# Patient Record
Sex: Female | Born: 1950
Health system: Southern US, Community
[De-identification: ages and names within clinical notes are randomized; demographics above are authoritative.]

## PROBLEM LIST (undated history)

## (undated) ENCOUNTER — Emergency Department (HOSPITAL_COMMUNITY): Admission: EM | Payer: Medicare HMO

## (undated) DIAGNOSIS — IMO0001 Reserved for inherently not codable concepts without codable children: Secondary | ICD-10-CM

## (undated) DIAGNOSIS — I219 Acute myocardial infarction, unspecified: Secondary | ICD-10-CM

## (undated) DIAGNOSIS — L729 Follicular cyst of the skin and subcutaneous tissue, unspecified: Secondary | ICD-10-CM

## (undated) DIAGNOSIS — J449 Chronic obstructive pulmonary disease, unspecified: Secondary | ICD-10-CM

## (undated) DIAGNOSIS — Q282 Arteriovenous malformation of cerebral vessels: Secondary | ICD-10-CM

## (undated) DIAGNOSIS — E785 Hyperlipidemia, unspecified: Secondary | ICD-10-CM

## (undated) DIAGNOSIS — K0889 Other specified disorders of teeth and supporting structures: Secondary | ICD-10-CM

## (undated) DIAGNOSIS — Z9189 Other specified personal risk factors, not elsewhere classified: Secondary | ICD-10-CM

## (undated) DIAGNOSIS — Z720 Tobacco use: Secondary | ICD-10-CM

## (undated) DIAGNOSIS — E039 Hypothyroidism, unspecified: Secondary | ICD-10-CM

## (undated) DIAGNOSIS — F419 Anxiety disorder, unspecified: Secondary | ICD-10-CM

## (undated) DIAGNOSIS — R42 Dizziness and giddiness: Secondary | ICD-10-CM

## (undated) DIAGNOSIS — R3989 Other symptoms and signs involving the genitourinary system: Secondary | ICD-10-CM

## (undated) DIAGNOSIS — R5383 Other fatigue: Secondary | ICD-10-CM

## (undated) DIAGNOSIS — K219 Gastro-esophageal reflux disease without esophagitis: Secondary | ICD-10-CM

## (undated) DIAGNOSIS — C801 Malignant (primary) neoplasm, unspecified: Secondary | ICD-10-CM

## (undated) DIAGNOSIS — S99929A Unspecified injury of unspecified foot, initial encounter: Secondary | ICD-10-CM

## (undated) DIAGNOSIS — R51 Headache: Secondary | ICD-10-CM

## (undated) DIAGNOSIS — I2510 Atherosclerotic heart disease of native coronary artery without angina pectoris: Secondary | ICD-10-CM

## (undated) DIAGNOSIS — L089 Local infection of the skin and subcutaneous tissue, unspecified: Secondary | ICD-10-CM

## (undated) DIAGNOSIS — R0600 Dyspnea, unspecified: Secondary | ICD-10-CM

## (undated) DIAGNOSIS — I10 Essential (primary) hypertension: Secondary | ICD-10-CM

## (undated) DIAGNOSIS — F32A Depression, unspecified: Secondary | ICD-10-CM

## (undated) DIAGNOSIS — R918 Other nonspecific abnormal finding of lung field: Secondary | ICD-10-CM

## (undated) DIAGNOSIS — Z5189 Encounter for other specified aftercare: Secondary | ICD-10-CM

## (undated) DIAGNOSIS — N390 Urinary tract infection, site not specified: Secondary | ICD-10-CM

## (undated) DIAGNOSIS — R58 Hemorrhage, not elsewhere classified: Secondary | ICD-10-CM

## (undated) HISTORY — DX: Arteriovenous malformation of cerebral vessels: Q28.2

## (undated) HISTORY — PX: WISDOM TOOTH EXTRACTION: SHX21

## (undated) HISTORY — DX: Other fatigue: R53.83

## (undated) HISTORY — PX: CORONARY STENT PLACEMENT: SHX1402

## (undated) HISTORY — DX: Tobacco use: Z72.0

## (undated) HISTORY — DX: Unspecified injury of unspecified foot, initial encounter: S99.929A

## (undated) HISTORY — DX: Urinary tract infection, site not specified: N39.0

## (undated) HISTORY — DX: Atherosclerotic heart disease of native coronary artery without angina pectoris: I25.10

## (undated) HISTORY — DX: Dizziness and giddiness: R42

## (undated) HISTORY — DX: Local infection of the skin and subcutaneous tissue, unspecified: L08.9

## (undated) HISTORY — DX: Hypothyroidism, unspecified: E03.9

## (undated) HISTORY — PX: COLONOSCOPY: SHX174

## (undated) HISTORY — DX: Follicular cyst of the skin and subcutaneous tissue, unspecified: L72.9

## (undated) HISTORY — PX: EYE SURGERY: SHX253

## (undated) HISTORY — DX: Reserved for inherently not codable concepts without codable children: IMO0001

## (undated) HISTORY — DX: Essential (primary) hypertension: I10

## (undated) HISTORY — DX: Other symptoms and signs involving the genitourinary system: R39.89

## (undated) HISTORY — DX: Anxiety disorder, unspecified: F41.9

## (undated) HISTORY — DX: Hyperlipidemia, unspecified: E78.5

## (undated) HISTORY — DX: Hemorrhage, not elsewhere classified: R58

## (undated) HISTORY — PX: UPPER GI ENDOSCOPY: SHX6162

## (undated) HISTORY — DX: Other specified disorders of teeth and supporting structures: K08.89

---

## 1987-11-07 HISTORY — PX: HEMORRHOID SURGERY: SHX153

## 2000-06-27 ENCOUNTER — Ambulatory Visit (HOSPITAL_COMMUNITY): Admission: RE | Admit: 2000-06-27 | Discharge: 2000-06-27 | Payer: Self-pay | Admitting: Internal Medicine

## 2000-06-27 ENCOUNTER — Encounter: Payer: Self-pay | Admitting: Internal Medicine

## 2000-07-02 ENCOUNTER — Encounter: Payer: Self-pay | Admitting: Internal Medicine

## 2000-07-02 ENCOUNTER — Encounter: Admission: RE | Admit: 2000-07-02 | Discharge: 2000-07-02 | Payer: Self-pay | Admitting: Internal Medicine

## 2006-05-16 ENCOUNTER — Inpatient Hospital Stay (HOSPITAL_COMMUNITY): Admission: EM | Admit: 2006-05-16 | Discharge: 2006-05-19 | Payer: Self-pay | Admitting: Emergency Medicine

## 2006-11-06 HISTORY — PX: CORONARY ARTERY BYPASS GRAFT: SHX141

## 2007-01-01 ENCOUNTER — Inpatient Hospital Stay (HOSPITAL_COMMUNITY): Admission: EM | Admit: 2007-01-01 | Discharge: 2007-01-03 | Payer: Self-pay | Admitting: Emergency Medicine

## 2007-01-02 HISTORY — PX: CARDIAC CATHETERIZATION: SHX172

## 2007-01-14 ENCOUNTER — Ambulatory Visit: Payer: Self-pay | Admitting: Thoracic Surgery (Cardiothoracic Vascular Surgery)

## 2007-02-04 ENCOUNTER — Ambulatory Visit: Payer: Self-pay | Admitting: Thoracic Surgery (Cardiothoracic Vascular Surgery)

## 2007-02-05 ENCOUNTER — Ambulatory Visit: Payer: Self-pay | Admitting: Thoracic Surgery (Cardiothoracic Vascular Surgery)

## 2007-02-06 ENCOUNTER — Inpatient Hospital Stay (HOSPITAL_COMMUNITY)
Admission: RE | Admit: 2007-02-06 | Discharge: 2007-02-11 | Payer: Self-pay | Admitting: Thoracic Surgery (Cardiothoracic Vascular Surgery)

## 2007-02-06 ENCOUNTER — Ambulatory Visit: Payer: Self-pay | Admitting: Thoracic Surgery (Cardiothoracic Vascular Surgery)

## 2007-03-04 ENCOUNTER — Ambulatory Visit: Payer: Self-pay | Admitting: Thoracic Surgery (Cardiothoracic Vascular Surgery)

## 2010-11-28 ENCOUNTER — Encounter: Payer: Self-pay | Admitting: Cardiovascular Disease

## 2011-02-28 ENCOUNTER — Other Ambulatory Visit: Payer: Self-pay | Admitting: Cardiovascular Disease

## 2011-03-01 ENCOUNTER — Telehealth: Payer: Self-pay | Admitting: *Deleted

## 2011-03-01 DIAGNOSIS — E039 Hypothyroidism, unspecified: Secondary | ICD-10-CM

## 2011-03-01 DIAGNOSIS — E785 Hyperlipidemia, unspecified: Secondary | ICD-10-CM

## 2011-03-01 DIAGNOSIS — I2510 Atherosclerotic heart disease of native coronary artery without angina pectoris: Secondary | ICD-10-CM

## 2011-03-01 MED ORDER — METOPROLOL TARTRATE 50 MG PO TABS: 50.0000 mg | ORAL_TABLET | Freq: Every day | ORAL | Status: DC

## 2011-03-01 NOTE — Telephone Encounter (Signed)
Been over one year since app. Pt called and Appointment scheduled with labs and ekg order, pt has no insurance and stated she will set up a payment plan. Fax received from pharmacy. Refill completed. Alfonso Ramus RN

## 2011-03-22 ENCOUNTER — Encounter: Payer: Self-pay | Admitting: Cardiovascular Disease

## 2011-03-22 DIAGNOSIS — R42 Dizziness and giddiness: Secondary | ICD-10-CM | POA: Insufficient documentation

## 2011-03-22 DIAGNOSIS — E785 Hyperlipidemia, unspecified: Secondary | ICD-10-CM | POA: Insufficient documentation

## 2011-03-22 DIAGNOSIS — R0602 Shortness of breath: Secondary | ICD-10-CM | POA: Insufficient documentation

## 2011-03-22 DIAGNOSIS — R110 Nausea: Secondary | ICD-10-CM | POA: Insufficient documentation

## 2011-03-22 DIAGNOSIS — I10 Essential (primary) hypertension: Secondary | ICD-10-CM | POA: Insufficient documentation

## 2011-03-22 DIAGNOSIS — E039 Hypothyroidism, unspecified: Secondary | ICD-10-CM | POA: Insufficient documentation

## 2011-03-22 DIAGNOSIS — R5383 Other fatigue: Secondary | ICD-10-CM | POA: Insufficient documentation

## 2011-03-22 DIAGNOSIS — R079 Chest pain, unspecified: Secondary | ICD-10-CM | POA: Insufficient documentation

## 2011-03-22 DIAGNOSIS — F419 Anxiety disorder, unspecified: Secondary | ICD-10-CM | POA: Insufficient documentation

## 2011-03-22 DIAGNOSIS — I2510 Atherosclerotic heart disease of native coronary artery without angina pectoris: Secondary | ICD-10-CM | POA: Insufficient documentation

## 2011-03-22 DIAGNOSIS — R61 Generalized hyperhidrosis: Secondary | ICD-10-CM | POA: Insufficient documentation

## 2011-03-23 ENCOUNTER — Encounter: Payer: Self-pay | Admitting: Cardiovascular Disease

## 2011-03-23 ENCOUNTER — Ambulatory Visit (INDEPENDENT_AMBULATORY_CARE_PROVIDER_SITE_OTHER): Payer: Self-pay | Admitting: Cardiovascular Disease

## 2011-03-23 ENCOUNTER — Other Ambulatory Visit (INDEPENDENT_AMBULATORY_CARE_PROVIDER_SITE_OTHER): Payer: Self-pay | Admitting: *Deleted

## 2011-03-23 VITALS — BP 152/90 | HR 69 | Ht 68.0 in | Wt 140.4 lb

## 2011-03-23 DIAGNOSIS — E785 Hyperlipidemia, unspecified: Secondary | ICD-10-CM

## 2011-03-23 DIAGNOSIS — F419 Anxiety disorder, unspecified: Secondary | ICD-10-CM

## 2011-03-23 DIAGNOSIS — I2510 Atherosclerotic heart disease of native coronary artery without angina pectoris: Secondary | ICD-10-CM

## 2011-03-23 DIAGNOSIS — F411 Generalized anxiety disorder: Secondary | ICD-10-CM

## 2011-03-23 DIAGNOSIS — E039 Hypothyroidism, unspecified: Secondary | ICD-10-CM

## 2011-03-23 MED ORDER — LOSARTAN POTASSIUM 100 MG PO TABS: 100.0000 mg | ORAL_TABLET | Freq: Every day | ORAL | Status: DC

## 2011-03-23 MED ORDER — POTASSIUM CHLORIDE ER 10 MEQ PO TBCR: 10.0000 meq | EXTENDED_RELEASE_TABLET | Freq: Every day | ORAL | Status: DC

## 2011-03-23 MED ORDER — SIMVASTATIN 80 MG PO TABS: 40.0000 mg | ORAL_TABLET | Freq: Every day | ORAL | Status: DC

## 2011-03-23 MED ORDER — HYDROCHLOROTHIAZIDE 25 MG PO TABS: 25.0000 mg | ORAL_TABLET | Freq: Every day | ORAL | Status: DC

## 2011-03-23 MED ORDER — NITROGLYCERIN 0.4 MG SL SUBL: 0.4000 mg | SUBLINGUAL_TABLET | SUBLINGUAL | Status: DC | PRN

## 2011-03-23 NOTE — Assessment & Plan Note (Signed)
She's been taking Xanax tablets that she buys from a friend. I refuse to Give her any Xanax.

## 2011-03-23 NOTE — Progress Notes (Signed)
Jill [name] Date of Birth  04/17/1951 [facility] [address]     [address] [place], [place]  [address] [phone]  Fax  [phone]  History of Present Illness:  Jill [name] is a middle-age female with a history of coronary artery disease. She status post CABG in 2000. She continues to smoke at least one to 2 packs of cigarettes a day. She takes Xanax that she buys off the street. She takes her medications as prescribed.  She's not been back to her medical Dr.  She complains of having left-sided chest pain. These episodes of chest pain are described as a stabbing-like sensation. They're not similar to a previous episodes of angina. She refuses to have a stress test. She states that she cannot afford to have a stress test.  She's under a lot of stress at home and thinks that these  episodes of chest pain may be related to her anxiety.  Current Outpatient Prescriptions on File Prior to Visit  Medication Sig Dispense Refill  . aspirin 81 MG tablet Take 81 mg by mouth daily.        . hydrochlorothiazide 25 MG tablet Take 25 mg by mouth daily.        Marland Kitchen levothyroxine (SYNTHROID, LEVOTHROID) 125 MCG tablet Take 100 mcg by mouth daily.       Marland Kitchen losartan (COZAAR) 100 MG tablet Take 100 mg by mouth daily.        . metoprolol (LOPRESSOR) 50 MG tablet TAKE ONE TABLET BY MOUTH EVERY DAY  30 tablet  0  . potassium chloride (K-DUR) 10 MEQ tablet Take 10 mEq by mouth daily.        . simvastatin (ZOCOR) 80 MG tablet Take 80 mg by mouth at bedtime.        Marland Kitchen DISCONTD: ALPRAZolam (XANAX) 0.5 MG tablet Take 0.5 mg by mouth at bedtime as needed.          Allergies  Allergen Reactions  . Ace Inhibitors   . Sulfa Drugs Cross Reactors     Past Medical History  Diagnosis Date  . Coronary artery disease   . Hyperlipidemia   . Hypertension   . Chest pain   . SOB (shortness of breath)   . Nausea   . Diaphoresis   . Fatigue   . Dizziness   . Anxiety   .  Hypothyroidism     Past Surgical History  Procedure Date  . Cardiac catheterization 01/02/2007    IT REVEALS MILD INFERIOR WALL HYPOKINESIS. THE EJECTION FRACTION IS AROUND 50%  . Coronary artery bypass graft 2000    History  Smoking status  . Current Everyday Smoker -- 2.0 packs/day  Smokeless tobacco  . Not on file    History  Alcohol Use No    History reviewed. No pertinent family history.  Reviw of Systems:  Reviewed in the HPI.  All other systems are negative.  Physical Exam: BP 152/90  Pulse 69  Ht 5\' 8"  (1.727 m)  Wt 140 lb 6.4 oz (63.685 kg)  BMI 21.35 kg/m2 The patient is alert and oriented x 3.  The mood and affect are normal.  The skin is warm and dry.  Color is normal.  The HEENT exam reveals that the sclera are nonicteric.  The mucous membranes are moist.  The carotids are 2+ without bruits.  There is no thyromegaly.  There is no JVD.  The lungs are clear.  The chest wall  is non tender.  The heart exam reveals a regular rate with a normal S1 and S2.  There are no murmurs, gallops, or rubs.  The PMI is not displaced.   Abdominal exam reveals good bowel sounds.  There is no guarding or rebound.  There is no hepatosplenomegaly or tenderness.  There are no masses.  Exam of the legs reveal no clubbing, cyanosis, or edema.  The legs are without rashes.  The distal pulses are intact.  Cranial nerves II - XII are intact.  Motor and sensory functions are intact.  The gait is normal.  ECG: Normal sinus rhythm. She has a nonspecific intraventricular conduction delay. She has a lateral myocardial infarction. Assessment / Plan:

## 2011-03-23 NOTE — Assessment & Plan Note (Signed)
Jill [name] continues to be somewhat noncompliant. She does take her medications but she continues to smoke. She refuses to have a stress test. She takes medicines that were prescribed to her.  I've refilled her prescription for nitroglycerin. I've asked her to call us if she has any recurrent episodes of chest pain. I've encouraged her to stop smoking which would help her physically but would also give her more spending money. I'll see her again in One year.

## 2011-03-23 NOTE — Assessment & Plan Note (Signed)
Jill [name] has a history of hypothyroidism. She was previously on 0.25 mg of Synthroid a day. She ran out of her Synthroid and started taking some of her husbands Jill [name] 3 tablets. I've told her that this was not a good way to manage her hypothyroidism. We will check a TSH today. She really needs to get a general medical Dr.

## 2011-03-24 LAB — TSH: TSH: 2.25 u[IU]/mL (ref 0.35–5.50)

## 2011-03-24 LAB — BASIC METABOLIC PANEL
BUN: 10 mg/dL (ref 6–23)
Calcium: 9.6 mg/dL (ref 8.4–10.5)
Creatinine, Ser: 1 mg/dL (ref 0.4–1.2)
GFR: 59.02 mL/min — ABNORMAL LOW (ref >60.00)
Glucose, Bld: 90 mg/dL (ref 70–99)
Sodium: 140 mEq/L (ref 135–145)

## 2011-03-24 LAB — HEPATIC FUNCTION PANEL
AST: 19 U/L (ref 0–37)
Alkaline Phosphatase: 43 U/L (ref 39–117)
Bilirubin, Direct: 0 mg/dL (ref 0.0–0.3)
Total Bilirubin: 0.3 mg/dL (ref 0.3–1.2)

## 2011-03-27 ENCOUNTER — Telehealth: Payer: Self-pay | Admitting: Cardiovascular Disease

## 2011-03-27 NOTE — Telephone Encounter (Signed)
Pt calling regarding her lab work-TSH level;  She states Dr. Elease Hashimoto will be monitoring and regulating her Thyroid levels.  Explained I would discuss with the doctor and call her tomorrow when he is back in the office.

## 2011-03-27 NOTE — Telephone Encounter (Signed)
Please call her with her most recent rest results.

## 2011-03-27 NOTE — Progress Notes (Signed)
Pt called and informed of potassium rich foods and to decrease these items.Patient called with lab results. Pt verbalized understanding. Alfonso Ramus RN

## 2011-03-28 ENCOUNTER — Other Ambulatory Visit: Payer: Self-pay | Admitting: *Deleted

## 2011-03-28 DIAGNOSIS — E039 Hypothyroidism, unspecified: Secondary | ICD-10-CM

## 2011-03-28 MED ORDER — LEVOTHYROXINE SODIUM 100 MCG PO TABS: 100.0000 ug | ORAL_TABLET | Freq: Every day | ORAL | Status: DC

## 2011-03-28 NOTE — Telephone Encounter (Signed)
Refill done, needs lab in 6wks, pt informed,Jill [name]/ranger

## 2011-04-17 ENCOUNTER — Telehealth: Payer: Self-pay | Admitting: Cardiovascular Disease

## 2011-04-17 MED ORDER — METOPROLOL TARTRATE 50 MG PO TABS: 50.0000 mg | ORAL_TABLET | Freq: Every day | ORAL | Status: DC

## 2011-04-17 NOTE — Telephone Encounter (Signed)
Called in wanting a refill of her Metoprolol at the [facility] on [address] [id number].

## 2011-04-17 NOTE — Telephone Encounter (Signed)
Patient request refill. done Alfonso Ramus RN

## 2011-05-12 ENCOUNTER — Inpatient Hospital Stay (HOSPITAL_COMMUNITY)
Admission: EM | Admit: 2011-05-12 | Discharge: 2011-05-14 | DRG: 313 | Disposition: A | Discharge status: Home / Self Care | Payer: Self-pay | Attending: Cardiovascular Disease | Admitting: Cardiovascular Disease

## 2011-05-12 ENCOUNTER — Emergency Department (HOSPITAL_COMMUNITY): Payer: Self-pay

## 2011-05-12 DIAGNOSIS — E785 Hyperlipidemia, unspecified: Secondary | ICD-10-CM | POA: Diagnosis present

## 2011-05-12 DIAGNOSIS — Z951 Presence of aortocoronary bypass graft: Secondary | ICD-10-CM

## 2011-05-12 DIAGNOSIS — K3189 Other diseases of stomach and duodenum: Secondary | ICD-10-CM | POA: Diagnosis present

## 2011-05-12 DIAGNOSIS — I447 Left bundle-branch block, unspecified: Secondary | ICD-10-CM | POA: Diagnosis present

## 2011-05-12 DIAGNOSIS — I2510 Atherosclerotic heart disease of native coronary artery without angina pectoris: Secondary | ICD-10-CM | POA: Diagnosis present

## 2011-05-12 DIAGNOSIS — Z888 Allergy status to other drugs, medicaments and biological substances status: Secondary | ICD-10-CM

## 2011-05-12 DIAGNOSIS — Z79899 Other long term (current) drug therapy: Secondary | ICD-10-CM

## 2011-05-12 DIAGNOSIS — Z7982 Long term (current) use of aspirin: Secondary | ICD-10-CM

## 2011-05-12 DIAGNOSIS — Z9861 Coronary angioplasty status: Secondary | ICD-10-CM

## 2011-05-12 DIAGNOSIS — F411 Generalized anxiety disorder: Secondary | ICD-10-CM | POA: Diagnosis present

## 2011-05-12 DIAGNOSIS — E039 Hypothyroidism, unspecified: Secondary | ICD-10-CM | POA: Diagnosis present

## 2011-05-12 DIAGNOSIS — F17200 Nicotine dependence, unspecified, uncomplicated: Secondary | ICD-10-CM | POA: Diagnosis present

## 2011-05-12 DIAGNOSIS — Z882 Allergy status to sulfonamides status: Secondary | ICD-10-CM

## 2011-05-12 DIAGNOSIS — R0789 Other chest pain: Principal | ICD-10-CM | POA: Diagnosis present

## 2011-05-12 DIAGNOSIS — Z880 Allergy status to penicillin: Secondary | ICD-10-CM

## 2011-05-12 DIAGNOSIS — I10 Essential (primary) hypertension: Secondary | ICD-10-CM | POA: Diagnosis present

## 2011-05-12 DIAGNOSIS — I252 Old myocardial infarction: Secondary | ICD-10-CM

## 2011-05-12 DIAGNOSIS — R079 Chest pain, unspecified: Secondary | ICD-10-CM

## 2011-05-12 LAB — POCT I-STAT, CHEM 8
BUN: 8 mg/dL (ref 6–23)
Calcium, Ion: 1.12 mmol/L (ref 1.12–1.32)
Chloride: 103 mEq/L (ref 96–112)
Creatinine, Ser: 1.1 mg/dL (ref 0.50–1.10)
TCO2: 27 mmol/L (ref 0–100)

## 2011-05-12 LAB — CBC
Hemoglobin: 13.6 g/dL (ref 12.0–15.0)
MCH: 32.2 pg (ref 26.0–34.0)
MCHC: 34.3 g/dL (ref 30.0–36.0)
MCV: 93.6 fL (ref 78.0–100.0)

## 2011-05-12 LAB — DIFFERENTIAL
Basophils Relative: 1 % (ref 0–1)
Eosinophils Absolute: 0.1 10*3/uL (ref 0.0–0.7)
Lymphs Abs: 1.4 10*3/uL (ref 0.7–4.0)
Monocytes Absolute: 0.6 10*3/uL (ref 0.1–1.0)
Monocytes Relative: 7 % (ref 3–12)
Neutro Abs: 6.9 10*3/uL (ref 1.7–7.7)

## 2011-05-13 LAB — BASIC METABOLIC PANEL
CO2: 29 mEq/L (ref 19–32)
Chloride: 105 mEq/L (ref 96–112)
Glucose, Bld: 91 mg/dL (ref 70–99)
Potassium: 3.5 mEq/L (ref 3.5–5.1)
Sodium: 144 mEq/L (ref 135–145)

## 2011-05-13 LAB — CBC
Platelets: 224 10*3/uL (ref 150–400)
RBC: 4.21 MIL/uL (ref 3.87–5.11)
RDW: 12.9 % (ref 11.5–15.5)
WBC: 9.9 10*3/uL (ref 4.0–10.5)

## 2011-05-13 LAB — CK TOTAL AND CKMB (NOT AT ARMC)
Relative Index: 2.2 (ref 0.0–2.5)
Total CK: 106 U/L (ref 7–177)
Total CK: 105 U/L (ref 7–177)

## 2011-05-13 LAB — CARDIAC PANEL(CRET KIN+CKTOT+MB+TROPI)
CK, MB: 2.2 ng/mL (ref 0.3–4.0)
Troponin I: <0.3 ng/mL (ref <0.30)

## 2011-05-13 LAB — LIPID PANEL
HDL: 43 mg/dL (ref >39)
LDL Cholesterol: 69 mg/dL (ref 0–99)
Total CHOL/HDL Ratio: 2.8 RATIO
Triglycerides: 45 mg/dL (ref <150)
VLDL: 9 mg/dL (ref 0–40)

## 2011-05-13 LAB — TROPONIN I: Troponin I: <0.3 ng/mL (ref <0.30)

## 2011-05-13 LAB — HEPARIN LEVEL (UNFRACTIONATED): Heparin Unfractionated: 0.41 IU/mL (ref 0.30–0.70)

## 2011-05-14 ENCOUNTER — Inpatient Hospital Stay (HOSPITAL_COMMUNITY): Payer: Self-pay

## 2011-05-14 DIAGNOSIS — R079 Chest pain, unspecified: Secondary | ICD-10-CM

## 2011-05-14 LAB — BASIC METABOLIC PANEL
CO2: 31 mEq/L (ref 19–32)
Calcium: 8.9 mg/dL (ref 8.4–10.5)
GFR calc Af Amer: >60 mL/min (ref >60)
Sodium: 142 mEq/L (ref 135–145)

## 2011-05-14 LAB — CBC
Hemoglobin: 14.2 g/dL (ref 12.0–15.0)
MCHC: 34.2 g/dL (ref 30.0–36.0)
Platelets: 225 10*3/uL (ref 150–400)
RBC: 4.36 MIL/uL (ref 3.87–5.11)

## 2011-05-14 LAB — HEPARIN LEVEL (UNFRACTIONATED): Heparin Unfractionated: 0.45 IU/mL (ref 0.30–0.70)

## 2011-05-14 MED ORDER — TECHNETIUM TC 99M TETROFOSMIN IV KIT
30.0000 | PACK | Freq: Once | INTRAVENOUS | Status: AC | PRN
  Administered 2011-05-14: 30 via INTRAVENOUS

## 2011-05-14 MED ORDER — TECHNETIUM TC 99M TETROFOSMIN IV KIT
10.0000 | PACK | Freq: Once | INTRAVENOUS | Status: AC | PRN
  Administered 2011-05-14: 10 via INTRAVENOUS

## 2011-05-15 ENCOUNTER — Other Ambulatory Visit (HOSPITAL_COMMUNITY): Payer: Self-pay

## 2011-05-20 NOTE — H&P (Signed)
NAME:  Jill [name], Jill [name] NO.:  [id number]  MEDICAL RECORD NO.:  [id number]  LOCATION:  [facility]                         FACILITY:  [facility]  PHYSICIAN:  Rollene Rotunda, MD, FACCDATE OF BIRTH:  [birth year]  DATE OF ADMISSION:  05/13/2011 DATE OF DISCHARGE:                             HISTORY & PHYSICAL   PRIMARY CARE PHYSICIAN:  None.  CARDIOLOGIST:  Vesta Mixer, M.D.  REASON FOR PRESENTATION:  Evaluate the patient with chest pain.  HISTORY OF PRESENT ILLNESS:  The patient is a 60 year old white female with known coronary disease.  She saw Dr. Elease Hashimoto in May.  She was having some chest discomfort at that time but refused stress testing.  She continues to smoke cigarettes.  She developed chest discomfort at about 10 o'clock this evening.  She got back from [place].  She was eating some [place] fried chicken.  She had had a particularly stressful day in her household.  She developed some chest discomfort.  She said it was a heaviness.  It was about 2/10.  She did call EMS.  She had left bundle- branch block on EKG, but as I look back this was there in May as well. She was brought to the emergency room.  Cardiac enzymes have been negative so far.  She has had no further chest discomfort and it seemed to resolve spontaneously.  She did not report any nausea, vomiting or diaphoresis.  She had some slight dizziness.  She did not report any shortness of breath.  She has had no PND or orthopnea.  She will occasionally notice palpitations.  She denied any presyncope or syncope. She says she is active during the day.  PAST MEDICAL HISTORY:  Coronary artery disease with previous PTCA of the distal right coronary lesion and CABG as described below, dyslipidemia, hypertension, hypothyroidism, anxiety, ongoing tobacco abuse, retroperitoneal bleeding status post catheterization.  PAST SURGICAL HISTORY:  CABG (LIMA to the LAD, SVG to diagonal, SVG to circumflex, sequential  OM1 and OM2, SVG to PDA), hemorrhoidectomy, tonsillectomy.  ALLERGIES/INTOLERANCES:  Vague distant childhood allergy to PENICILLIN, SULFA, ACE INHIBITORS.  MEDICATIONS:  Aspirin 81 mg daily, Xanax, hydrochlorothiazide 25 mg daily, Synthroid 100 mcg daily, Cozaar 100 mg daily, metoprolol 50 mg daily, potassium 10 mEq daily, Zocor 80 mg daily.  SOCIAL HISTORY:  The patient smokes about a pack and half cigarettes per day and has done so for all of her adult life.  She lives at home with her son, several children and grandchildren.  FAMILY HISTORY:  Noncontributory for early coronary artery disease.  REVIEW OF SYSTEMS:  As stated in the HPI and otherwise negative for all other systems.  PHYSICAL EXAMINATION:  GENERAL:  The patient is in no distress. VITAL SIGNS:  Blood pressure 150/79, heart rate 64 and regular, afebrile. HEENT:  Eyes are unremarkable.  Pupils equal, round, reactive to light. Fundi not visualized.  Oral mucosa unremarkable.  Poor dentition. Neck:  No jugular distention at 45 degrees, carotid upstroke brisk and symmetric.  No bruits, thyromegaly. LYMPHATICS:  No cervical, axillary, inguinal adenopathy. LUNGS:  Decreased breath sounds but no wheezing or crackles. BACK:  No costovertebral angle tenderness. CHEST:  Well-healed  sternotomy scar. HEART:  PMI not displaced or sustained, S1 and S2 within normal limits. No S3, no S4.  No clicks, no rubs, no murmurs. ABDOMEN:  Flat, positive bowel sounds, normal in frequency and pitch, no bruits.  No rebound, guarding.  No midline pulsatile mass.  No hepatomegaly, no splenomegaly. SKIN:  No rashes, no nodules. EXTREMITIES:  2+ pulses throughout.  No edema, no cyanosis, no clubbing. NEURO:  Oriented to person, place and time.  Cranial nerves II-XII grossly intact.  Motor grossly intact.  EKG:  Sinus rhythm, rate 102, left bundle-branch block.  LABORATORY DATA:  Sodium 142, potassium 2.1, BUN 8, creatinine 1.1.  WBC 9.1,  hemoglobin 13.6, platelets 231.  Chest x-ray:  No acute disease.  ASSESSMENT/PLAN: 1. Chest.  The patient's chest discomfort is atypical.  However, she     has significant ongoing risk factors and known coronary disease.     She will be admitted with heparin, topical nitrates, aspirin and     beta blocker.  If her enzymes are negative, I would suggest stress     perfusion imaging. 2. Tobacco.  She should have a smoking consult while she is here. 3. Hypertension.  I will continue the meds as listed and titrate     accordingly. 4. Hypothyroidism.  We can check a TSH, but I will continue her     current dose of Synthroid. 5. Dyslipidemia.  She will get a fasting lipid profile.  For now she     will continue the simvastatin 80 mg.  She has been on this dose for     a long time and seems to tolerate it, so she can continue it per     FDA suggestions.     Rollene Rotunda, MD, [facility]     JH/MEDQ  D:  05/13/2011  T:  05/13/2011  Job:  [id number]  Electronically Signed by Rollene Rotunda MD [facility] on 05/20/2011 04:03:26 PM

## 2011-05-29 ENCOUNTER — Encounter: Payer: Self-pay | Admitting: Cardiovascular Disease

## 2011-05-30 ENCOUNTER — Encounter: Payer: Self-pay | Admitting: Nurse Practitioner

## 2011-05-30 ENCOUNTER — Ambulatory Visit (INDEPENDENT_AMBULATORY_CARE_PROVIDER_SITE_OTHER): Payer: Self-pay | Admitting: Nurse Practitioner

## 2011-05-30 VITALS — BP 116/78 | HR 60 | Wt 133.8 lb

## 2011-05-30 DIAGNOSIS — E039 Hypothyroidism, unspecified: Secondary | ICD-10-CM

## 2011-05-30 DIAGNOSIS — I2510 Atherosclerotic heart disease of native coronary artery without angina pectoris: Secondary | ICD-10-CM

## 2011-05-30 MED ORDER — LEVOTHYROXINE SODIUM 100 MCG PO TABS: 100.0000 ug | ORAL_TABLET | Freq: Every day | ORAL | Status: DC

## 2011-05-30 MED ORDER — PAROXETINE HCL 20 MG PO TABS: 20.0000 mg | ORAL_TABLET | ORAL | Status: DC

## 2011-05-30 NOTE — Assessment & Plan Note (Signed)
She has had remote MI with PCI to the RCA followed by CABG in 2008. She has had recent Lexiscan that showed no ischemia and an EF of 70%. I think her symptoms are more stress related. She wants to go back on her Paxil. She is going to try and wean herself off the Xanax. I have given her a RX for Paxil 20 mg daily with only 3 refills. It is $4 at [facility]. I encouraged smoking cessation, but this is helping her cope at this time. She is trying to establish primary care at [facility]. We will see her back as needed.

## 2011-05-30 NOTE — Patient Instructions (Signed)
I have sent your prescriptions for your thyroid medicine and Paxil to the drug store We will see you back as needed I congratulate you for trying to get off your xanax. Try taking every other day for a couple of weeks, then every 3rd day, then every 4th day, etc.

## 2011-05-30 NOTE — Assessment & Plan Note (Signed)
I refilled her thyroid medicine today. She is to follow up with [facility].

## 2011-05-30 NOTE — Progress Notes (Signed)
Jill [name] Date of Birth: 06-14-1951   History of Present Illness: Jill [name] is seen today for a post hospital visit. She is seen for Dr. Elease Hashimoto. She was admitted earlier this month with atypical chest pain. She had a negative lexiscan. No ischemia and EF was 70%. She was treated with proton pump inhibitor. She is using the OTC Prilosec instead of Protonix. She continues to have some atypical symptoms. She is under lots of stress. Her husband is drinking. Her daughter and grandchildren are living with her. They are acting out. She will have spells where her lips get tingling as do her fingers and toes. She continues to smoke. She has ran out of her thyroid medicine. She has no primary care doctor and is trying to establish care at [facility] but is on the waiting list. She has basically no money and does not qualify for Medicaid. She has tried to stop her Xanax cold [place] over the weekend and had withdrawal. She does not have a prescription for this. She wants off of them. She has no money to see psyche.   Current Outpatient Prescriptions on File Prior to Visit  Medication Sig Dispense Refill  . ALPRAZolam (XANAX) 0.5 MG tablet Take 0.5 mg by mouth at bedtime as needed.        Marland Kitchen aspirin 81 MG tablet Take 81 mg by mouth daily.        . hydrochlorothiazide 25 MG tablet Take 1 tablet (25 mg total) by mouth daily.  90 tablet  3  . losartan (COZAAR) 100 MG tablet Take 1 tablet (100 mg total) by mouth daily.  90 tablet  3  . metoprolol (LOPRESSOR) 50 MG tablet Take 1 tablet (50 mg total) by mouth daily.  30 tablet  5  . nitroGLYCERIN (NITROSTAT) 0.4 MG SL tablet Place 1 tablet (0.4 mg total) under the tongue every 5 (five) minutes as needed for chest pain.  25 tablet  12  . potassium chloride (K-DUR) 10 MEQ tablet Take 1 tablet (10 mEq total) by mouth daily.  90 tablet  3  . simvastatin (ZOCOR) 80 MG tablet Take 0.5 tablets (40 mg total) by mouth at bedtime.  30 tablet  12  . DISCONTD:  levothyroxine (SYNTHROID, LEVOTHROID) 100 MCG tablet Take 1 tablet (100 mcg total) by mouth daily.  30 tablet  1  . PARoxetine (PAXIL) 20 MG tablet Take 1 tablet (20 mg total) by mouth every morning.  30 tablet  3    Allergies  Allergen Reactions  . Ace Inhibitors   . Sulfa Drugs Cross Reactors     Past Medical History  Diagnosis Date  . Coronary artery disease     Prior inferior MI with stent to RCA, s/p CABG in 2008  . Hyperlipidemia   . Hypertension   . SOB (shortness of breath)   . Fatigue   . Dizziness   . Anxiety   . Hypothyroidism   . Normal nuclear stress test Ju;y 2012    No ischemia. EF 70%; fixed defect involving septum, inferoseptal and inferior wall  . Tobacco abuse   . Retroperitoneal bleeding     Following cardiac cath    Past Surgical History  Procedure Date  . Cardiac catheterization 01/02/2007    IT REVEALS MILD INFERIOR WALL HYPOKINESIS. THE EJECTION FRACTION IS AROUND 50%  . Coronary artery bypass graft 2008    LIMA to LAD, SVG to DX, SVG to LCX & SVG to OM 1 &  2, and SVG to PD  . Coronary stent placement     Remote past stent to RCA    History  Smoking status  . Current Everyday Smoker -- 0.5 packs/day  Smokeless tobacco  . Not on file    History  Alcohol Use No    Family History  Problem Relation Age of Onset  . Heart disease Neg Hx     Review of Systems: The review of systems is as above.  All other systems were reviewed and are negative.  Physical Exam: BP 116/78  Pulse 60  Wt 133 lb 12.8 oz (60.691 kg) Patient is in no acute distress. She is a little anxious. Skin is warm and dry. Color is normal.  HEENT is unremarkable except for very poor dentition. Normocephalic/atraumatic. PERRL. Sclera are nonicteric. Neck is supple. No masses. No JVD. Lungs are clear. Cardiac exam shows a regular rate and rhythm. Abdomen is soft. Extremities are without edema. Gait and ROM are intact. No gross neurologic deficits noted.  LABORATORY  DATA:   Assessment / Plan:

## 2011-06-21 ENCOUNTER — Encounter: Payer: Self-pay | Admitting: Family Medicine

## 2011-06-21 ENCOUNTER — Ambulatory Visit (INDEPENDENT_AMBULATORY_CARE_PROVIDER_SITE_OTHER): Payer: Self-pay | Admitting: Family Medicine

## 2011-06-21 DIAGNOSIS — I10 Essential (primary) hypertension: Secondary | ICD-10-CM

## 2011-06-21 DIAGNOSIS — F411 Generalized anxiety disorder: Secondary | ICD-10-CM

## 2011-06-21 DIAGNOSIS — F419 Anxiety disorder, unspecified: Secondary | ICD-10-CM

## 2011-06-21 MED ORDER — ALPRAZOLAM 0.5 MG PO TABS: 0.5000 mg | ORAL_TABLET | Freq: Every evening | ORAL | Status: DC | PRN

## 2011-06-21 MED ORDER — LEVOTHYROXINE SODIUM 100 MCG PO TABS: 100.0000 ug | ORAL_TABLET | Freq: Every day | ORAL | Status: DC

## 2011-06-21 MED ORDER — PAROXETINE HCL 40 MG PO TABS: 40.0000 mg | ORAL_TABLET | ORAL | Status: DC

## 2011-06-21 NOTE — Progress Notes (Signed)
  Subjective:    Patient ID: Jill [name], female    DOB: 1951-07-09, 60 y.o.   MRN: [id number]  HPI Has previously been see Dr. Elease Hashimoto, cardiology who has been assisting with her care.  Here to establish with PCP.  Anxiety: Struggled since teen years.  Notes episodes of panic attacks since then.  Off and on Paxil and xanax.  Currenlty getting xanax and paxil 20 mg for the past month.  Has been in therapy in the 1990's, felt it didn't help.  Reports getting panic attacks daily until had a large one which she went to the hospital for in July, notes "little" panic attacks 2-3 times per week.  Takes 0.25 xanax at night.  Paxil has helped reduce the number and panic attacks.  Denies signs of depressions much as crying.  Some hopelessness with the economy.   Has had thoughts of suicide- driving off a bridge.  Last had those thgouhts several days before big panic attack.  Has not thought about it since being on paxil.  HYPERTENSION  BP Readings from Last 3 Encounters:  06/21/11 160/90  05/30/11 116/78  03/23/11 152/90    Hypertension ROS: taking medications as instructed, no medication side effects noted, home BP monitoring in range of 130's systolic over 80's diastolic, no chest pain on exertion, no dyspnea on exertion, no swelling of ankles and no intermittent claudication symptoms.        Review of Systems Gen:  No fever, chills, unexplained weight loss Ears:  No hearing loss, ringing Eyes: No vision changes, double vision, eye drainage Nose:  No rhinorrhea, congestion Throat:  No sore throat or dysphagia CV:  No chest pain, palpitations, PND, dyspnea on exertion, or edema Resp: No cough, dyspnea, wheezing Abd: No nausea, vomting, diarrhea, constipation, or change in bowel color, size, or caliber. MSK: no joint pain, myalgias SKIN: no rash, changing moles GU: No dysuria, hematuria, vaginal discharge Neuro:  No headache, numbness, weakness, tingling, syncope.      Objective:   Physical Exam GEN: Alert & Oriented, No acute distress CV:  Regular Rate & Rhythm, no murmur Respiratory:  Normal work of breathing, CTAB Abd:  + BS, soft, no tenderness to palpation Ext: no pre-tibial edema Psych:  Euthymic, normal thought content, alert and oriented. No SI, HI.       Assessment & Plan:

## 2011-06-21 NOTE — Patient Instructions (Signed)
Increase paroxetine to 40 mg per day Consider therapy for stress Contact health department about MAP program for losartan Bring blood pressure to next visit Follow-up for gynecological exam

## 2011-06-22 ENCOUNTER — Encounter: Payer: Self-pay | Admitting: Family Medicine

## 2011-06-22 NOTE — Assessment & Plan Note (Signed)
BP above goal today, improved but still above goalon recheck.  Patient reports better readings at home.  Encouraged ambulatory monitoring, bring log and BP monitor to next office visit.  No changes to medications today.  Will continue metoprolol, HCTZ, losartan as per cardiology, advised patient to contact MAP to inquire about losartan.

## 2011-06-22 NOTE — Assessment & Plan Note (Signed)
Will increase paxil from 20 to 40 mg daily.  Refilled xanax #30 which should last 2 months by her report of using a half tab daily.  Discussed spacing out xanax as tolerated as paxil begins to take effect.  Gave card to call Dr. Pascal Lux for therapy.  Will follow-up in 4 weeks.

## 2011-07-04 NOTE — Discharge Summary (Signed)
Jill [name], [name] NO.:  [id number]  MEDICAL RECORD NO.:  [id number]  LOCATION:  3738                         FACILITY:  [facility]  PHYSICIAN:  Bevelyn Buckles. Bensimhon, MDDATE OF BIRTH:  Oct 04, 1951  DATE OF ADMISSION:  05/12/2011 DATE OF DISCHARGE:  05/14/2011                              DISCHARGE SUMMARY   PRIMARY CARDIOLOGIST:  Vesta Mixer, MD  REASON FOR ADMISSION:  Chest pain.  DISCHARGE DIAGNOSES: 1. Chest pain, etiology unclear.     a.     Question gastrointestinal etiology versus anxiety/stress. 2. Coronary artery disease.     a.     History of inferior myocardial infarction treated with      stenting to the right coronary artery.     b.     Status post bypass surgery in 2008, with a left internal      mammary artery to the left anterior descending, vein graft to the      diagonal, vein graft to the circumflex, and vein graft to the      obtuse marginal 1 and 2 and vein graft to the posterior descending      artery. 3. Hypertension. 4. Hyperlipidemia. 5. Hypothyroidism. 6. Tobacco abuse. 7. Anxiety. 8. History of retroperitoneal bleed after cardiac catheterization.  PROCEDURE PERFORMED DURING THIS ADMISSION:  Eugenie Birks Myoview May 14, 2011, with fixed perfusion defects involving the septum, inferoseptal wall, and inferior wall.  No convincing inducible ischemia with Lexiscan administration.  EF 70%.  Septal hypokinesis.  ALLERGIES:  Distant childhood allergy to: 1. PENICILLIN. 2. SULFA. 3. ACE INHIBITORS.  ADMISSION HISTORY:  Jill [name] is a 60 year old female with a history of CAD who last saw Dr. Elease Hashimoto in May.  Stress test has been recommended at that time due to chest pain but she refused.  While eating fried chicken during the evening on the date of admission she developed chest discomfort.  She has also had increased stress in her household.  EKG demonstrated left bundle-branch block but this appeared to be old.  She was admitted  for further evaluation and treatment.  HOSPITAL COURSE:  The patient ruled out for myocardial infarction by enzymes.  She continued to have occasional atypical chest discomfort. The patient did note that she uses Tums almost on a daily basis. Protonix was added to her medical regimen prior to discharge.  She underwent Lexiscan Myoview study on May 14, 2011.  She developed chest discomfort during the study as well as nausea and vomiting.  Her nuclear images demonstrated a fixed inferior defect but no ischemia with an EF of 70%.  Dr. Gala Romney evaluated the patient and felt she was stable enough for discharge to home.  Of note, she does describe some recent issues with her husband and we will see if the case worker can see the patient prior to discharge.  She can follow up Dr. Elease Hashimoto in the next couple of weeks.  LABORATORY DATA:  Hemoglobin 14.2, potassium 4.1, creatinine 0.89. Cardiac markers negative x4.  Total cholesterol 121, triglycerides 45, HDL 43, LDL 69.  Chest x-ray on admission no evidence of activepulmonary disease.  DISCHARGE MEDICATIONS: 1. Xanax 0.5 mg at bedtime p.r.n.  2. Protonix 40 mg daily - this is new. 3. Metoprolol tartrate 50 mg 1-1/2 tablet twice daily - of note, the     patient was on 1 tablet daily at admission. 4. Aspirin 81 mg daily. 5. Hydrochlorothiazide 25 mg daily. 6. Klor-Con 10 mEq daily. 7. Levothyroxine 100 mcg daily. 8. Losartan 100 mg daily. 9. Simvastatin 80 mg at bedtime.  ACTIVITY:  She is to increase her activity slowly.  DIET:  Low-fat, low-sodium diet.  WOUND CARE:  Not applicable.  FOLLOWUP:  She will follow up with Dr. Elease Hashimoto in 2 weeks and the office will contact her with an appointment.  Total physician PA time greater than 30 minutes since discharge.     Tereso Newcomer, PA-C   ______________________________ Bevelyn Buckles. Bensimhon, MD    SW/MEDQ  D:  05/14/2011  T:  05/15/2011  Job:  [id number]  cc:   Vesta Mixer,  M.D.  Electronically Signed by Tereso Newcomer PA-C on 06/01/2011 12:46:38 PM Electronically Signed by Arvilla Meres MD on 07/04/2011 05:13:10 PM

## 2011-09-07 ENCOUNTER — Emergency Department (HOSPITAL_COMMUNITY)
Admission: EM | Admit: 2011-09-07 | Discharge: 2011-09-08 | Disposition: A | Discharge status: Home / Self Care | Payer: Self-pay | Attending: Emergency Medicine | Admitting: Emergency Medicine

## 2011-09-07 DIAGNOSIS — I498 Other specified cardiac arrhythmias: Secondary | ICD-10-CM | POA: Insufficient documentation

## 2011-09-07 DIAGNOSIS — Z951 Presence of aortocoronary bypass graft: Secondary | ICD-10-CM | POA: Insufficient documentation

## 2011-09-07 DIAGNOSIS — W1809XA Striking against other object with subsequent fall, initial encounter: Secondary | ICD-10-CM | POA: Insufficient documentation

## 2011-09-07 DIAGNOSIS — I10 Essential (primary) hypertension: Secondary | ICD-10-CM | POA: Insufficient documentation

## 2011-09-07 DIAGNOSIS — I252 Old myocardial infarction: Secondary | ICD-10-CM | POA: Insufficient documentation

## 2011-09-07 DIAGNOSIS — I447 Left bundle-branch block, unspecified: Secondary | ICD-10-CM | POA: Insufficient documentation

## 2011-09-07 DIAGNOSIS — I671 Cerebral aneurysm, nonruptured: Secondary | ICD-10-CM | POA: Insufficient documentation

## 2011-09-07 DIAGNOSIS — E039 Hypothyroidism, unspecified: Secondary | ICD-10-CM | POA: Insufficient documentation

## 2011-09-07 DIAGNOSIS — R42 Dizziness and giddiness: Secondary | ICD-10-CM | POA: Insufficient documentation

## 2011-09-07 DIAGNOSIS — R51 Headache: Secondary | ICD-10-CM | POA: Insufficient documentation

## 2011-09-07 DIAGNOSIS — S00209A Unspecified superficial injury of unspecified eyelid and periocular area, initial encounter: Secondary | ICD-10-CM | POA: Insufficient documentation

## 2011-09-07 DIAGNOSIS — R55 Syncope and collapse: Secondary | ICD-10-CM | POA: Insufficient documentation

## 2011-09-07 LAB — BASIC METABOLIC PANEL
CO2: 27 mEq/L (ref 19–32)
Chloride: 100 mEq/L (ref 96–112)
Glucose, Bld: 120 mg/dL — ABNORMAL HIGH (ref 70–99)
Potassium: 3.1 mEq/L — ABNORMAL LOW (ref 3.5–5.1)
Sodium: 138 mEq/L (ref 135–145)

## 2011-09-07 LAB — DIFFERENTIAL
Basophils Absolute: 0.1 10*3/uL (ref 0.0–0.1)
Basophils Relative: 1 % (ref 0–1)
Eosinophils Absolute: 0.3 10*3/uL (ref 0.0–0.7)
Monocytes Relative: 8 % (ref 3–12)
Neutro Abs: 5.8 10*3/uL (ref 1.7–7.7)
Neutrophils Relative %: 49 % (ref 43–77)

## 2011-09-07 LAB — CBC
Hemoglobin: 14.5 g/dL (ref 12.0–15.0)
MCH: 33.1 pg (ref 26.0–34.0)
Platelets: 256 10*3/uL (ref 150–400)
RBC: 4.38 MIL/uL (ref 3.87–5.11)
WBC: 12 10*3/uL — ABNORMAL HIGH (ref 4.0–10.5)

## 2011-09-08 ENCOUNTER — Emergency Department (HOSPITAL_COMMUNITY): Payer: Self-pay

## 2011-09-08 LAB — POCT I-STAT TROPONIN I: Troponin i, poc: 0 ng/mL (ref 0.00–0.08)

## 2011-09-08 MED ORDER — IOHEXOL 350 MG/ML SOLN: 100.0000 mL | Freq: Once | INTRAVENOUS | Status: DC | PRN

## 2011-09-11 NOTE — Consult Note (Signed)
NAMEINES, REBEL NO.:  [id number]  MEDICAL RECORD NO.:  [id number]  LOCATION:  [facility]                         FACILITY:  [facility]  PHYSICIAN:  Coletta Memos, M.D.     DATE OF BIRTH:  02/11/1951  DATE OF CONSULTATION:  09/08/2011 DATE OF DISCHARGE:  09/08/2011                                CONSULTATION   REASON FOR CONSULTATION:  Basilar tip aneurysm.  INDICATIONS:  Jill [name] is a 60 year old woman whom today while leaving her daughter's home in time of climbing into her Jill [name] had a syncopal episode, fell striking her face.  She then heard the garage door close as the daughter thought she was gone and started knocking on it.  At that point, the daughter opened the garage door, saw her mother with blackened right eye and essentially convinced her to go to the hospital.  It turns out she had had a previous syncopal episode in the spring, but had not said anything about it until a few months afterwards when she mentioned it to her cardiologist.  She does have a history of hypertension and she is a tobacco abuser.  As a result of what possibly was a misunderstanding between she and the ER physician, head CT was ordered which was done simply because of the bruising around the eye and the syncopal episode, but that led to a finding of a probable aneurysm in the location of the basilar artery.  As a result of that, the ER physician, was advised to obtain an MRA or CTA by the radiologist.  That scan then subsequently revealed a basilar tip aneurysm.  This understanding was that the emergency room physician was under the impression that she had had a significant headache before she had the syncopal episode.  Jill [name] assured me a number of times that was not the case.  She did not have a headache before this occurred and this was the same sort of syncopal episode she had had previously.  She said she felt like she had a electric-like shock in her head, but again she  made it very clear anatomy of this thing and she notes that this was not a headache.  She said she had a headache after she fell and I was just around the region of the right eye, but otherwise she was well.  PAST MEDICAL HISTORY: 1. Hypertension. 2. Hypothyroidism. 3. She has had a myocardial infarction. 4. She has undergone bypass surgery.  SOCIAL HISTORY:  She smokes 3 packs of cigarettes a day.  She does not use alcohol.  She does not use any IV drugs.  CURRENT MEDICATIONS:  Hydrochlorothiazide, levothyroxine, Lopressor, Paxil, potassium chloride, and simvastatin.  She had taken Xanax in August secondary to what was believed to be a panic attack that actually did bring her to the emergency room.  She has an allergy to PENICILLIN.  REVIEW OF SYSTEMS:  Positive for a panic attack and previous syncopal episode.  She does have a history of coronary artery disease.  No gastrointestinal, genitourinary, skin, psychiatric, hematologic, ear, eye, nose, or throat problems.  PHYSICAL EXAMINATION:  VITAL SIGNS:  Blood pressure 144/74, pulse of 53, respiratory  rate of 16, and 95% saturation on air.  She has had blood pressure a high of 189/100.  She did miss her nighttime dose of metoprolol, which she does take twice a day. GENERAL:  She is alert.  She is oriented x4.  Speech is clear.  It is fluent.  She has normal fund of knowledge and is following all commands. HEENT:  She does have ecchymotic region around the right periorbital area.  She is otherwise normal in her appearance.  Dentition is somewhat poor.  Oral mucosa is normal. NEUROLOGIC:  Pupils are equal, round, and reactive to light.  Full extraocular movements.  Full visual fields.  Hearing intact to finger rub bilaterally.  Uvula elevates in the midline.  Shoulder shrug is normal.  Tongue protrudes in the midline.  No drift on exam.  5/5 strength in the upper and lower extremities.  Normal muscle tone, bulk, and  coordination.  Reflexes 2 to 3+ at the knees, 2+ at the ankles, 2+ at biceps.  Proprioception intact in the upper and lower extremities. Light touch is intact. NECK:  She has no cervical masses or bruits. LUNGS:  Clear. HEART:  Regular rhythm and rate.  No murmurs or rubs are appreciated. Pulses good at the wrists bilaterally. ABDOMEN:  Soft and nontender.  Bowel sounds are present.  LABORATORY DATA:  CT is reviewed, shows no skull fractures, some swelling in the right forehead and the periorbital region.  There is no subarachnoid, subdural, or epidural blood.  Ventricles are not effaced. Basal cisterns are widely patent.  There is a large around lesion in the region and the territory of the basilar artery.  Subsequent CT angio shows very high riding above the clinoids, basilar tip aneurysm.  No other abnormalities noted.  Jill [name] has had an injury to her face and did have syncopal episode. She does not have a sentinel hemorrhage.  She again strongly denies having any type of headache prior to this.  She was with her daughter who is a Engineer, civil (consulting) and the daughter confirms that her mother never complained of headaches.  She had this previous episode of syncope which again was not associated with any type of headache and one does not have to sentinel hemorrhages.  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  I certainly offered her the opportunity to stay in the hospital and we could definitively treat the lesion now, but she is comfortable going home and would like to go home which I think is also very safe.  I quoted her risks of 1-2% chance of rupture, but again this is an unruptured aneurysm which I do not think has had a sentinel hemorrhage.  She will be seen by me in the office next week and we will proceed with a definitive treatment.  I did give her prescription for Xanax as she says she would like to use that for rest and I will see  her in the office next week.  I answered all questions of Jill [name] and her daughter.  i explained my rationale to both family and the ER physician.  She will be discharged into the emergency room.          ______________________________ Coletta Memos, M.D.     KC/MEDQ  D:  09/08/2011  T:  09/08/2011  Job:  [id number]  Electronically Signed by Coletta Memos M.D. on 09/11/2011 06:25:25 PM

## 2011-09-15 ENCOUNTER — Other Ambulatory Visit (HOSPITAL_COMMUNITY): Payer: Self-pay | Admitting: Neurosurgery

## 2011-09-15 DIAGNOSIS — I729 Aneurysm of unspecified site: Secondary | ICD-10-CM

## 2011-09-18 ENCOUNTER — Other Ambulatory Visit (HOSPITAL_COMMUNITY): Payer: Self-pay | Admitting: Interventional Radiology

## 2011-09-18 ENCOUNTER — Ambulatory Visit (HOSPITAL_COMMUNITY)
Admission: RE | Admit: 2011-09-18 | Discharge: 2011-09-18 | Disposition: A | Discharge status: Home / Self Care | Payer: Self-pay | Source: Ambulatory Visit | Attending: Neurosurgery | Admitting: Neurosurgery

## 2011-09-18 DIAGNOSIS — I725 Aneurysm of other precerebral arteries: Secondary | ICD-10-CM

## 2011-09-18 DIAGNOSIS — I729 Aneurysm of unspecified site: Secondary | ICD-10-CM

## 2011-09-26 ENCOUNTER — Other Ambulatory Visit: Payer: Self-pay | Admitting: Cardiovascular Disease

## 2011-09-26 NOTE — Telephone Encounter (Signed)
Fax Received. Refill Completed. Jill [name] (M.A)  

## 2011-10-03 ENCOUNTER — Other Ambulatory Visit: Payer: Self-pay | Admitting: Neurology

## 2011-10-03 ENCOUNTER — Encounter (HOSPITAL_COMMUNITY)
Admission: RE | Admit: 2011-10-03 | Discharge: 2011-10-03 | Disposition: A | Discharge status: Home / Self Care | Payer: Self-pay | Source: Ambulatory Visit | Attending: Interventional Radiology | Admitting: Interventional Radiology

## 2011-10-03 ENCOUNTER — Ambulatory Visit (HOSPITAL_COMMUNITY)
Admission: RE | Admit: 2011-10-03 | Discharge: 2011-10-03 | Disposition: A | Discharge status: Home / Self Care | Payer: Self-pay | Source: Ambulatory Visit | Attending: Anesthesiology | Admitting: Anesthesiology

## 2011-10-03 ENCOUNTER — Encounter (HOSPITAL_COMMUNITY): Payer: Self-pay

## 2011-10-03 DIAGNOSIS — I671 Cerebral aneurysm, nonruptured: Secondary | ICD-10-CM | POA: Insufficient documentation

## 2011-10-03 DIAGNOSIS — F17200 Nicotine dependence, unspecified, uncomplicated: Secondary | ICD-10-CM | POA: Insufficient documentation

## 2011-10-03 DIAGNOSIS — Z01818 Encounter for other preprocedural examination: Secondary | ICD-10-CM | POA: Insufficient documentation

## 2011-10-03 DIAGNOSIS — I10 Essential (primary) hypertension: Secondary | ICD-10-CM | POA: Insufficient documentation

## 2011-10-03 DIAGNOSIS — Z01812 Encounter for preprocedural laboratory examination: Secondary | ICD-10-CM | POA: Insufficient documentation

## 2011-10-03 HISTORY — DX: Encounter for other specified aftercare: Z51.89

## 2011-10-03 HISTORY — DX: Reserved for inherently not codable concepts without codable children: IMO0001

## 2011-10-03 HISTORY — DX: Headache: R51

## 2011-10-03 HISTORY — DX: Acute myocardial infarction, unspecified: I21.9

## 2011-10-03 HISTORY — DX: Gastro-esophageal reflux disease without esophagitis: K21.9

## 2011-10-03 LAB — COMPREHENSIVE METABOLIC PANEL
CO2: 29 mEq/L (ref 19–32)
Calcium: 10.1 mg/dL (ref 8.4–10.5)
Creatinine, Ser: 0.97 mg/dL (ref 0.50–1.10)
GFR calc Af Amer: 71 mL/min — ABNORMAL LOW (ref >90)
GFR calc non Af Amer: 61 mL/min — ABNORMAL LOW (ref >90)
Glucose, Bld: 90 mg/dL (ref 70–99)

## 2011-10-03 LAB — PROTIME-INR: Prothrombin Time: 12.9 seconds (ref 11.6–15.2)

## 2011-10-03 LAB — DIFFERENTIAL
Basophils Absolute: 0.1 10*3/uL (ref 0.0–0.1)
Lymphocytes Relative: 35 % (ref 12–46)
Neutro Abs: 6.2 10*3/uL (ref 1.7–7.7)
Neutrophils Relative %: 55 % (ref 43–77)

## 2011-10-03 LAB — CBC
Hemoglobin: 15.1 g/dL — ABNORMAL HIGH (ref 12.0–15.0)
MCH: 32.4 pg (ref 26.0–34.0)
MCV: 95.7 fL (ref 78.0–100.0)
Platelets: 259 10*3/uL (ref 150–400)
RBC: 4.66 MIL/uL (ref 3.87–5.11)

## 2011-10-03 MED ORDER — SODIUM CHLORIDE 0.9 % IV SOLN
INTRAVENOUS | Status: DC
  Filled 2011-10-03: qty 1000

## 2011-10-03 MED ORDER — VANCOMYCIN HCL 1000 MG IV SOLR: 1000.0000 mg | Freq: Once | INTRAVENOUS | Status: DC

## 2011-10-03 NOTE — Progress Notes (Signed)
ANESTHESIA PLEASE [facility] HX,STUDIES.

## 2011-10-03 NOTE — Pre-Procedure Instructions (Signed)
20 COLLINS DIMARIA  10/03/2011   Your procedure is scheduled on:NOV 29   Report to [facility] at 0600 AM.  Call this number if you have problems the morning of surgery: [phone]   Remember:   Do not eat food:After Midnight.  May have clear liquids: up to 4 Hours before arrival.  Clear liquids include soda, tea, black coffee, apple or grape juice, broth.  Take these medicines the morning of surgery with A SIP OF WATER: PAXIL LEVOTHYROXINE   Do not wear jewelry, make-up or nail polish.  Do not wear lotions, powders, or perfumes. You may wear deodorant.  Do not shave 48 hours prior to surgery.  Do not bring valuables to the hospital.  Contacts, dentures or bridgework may not be worn into surgery.  Leave suitcase in the car. After surgery it may be brought to your room.  For patients admitted to the hospital, checkout time is 11:00 AM the day of discharge.   Patients discharged the day of surgery will not be allowed to drive home.  Name and phone number of your driver:FAMILY Special Instructions: CHG Shower Use Special Wash: 1/2 bottle night before surgery and 1/2 bottle morning of surgery.   Please read over the following fact sheets that you were given: Pain Booklet, Coughing and Deep Breathing and Surgical Site Infection Prevention

## 2011-10-04 NOTE — Consult Note (Signed)
Anesthesia:  Patient is a 60 year old female for endovascular stenting or coiling for an unruptured basilar artery cerebral aneursym.   Her past medical hx is significant for CAD, MI s/p RCA stent '07, CABG '08, RCA stent, left BBB, hyperlipidemia, HTN, hypothyroidism, smoking, GERD, retroperitoneal bleed following a cardiac cath, anxiety.  Her Cardiologist is Dr. Elease Hashimoto.    Her last stress test done on 05/14/11 showed relatively fixed perfusion defects involving the septum, inferoseptal wall and inferior wall. No convincing inducible ischemia with Lexiscan administration. Normal ventricular function with evidence of septal hypokinesis and calculated ejection fraction of 70%.  Her EKG then showed a left BBB.  I could not locate her last cath and echo(TEE) in Epic, but these were both done in 2008 and are in E-chart.  Her cath was done on 01/02/07 and prompted CT surgery referral for CABG.  Her TEE on 02/06/07 was intraoperative during her CABG and did not show any significant valvular disease.  EF was only 40% at that time.   She was last seen at [facility] by NP Norma Fredrickson on 05/30/11 with instructions to f/u as needed.  Her EKG done earlier this month showed NSR, nonspecific intra-ventricular conduction block, not felt significantly changed from her previous EKG.  Her labs and CXR were noted and are acceptable from an anesthesia standpoint.  Plan to proceed.

## 2011-10-05 ENCOUNTER — Inpatient Hospital Stay (HOSPITAL_COMMUNITY)
Admission: RE | Admit: 2011-10-05 | Discharge: 2011-10-22 | DRG: 025 | Disposition: A | Discharge status: Home / Self Care | Payer: Medicaid Other | Source: Ambulatory Visit | Attending: Interventional Radiology | Admitting: Interventional Radiology

## 2011-10-05 ENCOUNTER — Encounter (HOSPITAL_COMMUNITY): Payer: Self-pay | Admitting: Vascular Surgery

## 2011-10-05 ENCOUNTER — Encounter (HOSPITAL_COMMUNITY): Payer: Self-pay

## 2011-10-05 ENCOUNTER — Inpatient Hospital Stay (HOSPITAL_COMMUNITY): Payer: Medicaid Other

## 2011-10-05 VITALS — BP 123/66 | HR 67 | Temp 97.8°F | Resp 20 | Ht 67.0 in | Wt 143.3 lb

## 2011-10-05 DIAGNOSIS — I10 Essential (primary) hypertension: Secondary | ICD-10-CM | POA: Diagnosis not present

## 2011-10-05 DIAGNOSIS — E039 Hypothyroidism, unspecified: Secondary | ICD-10-CM | POA: Insufficient documentation

## 2011-10-05 DIAGNOSIS — IMO0002 Reserved for concepts with insufficient information to code with codable children: Secondary | ICD-10-CM | POA: Diagnosis not present

## 2011-10-05 DIAGNOSIS — F17200 Nicotine dependence, unspecified, uncomplicated: Secondary | ICD-10-CM | POA: Diagnosis present

## 2011-10-05 DIAGNOSIS — R29810 Facial weakness: Secondary | ICD-10-CM | POA: Diagnosis not present

## 2011-10-05 DIAGNOSIS — I6529 Occlusion and stenosis of unspecified carotid artery: Secondary | ICD-10-CM | POA: Diagnosis present

## 2011-10-05 DIAGNOSIS — K5900 Constipation, unspecified: Secondary | ICD-10-CM | POA: Diagnosis not present

## 2011-10-05 DIAGNOSIS — Z7902 Long term (current) use of antithrombotics/antiplatelets: Secondary | ICD-10-CM

## 2011-10-05 DIAGNOSIS — I725 Aneurysm of other precerebral arteries: Secondary | ICD-10-CM

## 2011-10-05 DIAGNOSIS — I619 Nontraumatic intracerebral hemorrhage, unspecified: Secondary | ICD-10-CM | POA: Diagnosis not present

## 2011-10-05 DIAGNOSIS — Z7982 Long term (current) use of aspirin: Secondary | ICD-10-CM

## 2011-10-05 DIAGNOSIS — K219 Gastro-esophageal reflux disease without esophagitis: Secondary | ICD-10-CM | POA: Diagnosis present

## 2011-10-05 DIAGNOSIS — I671 Cerebral aneurysm, nonruptured: Principal | ICD-10-CM | POA: Diagnosis present

## 2011-10-05 DIAGNOSIS — I252 Old myocardial infarction: Secondary | ICD-10-CM

## 2011-10-05 DIAGNOSIS — G911 Obstructive hydrocephalus: Secondary | ICD-10-CM | POA: Diagnosis not present

## 2011-10-05 DIAGNOSIS — I729 Aneurysm of unspecified site: Secondary | ICD-10-CM

## 2011-10-05 DIAGNOSIS — E785 Hyperlipidemia, unspecified: Secondary | ICD-10-CM | POA: Insufficient documentation

## 2011-10-05 DIAGNOSIS — F411 Generalized anxiety disorder: Secondary | ICD-10-CM | POA: Diagnosis present

## 2011-10-05 DIAGNOSIS — R739 Hyperglycemia, unspecified: Secondary | ICD-10-CM | POA: Diagnosis not present

## 2011-10-05 DIAGNOSIS — I2510 Atherosclerotic heart disease of native coronary artery without angina pectoris: Secondary | ICD-10-CM | POA: Insufficient documentation

## 2011-10-05 DIAGNOSIS — Z79899 Other long term (current) drug therapy: Secondary | ICD-10-CM

## 2011-10-05 DIAGNOSIS — Y831 Surgical operation with implant of artificial internal device as the cause of abnormal reaction of the patient, or of later complication, without mention of misadventure at the time of the procedure: Secondary | ICD-10-CM | POA: Diagnosis not present

## 2011-10-05 DIAGNOSIS — Z951 Presence of aortocoronary bypass graft: Secondary | ICD-10-CM

## 2011-10-05 LAB — DIFFERENTIAL
Basophils Relative: 0 % (ref 0–1)
Eosinophils Absolute: 0 10*3/uL (ref 0.0–0.7)
Eosinophils Relative: 0 % (ref 0–5)
Lymphs Abs: 1.2 10*3/uL (ref 0.7–4.0)
Monocytes Relative: 5 % (ref 3–12)

## 2011-10-05 LAB — CBC
Hemoglobin: 12.5 g/dL (ref 12.0–15.0)
MCH: 32.4 pg (ref 26.0–34.0)
MCHC: 34 g/dL (ref 30.0–36.0)
MCV: 95.3 fL (ref 78.0–100.0)
RBC: 3.86 MIL/uL — ABNORMAL LOW (ref 3.87–5.11)

## 2011-10-05 LAB — POCT ACTIVATED CLOTTING TIME
Activated Clotting Time: 276 seconds
Activated Clotting Time: 287 seconds

## 2011-10-05 LAB — PLATELET INHIBITION P2Y12: Platelet Function  P2Y12: 130 [PRU] — ABNORMAL LOW (ref 194–418)

## 2011-10-05 MED ORDER — ACETAMINOPHEN 500 MG PO TABS
1000.0000 mg | ORAL_TABLET | Freq: Four times a day (QID) | ORAL | Status: DC | PRN
  Administered 2011-10-05 – 2011-10-17 (×35): 1000 mg via ORAL
  Filled 2011-10-05 (×4): qty 2
  Filled 2011-10-05: qty 1
  Filled 2011-10-05: qty 2
  Filled 2011-10-05: qty 1
  Filled 2011-10-05 (×15): qty 2
  Filled 2011-10-05: qty 1
  Filled 2011-10-05 (×14): qty 2

## 2011-10-05 MED ORDER — CLOPIDOGREL BISULFATE 75 MG PO TABS
75.0000 mg | ORAL_TABLET | Freq: Every day | ORAL | Status: DC
  Administered 2011-10-06: 75 mg via ORAL
  Filled 2011-10-05 (×2): qty 1

## 2011-10-05 MED ORDER — ONDANSETRON HCL 4 MG/2ML IJ SOLN
4.0000 mg | Freq: Four times a day (QID) | INTRAMUSCULAR | Status: DC | PRN
  Administered 2011-10-05 – 2011-10-13 (×4): 4 mg via INTRAVENOUS
  Filled 2011-10-05 (×4): qty 2

## 2011-10-05 MED ORDER — ONDANSETRON HCL 4 MG/2ML IJ SOLN
INTRAMUSCULAR | Status: DC | PRN
  Administered 2011-10-05: 4 mg via INTRAVENOUS

## 2011-10-05 MED ORDER — EPHEDRINE SULFATE 50 MG/ML IJ SOLN
INTRAMUSCULAR | Status: DC | PRN
  Administered 2011-10-05 (×2): 5 mg via INTRAVENOUS

## 2011-10-05 MED ORDER — NIMODIPINE 30 MG PO CAPS: 60.0000 mg | ORAL_CAPSULE | ORAL | Status: DC

## 2011-10-05 MED ORDER — SODIUM CHLORIDE 0.9 % IJ SOLN
1.5000 mg | INTRAVENOUS | Status: DC
  Filled 2011-10-05: qty 0.3

## 2011-10-05 MED ORDER — PROPOFOL 10 MG/ML IV EMUL
INTRAVENOUS | Status: DC | PRN
  Administered 2011-10-05: 200 mg via INTRAVENOUS

## 2011-10-05 MED ORDER — KETOROLAC TROMETHAMINE 30 MG/ML IJ SOLN
30.0000 mg | Freq: Once | INTRAMUSCULAR | Status: AC
  Administered 2011-10-06: 30 mg via INTRAVENOUS
  Filled 2011-10-05 (×2): qty 1

## 2011-10-05 MED ORDER — HEPARIN SODIUM (PORCINE) 1000 UNIT/ML IJ SOLN
INTRAMUSCULAR | Status: DC | PRN
  Administered 2011-10-05 (×2): 500 [IU] via INTRAVENOUS
  Administered 2011-10-05: 3000 [IU] via INTRAVENOUS
  Administered 2011-10-05 (×3): 500 [IU] via INTRAVENOUS

## 2011-10-05 MED ORDER — ASPIRIN EC 325 MG PO TBEC: 325.0000 mg | DELAYED_RELEASE_TABLET | ORAL | Status: DC

## 2011-10-05 MED ORDER — VANCOMYCIN HCL IN DEXTROSE 1-5 GM/200ML-% IV SOLN
1000.0000 mg | Freq: Once | INTRAVENOUS | Status: AC
  Administered 2011-10-05: 1000 mg via INTRAVENOUS
  Filled 2011-10-05: qty 200

## 2011-10-05 MED ORDER — VECURONIUM BROMIDE 10 MG IV SOLR
INTRAVENOUS | Status: DC | PRN
  Administered 2011-10-05 (×2): 1 mg via INTRAVENOUS
  Administered 2011-10-05 (×2): 2 mg via INTRAVENOUS

## 2011-10-05 MED ORDER — LACTATED RINGERS IV SOLN
INTRAVENOUS | Status: DC | PRN
  Administered 2011-10-05: 09:00:00 via INTRAVENOUS

## 2011-10-05 MED ORDER — ESMOLOL HCL 10 MG/ML IV SOLN
INTRAVENOUS | Status: DC | PRN
  Administered 2011-10-05: 10 mg via INTRAVENOUS
  Administered 2011-10-05: 60 mg via INTRAVENOUS
  Administered 2011-10-05 (×2): 20 mg via INTRAVENOUS
  Administered 2011-10-05 (×3): 10 mg via INTRAVENOUS

## 2011-10-05 MED ORDER — CLOPIDOGREL BISULFATE 75 MG PO TABS: 75.0000 mg | ORAL_TABLET | ORAL | Status: DC

## 2011-10-05 MED ORDER — HEPARIN SOD (PORCINE) IN D5W 100 UNIT/ML IV SOLN
550.0000 [IU]/h | INTRAVENOUS | Status: DC
  Administered 2011-10-05: 650 [IU]/h via INTRAVENOUS
  Filled 2011-10-05: qty 250

## 2011-10-05 MED ORDER — IOHEXOL 300 MG/ML  SOLN
500.0000 mL | Freq: Once | INTRAMUSCULAR | Status: AC | PRN
  Administered 2011-10-05: 240 mL via INTRAVENOUS

## 2011-10-05 MED ORDER — NICARDIPINE HCL IN NACL 20-0.86 MG/200ML-% IV SOLN
5.0000 mg/h | INTRAVENOUS | Status: DC
  Administered 2011-10-05: 5 mg/h via INTRAVENOUS
  Filled 2011-10-05 (×3): qty 200

## 2011-10-05 MED ORDER — ROCURONIUM BROMIDE 100 MG/10ML IV SOLN
INTRAVENOUS | Status: DC | PRN
  Administered 2011-10-05: 50 mg via INTRAVENOUS

## 2011-10-05 MED ORDER — FENTANYL CITRATE 0.05 MG/ML IJ SOLN
INTRAMUSCULAR | Status: DC | PRN
  Administered 2011-10-05 (×3): 100 ug via INTRAVENOUS
  Administered 2011-10-05: 50 ug via INTRAVENOUS

## 2011-10-05 MED ORDER — NEOSTIGMINE METHYLSULFATE 1 MG/ML IJ SOLN
INTRAMUSCULAR | Status: DC | PRN
  Administered 2011-10-05: 4 mg via INTRAVENOUS

## 2011-10-05 MED ORDER — ASPIRIN 325 MG PO TABS
325.0000 mg | ORAL_TABLET | Freq: Every day | ORAL | Status: DC
  Filled 2011-10-05 (×2): qty 1

## 2011-10-05 MED ORDER — HEPARIN SOD (PORCINE) IN D5W 100 UNIT/ML IV SOLN
500.0000 [IU]/h | INTRAVENOUS | Status: DC
  Filled 2011-10-05 (×2): qty 250

## 2011-10-05 MED ORDER — ACETAMINOPHEN 650 MG RE SUPP: 650.0000 mg | Freq: Four times a day (QID) | RECTAL | Status: DC | PRN

## 2011-10-05 MED ORDER — GLYCOPYRROLATE 0.2 MG/ML IJ SOLN
INTRAMUSCULAR | Status: DC | PRN
  Administered 2011-10-05: .4 mg via INTRAVENOUS
  Administered 2011-10-05: 0.1 mg via INTRAVENOUS

## 2011-10-05 MED ORDER — MIDAZOLAM HCL 5 MG/5ML IJ SOLN
INTRAMUSCULAR | Status: DC | PRN
  Administered 2011-10-05 (×2): 1 mg via INTRAVENOUS

## 2011-10-05 MED ORDER — SODIUM CHLORIDE 0.9 % IV SOLN
INTRAVENOUS | Status: DC | PRN
  Administered 2011-10-05 (×2): via INTRAVENOUS

## 2011-10-05 NOTE — Progress Notes (Signed)
Temp 97.8 Pulse 56 B/p 155/90 sats 95%  resp 18

## 2011-10-05 NOTE — Progress Notes (Addendum)
Subjective: S/p stent assisted coiling of basilar tip artery aneurysm. Pt. Slow to wake up after extubation. Objective: Vital signs in last 24 hours: Temp:  [97.8 F (36.6 C)] 97.8 F (36.6 C) (11/29 0648) Pulse Rate:  [55-69] 64  (11/29 1600) Resp:  [20] 20  (11/29 1600) BP: (155)/(90) 155/90 mmHg (11/29 0648) SpO2:  [95 %-100 %] 96 % (11/29 1600) Arterial Line BP: (153-162)/(64-70) 153/64 mmHg (11/29 1600) Weight:  [148 lb 5.9 oz (67.3 kg)] 148 lb 5.9 oz (67.3 kg) (11/29 1300)    Intake/Output from previous day:   Intake/Output this shift: Total I/O In: 2000 [I.V.:2000] Out: 600 [Urine:600]  PE: BP 115/51 P 56 R 17 Sat 92% on 3 L/M [place] O2 Nurses unable to get a temp. Pt lethargic but will respond to voice. Heent - No assymetry [place] Heart - RRR distant Lungs - diffuse wheezing R Groin - sheath intact - no hematoma. Distal pulses - weak but intact. Neuro - oriented to person and place with prompting Moves all extremities on command 3-4/5  Lab Results:   [facility] 10/03/11 1404  WBC 11.2*  HGB 15.1*  HCT 44.6  PLT 259   BMET  Basename 10/03/11 1404  NA 142  K 4.0  CL 103  CO2 29  GLUCOSE 90  BUN 10  CREATININE 0.97  CALCIUM 10.1   PT/INR  Basename 10/03/11 1404  LABPROT 12.9  INR 0.95   ABG No results found for this basename: PHART:2,PCO2:2,PO2:2,HCO3:2 in the last 72 hours  Studies/Results: Mr Angiogram Head Wo Contrast  10/05/2011  *RADIOLOGY REPORT*  Clinical Data:  Status post coiling of basilar tip aneurysm.  The patient is slow to wake from anesthesia.  MRI HEAD WITHOUT CONTRAST MRA HEAD WITHOUT CONTRAST  Technique:  Multiplanar, multiecho pulse sequences of the brain and surrounding structures were obtained without intravenous contrast. Angiographic images of the head were obtained using MRA technique without contrast.  Comparison:  CT angiogram of the head 09/08/2011 and cerebral arteriogram 10/05/2011.  Both studies are performed at [facility].  MRI HEAD  Findings:  The diffusion weighted images demonstrate no evidence for acute or subacute infarction.  An axial T2 sequence was performed.  It is signify degraded by patient motion.  The there does appear to be flow within the major intracranial arteries. Right sphenoid sinus disease is noted.  Minimal mucosal thickening is present in the maxillary sinuses bilaterally.  IMPRESSION:  1.  No evidence for acute or subacute infarction. 2.  Sphenoid sinus disease.  This may be secondary to the patient's intubated status.  MRA HEAD  Findings: Atherosclerotic irregularity is present through the cavernous carotid arteries bilaterally.  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  The A1 and M1 segments are normal.  The left A1 and M1 segments are normal. There is early bifurcation of the right MCA, a normal variant.  ACA and MCA branch vessels are normal.  The vertebral arteries are codominant.  The PICA origins are visualized and normal bilaterally.  There is signal loss in the distal basilar artery and proximal left posterior cerebral artery secondary to the stent placement.  The more distal posterior cerebral arteries fill normally.  Two left and one right superior cerebellar arteries are visualized.  No residual aneurysm is evident.  IMPRESSION:  1.  Signal loss in the distal basilar and proximal left posterior cerebral artery is artifactual secondary to stent placement. 2.  No definite residual aneurysm. 3.  More  normal signal in the distal posterior cerebral arteries suggests flow through those areas. 4.  Normal appearance of the superior cerebellar arteries bilaterally. 5.  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  Original Report Authenticated By: Jamesetta Orleans. MATTERN, M.D.   Mr Brain Ltd W/o Cm  10/05/2011  *RADIOLOGY REPORT*  Clinical Data:  Status post coiling of basilar tip aneurysm.  The patient is slow to wake from anesthesia.  MRI HEAD  WITHOUT CONTRAST MRA HEAD WITHOUT CONTRAST  Technique:  Multiplanar, multiecho pulse sequences of the brain and surrounding structures were obtained without intravenous contrast. Angiographic images of the head were obtained using MRA technique without contrast.  Comparison:  CT angiogram of the head 09/08/2011 and cerebral arteriogram 10/05/2011.  Both studies are performed at [facility].  MRI HEAD  Findings:  The diffusion weighted images demonstrate no evidence for acute or subacute infarction.  An axial T2 sequence was performed.  It is signify degraded by patient motion.  The there does appear to be flow within the major intracranial arteries. Right sphenoid sinus disease is noted.  Minimal mucosal thickening is present in the maxillary sinuses bilaterally.  IMPRESSION:  1.  No evidence for acute or subacute infarction. 2.  Sphenoid sinus disease.  This may be secondary to the patient's intubated status.  MRA HEAD  Findings: Atherosclerotic irregularity is present through the cavernous carotid arteries bilaterally.  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  The A1 and M1 segments are normal.  The left A1 and M1 segments are normal. There is early bifurcation of the right MCA, a normal variant.  ACA and MCA branch vessels are normal.  The vertebral arteries are codominant.  The PICA origins are visualized and normal bilaterally.  There is signal loss in the distal basilar artery and proximal left posterior cerebral artery secondary to the stent placement.  The more distal posterior cerebral arteries fill normally.  Two left and one right superior cerebellar arteries are visualized.  No residual aneurysm is evident.  IMPRESSION:  1.  Signal loss in the distal basilar and proximal left posterior cerebral artery is artifactual secondary to stent placement. 2.  No definite residual aneurysm. 3.  More normal signal in the distal posterior cerebral arteries suggests flow through  those areas. 4.  Normal appearance of the superior cerebellar arteries bilaterally. 5.  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  Original Report Authenticated By: Jamesetta Orleans. MATTERN, M.D.    Anti-infectives: Anti-infectives     Start     Dose/Rate Route Frequency Ordered Stop   10/05/11 1600   vancomycin (VANCOCIN) IVPB 1000 mg/200 mL premix        1,000 mg 200 mL/hr over 60 Minutes Intravenous  Once 10/05/11 1414     10/05/11 0945   vancomycin (VANCOCIN) IVPB 1000 mg/200 mL premix        1,000 mg 200 mL/hr over 60 Minutes Intravenous  Once 10/05/11 0930 10/05/11 1017          Assessment/Plan: s/p stent assisted coiling of basilar tip artery aneurysm. Slow to wake. No focal neuro deficits noted on exam. MRI post procedure as above. No acute abnormality. Patient currently on IV Heparin and Cardene. Dr. Corliss Skains to see. Radial art line not felt to be accurate. Using right femoral art line to monitor BP.   LOS: 0 days    Jill [name] 10/05/2011

## 2011-10-05 NOTE — Consult Note (Signed)
Referring Physician: Corliss Skains    Chief Complaint: Altered mental status  HPI: Jill [name] is an 60 y.o. female who had a stent placed today for vertebrobasilar disease. Procedure was uneventful but when patient was extubated she did not regain consciousness.  Code stroke was called and patient was taken to the MR suite and imaging was performed.  No evidence of acute stroke was seen.  Patient was the admitted to the NICU for further care and management.   LSN: [id number] tPA Given: No: No stroke seen on MR imaging  Past Medical History  Diagnosis Date  . Coronary artery disease     Prior inferior MI with stent to RCA, s/p CABG in 2008  . Hyperlipidemia   . Hypertension   . Fatigue   . Dizziness   . Hypothyroidism   . Normal nuclear stress test Ju;y 2012    No ischemia. EF 70%; fixed defect involving septum, inferoseptal and inferior wall  . Tobacco abuse   . Retroperitoneal bleeding     Following cardiac cath  . GERD (gastroesophageal reflux disease)   . Headache     UNRUPTURED CEREBRAL ANEURYSM  . Myocardial infarction   . Blood transfusion   . Anxiety     ON Reino Kent    Past Surgical History  Procedure Date  . Cardiac catheterization 01/02/2007    IT REVEALS MILD INFERIOR WALL HYPOKINESIS. THE EJECTION FRACTION IS AROUND 50%  . Coronary artery bypass graft 2008    LIMA to LAD, SVG to DX, SVG to LCX & SVG to OM 1 & 2, and SVG to PD  . Coronary stent placement     Remote past stent to RCA  . Hemorrhoid surgery 1989    Family History  Problem Relation Age of Onset  . Heart disease Neg Hx   . Cancer Mother 63  . Diabetes Mother   . Alcohol abuse Father   . Asthma Father   . Diabetes Brother   . Cancer Brother     lung cancer  . Cancer Maternal Aunt     ovarian ca  . Cancer Paternal Aunt     liver cancer   Social History:  reports that she has been smoking Cigarettes.  She has a 13.25 pack-year smoking history. She does not have any smokeless tobacco history  on file. She reports that she does not drink alcohol or use illicit drugs.  Allergies:  Allergies  Allergen Reactions  . Ace Inhibitors Cough  . Penicillins   . Sulfa Drugs Cross Reactors Rash    Medications:  I have reviewed the patient's current medications. Prior to Admission:  Prescriptions prior to admission  Medication Sig Dispense Refill  . ALPRAZolam (XANAX) 0.5 MG tablet Take 0.5 mg by mouth at bedtime as needed. For anxiety       . aspirin 81 MG tablet Take 81 mg by mouth daily.        . clopidogrel (PLAVIX) 75 MG tablet Take 75 mg by mouth daily.       . hydrochlorothiazide 25 MG tablet Take 1 tablet (25 mg total) by mouth daily.  90 tablet  3  . levothyroxine (SYNTHROID, LEVOTHROID) 100 MCG tablet Take 1 tablet (100 mcg total) by mouth daily.  30 tablet  3  . losartan (COZAAR) 100 MG tablet Take 1 tablet (100 mg total) by mouth daily.  90 tablet  3  . metoprolol (LOPRESSOR) 50 MG tablet Take 50 mg by mouth daily.        Marland Kitchen  nitroGLYCERIN (NITROSTAT) 0.4 MG SL tablet Place 1 tablet (0.4 mg total) under the tongue every 5 (five) minutes as needed for chest pain.  25 tablet  12  . PARoxetine (PAXIL) 40 MG tablet Take 1 tablet (40 mg total) by mouth every morning.  30 tablet  3  . simvastatin (ZOCOR) 80 MG tablet Take 0.5 tablets (40 mg total) by mouth at bedtime.  30 tablet  12  . DISCONTD: ALPRAZolam (XANAX) 0.5 MG tablet Take 1 tablet (0.5 mg total) by mouth at bedtime as needed.  30 tablet  0  . omeprazole (PRILOSEC) 40 MG capsule Take 40 mg by mouth daily.         Scheduled:   . aspirin  325 mg Oral Q breakfast  . clopidogrel  75 mg Oral Q breakfast  . vancomycin  1,000 mg Intravenous Once  . vancomycin  1,000 mg Intravenous Once  . DISCONTD: nitroGLYCERIN 1.5mg /16ml(25 mcg/ml)-FOR IR  1.5 mg Intra-arterial to XRAY    ROS: Unable to obtain  Physical Examination: Blood pressure 115/51, pulse 57, temperature 97.8 F (36.6 C), temperature source Oral, resp. rate 19,  height 5\' 7"  (1.702 m), weight 73.3 kg (161 lb 9.6 oz), SpO2 91.00%.  Neurologic Examination: Mental Status: Patient responds only to extensive verbal and tactile stimuli.  Response at that time is slurred and consists of 1-2 words.  Follows only simple commands.  Cranial Nerves: II: patient does not respond confrontation bilaterally, pupils right 3 mm, left 3 mm,and reactive bilaterally III,IV,VI: doll's response present bilaterally. V,VII: corneal reflex present bilaterally  VIII: patient does respond to verbal stimuli but must be loud and repetitive IX,X: gag reflex present, XI: trapezius strength unable to test bilaterally XII: tongue midline Motor: Patient able to lift all extremities off of the bed and maintain but they then slowly drift downward to the bed.  No focality seen. Sensory: Responds to noxious stimuli in all extremities. Deep Tendon Reflexes:  2+ throughout. Plantars: downgoing bilaterally Cerebellar: Unable to perform    Results for orders placed during the hospital encounter of 10/05/11 (from the past 48 hour(s))  POCT ACTIVATED CLOTTING TIME     Status: Normal   Collection Time   10/05/11 11:04 AM      Component Value Range Comment   Activated Clotting Time 221     POCT ACTIVATED CLOTTING TIME     Status: Normal   Collection Time   10/05/11 12:20 PM      Component Value Range Comment   Activated Clotting Time 276     POCT ACTIVATED CLOTTING TIME     Status: Normal   Collection Time   10/05/11  1:04 PM      Component Value Range Comment   Activated Clotting Time 287     POCT ACTIVATED CLOTTING TIME     Status: Normal   Collection Time   10/05/11  1:25 PM      Component Value Range Comment   Activated Clotting Time 232      Mr Angiogram Head Wo Contrast  10/05/2011  *RADIOLOGY REPORT*  Clinical Data:  Status post coiling of basilar tip aneurysm.  The patient is slow to wake from anesthesia.  MRI HEAD WITHOUT CONTRAST MRA HEAD WITHOUT CONTRAST   Technique:  Multiplanar, multiecho pulse sequences of the brain and surrounding structures were obtained without intravenous contrast. Angiographic images of the head were obtained using MRA technique without contrast.  Comparison:  CT angiogram of the head 09/08/2011 and cerebral arteriogram  10/05/2011.  Both studies are performed at [facility].  MRI HEAD  Findings:  The diffusion weighted images demonstrate no evidence for acute or subacute infarction.  An axial T2 sequence was performed.  It is signify degraded by patient motion.  The there does appear to be flow within the major intracranial arteries. Right sphenoid sinus disease is noted.  Minimal mucosal thickening is present in the maxillary sinuses bilaterally.  IMPRESSION:  1.  No evidence for acute or subacute infarction. 2.  Sphenoid sinus disease.  This may be secondary to the patient's intubated status.  MRA HEAD  Findings: Atherosclerotic irregularity is present through the cavernous carotid arteries bilaterally.  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  The A1 and M1 segments are normal.  The left A1 and M1 segments are normal. There is early bifurcation of the right MCA, a normal variant.  ACA and MCA branch vessels are normal.  The vertebral arteries are codominant.  The PICA origins are visualized and normal bilaterally.  There is signal loss in the distal basilar artery and proximal left posterior cerebral artery secondary to the stent placement.  The more distal posterior cerebral arteries fill normally.  Two left and one right superior cerebellar arteries are visualized.  No residual aneurysm is evident.  IMPRESSION:  1.  Signal loss in the distal basilar and proximal left posterior cerebral artery is artifactual secondary to stent placement. 2.  No definite residual aneurysm. 3.  More normal signal in the distal posterior cerebral arteries suggests flow through those areas. 4.  Normal appearance of the  superior cerebellar arteries bilaterally. 5.  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  Original Report Authenticated By: Jamesetta Orleans. MATTERN, M.D.   Mr Brain Ltd W/o Cm  10/05/2011  *RADIOLOGY REPORT*  Clinical Data:  Status post coiling of basilar tip aneurysm.  The patient is slow to wake from anesthesia.  MRI HEAD WITHOUT CONTRAST MRA HEAD WITHOUT CONTRAST  Technique:  Multiplanar, multiecho pulse sequences of the brain and surrounding structures were obtained without intravenous contrast. Angiographic images of the head were obtained using MRA technique without contrast.  Comparison:  CT angiogram of the head 09/08/2011 and cerebral arteriogram 10/05/2011.  Both studies are performed at [facility].  MRI HEAD  Findings:  The diffusion weighted images demonstrate no evidence for acute or subacute infarction.  An axial T2 sequence was performed.  It is signify degraded by patient motion.  The there does appear to be flow within the major intracranial arteries. Right sphenoid sinus disease is noted.  Minimal mucosal thickening is present in the maxillary sinuses bilaterally.  IMPRESSION:  1.  No evidence for acute or subacute infarction. 2.  Sphenoid sinus disease.  This may be secondary to the patient's intubated status.  MRA HEAD  Findings: Atherosclerotic irregularity is present through the cavernous carotid arteries bilaterally.  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  The A1 and M1 segments are normal.  The left A1 and M1 segments are normal. There is early bifurcation of the right MCA, a normal variant.  ACA and MCA branch vessels are normal.  The vertebral arteries are codominant.  The PICA origins are visualized and normal bilaterally.  There is signal loss in the distal basilar artery and proximal left posterior cerebral artery secondary to the stent placement.  The more distal posterior cerebral arteries fill normally.  Two left and one  right superior  cerebellar arteries are visualized.  No residual aneurysm is evident.  IMPRESSION:  1.  Signal loss in the distal basilar and proximal left posterior cerebral artery is artifactual secondary to stent placement. 2.  No definite residual aneurysm. 3.  More normal signal in the distal posterior cerebral arteries suggests flow through those areas. 4.  Normal appearance of the superior cerebellar arteries bilaterally. 5.  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  Original Report Authenticated By: Jamesetta Orleans. MATTERN, M.D.    Assessment: 60 y.o. female s/p stent placement.  After speaking with family they report that most of the family has difficulty awakening from anesthesia.  Patient has no focality on exam and has no evidence of acute stroke on MRI.  No intervention recommended at this time other than to follow.  Stroke Risk Factors - hyperlipidemia, hypertension, smoking and heart disease  Plan: 1. Frequent neuro checks 2. Will continue to follow with you.   Thana Farr, MD [facility] [phone] 10/05/2011, 6:15 PM

## 2011-10-05 NOTE — Anesthesia Procedure Notes (Signed)
Procedure Name: Intubation Date/Time: 10/05/2011 10:20 AM Performed by: Ellin Goodie Pre-anesthesia Checklist: Patient identified Patient Re-evaluated:Patient Re-evaluated prior to inductionOxygen Delivery Method: Circle System Utilized Preoxygenation: Pre-oxygenation with 100% oxygen Intubation Type: IV induction Ventilation: Mask ventilation without difficulty Laryngoscope Size: Mac and 3 Grade View: Grade I Tube type: Oral Tube size: 7.5 mm Number of attempts: 1 Airway Equipment and Method: stylet Placement Confirmation: ETT inserted through vocal cords under direct vision,  positive ETCO2 and breath sounds checked- equal and bilateral Secured at: 23 cm Tube secured with: Tape Dental Injury: Teeth and Oropharynx as per pre-operative assessment

## 2011-10-05 NOTE — Progress Notes (Signed)
Pt complaining of severe headache 7/10.  MD notified and ordered Toradol 30 mg IV one time.  Pt also vomited despite receiving Zofran.

## 2011-10-05 NOTE — Anesthesia Postprocedure Evaluation (Signed)
  Anesthesia Post-op Note  Patient: Jill [name]  Procedure(s) Performed: * No procedures listed *  Patient Location: PACU and ICU  Anesthesia Type: General  Level of Consciousness: awake  Airway and Oxygen Therapy: Patient Spontanous Breathing  Post-op Pain: none  Post-op Assessment: Post-op Vital signs reviewed  Post-op Vital Signs: stable  Complications: No apparent anesthesia complications

## 2011-10-05 NOTE — Anesthesia Preprocedure Evaluation (Addendum)
Anesthesia Evaluation  Patient identified by MRN, date of birth, ID band Patient awake    Airway Mallampati: II TM Distance: >3 FB     Dental  (+) Poor Dentition and Dental Advisory Given   Pulmonary shortness of breath, Current Smoker,  clear to auscultation        Cardiovascular hypertension, Pt. on home beta blockers + CAD and + Past MI Regular Normal+ Systolic murmurs    Neuro/Psych  Headaches,    GI/Hepatic Neg liver ROS, GERD-  Controlled,  Endo/Other  Hypothyroidism   Renal/GU negative Renal ROS     Musculoskeletal   Abdominal   Peds  Hematology   Anesthesia Other Findings   Reproductive/Obstetrics                           Anesthesia Physical Anesthesia Plan  ASA: III  Anesthesia Plan: MAC and General   Post-op Pain Management:    Induction: Intravenous  Airway Management Planned: Oral ETT  Additional Equipment: Arterial line  Intra-op Plan:   Post-operative Plan: Extubation in OR  Informed Consent: I have reviewed the patients History and Physical, chart, labs and discussed the procedure including the risks, benefits and alternatives for the proposed anesthesia with the patient or authorized representative who has indicated his/her understanding and acceptance.   Dental advisory given  Plan Discussed with: CRNA and Surgeon  Anesthesia Plan Comments:         Anesthesia Quick Evaluation

## 2011-10-05 NOTE — Progress Notes (Signed)
Plavix 75mg  given PO Nimodipine 60mg  po Aspirin 325mg  po

## 2011-10-05 NOTE — Progress Notes (Signed)
Patients GA was reversed,and extubated. However,unresponsive for a long time .  MRI MRA of brain done showed no new ischemic infarct. Circle of willis open . Patient beginning to awaken and moving all 4s. Obeying commands appropriately. Denies any HAs.  Code stroke was ordered. Neurology to see patient. Discussed with DR Thad Ranger Neurology. Family  kept fully appraised.Marland Kitchen

## 2011-10-05 NOTE — H&P (Signed)
Jill [name] is an 60 y.o. female seen in consultation on 09/18/11 for discussion on findings from CT scan performed due to presyncopal event. Chief Complaint: basilar artery aneurysm; single presyncopal event. HPI: patient with single event of presyncope presented to ED and found to have aneurysm involving the basilar artery. She has had intermittent frontal headaches with feelings of fatigue for quite some time. Risks for the patient in regards to rupture are that of hypertension, female gender, positive family history and long smoking history.  Patient presents today for formal angiogram with interventional treatment to include coiling and possible stent placement under general anesthesia. She denies any further presyncopal episodes since our consultation.   Past Medical History  Diagnosis Date  . Coronary artery disease     Prior inferior MI with stent to RCA, s/p CABG in 2008  . Hyperlipidemia   . Hypertension   . Fatigue   . Dizziness   . Hypothyroidism   . Normal nuclear stress test Ju;y 2012    No ischemia. EF 70%; fixed defect involving septum, inferoseptal and inferior wall  . Tobacco abuse   . Retroperitoneal bleeding     Following cardiac cath  . GERD (gastroesophageal reflux disease)   . Headache     UNRUPTURED CEREBRAL ANEURYSM  . Myocardial infarction   . Blood transfusion   . Anxiety     ON Reino Kent    Past Surgical History  Procedure Date  . Cardiac catheterization 01/02/2007    IT REVEALS MILD INFERIOR WALL HYPOKINESIS. THE EJECTION FRACTION IS AROUND 50%  . Coronary artery bypass graft 2008    LIMA to LAD, SVG to DX, SVG to LCX & SVG to OM 1 & 2, and SVG to PD  . Coronary stent placement     Remote past stent to RCA  . Hemorrhoid surgery 1989    Family History  Problem Relation Age of Onset  . Heart disease Neg Hx   . Cancer Mother 57  . Diabetes Mother   . Alcohol abuse Father   . Asthma Father   . Diabetes Brother   . Cancer Brother     lung  cancer  . Cancer Maternal Aunt     ovarian ca  . Cancer Paternal Aunt     liver cancer   Social History:  reports that she has been smoking Cigarettes.  She has a 13.25 pack-year smoking history. She does not have any smokeless tobacco history on file. She reports that she does not drink alcohol or use illicit drugs.  Allergies:  Allergies  Allergen Reactions  . Ace Inhibitors Cough  . Penicillins   . Sulfa Drugs Cross Reactors Rash    Medications Prior to Admission  Medication Sig Dispense Refill  . ALPRAZolam (XANAX) 0.5 MG tablet Take 1 tablet (0.5 mg total) by mouth at bedtime as needed.  30 tablet  0  . aspirin 81 MG tablet Take 81 mg by mouth daily.        . clopidogrel (PLAVIX) 75 MG tablet Take 75 mg by mouth daily.        . hydrochlorothiazide 25 MG tablet Take 1 tablet (25 mg total) by mouth daily.  90 tablet  3  . levothyroxine (SYNTHROID, LEVOTHROID) 100 MCG tablet Take 1 tablet (100 mcg total) by mouth daily.  30 tablet  3  . losartan (COZAAR) 100 MG tablet Take 1 tablet (100 mg total) by mouth daily.  90 tablet  3  . metoprolol (LOPRESSOR)  50 MG tablet TAKE ONE TABLET BY MOUTH EVERY DAY  30 tablet  5  . PARoxetine (PAXIL) 40 MG tablet Take 1 tablet (40 mg total) by mouth every morning.  30 tablet  3  . simvastatin (ZOCOR) 80 MG tablet Take 0.5 tablets (40 mg total) by mouth at bedtime.  30 tablet  12  . nitroGLYCERIN (NITROSTAT) 0.4 MG SL tablet Place 1 tablet (0.4 mg total) under the tongue every 5 (five) minutes as needed for chest pain.  25 tablet  12  . omeprazole (PRILOSEC) 40 MG capsule Take 40 mg by mouth daily.        . potassium chloride (K-DUR) 10 MEQ tablet Take 1 tablet (10 mEq total) by mouth daily.  90 tablet  3   Medications Prior to Admission  Medication Dose Route Frequency Provider Last Rate Last Dose  . aspirin EC tablet 325 mg  325 mg Oral 60 min Pre-Op Delton See, PA      . clopidogrel (PLAVIX) tablet 75 mg  75 mg Oral 60 min Pre-Op Delton See, PA      . midazolam (VERSED) 5 MG/5ML injection    PRN Hurshel Keys Weaver   1 mg at 10/05/11 0730  . niMODipine (NIMOTOP) capsule 60 mg  60 mg Oral 60 min Pre-Op David Rinehuls, PA      . nitroGLYCERIN 1.5 mg in sodium chloride 0.9 % 60 mL (25 mcg/mL) syringe  1.5 mg Intra-arterial to XRAY Sanjeev K Deveshwar      . DISCONTD: 0.9 %  sodium chloride infusion   Intravenous Continuous Delton See, PA        Results for orders placed during the hospital encounter of 10/03/11 (from the past 48 hour(s))  APTT     Status: Normal   Collection Time   10/03/11  2:04 PM      Component Value Range Comment   aPTT 29  24 - 37 (seconds)   CBC     Status: Abnormal   Collection Time   10/03/11  2:04 PM      Component Value Range Comment   WBC 11.2 (*) 4.0 - 10.5 (K/uL)    RBC 4.66  3.87 - 5.11 (MIL/uL)    Hemoglobin 15.1 (*) 12.0 - 15.0 (g/dL)    HCT 11.9  14.7 - 82.9 (%)    MCV 95.7  78.0 - 100.0 (fL)    MCH 32.4  26.0 - 34.0 (pg)    MCHC 33.9  30.0 - 36.0 (g/dL)    RDW 56.2  13.0 - 86.5 (%)    Platelets 259  150 - 400 (K/uL)   COMPREHENSIVE METABOLIC PANEL     Status: Abnormal   Collection Time   10/03/11  2:04 PM      Component Value Range Comment   Sodium 142  135 - 145 (mEq/L)    Potassium 4.0  3.5 - 5.1 (mEq/L)    Chloride 103  96 - 112 (mEq/L)    CO2 29  19 - 32 (mEq/L)    Glucose, Bld 90  70 - 99 (mg/dL)    BUN 10  6 - 23 (mg/dL)    Creatinine, Ser 7.84  0.50 - 1.10 (mg/dL)    Calcium 69.6  8.4 - 10.5 (mg/dL)    Total Protein 7.3  6.0 - 8.3 (g/dL)    Albumin 3.9  3.5 - 5.2 (g/dL)    AST 15  0 - 37 (U/L)    ALT 10  0 -  35 (U/L)    Alkaline Phosphatase 66  39 - 117 (U/L)    Total Bilirubin 0.4  0.3 - 1.2 (mg/dL)    GFR calc non Af Amer 61 (*) >90 (mL/min)    GFR calc Af Amer 71 (*) >90 (mL/min)   DIFFERENTIAL     Status: Normal   Collection Time   10/03/11  2:04 PM      Component Value Range Comment   Neutrophils Relative 55  43 - 77 (%)    Neutro Abs 6.2  1.7 - 7.7  (K/uL)    Lymphocytes Relative 35  12 - 46 (%)    Lymphs Abs 3.9  0.7 - 4.0 (K/uL)    Monocytes Relative 7  3 - 12 (%)    Monocytes Absolute 0.8  0.1 - 1.0 (K/uL)    Eosinophils Relative 2  0 - 5 (%)    Eosinophils Absolute 0.2  0.0 - 0.7 (K/uL)    Basophils Relative 1  0 - 1 (%)    Basophils Absolute 0.1  0.0 - 0.1 (K/uL)   PROTIME-INR     Status: Normal   Collection Time   10/03/11  2:04 PM      Component Value Range Comment   Prothrombin Time 12.9  11.6 - 15.2 (seconds)    INR 0.95  0.00 - 1.49    PLATELET INHIBITION P2Y12     Status: Abnormal   Collection Time   10/05/11  6:37 AM      Component Value Range Comment   Platelet Function  P2Y12 130 (*) 194 - 418 (PRU)    Dg Chest 2 View  10/03/2011  *RADIOLOGY REPORT*  Clinical Data: Hypertension, cerebral aneurysm, smoker, preoperative respiratory exam  CHEST - 2 VIEW  Comparison: May 12, 2011  Findings: The cardiac silhouette, mediastinum, pulmonary vasculature are within normal limits.  Both lungs are clear.  There has been a prior CABG. There is no acute bony abnormality.  IMPRESSION: Stable chest x-ray with no evidence of acute cardiac or pulmonary process.  Original Report Authenticated By: Brandon Melnick, M.D.    Review of Systems  Constitutional: Positive for malaise/fatigue. Negative for fever and chills.  HENT:       Chronic cough with post nasal drip   Eyes: Negative.  Negative for blurred vision, double vision and photophobia.  Respiratory: Positive for cough. Negative for hemoptysis, sputum production and shortness of breath.   Cardiovascular: Negative for chest pain, palpitations and leg swelling.  Gastrointestinal: Positive for heartburn. Negative for nausea, vomiting and abdominal pain.  Genitourinary: Negative.   Musculoskeletal: Negative.   Skin: Negative.   Neurological: Positive for dizziness and headaches. Negative for tremors, sensory change, speech change, focal weakness, seizures and loss of  consciousness.  Endo/Heme/Allergies: Positive for environmental allergies. Bruises/bleeds easily.  Psychiatric/Behavioral: Positive for depression. Negative for suicidal ideas and memory loss. The patient is nervous/anxious. The patient does not have insomnia.     Blood pressure 155/90, pulse 55, temperature 97.8 F (36.6 C), temperature source Oral, resp. rate 20, SpO2 95.00%. Physical Exam  Constitutional: She is oriented to person, place, and time. She appears well-developed.  HENT:  Head: Normocephalic and atraumatic.  Mouth/Throat: No oropharyngeal exudate.  Eyes: EOM are normal. Pupils are equal, round, and reactive to light.  Neck: Normal range of motion. Neck supple.  Cardiovascular: Normal rate, regular rhythm, normal heart sounds and intact distal pulses.  Exam reveals no gallop and no friction rub.   No murmur  heard. Respiratory: Effort normal. She has no wheezes. She has no rales. She exhibits no tenderness.       Decreased breath sounds bilaterally; but CTA   GI: Bowel sounds are normal. She exhibits no distension. There is no tenderness.  Lymphadenopathy:    She has no cervical adenopathy.  Neurological: She is alert and oriented to person, place, and time.  Skin: Skin is warm and dry.  Psychiatric: Judgment and thought content normal.     Assessment/Plan Cerebral aneurysm involving the basilar artery with single episode of presyncopal event. Proceed with formal angiogram and possible intervention. If aneurysm intervened on - patient will be admitted to the NICU overnight.   CAMPBELL,PAMELA D 10/05/2011, 8:00 AM

## 2011-10-05 NOTE — Procedures (Signed)
S/P 4 vessel cerebral arteriogram,followed by stent assisted coiling of basilar apex aneurysm Under GA  Full report to follow.Marland Kitchen

## 2011-10-05 NOTE — Code Documentation (Signed)
61 yo female who had just completed a cerebral aneurysm coiling; at the end of the case, pt was extubated, but unresponsive at 1438. Code stroke was activated at 1444. Stroke team arrived at 1440 (called prior to activation).  Pt was LSN at 0915. Taken to MRI which did not show evidence of stroke. Beginning to arouse, follow some commands, move all 4's.  Taken to 3100 for observation and care; handoff done. Dr. Thad Ranger here.

## 2011-10-05 NOTE — Transfer of Care (Signed)
Immediate Anesthesia Transfer of Care Note  Patient: Jill [name]  Procedure(s) Performed: * No procedures listed *  Patient Location: PACU and ICU  Anesthesia Type: General  Level of Consciousness: awake  Airway & Oxygen Therapy: Patient Spontanous Breathing  Post-op Assessment: Post -op Vital signs reviewed and stable  Post vital signs: stable  Complications: No apparent anesthesia complications, slow to wake up, initiated code stroke per Dr. Corliss Skains.  Pt. Follows all commands once in ICU

## 2011-10-05 NOTE — Progress Notes (Addendum)
PHARMACY - ANTICOAGULATION CONSULT INITIAL NOTE  Pharmacy Consult for: heparin  Indication: Post-interventional neuroradiological procedure    Patient Data:   Allergies: Allergies  Allergen Reactions  . Ace Inhibitors Cough  . Penicillins   . Sulfa Drugs Cross Reactors Rash    Patient Measurements: Height: 5\' 7"  (170.2 cm) Weight: 148 lb 5.9 oz (67.3 kg) IBW/kg (Calculated) : 61.6  Adjusted Body Weight: 67.3 kg  Vital Signs: Temp:  [97.8 F (36.6 C)] 97.8 F (36.6 C) (11/29 8469) Pulse Rate:  [55] 55  (11/29 0648) Resp:  [20] 20  (11/29 0648) BP: (155)/(90) 155/90 mmHg (11/29 0648) SpO2:  [95 %] 95 % (11/29 0648) Weight:  [148 lb 5.9 oz (67.3 kg)] 148 lb 5.9 oz (67.3 kg) (11/29 1300)  Intake/Output from previous day:  Intake/Output Summary (Last 24 hours) at 10/05/11 1545 Last data filed at 10/05/11 1245  Gross per 24 hour  Intake   2000 ml  Output    600 ml  Net   1400 ml    Labs:  Basename 10/03/11 1404  HGB 15.1*  HCT 44.6  PLT 259  APTT 29  LABPROT 12.9  INR 0.95  HEPARINUNFRC --  CREATININE 0.97  CKTOTAL --  CKMB --  TROPONINI --   Estimated Creatinine Clearance: 58.5 ml/min (by C-G formula based on Cr of 0.97).  Medical History: Past Medical History  Diagnosis Date  . Coronary artery disease     Prior inferior MI with stent to RCA, s/p CABG in 2008  . Hyperlipidemia   . Hypertension   . Fatigue   . Dizziness   . Hypothyroidism   . Normal nuclear stress test Ju;y 2012    No ischemia. EF 70%; fixed defect involving septum, inferoseptal and inferior wall  . Tobacco abuse   . Retroperitoneal bleeding     Following cardiac cath  . GERD (gastroesophageal reflux disease)   . Headache     UNRUPTURED CEREBRAL ANEURYSM  . Myocardial infarction   . Blood transfusion   . Anxiety     ON PAXIL, XANAX    Scheduled medications:     . vancomycin  1,000 mg Intravenous Once  . vancomycin  1,000 mg Intravenous Once  . DISCONTD: nitroGLYCERIN  1.5mg /49ml(25 mcg/ml)-FOR IR  1.5 mg Intra-arterial to XRAY     Assessment:  60 y.o. female admitted on 10/05/2011. Pt post-interventional neuroradiological procedure and pharmacy consulted to manage IV heparin.   Goal of Therapy:  1. Heparin level 0.1-0.25  Plan:  1. Start IV heparin infusion at 650 units/hr.  2. Heparin level in 6 hours.  3. D/c heparin at 07:00 11/30 for sheath removal.    Dineen Kid. Thad Ranger, PharmD 10/05/2011, 3:45 PM

## 2011-10-06 ENCOUNTER — Encounter (HOSPITAL_COMMUNITY)
Admission: RE | Disposition: A | Discharge status: Home / Self Care | Payer: Self-pay | Source: Ambulatory Visit | Attending: Interventional Radiology

## 2011-10-06 ENCOUNTER — Encounter (HOSPITAL_COMMUNITY): Payer: Self-pay | Admitting: Anesthesiology

## 2011-10-06 ENCOUNTER — Telehealth: Payer: Self-pay | Admitting: Cardiovascular Disease

## 2011-10-06 ENCOUNTER — Inpatient Hospital Stay (HOSPITAL_COMMUNITY): Payer: Medicaid Other

## 2011-10-06 ENCOUNTER — Inpatient Hospital Stay (HOSPITAL_COMMUNITY): Payer: Medicaid Other | Admitting: Anesthesiology

## 2011-10-06 DIAGNOSIS — G911 Obstructive hydrocephalus: Secondary | ICD-10-CM | POA: Diagnosis not present

## 2011-10-06 HISTORY — PX: VENTRICULOSTOMY: SHX5377

## 2011-10-06 LAB — BASIC METABOLIC PANEL
Calcium: 8 mg/dL — ABNORMAL LOW (ref 8.4–10.5)
GFR calc Af Amer: >90 mL/min (ref >90)
GFR calc non Af Amer: >90 mL/min (ref >90)
Glucose, Bld: 151 mg/dL — ABNORMAL HIGH (ref 70–99)
Sodium: 140 mEq/L (ref 135–145)

## 2011-10-06 LAB — URINE MICROSCOPIC-ADD ON

## 2011-10-06 LAB — CBC
MCH: 32.1 pg (ref 26.0–34.0)
MCHC: 33.7 g/dL (ref 30.0–36.0)
MCV: 95.1 fL (ref 78.0–100.0)
Platelets: 211 10*3/uL (ref 150–400)
RDW: 12.8 % (ref 11.5–15.5)

## 2011-10-06 LAB — GLUCOSE, CAPILLARY: Glucose-Capillary: 144 mg/dL — ABNORMAL HIGH (ref 70–99)

## 2011-10-06 LAB — URINALYSIS, ROUTINE W REFLEX MICROSCOPIC
Bilirubin Urine: NEGATIVE
Glucose, UA: NEGATIVE mg/dL
Protein, ur: NEGATIVE mg/dL

## 2011-10-06 LAB — MRSA PCR SCREENING: MRSA by PCR: NEGATIVE

## 2011-10-06 LAB — HEPARIN LEVEL (UNFRACTIONATED): Heparin Unfractionated: <0.1 IU/mL — ABNORMAL LOW (ref 0.30–0.70)

## 2011-10-06 LAB — DIFFERENTIAL
Basophils Absolute: 0 10*3/uL (ref 0.0–0.1)
Basophils Relative: 0 % (ref 0–1)
Eosinophils Absolute: 0 10*3/uL (ref 0.0–0.7)
Eosinophils Relative: 0 % (ref 0–5)

## 2011-10-06 SURGERY — VENTRICULOSTOMY
Anesthesia: Monitor Anesthesia Care | Site: Head | Laterality: Right | Wound class: Clean

## 2011-10-06 MED ORDER — PANTOPRAZOLE SODIUM 40 MG PO TBEC
40.0000 mg | DELAYED_RELEASE_TABLET | Freq: Every day | ORAL | Status: DC
  Administered 2011-10-07 – 2011-10-14 (×8): 40 mg via ORAL
  Filled 2011-10-06 (×8): qty 1

## 2011-10-06 MED ORDER — ROSUVASTATIN CALCIUM 20 MG PO TABS
20.0000 mg | ORAL_TABLET | Freq: Every day | ORAL | Status: DC
  Administered 2011-10-07 – 2011-10-21 (×15): 20 mg via ORAL
  Filled 2011-10-06 (×17): qty 1

## 2011-10-06 MED ORDER — VANCOMYCIN HCL IN DEXTROSE 1-5 GM/200ML-% IV SOLN
1000.0000 mg | Freq: Two times a day (BID) | INTRAVENOUS | Status: DC
  Administered 2011-10-06 – 2011-10-14 (×17): 1000 mg via INTRAVENOUS
  Filled 2011-10-06 (×19): qty 200

## 2011-10-06 MED ORDER — HYDROCHLOROTHIAZIDE 25 MG PO TABS
25.0000 mg | ORAL_TABLET | Freq: Every day | ORAL | Status: DC
  Administered 2011-10-07 – 2011-10-22 (×16): 25 mg via ORAL
  Filled 2011-10-06 (×16): qty 1

## 2011-10-06 MED ORDER — PROPOFOL 10 MG/ML IV EMUL
INTRAVENOUS | Status: DC | PRN
  Administered 2011-10-06 (×5): 20 mg via INTRAVENOUS

## 2011-10-06 MED ORDER — DEXAMETHASONE SODIUM PHOSPHATE 10 MG/ML IJ SOLN
2.0000 mg | Freq: Three times a day (TID) | INTRAMUSCULAR | Status: DC
  Administered 2011-10-06 – 2011-10-07 (×2): via INTRAVENOUS
  Administered 2011-10-07: 2 mg via INTRAVENOUS
  Filled 2011-10-06 (×2): qty 1
  Filled 2011-10-06 (×2): qty 0.2
  Filled 2011-10-06: qty 1
  Filled 2011-10-06: qty 0.2

## 2011-10-06 MED ORDER — LIDOCAINE-EPINEPHRINE 0.5-1:200000 % IJ SOLN
INTRAMUSCULAR | Status: DC | PRN
  Administered 2011-10-06: 6 mL

## 2011-10-06 MED ORDER — LOSARTAN POTASSIUM 50 MG PO TABS
100.0000 mg | ORAL_TABLET | Freq: Every day | ORAL | Status: DC
  Administered 2011-10-07 – 2011-10-22 (×16): 100 mg via ORAL
  Filled 2011-10-06 (×16): qty 2

## 2011-10-06 MED ORDER — SODIUM CHLORIDE 0.9 % IR SOLN
Status: DC | PRN
  Administered 2011-10-06: 1000 mL

## 2011-10-06 MED ORDER — ASPIRIN 81 MG PO CHEW
81.0000 mg | CHEWABLE_TABLET | Freq: Every day | ORAL | Status: DC
  Administered 2011-10-07: 81 mg via ORAL
  Filled 2011-10-06: qty 1

## 2011-10-06 MED ORDER — DEXAMETHASONE SODIUM PHOSPHATE 10 MG/ML IJ SOLN
8.0000 mg | Freq: Once | INTRAMUSCULAR | Status: AC
  Administered 2011-10-06: 09:00:00 via INTRAVENOUS
  Filled 2011-10-06: qty 1

## 2011-10-06 MED ORDER — METOPROLOL TARTRATE 50 MG PO TABS
50.0000 mg | ORAL_TABLET | Freq: Every day | ORAL | Status: DC
  Administered 2011-10-07 – 2011-10-12 (×5): 50 mg via ORAL
  Filled 2011-10-06 (×8): qty 1

## 2011-10-06 MED ORDER — HEPARIN SOD (PORCINE) IN D5W 100 UNIT/ML IV SOLN
550.0000 [IU]/h | INTRAVENOUS | Status: DC
  Filled 2011-10-06: qty 250

## 2011-10-06 MED ORDER — CEFAZOLIN SODIUM 1-5 GM-% IV SOLN
INTRAVENOUS | Status: AC
  Filled 2011-10-06: qty 50

## 2011-10-06 MED ORDER — ALPRAZOLAM 0.5 MG PO TABS: 0.5000 mg | ORAL_TABLET | Freq: Every evening | ORAL | Status: DC | PRN

## 2011-10-06 MED ORDER — CEFAZOLIN SODIUM 1-5 GM-% IV SOLN
INTRAVENOUS | Status: DC | PRN
  Administered 2011-10-06: 1 g via INTRAVENOUS

## 2011-10-06 MED ORDER — SODIUM CHLORIDE 0.9 % IV SOLN
INTRAVENOUS | Status: DC
  Administered 2011-10-06: 04:00:00 via INTRAVENOUS

## 2011-10-06 MED ORDER — DEXAMETHASONE SODIUM PHOSPHATE 4 MG/ML IJ SOLN
INTRAMUSCULAR | Status: DC | PRN
  Administered 2011-10-06: 8 mg via INTRAVENOUS

## 2011-10-06 MED ORDER — DIPHENHYDRAMINE HCL 50 MG/ML IJ SOLN
INTRAMUSCULAR | Status: DC | PRN
  Administered 2011-10-06: 25 mg via INTRAVENOUS

## 2011-10-06 MED ORDER — NITROGLYCERIN 0.4 MG SL SUBL: 0.4000 mg | SUBLINGUAL_TABLET | SUBLINGUAL | Status: DC | PRN

## 2011-10-06 MED ORDER — LEVOTHYROXINE SODIUM 100 MCG PO TABS
100.0000 ug | ORAL_TABLET | Freq: Every day | ORAL | Status: DC
  Administered 2011-10-07 – 2011-10-22 (×16): 100 ug via ORAL
  Filled 2011-10-06 (×19): qty 1

## 2011-10-06 MED ORDER — PAROXETINE HCL 20 MG PO TABS
40.0000 mg | ORAL_TABLET | ORAL | Status: DC
  Administered 2011-10-07 – 2011-10-22 (×16): 40 mg via ORAL
  Filled 2011-10-06 (×13): qty 2
  Filled 2011-10-06: qty 1
  Filled 2011-10-06 (×4): qty 2

## 2011-10-06 MED ORDER — LACTATED RINGERS IV SOLN
INTRAVENOUS | Status: DC | PRN
  Administered 2011-10-06: 18:00:00 via INTRAVENOUS

## 2011-10-06 MED ORDER — SODIUM CHLORIDE 0.9 % IV SOLN
INTRAVENOUS | Status: DC
  Administered 2011-10-07 – 2011-10-13 (×6): via INTRAVENOUS

## 2011-10-06 MED ORDER — POTASSIUM CHLORIDE 20 MEQ/15ML (10%) PO LIQD
40.0000 meq | Freq: Once | ORAL | Status: DC
  Filled 2011-10-06: qty 30

## 2011-10-06 MED ORDER — INSULIN ASPART 100 UNIT/ML ~~LOC~~ SOLN: 0.0000 [IU] | Freq: Every day | SUBCUTANEOUS | Status: DC

## 2011-10-06 MED ORDER — INSULIN ASPART 100 UNIT/ML ~~LOC~~ SOLN
0.0000 [IU] | Freq: Three times a day (TID) | SUBCUTANEOUS | Status: DC
  Administered 2011-10-06 (×2): 2 [IU] via SUBCUTANEOUS
  Filled 2011-10-06: qty 3

## 2011-10-06 MED ORDER — PANTOPRAZOLE SODIUM 40 MG IV SOLR
40.0000 mg | Freq: Once | INTRAVENOUS | Status: AC
  Administered 2011-10-06: 40 mg via INTRAVENOUS
  Filled 2011-10-06: qty 40

## 2011-10-06 MED ORDER — ALPRAZOLAM 0.5 MG PO TABS
0.5000 mg | ORAL_TABLET | Freq: Every evening | ORAL | Status: DC | PRN
  Administered 2011-10-10 – 2011-10-20 (×8): 0.5 mg via ORAL
  Filled 2011-10-06 (×9): qty 1

## 2011-10-06 MED ORDER — ASPIRIN 300 MG RE SUPP
300.0000 mg | Freq: Every day | RECTAL | Status: DC
  Administered 2011-10-06: 300 mg via RECTAL

## 2011-10-06 MED FILL — Nimodipine Cap 30 MG: ORAL | Qty: 1 | Status: AC

## 2011-10-06 MED FILL — Aspirin Tab Delayed Release 325 MG: ORAL | Qty: 100 | Status: AC

## 2011-10-06 SURGICAL SUPPLY — 83 items
BLADE SURG 15 STRL LF DISP TIS (BLADE) ×1 IMPLANT
BLADE SURG 15 STRL SS (BLADE) ×2
BRUSH SCRUB EZ 1% IODOPHOR (MISCELLANEOUS) ×2 IMPLANT
BRUSH SCRUB EZ PLAIN DRY (MISCELLANEOUS) ×2 IMPLANT
BUR ACORN 6.0 PRECISION (BURR) ×1 IMPLANT
CANISTER SUCTION 2500CC (MISCELLANEOUS) ×2 IMPLANT
CATH VENTRIC 35X38 W/TROCAR LG (CATHETERS) ×1 IMPLANT
CLIP RANEY DISP (INSTRUMENTS) ×1 IMPLANT
CLOTH BEACON ORANGE TIMEOUT ST (SAFETY) ×2 IMPLANT
CONT SPEC 4OZ CLIKSEAL STRL BL (MISCELLANEOUS) ×2 IMPLANT
CORDS BIPOLAR (ELECTRODE) ×3 IMPLANT
COVER TABLE BACK 60X90 (DRAPES) ×2 IMPLANT
DECANTER SPIKE VIAL GLASS SM (MISCELLANEOUS) ×2 IMPLANT
DRAPE INCISE IOBAN 66X45 STRL (DRAPES) ×1 IMPLANT
DRAPE POUCH INSTRU U-SHP 10X18 (DRAPES) ×1 IMPLANT
DRAPE PROXIMA HALF (DRAPES) ×2 IMPLANT
DRESSING TELFA 8X3 (GAUZE/BANDAGES/DRESSINGS) ×1 IMPLANT
DRSG OPSITE 4X5.5 SM (GAUZE/BANDAGES/DRESSINGS) ×1 IMPLANT
DRSG OPSITE 6X11 MED (GAUZE/BANDAGES/DRESSINGS) ×1 IMPLANT
DURAPREP 6ML APPLICATOR 50/CS (WOUND CARE) ×2 IMPLANT
ELECT CAUTERY BLADE 6.4 (BLADE) ×1 IMPLANT
ELECT REM PT RETURN 9FT ADLT (ELECTROSURGICAL) ×2
ELECTRODE REM PT RTRN 9FT ADLT (ELECTROSURGICAL) ×1 IMPLANT
GAUZE SPONGE 4X4 16PLY XRAY LF (GAUZE/BANDAGES/DRESSINGS) ×2 IMPLANT
GLOVE BIO SURGEON STRL SZ 6.5 (GLOVE) ×1 IMPLANT
GLOVE BIO SURGEON STRL SZ7 (GLOVE) IMPLANT
GLOVE BIO SURGEON STRL SZ7.5 (GLOVE) IMPLANT
GLOVE BIO SURGEON STRL SZ8 (GLOVE) IMPLANT
GLOVE BIO SURGEON STRL SZ8.5 (GLOVE) IMPLANT
GLOVE BIOGEL M 8.0 STRL (GLOVE) IMPLANT
GLOVE BIOGEL PI IND STRL 6.5 (GLOVE) IMPLANT
GLOVE BIOGEL PI IND STRL 7.5 (GLOVE) IMPLANT
GLOVE BIOGEL PI INDICATOR 6.5 (GLOVE) ×1
GLOVE BIOGEL PI INDICATOR 7.5 (GLOVE) ×1
GLOVE ECLIPSE 6.5 STRL STRAW (GLOVE) ×3 IMPLANT
GLOVE ECLIPSE 7.0 STRL STRAW (GLOVE) IMPLANT
GLOVE ECLIPSE 7.5 STRL STRAW (GLOVE) ×1 IMPLANT
GLOVE ECLIPSE 8.0 STRL XLNG CF (GLOVE) IMPLANT
GLOVE EXAM NITRILE LRG STRL (GLOVE) IMPLANT
GLOVE EXAM NITRILE MD LF STRL (GLOVE) IMPLANT
GLOVE EXAM NITRILE XL STR (GLOVE) IMPLANT
GLOVE EXAM NITRILE XS STR PU (GLOVE) IMPLANT
GLOVE INDICATOR 6.5 STRL GRN (GLOVE) IMPLANT
GLOVE INDICATOR 7.0 STRL GRN (GLOVE) IMPLANT
GLOVE INDICATOR 7.5 STRL GRN (GLOVE) IMPLANT
GLOVE INDICATOR 8.0 STRL GRN (GLOVE) IMPLANT
GLOVE INDICATOR 8.5 STRL (GLOVE) IMPLANT
GLOVE OPTIFIT SS 8.0 STRL (GLOVE) IMPLANT
GLOVE SURG SS PI 6.5 STRL IVOR (GLOVE) IMPLANT
GOWN BRE IMP SLV AUR LG STRL (GOWN DISPOSABLE) ×2 IMPLANT
GOWN BRE IMP SLV AUR XL STRL (GOWN DISPOSABLE) ×1 IMPLANT
GOWN STRL REIN 2XL LVL4 (GOWN DISPOSABLE) IMPLANT
KIT BASIN OR (CUSTOM PROCEDURE TRAY) ×2 IMPLANT
KIT DRAIN CSF ACCUDRAIN (MISCELLANEOUS) ×1 IMPLANT
KIT ROOM TURNOVER OR (KITS) ×2 IMPLANT
NDL HYPO 18GX1.5 BLUNT FILL (NEEDLE) IMPLANT
NDL HYPO 25X1 1.5 SAFETY (NEEDLE) ×1 IMPLANT
NEEDLE HYPO 18GX1.5 BLUNT FILL (NEEDLE) IMPLANT
NEEDLE HYPO 25X1 1.5 SAFETY (NEEDLE) ×2 IMPLANT
NS IRRIG 1000ML POUR BTL (IV SOLUTION) ×2 IMPLANT
PACK EENT II TURBAN DRAPE (CUSTOM PROCEDURE TRAY) ×2 IMPLANT
PAD ARMBOARD 7.5X6 YLW CONV (MISCELLANEOUS) ×3 IMPLANT
PATTIES SURGICAL .5 X.5 (GAUZE/BANDAGES/DRESSINGS) ×1 IMPLANT
PATTIES SURGICAL 1X1 (DISPOSABLE) ×1 IMPLANT
PENCIL BUTTON HOLSTER BLD 10FT (ELECTRODE) ×1 IMPLANT
SET POST CRANIOTOMY SUBDURAL (MISCELLANEOUS) IMPLANT
SPONGE GAUZE 4X4 12PLY (GAUZE/BANDAGES/DRESSINGS) IMPLANT
SPONGE LAP 4X18 X RAY DECT (DISPOSABLE) ×1 IMPLANT
SUT ETHILON 3 0 PS 1 (SUTURE) ×3 IMPLANT
SUT SILK 2 0 FS (SUTURE) ×2 IMPLANT
SUT SILK 2 0 TIES 10X30 (SUTURE) ×1 IMPLANT
SUT VIC AB 2-0 CT2 18 VCP726D (SUTURE) ×1 IMPLANT
SUT VIC AB 3-0 SH 8-18 (SUTURE) ×1 IMPLANT
SYR 20ML ECCENTRIC (SYRINGE) ×2 IMPLANT
SYR BULB 3OZ (MISCELLANEOUS) ×2 IMPLANT
SYR BULB IRRIGATION 50ML (SYRINGE) ×1 IMPLANT
SYR CONTROL 10ML LL (SYRINGE) ×2 IMPLANT
TOWEL OR 17X24 6PK STRL BLUE (TOWEL DISPOSABLE) ×2 IMPLANT
TOWEL OR 17X26 10 PK STRL BLUE (TOWEL DISPOSABLE) ×2 IMPLANT
TRAY FOLEY CATH 14FRSI W/METER (CATHETERS) IMPLANT
TUBE CONNECTING 12X1/4 (SUCTIONS) ×1 IMPLANT
UNDERPAD 30X30 INCONTINENT (UNDERPADS AND DIAPERS) IMPLANT
WATER STERILE IRR 1000ML POUR (IV SOLUTION) ×2 IMPLANT

## 2011-10-06 NOTE — Progress Notes (Signed)
Subjective: Basilar tip aneurysm stent/coil performed 11/29 by Dr Corliss Skains. Procedure went well but pt slow to wake up from anesthesia. Code stroke called Post procedure MRI showed no new CVA Overnight stay uneventful although pt has been sluggish, But aware of time/place/person/ situation. With neuro intact Change in status this am; onset of H/A over Rt eye, nausea, vomit at 645am BP has stayed within perameters Now very lethargic; unable to answer correctly day/situation/place. Dr Dev aware  Objective: Vital signs in last 24 hours: Pulse Rate:  [49-75] 67  (11/30 0730) Resp:  [18-25] 22  (11/30 0730) BP: (104-131)/(41-60) 131/52 mmHg (11/30 0700) SpO2:  [90 %-100 %] 97 % (11/30 0730) Arterial Line BP: (100-168)/(39-70) 158/48 mmHg (11/30 0730) Weight:  [148 lb 5.9 oz (67.3 kg)-161 lb 9.6 oz (73.3 kg)] 161 lb 9.6 oz (73.3 kg) (11/29 2000)    Intake/Output from previous day: 11/29 0701 - 11/30 0700 In: 2295.7 [P.O.:30; I.V.:2065.7; IV Piggyback:200] Out: 2575 [Urine:2575] Intake/Output this shift:    PE:  Pt very lethargic I am able to awake enough to follow commands Equal strength Bilat ; upper and lower extrs Pt unable to answer correctly time/place/situation but does know her name Rt groin NT/no bleeding; no hematoma 2+ pulses   Lab Results:   [facility] 10/06/11 0340 10/05/11 2211  WBC 14.8* 19.2*  HGB 11.8* 12.5  HCT 35.0* 36.8  PLT 211 223   BMET  Basename 10/06/11 0340 10/03/11 1404  NA 140 142  K 3.1* 4.0  CL 105 103  CO2 25 29  GLUCOSE 151* 90  BUN 9 10  CREATININE 0.72 0.97  CALCIUM 8.0* 10.1   PT/INR  Basename 10/03/11 1404  LABPROT 12.9  INR 0.95   ABG No results found for this basename: PHART:2,PCO2:2,PO2:2,HCO3:2 in the last 72 hours  Studies/Results: Mr Angiogram Head Wo Contrast  10/05/2011  *RADIOLOGY REPORT*  Clinical Data:  Status post coiling of basilar tip aneurysm.  The patient is slow to wake from anesthesia.  MRI HEAD  WITHOUT CONTRAST MRA HEAD WITHOUT CONTRAST  Technique:  Multiplanar, multiecho pulse sequences of the brain and surrounding structures were obtained without intravenous contrast. Angiographic images of the head were obtained using MRA technique without contrast.  Comparison:  CT angiogram of the head 09/08/2011 and cerebral arteriogram 10/05/2011.  Both studies are performed at [facility].  MRI HEAD  Findings:  The diffusion weighted images demonstrate no evidence for acute or subacute infarction.  An axial T2 sequence was performed.  It is signify degraded by patient motion.  The there does appear to be flow within the major intracranial arteries. Right sphenoid sinus disease is noted.  Minimal mucosal thickening is present in the maxillary sinuses bilaterally.  IMPRESSION:  1.  No evidence for acute or subacute infarction. 2.  Sphenoid sinus disease.  This may be secondary to the patient's intubated status.  MRA HEAD  Findings: Atherosclerotic irregularity is present through the cavernous carotid arteries bilaterally.  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  The A1 and M1 segments are normal.  The left A1 and M1 segments are normal. There is early bifurcation of the right MCA, a normal variant.  ACA and MCA branch vessels are normal.  The vertebral arteries are codominant.  The PICA origins are visualized and normal bilaterally.  There is signal loss in the distal basilar artery and proximal left posterior cerebral artery secondary to the stent placement.  The more distal posterior cerebral arteries fill  normally.  Two left and one right superior cerebellar arteries are visualized.  No residual aneurysm is evident.  IMPRESSION:  1.  Signal loss in the distal basilar and proximal left posterior cerebral artery is artifactual secondary to stent placement. 2.  No definite residual aneurysm. 3.  More normal signal in the distal posterior cerebral arteries suggests flow through  those areas. 4.  Normal appearance of the superior cerebellar arteries bilaterally. 5.  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  Original Report Authenticated By: Jamesetta Orleans. MATTERN, M.D.   Mr Brain Ltd W/o Cm  10/05/2011  *RADIOLOGY REPORT*  Clinical Data:  Status post coiling of basilar tip aneurysm.  The patient is slow to wake from anesthesia.  MRI HEAD WITHOUT CONTRAST MRA HEAD WITHOUT CONTRAST  Technique:  Multiplanar, multiecho pulse sequences of the brain and surrounding structures were obtained without intravenous contrast. Angiographic images of the head were obtained using MRA technique without contrast.  Comparison:  CT angiogram of the head 09/08/2011 and cerebral arteriogram 10/05/2011.  Both studies are performed at [facility].  MRI HEAD  Findings:  The diffusion weighted images demonstrate no evidence for acute or subacute infarction.  An axial T2 sequence was performed.  It is signify degraded by patient motion.  The there does appear to be flow within the major intracranial arteries. Right sphenoid sinus disease is noted.  Minimal mucosal thickening is present in the maxillary sinuses bilaterally.  IMPRESSION:  1.  No evidence for acute or subacute infarction. 2.  Sphenoid sinus disease.  This may be secondary to the patient's intubated status.  MRA HEAD  Findings: Atherosclerotic irregularity is present through the cavernous carotid arteries bilaterally.  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  The A1 and M1 segments are normal.  The left A1 and M1 segments are normal. There is early bifurcation of the right MCA, a normal variant.  ACA and MCA branch vessels are normal.  The vertebral arteries are codominant.  The PICA origins are visualized and normal bilaterally.  There is signal loss in the distal basilar artery and proximal left posterior cerebral artery secondary to the stent placement.  The more distal posterior cerebral  arteries fill normally.  Two left and one right superior cerebellar arteries are visualized.  No residual aneurysm is evident.  IMPRESSION:  1.  Signal loss in the distal basilar and proximal left posterior cerebral artery is artifactual secondary to stent placement. 2.  No definite residual aneurysm. 3.  More normal signal in the distal posterior cerebral arteries suggests flow through those areas. 4.  Normal appearance of the superior cerebellar arteries bilaterally. 5.  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  Original Report Authenticated By: Jamesetta Orleans. MATTERN, M.D.    Anti-infectives: Anti-infectives     Start     Dose/Rate Route Frequency Ordered Stop   10/05/11 1600   vancomycin (VANCOCIN) IVPB 1000 mg/200 mL premix        1,000 mg 200 mL/hr over 60 Minutes Intravenous  Once 10/05/11 1414 10/05/11 1725   10/05/11 0945   vancomycin (VANCOCIN) IVPB 1000 mg/200 mL premix        1,000 mg 200 mL/hr over 60 Minutes Intravenous  Once 10/05/11 0930 10/05/11 1017          Assessment/Plan: s/p  Basilar tip aneurysm stent/coil 11/29 Dr Corliss Skains aware of pt status and changes  LOS: 1 day    Melicia Esqueda A 10/06/2011

## 2011-10-06 NOTE — Progress Notes (Signed)
PHARMACY - ANTICOAGULATION CONSULT   Pharmacy Consult for: heparin  Indication: S/P aneurysm coiling    Patient Data:   Allergies: Allergies  Allergen Reactions  . Ace Inhibitors Cough  . Penicillins   . Sulfa Drugs Cross Reactors Rash    Patient Measurements: Height: 5\' 7"  (170.2 cm) Weight: 161 lb 9.6 oz (73.3 kg) IBW/kg (Calculated) : 61.6  Adjusted Body Weight: 67.3 kg  Vital Signs: Temp:  [99.2 F (37.3 C)-99.3 F (37.4 C)] 99.3 F (37.4 C) (11/30 1200) Pulse Rate:  [49-75] 66  (11/30 1400) Resp:  [18-25] 23  (11/30 1400) BP: (104-138)/(41-60) 138/55 mmHg (11/30 1400) SpO2:  [90 %-100 %] 95 % (11/30 1400) Arterial Line BP: (100-171)/(39-70) 171/54 mmHg (11/30 1400) Weight:  [161 lb 9.6 oz (73.3 kg)] 161 lb 9.6 oz (73.3 kg) (11/29 2000)  Intake/Output from previous day:  Intake/Output Summary (Last 24 hours) at 10/06/11 1454 Last data filed at 10/06/11 1400  Gross per 24 hour  Intake 413.73 ml  Output   2175 ml  Net -1761.27 ml    Labs:  Basename 10/06/11 0340 10/05/11 2212 10/05/11 2211  HGB 11.8* -- 12.5  HCT 35.0* -- 36.8  PLT 211 -- 223  APTT -- -- --  LABPROT -- -- --  INR -- -- --  HEPARINUNFRC <0.10* 0.46 --  CREATININE 0.72 -- --  CKTOTAL -- -- --  CKMB -- -- --  TROPONINI -- -- --   Estimated Creatinine Clearance: 70.9 ml/min (by C-G formula based on Cr of 0.72).  Medical History: Past Medical History  Diagnosis Date  . Coronary artery disease     Prior inferior MI with stent to RCA, s/p CABG in 2008  . Hyperlipidemia   . Hypertension   . Fatigue   . Dizziness   . Hypothyroidism   . Normal nuclear stress test Ju;y 2012    No ischemia. EF 70%; fixed defect involving septum, inferoseptal and inferior wall  . Tobacco abuse   . Retroperitoneal bleeding     Following cardiac cath  . GERD (gastroesophageal reflux disease)   . Headache     UNRUPTURED CEREBRAL ANEURYSM  . Myocardial infarction   . Blood transfusion   . Anxiety    ON PAXIL, XANAX    Scheduled medications:     . aspirin  300 mg Rectal Daily  . clopidogrel  75 mg Oral Q breakfast  . dexamethasone  2 mg Intravenous Q8H  . dexamethasone  8 mg Intravenous Once  . insulin aspart  0-15 Units Subcutaneous TID WC  . insulin aspart  0-5 Units Subcutaneous QHS  . ketorolac  30 mg Intravenous Once  . pantoprazole (PROTONIX) IV  40 mg Intravenous Once  . potassium chloride  40 mEq Oral Once  . vancomycin  1,000 mg Intravenous Once  . DISCONTD: aspirin  325 mg Oral Q breakfast     Assessment:  60 yo female s/p aneurysm coiling for heparin.  Heparin d/c and then restarted earlier today.   Goal of Therapy:  1. Heparin level 0.1-0.25  Plan:  1. Continue heparin at 550 units/hr 2. F/u 18:00 heparin level.  Emeline Gins 10/06/2011

## 2011-10-06 NOTE — OR Nursing (Signed)
Pre-op interview and informed consent performed by Reece Packer, RN.

## 2011-10-06 NOTE — Telephone Encounter (Signed)
Pt spouse needs to get her on disability because of the surgery she is about to have and want to talk to Dr. Elease Hashimoto regarding this because he has talked to him before about and Dr. Elease Hashimoto said he would help with this

## 2011-10-06 NOTE — Progress Notes (Signed)
Subjective: Patient was seen last evening and was easily alerted if name called and interacted appropriately.  This morning more lethargic and interactions not as appropriate.  Has not received sedation.  Objective: Vital signs in last 24 hours: Temp:  [99.2 F (37.3 C)] 99.2 F (37.3 C) (11/30 0759) Pulse Rate:  [49-75] 67  (11/30 0730) Resp:  [18-25] 22  (11/30 0730) BP: (104-131)/(41-60) 131/52 mmHg (11/30 0700) SpO2:  [90 %-100 %] 97 % (11/30 0730) Arterial Line BP: (100-168)/(39-70) 158/48 mmHg (11/30 0730) Weight:  [67.3 kg (148 lb 5.9 oz)-73.3 kg (161 lb 9.6 oz)] 161 lb 9.6 oz (73.3 kg) (11/29 2000)  Intake/Output from previous day: 11/29 0701 - 11/30 0700 In: 2295.7 [P.O.:30; I.V.:2065.7; IV Piggyback:200] Out: 2575 [Urine:2575] Intake/Output this shift:   Nutritional status:    Neurologic Exam: Mental Status: Lethargic.  Must be repeatedly alerted.  Reports she does not know to all questions concerning orientation to include where she is, the year, her age, the month.  Speech minimal.  Requires much reinforcement to follow simple commands and even with that has difficulty.  Seems to neglect the left. Cranial Nerves: II: visual fields difficult to test secondary to alertness but does not blink to left confrontation. Pupils reactive bilaterally III,IV, VI: ptosis not present, extraocular muscles extra-ocular motions intact from left to right but not always conjugate. V,VII: corneals intact bilaterally VIII: hearing normal bilaterally IX,X: gag reflex present XI: trapezius strength/neck flexion strength normal bilaterally XII: unable to test  Motor: Lifts all extremities against gravity but seems to do so better with the right than with the left. Tone and bulk:normal tone throughout; no atrophy noted Sensory: Pinprick and light touch intact throughout, bilaterally Deep Tendon Reflexes: 2+ and symmetric throughout Plantars: Right: upgoing   Left:  upgoing Cerebellar: Unable to test    Lab Results:  Basename 10/06/11 0340 10/05/11 2211 10/03/11 1404  WBC 14.8* 19.2* --  HGB 11.8* 12.5 --  HCT 35.0* 36.8 --  PLT 211 223 --  NA 140 -- 142  K 3.1* -- 4.0  CL 105 -- 103  CO2 25 -- 29  GLUCOSE 151* -- 90  BUN 9 -- 10  CREATININE 0.72 -- 0.97  CALCIUM 8.0* -- 10.1  LABA1C -- -- --    Studies/Results: Mr Angiogram Head Wo Contrast  10/05/2011  *RADIOLOGY REPORT*  Clinical Data:  Status post coiling of basilar tip aneurysm.  The patient is slow to wake from anesthesia.  MRI HEAD WITHOUT CONTRAST MRA HEAD WITHOUT CONTRAST  Technique:  Multiplanar, multiecho pulse sequences of the brain and surrounding structures were obtained without intravenous contrast. Angiographic images of the head were obtained using MRA technique without contrast.  Comparison:  CT angiogram of the head 09/08/2011 and cerebral arteriogram 10/05/2011.  Both studies are performed at [facility].  MRI HEAD  Findings:  The diffusion weighted images demonstrate no evidence for acute or subacute infarction.  An axial T2 sequence was performed.  It is signify degraded by patient motion.  The there does appear to be flow within the major intracranial arteries. Right sphenoid sinus disease is noted.  Minimal mucosal thickening is present in the maxillary sinuses bilaterally.  IMPRESSION:  1.  No evidence for acute or subacute infarction. 2.  Sphenoid sinus disease.  This may be secondary to the patient's intubated status.  MRA HEAD  Findings: Atherosclerotic irregularity is present through the cavernous carotid arteries bilaterally.  A 3 mm right posterior communicating artery aneurysm is again  noted.  The A1 and M1 segments are normal.  The left A1 and M1 segments are normal. There is early bifurcation of the right MCA, a normal variant.  ACA and MCA branch vessels are normal.  The vertebral arteries are codominant.  The PICA origins are visualized and normal  bilaterally.  There is signal loss in the distal basilar artery and proximal left posterior cerebral artery secondary to the stent placement.  The more distal posterior cerebral arteries fill normally.  Two left and one right superior cerebellar arteries are visualized.  No residual aneurysm is evident.  IMPRESSION:  1.  Signal loss in the distal basilar and proximal left posterior cerebral artery is artifactual secondary to stent placement. 2.  No definite residual aneurysm. 3.  More normal signal in the distal posterior cerebral arteries suggests flow through those areas. 4.  Normal appearance of the superior cerebellar arteries bilaterally. 5.  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  Original Report Authenticated By: Jamesetta Orleans. MATTERN, M.D.   Mr Brain Ltd W/o Cm  10/05/2011  *RADIOLOGY REPORT*  Clinical Data:  Status post coiling of basilar tip aneurysm.  The patient is slow to wake from anesthesia.  MRI HEAD WITHOUT CONTRAST MRA HEAD WITHOUT CONTRAST  Technique:  Multiplanar, multiecho pulse sequences of the brain and surrounding structures were obtained without intravenous contrast. Angiographic images of the head were obtained using MRA technique without contrast.  Comparison:  CT angiogram of the head 09/08/2011 and cerebral arteriogram 10/05/2011.  Both studies are performed at [facility].  MRI HEAD  Findings:  The diffusion weighted images demonstrate no evidence for acute or subacute infarction.  An axial T2 sequence was performed.  It is signify degraded by patient motion.  The there does appear to be flow within the major intracranial arteries. Right sphenoid sinus disease is noted.  Minimal mucosal thickening is present in the maxillary sinuses bilaterally.  IMPRESSION:  1.  No evidence for acute or subacute infarction. 2.  Sphenoid sinus disease.  This may be secondary to the patient's intubated status.  MRA HEAD  Findings: Atherosclerotic irregularity is  present through the cavernous carotid arteries bilaterally.  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  The A1 and M1 segments are normal.  The left A1 and M1 segments are normal. There is early bifurcation of the right MCA, a normal variant.  ACA and MCA branch vessels are normal.  The vertebral arteries are codominant.  The PICA origins are visualized and normal bilaterally.  There is signal loss in the distal basilar artery and proximal left posterior cerebral artery secondary to the stent placement.  The more distal posterior cerebral arteries fill normally.  Two left and one right superior cerebellar arteries are visualized.  No residual aneurysm is evident.  IMPRESSION:  1.  Signal loss in the distal basilar and proximal left posterior cerebral artery is artifactual secondary to stent placement. 2.  No definite residual aneurysm. 3.  More normal signal in the distal posterior cerebral arteries suggests flow through those areas. 4.  Normal appearance of the superior cerebellar arteries bilaterally. 5.  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  Original Report Authenticated By: Jamesetta Orleans. MATTERN, M.D.    Medications:  I have reviewed the patient's current medications. Scheduled:   . aspirin  325 mg Oral Q breakfast  . clopidogrel  75 mg Oral Q breakfast  . dexamethasone  2 mg Intravenous Q8H  .  dexamethasone  8 mg Intravenous Once  . insulin aspart  0-15 Units Subcutaneous TID WC  . insulin aspart  0-5 Units Subcutaneous QHS  . ketorolac  30 mg Intravenous Once  . pantoprazole (PROTONIX) IV  40 mg Intravenous Once  . potassium chloride  40 mEq Oral Once  . vancomycin  1,000 mg Intravenous Once  . vancomycin  1,000 mg Intravenous Once  . DISCONTD: nitroGLYCERIN 1.5mg /56ml(25 mcg/ml)-FOR IR  1.5 mg Intra-arterial to XRAY    Assessment/Plan: Although the patient seemed to be doing better last evening making medication side effects the likely  reason for her poor responsiveness, this morning she is not near baseline and actually worse than she was last evening.  I am concerned at this point that there may very well be an infarct present that did not appear on the MR imaging of yesterday.  Further investigation is warranted. 1. MR of the brain to be repeated today without contrast to include thin slices through the brain stem and coronal images 2. Will continue to follow with you.   LOS: 1 day   Thana Farr, MD [facility] [phone] 10/06/2011  9:07 AM

## 2011-10-06 NOTE — Progress Notes (Signed)
Subjective: S/P Ventricular catheter placement   Objective: Vital signs in last 24 hours: Temp:  [98.6 F (37 C)-99.3 F (37.4 C)] 98.9 F (37.2 C) (11/30 1945) Pulse Rate:  [51-82] 79  (11/30 1825) Resp:  [19-25] 20  (11/30 1815) BP: (104-138)/(41-102) 124/74 mmHg (11/30 1815) SpO2:  [91 %-98 %] 94 % (11/30 1800) Arterial Line BP: (100-171)/(39-55) 171/54 mmHg (11/30 1400)  Intake/Output from previous day: 11/29 0701 - 11/30 0700 In: 2311.2 [P.O.:30; I.V.:2081.2; IV Piggyback:200] Out: 2575 [Urine:2575] Intake/Output this shift: Total I/O In: 500 [I.V.:500] Out: -   Neurologic: Mental status: alertness: alert, orientation: person Cranial nerves: II: pupils equal, round, reactive to light and accommodation, VII: upper facial muscle function normal bilaterally, VII: lower facial muscle function normal bilaterally, VIII: hearing intact to voice, IX: soft palate elevation normal bilaterally, XII: tongue strength normal  Motor: Normal  Lab Results:  Basename 10/06/11 0340 10/05/11 2211  WBC 14.8* 19.2*  HGB 11.8* 12.5  HCT 35.0* 36.8  PLT 211 223   BMET  Basename 10/06/11 0340  NA 140  K 3.1*  CL 105  CO2 25  GLUCOSE 151*  BUN 9  CREATININE 0.72  CALCIUM 8.0*    Studies/Results: Mr Angiogram Head Wo Contrast  10/05/2011  *RADIOLOGY REPORT*  Clinical Data:  Status post coiling of basilar tip aneurysm.  The patient is slow to wake from anesthesia.  MRI HEAD WITHOUT CONTRAST MRA HEAD WITHOUT CONTRAST  Technique:  Multiplanar, multiecho pulse sequences of the brain and surrounding structures were obtained without intravenous contrast. Angiographic images of the head were obtained using MRA technique without contrast.  Comparison:  CT angiogram of the head 09/08/2011 and cerebral arteriogram 10/05/2011.  Both studies are performed at [facility].  MRI HEAD  Findings:  The diffusion weighted images demonstrate no evidence for acute or subacute infarction.  An axial  T2 sequence was performed.  It is signify degraded by patient motion.  The there does appear to be flow within the major intracranial arteries. Right sphenoid sinus disease is noted.  Minimal mucosal thickening is present in the maxillary sinuses bilaterally.  IMPRESSION:  1.  No evidence for acute or subacute infarction. 2.  Sphenoid sinus disease.  This may be secondary to the patient's intubated status.  MRA HEAD  Findings: Atherosclerotic irregularity is present through the cavernous carotid arteries bilaterally.  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  The A1 and M1 segments are normal.  The left A1 and M1 segments are normal. There is early bifurcation of the right MCA, a normal variant.  ACA and MCA branch vessels are normal.  The vertebral arteries are codominant.  The PICA origins are visualized and normal bilaterally.  There is signal loss in the distal basilar artery and proximal left posterior cerebral artery secondary to the stent placement.  The more distal posterior cerebral arteries fill normally.  Two left and one right superior cerebellar arteries are visualized.  No residual aneurysm is evident.  IMPRESSION:  1.  Signal loss in the distal basilar and proximal left posterior cerebral artery is artifactual secondary to stent placement. 2.  No definite residual aneurysm. 3.  More normal signal in the distal posterior cerebral arteries suggests flow through those areas. 4.  Normal appearance of the superior cerebellar arteries bilaterally. 5.  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  Original Report Authenticated By: Jamesetta Orleans. MATTERN, M.D.   Mr Brain Wo Contrast  10/06/2011  *RADIOLOGY  REPORT*  Clinical Data: Altered mental status.  Prior aneurysm coiling.  MRI HEAD WITHOUT CONTRAST  Technique:  Multiplanar, multiecho pulse sequences of the brain and surrounding structures were obtained according to standard protocol without intravenous  contrast.  Comparison: Preoperative CT study.  MRI 10/05/2011.  Findings: The patient was moderately uncooperative and could not remain motionless for the study.  Images are suboptimal.  Small or subtle lesions could be overlooked.  Compared with prior cross-sectional studies, there has been development of acute hydrocephalus.  Fluid of diminished T2 signal intensity layers within the lateral ventricles suggesting there is acute intraventricular hemorrhage.  There is mildly increased FLAIR signal in the perimesencephalic cisterns, greater on the right and possibly also in the prepontine cisterns suggesting subarachnoid hemorrhage. Flow voids at the base the brain are preserved. Basilar tip aneurysm coil mass noted at the tip of the basilar.  Small foci of restricted diffusion can be seen in the superior vermis on the left, right occipital pole, and possibly left posterior temporal lobe suggesting acute ischemia.  Mild restricted diffusion also suspected in the perimesencephalic and superior vermian cisterns could be associated with subarachnoid bleeding.  IMPRESSION: Considerable motion degradation.  Suboptimal examination.  Concern raised for interval development of hydrocephalus and subarachnoid/interventricular hemorrhage following decline in mental status.  Consider follow up with CT scanning or formal catheter angiogram.  Foci of parenchymal restricted diffusion could reflect acute stroke.  Correlate clinically.  Original Report Authenticated By: Elsie Stain, M.D.   Mr Brain Ltd W/o Cm  10/05/2011  *RADIOLOGY REPORT*  Clinical Data:  Status post coiling of basilar tip aneurysm.  The patient is slow to wake from anesthesia.  MRI HEAD WITHOUT CONTRAST MRA HEAD WITHOUT CONTRAST  Technique:  Multiplanar, multiecho pulse sequences of the brain and surrounding structures were obtained without intravenous contrast. Angiographic images of the head were obtained using MRA technique without contrast.  Comparison:   CT angiogram of the head 09/08/2011 and cerebral arteriogram 10/05/2011.  Both studies are performed at [facility]  MRI HEAD  Findings:  The diffusion weighted images demonstrate no evidence for acute or subacute infarction.  An axial T2 sequence was performed.  It is signify degraded by patient motion.  The there does appear to be flow within the major intracranial arteries. Right sphenoid sinus disease is noted.  Minimal mucosal thickening is present in the maxillary sinuses bilaterally.  IMPRESSION:  1.  No evidence for acute or subacute infarction. 2.  Sphenoid sinus disease.  This may be secondary to the patient's intubated status.  MRA HEAD  Findings: Atherosclerotic irregularity is present through the cavernous carotid arteries bilaterally.  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  The A1 and M1 segments are normal.  The left A1 and M1 segments are normal. There is early bifurcation of the right MCA, a normal variant.  ACA and MCA branch vessels are normal.  The vertebral arteries are codominant.  The PICA origins are visualized and normal bilaterally.  There is signal loss in the distal basilar artery and proximal left posterior cerebral artery secondary to the stent placement.  The more distal posterior cerebral arteries fill normally.  Two left and one right superior cerebellar arteries are visualized.  No residual aneurysm is evident.  IMPRESSION:  1.  Signal loss in the distal basilar and proximal left posterior cerebral artery is artifactual secondary to stent placement. 2.  No definite residual aneurysm. 3.  More normal signal in the  distal posterior cerebral arteries suggests flow through those areas. 4.  Normal appearance of the superior cerebellar arteries bilaterally. 5.  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  Original Report Authenticated By: Jamesetta Orleans. MATTERN, M.D.    Assessment/Plan: Lethargic post procedure Ventricular  catheter draining well. Patient remains stable. Dressing dry and intact.  Pupils equal round reactive to light.  Moving all extremites.  LOS: 1 day     Deshae Dickison L 10/06/2011, 8:11 PM

## 2011-10-06 NOTE — Progress Notes (Signed)
PHARMACY - ANTICOAGULATION CONSULT   Pharmacy Consult for: heparin  Indication: S/P aneurysm coiling    Patient Data:   Allergies: Allergies  Allergen Reactions  . Ace Inhibitors Cough  . Penicillins   . Sulfa Drugs Cross Reactors Rash    Patient Measurements: Height: 5\' 7"  (170.2 cm) Weight: 161 lb 9.6 oz (73.3 kg) IBW/kg (Calculated) : 61.6  Adjusted Body Weight: 67.3 kg  Vital Signs: Temp:  [97.8 F (36.6 C)] 97.8 F (36.6 C) (11/29 0648) Pulse Rate:  [49-75] 63  (11/29 2330) Resp:  [18-23] 21  (11/29 2330) BP: (104-155)/(41-90) 104/44 mmHg (11/29 2315) SpO2:  [90 %-100 %] 95 % (11/29 2330) Arterial Line BP: (100-162)/(39-70) 127/43 mmHg (11/29 2330) Weight:  [148 lb 5.9 oz (67.3 kg)-161 lb 9.6 oz (73.3 kg)] 161 lb 9.6 oz (73.3 kg) (11/29 2000)  Intake/Output from previous day:  Intake/Output Summary (Last 24 hours) at 10/06/11 0011 Last data filed at 10/05/11 1800  Gross per 24 hour  Intake 2242.4 ml  Output   2000 ml  Net  242.4 ml    Labs:  Basename 10/05/11 2212 10/05/11 2211 10/03/11 1404  HGB -- 12.5 15.1*  HCT -- 36.8 44.6  PLT -- 223 259  APTT -- -- 29  LABPROT -- -- 12.9  INR -- -- 0.95  HEPARINUNFRC 0.46 -- --  CREATININE -- -- 0.97  CKTOTAL -- -- --  CKMB -- -- --  TROPONINI -- -- --   Estimated Creatinine Clearance: 58.5 ml/min (by C-G formula based on Cr of 0.97).  Medical History: Past Medical History  Diagnosis Date  . Coronary artery disease     Prior inferior MI with stent to RCA, s/p CABG in 2008  . Hyperlipidemia   . Hypertension   . Fatigue   . Dizziness   . Hypothyroidism   . Normal nuclear stress test Ju;y 2012    No ischemia. EF 70%; fixed defect involving septum, inferoseptal and inferior wall  . Tobacco abuse   . Retroperitoneal bleeding     Following cardiac cath  . GERD (gastroesophageal reflux disease)   . Headache     UNRUPTURED CEREBRAL ANEURYSM  . Myocardial infarction   . Blood transfusion   . Anxiety       ON PAXIL, XANAX    Scheduled medications:     . aspirin  325 mg Oral Q breakfast  . clopidogrel  75 mg Oral Q breakfast  . ketorolac  30 mg Intravenous Once  . vancomycin  1,000 mg Intravenous Once  . vancomycin  1,000 mg Intravenous Once  . DISCONTD: nitroGLYCERIN 1.5mg /8ml(25 mcg/ml)-FOR IR  1.5 mg Intra-arterial to XRAY     Assessment:  60 yo female s/p aneurysm coiling for Heparin.  Heparin level supratherapeutic.  Goal of Therapy:  1. Heparin level 0.1-0.25  Plan:  Decrease Heparin 550 units/hr  Eddie Candle 10/06/2011

## 2011-10-06 NOTE — Progress Notes (Signed)
0645 Pt vomited after a couple small sips of potassium chloride suspension.  Complained of severe headache.  Neuro change noted.  Oriented to person only.  Confused to place, year and situation.  Pupils reacted equally to light (sluggishly). Femoral sheath site intact, no bleeding or hematoma.  Pulses palpated. Moves all 4 extremities.   BP 135/51 from femoral A-Line.  Call Dr. Corliss Skains to make him aware of the neuro change.  He said to give Toradol and Zofran now.   When the nausea subsides, give ASA and Plavix.  I will chart that the K+ was not given due to vomiting.

## 2011-10-06 NOTE — Consult Note (Signed)
Reason for Consult decreased mental Referring Physician: deveshwar  Jill [name] is an 60 y.o. female.  HPI: admitted for endovasuclar coiling of an unruptured basilar tip Aneurysm on 10/05/2011. She received a stent in the basilar artery necessitating plavix post procedure. She woke up very slowly yesterday after her procedure and underwent an MRI and MRA. No infarcts were identified. She did improve with time and was following commands, moving all extremities prior to going to sleep last night. She awoke this morning once more lethargic. She had another MRI today which revealed acute ventricular enlargement, along with intraventricular blood. Intracerebral vasculature remained patent. I was called to place a ventricular catheter to treat the apparent ICP elevation.  Past Medical History  Diagnosis Date  . Coronary artery disease     Prior inferior MI with stent to RCA, s/p CABG in 2008  . Hyperlipidemia   . Hypertension   . Fatigue   . Dizziness   . Hypothyroidism   . Normal nuclear stress test Ju;y 2012    No ischemia. EF 70%; fixed defect involving septum, inferoseptal and inferior wall  . Tobacco abuse   . Retroperitoneal bleeding     Following cardiac cath  . GERD (gastroesophageal reflux disease)   . Headache     UNRUPTURED CEREBRAL ANEURYSM  . Myocardial infarction   . Blood transfusion   . Anxiety     ON Reino Kent    Past Surgical History  Procedure Date  . Cardiac catheterization 01/02/2007    IT REVEALS MILD INFERIOR WALL HYPOKINESIS. THE EJECTION FRACTION IS AROUND 50%  . Coronary artery bypass graft 2008    LIMA to LAD, SVG to DX, SVG to LCX & SVG to OM 1 & 2, and SVG to PD  . Coronary stent placement     Remote past stent to RCA  . Hemorrhoid surgery 1989    Family History  Problem Relation Age of Onset  . Heart disease Neg Hx   . Cancer Mother 89  . Diabetes Mother   . Alcohol abuse Father   . Asthma Father   . Diabetes Brother   . Cancer Brother     lung cancer  . Cancer Maternal Aunt     ovarian ca  . Cancer Paternal Aunt     liver cancer    Social History:  reports that she has been smoking Cigarettes.  She has a 13.25 pack-year smoking history. She does not have any smokeless tobacco history on file. She reports that she does not drink alcohol or use illicit drugs.  Allergies:  Allergies  Allergen Reactions  . Ace Inhibitors Cough  . Penicillins   . Sulfa Drugs Cross Reactors Rash    Medications: I have reviewed the patient's current medications.  Results for orders placed during the hospital encounter of 10/05/11 (from the past 48 hour(s))  POCT ACTIVATED CLOTTING TIME     Status: Normal   Collection Time   10/05/11 11:04 AM      Component Value Range Comment   Activated Clotting Time 221     POCT ACTIVATED CLOTTING TIME     Status: Normal   Collection Time   10/05/11 12:20 PM      Component Value Range Comment   Activated Clotting Time 276     POCT ACTIVATED CLOTTING TIME     Status: Normal   Collection Time   10/05/11  1:04 PM      Component Value Range Comment   Activated Clotting  Time 287     POCT ACTIVATED CLOTTING TIME     Status: Normal   Collection Time   10/05/11  1:25 PM      Component Value Range Comment   Activated Clotting Time 232     CBC     Status: Abnormal   Collection Time   10/05/11 10:11 PM      Component Value Range Comment   WBC 19.2 (*) 4.0 - 10.5 (K/uL)    RBC 3.86 (*) 3.87 - 5.11 (MIL/uL)    Hemoglobin 12.5  12.0 - 15.0 (g/dL)    HCT 40.9  81.1 - 91.4 (%)    MCV 95.3  78.0 - 100.0 (fL)    MCH 32.4  26.0 - 34.0 (pg)    MCHC 34.0  30.0 - 36.0 (g/dL)    RDW 78.2  95.6 - 21.3 (%)    Platelets 223  150 - 400 (K/uL)   DIFFERENTIAL     Status: Abnormal   Collection Time   10/05/11 10:11 PM      Component Value Range Comment   Neutrophils Relative 89 (*) 43 - 77 (%)    Neutro Abs 17.0 (*) 1.7 - 7.7 (K/uL)    Lymphocytes Relative 6 (*) 12 - 46 (%)    Lymphs Abs 1.2  0.7 - 4.0  (K/uL)    Monocytes Relative 5  3 - 12 (%)    Monocytes Absolute 1.0  0.1 - 1.0 (K/uL)    Eosinophils Relative 0  0 - 5 (%)    Eosinophils Absolute 0.0  0.0 - 0.7 (K/uL)    Basophils Relative 0  0 - 1 (%)    Basophils Absolute 0.0  0.0 - 0.1 (K/uL)   HEPARIN LEVEL (UNFRACTIONATED)     Status: Normal   Collection Time   10/05/11 10:12 PM      Component Value Range Comment   Heparin Unfractionated 0.46  0.30 - 0.70 (IU/mL)   CBC     Status: Abnormal   Collection Time   10/06/11  3:40 AM      Component Value Range Comment   WBC 14.8 (*) 4.0 - 10.5 (K/uL)    RBC 3.68 (*) 3.87 - 5.11 (MIL/uL)    Hemoglobin 11.8 (*) 12.0 - 15.0 (g/dL)    HCT 08.6 (*) 57.8 - 46.0 (%)    MCV 95.1  78.0 - 100.0 (fL)    MCH 32.1  26.0 - 34.0 (pg)    MCHC 33.7  30.0 - 36.0 (g/dL)    RDW 46.9  62.9 - 52.8 (%)    Platelets 211  150 - 400 (K/uL)   DIFFERENTIAL     Status: Abnormal   Collection Time   10/06/11  3:40 AM      Component Value Range Comment   Neutrophils Relative 89 (*) 43 - 77 (%)    Neutro Abs 13.1 (*) 1.7 - 7.7 (K/uL)    Lymphocytes Relative 7 (*) 12 - 46 (%)    Lymphs Abs 1.0  0.7 - 4.0 (K/uL)    Monocytes Relative 4  3 - 12 (%)    Monocytes Absolute 0.6  0.1 - 1.0 (K/uL)    Eosinophils Relative 0  0 - 5 (%)    Eosinophils Absolute 0.0  0.0 - 0.7 (K/uL)    Basophils Relative 0  0 - 1 (%)    Basophils Absolute 0.0  0.0 - 0.1 (K/uL)   BASIC METABOLIC PANEL     Status: Abnormal  Collection Time   10/06/11  3:40 AM      Component Value Range Comment   Sodium 140  135 - 145 (mEq/L)    Potassium 3.1 (*) 3.5 - 5.1 (mEq/L)    Chloride 105  96 - 112 (mEq/L)    CO2 25  19 - 32 (mEq/L)    Glucose, Bld 151 (*) 70 - 99 (mg/dL)    BUN 9  6 - 23 (mg/dL)    Creatinine, Ser 4.09  0.50 - 1.10 (mg/dL)    Calcium 8.0 (*) 8.4 - 10.5 (mg/dL)    GFR calc non Af Amer >90  >90 (mL/min)    GFR calc Af Amer >90  >90 (mL/min)   HEPARIN LEVEL (UNFRACTIONATED)     Status: Abnormal   Collection Time    10/06/11  3:40 AM      Component Value Range Comment   Heparin Unfractionated <0.10 (*) 0.30 - 0.70 (IU/mL)   URINALYSIS, ROUTINE W REFLEX MICROSCOPIC     Status: Abnormal   Collection Time   10/06/11  9:25 AM      Component Value Range Comment   Color, Urine YELLOW  YELLOW     APPearance CLEAR  CLEAR     Specific Gravity, Urine 1.043 (*) 1.005 - 1.030     pH 6.0  5.0 - 8.0     Glucose, UA NEGATIVE  NEGATIVE (mg/dL)    Hgb urine dipstick SMALL (*) NEGATIVE     Bilirubin Urine NEGATIVE  NEGATIVE     Ketones, ur NEGATIVE  NEGATIVE (mg/dL)    Protein, ur NEGATIVE  NEGATIVE (mg/dL)    Urobilinogen, UA 0.2  0.0 - 1.0 (mg/dL)    Nitrite NEGATIVE  NEGATIVE     Leukocytes, UA NEGATIVE  NEGATIVE    URINE MICROSCOPIC-ADD ON     Status: Abnormal   Collection Time   10/06/11  9:25 AM      Component Value Range Comment   Squamous Epithelial / LPF RARE  RARE     WBC, UA 0-2  <3 (WBC/hpf)    RBC / HPF 3-6  <3 (RBC/hpf)    Bacteria, UA RARE  RARE     Casts GRANULAR CAST (*) NEGATIVE    MRSA PCR SCREENING     Status: Normal   Collection Time   10/06/11  1:54 PM      Component Value Range Comment   MRSA by PCR NEGATIVE  NEGATIVE    PREPARE PLATELET PHERESIS     Status: Normal (Preliminary result)   Collection Time   10/06/11  5:00 PM      Component Value Range Comment   Unit Number [id number]      Blood Component Type PLTPHER LR3      Unit division 00      Status of Unit ISSUED      Transfusion Status OK TO TRANSFUSE      Unit Number [id number]      Blood Component Type PLTPHER LR2      Unit division 00      Status of Unit ISSUED      Transfusion Status OK TO TRANSFUSE       Mr Angiogram Head Wo Contrast  10/05/2011  *RADIOLOGY REPORT*  Clinical Data:  Status post coiling of basilar tip aneurysm.  The patient is slow to wake from anesthesia.  MRI HEAD WITHOUT CONTRAST MRA HEAD WITHOUT CONTRAST  Technique:  Multiplanar, multiecho pulse sequences of the brain and surrounding structures  were obtained without intravenous contrast. Angiographic images of the head were obtained using MRA technique without contrast.  Comparison:  CT angiogram of the head 09/08/2011 and cerebral arteriogram 10/05/2011.  Both studies are performed at [facility].  MRI HEAD  Findings:  The diffusion weighted images demonstrate no evidence for acute or subacute infarction.  An axial T2 sequence was performed.  It is signify degraded by patient motion.  The there does appear to be flow within the major intracranial arteries. Right sphenoid sinus disease is noted.  Minimal mucosal thickening is present in the maxillary sinuses bilaterally.  IMPRESSION:  1.  No evidence for acute or subacute infarction. 2.  Sphenoid sinus disease.  This may be secondary to the patient's intubated status.  MRA HEAD  Findings: Atherosclerotic irregularity is present through the cavernous carotid arteries bilaterally.  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  The A1 and M1 segments are normal.  The left A1 and M1 segments are normal. There is early bifurcation of the right MCA, a normal variant.  ACA and MCA branch vessels are normal.  The vertebral arteries are codominant.  The PICA origins are visualized and normal bilaterally.  There is signal loss in the distal basilar artery and proximal left posterior cerebral artery secondary to the stent placement.  The more distal posterior cerebral arteries fill normally.  Two left and one right superior cerebellar arteries are visualized.  No residual aneurysm is evident.  IMPRESSION:  1.  Signal loss in the distal basilar and proximal left posterior cerebral artery is artifactual secondary to stent placement. 2.  No definite residual aneurysm. 3.  More normal signal in the distal posterior cerebral arteries suggests flow through those areas. 4.  Normal appearance of the superior cerebellar arteries bilaterally. 5.  3 mm right posterior communicating artery  aneurysm is stable.  Original Report Authenticated By: Jamesetta Orleans. MATTERN, M.D.   Mr Brain Wo Contrast  10/06/2011  *RADIOLOGY REPORT*  Clinical Data: Altered mental status.  Prior aneurysm coiling.  MRI HEAD WITHOUT CONTRAST  Technique:  Multiplanar, multiecho pulse sequences of the brain and surrounding structures were obtained according to standard protocol without intravenous contrast.  Comparison: Preoperative CT study.  MRI 10/05/2011.  Findings: The patient was moderately uncooperative and could not remain motionless for the study.  Images are suboptimal.  Small or subtle lesions could be overlooked.  Compared with prior cross-sectional studies, there has been development of acute hydrocephalus.  Fluid of diminished T2 signal intensity layers within the lateral ventricles suggesting there is acute intraventricular hemorrhage.  There is mildly increased FLAIR signal in the perimesencephalic cisterns, greater on the right and possibly also in the prepontine cisterns suggesting subarachnoid hemorrhage. Flow voids at the base the brain are preserved. Basilar tip aneurysm coil mass noted at the tip of the basilar.  Small foci of restricted diffusion can be seen in the superior vermis on the left, right occipital pole, and possibly left posterior temporal lobe suggesting acute ischemia.  Mild restricted diffusion also suspected in the perimesencephalic and superior vermian cisterns could be associated with subarachnoid bleeding.  IMPRESSION: Considerable motion degradation.  Suboptimal examination.  Concern raised for interval development of hydrocephalus and subarachnoid/interventricular hemorrhage following decline in mental status.  Consider follow up with CT scanning or formal catheter angiogram.  Foci of parenchymal restricted diffusion could reflect acute stroke.  Correlate clinically.  Original Report Authenticated By: Elsie Stain, M.D.   Mr [employer] W/o  Cm  10/05/2011  *RADIOLOGY REPORT*   Clinical Data:  Status post coiling of basilar tip aneurysm.  The patient is slow to wake from anesthesia.  MRI HEAD WITHOUT CONTRAST MRA HEAD WITHOUT CONTRAST  Technique:  Multiplanar, multiecho pulse sequences of the brain and surrounding structures were obtained without intravenous contrast. Angiographic images of the head were obtained using MRA technique without contrast.  Comparison:  CT angiogram of the head 09/08/2011 and cerebral arteriogram 10/05/2011.  Both studies are performed at [facility].  MRI HEAD  Findings:  The diffusion weighted images demonstrate no evidence for acute or subacute infarction.  An axial T2 sequence was performed.  It is signify degraded by patient motion.  The there does appear to be flow within the major intracranial arteries. Right sphenoid sinus disease is noted.  Minimal mucosal thickening is present in the maxillary sinuses bilaterally.  IMPRESSION:  1.  No evidence for acute or subacute infarction. 2.  Sphenoid sinus disease.  This may be secondary to the patient's intubated status.  MRA HEAD  Findings: Atherosclerotic irregularity is present through the cavernous carotid arteries bilaterally.  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  The A1 and M1 segments are normal.  The left A1 and M1 segments are normal. There is early bifurcation of the right MCA, a normal variant.  ACA and MCA branch vessels are normal.  The vertebral arteries are codominant.  The PICA origins are visualized and normal bilaterally.  There is signal loss in the distal basilar artery and proximal left posterior cerebral artery secondary to the stent placement.  The more distal posterior cerebral arteries fill normally.  Two left and one right superior cerebellar arteries are visualized.  No residual aneurysm is evident.  IMPRESSION:  1.  Signal loss in the distal basilar and proximal left posterior cerebral artery is artifactual secondary to stent placement. 2.  No  definite residual aneurysm. 3.  More normal signal in the distal posterior cerebral arteries suggests flow through those areas. 4.  Normal appearance of the superior cerebellar arteries bilaterally. 5.  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  Original Report Authenticated By: Jamesetta Orleans. MATTERN, M.D.    Review of Systems  Unable to perform ROS: mental status change   Blood pressure 138/55, pulse 66, temperature 99.3 F (37.4 C), temperature source Oral, resp. rate 23, height 5\' 7"  (1.702 m), weight 73.3 kg (161 lb 9.6 oz), SpO2 95.00%. Physical Exam  Constitutional: She appears well-developed and well-nourished. She appears lethargic.  HENT:  Head: Normocephalic and atraumatic.  Eyes: Pupils are equal, round, and reactive to light.  Neck: Normal range of motion.  Respiratory: Effort normal.  Neurological: She has normal strength. She appears lethargic.    Assessment/Plan: Mrs. Laino has acute hydrocepalus status post coiling. I believe she needs to have a ventricular catheter placed on an emergent basis. She has received 2 doses of plavix, and has also received aspirin. Despite this her declining mental status makes the catheter imperative. She did receive a platelet transfusion to augment her platelet function. I have explained to her family that the ventricular catheter should be performed, even with the increased risk of bleeding. They understand and wish to proceed with the catheter placement. Other risks including infection, brain damage, paralysis, weakness, and seizures were discussed. I will proceed immediately. I did discuss her care, and case with Dr. Corliss Skains and he concurs with this plan of action.   Leny Morozov L  10/06/2011, 5:26 PM

## 2011-10-06 NOTE — Progress Notes (Addendum)
Patient transported to Neuro OR for ventriculostomy drain, multiple family members present when Dr. Mikal Plane discussed need for same, all family members agreeable to procedure, has received rapid infusion of 2 units platelets, does appear to have some itching, anesthesia and Dr. Mikal Plane aware, continues to have rt femoral arterial and rt radial arterial lines, has continued to arouse but go right back to sleep, unable to state orientation"I don't know", has area to left lower lip that is dark in color, present on my first assessment at 0730, presumed bruised for intubation yesterday with procedure, also has bleed to right scleral again present at 0730, has developed faint discoloration at sternal area presumed bruising from sternal rub gently this am by nursing staff, MD, and family, had emergent MRI this afternoon, Berle Mull RN

## 2011-10-06 NOTE — Telephone Encounter (Signed)
Called husband, explained dr Ian Bushman doesn't do disability work and to contact pcp or soc sec office and a dr who does nothing but that kind of work will do papers. Husband voiced understanding.

## 2011-10-06 NOTE — Transfer of Care (Signed)
Immediate Anesthesia Transfer of Care Note  Patient: Jill [name]  Procedure(s) Performed:  VENTRICULOSTOMY - Insertion of Ventriculostomy Catheter  Patient Location: PACU  Anesthesia Type: MAC  Level of Consciousness: awake, pateint uncooperative and confused  Airway & Oxygen Therapy: Patient Spontanous Breathing and Patient connected to nasal cannula oxygen  Post-op Assessment: Report given to PACU RN, Post -op Vital signs reviewed and stable and Patient moving all extremities X 4  Post vital signs: Reviewed and stable  Complications: No apparent anesthesia complications

## 2011-10-06 NOTE — Progress Notes (Signed)
Did not give Toradol as ordered b/c pt stated the pain was not as bad and she wanted to wait until she could receive Tylenol at 0040.  Medication was returned to the [facility].

## 2011-10-06 NOTE — Anesthesia Preprocedure Evaluation (Addendum)
Anesthesia Evaluation  Patient identified by MRN, date of birth, ID band Patient confused    Reviewed: Allergy & Precautions, H&P , NPO status , Patient's Chart, lab work & pertinent test results, Unable to perform ROS - Chart review only  Airway Mallampati: I      Dental  (+) Poor Dentition   Pulmonary shortness of breath and with exertion, Recent URI , Residual Cough, Current Smoker,    Pulmonary exam normal       Cardiovascular hypertension, Pt. on medications + CAD, + Past MI and + CABG  CABG '08   EF 70%  7/12   Neuro/Psych  Headaches, PSYCHIATRIC DISORDERS Anxiety Cerebral aneurysm s/p coiling 11/30    GI/Hepatic GERD-  Medicated,  Endo/Other  Hypothyroidism   Renal/GU      Musculoskeletal   Abdominal   Peds  Hematology On plavix receiving platelets   Anesthesia Other Findings   Reproductive/Obstetrics                          Anesthesia Physical Anesthesia Plan  ASA: IV and Emergent  Anesthesia Plan: MAC   Post-op Pain Management:    Induction: Intravenous  Airway Management Planned: Simple Face Mask  Additional Equipment: Arterial line  Intra-op Plan:   Post-operative Plan:   Informed Consent: I have reviewed the patients History and Physical, chart, labs and discussed the procedure including the risks, benefits and alternatives for the proposed anesthesia with the patient or authorized representative who has indicated his/her understanding and acceptance.   History available from chart only  Plan Discussed with: Surgeon and CRNA  Anesthesia Plan Comments:        Anesthesia Quick Evaluation

## 2011-10-06 NOTE — Progress Notes (Signed)
Subjective: Remains confused,disoriented to time,place and space.No nausea.H/As persist. Repeat MRI brain shows presence of intraventricular blood with mod hydrocephalus.? Blood in SAspace over the vermis..   Objective: Vital signs in last 24 hours: Temp:  [99.2 F (37.3 C)-99.3 F (37.4 C)] 99.3 F (37.4 C) (11/30 1200) Pulse Rate:  [49-75] 66  (11/30 1400) Resp:  [18-25] 23  (11/30 1400) BP: (104-138)/(41-60) 138/55 mmHg (11/30 1400) SpO2:  [90 %-98 %] 95 % (11/30 1400) Arterial Line BP: (100-171)/(39-55) 171/54 mmHg (11/30 1400) Weight:  [161 lb 9.6 oz (73.3 kg)] 161 lb 9.6 oz (73.3 kg) (11/29 2000)    Intake/Output from previous day: 11/29 0701 - 11/30 0700 In: 2311.2 [P.O.:30; I.V.:2081.2; IV Piggyback:200] Out: 2575 [Urine:2575] Intake/Output this shift: Total I/O In: 102.5 [I.V.:102.5] Out: 200 [Urine:200]    Lab Results:   [facility] 10/06/11 0340 10/05/11 2211  WBC 14.8* 19.2*  HGB 11.8* 12.5  HCT 35.0* 36.8  PLT 211 223   BMET  Basename 10/06/11 0340  NA 140  K 3.1*  CL 105  CO2 25  GLUCOSE 151*  BUN 9  CREATININE 0.72  CALCIUM 8.0*   PT/INR No results found for this basename: LABPROT:2,INR:2 in the last 72 hours ABG No results found for this basename: PHART:2,PCO2:2,PO2:2,HCO3:2 in the last 72 hours  Studies/Results: Mr Angiogram Head Wo Contrast  10/05/2011  *RADIOLOGY REPORT*  Clinical Data:  Status post coiling of basilar tip aneurysm.  The patient is slow to wake from anesthesia.  MRI HEAD WITHOUT CONTRAST MRA HEAD WITHOUT CONTRAST  Technique:  Multiplanar, multiecho pulse sequences of the brain and surrounding structures were obtained without intravenous contrast. Angiographic images of the head were obtained using MRA technique without contrast.  Comparison:  CT angiogram of the head 09/08/2011 and cerebral arteriogram 10/05/2011.  Both studies are performed at [facility].  MRI HEAD  Findings:  The diffusion weighted images  demonstrate no evidence for acute or subacute infarction.  An axial T2 sequence was performed.  It is signify degraded by patient motion.  The there does appear to be flow within the major intracranial arteries. Right sphenoid sinus disease is noted.  Minimal mucosal thickening is present in the maxillary sinuses bilaterally.  IMPRESSION:  1.  No evidence for acute or subacute infarction. 2.  Sphenoid sinus disease.  This may be secondary to the patient's intubated status.  MRA HEAD  Findings: Atherosclerotic irregularity is present through the cavernous carotid arteries bilaterally.  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  The A1 and M1 segments are normal.  The left A1 and M1 segments are normal. There is early bifurcation of the right MCA, a normal variant.  ACA and MCA branch vessels are normal.  The vertebral arteries are codominant.  The PICA origins are visualized and normal bilaterally.  There is signal loss in the distal basilar artery and proximal left posterior cerebral artery secondary to the stent placement.  The more distal posterior cerebral arteries fill normally.  Two left and one right superior cerebellar arteries are visualized.  No residual aneurysm is evident.  IMPRESSION:  1.  Signal loss in the distal basilar and proximal left posterior cerebral artery is artifactual secondary to stent placement. 2.  No definite residual aneurysm. 3.  More normal signal in the distal posterior cerebral arteries suggests flow through those areas. 4.  Normal appearance of the superior cerebellar arteries bilaterally. 5.  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  Original  Report Authenticated By: Jamesetta Orleans. MATTERN, M.D.   Mr Brain Ltd W/o Cm  10/05/2011  *RADIOLOGY REPORT*  Clinical Data:  Status post coiling of basilar tip aneurysm.  The patient is slow to wake from anesthesia.  MRI HEAD WITHOUT CONTRAST MRA HEAD WITHOUT CONTRAST  Technique:   Multiplanar, multiecho pulse sequences of the brain and surrounding structures were obtained without intravenous contrast. Angiographic images of the head were obtained using MRA technique without contrast.  Comparison:  CT angiogram of the head 09/08/2011 and cerebral arteriogram 10/05/2011.  Both studies are performed at [facility].  MRI HEAD  Findings:  The diffusion weighted images demonstrate no evidence for acute or subacute infarction.  An axial T2 sequence was performed.  It is signify degraded by patient motion.  The there does appear to be flow within the major intracranial arteries. Right sphenoid sinus disease is noted.  Minimal mucosal thickening is present in the maxillary sinuses bilaterally.  IMPRESSION:  1.  No evidence for acute or subacute infarction. 2.  Sphenoid sinus disease.  This may be secondary to the patient's intubated status.  MRA HEAD  Findings: Atherosclerotic irregularity is present through the cavernous carotid arteries bilaterally.  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  The A1 and M1 segments are normal.  The left A1 and M1 segments are normal. There is early bifurcation of the right MCA, a normal variant.  ACA and MCA branch vessels are normal.  The vertebral arteries are codominant.  The PICA origins are visualized and normal bilaterally.  There is signal loss in the distal basilar artery and proximal left posterior cerebral artery secondary to the stent placement.  The more distal posterior cerebral arteries fill normally.  Two left and one right superior cerebellar arteries are visualized.  No residual aneurysm is evident.  IMPRESSION:  1.  Signal loss in the distal basilar and proximal left posterior cerebral artery is artifactual secondary to stent placement. 2.  No definite residual aneurysm. 3.  More normal signal in the distal posterior cerebral arteries suggests flow through those areas. 4.  Normal appearance of the superior  cerebellar arteries bilaterally. 5.  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  Original Report Authenticated By: Jamesetta Orleans. MATTERN, M.D.    Anti-infectives: Anti-infectives     Start     Dose/Rate Route Frequency Ordered Stop   10/05/11 1600   vancomycin (VANCOCIN) IVPB 1000 mg/200 mL premix        1,000 mg 200 mL/hr over 60 Minutes Intravenous  Once 10/05/11 1414 10/05/11 1725   10/05/11 0945   vancomycin (VANCOCIN) IVPB 1000 mg/200 mL premix        1,000 mg 200 mL/hr over 60 Minutes Intravenous  Once 10/05/11 0930 10/05/11 1017          Assessment/Plan: s/p  Mental status changes . Intravent hemorrhage with hydrocephalus on MRI.Brain. Plan. DCiv heparin,antiplatelets.stat. Transfuse x2 units of platelets. Now. Discussed with daughter and son. Dr Franky Macho Neurosurgery to see with view to EVD  placement   LOS: 1 day    Jeiden Daughtridge K 10/06/2011

## 2011-10-06 NOTE — Op Note (Signed)
10/05/2011 - 10/06/2011  7:50 PM  PATIENT:  Jill [name]  60 y.o. female  PRE-OPERATIVE DIAGNOSIS:  Acute Obstructive Hydrocephalus  POST-OPERATIVE DIAGNOSIS:  Acute Obstructive Hydrocephalus  PROCEDURE:  Procedure(s): Right frontal intraventricular catheter placement     VENTRICULOSTOMY  SURGEON:  Surgeon(s): Carmela Hurt  ASSISTANTS:none  ANESTHESIA:   local and IV sedation  EBL:  Total I/O In: 500 [I.V.:500] Out: -   BLOOD ADMINISTERED:2 units PLTS  CELL SAVER GIVEN:none  COUNT:correct  DRAINS: Ventriculostomy Drain in the r frontal horn of the lateral ventricle   SPECIMEN:  No Specimen  DICTATION: Jill [name] was taken to the operating room and placed on the operating table with her head positioned on a gel doughnut. All pressure points were properly padded. She was given propofol for sedation. I shaved and prepped the right frontal area of her head. I infiltrated 10 cc lidocaine into my marked incision line. Ensuring adequate anesthesia I then opened the scalp with a no. 10 blade. I took my incision down to the skull. I placed a self-retaining retractor, and removed the periosteum with monopolar cautery. I used a power drill to create a burr hole in the right frontal bone. I opened the dura with the monopolar cautery. I placed the ventricular catheter in one pass to 6cm of depth into the ventricular. I had brisk return of cerebrospinal fluid. I tunneled the catheter posteriorly approximately 5cm and brought it out of the scalp with a stab incision from the tunneling tool. I checked my csf flow and it remained brisk. I secured the catheter to a luer lock with a silk tie. I secured the catheter at the exit site with a 3-0 nylon suture. I close the scalp in a single layer with 3-0 nylon interrupted sutures. I applied a sterile dressing. Jill [name] was returned to the neuro ICU in stable condition  PLAN OF CARE: Drain catheter at 10 cm H2O  PATIENT DISPOSITION:  ICU -  extubated and stable.   Delay start of Pharmacological VTE agent (>24hrs) due to surgical blood loss or risk of bleeding:  yes

## 2011-10-06 NOTE — Anesthesia Postprocedure Evaluation (Signed)
  Anesthesia Post-op Note  Patient: Jill [name]  Procedure(s) Performed:  VENTRICULOSTOMY - Insertion of Ventriculostomy Catheter  Patient Location: PACU and NICU  Anesthesia Type: MAC  Level of Consciousness: confused  Airway and Oxygen Therapy: Patient Spontanous Breathing  Post-op Pain: none  Post-op Assessment: Post-op Vital signs reviewed, Patient's Cardiovascular Status Stable, Respiratory Function Stable, Patent Airway, No signs of Nausea or vomiting and Pain level controlled  Post-op Vital Signs: stable  Complications: No apparent anesthesia complications

## 2011-10-07 ENCOUNTER — Inpatient Hospital Stay (HOSPITAL_COMMUNITY): Payer: Medicaid Other

## 2011-10-07 LAB — BASIC METABOLIC PANEL
CO2: 26 mEq/L (ref 19–32)
Calcium: 8.8 mg/dL (ref 8.4–10.5)
Chloride: 109 mEq/L (ref 96–112)
Glucose, Bld: 142 mg/dL — ABNORMAL HIGH (ref 70–99)
Potassium: 3.1 mEq/L — ABNORMAL LOW (ref 3.5–5.1)
Sodium: 144 mEq/L (ref 135–145)

## 2011-10-07 LAB — PREPARE PLATELET PHERESIS

## 2011-10-07 LAB — GLUCOSE, CAPILLARY
Glucose-Capillary: 126 mg/dL — ABNORMAL HIGH (ref 70–99)
Glucose-Capillary: 142 mg/dL — ABNORMAL HIGH (ref 70–99)
Glucose-Capillary: 115 mg/dL — ABNORMAL HIGH (ref 70–99)

## 2011-10-07 MED ORDER — DEXAMETHASONE 2 MG PO TABS
2.0000 mg | ORAL_TABLET | Freq: Three times a day (TID) | ORAL | Status: DC
  Administered 2011-10-07 – 2011-10-13 (×19): 2 mg via ORAL
  Filled 2011-10-07 (×22): qty 1

## 2011-10-07 MED ORDER — POTASSIUM CHLORIDE CRYS ER 20 MEQ PO TBCR
40.0000 meq | EXTENDED_RELEASE_TABLET | Freq: Once | ORAL | Status: AC
  Administered 2011-10-07: 40 meq via ORAL
  Filled 2011-10-07: qty 2

## 2011-10-07 MED ORDER — CLOPIDOGREL BISULFATE 75 MG PO TABS
75.0000 mg | ORAL_TABLET | Freq: Every day | ORAL | Status: DC
  Administered 2011-10-07: 75 mg via ORAL
  Filled 2011-10-07 (×3): qty 1

## 2011-10-07 MED ORDER — DEXAMETHASONE SODIUM PHOSPHATE 4 MG/ML IJ SOLN: 2.0000 mg | Freq: Three times a day (TID) | INTRAMUSCULAR | Status: DC

## 2011-10-07 NOTE — Progress Notes (Signed)
Stroke Team Progress Note  SUBJECTIVE [name] is a 60 y.o. female who was admitted for for endovasuclar coiling of an unruptured basilar tip Aneurysm on 10/05/2011. She received a stent in the basilar artery necessitating plavix post procedure. She woke up very slowly  after her procedure and underwent an MRI and MRA. No infarcts were identified. She did improve with time and was following commands, moving all extremities prior to going to sleep . She woke next morning once more lethargic. She had another MRI yesterday which revealed acute ventricular enlargement, along with intraventricular blood in both occipital horns Intracerebral vasculature remained patent.She underwent emergent ventriculostomy by Dr. Mikal Plane yesterday with improvement in her mental status and vertical eye movements.   OBJECTIVE Most recent Vital Signs: Temp: 99 F (37.2 C) (12/01 0800) Temp src: Oral (12/01 0800) BP: 138/66 mmHg (12/01 0900) Pulse Rate: 67  (12/01 0900) Respiratory Rate: 22 O2 Saturdation: 91%  CBG (last 3)   Basename 10/07/11 0810 10/06/11 2159 10/06/11 1758  GLUCAP 126* 144* 140*   Intake/Output from previous day: 11/30 0701 - 12/01 0700 In: 2079.7 [I.V.:1504.7; Blood:375; IV Piggyback:200] Out: 1040 [Urine:925; Drains:115]  IV Fluid Intake:     . sodium chloride 100 mL/hr at 10/07/11 0900  . DISCONTD: sodium chloride 10 mL/hr at 10/06/11 0600  . DISCONTD: heparin 5.5 mL/hr (10/06/11 0600)  . DISCONTD: heparin 5.5 mL/hr (10/06/11 1400)  . DISCONTD: niCARDipine Stopped (10/05/11 2300)   Diet: Clear Liquid    Activity: Up with assistance  DVT Prophylaxis:  SCDs  Studies: Results for orders placed during the hospital encounter of 10/05/11 (from the past 24 hour(s))  GLUCOSE, CAPILLARY     Status: Abnormal   Collection Time   10/06/11 12:54 PM      Component Value Range   Glucose-Capillary 137 (*) 70 - 99 (mg/dL)  MRSA PCR SCREENING     Status: Normal   Collection Time   10/06/11  1:54 PM      Component Value Range   MRSA by PCR NEGATIVE  NEGATIVE   PREPARE PLATELET PHERESIS     Status: Normal (Preliminary result)   Collection Time   10/06/11  5:00 PM      Component Value Range   Unit Number [id number]     Blood Component Type PLTPHER LR3     Unit division 00     Status of Unit ISSUED     Transfusion Status OK TO TRANSFUSE     Unit Number [id number]     Blood Component Type PLTPHER LR2     Unit division 00     Status of Unit ISSUED     Transfusion Status OK TO TRANSFUSE    GLUCOSE, CAPILLARY     Status: Abnormal   Collection Time   10/06/11  5:58 PM      Component Value Range   Glucose-Capillary 140 (*) 70 - 99 (mg/dL)  GLUCOSE, CAPILLARY     Status: Abnormal   Collection Time   10/06/11  9:59 PM      Component Value Range   Glucose-Capillary 144 (*) 70 - 99 (mg/dL)   Comment 1 Notify RN     Comment 2 Documented in Chart    GLUCOSE, CAPILLARY     Status: Abnormal   Collection Time   10/07/11  8:10 AM      Component Value Range   Glucose-Capillary 126 (*) 70 - 99 (mg/dL)     Mr Angiogram Head Wo Contrast  10/05/2011  *RADIOLOGY REPORT*  Clinical Data:  Status post coiling of basilar tip aneurysm.  The patient is slow to wake from anesthesia.  MRI HEAD WITHOUT CONTRAST MRA HEAD WITHOUT CONTRAST  Technique:  Multiplanar, multiecho pulse sequences of the brain and surrounding structures were obtained without intravenous contrast. Angiographic images of the head were obtained using MRA technique without contrast.  Comparison:  CT angiogram of the head 09/08/2011 and cerebral arteriogram 10/05/2011.  Both studies are performed at [facility]  MRI HEAD  Findings:  The diffusion weighted images demonstrate no evidence for acute or subacute infarction.  An axial T2 sequence was performed.  It is signify degraded by patient motion.  The there does appear to be flow within the major intracranial arteries. Right sphenoid sinus disease is noted.   Minimal mucosal thickening is present in the maxillary sinuses bilaterally.  IMPRESSION:  1.  No evidence for acute or subacute infarction. 2.  Sphenoid sinus disease.  This may be secondary to the patient's intubated status.  MRA HEAD  Findings: Atherosclerotic irregularity is present through the cavernous carotid arteries bilaterally.  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  The A1 and M1 segments are normal.  The left A1 and M1 segments are normal. There is early bifurcation of the right MCA, a normal variant.  ACA and MCA branch vessels are normal.  The vertebral arteries are codominant.  The PICA origins are visualized and normal bilaterally.  There is signal loss in the distal basilar artery and proximal left posterior cerebral artery secondary to the stent placement.  The more distal posterior cerebral arteries fill normally.  Two left and one right superior cerebellar arteries are visualized.  No residual aneurysm is evident.  IMPRESSION:  1.  Signal loss in the distal basilar and proximal left posterior cerebral artery is artifactual secondary to stent placement. 2.  No definite residual aneurysm. 3.  More normal signal in the distal posterior cerebral arteries suggests flow through those areas. 4.  Normal appearance of the superior cerebellar arteries bilaterally. 5.  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  Original Report Authenticated By: Jamesetta Orleans. MATTERN, M.D.   Mr Brain Wo Contrast  10/06/2011  *RADIOLOGY REPORT*  Clinical Data: Altered mental status.  Prior aneurysm coiling.  MRI HEAD WITHOUT CONTRAST  Technique:  Multiplanar, multiecho pulse sequences of the brain and surrounding structures were obtained according to standard protocol without intravenous contrast.  Comparison: Preoperative CT study.  MRI 10/05/2011.  Findings: The patient was moderately uncooperative and could not remain motionless for the study.  Images are suboptimal.   Small or subtle lesions could be overlooked.  Compared with prior cross-sectional studies, there has been development of acute hydrocephalus.  Fluid of diminished T2 signal intensity layers within the lateral ventricles suggesting there is acute intraventricular hemorrhage.  There is mildly increased FLAIR signal in the perimesencephalic cisterns, greater on the right and possibly also in the prepontine cisterns suggesting subarachnoid hemorrhage. Flow voids at the base the brain are preserved. Basilar tip aneurysm coil mass noted at the tip of the basilar.  Small foci of restricted diffusion can be seen in the superior vermis on the left, right occipital pole, and possibly left posterior temporal lobe suggesting acute ischemia.  Mild restricted diffusion also suspected in the perimesencephalic and superior vermian cisterns could be associated with subarachnoid bleeding.  IMPRESSION: Considerable motion degradation.  Suboptimal examination.  Concern raised for interval development of hydrocephalus  and subarachnoid/interventricular hemorrhage following decline in mental status.  Consider follow up with CT scanning or formal catheter angiogram.  Foci of parenchymal restricted diffusion could reflect acute stroke.  Correlate clinically.  Original Report Authenticated By: Elsie Stain, M.D.   Mr Brain Ltd W/o Cm  10/05/2011  *RADIOLOGY REPORT*  Clinical Data:  Status post coiling of basilar tip aneurysm.  The patient is slow to wake from anesthesia.  MRI HEAD WITHOUT CONTRAST MRA HEAD WITHOUT CONTRAST  Technique:  Multiplanar, multiecho pulse sequences of the brain and surrounding structures were obtained without intravenous contrast. Angiographic images of the head were obtained using MRA technique without contrast.  Comparison:  CT angiogram of the head 09/08/2011 and cerebral arteriogram 10/05/2011.  Both studies are performed at [facility].  MRI HEAD  Findings:  The diffusion weighted images  demonstrate no evidence for acute or subacute infarction.  An axial T2 sequence was performed.  It is signify degraded by patient motion.  The there does appear to be flow within the major intracranial arteries. Right sphenoid sinus disease is noted.  Minimal mucosal thickening is present in the maxillary sinuses bilaterally.  IMPRESSION:  1.  No evidence for acute or subacute infarction. 2.  Sphenoid sinus disease.  This may be secondary to the patient's intubated status.  MRA HEAD  Findings: Atherosclerotic irregularity is present through the cavernous carotid arteries bilaterally.  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  The A1 and M1 segments are normal.  The left A1 and M1 segments are normal. There is early bifurcation of the right MCA, a normal variant.  ACA and MCA branch vessels are normal.  The vertebral arteries are codominant.  The PICA origins are visualized and normal bilaterally.  There is signal loss in the distal basilar artery and proximal left posterior cerebral artery secondary to the stent placement.  The more distal posterior cerebral arteries fill normally.  Two left and one right superior cerebellar arteries are visualized.  No residual aneurysm is evident.  IMPRESSION:  1.  Signal loss in the distal basilar and proximal left posterior cerebral artery is artifactual secondary to stent placement. 2.  No definite residual aneurysm. 3.  More normal signal in the distal posterior cerebral arteries suggests flow through those areas. 4.  Normal appearance of the superior cerebellar arteries bilaterally. 5.  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  Original Report Authenticated By: Jamesetta Orleans. MATTERN, M.D.    Physical Exam:   Pleasant middle-aged Caucasian lady currently not in distress. Afebrile. Head is not dramatic except for when the colostomy catheter. Neck is supple without bruit. Cardiac exam no murmur or gallop. Lungs clear to  auscultation.  Neurological exam awake alert oriented to place and person. Diminished attention and recall. Speech and language appear normal. Eye movements are full range without nystagmus. Face is symmetric without weakness. Tongue is midline. Motor system exam reveals symmetric and equal strength in all 4 extremities. Deep tendonre flexes  2+ symmetric plantars are downgoing. ASSESSMENT [name] is a 60 y.o. female with a elective coiling of a symptomatic basilar tip aneurysm complicated by small intraventricular bleeding with mild hydrocephalus status post ventriculostomy with significant clinical improvement  Hospital day # 2  TREATMENT/PLAN  continue present treatment plan.Strict control of blood pressure. Followup CT imaging tomorrow. Ventriculostomy cranial and discontinue as per neurosurgery.discuss with Dr. Newell Coral.  Gates Rigg, MD [facility] Pager: [phone] 10/07/2011 9:37 AM

## 2011-10-07 NOTE — Progress Notes (Signed)
Subjective:  Patient sitting up in bed. IVC draining well. IVC is putting out between 2 and 22 cc per hour of mildly blood-tinged CSF.  Objective: Vital signs in last 24 hours: Filed Vitals:   10/07/11 0500 10/07/11 0600 10/07/11 0700 10/07/11 0800  BP: 129/68 138/56 136/67 137/61  Pulse: 56 55 61 53  Temp:    99 F (37.2 C)  TempSrc:    Oral  Resp: 21 21 22 21   Height:      Weight: 71.2 kg (156 lb 15.5 oz)     SpO2: 93% 92% 94% 94%    Intake/Output from previous day: 11/30 0701 - 12/01 0700 In: 2079.7 [I.V.:1504.7; Blood:375; IV Piggyback:200] Out: 1040 [Urine:925; Drains:115] Intake/Output this shift: Total I/O In: 140 [P.O.:40; I.V.:100] Out: 4 [Drains:4]  Physical exam: patient is awake and alert oriented following commands with fluent speech and good comprehension. Pupils are equal round and reactive to light and about 4 mm bilaterally a truck of movements are intact facial movement is symmetrical she is good strength in all 4 extremities.  CBC  Basename 10/06/11 0340 10/05/11 2211  WBC 14.8* 19.2*  HGB 11.8* 12.5  HCT 35.0* 36.8  PLT 211 223   BMET  Basename 10/06/11 0340  NA 140  K 3.1*  CL 105  CO2 25  GLUCOSE 151*  BUN 9  CREATININE 0.72  CALCIUM 8.0*    Studies/Results: Mr Angiogram Head Wo Contrast  10/05/2011  *RADIOLOGY REPORT*  Clinical Data:  Status post coiling of basilar tip aneurysm.  The patient is slow to wake from anesthesia.  MRI HEAD WITHOUT CONTRAST MRA HEAD WITHOUT CONTRAST  Technique:  Multiplanar, multiecho pulse sequences of the brain and surrounding structures were obtained without intravenous contrast. Angiographic images of the head were obtained using MRA technique without contrast.  Comparison:  CT angiogram of the head 09/08/2011 and cerebral arteriogram 10/05/2011.  Both studies are performed at [facility].  MRI HEAD  Findings:  The diffusion weighted images demonstrate no evidence for acute or subacute infarction.  An  axial T2 sequence was performed.  It is signify degraded by patient motion.  The there does appear to be flow within the major intracranial arteries. Right sphenoid sinus disease is noted.  Minimal mucosal thickening is present in the maxillary sinuses bilaterally.  IMPRESSION:  1.  No evidence for acute or subacute infarction. 2.  Sphenoid sinus disease.  This may be secondary to the patient's intubated status.  MRA HEAD  Findings: Atherosclerotic irregularity is present through the cavernous carotid arteries bilaterally.  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  The A1 and M1 segments are normal.  The left A1 and M1 segments are normal. There is early bifurcation of the right MCA, a normal variant.  ACA and MCA branch vessels are normal.  The vertebral arteries are codominant.  The PICA origins are visualized and normal bilaterally.  There is signal loss in the distal basilar artery and proximal left posterior cerebral artery secondary to the stent placement.  The more distal posterior cerebral arteries fill normally.  Two left and one right superior cerebellar arteries are visualized.  No residual aneurysm is evident.  IMPRESSION:  1.  Signal loss in the distal basilar and proximal left posterior cerebral artery is artifactual secondary to stent placement. 2.  No definite residual aneurysm. 3.  More normal signal in the distal posterior cerebral arteries suggests flow through those areas. 4.  Normal appearance of the  superior cerebellar arteries bilaterally. 5.  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  Original Report Authenticated By: Jamesetta Orleans. MATTERN, M.D.   Mr Brain Wo Contrast  10/06/2011  *RADIOLOGY REPORT*  Clinical Data: Altered mental status.  Prior aneurysm coiling.  MRI HEAD WITHOUT CONTRAST  Technique:  Multiplanar, multiecho pulse sequences of the brain and surrounding structures were obtained according to standard protocol without  intravenous contrast.  Comparison: Preoperative CT study.  MRI 10/05/2011.  Findings: The patient was moderately uncooperative and could not remain motionless for the study.  Images are suboptimal.  Small or subtle lesions could be overlooked.  Compared with prior cross-sectional studies, there has been development of acute hydrocephalus.  Fluid of diminished T2 signal intensity layers within the lateral ventricles suggesting there is acute intraventricular hemorrhage.  There is mildly increased FLAIR signal in the perimesencephalic cisterns, greater on the right and possibly also in the prepontine cisterns suggesting subarachnoid hemorrhage. Flow voids at the base the brain are preserved. Basilar tip aneurysm coil mass noted at the tip of the basilar.  Small foci of restricted diffusion can be seen in the superior vermis on the left, right occipital pole, and possibly left posterior temporal lobe suggesting acute ischemia.  Mild restricted diffusion also suspected in the perimesencephalic and superior vermian cisterns could be associated with subarachnoid bleeding.  IMPRESSION: Considerable motion degradation.  Suboptimal examination.  Concern raised for interval development of hydrocephalus and subarachnoid/interventricular hemorrhage following decline in mental status.  Consider follow up with CT scanning or formal catheter angiogram.  Foci of parenchymal restricted diffusion could reflect acute stroke.  Correlate clinically.  Original Report Authenticated By: Elsie Stain, M.D.   Mr Brain Ltd W/o Cm  10/05/2011  *RADIOLOGY REPORT*  Clinical Data:  Status post coiling of basilar tip aneurysm.  The patient is slow to wake from anesthesia.  MRI HEAD WITHOUT CONTRAST MRA HEAD WITHOUT CONTRAST  Technique:  Multiplanar, multiecho pulse sequences of the brain and surrounding structures were obtained without intravenous contrast. Angiographic images of the head were obtained using MRA technique without contrast.   Comparison:  CT angiogram of the head 09/08/2011 and cerebral arteriogram 10/05/2011.  Both studies are performed at [facility].  MRI HEAD  Findings:  The diffusion weighted images demonstrate no evidence for acute or subacute infarction.  An axial T2 sequence was performed.  It is signify degraded by patient motion.  The there does appear to be flow within the major intracranial arteries. Right sphenoid sinus disease is noted.  Minimal mucosal thickening is present in the maxillary sinuses bilaterally.  IMPRESSION:  1.  No evidence for acute or subacute infarction. 2.  Sphenoid sinus disease.  This may be secondary to the patient's intubated status.  MRA HEAD  Findings: Atherosclerotic irregularity is present through the cavernous carotid arteries bilaterally.  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  The A1 and M1 segments are normal.  The left A1 and M1 segments are normal. There is early bifurcation of the right MCA, a normal variant.  ACA and MCA branch vessels are normal.  The vertebral arteries are codominant.  The PICA origins are visualized and normal bilaterally.  There is signal loss in the distal basilar artery and proximal left posterior cerebral artery secondary to the stent placement.  The more distal posterior cerebral arteries fill normally.  Two left and one right superior cerebellar arteries are visualized.  No residual aneurysm is evident.  IMPRESSION:  1.  Signal loss in the distal basilar and proximal left posterior cerebral artery is artifactual secondary to stent placement. 2.  No definite residual aneurysm. 3.  More normal signal in the distal posterior cerebral arteries suggests flow through those areas. 4.  Normal appearance of the superior cerebellar arteries bilaterally. 5.  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  Original Report Authenticated By: Jamesetta Orleans. MATTERN, M.D.    Assessment/Plan: Patient stable status post  placement of IVC by Dr. Coletta Memos. We'll continue to have IVC drain at 10 cm of water. We'll recheck CT brain without on Monday, December 3 a.m.   Hewitt Shorts, MD 10/07/2011, 8:58 AM

## 2011-10-07 NOTE — Progress Notes (Signed)
1 Day Post-Op  Subjective: Called to see  because patient had new complaints of  Left perioral  Numbness and rt sided altered sensation at appro 12 noon,followed gradually of lt facial droop. No changes in paiients' H?As . NO other specific symptoms.  Objective: Vital signs in last 24 hours: Temp:  [97.8 F (36.6 C)-99 F (37.2 C)] 97.8 F (36.6 C) (12/01 1130) Pulse Rate:  [48-82] 67  (12/01 1230) Resp:  [19-23] 19  (12/01 1230) BP: (99-151)/(51-102) 151/70 mmHg (12/01 1230) SpO2:  [90 %-99 %] 92 % (12/01 1230) Arterial Line BP: (154-171)/(54-66) 162/66 mmHg (12/01 0900) Weight:  [156 lb 15.5 oz (71.2 kg)] 156 lb 15.5 oz (71.2 kg) (12/01 0500)    Intake/Output from previous day: 11/30 0701 - 12/01 0700 In: 2079.7 [I.V.:1504.7; Blood:375; IV Piggyback:200] Out: 1040 [Urine:925; Drains:115] Intake/Output this shift: Total I/O In: 880 [P.O.:180; I.V.:500; IV Piggyback:200] Out: 339 [Urine:250; Drains:89]  ON Exam Alert,awake ,oriented X3. Mildly dysarthric. Lt facial droop,lowerface involvement more than the upper face . ROMS full VFs intact. PERLA  3mm. Decreased subjective light touch  On the right. Motor  No drift or change in motor strength. Co ordination intact to finger to nose.  CT Brain Sig. Improvement in vent size. No change in IVent  Or perimesencechalic  bood.. No gross hypodensities seen.  Lab Results:   [facility] 10/06/11 0340 10/05/11 2211  WBC 14.8* 19.2*  HGB 11.8* 12.5  HCT 35.0* 36.8  PLT 211 223   BMET  Basename 10/07/11 1121 10/06/11 0340  NA 144 140  K 3.1* 3.1*  CL 109 105  CO2 26 25  GLUCOSE 142* 151*  BUN 16 9  CREATININE 0.94 0.72  CALCIUM 8.8 8.0*   PT/INR No results found for this basename: LABPROT:2,INR:2 in the last 72 hours ABG No results found for this basename: PHART:2,PCO2:2,PO2:2,HCO3:2 in the last 72 hours  Studies/Results: Ct Head Wo Contrast  10/07/2011  *RADIOLOGY REPORT*  Clinical Data: Sudden onset altered  mental status and facial drooping 24 hours ago.  Recent basilar tip aneurysm coiling.  CT HEAD WITHOUT CONTRAST  Technique:  Contiguous axial images were obtained from the base of the skull through the vertex without contrast.  Comparison: Most recent MR 10/06/2011  Findings: Interval placement of a right frontal ventriculostomy with improved hydrocephalus.  Intraventricular blood layers posteriorly in the occipital horns. There is no definite third or fourth ventricular blood.  Moderate subarachnoid hemorrhage can be seen in the prepontine cistern.  Perimesencephalic subarachnoid hemorrhage is redemonstrated extending into the superior vermian cistern.  Hounsfield artifact related to the coil mass of the basilar tip obscures the regional brain.  No visible acute infarction.  Minimal pericatheter hemorrhage in the right frontal white matter.  Mild cavernous carotid vascular calcification.  Mild sphenoid sinus disease.  IMPRESSION: Significantly improved hydrocephalus status post right frontal ventricular drainage catheter placement.  Residual intraventricular and perimesencephalic subarachnoid blood.  No visible acute infarction.  Status post basilar tip aneurysm coiling.  Original Report Authenticated By: Elsie Stain, M.D.   Mr Angiogram Head Wo Contrast  10/05/2011  *RADIOLOGY REPORT*  Clinical Data:  Status post coiling of basilar tip aneurysm.  The patient is slow to wake from anesthesia.  MRI HEAD WITHOUT CONTRAST MRA HEAD WITHOUT CONTRAST  Technique:  Multiplanar, multiecho pulse sequences of the brain and surrounding structures were obtained without intravenous contrast. Angiographic images of the head were obtained using MRA technique without contrast.  Comparison:  CT angiogram of the head  09/08/2011 and cerebral arteriogram 10/05/2011.  Both studies are performed at [facility]  MRI HEAD  Findings:  The diffusion weighted images demonstrate no evidence for acute or subacute infarction.  An  axial T2 sequence was performed.  It is signify degraded by patient motion.  The there does appear to be flow within the major intracranial arteries. Right sphenoid sinus disease is noted.  Minimal mucosal thickening is present in the maxillary sinuses bilaterally.  IMPRESSION:  1.  No evidence for acute or subacute infarction. 2.  Sphenoid sinus disease.  This may be secondary to the patient's intubated status.  MRA HEAD  Findings: Atherosclerotic irregularity is present through the cavernous carotid arteries bilaterally.  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  The A1 and M1 segments are normal.  The left A1 and M1 segments are normal. There is early bifurcation of the right MCA, a normal variant.  ACA and MCA branch vessels are normal.  The vertebral arteries are codominant.  The PICA origins are visualized and normal bilaterally.  There is signal loss in the distal basilar artery and proximal left posterior cerebral artery secondary to the stent placement.  The more distal posterior cerebral arteries fill normally.  Two left and one right superior cerebellar arteries are visualized.  No residual aneurysm is evident.  IMPRESSION:  1.  Signal loss in the distal basilar and proximal left posterior cerebral artery is artifactual secondary to stent placement. 2.  No definite residual aneurysm. 3.  More normal signal in the distal posterior cerebral arteries suggests flow through those areas. 4.  Normal appearance of the superior cerebellar arteries bilaterally. 5.  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  Original Report Authenticated By: Jamesetta Orleans. MATTERN, M.D.   Mr Brain Wo Contrast  10/06/2011  *RADIOLOGY REPORT*  Clinical Data: Altered mental status.  Prior aneurysm coiling.  MRI HEAD WITHOUT CONTRAST  Technique:  Multiplanar, multiecho pulse sequences of the brain and surrounding structures were obtained according to standard protocol without  intravenous contrast.  Comparison: Preoperative CT study.  MRI 10/05/2011.  Findings: The patient was moderately uncooperative and could not remain motionless for the study.  Images are suboptimal.  Small or subtle lesions could be overlooked.  Compared with prior cross-sectional studies, there has been development of acute hydrocephalus.  Fluid of diminished T2 signal intensity layers within the lateral ventricles suggesting there is acute intraventricular hemorrhage.  There is mildly increased FLAIR signal in the perimesencephalic cisterns, greater on the right and possibly also in the prepontine cisterns suggesting subarachnoid hemorrhage. Flow voids at the base the brain are preserved. Basilar tip aneurysm coil mass noted at the tip of the basilar.  Small foci of restricted diffusion can be seen in the superior vermis on the left, right occipital pole, and possibly left posterior temporal lobe suggesting acute ischemia.  Mild restricted diffusion also suspected in the perimesencephalic and superior vermian cisterns could be associated with subarachnoid bleeding.  IMPRESSION: Considerable motion degradation.  Suboptimal examination.  Concern raised for interval development of hydrocephalus and subarachnoid/interventricular hemorrhage following decline in mental status.  Consider follow up with CT scanning or formal catheter angiogram.  Foci of parenchymal restricted diffusion could reflect acute stroke.  Correlate clinically.  Original Report Authenticated By: Elsie Stain, M.D.   Mr Brain Ltd W/o Cm  10/05/2011  *RADIOLOGY REPORT*  Clinical Data:  Status post coiling of basilar tip aneurysm.  The patient is slow to wake  from anesthesia.  MRI HEAD WITHOUT CONTRAST MRA HEAD WITHOUT CONTRAST  Technique:  Multiplanar, multiecho pulse sequences of the brain and surrounding structures were obtained without intravenous contrast. Angiographic images of the head were obtained using MRA technique without contrast.   Comparison:  CT angiogram of the head 09/08/2011 and cerebral arteriogram 10/05/2011.  Both studies are performed at [facility].  MRI HEAD  Findings:  The diffusion weighted images demonstrate no evidence for acute or subacute infarction.  An axial T2 sequence was performed.  It is signify degraded by patient motion.  The there does appear to be flow within the major intracranial arteries. Right sphenoid sinus disease is noted.  Minimal mucosal thickening is present in the maxillary sinuses bilaterally.  IMPRESSION:  1.  No evidence for acute or subacute infarction. 2.  Sphenoid sinus disease.  This may be secondary to the patient's intubated status.  MRA HEAD  Findings: Atherosclerotic irregularity is present through the cavernous carotid arteries bilaterally.  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  The A1 and M1 segments are normal.  The left A1 and M1 segments are normal. There is early bifurcation of the right MCA, a normal variant.  ACA and MCA branch vessels are normal.  The vertebral arteries are codominant.  The PICA origins are visualized and normal bilaterally.  There is signal loss in the distal basilar artery and proximal left posterior cerebral artery secondary to the stent placement.  The more distal posterior cerebral arteries fill normally.  Two left and one right superior cerebellar arteries are visualized.  No residual aneurysm is evident.  IMPRESSION:  1.  Signal loss in the distal basilar and proximal left posterior cerebral artery is artifactual secondary to stent placement. 2.  No definite residual aneurysm. 3.  More normal signal in the distal posterior cerebral arteries suggests flow through those areas. 4.  Normal appearance of the superior cerebellar arteries bilaterally. 5.  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  Original Report Authenticated By: Jamesetta Orleans. MATTERN, M.D.    Anti-infectives: Anti-infectives     Start      Dose/Rate Route Frequency Ordered Stop   10/06/11 2200   vancomycin (VANCOCIN) IVPB 1000 mg/200 mL premix     Comments: ceFAZolin (ANCEF) IVPB 1 g/50 mL premix (g) 1 g Given 10/06/11 1840       1,000 mg 200 mL/hr over 60 Minutes Intravenous Every 12 hours 10/06/11 2056     10/06/11 1804   ceFAZolin (ANCEF) 1-5 GM-% IVPB     Comments: MCMILLEN, MIKE: cabinet override         10/06/11 1804 10/07/11 0614   10/05/11 1600   vancomycin (VANCOCIN) IVPB 1000 mg/200 mL premix        1,000 mg 200 mL/hr over 60 Minutes Intravenous  Once 10/05/11 1414 10/05/11 1725   10/05/11 0945   vancomycin (VANCOCIN) IVPB 1000 mg/200 mL premix        1,000 mg 200 mL/hr over 60 Minutes Intravenous  Once 10/05/11 0930 10/05/11 1017          Assessment/Plan: s/p Procedure(s): VENTRICULOSTOMY Plan.. 1. Bolus 200 cc normal saline.  2. Continue as is.. 3. New neuro findings suggest focal ischemia due local presence of blood products versus spasm in small vessel perforators .Marland Kitchen  Discussed with family.  LOS: 2 days    Abbygayle Helfand K 10/07/2011

## 2011-10-07 NOTE — Progress Notes (Signed)
1 Day Post-Op  Subjective: Post  EDV placement yesterday late evening. Presently c/o mild rt frontal headache.and dry mouth. Denies any nausea ,vomitting,visual or speech difficulties.. NO chest pains.   Objective: Vital signs in last 24 hours: Temp:  [98.6 F (37 C)-99.3 F (37.4 C)] 99 F (37.2 C) (12/01 0800) Pulse Rate:  [53-82] 53  (12/01 0800) Resp:  [19-24] 21  (12/01 0800) BP: (121-149)/(51-102) 137/61 mmHg (12/01 0800) SpO2:  [90 %-99 %] 94 % (12/01 0800) Arterial Line BP: (145-171)/(50-66) 167/66 mmHg (12/01 0800) Weight:  [156 lb 15.5 oz (71.2 kg)] 156 lb 15.5 oz (71.2 kg) (12/01 0500)    Intake/Output from previous day: 11/30 0701 - 12/01 0700 In: 2079.7 [I.V.:1504.7; Blood:375; IV Piggyback:200] Out: 1040 [Urine:925; Drains:115] Intake/Output this shift: Total I/O In: 140 [P.O.:40; I.V.:100] Out: 4 [Drains:4]  EDV drain 4 cc/hr blood tinged CSF. On examination. More alert,awake oriented  X3.Marland Kitchen Speech comprehension normal.Appropriate output. PERLA 3mm.. EOMS full. VFs intact to confrontation. No facial asymmetry. Tongue midline. Motor. No drift of outstretched hands. Power almost 5/5 all 4s. Fine motor and coordination equal in UEs. RT groin soft Pulses intact  Lab Results:   Basename 10/06/11 0340 10/05/11 2211  WBC 14.8* 19.2*  HGB 11.8* 12.5  HCT 35.0* 36.8  PLT 211 223   BMET  Basename 10/06/11 0340  NA 140  [name] 3.1*  CL 105  CO2 25  GLUCOSE 151*  BUN 9  CREATININE 0.72  CALCIUM 8.0*   PT/INR No results found for this basename: LABPROT:2,INR:2 in the last 72 hours ABG No results found for this basename: PHART:2,PCO2:2,PO2:2,HCO3:2 in the last 72 hours  Studies/Results: Mr Angiogram Head Wo Contrast  10/05/2011  *RADIOLOGY REPORT*  Clinical Data:  Status post coiling of basilar tip aneurysm.  The patient is slow to wake from anesthesia.  MRI HEAD WITHOUT CONTRAST MRA HEAD WITHOUT CONTRAST  Technique:  Multiplanar, multiecho pulse  sequences of the brain and surrounding structures were obtained without intravenous contrast. Angiographic images of the head were obtained using MRA technique without contrast.  Comparison:  CT angiogram of the head 09/08/2011 and cerebral arteriogram 10/05/2011.  Both studies are performed at [facility].  MRI HEAD  Findings:  The diffusion weighted images demonstrate no evidence for acute or subacute infarction.  An axial T2 sequence was performed.  It is signify degraded by patient motion.  The there does appear to be flow within the major intracranial arteries. Right sphenoid sinus disease is noted.  Minimal mucosal thickening is present in the maxillary sinuses bilaterally.  IMPRESSION:  1.  No evidence for acute or subacute infarction. 2.  Sphenoid sinus disease.  This may be secondary to the patient's intubated status.  MRA HEAD  Findings: Atherosclerotic irregularity is present through the cavernous carotid arteries bilaterally.  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  The A1 and M1 segments are normal.  The left A1 and M1 segments are normal. There is early bifurcation of the right MCA, a normal variant.  ACA and MCA branch vessels are normal.  The vertebral arteries are codominant.  The PICA origins are visualized and normal bilaterally.  There is signal loss in the distal basilar artery and proximal left posterior cerebral artery secondary to the stent placement.  The more distal posterior cerebral arteries fill normally.  Two left and one right superior cerebellar arteries are visualized.  No residual aneurysm is evident.  IMPRESSION:  1.  Signal loss in the  distal basilar and proximal left posterior cerebral artery is artifactual secondary to stent placement. 2.  No definite residual aneurysm. 3.  More normal signal in the distal posterior cerebral arteries suggests flow through those areas. 4.  Normal appearance of the superior cerebellar arteries bilaterally. 5.   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  Original Report Authenticated By: Jamesetta Orleans. MATTERN, M.D.   Mr Brain Wo Contrast  10/06/2011  *RADIOLOGY REPORT*  Clinical Data: Altered mental status.  Prior aneurysm coiling.  MRI HEAD WITHOUT CONTRAST  Technique:  Multiplanar, multiecho pulse sequences of the brain and surrounding structures were obtained according to standard protocol without intravenous contrast.  Comparison: Preoperative CT study.  MRI 10/05/2011.  Findings: The patient was moderately uncooperative and could not remain motionless for the study.  Images are suboptimal.  Small or subtle lesions could be overlooked.  Compared with prior cross-sectional studies, there has been development of acute hydrocephalus.  Fluid of diminished T2 signal intensity layers within the lateral ventricles suggesting there is acute intraventricular hemorrhage.  There is mildly increased FLAIR signal in the perimesencephalic cisterns, greater on the right and possibly also in the prepontine cisterns suggesting subarachnoid hemorrhage. Flow voids at the base the brain are preserved. Basilar tip aneurysm coil mass noted at the tip of the basilar.  Small foci of restricted diffusion can be seen in the superior vermis on the left, right occipital pole, and possibly left posterior temporal lobe suggesting acute ischemia.  Mild restricted diffusion also suspected in the perimesencephalic and superior vermian cisterns could be associated with subarachnoid bleeding.  IMPRESSION: Considerable motion degradation.  Suboptimal examination.  Concern raised for interval development of hydrocephalus and subarachnoid/interventricular hemorrhage following decline in mental status.  Consider follow up with CT scanning or formal catheter angiogram.  Foci of parenchymal restricted diffusion could reflect acute stroke.  Correlate clinically.  Original Report Authenticated By: Elsie Stain, M.D.   Mr Brain Ltd  W/o Cm  10/05/2011  *RADIOLOGY REPORT*  Clinical Data:  Status post coiling of basilar tip aneurysm.  The patient is slow to wake from anesthesia.  MRI HEAD WITHOUT CONTRAST MRA HEAD WITHOUT CONTRAST  Technique:  Multiplanar, multiecho pulse sequences of the brain and surrounding structures were obtained without intravenous contrast. Angiographic images of the head were obtained using MRA technique without contrast.  Comparison:  CT angiogram of the head 09/08/2011 and cerebral arteriogram 10/05/2011.  Both studies are performed at [facility].  MRI HEAD  Findings:  The diffusion weighted images demonstrate no evidence for acute or subacute infarction.  An axial T2 sequence was performed.  It is signify degraded by patient motion.  The there does appear to be flow within the major intracranial arteries. Right sphenoid sinus disease is noted.  Minimal mucosal thickening is present in the maxillary sinuses bilaterally.  IMPRESSION:  1.  No evidence for acute or subacute infarction. 2.  Sphenoid sinus disease.  This may be secondary to the patient's intubated status.  MRA HEAD  Findings: Atherosclerotic irregularity is present through the cavernous carotid arteries bilaterally.  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  The A1 and M1 segments are normal.  The left A1 and M1 segments are normal. There is early bifurcation of the right MCA, a normal variant.  ACA and MCA branch vessels are normal.  The vertebral arteries are codominant.  The PICA origins are visualized and normal bilaterally.  There is signal loss in the  distal basilar artery and proximal left posterior cerebral artery secondary to the stent placement.  The more distal posterior cerebral arteries fill normally.  Two left and one right superior cerebellar arteries are visualized.  No residual aneurysm is evident.  IMPRESSION:  1.  Signal loss in the distal basilar and proximal left posterior cerebral artery is  artifactual secondary to stent placement. 2.  No definite residual aneurysm. 3.  More normal signal in the distal posterior cerebral arteries suggests flow through those areas. 4.  Normal appearance of the superior cerebellar arteries bilaterally. 5.  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  Original Report Authenticated By: Jamesetta Orleans. MATTERN, M.D.    Anti-infectives: Anti-infectives     Start     Dose/Rate Route Frequency Ordered Stop   10/06/11 2200   vancomycin (VANCOCIN) IVPB 1000 mg/200 mL premix     Comments: ceFAZolin (ANCEF) IVPB 1 g/50 mL premix (g) 1 g Given 10/06/11 1840       1,000 mg 200 mL/hr over 60 Minutes Intravenous Every 12 hours 10/06/11 2056     10/06/11 1804   ceFAZolin (ANCEF) 1-5 GM-% IVPB     Comments: MCMILLEN, MIKE: cabinet override         10/06/11 1804 10/07/11 0614   10/05/11 1600   vancomycin (VANCOCIN) IVPB 1000 mg/200 mL premix        1,000 mg 200 mL/hr over 60 Minutes Intravenous  Once 10/05/11 1414 10/05/11 1725   10/05/11 0945   vancomycin (VANCOCIN) IVPB 1000 mg/200 mL premix        1,000 mg 200 mL/hr over 60 Minutes Intravenous  Once 10/05/11 0930 10/05/11 1017          Assessment/Plan: s/p Procedure(s): VENTRICULOSTOMY  !... Advance to clear liquids. 2. D/C rt groin sheath.. 3. Will restart plavix.Marland Kitchen 4. EDV management per NES ,Dr Franky Macho. 7. Chem 7   LOS: 2 days    Jill [name] 10/07/2011

## 2011-10-07 NOTE — Progress Notes (Signed)
Pt removed foley stating "it smelled". Used call button to alert staff foley had been removed. Explained to me the full balloon slipped out and she did not pull it out. No bleeding from site noted. Pt does not c/o of any discomfort. NT plans to give bath to pt to alleviate "smell". Will continue to monitor for need for mittens.

## 2011-10-08 ENCOUNTER — Inpatient Hospital Stay (HOSPITAL_COMMUNITY): Payer: Medicaid Other

## 2011-10-08 LAB — BASIC METABOLIC PANEL
CO2: 24 mEq/L (ref 19–32)
Calcium: 8.6 mg/dL (ref 8.4–10.5)
Glucose, Bld: 123 mg/dL — ABNORMAL HIGH (ref 70–99)
Potassium: 3.4 mEq/L — ABNORMAL LOW (ref 3.5–5.1)
Sodium: 138 mEq/L (ref 135–145)

## 2011-10-08 LAB — PLATELET INHIBITION P2Y12: Platelet Function  P2Y12: 234 [PRU] (ref 194–418)

## 2011-10-08 MED ORDER — KETOROLAC TROMETHAMINE 30 MG/ML IJ SOLN
INTRAMUSCULAR | Status: AC
  Administered 2011-10-08: 30 mg
  Filled 2011-10-08: qty 1

## 2011-10-08 MED ORDER — HYDRALAZINE HCL 20 MG/ML IJ SOLN
5.0000 mg | Freq: Once | INTRAMUSCULAR | Status: AC
  Administered 2011-10-08: 5 mg via INTRAVENOUS
  Filled 2011-10-08: qty 1

## 2011-10-08 MED ORDER — KETOROLAC TROMETHAMINE 15 MG/ML IJ SOLN
15.0000 mg | Freq: Once | INTRAMUSCULAR | Status: DC
  Filled 2011-10-08: qty 1

## 2011-10-08 MED ORDER — GUAIFENESIN-DM 100-10 MG/5ML PO SYRP
15.0000 mL | ORAL_SOLUTION | Freq: Four times a day (QID) | ORAL | Status: DC | PRN
  Administered 2011-10-08: 5 mL via ORAL
  Administered 2011-10-08: 10 mL via ORAL
  Administered 2011-10-10 – 2011-10-12 (×3): 15 mL via ORAL
  Filled 2011-10-08 (×2): qty 15
  Filled 2011-10-08: qty 5
  Filled 2011-10-08 (×2): qty 15
  Filled 2011-10-08: qty 5

## 2011-10-08 MED ORDER — HYDROCODONE-ACETAMINOPHEN 5-325 MG PO TABS
1.0000 | ORAL_TABLET | Freq: Once | ORAL | Status: AC
  Administered 2011-10-08: 1 via ORAL
  Filled 2011-10-08: qty 1

## 2011-10-08 MED ORDER — ASPIRIN 81 MG PO CHEW
162.0000 mg | CHEWABLE_TABLET | Freq: Every day | ORAL | Status: DC
  Administered 2011-10-08 – 2011-10-22 (×15): 162 mg via ORAL
  Filled 2011-10-08 (×2): qty 2
  Filled 2011-10-08 (×2): qty 1
  Filled 2011-10-08 (×12): qty 2

## 2011-10-08 MED ORDER — HYDRALAZINE HCL 20 MG/ML IJ SOLN
5.0000 mg | INTRAMUSCULAR | Status: DC | PRN
  Administered 2011-10-08: 10:00:00 via INTRAVENOUS
  Administered 2011-10-08 – 2011-10-13 (×17): 5 mg via INTRAVENOUS
  Filled 2011-10-08 (×18): qty 1

## 2011-10-08 NOTE — Progress Notes (Signed)
Stroke Team Progress Note  SUBJECTIVE Jill [name] is a 60 y.o. female who was admitted for for endovasuclar coiling of an unruptured basilar tip Aneurysm on 10/05/2011. She received a stent in the basilar artery necessitating plavix post procedure. She woke up very slowly after her procedure and was found to have intraventricular rhemorrhage requiring ventriculostomy.she remained stable awake and following commands. She had a repeat CT scan of the head this morning which appears to show improvement in ventricular size and stable appearance of interventricular hemorrhage. OBJECTIVE Most recent Vital Signs: Temp: 98.6 F (37 C) (12/01 2328) Temp src: Oral (12/01 2328) BP: 191/73 mmHg (12/02 1009) Pulse Rate: 60  (12/02 1009) Respiratory Rate: 21 O2 Saturdation: 95%  CBG (last 3)   Basename 10/07/11 2231 10/07/11 1152 10/07/11 0810  GLUCAP 115* 142* 126*   Intake/Output from previous day: 12/01 0701 - 12/02 0700 In: 2980 [P.O.:230; I.V.:2350; IV Piggyback:400] Out: 1803 [Urine:1575; Drains:228]  IV Fluid Intake:     . sodium chloride 100 mL/hr at 10/08/11 0901   Diet: Clear Liquid   Activity: bedrest DVT Prophylaxis:   scds Studies: Results for orders placed during the hospital encounter of 10/05/11 (from the past 24 hour(s))  BASIC METABOLIC PANEL     Status: Abnormal   Collection Time   10/07/11 11:21 AM      Component Value Range   Sodium 144  135 - 145 (mEq/L)   Potassium 3.1 (*) 3.5 - 5.1 (mEq/L)   Chloride 109  96 - 112 (mEq/L)   CO2 26  19 - 32 (mEq/L)   Glucose, Bld 142 (*) 70 - 99 (mg/dL)   BUN 16  6 - 23 (mg/dL)   Creatinine, Ser 1.61  0.50 - 1.10 (mg/dL)   Calcium 8.8  8.4 - 09.6 (mg/dL)   GFR calc non Af Amer 64 (*) >90 (mL/min)   GFR calc Af Amer 74 (*) >90 (mL/min)  GLUCOSE, CAPILLARY     Status: Abnormal   Collection Time   10/07/11 11:52 AM      Component Value Range   Glucose-Capillary 142 (*) 70 - 99 (mg/dL)  GLUCOSE, CAPILLARY     Status:  Abnormal   Collection Time   10/07/11 10:31 PM      Component Value Range   Glucose-Capillary 115 (*) 70 - 99 (mg/dL)  BASIC METABOLIC PANEL     Status: Abnormal   Collection Time   10/08/11  5:00 AM      Component Value Range   Sodium 138  135 - 145 (mEq/L)   Potassium 3.4 (*) 3.5 - 5.1 (mEq/L)   Chloride 106  96 - 112 (mEq/L)   CO2 24  19 - 32 (mEq/L)   Glucose, Bld 123 (*) 70 - 99 (mg/dL)   BUN 18  6 - 23 (mg/dL)   Creatinine, Ser 0.45  0.50 - 1.10 (mg/dL)   Calcium 8.6  8.4 - 40.9 (mg/dL)   GFR calc non Af Amer 77 (*) >90 (mL/min)   GFR calc Af Amer 90 (*) >90 (mL/min)     Ct Head Wo Contrast  10/08/2011  *RADIOLOGY REPORT*  Clinical Data: Follow-up aneurysm coiling.  CT HEAD WITHOUT CONTRAST  Technique:  Contiguous axial images were obtained from the base of the skull through the vertex without contrast.  Comparison: Multiple priors most recent 10/07/2011  Findings: Increasing hemorrhage around the shunt catheter in the white matter of the right frontal lobe with a halo of edema, and a tiny  dot of pneumocephalus, now measures 24 x 24 mm cross-section (image 22 series 2).   Mild hydrocephalus with biventricular diameter 36 mm, but overall unchanged from December 1 and significantly improved from 11/30.  Subarachnoid and intraventricular blood persists and is stable. No definite areas of acute infarction.  IMPRESSION: Increasing hemorrhage surrounding the parenchymal course of the right frontal catheter.  Dr. Corliss Skains aware. No change hydrocephalus or subarachnoid hemorrhage.  Original Report Authenticated By: Elsie Stain, M.D.   Ct Head Wo Contrast  10/07/2011  *RADIOLOGY REPORT*  Clinical Data: Sudden onset altered mental status and facial drooping 24 hours ago.  Recent basilar tip aneurysm coiling.  CT HEAD WITHOUT CONTRAST  Technique:  Contiguous axial images were obtained from the base of the skull through the vertex without contrast.  Comparison: Most recent MR 10/06/2011   Findings: Interval placement of a right frontal ventriculostomy with improved hydrocephalus.  Intraventricular blood layers posteriorly in the occipital horns. There is no definite third or fourth ventricular blood.  Moderate subarachnoid hemorrhage can be seen in the prepontine cistern.  Perimesencephalic subarachnoid hemorrhage is redemonstrated extending into the superior vermian cistern.  Hounsfield artifact related to the coil mass of the basilar tip obscures the regional brain.  No visible acute infarction.  Minimal pericatheter hemorrhage in the right frontal white matter.  Mild cavernous carotid vascular calcification.  Mild sphenoid sinus disease.  IMPRESSION: Significantly improved hydrocephalus status post right frontal ventricular drainage catheter placement.  Residual intraventricular and perimesencephalic subarachnoid blood.  No visible acute infarction.  Status post basilar tip aneurysm coiling.  Original Report Authenticated By: Elsie Stain, M.D.   Mr Brain Wo Contrast  10/06/2011  *RADIOLOGY REPORT*  Clinical Data: Altered mental status.  Prior aneurysm coiling.  MRI HEAD WITHOUT CONTRAST  Technique:  Multiplanar, multiecho pulse sequences of the brain and surrounding structures were obtained according to standard protocol without intravenous contrast.  Comparison: Preoperative CT study.  MRI 10/05/2011.  Findings: The patient was moderately uncooperative and could not remain motionless for the study.  Images are suboptimal.  Small or subtle lesions could be overlooked.  Compared with prior cross-sectional studies, there has been development of acute hydrocephalus.  Fluid of diminished T2 signal intensity layers within the lateral ventricles suggesting there is acute intraventricular hemorrhage.  There is mildly increased FLAIR signal in the perimesencephalic cisterns, greater on the right and possibly also in the prepontine cisterns suggesting subarachnoid hemorrhage. Flow voids at the base  the brain are preserved. Basilar tip aneurysm coil mass noted at the tip of the basilar.  Small foci of restricted diffusion can be seen in the superior vermis on the left, right occipital pole, and possibly left posterior temporal lobe suggesting acute ischemia.  Mild restricted diffusion also suspected in the perimesencephalic and superior vermian cisterns could be associated with subarachnoid bleeding.  IMPRESSION: Considerable motion degradation.  Suboptimal examination.  Concern raised for interval development of hydrocephalus and subarachnoid/interventricular hemorrhage following decline in mental status.  Consider follow up with CT scanning or formal catheter angiogram.  Foci of parenchymal restricted diffusion could reflect acute stroke.  Correlate clinically.  Original Report Authenticated By: Elsie Stain, M.D.    Physical Exam:    Pleasant middle-aged Caucasian lady currently not in distress. Afebrile. Head is not dramatic except for when the colostomy catheter. Neck is supple without bruit. Cardiac exam no murmur or gallop. Lungs clear to auscultation.  Neurological exam awake alert oriented to place and person. Diminished attention and recall.  Speech and language appear normal. Eye movements are full range without nystagmus. Face is symmetric without weakness. Tongue is midline. Motor system exam reveals symmetric and equal strength in all 4 extremities. Deep tendonre flexes 2+ symmetric plantars are downgoing.  ASSESSMENT Jill [name] is a 60 y.o. female  with a elective coiling of a symptomatic basilar tip aneurysm complicated by small intraventricular bleeding with mild hydrocephalus status post ventriculostomy with significant clinical improvement    Hospital day # 3  TREATMENT/PLAN  with continue present treatment plan.Strict control of blood pressure.   Ventriculostomy cranial and discontinue as per neurosurgery.discussed with daughter and Dr.  Newell Coral.    Gates Rigg, MD [facility] Pager: [phone] 10/08/2011 10:56 AM

## 2011-10-08 NOTE — Progress Notes (Signed)
2 Days Post-Op  Subjective: C/O  Rt frontal headache in early morning.  Interval complete resolution of lt facial droop and perioral numbness. . Mild nausea. Cough++.  CT Brain in early AM shows a small rt frontal hemorrhage along entry site of ventriculostomy..No sig change in intraventricular blood,or mild prominence of the vents.No new SAH Objective: Vital signs in last 24 hours: Temp:  [97.4 F (36.3 C)-98.6 F (37 C)] 98.6 F (37 C) (12/01 2328) Pulse Rate:  [48-76] 60  (12/02 0800) Resp:  [18-28] 24  (12/02 0800) BP: (99-176)/(57-81) 171/70 mmHg (12/02 0800) SpO2:  [91 %-97 %] 92 % (12/02 0800) Arterial Line BP: (162)/(66) 162/66 mmHg (12/01 0900)    Intake/Output from previous day: 12/01 0701 - 12/02 0700 In: 2780 [P.O.:230; I.V.:2350; IV Piggyback:200] Out: 1803 [Urine:1575; Drains:228] Intake/Output this shift: Total I/O In: 100 [I.V.:100] Out: -   O/E . Vent draining bloody csf at appr 4cc/hr. Awake . Alert .Marland KitchenOriented x3 Speech and comprehension clear. PERLA 3mm. EOMS full. VFs intact. No facial asymmetry. Tongue midline. Motor. No drift of UES. Power 5/5 prox and distally. Fine motor and coordination finger to nose equal . RT groin soft.    Lab Results:   [facility] 10/06/11 0340 10/05/11 2211  WBC 14.8* 19.2*  HGB 11.8* 12.5  HCT 35.0* 36.8  PLT 211 223   BMET  Basename 10/08/11 0500 10/07/11 1121  NA 138 144  [name] 3.4* 3.1*  CL 106 109  CO2 24 26  GLUCOSE 123* 142*  BUN 18 16  CREATININE 0.80 0.94  CALCIUM 8.6 8.8   PT/INR No results found for this basename: LABPROT:2,INR:2 in the last 72 hours ABG No results found for this basename: PHART:2,PCO2:2,PO2:2,HCO3:2 in the last 72 hours  Studies/Results: Ct Head Wo Contrast  10/07/2011  *RADIOLOGY REPORT*  Clinical Data: Sudden onset altered mental status and facial drooping 24 hours ago.  Recent basilar tip aneurysm coiling.  CT HEAD WITHOUT CONTRAST  Technique:  Contiguous axial images  were obtained from the base of the skull through the vertex without contrast.  Comparison: Most recent MR 10/06/2011  Findings: Interval placement of a right frontal ventriculostomy with improved hydrocephalus.  Intraventricular blood layers posteriorly in the occipital horns. There is no definite third or fourth ventricular blood.  Moderate subarachnoid hemorrhage can be seen in the prepontine cistern.  Perimesencephalic subarachnoid hemorrhage is redemonstrated extending into the superior vermian cistern.  Hounsfield artifact related to the coil mass of the basilar tip obscures the regional brain.  No visible acute infarction.  Minimal pericatheter hemorrhage in the right frontal white matter.  Mild cavernous carotid vascular calcification.  Mild sphenoid sinus disease.  IMPRESSION: Significantly improved hydrocephalus status post right frontal ventricular drainage catheter placement.  Residual intraventricular and perimesencephalic subarachnoid blood.  No visible acute infarction.  Status post basilar tip aneurysm coiling.  Original Report Authenticated By: Elsie Stain, M.D.   Mr Brain Wo Contrast  10/06/2011  *RADIOLOGY REPORT*  Clinical Data: Altered mental status.  Prior aneurysm coiling.  MRI HEAD WITHOUT CONTRAST  Technique:  Multiplanar, multiecho pulse sequences of the brain and surrounding structures were obtained according to standard protocol without intravenous contrast.  Comparison: Preoperative CT study.  MRI 10/05/2011.  Findings: The patient was moderately uncooperative and could not remain motionless for the study.  Images are suboptimal.  Small or subtle lesions could be overlooked.  Compared with prior cross-sectional studies, there has been development of acute hydrocephalus.  Fluid of diminished T2 signal intensity  layers within the lateral ventricles suggesting there is acute intraventricular hemorrhage.  There is mildly increased FLAIR signal in the perimesencephalic cisterns, greater  on the right and possibly also in the prepontine cisterns suggesting subarachnoid hemorrhage. Flow voids at the base the brain are preserved. Basilar tip aneurysm coil mass noted at the tip of the basilar.  Small foci of restricted diffusion can be seen in the superior vermis on the left, right occipital pole, and possibly left posterior temporal lobe suggesting acute ischemia.  Mild restricted diffusion also suspected in the perimesencephalic and superior vermian cisterns could be associated with subarachnoid bleeding.  IMPRESSION: Considerable motion degradation.  Suboptimal examination.  Concern raised for interval development of hydrocephalus and subarachnoid/interventricular hemorrhage following decline in mental status.  Consider follow up with CT scanning or formal catheter angiogram.  Foci of parenchymal restricted diffusion could reflect acute stroke.  Correlate clinically.  Original Report Authenticated By: Elsie Stain, M.D.    Anti-infectives: Anti-infectives     Start     Dose/Rate Route Frequency Ordered Stop   10/06/11 2200   vancomycin (VANCOCIN) IVPB 1000 mg/200 mL premix     Comments: ceFAZolin (ANCEF) IVPB 1 g/50 mL premix (g) 1 g Given 10/06/11 1840       1,000 mg 200 mL/hr over 60 Minutes Intravenous Every 12 hours 10/06/11 2056     10/06/11 1804   ceFAZolin (ANCEF) 1-5 GM-% IVPB     Comments: MCMILLEN, MIKE: cabinet override         10/06/11 1804 10/07/11 0614   10/05/11 1600   vancomycin (VANCOCIN) IVPB 1000 mg/200 mL premix        1,000 mg 200 mL/hr over 60 Minutes Intravenous  Once 10/05/11 1414 10/05/11 1725   10/05/11 0945   vancomycin (VANCOCIN) IVPB 1000 mg/200 mL premix        1,000 mg 200 mL/hr over 60 Minutes Intravenous  Once 10/05/11 0930 10/05/11 1017          Assessment/Plan: s/p Procedure(s): VENTRICULOSTOMY 1. Stop plavix.. Continue with aspirin.. 2. Clear liquid diet... 3. Robitussin elixir for cough.. 4. Apressoline 5mm iv  q 4 hry. for  BPsystolic >112mmhg. 5. Discussed with patient and spouse.Marland Kitchen  Jill [name] 10/08/2011

## 2011-10-08 NOTE — Progress Notes (Signed)
Pt found sitting up in bed. IVC drain dumped 20cc of bloody drainage. Explained to pt that sitting up causes the drain to be lower and would result in more drainage that would cause her head to hurt worst.

## 2011-10-08 NOTE — Progress Notes (Signed)
Pt found sitting on edge of bed. It was noted IVC had app 15 ml of bloody drainage. Pt is more confused. Facial droop absence, grips strong and no drift present. BP slightly elevated. Pt has managed to pull pressure drsg off right groin. Groin is dry without evidence of bleeding and pedal pulse is 2+. Mittens placed will continue to monitor pt closely.

## 2011-10-08 NOTE — Progress Notes (Signed)
Subjective:  Patient resting in bed comfortably without complaints. Patient was given Plavix yesterday and continues to be given aspirin despite intraventricular hemorrhage following interventional radiologic procedure last week per Dr. Jaynie Bream instruction.. CT this morning does show a small amount of blood on the IVC tract consistent with the Plavix and aspirin the patient has been being given. Ventricles are significantly decreased in size as compared to prior to IVC placement and IVC is draining well, blood-tinged CSF.  Objective: Vital signs in last 24 hours: Filed Vitals:   10/08/11 0800 10/08/11 0900 10/08/11 1000 10/08/11 1009  BP: 171/70 153/66 191/73 191/73  Pulse: 60 49 46 60  Temp:      TempSrc:      Resp: 24 25 21    Height:      Weight:      SpO2: 92% 94% 95%     Intake/Output from previous day: 12/01 0701 - 12/02 0700 In: 2980 [P.O.:230; I.V.:2350; IV Piggyback:400] Out: 1803 [Urine:1575; Drains:228] Intake/Output this shift: Total I/O In: 487.7 [P.O.:80; I.V.:201.7; IV Piggyback:206] Out: 618 [Urine:600; Drains:18]  Physical Exam:  Awake alert oriented to name [facility] hospital and December 2012. Following commands speech is fluent. Pupils are equal round and reactive to light extraocular movements are intact facial movement is symmetrical. Motor examination shows 5 over 5 strength with no drift of the upper extremities.  CBC  Basename 10/06/11 0340 10/05/11 2211  WBC 14.8* 19.2*  HGB 11.8* 12.5  HCT 35.0* 36.8  PLT 211 223   BMET  Basename 10/08/11 0500 10/07/11 1121  NA 138 144  K 3.4* 3.1*  CL 106 109  CO2 24 26  GLUCOSE 123* 142*  BUN 18 16  CREATININE 0.80 0.94  CALCIUM 8.6 8.8     Assessment/Plan: We will continue to have the IVC drain at 10 cm of water and we'll check another CT in the a.m.   Hewitt Shorts, MD 10/08/2011, 10:30 AM

## 2011-10-09 ENCOUNTER — Inpatient Hospital Stay (HOSPITAL_COMMUNITY): Payer: Medicaid Other

## 2011-10-09 LAB — DIFFERENTIAL
Basophils Absolute: 0 10*3/uL (ref 0.0–0.1)
Basophils Relative: 0 % (ref 0–1)
Eosinophils Absolute: 0 10*3/uL (ref 0.0–0.7)
Monocytes Absolute: 0.8 10*3/uL (ref 0.1–1.0)
Monocytes Relative: 7 % (ref 3–12)
Neutro Abs: 10.5 10*3/uL — ABNORMAL HIGH (ref 1.7–7.7)
Neutrophils Relative %: 83 % — ABNORMAL HIGH (ref 43–77)

## 2011-10-09 NOTE — Progress Notes (Signed)
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 Intraventricular bleed following procedure---ventriculostomy placed 11/30. Pt recovering well. Still has Rt sided H/A. No N/V Cognition improving daily  Objective: Vital signs in last 24 hours: Temp:  [97.4 F (36.3 C)-98 F (36.7 C)] 97.4 F (36.3 C) (12/03 0400) Pulse Rate:  [44-64] 48  (12/03 0700) Resp:  [18-25] 21  (12/03 0700) BP: (139-191)/(49-86) 162/69 mmHg (12/03 0700) SpO2:  [91 %-97 %] 96 % (12/03 0700)    Intake/Output from previous day: 12/02 0701 - 12/03 0700 In: 2842.7 [P.O.:320; I.V.:2316.7; IV Piggyback:206] Out: 3340 [Urine:3060; Drains:280] Intake/Output this shift:    PE:  VSS; afeb  EOMI; good strength bilat all extrs. Follows all commands. No facial assymetry Speech coherent Rt groin NT/No bleeding/no hematoma Rt Ventriculostomy blood tinged fluid--approx 450cc in bag now  Lab Results:  No results found for this basename: WBC:2,HGB:2,HCT:2,PLT:2 in the last 72 hours BMET  Basename 10/08/11 0500 10/07/11 1121  NA 138 144  K 3.4* 3.1*  CL 106 109  CO2 24 26  GLUCOSE 123* 142*  BUN 18 16  CREATININE 0.80 0.94  CALCIUM 8.6 8.8   PT/INR No results found for this basename: LABPROT:2,INR:2 in the last 72 hours ABG No results found for this basename: PHART:2,PCO2:2,PO2:2,HCO3:2 in the last 72 hours  Studies/Results: Ct Head Wo Contrast  10/08/2011  *RADIOLOGY REPORT*  Clinical Data: Follow-up aneurysm coiling.  CT HEAD WITHOUT CONTRAST  Technique:  Contiguous axial images were obtained from the base of the skull through the vertex without contrast.  Comparison: Multiple priors most recent 10/07/2011  Findings: Increasing hemorrhage around the shunt catheter in the white matter of the right frontal lobe with a halo of edema, and a tiny dot of pneumocephalus, now measures 24 x 24 mm cross-section (image 22 series 2).   Mild hydrocephalus with biventricular  diameter 36 mm, but overall unchanged from December 1 and significantly improved from 11/30.  Subarachnoid and intraventricular blood persists and is stable. No definite areas of acute infarction.  IMPRESSION: Increasing hemorrhage surrounding the parenchymal course of the right frontal catheter.  Dr. Corliss Skains aware. No change hydrocephalus or subarachnoid hemorrhage.  Original Report Authenticated By: Elsie Stain, M.D.   Ct Head Wo Contrast  10/07/2011  *RADIOLOGY REPORT*  Clinical Data: Sudden onset altered mental status and facial drooping 24 hours ago.  Recent basilar tip aneurysm coiling.  CT HEAD WITHOUT CONTRAST  Technique:  Contiguous axial images were obtained from the base of the skull through the vertex without contrast.  Comparison: Most recent MR 10/06/2011  Findings: Interval placement of a right frontal ventriculostomy with improved hydrocephalus.  Intraventricular blood layers posteriorly in the occipital horns. There is no definite third or fourth ventricular blood.  Moderate subarachnoid hemorrhage can be seen in the prepontine cistern.  Perimesencephalic subarachnoid hemorrhage is redemonstrated extending into the superior vermian cistern.  Hounsfield artifact related to the coil mass of the basilar tip obscures the regional brain.  No visible acute infarction.  Minimal pericatheter hemorrhage in the right frontal white matter.  Mild cavernous carotid vascular calcification.  Mild sphenoid sinus disease.  IMPRESSION: Significantly improved hydrocephalus status post right frontal ventricular drainage catheter placement.  Residual intraventricular and perimesencephalic subarachnoid blood.  No visible acute infarction.  Status post basilar tip aneurysm coiling.  Original Report Authenticated By: Elsie Stain, M.D.   Ct Portable Head W/o Cm  10/09/2011  *RADIOLOGY REPORT*  Clinical Data: Decompressed of hydrocephalus.  CT HEAD WITHOUT CONTRAST  Technique:  Contiguous axial images were  obtained from the base of the skull through the vertex without contrast.  Comparison: 10/08/2011.  Findings: Post basilar tip aneurysm coiling and stenting.  Subarachnoid hemorrhage and interventricular hemorrhage remains.  Right ventricular shunt is in place with the tip at the level of the right frontal horn. Blood is seen along the course of the catheter with surrounding vasogenic edema without significant change.  The degree of temporal horn prominence is unchanged without definitive findings of hydrocephalus.  No large acute thrombotic infarct.  No intracranial mass lesion noted on this unenhanced exam.  IMPRESSION: No significant change.  Please see above.  Original Report Authenticated By: Fuller Canada, M.D.    Anti-infectives: Anti-infectives     Start     Dose/Rate Route Frequency Ordered Stop   10/06/11 2200   vancomycin (VANCOCIN) IVPB 1000 mg/200 mL premix     Comments: ceFAZolin (ANCEF) IVPB 1 g/50 mL premix (g) 1 g Given 10/06/11 1840       1,000 mg 200 mL/hr over 60 Minutes Intravenous Every 12 hours 10/06/11 2056     10/06/11 1804   ceFAZolin (ANCEF) 1-5 GM-% IVPB     Comments: MCMILLEN, MIKE: cabinet override         10/06/11 1804 10/07/11 0614   10/05/11 1600   vancomycin (VANCOCIN) IVPB 1000 mg/200 mL premix        1,000 mg 200 mL/hr over 60 Minutes Intravenous  Once 10/05/11 1414 10/05/11 1725   10/05/11 0945   vancomycin (VANCOCIN) IVPB 1000 mg/200 mL premix        1,000 mg 200 mL/hr over 60 Minutes Intravenous  Once 10/05/11 0930 10/05/11 1017          Assessment/Plan: s/p Procedure(s): VENTRICULOSTOMY   Basilar artery aneurysm stent/coil 11/29 With intraventricular bleed following. Pt improving. Will report to Dr Bradly Chris A 10/09/2011

## 2011-10-09 NOTE — Progress Notes (Signed)
Stroke Team Progress Note  SUBJECTIVE  Jill [name] is a 60 y.o. female whose stroke symptoms occurred 4 days ago following a  basilar stent placement for coiling of basilar tip aneurysm.. She presented with symptoms of drowsiness and difficulties waking up post procedure. Overall she feels her condition is gradually improving.   OBJECTIVE Most recent Vital Signs: Temp: 97.7 F (36.5 C) (12/03 0807) Temp src: Oral (12/03 0807) BP: 158/64 mmHg (12/03 0800) Pulse Rate: 48  (12/03 0800) Respiratory Rate: 20 O2 Saturdation: 96%  CBG (last 3)   Basename 10/07/11 2231 10/07/11 1152 10/07/11 0810  GLUCAP 115* 142* 126*   Intake/Output from previous day: 12/02 0701 - 12/03 0700 In: 2842.7 [P.O.:320; I.V.:2316.7; IV Piggyback:206] Out: 3340 [Urine:3060; Drains:280]  IV Fluid Intake:     . sodium chloride 100 mL/hr at 10/09/11 0800   Diet:  Clear Liquid thin liquids  Activity:  Bedrest  DVT Prophylaxis:  SCDs   Studies: CBC    Component Value Date/Time   WBC 14.8* 10/06/2011 0340   RBC 3.68* 10/06/2011 0340   HGB 11.8* 10/06/2011 0340   HCT 35.0* 10/06/2011 0340   PLT 211 10/06/2011 0340   MCV 95.1 10/06/2011 0340   MCH 32.1 10/06/2011 0340   MCHC 33.7 10/06/2011 0340   RDW 12.8 10/06/2011 0340   LYMPHSABS 1.0 10/06/2011 0340   MONOABS 0.6 10/06/2011 0340   EOSABS 0.0 10/06/2011 0340   BASOSABS 0.0 10/06/2011 0340   CMP    Component Value Date/Time   NA 138 10/08/2011 0500   K 3.4* 10/08/2011 0500   CL 106 10/08/2011 0500   CO2 24 10/08/2011 0500   GLUCOSE 123* 10/08/2011 0500   BUN 18 10/08/2011 0500   CREATININE 0.80 10/08/2011 0500   CALCIUM 8.6 10/08/2011 0500   PROT 7.3 10/03/2011 1404   ALBUMIN 3.9 10/03/2011 1404   AST 15 10/03/2011 1404   ALT 10 10/03/2011 1404   ALKPHOS 66 10/03/2011 1404   BILITOT 0.4 10/03/2011 1404   GFRNONAA 77* 10/08/2011 0500   GFRAA 90* 10/08/2011 0500   COAGS Lab Results  Component Value Date   INR 0.95 10/03/2011   Lipid  Panel    Component Value Date/Time   CHOL 121 05/13/2011 0720   TRIG 45 05/13/2011 0720   HDL 43 05/13/2011 0720   CHOLHDL 2.8 05/13/2011 0720   VLDL 9 05/13/2011 0720   LDLCALC 69 05/13/2011 0720   HgbA1C  No results found for this basename: HGBA1C   Urine Drug Screen  No results found for this basename: labopia, cocainscrnur, labbenz, amphetmu, thcu, labbarb    Alcohol Level No results found for this basename: eth     CT of the brain  10/09/2011 No significant change.   10/08/2011 Increasing hemorrhage surrounding the parenchymal course of the right frontal catheter.  Dr. Corliss Skains aware. No change hydrocephalus or subarachnoid hemorrhage.  10/07/2011 Significantly improved hydrocephalus status post right frontal ventricular drainage catheter placement.  Residual intraventricular and perimesencephalic subarachnoid blood.  No visible acute infarction.  Status post basilar tip aneurysm coiling.    Physical Exam  Pleasant middle-aged Caucasian lady currently not in distress. Afebrile. Head is not dramatic except for when the colostomy catheter. Neck is supple without bruit. Cardiac exam no murmur or gallop. Lungs clear to auscultation.  Neurological exam awake alert oriented to place and person. Diminished attention and recall. Speech and language appear normal. Eye movements are full range without nystagmus. Face is symmetric without weakness. Tongue is midline.  Motor system exam reveals symmetric and equal strength in all 4 extremities. Deep tendonre flexes 2+ symmetric plantars are downgoing.  ASSESSMENT Jill [name] is a 60 y.o. female with a small intraventricular hemorrhage following an elective coiling of asymptomatic basilar tip aneurysm with mild hydrocephalus s/p IVC placement with significant clinical improvement. IVC remains in place.   Stroke risk factors:  hyperlipidemia, hypertension, smoking and CAD  Hospital day # 4  TREATMENT/PLAN Continue IVC. Agree with CT in am. Add  official order for SCDs.  Joaquin Music, ANP-BC, GNP-BC [facility] Pager: [phone] 10/09/2011 8:43 AM  Dr. Delia Heady, Stroke Center Medical Director, has personally reviewed chart, pertinent data, examined the patient and developed the plan of care.

## 2011-10-09 NOTE — Progress Notes (Signed)
Subjective: Patient reports headaches with coughing. Feels better than yesterday  Objective: Vital signs in last 24 hours: Temp:  [97.4 F (36.3 C)-98.6 F (37 C)] 98.6 F (37 C) (12/03 1547) Pulse Rate:  [43-61] 44  (12/03 1700) Resp:  [17-25] 22  (12/03 1700) BP: (129-178)/(49-86) 167/71 mmHg (12/03 1700) SpO2:  [93 %-97 %] 95 % (12/03 1700)  Intake/Output from previous day: 12/02 0701 - 12/03 0700 In: 2842.7 [P.O.:320; I.V.:2316.7; IV Piggyback:206] Out: 3340 [Urine:3060; Drains:280] Intake/Output this shift: Total I/O In: 2121.7 [P.O.:950; I.V.:771.7; IV Piggyback:400] Out: 2097 [Urine:2050; Drains:47]  Neurologic: Mental status: Alert, oriented, thought content appropriate Cranial nerves: II: pupils equal, round, reactive to light and accommodation, III,IV,VI: extraocular muscles extra-ocular motions intact, VII: upper facial muscle function normal bilaterally, VII: lower facial muscle function normal bilaterally, VIII: hearing intact to voice, IX: soft palate elevation normal bilaterally, XI: trapezius strength normal bilaterally, XII: tongue strength normal  Motor: Normal Coordination: normal  Lab Results: No results found for this basename: WBC:2,HGB:2,HCT:2,PLT:2 in the last 72 hours BMET  Basename 10/08/11 0500 10/07/11 1121  NA 138 144  K 3.4* 3.1*  CL 106 109  CO2 24 26  GLUCOSE 123* 142*  BUN 18 16  CREATININE 0.80 0.94  CALCIUM 8.6 8.8    Studies/Results: Ct Head Wo Contrast  10/08/2011  *RADIOLOGY REPORT*  Clinical Data: Follow-up aneurysm coiling.  CT HEAD WITHOUT CONTRAST  Technique:  Contiguous axial images were obtained from the base of the skull through the vertex without contrast.  Comparison: Multiple priors most recent 10/07/2011  Findings: Increasing hemorrhage around the shunt catheter in the white matter of the right frontal lobe with a halo of edema, and a tiny dot of pneumocephalus, now measures 24 x 24 mm cross-section (image 22 series 2).    Mild hydrocephalus with biventricular diameter 36 mm, but overall unchanged from December 1 and significantly improved from 11/30.  Subarachnoid and intraventricular blood persists and is stable. No definite areas of acute infarction.  IMPRESSION: Increasing hemorrhage surrounding the parenchymal course of the right frontal catheter.  Dr. Corliss Skains aware. No change hydrocephalus or subarachnoid hemorrhage.  Original Report Authenticated By: Elsie Stain, M.D.   Ct Portable Head W/o Cm  10/09/2011  *RADIOLOGY REPORT*  Clinical Data: Decompressed of hydrocephalus.  CT HEAD WITHOUT CONTRAST  Technique:  Contiguous axial images were obtained from the base of the skull through the vertex without contrast.  Comparison: 10/08/2011.  Findings: Post basilar tip aneurysm coiling and stenting.  Subarachnoid hemorrhage and interventricular hemorrhage remains.  Right ventricular shunt is in place with the tip at the level of the right frontal horn. Blood is seen along the course of the catheter with surrounding vasogenic edema without significant change.  The degree of temporal horn prominence is unchanged without definitive findings of hydrocephalus.  No large acute thrombotic infarct.  No intracranial mass lesion noted on this unenhanced exam.  IMPRESSION: No significant change.  Please see above.  Original Report Authenticated By: Fuller Canada, M.D.    Assessment/Plan: Ventricular drain in place. CSF is blood tinged. Head CT shows small clot around catheter. Plavix d/ced.Aspirin continued. Continue with ventricular drainage.  LOS: 4 days     Annelie Boak L 10/09/2011, 5:11 PM

## 2011-10-09 NOTE — Progress Notes (Signed)
3 Days Post-Op  Subjective: HAs sig. Improved. Worsened by coughing. Able to tolerate clear liquids.. Denies new neuro symptoms. CT scan resolving ventricular and sah.. EVD csf clearing at 4 to 7 ml /hr.  Objective: Vital signs in last 24 hours: Temp:  [97.4 F (36.3 C)-98.5 F (36.9 C)] 98.5 F (36.9 C) (12/03 1200) Pulse Rate:  [43-61] 52  (12/03 1530) Resp:  [17-25] 17  (12/03 1530) BP: (129-178)/(49-86) 150/78 mmHg (12/03 1530) SpO2:  [93 %-97 %] 95 % (12/03 1530)    Intake/Output from previous day: 12/02 0701 - 12/03 0700 In: 2842.7 [P.O.:320; I.V.:2316.7; IV Piggyback:206] Out: 3340 [Urine:3060; Drains:280] Intake/Output this shift: Total I/O In: 1071.7 [I.V.:671.7; IV Piggyback:400] Out: 2097 [Urine:2050; Drains:47]  Alert,oriented X3. Speech  Normal. PERLA 3mm . EOMS full. VF full. No facial asymmetry. Tongue midline. Moor. No drift. Power.equal all 4s. Coordination fine motor  Equal UEs. P2Y12 234 approx 20 to 30 inhibition. Lab Results:  No results found for this basename: WBC:2,HGB:2,HCT:2,PLT:2 in the last 72 hours BMET  Basename 10/08/11 0500 10/07/11 1121  NA 138 144  K 3.4* 3.1*  CL 106 109  CO2 24 26  GLUCOSE 123* 142*  BUN 18 16  CREATININE 0.80 0.94  CALCIUM 8.6 8.8   PT/INR No results found for this basename: LABPROT:2,INR:2 in the last 72 hours ABG No results found for this basename: PHART:2,PCO2:2,PO2:2,HCO3:2 in the last 72 hours  Studies/Results: Ct Head Wo Contrast  10/08/2011  *RADIOLOGY REPORT*  Clinical Data: Follow-up aneurysm coiling.  CT HEAD WITHOUT CONTRAST  Technique:  Contiguous axial images were obtained from the base of the skull through the vertex without contrast.  Comparison: Multiple priors most recent 10/07/2011  Findings: Increasing hemorrhage around the shunt catheter in the white matter of the right frontal lobe with a halo of edema, and a tiny dot of pneumocephalus, now measures 24 x 24 mm cross-section (image  22 series 2).   Mild hydrocephalus with biventricular diameter 36 mm, but overall unchanged from December 1 and significantly improved from 11/30.  Subarachnoid and intraventricular blood persists and is stable. No definite areas of acute infarction.  IMPRESSION: Increasing hemorrhage surrounding the parenchymal course of the right frontal catheter.  Dr. Corliss Skains aware. No change hydrocephalus or subarachnoid hemorrhage.  Original Report Authenticated By: Elsie Stain, M.D.   Ct Portable Head W/o Cm  10/09/2011  *RADIOLOGY REPORT*  Clinical Data: Decompressed of hydrocephalus.  CT HEAD WITHOUT CONTRAST  Technique:  Contiguous axial images were obtained from the base of the skull through the vertex without contrast.  Comparison: 10/08/2011.  Findings: Post basilar tip aneurysm coiling and stenting.  Subarachnoid hemorrhage and interventricular hemorrhage remains.  Right ventricular shunt is in place with the tip at the level of the right frontal horn. Blood is seen along the course of the catheter with surrounding vasogenic edema without significant change.  The degree of temporal horn prominence is unchanged without definitive findings of hydrocephalus.  No large acute thrombotic infarct.  No intracranial mass lesion noted on this unenhanced exam.  IMPRESSION: No significant change.  Please see above.  Original Report Authenticated By: Fuller Canada, M.D.    Anti-infectives: Anti-infectives     Start     Dose/Rate Route Frequency Ordered Stop   10/06/11 2200   vancomycin (VANCOCIN) IVPB 1000 mg/200 mL premix     Comments: ceFAZolin (ANCEF) IVPB 1 g/50 mL premix (g) 1 g Given 10/06/11 1840       1,000 mg 200  mL/hr over 60 Minutes Intravenous Every 12 hours 10/06/11 2056     10/06/11 1804   ceFAZolin (ANCEF) 1-5 GM-% IVPB     Comments: MCMILLEN, MIKE: cabinet override         10/06/11 1804 10/07/11 0614   10/05/11 1600   vancomycin (VANCOCIN) IVPB 1000 mg/200 mL premix        1,000 mg 200  mL/hr over 60 Minutes Intravenous  Once 10/05/11 1414 10/05/11 1725   10/05/11 0945   vancomycin (VANCOCIN) IVPB 1000 mg/200 mL premix        1,000 mg 200 mL/hr over 60 Minutes Intravenous  Once 10/05/11 0930 10/05/11 1017          Assessment/Plan: s/p Procedure(s): VENTRICULOSTOMY 1. Advance to free liquids.Marland Kitchen 2.Continue as is.. 3. CT  brain in am..  LOS: 4 days    Conchetta Lamia K 10/09/2011

## 2011-10-10 ENCOUNTER — Inpatient Hospital Stay (HOSPITAL_COMMUNITY): Payer: Medicaid Other

## 2011-10-10 LAB — BASIC METABOLIC PANEL
BUN: 12 mg/dL (ref 6–23)
Calcium: 8.5 mg/dL (ref 8.4–10.5)
Chloride: 108 mEq/L (ref 96–112)

## 2011-10-10 LAB — CBC
Hemoglobin: 13.1 g/dL (ref 12.0–15.0)
MCH: 32.1 pg (ref 26.0–34.0)
MCV: 93.9 fL (ref 78.0–100.0)
RBC: 4.08 MIL/uL (ref 3.87–5.11)

## 2011-10-10 MED ORDER — POTASSIUM CHLORIDE CRYS ER 20 MEQ PO TBCR
40.0000 meq | EXTENDED_RELEASE_TABLET | Freq: Every day | ORAL | Status: DC
  Administered 2011-10-10 – 2011-10-22 (×13): 40 meq via ORAL
  Filled 2011-10-10 (×13): qty 2
  Filled 2011-10-10: qty 1
  Filled 2011-10-10: qty 2

## 2011-10-10 NOTE — Progress Notes (Signed)
4 Days Post-Op  Subjective: RT frontal HAs intermittent,especially with coughinh. Tolerating free liquid diet. Denies specific neurologic or systemic complaints. Labs K 3.0. WBC 11.4. Vent draining at 4 to 7 cc / hr of clearing blood tinged CSF.Marland Kitchen CT Brain. Mildly  prominent lat vents with no change in intraventricular bloodor SAH.  Objective: Vital signs in last 24 hours: Temp:  [97.7 F (36.5 C)-98.6 F (37 C)] 98.4 F (36.9 C) (12/04 0800) Pulse Rate:  [42-60] 46  (12/04 0900) Resp:  [17-25] 21  (12/04 0900) BP: (129-175)/(53-118) 159/71 mmHg (12/04 0900) SpO2:  [93 %-98 %] 95 % (12/04 0900) Weight:  [158 lb 1.1 oz (71.7 kg)] 158 lb 1.1 oz (71.7 kg) (12/03 1930)    Intake/Output from previous day: 12/03 0701 - 12/04 0700 In: 4221.7 [P.O.:1350; I.V.:2271.7; IV Piggyback:600] Out: 3983 [Urine:3850; Drains:133] Intake/Output this shift: Total I/O In: 100 [I.V.:100] Out: 6 [Drains:6]  ON Exam. Alert,Oriented X3. Speech N. PERLA 3mm. EOMS full. VF intact. No facial asymmetry. Tongue midline. Motor. Non focal. Coordination. And fine motor movements equal on finger to nose..   Lab Results:   Basename 10/10/11 0521  WBC 11.4*  HGB 13.1  HCT 38.3  PLT 219   BMET  Basename 10/10/11 0521 10/08/11 0500  NA 143 138  K 3.0* 3.4*  CL 108 106  CO2 26 24  GLUCOSE 121* 123*  BUN 12 18  CREATININE 0.80 0.80  CALCIUM 8.5 8.6   PT/INR No results found for this basename: LABPROT:2,INR:2 in the last 72 hours ABG No results found for this basename: PHART:2,PCO2:2,PO2:2,HCO3:2 in the last 72 hours  Studies/Results: Ct Head Wo Contrast  10/10/2011  *RADIOLOGY REPORT*  Clinical Data: Followup hydrocephalus.  CT HEAD WITHOUT CONTRAST  Technique:  Contiguous axial images were obtained from the base of the skull through the vertex without contrast.  Comparison: 10/09/2011.  Findings: Right frontal shunt catheter tip right frontal horn with blood/edema seen along the course  of the catheter.  The amount of intraventricular blood has increased.  Increase in size of the ventricles consistent with developing hydrocephalus.  Streak artifact from aneurysm coiling.  Prominent vascular calcifications.  Shifting of subarachnoid blood with slight increase in amount of subarachnoid blood right parietal region.  No CT evidence of large acute infarct.  Small acute infarct cannot be excluded by CT.  No intracranial mass lesion detected on this unenhanced exam.  Opacification right sphenoid sinus.  IMPRESSION: Increasing size of ventricles consistent with hydrocephalus.  Increased amount of blood within the dependent aspect of the ventricles.  Shifting of subarachnoid blood with slight increased amount of subarachnoid blood right parietal region.  Please see above.  Results called by Dr. Cherly Hensen to [name] patient's nurse 10/10/2011 6:15 a.m.  Original Report Authenticated By: Fuller Canada, M.D.   Ct Portable Head W/o Cm  10/09/2011  *RADIOLOGY REPORT*  Clinical Data: Decompressed of hydrocephalus.  CT HEAD WITHOUT CONTRAST  Technique:  Contiguous axial images were obtained from the base of the skull through the vertex without contrast.  Comparison: 10/08/2011.  Findings: Post basilar tip aneurysm coiling and stenting.  Subarachnoid hemorrhage and interventricular hemorrhage remains.  Right ventricular shunt is in place with the tip at the level of the right frontal horn. Blood is seen along the course of the catheter with surrounding vasogenic edema without significant change.  The degree of temporal horn prominence is unchanged without definitive findings of hydrocephalus.  No large acute thrombotic infarct.  No intracranial mass lesion noted  on this unenhanced exam.  IMPRESSION: No significant change.  Please see above.  Original Report Authenticated By: Fuller Canada, M.D.    Anti-infectives: Anti-infectives     Start     Dose/Rate Route Frequency Ordered Stop   10/06/11 2200    vancomycin (VANCOCIN) IVPB 1000 mg/200 mL premix     Comments: ceFAZolin (ANCEF) IVPB 1 g/50 mL premix (g) 1 g Given 10/06/11 1840       1,000 mg 200 mL/hr over 60 Minutes Intravenous Every 12 hours 10/06/11 2056     10/06/11 1804   ceFAZolin (ANCEF) 1-5 GM-% IVPB     Comments: MCMILLEN, MIKE: cabinet override         10/06/11 1804 10/07/11 0614   10/05/11 1600   vancomycin (VANCOCIN) IVPB 1000 mg/200 mL premix        1,000 mg 200 mL/hr over 60 Minutes Intravenous  Once 10/05/11 1414 10/05/11 1725   10/05/11 0945   vancomycin (VANCOCIN) IVPB 1000 mg/200 mL premix        1,000 mg 200 mL/hr over 60 Minutes Intravenous  Once 10/05/11 0930 10/05/11 1017          Assessment/Plan: s/p Procedure(s): VENTRICULOSTOMY !. EVD  readjusted per NES  2. Replace K. 3Advance diet as tolerated. 4. Social services consult. 5.Encourage deep breathing .Marland Kitchen  LOS: 5 days    Louanna Vanliew K 10/10/2011

## 2011-10-10 NOTE — Progress Notes (Signed)
Radiologist called with CT results/changes.  0700 relayed these changes to Dr. Gerlene Fee but call was disconnected mid conversation.  Pt complaining of h/a gave 1000 mg Tylenol around 0630.  IVC draining.

## 2011-10-10 NOTE — Progress Notes (Signed)
10  Kritzer called back and ordered to change the drain settings from 10 cm H2O to 5 cm H2O in response to CT changes.

## 2011-10-10 NOTE — Progress Notes (Signed)
Subjective: Patient reports headache with cough. feels better overall.   Objective: Vital signs in last 24 hours: Temp:  [97.7 F (36.5 C)-98.6 F (37 C)] 98.2 F (36.8 C) (12/04 1200) Pulse Rate:  [42-60] 48  (12/04 1300) Resp:  [17-25] 24  (12/04 1300) BP: (125-175)/(53-118) 143/65 mmHg (12/04 1300) SpO2:  [93 %-99 %] 95 % (12/04 1300) Weight:  [71.7 kg (158 lb 1.1 oz)] 158 lb 1.1 oz (71.7 kg) (12/03 1930)  Intake/Output from previous day: 12/03 0701 - 12/04 0700 In: 4221.7 [P.O.:1350; I.V.:2271.7; IV Piggyback:600] Out: 3983 [Urine:3850; Drains:133] Intake/Output this shift: Total I/O In: 940 [P.O.:240; I.V.:500; IV Piggyback:200] Out: 409 [Urine:350; Drains:59]  Neurologic: Alert and oriented X 3, normal strength and tone. Normal symmetric reflexes. Normal coordination and gait Pupils equal round, reactive to light Symmetric facies Tongue and uvula midline Lab Results:  [facility] 10/10/11 0521  WBC 11.4*  HGB 13.1  HCT 38.3  PLT 219   BMET  Basename 10/10/11 0521 10/08/11 0500  NA 143 138  K 3.0* 3.4*  CL 108 106  CO2 26 24  GLUCOSE 121* 123*  BUN 12 18  CREATININE 0.80 0.80  CALCIUM 8.5 8.6    Studies/Results: Ct Head Wo Contrast  10/10/2011  *RADIOLOGY REPORT*  Clinical Data: Followup hydrocephalus.  CT HEAD WITHOUT CONTRAST  Technique:  Contiguous axial images were obtained from the base of the skull through the vertex without contrast.  Comparison: 10/09/2011.  Findings: Right frontal shunt catheter tip right frontal horn with blood/edema seen along the course of the catheter.  The amount of intraventricular blood has increased.  Increase in size of the ventricles consistent with developing hydrocephalus.  Streak artifact from aneurysm coiling.  Prominent vascular calcifications.  Shifting of subarachnoid blood with slight increase in amount of subarachnoid blood right parietal region.  No CT evidence of large acute infarct.  Small acute infarct cannot be  excluded by CT.  No intracranial mass lesion detected on this unenhanced exam.  Opacification right sphenoid sinus.  IMPRESSION: Increasing size of ventricles consistent with hydrocephalus.  Increased amount of blood within the dependent aspect of the ventricles.  Shifting of subarachnoid blood with slight increased amount of subarachnoid blood right parietal region.  Please see above.  Results called by Dr. Cherly Hensen to [name] patient's nurse 10/10/2011 6:15 a.m.  Original Report Authenticated By: Fuller Canada, M.D.   Ct Portable Head W/o Cm  10/09/2011  *RADIOLOGY REPORT*  Clinical Data: Decompressed of hydrocephalus.  CT HEAD WITHOUT CONTRAST  Technique:  Contiguous axial images were obtained from the base of the skull through the vertex without contrast.  Comparison: 10/08/2011.  Findings: Post basilar tip aneurysm coiling and stenting.  Subarachnoid hemorrhage and interventricular hemorrhage remains.  Right ventricular shunt is in place with the tip at the level of the right frontal horn. Blood is seen along the course of the catheter with surrounding vasogenic edema without significant change.  The degree of temporal horn prominence is unchanged without definitive findings of hydrocephalus.  No large acute thrombotic infarct.  No intracranial mass lesion noted on this unenhanced exam.  IMPRESSION: No significant change.  Please see above.  Original Report Authenticated By: Fuller Canada, M.D.    Assessment/Plan: Continue ventricular drainage. Will drain at least 3 more days. May be able to remove this weekend.  LOS: 5 days     Jill [name] 10/10/2011, 1:39 PM

## 2011-10-10 NOTE — Progress Notes (Signed)
Stroke Team Progress Note  SUBJECTIVE Jill [name] is a 60 y.o. female whose stroke symptoms occurred 5 days ago following a basilar stent placement for coiling of basilar tip aneurysm.. She presented with symptoms of drowsiness and difficulties waking up post procedure. She does not have a stroke, but had IVH and hydrocephalus. IVC was placed. Increased IVH today with pop off decreased from 10 to 5.  Plavix was stopped yest. She remains on ASA. Overall she feels her condition is gradually improving.   OBJECTIVE Most recent Vital Signs: Temp: 98.6 F (37 C) (12/04 0430) Temp src: Oral (12/04 0430) BP: 154/66 mmHg (12/04 0730) Pulse Rate: 48  (12/04 0730) Respiratory Rate: 23 O2 Saturdation: 96%  CBG (last 3)   Basename 10/07/11 2231 10/07/11 1152  GLUCAP 115* 142*   Intake/Output from previous day: 12/03 0701 - 12/04 0700 In: 4221.7 [P.O.:1350; I.V.:2271.7; IV Piggyback:600] Out: 3983 [Urine:3850; Drains:133]  IV Fluid Intake     . sodium chloride 100 mL/hr at 10/10/11 0700   Diet  Full Liquid thin liquids  Activity  Bedrest  DVT Prophylaxis  SCDs   Studies Results for orders placed during the hospital encounter of 10/05/11 (from the past 24 hour(s))  DIFFERENTIAL     Status: Abnormal   Collection Time   10/09/11  4:48 PM      Component Value Range   Neutrophils Relative 83 (*) 43 - 77 (%)   Neutro Abs 10.5 (*) 1.7 - 7.7 (K/uL)   Lymphocytes Relative 10 (*) 12 - 46 (%)   Lymphs Abs 1.3  0.7 - 4.0 (K/uL)   Monocytes Relative 7  3 - 12 (%)   Monocytes Absolute 0.8  0.1 - 1.0 (K/uL)   Eosinophils Relative 0  0 - 5 (%)   Eosinophils Absolute 0.0  0.0 - 0.7 (K/uL)   Basophils Relative 0  0 - 1 (%)   Basophils Absolute 0.0  0.0 - 0.1 (K/uL)  BASIC METABOLIC PANEL     Status: Abnormal   Collection Time   10/10/11  5:21 AM      Component Value Range   Sodium 143  135 - 145 (mEq/L)   Potassium 3.0 (*) 3.5 - 5.1 (mEq/L)   Chloride 108  96 - 112 (mEq/L)   CO2 26  19 - 32  (mEq/L)   Glucose, Bld 121 (*) 70 - 99 (mg/dL)   BUN 12  6 - 23 (mg/dL)   Creatinine, Ser 1.61  0.50 - 1.10 (mg/dL)   Calcium 8.5  8.4 - 09.6 (mg/dL)   GFR calc non Af Amer 77 (*) >90 (mL/min)   GFR calc Af Amer 90 (*) >90 (mL/min)  CBC     Status: Abnormal   Collection Time   10/10/11  5:21 AM      Component Value Range   WBC 11.4 (*) 4.0 - 10.5 (K/uL)   RBC 4.08  3.87 - 5.11 (MIL/uL)   Hemoglobin 13.1  12.0 - 15.0 (g/dL)   HCT 04.5  40.9 - 81.1 (%)   MCV 93.9  78.0 - 100.0 (fL)   MCH 32.1  26.0 - 34.0 (pg)   MCHC 34.2  30.0 - 36.0 (g/dL)   RDW 91.4  78.2 - 95.6 (%)   Platelets 219  150 - 400 (K/uL)    Ct Head Wo Contrast  10/10/2011  Increasing size of ventricles consistent with hydrocephalus.  Increased amount of blood within the dependent aspect of the ventricles.  Shifting of subarachnoid blood  with slight increased amount of subarachnoid blood right parietal region.    Physical Exam  Pleasant middle-aged Caucasian lady currently not in distress. Afebrile. Head is not dramatic except for when the colostomy catheter. Neck is supple without bruit. Cardiac exam no murmur or gallop. Lungs clear to auscultation.  Neurological exam awake alert oriented to place and person. Diminished attention and recall. Speech and language appear normal. Eye movements are full range without nystagmus. Face is symmetric without weakness. Tongue is midline. Motor system exam reveals symmetric and equal strength in all 4 extremities. Deep tendonre flexes 2+ symmetric plantars are downgoing.  ASSESSMENT Jill [name] is a 60 y.o. female with a small intraventricular hemorrhage following an elective coiling of asymptomatic basilar tip aneurysm with mild hydrocephalus s/p IVC placement with significant clinical improvement. IVC remains in place. She does not have a stroke, but had IVH and hydrocephalus. IVC was placed. Increased IVH today with popoff decreased from 10 to 5.  Plavix was stopped yest. She  remains on ASA.  Stroke risk factors: hyperlipidemia, hypertension, smoking and CAD  Hospital day # 5  TREATMENT/PLAN Continue ASA. Increase diet to heart healthy. Continue IVC wean as tolerated. Once IVC out, will need PT/OT evals. Nothing further to add. D/W daughter and answered questions. Will sign off. Follow up with Dr. Pearlean Brownie in 2 months.   Joaquin Music, ANP-BC, GNP-BC [facility] Pager: [phone] 10/10/2011 8:14 AM  Dr. Delia Heady, Stroke Center Medical Director, has personally reviewed chart, pertinent data, examined the patient and developed the plan of care.

## 2011-10-10 NOTE — Progress Notes (Signed)
Subjective: Patient reports headAche when coughing  Objective: Vital signs in last 24 hours: Temp:  [97.7 F (36.5 C)-98.6 F (37 C)] 98.4 F (36.9 C) (12/04 0800) Pulse Rate:  [42-60] 48  (12/04 1300) Resp:  [17-25] 24  (12/04 1300) BP: (125-175)/(53-118) 143/65 mmHg (12/04 1300) SpO2:  [93 %-99 %] 95 % (12/04 1300) Weight:  [71.7 kg (158 lb 1.1 oz)] 158 lb 1.1 oz (71.7 kg) (12/03 1930)  Intake/Output from previous day: 12/03 0701 - 12/04 0700 In: 4221.7 [P.O.:1350; I.V.:2271.7; IV Piggyback:600] Out: 3983 [Urine:3850; Drains:133] Intake/Output this shift: Total I/O In: 940 [P.O.:240; I.V.:500; IV Piggyback:200] Out: 409 [Urine:350; Drains:59]  Neurologic: Alert and oriented X 3, normal strength and tone. Normal symmetric reflexes. Normal coordination and gait Cranial nerves: II: pupils equal, round, reactive to light and accommodation, III,IV,VI: extraocular muscles extra-ocular motions intact, V: mastication normal, VII: upper facial muscle function normal bilaterally, VII: lower facial muscle function normal bilaterally, VIII: hearing intact to voice, IX: soft palate elevation normal bilaterally, XI: trapezius strength normal bilaterally, XII: tongue strength normal   Lab Results:  Basename 10/10/11 0521  WBC 11.4*  HGB 13.1  HCT 38.3  PLT 219   BMET  Basename 10/10/11 0521 10/08/11 0500  NA 143 138  K 3.0* 3.4*  CL 108 106  CO2 26 24  GLUCOSE 121* 123*  BUN 12 18  CREATININE 0.80 0.80  CALCIUM 8.5 8.6    Studies/Results: Ct Head Wo Contrast  10/10/2011  *RADIOLOGY REPORT*  Clinical Data: Followup hydrocephalus.  CT HEAD WITHOUT CONTRAST  Technique:  Contiguous axial images were obtained from the base of the skull through the vertex without contrast.  Comparison: 10/09/2011.  Findings: Right frontal shunt catheter tip right frontal horn with blood/edema seen along the course of the catheter.  The amount of intraventricular blood has increased.  Increase in size  of the ventricles consistent with developing hydrocephalus.  Streak artifact from aneurysm coiling.  Prominent vascular calcifications.  Shifting of subarachnoid blood with slight increase in amount of subarachnoid blood right parietal region.  No CT evidence of large acute infarct.  Small acute infarct cannot be excluded by CT.  No intracranial mass lesion detected on this unenhanced exam.  Opacification right sphenoid sinus.  IMPRESSION: Increasing size of ventricles consistent with hydrocephalus.  Increased amount of blood within the dependent aspect of the ventricles.  Shifting of subarachnoid blood with slight increased amount of subarachnoid blood right parietal region.  Please see above.  Results called by Dr. Cherly Hensen to [name] patient's nurse 10/10/2011 6:15 a.m.  Original Report Authenticated By: Fuller Canada, M.D.   Ct Portable Head W/o Cm  10/09/2011  *RADIOLOGY REPORT*  Clinical Data: Decompressed of hydrocephalus.  CT HEAD WITHOUT CONTRAST  Technique:  Contiguous axial images were obtained from the base of the skull through the vertex without contrast.  Comparison: 10/08/2011.  Findings: Post basilar tip aneurysm coiling and stenting.  Subarachnoid hemorrhage and interventricular hemorrhage remains.  Right ventricular shunt is in place with the tip at the level of the right frontal horn. Blood is seen along the course of the catheter with surrounding vasogenic edema without significant change.  The degree of temporal horn prominence is unchanged without definitive findings of hydrocephalus.  No large acute thrombotic infarct.  No intracranial mass lesion noted on this unenhanced exam.  IMPRESSION: No significant change.  Please see above.  Original Report Authenticated By: Fuller Canada, M.D.    Assessment/Plan: Ventricular drain working well. Will continue  to drain since csf still blood tinged/  LOS: 5 days     Jermall Isaacson L 10/10/2011, 1:14 PM

## 2011-10-11 ENCOUNTER — Encounter (HOSPITAL_COMMUNITY): Payer: Self-pay | Admitting: Neurosurgery

## 2011-10-11 LAB — CBC
HCT: 43.5 % (ref 36.0–46.0)
MCH: 32 pg (ref 26.0–34.0)
MCHC: 33.8 g/dL (ref 30.0–36.0)
MCV: 94.6 fL (ref 78.0–100.0)
RDW: 13 % (ref 11.5–15.5)

## 2011-10-11 LAB — DIFFERENTIAL
Basophils Absolute: 0 10*3/uL (ref 0.0–0.1)
Basophils Relative: 0 % (ref 0–1)
Eosinophils Relative: 0 % (ref 0–5)
Monocytes Absolute: 0.7 10*3/uL (ref 0.1–1.0)

## 2011-10-11 NOTE — Progress Notes (Signed)
5 Days Post-Op  Subjective: Basilar artery Aneurysm stent/coil by Dr Jill [name] 11/29 Ventricular bleed post op and venticulostomy placed by Dr Jill [name] 11/30 Pt improving daily. H/[name] over Rt side of head Denies N/V Eating and drinking well. No other complaints Objective: Vital signs in last 24 hours: Temp:  [97.7 F (36.5 C)-98.6 F (37 C)] 97.9 F (36.6 C) (12/05 0352) Pulse Rate:  [42-63] 47  (12/05 0730) Resp:  [15-25] 15  (12/05 0730) BP: (125-181)/(56-122) 168/66 mmHg (12/05 0730) SpO2:  [92 %-99 %] 95 % (12/05 0730) Weight:  [158 lb 8.2 oz (71.9 kg)] 158 lb 8.2 oz (71.9 kg) (12/04 1930)    Intake/Output from previous day: 12/04 0701 - 12/05 0700 In: 2985 [P.O.:500; I.V.:2100; IV Piggyback:385] Out: 3347 [Urine:3100; Drains:247] Intake/Output this shift:    PE:  VSS;  160s/60s Alert; follows all commands Good strength bilat Upper and lower extr Neuro intact Face symmetrical; smile = ventric has approx 50 cc in bag: blood tinged Site clean and dry   Lab Results:   Basename 10/10/11 0521  WBC 11.4*  HGB 13.1  HCT 38.3  PLT 219   BMET  Basename 10/10/11 0521  NA 143  K 3.0*  CL 108  CO2 26  GLUCOSE 121*  BUN 12  CREATININE 0.80  CALCIUM 8.5   PT/INR No results found for this basename: LABPROT:2,INR:2 in the last 72 hours ABG No results found for this basename: PHART:2,PCO2:2,PO2:2,HCO3:2 in the last 72 hours  Studies/Results: Ct Head Wo Contrast  10/10/2011  *RADIOLOGY REPORT*  Clinical Data: Followup hydrocephalus.  CT HEAD WITHOUT CONTRAST  Technique:  Contiguous axial images were obtained from the base of the skull through the vertex without contrast.  Comparison: 10/09/2011.  Findings: Right frontal shunt catheter tip right frontal horn with blood/edema seen along the course of the catheter.  The amount of intraventricular blood has increased.  Increase in size of the ventricles consistent with developing hydrocephalus.  Streak artifact from  aneurysm coiling.  Prominent vascular calcifications.  Shifting of subarachnoid blood with slight increase in amount of subarachnoid blood right parietal region.  No CT evidence of large acute infarct.  Small acute infarct cannot be excluded by CT.  No intracranial mass lesion detected on this unenhanced exam.  Opacification right sphenoid sinus.  IMPRESSION: Increasing size of ventricles consistent with hydrocephalus.  Increased amount of blood within the dependent aspect of the ventricles.  Shifting of subarachnoid blood with slight increased amount of subarachnoid blood right parietal region.  Please see above.  Results called by Dr. Cherly [name] to Jill [name] patient's nurse 10/10/2011 6:15 [name].m.  Original Report Authenticated By: Jill [name], M.D.    Anti-infectives: Anti-infectives     Start     Dose/Rate Route Frequency Ordered Stop   10/06/11 2200   vancomycin (VANCOCIN) IVPB 1000 mg/200 mL premix     Comments: ceFAZolin (ANCEF) IVPB 1 g/50 mL premix (g) 1 g Given 10/06/11 1840       1,000 mg 200 mL/hr over 60 Minutes Intravenous Every 12 hours 10/06/11 2056     10/06/11 1804   ceFAZolin (ANCEF) 1-5 GM-% IVPB     Comments: [name], Jill: cabinet override         10/06/11 1804 10/07/11 0614   10/05/11 1600   vancomycin (VANCOCIN) IVPB 1000 mg/200 mL premix        1,000 mg 200 mL/hr over 60 Minutes Intravenous  Once 10/05/11 1414 10/05/11 1725   10/05/11 0945   vancomycin (VANCOCIN)  IVPB 1000 mg/200 mL premix        1,000 mg 200 mL/hr over 60 Minutes Intravenous  Once 10/05/11 0930 10/05/11 1017          Assessment/Plan: s/p Procedure(s): VENTRICULOSTOMY Basilar Artery aneurysm stent/coil 11/29 Post op ventricular bleed Ventriculostomy 11/30 Continued headache--will discuss with Dr Jill [name] Status of pt and treatment.    Jill [name] 10/11/2011

## 2011-10-11 NOTE — Progress Notes (Signed)
Utilization review completed. Caleesi Kohl, RN, BSN. 10/11/11  

## 2011-10-11 NOTE — Progress Notes (Signed)
Subjective: Patient reports continued headache. Boredom  Objective: Vital signs in last 24 hours: Temp:  [97.7 F (36.5 C)-98.6 F (37 C)] 97.9 F (36.6 C) (12/05 0352) Pulse Rate:  [42-63] 56  (12/05 1400) Resp:  [15-25] 16  (12/05 1400) BP: (132-181)/(59-122) 137/62 mmHg (12/05 1400) SpO2:  [92 %-98 %] 95 % (12/05 1400) Weight:  [71.9 kg (158 lb 8.2 oz)] 158 lb 8.2 oz (71.9 kg) (12/04 1930)  Intake/Output from previous day: 12/04 0701 - 12/05 0700 In: 2985 [P.O.:500; I.V.:2100; IV Piggyback:385] Out: 3347 [Urine:3100; Drains:247] Intake/Output this shift: Total I/O In: 1380 [P.O.:480; I.V.:700; IV Piggyback:200] Out: 854 [Urine:800; Drains:54]  Neurologic: Mental status: Alert, oriented, thought content appropriate Cranial nerves: II: visual field normal, II: pupils equal, round, reactive to light and accommodation, VII: upper facial muscle function normal bilaterally, VII: lower facial muscle function normal bilaterally, VIII: hearing normal, IX: soft palate elevation normal bilaterally, XI: trapezius strength normal bilaterally, XII: tongue strength normal  Motor: Normal  Lab Results:  Basename 10/11/11 0941 10/10/11 0521  WBC 13.8* 11.4*  HGB 14.7 13.1  HCT 43.5 38.3  PLT 262 219   BMET  Basename 10/10/11 0521  NA 143  K 3.0*  CL 108  CO2 26  GLUCOSE 121*  BUN 12  CREATININE 0.80  CALCIUM 8.5    Studies/Results: Ct Head Wo Contrast  10/10/2011  *RADIOLOGY REPORT*  Clinical Data: Followup hydrocephalus.  CT HEAD WITHOUT CONTRAST  Technique:  Contiguous axial images were obtained from the base of the skull through the vertex without contrast.  Comparison: 10/09/2011.  Findings: Right frontal shunt catheter tip right frontal horn with blood/edema seen along the course of the catheter.  The amount of intraventricular blood has increased.  Increase in size of the ventricles consistent with developing hydrocephalus.  Streak artifact from aneurysm coiling.  Prominent  vascular calcifications.  Shifting of subarachnoid blood with slight increase in amount of subarachnoid blood right parietal region.  No CT evidence of large acute infarct.  Small acute infarct cannot be excluded by CT.  No intracranial mass lesion detected on this unenhanced exam.  Opacification right sphenoid sinus.  IMPRESSION: Increasing size of ventricles consistent with hydrocephalus.  Increased amount of blood within the dependent aspect of the ventricles.  Shifting of subarachnoid blood with slight increased amount of subarachnoid blood right parietal region.  Please see above.  Results called by Dr. Cherly Hensen to [name] patient's nurse 10/10/2011 6:15 a.m.  Original Report Authenticated By: Fuller Canada, M.D.    Assessment/Plan: Continue drainage. Repeat CT Friday. Ventricles remain enlarged, with blood in occipital horns. Neuro exam is good.   LOS: 6 days     Eavan Gonterman L 10/11/2011, 3:10 PM

## 2011-10-11 NOTE — Progress Notes (Signed)
0300 Pt tried to get out of bed to use the restroom.  Complained of severe headache despite PRN Tylenol given after midnight.  BP 161/67.  Appeared slightly confused, oriented to person, situation, year but not place. She had just woken up so maybe just groggy.  When I asked her again several minutes later, she was A/O x 4 and knew her MD's name and type of procedure.  IVC still set at 5 cm H2O with consistent serosanguinous output.  Pain has been her biggest complaint throughout the shift.

## 2011-10-11 NOTE — Progress Notes (Signed)
Clinical Social Worker completed the psychosocial assessment, which can be found in the shadow chart. The other psychosocial consult was regarding financial concerns. Mrs. Jill [name] does not have health insurance, but applied for Medicaid in [facility] prior to coming into the hospital, and her application is currently pending. When asked if she had applied for SSA disability, the patient responded no. CSW made contact with Jill [name] in financial counseling and she will visit with patient and determine if they can assist with the disability application. CSW signing off as no other SW needs.  Genelle Bal, MSW, LCSW [phone]

## 2011-10-12 ENCOUNTER — Inpatient Hospital Stay (HOSPITAL_COMMUNITY): Payer: Medicaid Other

## 2011-10-12 ENCOUNTER — Encounter (HOSPITAL_COMMUNITY): Payer: Self-pay

## 2011-10-12 LAB — CBC
MCHC: 33.8 g/dL (ref 30.0–36.0)
MCV: 94.3 fL (ref 78.0–100.0)
Platelets: 243 10*3/uL (ref 150–400)
RDW: 13.4 % (ref 11.5–15.5)
WBC: 12.2 10*3/uL — ABNORMAL HIGH (ref 4.0–10.5)

## 2011-10-12 LAB — BASIC METABOLIC PANEL
Calcium: 8.6 mg/dL (ref 8.4–10.5)
Creatinine, Ser: 0.82 mg/dL (ref 0.50–1.10)
GFR calc Af Amer: 87 mL/min — ABNORMAL LOW (ref >90)

## 2011-10-12 NOTE — Progress Notes (Signed)
Subjective: Patient reports Headache  Objective: Vital signs in last 24 hours: Temp:  [97.5 F (36.4 C)-98.5 F (36.9 C)] 98.2 F (36.8 C) (12/06 1200) Pulse Rate:  [48-58] 50  (12/06 1400) Resp:  [15-35] 22  (12/06 1400) BP: (121-183)/(58-102) 132/102 mmHg (12/06 1400) SpO2:  [91 %-96 %] 96 % (12/06 1400) Weight:  [72.4 kg (159 lb 9.8 oz)] 159 lb 9.8 oz (72.4 kg) (12/05 2000)  Intake/Output from previous day: 12/05 0701 - 12/06 0700 In: 3060 [P.O.:720; I.V.:1940; IV Piggyback:400] Out: 3774 [Urine:3550; Drains:224] Intake/Output this shift: Total I/O In: 820 [P.O.:240; I.V.:380; IV Piggyback:200] Out: 1573 [Urine:1450; Drains:123]  Neurologic: Mental status: alertness: alert, orientation: time, date, person, place Cranial nerves: II: pupils equal, round, reactive to light and accommodation, III,IV,VI: extraocular muscles extra-ocular motions intact, VII: upper facial muscle function normal bilaterally, VII: lower facial muscle function normal bilaterally, IX: soft palate elevation normal bilaterally, XI: trapezius strength normal bilaterally, XII: tongue strength normal  Motor: Normal  Lab Results:  Basename 10/12/11 0355 10/11/11 0941  WBC 12.2* 13.8*  HGB 13.9 14.7  HCT 41.1 43.5  PLT 243 262   BMET  Basename 10/12/11 0355 10/10/11 0521  NA 139 143  K 3.8 3.0*  CL 107 108  CO2 23 26  GLUCOSE 131* 121*  BUN 16 12  CREATININE 0.82 0.80  CALCIUM 8.6 8.5    Studies/Results: Ct Head Wo Contrast  10/12/2011  *RADIOLOGY REPORT*  Clinical Data: Follow-up intraventricular hemorrhage.  CT HEAD WITHOUT CONTRAST  Technique:  Contiguous axial images were obtained from the base of the skull through the vertex without contrast.  Comparison: Multiple prior studies since 09/08/2011.  The most recent is a CT head without contrast 10/10/2011.  Findings: The intraventricular hemorrhage is again noted.  The right ventriculostomy catheters stable position.  Parenchymal hemorrhage  about the catheter tract is stable.  There is some surrounding vasogenic edema.  The ventricular size is stable to slightly decreased relative to the previous study.  The temporal tips are slightly less prominent.  Basilar tip aneurysm coiling is again noted. No new hemorrhage is present.  The left basilar PCA stent is again noted as well. Minimal subarachnoid blood is stable.  IMPRESSION:  1.  Stable to slight decrease in ventricular enlargement since the prior exam. 2.  Stable appearance of subarachnoid blood. 3.  Stable intraventricular hemorrhage. 4.  Status post stent assisted aneurysm coiling of the basilar tip aneurysm.  Original Report Authenticated By: Jamesetta Orleans. MATTERN, M.D.    Assessment/Plan: Check CT tomorrow. If  Ventricles look better raise ventricular catheter. Stable exam. Doing well overall.  LOS: 7 days     Tymeka Privette L 10/12/2011, 2:47 PM

## 2011-10-12 NOTE — Progress Notes (Signed)
6 Days Post-Op  Subjective: Basilar Artery Aneurysm stent/coil 11/29 with Dr Corliss Skains. Ventricular bleed and ventriculostomy 11/30 with Dr Franky Macho. Pt improving every day. H/A only "1" on pain scale. Still rt side of head. No N/V. Eating and sleeping well.   Objective: Vital signs in last 24 hours: Temp:  [97.5 F (36.4 C)-98.5 F (36.9 C)] 98.5 F (36.9 C) (12/05 1938) Pulse Rate:  [48-60] 52  (12/06 0800) Resp:  [15-29] 17  (12/06 0800) BP: (121-183)/(58-97) 168/66 mmHg (12/06 0800) SpO2:  [91 %-97 %] 94 % (12/06 0800) Weight:  [159 lb 9.8 oz (72.4 kg)] 159 lb 9.8 oz (72.4 kg) (12/05 2000)    Intake/Output from previous day: 12/05 0701 - 12/06 0700 In: 3060 [P.O.:720; I.V.:1940; IV Piggyback:400] Out: 3774 [Urine:3550; Drains:224] Intake/Output this shift: Total I/O In: 160 [I.V.:160] Out: 624 [Urine:600; Drains:24]  PE:  Alert and orientated Moves all 4s without weakness Bilat Face symmetrical; smile =; puffs cheeks = Ventric output 125 cc 12/5; 25 cc today; blood tinged   Lab Results:   Basename 10/12/11 0355 10/11/11 0941  WBC 12.2* 13.8*  HGB 13.9 14.7  HCT 41.1 43.5  PLT 243 262   BMET  Basename 10/12/11 0355 10/10/11 0521  NA 139 143  K 3.8 3.0*  CL 107 108  CO2 23 26  GLUCOSE 131* 121*  BUN 16 12  CREATININE 0.82 0.80  CALCIUM 8.6 8.5   PT/INR No results found for this basename: LABPROT:2,INR:2 in the last 72 hours ABG No results found for this basename: PHART:2,PCO2:2,PO2:2,HCO3:2 in the last 72 hours  Studies/Results: No results found.  Anti-infectives: Anti-infectives     Start     Dose/Rate Route Frequency Ordered Stop   10/06/11 2200   vancomycin (VANCOCIN) IVPB 1000 mg/200 mL premix     Comments: ceFAZolin (ANCEF) IVPB 1 g/50 mL premix (g) 1 g Given 10/06/11 1840       1,000 mg 200 mL/hr over 60 Minutes Intravenous Every 12 hours 10/06/11 2056     10/06/11 1804   ceFAZolin (ANCEF) 1-5 GM-% IVPB     Comments: MCMILLEN, MIKE:  cabinet override         10/06/11 1804 10/07/11 0614   10/05/11 1600   vancomycin (VANCOCIN) IVPB 1000 mg/200 mL premix        1,000 mg 200 mL/hr over 60 Minutes Intravenous  Once 10/05/11 1414 10/05/11 1725   10/05/11 0945   vancomycin (VANCOCIN) IVPB 1000 mg/200 mL premix        1,000 mg 200 mL/hr over 60 Minutes Intravenous  Once 10/05/11 0930 10/05/11 1017          Assessment/Plan: s/p Procedure(s): VENTRICULOSTOMY  Basilar artery aneurysm stent/coil. Intraventricular bleed--ventriculostomy. Pt improving daily H/A slight. Will report to Dr Corliss Skains.   Keta [place]kenburgh A 10/12/2011

## 2011-10-13 ENCOUNTER — Inpatient Hospital Stay (HOSPITAL_COMMUNITY): Payer: Medicaid Other

## 2011-10-13 ENCOUNTER — Other Ambulatory Visit: Payer: Self-pay

## 2011-10-13 DIAGNOSIS — R739 Hyperglycemia, unspecified: Secondary | ICD-10-CM | POA: Diagnosis not present

## 2011-10-13 LAB — CARDIAC PANEL(CRET KIN+CKTOT+MB+TROPI): Troponin I: <0.3 ng/mL (ref <0.30)

## 2011-10-13 MED ORDER — ALUM & MAG HYDROXIDE-SIMETH 200-200-20 MG/5ML PO SUSP
30.0000 mL | Freq: Four times a day (QID) | ORAL | Status: DC | PRN
  Administered 2011-10-13 – 2011-10-17 (×7): 30 mL via ORAL
  Filled 2011-10-13 (×7): qty 30

## 2011-10-13 MED ORDER — AMLODIPINE BESYLATE 5 MG PO TABS
5.0000 mg | ORAL_TABLET | Freq: Every day | ORAL | Status: DC
  Administered 2011-10-13: 5 mg via ORAL
  Filled 2011-10-13 (×2): qty 1

## 2011-10-13 MED ORDER — NICARDIPINE HCL IN NACL 20-0.86 MG/200ML-% IV SOLN
5.0000 mg/h | INTRAVENOUS | Status: DC
  Administered 2011-10-13 (×2): 5 mg/h via INTRAVENOUS
  Administered 2011-10-14 (×2): 11 mg/h via INTRAVENOUS
  Administered 2011-10-14: 12 mg/h via INTRAVENOUS
  Administered 2011-10-14 (×2): 12.5 mg/h via INTRAVENOUS
  Administered 2011-10-14: 10 mg/h via INTRAVENOUS
  Administered 2011-10-14: 5 mg/h via INTRAVENOUS
  Administered 2011-10-14: 8 mg/h via INTRAVENOUS
  Administered 2011-10-14: 12 mg/h via INTRAVENOUS
  Administered 2011-10-14: 10 mg/h via INTRAVENOUS
  Administered 2011-10-14: 12.5 mg/h via INTRAVENOUS
  Administered 2011-10-15: 15 mg/h via INTRAVENOUS
  Administered 2011-10-15: 5 mg/h via INTRAVENOUS
  Administered 2011-10-15: 15 mg/h via INTRAVENOUS
  Administered 2011-10-15: 10 mg/h via INTRAVENOUS
  Administered 2011-10-15: 15 mg/h via INTRAVENOUS
  Filled 2011-10-13 (×19): qty 200

## 2011-10-13 NOTE — Progress Notes (Signed)
Patient appeared to have a widened QRS complex with some ST elevation. Notified Dr. Corliss Skains. Received orders to get cardiac enzymes and a 12 lead EKG. 12 lead EKG showed left bundle branch block.

## 2011-10-13 NOTE — Progress Notes (Signed)
Stat CT scan completed.  Beckey Downing PA and Dr. Titus Dubin notfied and viewed patient down in CT scan.  Order given by Dr. Beatrix Shipper to give 1.25 enalapril iv x1 but upon arrival to room BP: is 133/65.  Beckey Downing PA notified and order to not give BP med.  Beckey Downing also notified to please touch base with internal medicine to help maintain BP.  [name]

## 2011-10-13 NOTE — Progress Notes (Signed)
830: Pt. Has been c/o headache "behind right eye" since this AM around 830.  BP was slightly elevated throughout morning.  Hydralazine 5mg  given by previous nurse around 4 am with not much significant improvement of BP.   1000: Scheduled BP medications given except for beta blocker for HR in the 50's.  Hydralazine 5mg  iv push given at 1030am for BP >160.  IVC drain continually draining 14-17 cc/hr which is slightly elevated since previous 12hr readings.  Tylenol 1000mg  extra strength po given with no alleviation of headache.  Beckey Downing PA notified of ongoing headache and stated that message would be given to Dr. Titus Dubin  1045: Patient still c/o headache, photophobia, and nuchal rigidity.  Patient complains of neck stiffness upon perform chin to chest movemnt.  Beckey Downing PA notified of this occurrence and stated that Dr. Titus Dubin would be notified.    Awaiting for MD return consult and will cont. To monitor patient.    [name]

## 2011-10-13 NOTE — Consult Note (Addendum)
Jill [name] is an 60 y.o. female.    PCP: Elyn Aquas., MD, MD    Reason for Consult: Management of high blood pressure and high blood sugars Referring Physician: Dr. Corliss Skains   Chief Complaint: Headache  HPI: This is a 60 year old, Caucasian female, with a past medical history of CAD, hypertension, hypothyroidism, who had a single, presyncopal event, and presented to the emergency department. 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 She had been complaining of headaches prior to that. So, the patient underwent angiogram and coiling of this aneurysm and a stent placement. This was done on November 29.  This procedure was complicated by intraventricular hemorrhage. Patient required neurosurgical intervention and intraventricular catheter placement. This has been draining a bloody drainage. Patient has been monitored in the neurological intensive care unit for the last many days.  Patient has been having headaches for the past few days, which has been evaluated with serial CAT scans. Neurosurgery, neurology, and Dr. Corliss Skains has been following the patient on daily basis. We were called today because of blood pressure issues. We were also asked to manage her hyperglycemia.  Patient at this time is complaining of headache, mostly involving the right side. She denies any focal weakness. She denies any chest pain or shortness of breath at this time. Denies any nausea, vomiting.   Prior to Admission medications   Medication Sig Start Date End Date Taking? Authorizing Provider  ALPRAZolam Prudy Feeler) 0.5 MG tablet Take 0.5 mg by mouth at bedtime as needed. For anxiety  06/21/11  Yes Delbert Harness, MD  aspirin 81 MG tablet Take 81 mg by mouth daily.     Yes Historical Provider, MD  clopidogrel (PLAVIX) 75 MG tablet Take 75 mg by mouth daily.    Yes Historical Provider, MD  hydrochlorothiazide 25 MG tablet Take 1 tablet (25 mg total) by mouth daily. 03/23/11  Yes Elyn Aquas., MD  levothyroxine (SYNTHROID, LEVOTHROID) 100 MCG tablet Take 1 tablet (100 mcg total) by mouth daily. 06/21/11  Yes Delbert Harness, MD  losartan (COZAAR) 100 MG tablet Take 1 tablet (100 mg total) by mouth daily. 03/23/11  Yes Elyn Aquas., MD  metoprolol (LOPRESSOR) 50 MG tablet Take 50 mg by mouth daily.     Yes Historical Provider, MD  nitroGLYCERIN (NITROSTAT) 0.4 MG SL tablet Place 1 tablet (0.4 mg total) under the tongue every 5 (five) minutes as needed for chest pain. 03/23/11 03/22/12 Yes Elyn Aquas., MD  PARoxetine (PAXIL) 40 MG tablet Take 1 tablet (40 mg total) by mouth every morning. 06/21/11 06/20/12 Yes Delbert Harness, MD  simvastatin (ZOCOR) 80 MG tablet Take 0.5 tablets (40 mg total) by mouth at bedtime. 03/23/11  Yes Elyn Aquas., MD  omeprazole (PRILOSEC) 40 MG capsule Take 40 mg by mouth daily.      Historical Provider, MD    Allergies:  Allergies  Allergen Reactions  . Ace Inhibitors Cough  . Penicillins   . Sulfa Drugs Cross Reactors Rash    Past Medical History  Diagnosis Date  . Coronary artery disease     Prior inferior MI with stent to RCA, s/p CABG in 2008  . Hyperlipidemia   . Hypertension   . Fatigue   . Dizziness   . Hypothyroidism   . Normal nuclear stress test Ju;y 2012    No ischemia. EF 70%; fixed defect involving septum, inferoseptal and inferior wall  . Tobacco abuse   .  Retroperitoneal bleeding     Following cardiac cath  . GERD (gastroesophageal reflux disease)   . Headache     UNRUPTURED CEREBRAL ANEURYSM  . Myocardial infarction   . Blood transfusion   . Anxiety     ON Reino Kent    Past Surgical History  Procedure Date  . Cardiac catheterization 01/02/2007    IT REVEALS MILD INFERIOR WALL HYPOKINESIS. THE EJECTION FRACTION IS AROUND 50%  . Coronary artery bypass graft 2008    LIMA to LAD, SVG to DX, SVG to LCX & SVG to OM 1 & 2, and SVG to PD  . Coronary stent placement     Remote past stent to RCA  . Hemorrhoid  surgery 1989  . Ventriculostomy 10/06/2011    Procedure: VENTRICULOSTOMY;  Surgeon: Carmela Hurt;  Location: [facility];  Service: Neurosurgery;  Laterality: Right;  Insertion of Ventriculostomy Catheter    Social History:  reports that she has been smoking Cigarettes.  She has a 13.25 pack-year smoking history. She does not have any smokeless tobacco history on file. She reports that she does not drink alcohol or use illicit drugs.  Family History:  Family History  Problem Relation Age of Onset  . Heart disease Neg Hx   . Cancer Mother 58  . Diabetes Mother   . Alcohol abuse Father   . Asthma Father   . Diabetes Brother   . Cancer Brother     lung cancer  . Cancer Maternal Aunt     ovarian ca  . Cancer Paternal Aunt     liver cancer    Review of Systems - History obtained from the patient General ROS: negative Ophthalmic ROS: negative ENT ROS: negative Allergy and Immunology ROS: negative Hematological and Lymphatic ROS: negative Respiratory ROS: no cough, shortness of breath, or wheezing Cardiovascular ROS: no chest pain or dyspnea on exertion Gastrointestinal ROS: no abdominal pain, change in bowel habits, or black or bloody stools Genito-Urinary ROS: no dysuria, trouble voiding, or hematuria Musculoskeletal ROS: negative Neurological ROS: positive for - headaches Dermatological ROS: negative  Physical Examination Blood pressure 163/71, pulse 54, temperature 98.3 F (36.8 C), temperature source Oral, resp. rate 19, height 5\' 7"  (1.702 m), weight 66.8 kg (147 lb 4.3 oz), SpO2 96.00%.  General appearance: alert, cooperative, appears stated age and no distress Head: Catheter noted from skull Eyes: Injected conjunctiva Throat: lips, mucosa, and tongue normal; teeth and gums normal Resp: clear to auscultation bilaterally Cardio: regular rate and rhythm, S1, S2 normal, no murmur, click, rub or gallop GI: soft, non-tender; bowel sounds normal; no masses,  no  organomegaly Extremities: extremities normal, atraumatic, no cyanosis or edema Pulses: 2+ and symmetric Skin: Skin color, texture, turgor normal. No rashes or lesions Lymph nodes: Cervical, supraclavicular, and axillary nodes normal. Neurologic: Grossly normal  Results for orders placed during the hospital encounter of 10/05/11 (from the past 48 hour(s))  BASIC METABOLIC PANEL     Status: Abnormal   Collection Time   10/12/11  3:55 AM      Component Value Range Comment   Sodium 139  135 - 145 (mEq/L)    Potassium 3.8  3.5 - 5.1 (mEq/L)    Chloride 107  96 - 112 (mEq/L)    CO2 23  19 - 32 (mEq/L)    Glucose, Bld 131 (*) 70 - 99 (mg/dL)    BUN 16  6 - 23 (mg/dL)    Creatinine, Ser 1.61  0.50 - 1.10 (mg/dL)  Calcium 8.6  8.4 - 10.5 (mg/dL)    GFR calc non Af Amer 75 (*) >90 (mL/min)    GFR calc Af Amer 87 (*) >90 (mL/min)   CBC     Status: Abnormal   Collection Time   10/12/11  3:55 AM      Component Value Range Comment   WBC 12.2 (*) 4.0 - 10.5 (K/uL)    RBC 4.36  3.87 - 5.11 (MIL/uL)    Hemoglobin 13.9  12.0 - 15.0 (g/dL)    HCT 45.4  09.8 - 11.9 (%)    MCV 94.3  78.0 - 100.0 (fL)    MCH 31.9  26.0 - 34.0 (pg)    MCHC 33.8  30.0 - 36.0 (g/dL)    RDW 14.7  82.9 - 56.2 (%)    Platelets 243  150 - 400 (K/uL)   CARDIAC PANEL(CRET KIN+CKTOT+MB+TROPI)     Status: Normal   Collection Time   10/13/11  6:30 AM      Component Value Range Comment   Total CK 51  7 - 177 (U/L)    CK, MB 2.8  0.3 - 4.0 (ng/mL)    Troponin I <0.30  <0.30 (ng/mL)    Relative Index RELATIVE INDEX IS INVALID  0.0 - 2.5     Ct Head Wo Contrast  10/13/2011  *RADIOLOGY REPORT*  Clinical Data: Headache, nuchal rigidity.  CT HEAD WITHOUT CONTRAST  Technique:  Contiguous axial images were obtained from the base of the skull through the vertex without contrast.  Comparison: 10/13/2011  Findings: Right frontal drainage catheter is again noted in place, unchanged.  Small amount of blood along the course of the  catheter in the right frontal lobe, stable.  Stable ventricular prominence and intraventricular blood.  Scattered subarachnoid blood also stable.  Artifact from coiling to procedure in the basilar artery.  IMPRESSION: Stable exam.  Original Report Authenticated By: Cyndie Chime, M.D.   Ct Head Wo Contrast  10/13/2011  **ADDENDUM** CREATED: 10/13/2011 07:21:38  Partial opacification of the right sphenoid sinus.  **END ADDENDUM** SIGNED BY: Almedia Balls. Constance Goltz, M.D.   10/13/2011  *RADIOLOGY REPORT*  Clinical Data: Headache.  Hydrocephalus.  History of aneurysm coiling.  CT HEAD WITHOUT CONTRAST  Technique:  Contiguous axial images were obtained from the base of the skull through the vertex without contrast.  Comparison: 10/12/2011.  Findings: Right frontal drainage catheter tip right frontal horn. Small amount of blood along the course of the catheter with surrounding vasogenic edema without significant change.  Ventricular prominence is similar to the prior exam.  No significant change in amount of interventricular blood or scattered areas of subarachnoid hemorrhage.  Artifact from coiling and stenting procedure.  Small vessel disease type changes. No CT evidence of large acute infarct.  Small acute infarct cannot be excluded by CT. No intracranial mass lesion detected on this unenhanced exam.  IMPRESSION: Stable mild ventricular prominence, interventricular blood and subarachnoid blood.  Original Report Authenticated By: Fuller Canada, M.D.   Ct Head Wo Contrast  10/12/2011  *RADIOLOGY REPORT*  Clinical Data: Follow-up intraventricular hemorrhage.  CT HEAD WITHOUT CONTRAST  Technique:  Contiguous axial images were obtained from the base of the skull through the vertex without contrast.  Comparison: Multiple prior studies since 09/08/2011.  The most recent is a CT head without contrast 10/10/2011.  Findings: The intraventricular hemorrhage is again noted.  The right ventriculostomy catheters stable position.   Parenchymal hemorrhage about the catheter tract is stable.  There is some surrounding vasogenic edema.  The ventricular size is stable to slightly decreased relative to the previous study.  The temporal tips are slightly less prominent.  Basilar tip aneurysm coiling is again noted. No new hemorrhage is present.  The left basilar PCA stent is again noted as well. Minimal subarachnoid blood is stable.  IMPRESSION:  1.  Stable to slight decrease in ventricular enlargement since the prior exam. 2.  Stable appearance of subarachnoid blood. 3.  Stable intraventricular hemorrhage. 4.  Status post stent assisted aneurysm coiling of the basilar tip aneurysm.  Original Report Authenticated By: Jamesetta Orleans. MATTERN, M.D.     Assessment/Plan  Principal Problem:  *Obstructive hydrocephalus Active Problems:  Coronary artery disease  Hyperlipidemia  Hypothyroidism  Accelerated hypertension  Hyperglycemia   #1 accelerated hypertension: Blood pressure could be high due to a combination of the pain as well as use of steroids. Review of the medical records suggest that the PRN hydralazine has been tried for the past many days. Dr. Corliss Skains would like a systolic blood pressure to be between 120 and 140. We have tried giving amlodipine. We have tried the additional doses of hydralazine with no effect. Since it is very important for the blood pressure to be very well controlled per the primary team, I recommend that we start a parenteral agent in the form of nicardipine. I have discussed this with Dr. Corliss Skains and he is agreeable. Because of patient's significant bradycardia we will go ahead and stop the metoprolol for now and also to avoid significant hypotension. This can be resumed in the next day or so at a lower dose. The nicardipine should be titrated very closely and to be able to do so accurately patient will require an arterial line. Discontinuation of the steroids may also help lower the blood pressure. As  amlodipine takes effect the Cardene drip could be tapered and titrated to off.  #2 hyperglycemia: Patient does not have any history of diabetes. She has been on dexamethasone in the hospital. That is probably what is causing her blood sugars to be high. The plan is for discontinuation of the steroids. I anticipate her blood sugars will return to normal once that happens.  #3 Regarding her CAD: The issue is stable at this time. Cardiac enzymes are negative this morning. I believe cardiology has been consulted for the same.  #4 Hypothyroidism: Continue current treatment.  Rest of the patient's active issues will be managed by the interventional radiology team, neurology and neurosurgery.  We'll be happy to follow this patient along for her hypertension.  She's a full code.  All of the above discussed with Dr. Corliss Skains.  [facility]  [facility] Pager [phone]  10/13/2011, 4:15 PM

## 2011-10-13 NOTE — Progress Notes (Signed)
7 Days Post-Op  H/As more relieved.EDV draining with no evidence of fresh blood. CT brain stable.  Cardiology feels EKGs reveal no change from previous ones.Troponins unremarkable. BP continues  To be labile.130s to 160s and above. Therefore will have Internal medicine consult,regarding this and other medical issues.  Objective: Vital signs in last 24 hours: Temp:  [97.5 F (36.4 C)-98.3 F (36.8 C)] 98.3 F (36.8 C) (12/07 1612) Pulse Rate:  [44-63] 54  (12/07 1500) Resp:  [18-25] 19  (12/07 1500) BP: (133-182)/(49-87) 163/71 mmHg (12/07 1500) SpO2:  [92 %-97 %] 96 % (12/07 1500) Weight:  [147 lb 4.3 oz (66.8 kg)] 147 lb 4.3 oz (66.8 kg) (12/07 0600)    Intake/Output from previous day: 12/06 0701 - 12/07 0700 In: 1940 [P.O.:360; I.V.:1180; IV Piggyback:400] Out: 4546 [Urine:4325; Drains:221] Intake/Output this shift: Total I/O In: 670 [P.O.:120; I.V.:350; IV Piggyback:200] Out: 578 [Urine:475; Drains:103]  Exam stable.  Lab Results:   Basename 10/12/11 0355 10/11/11 0941  WBC 12.2* 13.8*  HGB 13.9 14.7  HCT 41.1 43.5  PLT 243 262   BMET  Basename 10/12/11 0355  NA 139  K 3.8  CL 107  CO2 23  GLUCOSE 131*  BUN 16  CREATININE 0.82  CALCIUM 8.6   PT/INR No results found for this basename: LABPROT:2,INR:2 in the last 72 hours ABG No results found for this basename: PHART:2,PCO2:2,PO2:2,HCO3:2 in the last 72 hours  Studies/Results: Ct Head Wo Contrast  10/13/2011  *RADIOLOGY REPORT*  Clinical Data: Headache, nuchal rigidity.  CT HEAD WITHOUT CONTRAST  Technique:  Contiguous axial images were obtained from the base of the skull through the vertex without contrast.  Comparison: 10/13/2011  Findings: Right frontal drainage catheter is again noted in place, unchanged.  Small amount of blood along the course of the catheter in the right frontal lobe, stable.  Stable ventricular prominence and intraventricular blood.  Scattered subarachnoid blood also stable.   Artifact from coiling to procedure in the basilar artery.  IMPRESSION: Stable exam.  Original Report Authenticated By: Cyndie Chime, M.D.   Ct Head Wo Contrast  10/13/2011  **ADDENDUM** CREATED: 10/13/2011 07:21:38  Partial opacification of the right sphenoid sinus.  **END ADDENDUM** SIGNED BY: Almedia Balls. Constance Goltz, M.D.   10/13/2011  *RADIOLOGY REPORT*  Clinical Data: Headache.  Hydrocephalus.  History of aneurysm coiling.  CT HEAD WITHOUT CONTRAST  Technique:  Contiguous axial images were obtained from the base of the skull through the vertex without contrast.  Comparison: 10/12/2011.  Findings: Right frontal drainage catheter tip right frontal horn. Small amount of blood along the course of the catheter with surrounding vasogenic edema without significant change.  Ventricular prominence is similar to the prior exam.  No significant change in amount of interventricular blood or scattered areas of subarachnoid hemorrhage.  Artifact from coiling and stenting procedure.  Small vessel disease type changes. No CT evidence of large acute infarct.  Small acute infarct cannot be excluded by CT. No intracranial mass lesion detected on this unenhanced exam.  IMPRESSION: Stable mild ventricular prominence, interventricular blood and subarachnoid blood.  Original Report Authenticated By: Fuller Canada, M.D.   Ct Head Wo Contrast  10/12/2011  *RADIOLOGY REPORT*  Clinical Data: Follow-up intraventricular hemorrhage.  CT HEAD WITHOUT CONTRAST  Technique:  Contiguous axial images were obtained from the base of the skull through the vertex without contrast.  Comparison: Multiple prior studies since 09/08/2011.  The most recent is a CT head without contrast 10/10/2011.  Findings: The intraventricular hemorrhage  is again noted.  The right ventriculostomy catheters stable position.  Parenchymal hemorrhage about the catheter tract is stable.  There is some surrounding vasogenic edema.  The ventricular size is stable to slightly  decreased relative to the previous study.  The temporal tips are slightly less prominent.  Basilar tip aneurysm coiling is again noted. No new hemorrhage is present.  The left basilar PCA stent is again noted as well. Minimal subarachnoid blood is stable.  IMPRESSION:  1.  Stable to slight decrease in ventricular enlargement since the prior exam. 2.  Stable appearance of subarachnoid blood. 3.  Stable intraventricular hemorrhage. 4.  Status post stent assisted aneurysm coiling of the basilar tip aneurysm.  Original Report Authenticated By: Jamesetta Orleans. MATTERN, M.D.    Anti-infectives: Anti-infectives     Start     Dose/Rate Route Frequency Ordered Stop   10/06/11 2200   vancomycin (VANCOCIN) IVPB 1000 mg/200 mL premix     Comments: ceFAZolin (ANCEF) IVPB 1 g/50 mL premix (g) 1 g Given 10/06/11 1840       1,000 mg 200 mL/hr over 60 Minutes Intravenous Every 12 hours 10/06/11 2056     10/06/11 1804   ceFAZolin (ANCEF) 1-5 GM-% IVPB     Comments: MCMILLEN, MIKE: cabinet override         10/06/11 1804 10/07/11 0614   10/05/11 1600   vancomycin (VANCOCIN) IVPB 1000 mg/200 mL premix        1,000 mg 200 mL/hr over 60 Minutes Intravenous  Once 10/05/11 1414 10/05/11 1725   10/05/11 0945   vancomycin (VANCOCIN) IVPB 1000 mg/200 mL premix        1,000 mg 200 mL/hr over 60 Minutes Intravenous  Once 10/05/11 0930 10/05/11 1017          Assessment/Plan: s/p Procedure(s): VENTRICULOSTOMY 1 Internal medicine consult.. Case discussed with Dr Barnie Del ,hospitalist. 2. D/C decadron.. 3. Control BP to between and .systolic. 4. Discussed with patient and husband..   LOS: 8 days    Joaovictor Krone K 10/13/2011

## 2011-10-13 NOTE — Progress Notes (Signed)
Arterial line placed by anesthesia.  Arterial line reading is approximately higher than cuff pressure.  Cuff pressure was taken on both arms.  Tubing checked for tight connections, air bubbles, positioning, zeroing.  Dr. Rito Ehrlich notified of this difference and stated to titrate cardene by cuff pressure instead. Patient states that her headache has gotten better than this morning.  BP per cuff is much lower than this mornings readings.    Jill [name]

## 2011-10-13 NOTE — Progress Notes (Signed)
Subjective: Patient reports Headache when bending neck. more pain radiating posterior cervical region..photophobia.  Objective: Vital signs in last 24 hours: Temp:  [97.5 F (36.4 C)-98.3 F (36.8 C)] 98.3 F (36.8 C) (12/07 1612) Pulse Rate:  [44-63] 54  (12/07 1500) Resp:  [18-25] 19  (12/07 1500) BP: (133-182)/(49-87) 163/71 mmHg (12/07 1500) SpO2:  [92 %-97 %] 96 % (12/07 1500) Weight:  [66.8 kg (147 lb 4.3 oz)] 147 lb 4.3 oz (66.8 kg) (12/07 0600)  Intake/Output from previous day: 12/06 0701 - 12/07 0700 In: 1940 [P.O.:360; I.V.:1180; IV Piggyback:400] Out: 4546 [Urine:4325; Drains:221] Intake/Output this shift: Total I/O In: 670 [P.O.:120; I.V.:350; IV Piggyback:200] Out: 578 [Urine:475; Drains:103]  Neurologic: Mental status: Alert, oriented, thought content appropriate Cranial nerves: II: pupils equal, round, reactive to light and accommodation, III,IV,VI: extraocular muscles extra-ocular motions intact, VII: upper facial muscle function normal bilaterally, VII: lower facial muscle function normal bilaterally, VIII: hearing normal, IX: soft palate elevation normal bilaterally, XI: sternocleidomastoid strength normal bilaterally, XII: tongue strength normal  Motor: Normal  Lab Results:  Basename 10/12/11 0355 10/11/11 0941  WBC 12.2* 13.8*  HGB 13.9 14.7  HCT 41.1 43.5  PLT 243 262   BMET  Basename 10/12/11 0355  NA 139  K 3.8  CL 107  CO2 23  GLUCOSE 131*  BUN 16  CREATININE 0.82  CALCIUM 8.6    Studies/Results: Ct Head Wo Contrast  10/13/2011  *RADIOLOGY REPORT*  Clinical Data: Headache, nuchal rigidity.  CT HEAD WITHOUT CONTRAST  Technique:  Contiguous axial images were obtained from the base of the skull through the vertex without contrast.  Comparison: 10/13/2011  Findings: Right frontal drainage catheter is again noted in place, unchanged.  Small amount of blood along the course of the catheter in the right frontal lobe, stable.  Stable ventricular  prominence and intraventricular blood.  Scattered subarachnoid blood also stable.  Artifact from coiling to procedure in the basilar artery.  IMPRESSION: Stable exam.  Original Report Authenticated By: Cyndie Chime, M.D.   Ct Head Wo Contrast  10/13/2011  **ADDENDUM** CREATED: 10/13/2011 07:21:38  Partial opacification of the right sphenoid sinus.  **END ADDENDUM** SIGNED BY: Almedia Balls. Constance Goltz, M.D.   10/13/2011  *RADIOLOGY REPORT*  Clinical Data: Headache.  Hydrocephalus.  History of aneurysm coiling.  CT HEAD WITHOUT CONTRAST  Technique:  Contiguous axial images were obtained from the base of the skull through the vertex without contrast.  Comparison: 10/12/2011.  Findings: Right frontal drainage catheter tip right frontal horn. Small amount of blood along the course of the catheter with surrounding vasogenic edema without significant change.  Ventricular prominence is similar to the prior exam.  No significant change in amount of interventricular blood or scattered areas of subarachnoid hemorrhage.  Artifact from coiling and stenting procedure.  Small vessel disease type changes. No CT evidence of large acute infarct.  Small acute infarct cannot be excluded by CT. No intracranial mass lesion detected on this unenhanced exam.  IMPRESSION: Stable mild ventricular prominence, interventricular blood and subarachnoid blood.  Original Report Authenticated By: Fuller Canada, M.D.   Ct Head Wo Contrast  10/12/2011  *RADIOLOGY REPORT*  Clinical Data: Follow-up intraventricular hemorrhage.  CT HEAD WITHOUT CONTRAST  Technique:  Contiguous axial images were obtained from the base of the skull through the vertex without contrast.  Comparison: Multiple prior studies since 09/08/2011.  The most recent is a CT head without contrast 10/10/2011.  Findings: The intraventricular hemorrhage is again noted.  The right ventriculostomy  catheters stable position.  Parenchymal hemorrhage about the catheter tract is stable.  There  is some surrounding vasogenic edema.  The ventricular size is stable to slightly decreased relative to the previous study.  The temporal tips are slightly less prominent.  Basilar tip aneurysm coiling is again noted. No new hemorrhage is present.  The left basilar PCA stent is again noted as well. Minimal subarachnoid blood is stable.  IMPRESSION:  1.  Stable to slight decrease in ventricular enlargement since the prior exam. 2.  Stable appearance of subarachnoid blood. 3.  Stable intraventricular hemorrhage. 4.  Status post stent assisted aneurysm coiling of the basilar tip aneurysm.  Original Report Authenticated By: Jamesetta Orleans. MATTERN, M.D.    Assessment/Plan: Afebrile but with leukocystosis. No evidence ventriculitis. The catheter hAs not been accessed since placement. Will continue to drain, although a great deal of blood remains in the occipital horns. Certainly more than i would expect. Spoke with Dr. Corliss Skains today about plan. He agrees.   LOS: 8 days     Viriginia Amendola L 10/13/2011, 4:33 PM

## 2011-10-13 NOTE — Progress Notes (Signed)
7 Days Post-Op  Subjective: Continues to C/C intermittent Rt frontal/bifrontal HAs. Denies new specific neuro symptoms. Denies SOB,chestpainsor  Chest heaviness. Tolerating meals. 12  lead EKG ordered this AM because of suspected changes in EKG,ie Slight ST elevation.. Troponins ordered.. Objective: Vital signs in last 24 hours: Temp:  [97.9 F (36.6 C)-98.2 F (36.8 C)] 98.1 F (36.7 C) (12/07 0400) Pulse Rate:  [44-60] 48  (12/07 0700) Resp:  [14-35] 20  (12/07 0700) BP: (132-182)/(51-102) 170/70 mmHg (12/07 0700) SpO2:  [92 %-96 %] 93 % (12/07 0700) Weight:  [147 lb 4.3 oz (66.8 kg)] 147 lb 4.3 oz (66.8 kg) (12/07 0600)    Intake/Output from previous day: 12/06 0701 - 12/07 0700 In: 1940 [P.O.:360; I.V.:1180; IV Piggyback:400] Out: 4546 [Urine:4325; Drains:221] Intake/Output this shift:    On exam. Labile HTN.. EVD draing appr 5cc/hr blood tinged CSF   PO2 94% RA.  Neuro Exam .  .Alert, awake oriented to time ,place and space.Marland Kitchen  Speech and comprehension clear.Marland Kitchen  PEARLA.Marland Kitchen  EOMS full.Jill [place]. Intact.  No Facial asymmetry.  Tongue Midline..  Motor..           NO drift of outstretched arms..          Power.5/5 Proximally and distally all four extremities..  Fine motor and coordination to finger to nose equal..  NO pedal edema.        Lab Results:   Basename 10/12/11 0355 10/11/11 0941  WBC 12.2* 13.8*  HGB 13.9 14.7  HCT 41.1 43.5  PLT 243 262   BMET  Basename 10/12/11 0355  NA 139  [name] 3.8  CL 107  CO2 23  GLUCOSE 131*  BUN 16  CREATININE 0.82  CALCIUM 8.6   PT/INR No results found for this basename: LABPROT:2,INR:2 in the last 72 hours ABG No results found for this basename: PHART:2,PCO2:2,PO2:2,HCO3:2 in the last 72 hours  Studies/Results: Ct Head Wo Contrast  10/13/2011  **ADDENDUM** CREATED: 10/13/2011 07:21:38  Partial opacification of the right sphenoid sinus.  **END ADDENDUM** SIGNED BY: Almedia Balls. Constance Goltz, M.D.    10/13/2011  *RADIOLOGY REPORT*  Clinical Data: Headache.  Hydrocephalus.  History of aneurysm coiling.  CT HEAD WITHOUT CONTRAST  Technique:  Contiguous axial images were obtained from the base of the skull through the vertex without contrast.  Comparison: 10/12/2011.  Findings: Right frontal drainage catheter tip right frontal horn. Small amount of blood along the course of the catheter with surrounding vasogenic edema without significant change.  Ventricular prominence is similar to the prior exam.  No significant change in amount of interventricular blood or scattered areas of subarachnoid hemorrhage.  Artifact from coiling and stenting procedure.  Small vessel disease type changes. No CT evidence of large acute infarct.  Small acute infarct cannot be excluded by CT. No intracranial mass lesion detected on this unenhanced exam.  IMPRESSION: Stable mild ventricular prominence, interventricular blood and subarachnoid blood.  Original Report Authenticated By: Fuller Canada, M.D.   Ct Head Wo Contrast  10/12/2011  *RADIOLOGY REPORT*  Clinical Data: Follow-up intraventricular hemorrhage.  CT HEAD WITHOUT CONTRAST  Technique:  Contiguous axial images were obtained from the base of the skull through the vertex without contrast.  Comparison: Multiple prior studies since 09/08/2011.  The most recent is a CT head without contrast 10/10/2011.  Findings: The intraventricular hemorrhage is again noted.  The right ventriculostomy catheters stable position.  Parenchymal hemorrhage about the catheter tract is stable.  There is some surrounding vasogenic edema.  The ventricular size is stable to slightly decreased relative to the previous study.  The temporal tips are slightly less prominent.  Basilar tip aneurysm coiling is again noted. No new hemorrhage is present.  The left basilar PCA stent is again noted as well. Minimal subarachnoid blood is stable.  IMPRESSION:  1.  Stable to slight decrease in ventricular  enlargement since the prior exam. 2.  Stable appearance of subarachnoid blood. 3.  Stable intraventricular hemorrhage. 4.  Status post stent assisted aneurysm coiling of the basilar tip aneurysm.  Original Report Authenticated By: Jamesetta Orleans. MATTERN, M.D.    Anti-infectives: Anti-infectives     Start     Dose/Rate Route Frequency Ordered Stop   10/06/11 2200   vancomycin (VANCOCIN) IVPB 1000 mg/200 mL premix     Comments: ceFAZolin (ANCEF) IVPB 1 g/50 mL premix (g) 1 g Given 10/06/11 1840       1,000 mg 200 mL/hr over 60 Minutes Intravenous Every 12 hours 10/06/11 2056     10/06/11 1804   ceFAZolin (ANCEF) 1-5 GM-% IVPB     Comments: MCMILLEN, MIKE: cabinet override         10/06/11 1804 10/07/11 0614   10/05/11 1600   vancomycin (VANCOCIN) IVPB 1000 mg/200 mL premix        1,000 mg 200 mL/hr over 60 Minutes Intravenous  Once 10/05/11 1414 10/05/11 1725   10/05/11 0945   vancomycin (VANCOCIN) IVPB 1000 mg/200 mL premix        1,000 mg 200 mL/hr over 60 Minutes Intravenous  Once 10/05/11 0930 10/05/11 1017          Assessment/Plan: s/p Procedure(s): VENTRICULOSTOMY. PLan . 1. Will have cardilogy see patient.Dr. Elease Hashimoto is pts cardilogiist.. To assess EKG changes and labile HTN. 2. CT brain .done. 3. Continue as is .   LOS: 8 days    Jill [name] 10/13/2011

## 2011-10-14 LAB — CBC
MCV: 94.5 fL (ref 78.0–100.0)
Platelets: 265 10*3/uL (ref 150–400)
RBC: 4.52 MIL/uL (ref 3.87–5.11)
WBC: 15.6 10*3/uL — ABNORMAL HIGH (ref 4.0–10.5)

## 2011-10-14 LAB — BASIC METABOLIC PANEL
CO2: 23 mEq/L (ref 19–32)
Chloride: 103 mEq/L (ref 96–112)
Creatinine, Ser: 0.76 mg/dL (ref 0.50–1.10)
Potassium: 3.7 mEq/L (ref 3.5–5.1)
Sodium: 135 mEq/L (ref 135–145)

## 2011-10-14 LAB — HEMOGLOBIN A1C
Hgb A1c MFr Bld: 6 % — ABNORMAL HIGH (ref <5.7)
Mean Plasma Glucose: 126 mg/dL — ABNORMAL HIGH (ref <117)

## 2011-10-14 MED ORDER — DOCUSATE SODIUM 100 MG PO CAPS
100.0000 mg | ORAL_CAPSULE | Freq: Two times a day (BID) | ORAL | Status: DC
  Administered 2011-10-14 – 2011-10-22 (×17): 100 mg via ORAL
  Filled 2011-10-14 (×19): qty 1

## 2011-10-14 MED ORDER — CLOPIDOGREL BISULFATE 75 MG PO TABS
75.0000 mg | ORAL_TABLET | Freq: Once | ORAL | Status: AC
  Administered 2011-10-14: 75 mg via ORAL
  Filled 2011-10-14 (×2): qty 1

## 2011-10-14 MED ORDER — POLYETHYLENE GLYCOL 3350 17 G PO PACK
17.0000 g | PACK | Freq: Every day | ORAL | Status: DC
  Administered 2011-10-14 – 2011-10-15 (×2): 17 g via ORAL
  Filled 2011-10-14 (×3): qty 1

## 2011-10-14 MED ORDER — AMLODIPINE BESYLATE 10 MG PO TABS
10.0000 mg | ORAL_TABLET | Freq: Every day | ORAL | Status: DC
  Administered 2011-10-14 – 2011-10-22 (×9): 10 mg via ORAL
  Filled 2011-10-14 (×10): qty 1

## 2011-10-14 NOTE — Progress Notes (Signed)
PCP: Elyn Aquas., MD, MD  Brief narrative:  This is a 60 year old, Caucasian female, with a past medical history of CAD, hypertension, hypothyroidism, who had a single, presyncopal event, and presented to the emergency department. 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 She had been complaining of headaches prior to that. So, the patient underwent angiogram and coiling of this aneurysm and a stent placement. This was done on November 29. This procedure was complicated by intraventricular hemorrhage. Patient required neurosurgical intervention and intraventricular catheter placement. This has been draining a bloody drainage. Patient has been monitored in the neurological intensive care unit for the last many days. Patient has been having headaches for the past few days, which has been evaluated with serial CAT scans. Neurosurgery, neurology, and Dr. Corliss Skains has been following the patient on daily basis. We were called today because of blood pressure issues. We were also asked to manage her hyperglycemia.  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 Corliss Skains  Other Providers: Stroke Team, Dr. Franky Macho  Procedures: Coiling/stenting of aneurysm, Frontal intraventricular catheter placement.  Subjective: Patient feels better as headache has improved. No new complaints. Wondering when she may be able to go home.  Objective: Vital signs in last 24 hours: Temp:  [97.5 F (36.4 C)-98.4 F (36.9 C)] 98.4 F (36.9 C) (12/07 1959) Pulse Rate:  [49-93] 61  (12/08 0730) Resp:  [3-27] 18  (12/08 0730) BP: (105-175)/(49-71) 120/59 mmHg (12/08 0730) SpO2:  [93 %-97 %] 93 % (12/08 0730) Arterial Line BP: (130-172)/(56-65) 149/56 mmHg (12/08 0730) Weight change:     Intake/Output from previous day: 12/07 0701 -  12/08 0700 In: 2190.8 [P.O.:120; I.V.:1870.8; IV Piggyback:200] Out: 2357 [Urine:2125; Drains:232] Intake/Output this shift:    General appearance: alert, cooperative and no distress Head: Intraventricular catheter seen on right side. Eyes: slightly injected conjuctiva Resp: clear to auscultation bilaterally Cardio: regular rate and rhythm, S1, S2 normal, no murmur, click, rub or gallop GI: soft, non-tender; bowel sounds normal; no masses,  no organomegaly Skin: Skin color, texture, turgor normal. No rashes or lesions Neurologic: Grossly normal  Lab Results:  Basename 10/14/11 0430 10/12/11 0355  WBC 15.6* 12.2*  HGB 14.4 13.9  HCT 42.7 41.1  PLT 265 243   BMET  Basename 10/14/11 0430 10/12/11 0355  NA 135 139  K 3.7 3.8  CL 103 107  CO2 23 23  GLUCOSE 105* 131*  BUN 15 16  CREATININE 0.76 0.82  CALCIUM 8.7 8.6    Studies/Results: Ct Head Wo Contrast  10/13/2011  *RADIOLOGY REPORT*  Clinical Data: Headache, nuchal rigidity.  CT HEAD WITHOUT CONTRAST  Technique:  Contiguous axial images were obtained from the base of the skull through the vertex without contrast.  Comparison: 10/13/2011  Findings: Right frontal drainage catheter is again noted in place, unchanged.  Small amount of blood along the course of the catheter in the right frontal lobe, stable.  Stable ventricular prominence and intraventricular blood.  Scattered subarachnoid blood also stable.  Artifact from coiling to procedure in the basilar artery.  IMPRESSION: Stable exam.  Original Report Authenticated By: Cyndie Chime, M.D.   Ct Head Wo Contrast  10/13/2011  **ADDENDUM** CREATED: 10/13/2011 07:21:38  Partial opacification of the right sphenoid sinus.  **END ADDENDUM** SIGNED BY: Almedia Balls. Constance Goltz, M.D.   10/13/2011  *RADIOLOGY REPORT*  Clinical  Data: Headache.  Hydrocephalus.  History of aneurysm coiling.  CT HEAD WITHOUT CONTRAST  Technique:  Contiguous axial images were obtained from the base of the skull  through the vertex without contrast.  Comparison: 10/12/2011.  Findings: Right frontal drainage catheter tip right frontal horn. Small amount of blood along the course of the catheter with surrounding vasogenic edema without significant change.  Ventricular prominence is similar to the prior exam.  No significant change in amount of interventricular blood or scattered areas of subarachnoid hemorrhage.  Artifact from coiling and stenting procedure.  Small vessel disease type changes. No CT evidence of large acute infarct.  Small acute infarct cannot be excluded by CT. No intracranial mass lesion detected on this unenhanced exam.  IMPRESSION: Stable mild ventricular prominence, interventricular blood and subarachnoid blood.  Original Report Authenticated By: Fuller Canada, M.D.    Medications:  Scheduled:   . amLODipine  10 mg Oral Daily  . aspirin  162 mg Oral Daily  . docusate sodium  100 mg Oral BID  . hydrochlorothiazide  25 mg Oral Daily  . ketorolac  15 mg Intravenous Once  . levothyroxine  100 mcg Oral QAC breakfast  . losartan  100 mg Oral Daily  . pantoprazole  40 mg Oral Q1200  . PARoxetine  40 mg Oral Q0700  . polyethylene glycol  17 g Oral Daily  . potassium chloride  40 mEq Oral Daily  . rosuvastatin  20 mg Oral q1800  . vancomycin  1,000 mg Intravenous Q12H  . DISCONTD: amLODipine  5 mg Oral Daily  . DISCONTD: dexamethasone  2 mg Oral Q8H  . DISCONTD: metoprolol  50 mg Oral Daily      . sodium chloride Stopped (10/14/11 0630)  . niCARDipine 12.5 mg/hr (10/14/11 0730)    Principal Problem:  *Obstructive hydrocephalus Active Problems:  Coronary artery disease  Hyperlipidemia  Hypothyroidism  Accelerated hypertension  Hyperglycemia   Assessment/Plan: #1 accelerated hypertension: Blood pressure better with cardene. Increase dose of amlodipine. Continue to titrate cardene. Headache improved. Appreciate insertion of arterial line. Dr. Corliss Skains would like systolic  blood pressure to be between 120 and 140. May consider restarting metoprolol at lower dose from tomorrow.  #2 hyperglycemia: CBG stable and within normal range. High blood sugar was likely due to steroid. No known history of diabetes.  #3 H/O CAD: The issue is stable at this time. Cardiac enzymes are negative this morning. Cardiology notified by Primary.  #4 Hypothyroidism: Continue current treatment.   #5 Constipation: Stool softeners and miralax for now.  Patient in negative fluid balance. Continue to monitor.  SCD's for DVT prophylaxis.  Rest of the patient's active issues will be managed by the interventional radiology team, neurology and neurosurgery.     LOS: 9 days   [facility] Pager [phone] 10/14/2011, 7:44 AM

## 2011-10-14 NOTE — Progress Notes (Signed)
8 Days Post-Op  Subjective: Basilar Artery Aneurysm Stent/Coil 11/29 Post intraventricular bleed--ventriculostomy placed 11/30 Pt in better BP control; improving daily   Objective: Vital signs in last 24 hours: Temp:  [97.5 F (36.4 C)-98.4 F (36.9 C)] 98.4 F (36.9 C) (12/07 1959) Pulse Rate:  [50-93] 59  (12/08 0830) Resp:  [3-27] 22  (12/08 0830) BP: (105-167)/(49-71) 118/50 mmHg (12/08 0830) SpO2:  [92 %-97 %] 96 % (12/08 0830) Arterial Line BP: (130-172)/(51-65) 143/54 mmHg (12/08 0830)    Intake/Output from previous day: 12/07 0701 - 12/08 0700 In: 2190.8 [P.O.:120; I.V.:1870.8; IV Piggyback:200] Out: 2357 [Urine:2125; Drains:232] Intake/Output this shift: Total I/O In: 117.5 [I.V.:117.5] Out: 19 [Drains:19]  PE:  BP 110s/50s Alert/orientated. Face symmetrical Good sensation/strength Bilat all 4s PERRL Resting; eating;drinking well  Lab Results:   Basename 10/14/11 0430 10/12/11 0355  WBC 15.6* 12.2*  HGB 14.4 13.9  HCT 42.7 41.1  PLT 265 243   BMET  Basename 10/14/11 0430 10/12/11 0355  NA 135 139  K 3.7 3.8  CL 103 107  CO2 23 23  GLUCOSE 105* 131*  BUN 15 16  CREATININE 0.76 0.82  CALCIUM 8.7 8.6   PT/INR No results found for this basename: LABPROT:2,INR:2 in the last 72 hours ABG No results found for this basename: PHART:2,PCO2:2,PO2:2,HCO3:2 in the last 72 hours  Studies/Results: Ct Head Wo Contrast  10/13/2011  *RADIOLOGY REPORT*  Clinical Data: Headache, nuchal rigidity.  CT HEAD WITHOUT CONTRAST  Technique:  Contiguous axial images were obtained from the base of the skull through the vertex without contrast.  Comparison: 10/13/2011  Findings: Right frontal drainage catheter is again noted in place, unchanged.  Small amount of blood along the course of the catheter in the right frontal lobe, stable.  Stable ventricular prominence and intraventricular blood.  Scattered subarachnoid blood also stable.  Artifact from coiling to procedure in  the basilar artery.  IMPRESSION: Stable exam.  Original Report Authenticated By: Cyndie Chime, M.D.   Ct Head Wo Contrast  10/13/2011  **ADDENDUM** CREATED: 10/13/2011 07:21:38  Partial opacification of the right sphenoid sinus.  **END ADDENDUM** SIGNED BY: Almedia Balls. Constance Goltz, M.D.   10/13/2011  *RADIOLOGY REPORT*  Clinical Data: Headache.  Hydrocephalus.  History of aneurysm coiling.  CT HEAD WITHOUT CONTRAST  Technique:  Contiguous axial images were obtained from the base of the skull through the vertex without contrast.  Comparison: 10/12/2011.  Findings: Right frontal drainage catheter tip right frontal horn. Small amount of blood along the course of the catheter with surrounding vasogenic edema without significant change.  Ventricular prominence is similar to the prior exam.  No significant change in amount of interventricular blood or scattered areas of subarachnoid hemorrhage.  Artifact from coiling and stenting procedure.  Small vessel disease type changes. No CT evidence of large acute infarct.  Small acute infarct cannot be excluded by CT. No intracranial mass lesion detected on this unenhanced exam.  IMPRESSION: Stable mild ventricular prominence, interventricular blood and subarachnoid blood.  Original Report Authenticated By: Fuller Canada, M.D.    Anti-infectives: Anti-infectives     Start     Dose/Rate Route Frequency Ordered Stop   10/06/11 2200   vancomycin (VANCOCIN) IVPB 1000 mg/200 mL premix     Comments: ceFAZolin (ANCEF) IVPB 1 g/50 mL premix (g) 1 g Given 10/06/11 1840       1,000 mg 200 mL/hr over 60 Minutes Intravenous Every 12 hours 10/06/11 2056     10/06/11 1804   ceFAZolin (  ANCEF) 1-5 GM-% IVPB     Comments: MCMILLEN, MIKE: cabinet override         10/06/11 1804 10/07/11 0614   10/05/11 1600   vancomycin (VANCOCIN) IVPB 1000 mg/200 mL premix        1,000 mg 200 mL/hr over 60 Minutes Intravenous  Once 10/05/11 1414 10/05/11 1725   10/05/11 0945   vancomycin  (VANCOCIN) IVPB 1000 mg/200 mL premix        1,000 mg 200 mL/hr over 60 Minutes Intravenous  Once 10/05/11 0930 10/05/11 1017          Assessment/Plan: s/p Procedure(s): VENTRICULOSTOMY  Better daily. ventriculostomy clearer Dr Monica Martinez has examined pt and is aware of status. Continue asa 81 mg 2po daily Now plavix 75mg  1 daily. Pt may have family bring food from outside. Orders in computer   Shaindy Reader A 10/14/2011

## 2011-10-14 NOTE — Progress Notes (Signed)
Patient ID: Jill [name], female   DOB: 1951/09/09, 60 y.o.   MRN: [id number] Subjective: Patient reports Doing well  Objective: Vital signs in last 24 hours: Temp:  [97.5 F (36.4 C)-98.4 F (36.9 C)] 97.6 F (36.4 C) (12/08 0830) Pulse Rate:  [51-93] 61  (12/08 0915) Resp:  [3-27] 23  (12/08 0915) BP: (105-167)/(49-71) 118/50 mmHg (12/08 0830) SpO2:  [92 %-97 %] 95 % (12/08 0915) Arterial Line BP: (130-172)/(51-65) 144/53 mmHg (12/08 0915)  Intake/Output from previous day: 12/07 0701 - 12/08 0700 In: 2190.8 [P.O.:120; I.V.:1870.8; IV Piggyback:200] Out: 2357 [Urine:2125; Drains:232] Intake/Output this shift: Total I/O In: 117.5 [I.V.:117.5] Out: 19 [Drains:19]  Awake alert conversant. Follows complex commands  Lab Results:  Basename 10/14/11 0430 10/12/11 0355  WBC 15.6* 12.2*  HGB 14.4 13.9  HCT 42.7 41.1  PLT 265 243   BMET  Basename 10/14/11 0430 10/12/11 0355  NA 135 139  K 3.7 3.8  CL 103 107  CO2 23 23  GLUCOSE 105* 131*  BUN 15 16  CREATININE 0.76 0.82  CALCIUM 8.7 8.6    Studies/Results: Ct Head Wo Contrast  10/13/2011  *RADIOLOGY REPORT*  Clinical Data: Headache, nuchal rigidity.  CT HEAD WITHOUT CONTRAST  Technique:  Contiguous axial images were obtained from the base of the skull through the vertex without contrast.  Comparison: 10/13/2011  Findings: Right frontal drainage catheter is again noted in place, unchanged.  Small amount of blood along the course of the catheter in the right frontal lobe, stable.  Stable ventricular prominence and intraventricular blood.  Scattered subarachnoid blood also stable.  Artifact from coiling to procedure in the basilar artery.  IMPRESSION: Stable exam.  Original Report Authenticated By: Cyndie Chime, M.D.   Ct Head Wo Contrast  10/13/2011  **ADDENDUM** CREATED: 10/13/2011 07:21:38  Partial opacification of the right sphenoid sinus.  **END ADDENDUM** SIGNED BY: Almedia Balls. Constance Goltz, M.D.   10/13/2011  *RADIOLOGY  REPORT*  Clinical Data: Headache.  Hydrocephalus.  History of aneurysm coiling.  CT HEAD WITHOUT CONTRAST  Technique:  Contiguous axial images were obtained from the base of the skull through the vertex without contrast.  Comparison: 10/12/2011.  Findings: Right frontal drainage catheter tip right frontal horn. Small amount of blood along the course of the catheter with surrounding vasogenic edema without significant change.  Ventricular prominence is similar to the prior exam.  No significant change in amount of interventricular blood or scattered areas of subarachnoid hemorrhage.  Artifact from coiling and stenting procedure.  Small vessel disease type changes. No CT evidence of large acute infarct.  Small acute infarct cannot be excluded by CT. No intracranial mass lesion detected on this unenhanced exam.  IMPRESSION: Stable mild ventricular prominence, interventricular blood and subarachnoid blood.  Original Report Authenticated By: Fuller Canada, M.D.    Assessment/Plan: Stable. Drain still putting out a fair amount of CSF. Further decisions per DR Cabbell/et al  LOS: 9 days  As above   Reinaldo Meeker, MD 10/14/2011, 9:19 AM

## 2011-10-15 LAB — CARDIAC PANEL(CRET KIN+CKTOT+MB+TROPI)
CK, MB: 3.8 ng/mL (ref 0.3–4.0)
Relative Index: INVALID (ref 0.0–2.5)
Total CK: 48 U/L (ref 7–177)
Troponin I: <0.3 ng/mL (ref <0.30)

## 2011-10-15 LAB — BASIC METABOLIC PANEL
Chloride: 109 mEq/L (ref 96–112)
GFR calc Af Amer: >90 mL/min (ref >90)
GFR calc non Af Amer: 88 mL/min — ABNORMAL LOW (ref >90)
Potassium: 4.2 mEq/L (ref 3.5–5.1)
Sodium: 138 mEq/L (ref 135–145)

## 2011-10-15 LAB — CBC
MCH: 32.1 pg (ref 26.0–34.0)
MCHC: 33.9 g/dL (ref 30.0–36.0)
RDW: 13.5 % (ref 11.5–15.5)

## 2011-10-15 LAB — VANCOMYCIN, TROUGH: Vancomycin Tr: 18.7 ug/mL (ref 10.0–20.0)

## 2011-10-15 MED ORDER — CLOPIDOGREL BISULFATE 75 MG PO TABS
75.0000 mg | ORAL_TABLET | Freq: Once | ORAL | Status: AC
  Administered 2011-10-15: 75 mg via ORAL
  Filled 2011-10-15: qty 1

## 2011-10-15 MED ORDER — PANTOPRAZOLE SODIUM 40 MG PO TBEC
40.0000 mg | DELAYED_RELEASE_TABLET | Freq: Every day | ORAL | Status: DC
  Administered 2011-10-15: 40 mg via ORAL
  Filled 2011-10-15: qty 1

## 2011-10-15 MED ORDER — GI COCKTAIL ~~LOC~~
30.0000 mL | Freq: Once | ORAL | Status: AC | PRN
  Administered 2011-10-15: 30 mL via ORAL
  Filled 2011-10-15: qty 30

## 2011-10-15 NOTE — Progress Notes (Signed)
Patient ID: Jill [name], female   DOB: [birth year], 60 y.o.   MRN: [id number] Subjective: Patient reports No complaints  Objective: Vital signs in last 24 hours: Temp:  [97.6 F (36.4 C)-98.2 F (36.8 C)] 97.6 F (36.4 C) (12/09 0000) Pulse Rate:  [55-86] 67  (12/09 0715) Resp:  [14-26] 20  (12/09 0715) BP: (98-124)/(41-88) 120/50 mmHg (12/09 0715) SpO2:  [90 %-98 %] 97 % (12/09 0715) Arterial Line BP: (75-156)/(43-72) 84/72 mmHg (12/09 0645)  Intake/Output from previous day: 12/08 0701 - 12/09 0700 In: 2368.7 [P.O.:480; I.V.:1488.7; IV Piggyback:400] Out: 1515 [Urine:1300; Drains:215] Intake/Output this shift:    Awake alert conversant. Follows complex commands  Lab Results:  Basename 10/14/11 0430  WBC 15.6*  HGB 14.4  HCT 42.7  PLT 265   BMET  Basename 10/15/11 0345 10/14/11 0430  NA 138 135  K 4.2 3.7  CL 109 103  CO2 21 23  GLUCOSE 120* 105*  BUN 14 15  CREATININE 0.78 0.76  CALCIUM 8.3* 8.7    Studies/Results: Ct Head Wo Contrast  10/13/2011  *RADIOLOGY REPORT*  Clinical Data: Headache, nuchal rigidity.  CT HEAD WITHOUT CONTRAST  Technique:  Contiguous axial images were obtained from the base of the skull through the vertex without contrast.  Comparison: 10/13/2011  Findings: Right frontal drainage catheter is again noted in place, unchanged.  Small amount of blood along the course of the catheter in the right frontal lobe, stable.  Stable ventricular prominence and intraventricular blood.  Scattered subarachnoid blood also stable.  Artifact from coiling to procedure in the basilar artery.  IMPRESSION: Stable exam.  Original Report Authenticated By: Cyndie Chime, M.D.    Assessment/Plan: Doing well. Cont present rx. Plan per Dr Franky Macho  LOS: 10 days  As above   Reinaldo Meeker, MD 10/15/2011, 8:11 AM

## 2011-10-15 NOTE — Progress Notes (Signed)
PCP: Elyn Aquas., MD, MD  Brief HPI:  This is a 60 year old, Caucasian female, with a past medical history of CAD, hypertension, hypothyroidism, who had a single, presyncopal event, and presented to the emergency department. 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 She had been complaining of headaches prior to that. So, the patient underwent angiogram and coiling of this aneurysm and a stent placement. This was done on November 29. This procedure was complicated by intraventricular hemorrhage. Patient required neurosurgical intervention and intraventricular catheter placement. This has been draining a bloody drainage. Patient has been monitored in the neurological intensive care unit for the last many days. Patient has been having headaches for the past few days, which has been evaluated with serial CAT scans. Neurosurgery, neurology, and Dr. Corliss Skains has been following the patient on daily basis. We were called today because of blood pressure issues. We were also asked to manage her hyperglycemia.  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 Corliss Skains  Other Providers: Stroke Team, Dr. Franky Macho  Procedures: Coiling/stenting of aneurysm, Frontal intraventricular catheter placement.  Subjective: Patient complaining of indigestion for past many days. No CP or SOB. Patient says headache has improved.   Objective: Vital signs in last 24 hours: Temp:  [97.6 F (36.4 C)-98.2 F (36.8 C)] 97.6 F (36.4 C) (12/09 0000) Pulse Rate:  [55-86] 67  (12/09 0715) Resp:  [14-26] 20  (12/09 0715) BP: (98-124)/(41-88) 120/50 mmHg (12/09 0715) SpO2:  [90 %-98 %] 97 % (12/09 0715) Arterial Line BP: (75-156)/(43-72) 84/72 mmHg (12/09 0645) Weight change:     Intake/Output from previous day: 12/08 0701 - 12/09  0700 In: 2368.7 [P.O.:480; I.V.:1488.7; IV Piggyback:400] Out: 1515 [Urine:1300; Drains:215] Intake/Output this shift:    General appearance: alert, cooperative and no distress Head: Intraventricular catheter seen on right side. Eyes: slightly injected conjuctiva Resp: clear to auscultation bilaterally Cardio: regular rate and rhythm, S1, S2 normal, no murmur, click, rub or gallop GI: soft, non-tender; bowel sounds normal; no masses,  no organomegaly Skin: Skin color, texture, turgor normal. No rashes or lesions Neurologic: Grossly normal  Lab Results:  Basename 10/14/11 0430  WBC 15.6*  HGB 14.4  HCT 42.7  PLT 265   BMET  Basename 10/15/11 0345 10/14/11 0430  NA 138 135  K 4.2 3.7  CL 109 103  CO2 21 23  GLUCOSE 120* 105*  BUN 14 15  CREATININE 0.78 0.76  CALCIUM 8.3* 8.7    Studies/Results: Ct Head Wo Contrast  10/13/2011  *RADIOLOGY REPORT*  Clinical Data: Headache, nuchal rigidity.  CT HEAD WITHOUT CONTRAST  Technique:  Contiguous axial images were obtained from the base of the skull through the vertex without contrast.  Comparison: 10/13/2011  Findings: Right frontal drainage catheter is again noted in place, unchanged.  Small amount of blood along the course of the catheter in the right frontal lobe, stable.  Stable ventricular prominence and intraventricular blood.  Scattered subarachnoid blood also stable.  Artifact from coiling to procedure in the basilar artery.  IMPRESSION: Stable exam.  Original Report Authenticated By: Cyndie Chime, M.D.    Medications:  Scheduled:    . amLODipine  10 mg Oral Daily  . aspirin  162 mg Oral Daily  . clopidogrel  75 mg Oral Once  . docusate sodium  100 mg Oral BID  . hydrochlorothiazide  25 mg Oral Daily  . ketorolac  15 mg Intravenous Once  . levothyroxine  100 mcg Oral QAC breakfast  . losartan  100 mg Oral Daily  . pantoprazole  40 mg Oral Q1200  . PARoxetine  40 mg Oral Q0700  . polyethylene glycol  17 g Oral  Daily  . potassium chloride  40 mEq Oral Daily  . rosuvastatin  20 mg Oral q1800  . vancomycin  1,000 mg Intravenous Q12H      . sodium chloride Stopped (10/14/11 1600)  . niCARDipine 5 mg/hr (10/15/11 0743)    Principal Problem:  *Obstructive hydrocephalus Active Problems:  Coronary artery disease  Hyperlipidemia  Hypothyroidism  Accelerated hypertension  Hyperglycemia   Assessment/Plan: #1 accelerated hypertension: Blood pressure better. Cardene stopped this morning. Amlodipine may have taken effect. Headache improved. Arterial line not functioning properly. Will discontinue. Dr. Corliss Skains would like systolic blood pressure to be between 120 and 140. May consider restarting metoprolol at lower dose is pressures remains high off cardene.  #2 hyperglycemia: Resolved. CBG stable and within normal range. High blood sugar was likely due to steroid. No known history of diabetes. Stop checking CBG's.  #3 H/O CAD: The issue is stable at this time. Cardiac enzymes are negative this morning. Issue discussed with cardiology by primary service.  #4 Hypothyroidism: Continue current treatment.   #5 Constipation: Stool softeners and miralax for now.  #6 Indigestion: Maalox. PPI. Give GI cocktail. Check cardiac enzymes as she does have h/o CAD.  Patient almost in even fluid balance. Continue to monitor.  SCD's for DVT prophylaxis.  Rest of the patient's active issues will be managed by the interventional radiology team, neurology and neurosurgery.     LOS: 10 days   [facility] Pager [phone] 10/15/2011, 7:56 AM

## 2011-10-15 NOTE — Progress Notes (Signed)
9 Days Post-Op  Subjective: Basilar artery aneurysm stent/coil 11/29-intraventricular bleed/ventriculostomy 11/30 Improving every day No real complaints  Objective: Vital signs in last 24 hours: Temp:  [97.6 F (36.4 C)-98.2 F (36.8 C)] 97.8 F (36.6 C) (12/09 0800) Pulse Rate:  [55-86] 65  (12/09 0800) Resp:  [16-26] 18  (12/09 0800) BP: (98-124)/(41-88) 123/52 mmHg (12/09 0800) SpO2:  [90 %-98 %] 96 % (12/09 0800) Arterial Line BP: (75-156)/(43-72) 95/71 mmHg (12/09 0800)    Intake/Output from previous day: 12/08 0701 - 12/09 0700 In: 2368.7 [P.O.:480; I.V.:1488.7; IV Piggyback:400] Out: 1515 [Urine:1300; Drains:215] Intake/Output this shift: Total I/O In: 30 [P.O.:30] Out: 14 [Drains:14]  PE:  VSS; sitting up Eating and drinking well. BP controlled. A&O x 4; pleasant Good strength and good movement all 4s Ventic clear-straw colored; approx 10cc/hr  Lab Results:   Basename 10/14/11 0430  WBC 15.6*  HGB 14.4  HCT 42.7  PLT 265   BMET  Basename 10/15/11 0345 10/14/11 0430  NA 138 135  K 4.2 3.7  CL 109 103  CO2 21 23  GLUCOSE 120* 105*  BUN 14 15  CREATININE 0.78 0.76  CALCIUM 8.3* 8.7   PT/INR No results found for this basename: LABPROT:2,INR:2 in the last 72 hours ABG No results found for this basename: PHART:2,PCO2:2,PO2:2,HCO3:2 in the last 72 hours  Studies/Results: Ct Head Wo Contrast  10/13/2011  *RADIOLOGY REPORT*  Clinical Data: Headache, nuchal rigidity.  CT HEAD WITHOUT CONTRAST  Technique:  Contiguous axial images were obtained from the base of the skull through the vertex without contrast.  Comparison: 10/13/2011  Findings: Right frontal drainage catheter is again noted in place, unchanged.  Small amount of blood along the course of the catheter in the right frontal lobe, stable.  Stable ventricular prominence and intraventricular blood.  Scattered subarachnoid blood also stable.  Artifact from coiling to procedure in the basilar artery.   IMPRESSION: Stable exam.  Original Report Authenticated By: Cyndie Chime, M.D.    Anti-infectives: Anti-infectives     Start     Dose/Rate Route Frequency Ordered Stop   10/06/11 2200   vancomycin (VANCOCIN) IVPB 1000 mg/200 mL premix     Comments: ceFAZolin (ANCEF) IVPB 1 g/50 mL premix (g) 1 g Given 10/06/11 1840       1,000 mg 200 mL/hr over 60 Minutes Intravenous Every 12 hours 10/06/11 2056     10/06/11 1804   ceFAZolin (ANCEF) 1-5 GM-% IVPB     Comments: MCMILLEN, MIKE: cabinet override         10/06/11 1804 10/07/11 0614   10/05/11 1600   vancomycin (VANCOCIN) IVPB 1000 mg/200 mL premix        1,000 mg 200 mL/hr over 60 Minutes Intravenous  Once 10/05/11 1414 10/05/11 1725   10/05/11 0945   vancomycin (VANCOCIN) IVPB 1000 mg/200 mL premix        1,000 mg 200 mL/hr over 60 Minutes Intravenous  Once 10/05/11 0930 10/05/11 1017          Assessment/Plan: s/p Procedure(s): VENTRICULOSTOMY  Cerebral Aneurysm stent/coil 11/29; intraventricular bleed-ventriculostomy 11/30 Pt doing well; improving Orders in comp for labs in am Pt examined by Dr Bradly Chris A 10/15/2011

## 2011-10-16 DIAGNOSIS — I2510 Atherosclerotic heart disease of native coronary artery without angina pectoris: Secondary | ICD-10-CM

## 2011-10-16 DIAGNOSIS — I671 Cerebral aneurysm, nonruptured: Principal | ICD-10-CM

## 2011-10-16 DIAGNOSIS — I10 Essential (primary) hypertension: Secondary | ICD-10-CM

## 2011-10-16 LAB — DIFFERENTIAL
Basophils Absolute: 0 10*3/uL (ref 0.0–0.1)
Basophils Relative: 0 % (ref 0–1)
Eosinophils Absolute: 0.5 10*3/uL (ref 0.0–0.7)
Monocytes Absolute: 0.8 10*3/uL (ref 0.1–1.0)
Neutro Abs: 6.4 10*3/uL (ref 1.7–7.7)
Neutrophils Relative %: 62 % (ref 43–77)

## 2011-10-16 LAB — BASIC METABOLIC PANEL
Calcium: 8.6 mg/dL (ref 8.4–10.5)
Creatinine, Ser: 0.79 mg/dL (ref 0.50–1.10)
GFR calc non Af Amer: 87 mL/min — ABNORMAL LOW (ref >90)
Sodium: 136 mEq/L (ref 135–145)

## 2011-10-16 MED ORDER — PANTOPRAZOLE SODIUM 40 MG PO TBEC
40.0000 mg | DELAYED_RELEASE_TABLET | Freq: Two times a day (BID) | ORAL | Status: DC
  Administered 2011-10-16 – 2011-10-22 (×12): 40 mg via ORAL
  Filled 2011-10-16 (×13): qty 1

## 2011-10-16 MED ORDER — POLYETHYLENE GLYCOL 3350 17 G PO PACK
17.0000 g | PACK | Freq: Two times a day (BID) | ORAL | Status: DC
  Administered 2011-10-16 – 2011-10-21 (×6): 17 g via ORAL
  Filled 2011-10-16 (×15): qty 1

## 2011-10-16 MED ORDER — METOPROLOL TARTRATE 12.5 MG HALF TABLET
12.5000 mg | ORAL_TABLET | Freq: Two times a day (BID) | ORAL | Status: DC
  Administered 2011-10-16 – 2011-10-17 (×4): 12.5 mg via ORAL
  Filled 2011-10-16 (×6): qty 1

## 2011-10-16 NOTE — Progress Notes (Signed)
Utilization review completed. Sandra Tellefsen, RN, BSN. 10/16/11  

## 2011-10-16 NOTE — Progress Notes (Signed)
PCP: Elyn Aquas., MD, MD  Brief HPI:  This is a 61 year old, Caucasian female, with a past medical history of CAD, hypertension, hypothyroidism, who had a single, presyncopal event, and presented to the emergency department. 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 She had been complaining of headaches prior to that. So, the patient underwent angiogram and coiling of this aneurysm and a stent placement. This was done on November 29. This procedure was complicated by intraventricular hemorrhage. Patient required neurosurgical intervention and intraventricular catheter placement. This has been draining a bloody drainage. Patient has been monitored in the neurological intensive care unit for the last many days. Patient has been having headaches for the past few days, which has been evaluated with serial CAT scans. Neurosurgery, neurology, and Dr. Corliss Skains has been following the patient on daily basis. We were called today because of blood pressure issues. We were also asked to manage her hyperglycemia.  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 Corliss Skains  Other Providers: Stroke Team, Dr. Franky Macho  Procedures:  1. Coiling/stenting of aneurysm,  2. Frontal intraventricular catheter placement.  Subjective: Patient feels better. Headache is much better. Indigestion is better though it persists. No nausea. Small BM yesterday. Tolerating PO.  Objective: Vital signs in last 24 hours: Temp:  [97.6 F (36.4 C)-98.3 F (36.8 C)] 97.8 F (36.6 C) (12/10 0400) Pulse Rate:  [66-81] 73  (12/10 0700) Resp:  [17-27] 21  (12/10 0700) BP: (89-154)/(44-78) 154/76 mmHg (12/10 0700) SpO2:  [92 %-98 %] 96 % (12/10 0700) Weight change:  Last BM Date: 10/15/11  Intake/Output from previous day: 12/09 0701 - 12/10  0700 In: 1970 [P.O.:1970] Out: 5593 [Urine:5425; Drains:167; Stool:1] Intake/Output this shift:    General appearance: alert, cooperative and no distress Head: Intraventricular catheter seen on right side of scalp. Eyes: slightly injected conjuctiva Resp: clear to auscultation bilaterally Cardio: regular rate and rhythm, S1, S2 normal, no murmur, click, rub or gallop GI: soft, non-tender; bowel sounds normal; no masses,  no organomegaly Skin: Skin color, texture, turgor normal. No rashes or lesions Neurologic: Grossly normal  Lab Results:  Basename 10/15/11 1017 10/14/11 0430  WBC 14.4* 15.6*  HGB 13.2 14.4  HCT 38.9 42.7  PLT 250 265   BMET  Basename 10/16/11 0600 10/15/11 0345  NA 136 138  K 4.2 4.2  CL 104 109  CO2 24 21  GLUCOSE 112* 120*  BUN 13 14  CREATININE 0.79 0.78  CALCIUM 8.6 8.3*    Studies/Results: No results found.  Medications:  Scheduled:    . amLODipine  10 mg Oral Daily  . aspirin  162 mg Oral Daily  . clopidogrel  75 mg Oral Once  . docusate sodium  100 mg Oral BID  . hydrochlorothiazide  25 mg Oral Daily  . ketorolac  15 mg Intravenous Once  . levothyroxine  100 mcg Oral QAC breakfast  . losartan  100 mg Oral Daily  . pantoprazole  40 mg Oral Q1200  . PARoxetine  40 mg Oral Q0700  . polyethylene glycol  17 g Oral Daily  . potassium chloride  40 mEq Oral Daily  . rosuvastatin  20 mg Oral q1800  . DISCONTD: pantoprazole  40 mg Oral Q1200  . DISCONTD: vancomycin  1,000 mg Intravenous Q12H    Principal Problem:  *Obstructive hydrocephalus Active Problems:  Coronary artery disease  Hyperlipidemia  Hypothyroidism  Accelerated hypertension   Assessment/Plan: #1 Accelerated hypertension: Blood pressure much better. Cardene off since yesterday morning. Amlodipine has taken effect. Headache improved. Dr. Corliss Skains would like systolic blood pressure to be between 120 and 140. Since BP is slightly high this morning and since she has  history of CAD will restart metoprolol at lower dose with holding parameters.  #2 hyperglycemia: Resolved. High blood sugar was likely due to steroid. No known history of diabetes.   #3 H/O CAD: The issue is stable at this time.   #4 Hypothyroidism: Continue current treatment.   #5 Constipation: Stool softeners and miralax. Change miralax to BID. Avoid straining.  #6 Indigestion: Maalox. Change PPI to BID.   SCD's for DVT prophylaxis.  Rest of the patient's active issues will be managed by the interventional radiology team, neurology and neurosurgery.   Medicine will continue to follow for now.    LOS: 11 days   [facility] Pager [phone] 10/16/2011, 8:03 AM

## 2011-10-16 NOTE — Progress Notes (Signed)
Subjective:   Jill [name] is a 60 yo with hx of CAD - s/p CABG, HTN, hypothyroidism admitted after a syncopal event.  She was found to have a basilar artery aneurism that was coiled.  This procedure was complicated with an intraventricular hemorrhage.   Jill [name]'s husband called last week to request that I fill out disability papers.  My nurse explained that she would need to contact her primary care doctor, Jill Blossom, MD.  She is awake and alert.  No pain.  BP is better controlled.      Marland Kitchen amLODipine  10 mg Oral Daily  . aspirin  162 mg Oral Daily  . clopidogrel  75 mg Oral Once  . docusate sodium  100 mg Oral BID  . hydrochlorothiazide  25 mg Oral Daily  . ketorolac  15 mg Intravenous Once  . levothyroxine  100 mcg Oral QAC breakfast  . losartan  100 mg Oral Daily  . metoprolol tartrate  12.5 mg Oral BID  . pantoprazole  40 mg Oral BID AC  . PARoxetine  40 mg Oral Q0700  . polyethylene glycol  17 g Oral BID  . potassium chloride  40 mEq Oral Daily  . rosuvastatin  20 mg Oral q1800  . DISCONTD: pantoprazole  40 mg Oral Q1200  . DISCONTD: pantoprazole  40 mg Oral Q1200  . DISCONTD: polyethylene glycol  17 g Oral Daily  . DISCONTD: vancomycin  1,000 mg Intravenous Q12H      . sodium chloride Stopped (10/14/11 1600)  . DISCONTD: niCARDipine Stopped (10/15/11 0800)    Objective:  Vital Signs in the last 24 hours: Blood pressure 154/76, pulse 73, temperature 97.8 F (36.6 C), temperature source Oral, resp. rate 21, height 5\' 7"  (1.702 m), weight 147 lb 4.3 oz (66.8 kg), SpO2 96.00%. Temp:  [97.6 F (36.4 C)-98.3 F (36.8 C)] 97.8 F (36.6 C) (12/10 0400) Pulse Rate:  [66-81] 73  (12/10 0700) Resp:  [17-27] 21  (12/10 0700) BP: (89-154)/(44-78) 154/76 mmHg (12/10 0700) SpO2:  [92 %-98 %] 96 % (12/10 0700)  Intake/Output from previous day: 12/09 0701 - 12/10 0700 In: 1970 [P.O.:1970] Out: 5593 [Urine:5425; Drains:167; Stool:1] Intake/Output from this shift:    Physical  Exam: The patient is alert and oriented x 3.  The mood and affect are normal.   Skin: warm and dry.  Color is normal.    HEENT:   the sclera are nonicteric.  The mucous membranes are moist.  The carotids are 2+ without bruits.  There is no thyromegaly.  There is no JVD.  She has a drainage tube in her skull  Lungs: clear.  The chest wall is non tender.    Heart: regular rate with a normal S1 and S2.  There are no murmurs, gallops, or rubs. The PMI is not displaced.     Abdomen: good bowel sounds.  There is no guarding or rebound.  There is no hepatosplenomegaly or tenderness.  There are no masses.   Extremities:  no clubbing, cyanosis, or edema.  The legs are without rashes.  The distal pulses are intact.   Neuro:  Cranial nerves II - XII are intact.  Motor and sensory functions are intact.     Lab Results:  Basename 10/15/11 1017 10/14/11 0430  WBC 14.4* 15.6*  HGB 13.2 14.4  PLT 250 265    Basename 10/16/11 0600 10/15/11 0345  NA 136 138  K 4.2 4.2  CL 104 109  CO2 24 21  GLUCOSE 112* 120*  BUN 13 14  CREATININE 0.79 0.78    Basename 10/15/11 0903  TROPONINI <0.30   No results found for this basename: BNP in the last 72 hours Hepatic Function Panel No results found for this basename: PROT,ALBUMIN,AST,ALT,ALKPHOS,BILITOT,BILIDIR,IBILI in the last 72 hours Lab Results  Component Value Date   CHOL 121 05/13/2011   HDL 43 05/13/2011   LDLCALC 69 05/13/2011   TRIG 45 05/13/2011   CHOLHDL 2.8 05/13/2011   No results found for this basename: INR in the last 72 hours  Tele:  NSR  Assessment/Plan:   1.  Obstructive hydrocephalus   Plans per neurosurgery  2.  Coronary artery disease     No angina  3.  Hyperlipidemia :  Continue current meds  4.  hypertension :  Continue current meds. BP is fairly well controlled.      Vesta Mixer, Montez Hageman., MD, [facility] 10/16/2011, 8:41 AM LOS: Day 11

## 2011-10-16 NOTE — Progress Notes (Signed)
10 Days Post-Op  Subjective: Headaches sig improved ,though coughing will precipitate them. Says feels hungry. Keeping most of her meals down. Denies any new neuro or systemic symptoms. EVD draining 5 to 10 cc?hr. Vanc levels therapeutic. ZOX09..4 from yesterday. BP better controlled.  Objective: Vital signs in last 24 hours: Temp:  [97.6 F (36.4 C)-98.3 F (36.8 C)] 98 F (36.7 C) (12/10 0800) Pulse Rate:  [66-85] 85  (12/10 1000) Resp:  [17-27] 23  (12/10 1000) BP: (94-156)/(44-79) 156/79 mmHg (12/10 1000) SpO2:  [92 %-98 %] 96 % (12/10 1000) Last BM Date: 10/15/11  Intake/Output from previous day: 12/09 0701 - 12/10 0700 In: 1970 [P.O.:1970] Out: 5593 [Urine:5425; Drains:167; Stool:1] Intake/Output this shift: Total I/O In: 600 [P.O.:600] Out: 19 [Drains:19]  On Exam Alert, awake oriented to time ,place and space.Marland Kitchen  Speech and comprehension clear.Marland Kitchen  PEARLA.Marland Kitchen  EOMS full.Jill [place].Full to confrontation.  No Facial asymmetry.  Tongue Midline..  Motor..           NO drift of outstretched arms..          Power.5/5 Proximally and distally all four extremities..  Fine motor and coordination to finger to nose equal..  Lab Results:   Basename 10/15/11 1017 10/14/11 0430  WBC 14.4* 15.6*  HGB 13.2 14.4  HCT 38.9 42.7  PLT 250 265   BMET  Basename 10/16/11 0600 10/15/11 0345  NA 136 138  K 4.2 4.2  CL 104 109  CO2 24 21  GLUCOSE 112* 120*  BUN 13 14  CREATININE 0.79 0.78  CALCIUM 8.6 8.3*   PT/INR No results found for this basename: LABPROT:2,INR:2 in the last 72 hours ABG No results found for this basename: PHART:2,PCO2:2,PO2:2,HCO3:2 in the last 72 hours  Studies/Results: No results found.  Anti-infectives: Anti-infectives     Start     Dose/Rate Route Frequency Ordered Stop   10/06/11 2200   vancomycin (VANCOCIN) IVPB 1000 mg/200 mL premix  Status:  Discontinued     Comments: ceFAZolin (ANCEF) IVPB 1 g/50 mL premix (g) 1 g Given  10/06/11 1840       1,000 mg 200 mL/hr over 60 Minutes Intravenous Every 12 hours 10/06/11 2056 10/15/11 1058   10/06/11 1804   ceFAZolin (ANCEF) 1-5 GM-% IVPB     Comments: MCMILLEN, MIKE: cabinet override         10/06/11 1804 10/07/11 0614   10/05/11 1600   vancomycin (VANCOCIN) IVPB 1000 mg/200 mL premix        1,000 mg 200 mL/hr over 60 Minutes Intravenous  Once 10/05/11 1414 10/05/11 1725   10/05/11 0945   vancomycin (VANCOCIN) IVPB 1000 mg/200 mL premix        1,000 mg 200 mL/hr over 60 Minutes Intravenous  Once 10/05/11 0930 10/05/11 1017          Assessment/Plan: s/p Procedure(s): VENTRICULOSTOMY 1. Continue as is.  2. Will await NES recs regarding EVD removal, and increase ambulation. 3. Platelet inhibition in AM tomorrow.  LOS: 11 days    Emeri Estill K 10/16/2011

## 2011-10-16 NOTE — Progress Notes (Signed)
10 Days Post-Op  Subjective: Basilar artery aneurysm stent/coil 11/29; intraventricular bleed-ventriculostomy 11/30 Doing well.  Objective: Vital signs in last 24 hours: Temp:  [97.6 F (36.4 C)-98.3 F (36.8 C)] 98 F (36.7 C) (12/10 0800) Pulse Rate:  [66-84] 81  (12/10 0957) Resp:  [17-27] 20  (12/10 0900) BP: (89-154)/(44-78) 148/71 mmHg (12/10 0900) SpO2:  [92 %-98 %] 96 % (12/10 0900) Last BM Date: 10/15/11  Intake/Output from previous day: 12/09 0701 - 12/10 0700 In: 1970 [P.O.:1970] Out: 5593 [Urine:5425; Drains:167; Stool:1] Intake/Output this shift: Total I/O In: 360 [P.O.:360] Out: 13 [Drains:13]  PE: afeb; VSS; A&O x4 Eating and drinking well; urinating and bowel movement Face symmetrical; smile = Neuro intact Ventric intact-straw colored; 5-10 cc /hr    Lab Results:   Basename 10/15/11 1017 10/14/11 0430  WBC 14.4* 15.6*  HGB 13.2 14.4  HCT 38.9 42.7  PLT 250 265   BMET  Basename 10/16/11 0600 10/15/11 0345  NA 136 138  K 4.2 4.2  CL 104 109  CO2 24 21  GLUCOSE 112* 120*  BUN 13 14  CREATININE 0.79 0.78  CALCIUM 8.6 8.3*   PT/INR No results found for this basename: LABPROT:2,INR:2 in the last 72 hours ABG No results found for this basename: PHART:2,PCO2:2,PO2:2,HCO3:2 in the last 72 hours  Studies/Results: No results found.  Anti-infectives: Anti-infectives     Start     Dose/Rate Route Frequency Ordered Stop   10/06/11 2200   vancomycin (VANCOCIN) IVPB 1000 mg/200 mL premix  Status:  Discontinued     Comments: ceFAZolin (ANCEF) IVPB 1 g/50 mL premix (g) 1 g Given 10/06/11 1840       1,000 mg 200 mL/hr over 60 Minutes Intravenous Every 12 hours 10/06/11 2056 10/15/11 1058   10/06/11 1804   ceFAZolin (ANCEF) 1-5 GM-% IVPB     Comments: MCMILLEN, MIKE: cabinet override         10/06/11 1804 10/07/11 0614   10/05/11 1600   vancomycin (VANCOCIN) IVPB 1000 mg/200 mL premix        1,000 mg 200 mL/hr over 60 Minutes Intravenous  Once  10/05/11 1414 10/05/11 1725   10/05/11 0945   vancomycin (VANCOCIN) IVPB 1000 mg/200 mL premix        1,000 mg 200 mL/hr over 60 Minutes Intravenous  Once 10/05/11 0930 10/05/11 1017          Assessment/Plan: s/p Procedure(s): VENTRICULOSTOMY  Basilar artery stent/coil - bleed-ventriculostomy Improving daily RN asking if pt can sit in chair to eat--must ask Dr Franky Macho Discussed with Dr Corliss Skains pt status Will follow   Latarsha Zani A 10/16/2011

## 2011-10-16 NOTE — Progress Notes (Addendum)
Patient ID: Jill [name], female   DOB: 13-Jan-1951, 60 y.o.   MRN: [id number] BP 132/66  Pulse 68  Temp(Src) 98 F (36.7 C) (Oral)  Resp 21  Ht 5\' 7"  (1.702 m)  Wt 66.8 kg (147 lb 4.3 oz)  BMI 23.07 kg/m2  SpO2 95%  Alert, oriented x4.  PEERL, moving all extremities well. Afebrile Draining csf still, doing well neurologically.  No new recs. Stable.

## 2011-10-17 LAB — PLATELET INHIBITION P2Y12: Platelet Function  P2Y12: 209 [PRU] (ref 194–418)

## 2011-10-17 MED ORDER — CLOPIDOGREL BISULFATE 75 MG PO TABS
75.0000 mg | ORAL_TABLET | Freq: Once | ORAL | Status: AC
  Administered 2011-10-17: 75 mg via ORAL
  Filled 2011-10-17: qty 1

## 2011-10-17 MED ORDER — ACETAMINOPHEN 325 MG PO TABS
650.0000 mg | ORAL_TABLET | ORAL | Status: DC | PRN
  Administered 2011-10-17 – 2011-10-22 (×16): 650 mg via ORAL
  Filled 2011-10-17 (×17): qty 2

## 2011-10-17 NOTE — Progress Notes (Signed)
CONSULT FOLLOW-UP NOTE  Jill [name] GNF:[id number] DOB: [birth year] DOA: 10/05/2011 PCP: Elyn Aquas., MD, MD Requesting physician: Dr. Corliss Skains  Brief narrative: This is a 60 year old, Caucasian female, with a past medical history of CAD, hypertension, hypothyroidism, who had a single, presyncopal event, and presented to the emergency department. 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 She had been complaining of headaches prior to that. So, the patient underwent angiogram and coiling of this aneurysm and a stent placement. This was done on November 29. This procedure was complicated by intraventricular hemorrhage. Patient required neurosurgical intervention and intraventricular catheter placement. This has been draining a bloody drainage. Patient has been monitored in the neurological intensive care unit for the last many days. Patient has been having headaches for the past few days, which has been evaluated with serial CAT scans. Neurosurgery, neurology, and Dr. Corliss Skains has been following the patient on daily basis. We were called today because of blood pressure issues. We were also asked to manage her hyperglycemia.  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 Franky Macho)  Procedures:  November 29: Basilar artery aneurysm stent/coil  November 30: Frontal intraventricular catheter placement.  Interim History: Documentation reviewed. Subjective: Complaints of headache.  Objective:  Filed Vitals:   10/17/11 1200 10/17/11 1300 10/17/11 1400 10/17/11 1500  BP: 121/58 128/78 121/76 132/79  Pulse: 61 66 64 58  Temp: 98.6 F (37 C)     TempSrc: Oral     Resp: 13 23 17    Height:      Weight:      SpO2: 94% 95% 95% 96%    Intake/Output Summary (Last 24 hours) at  10/17/11 1516 Last data filed at 10/17/11 1500  Gross per 24 hour  Intake    750 ml  Output   3061 ml  Net  -2311 ml    Exam: General: Appears well. Cardiovascular: Regular rate and rhythm. No murmur, rub or gallop. No lower extremity edema. Respiratory: Clear to auscultation bilaterally. No wheezes, rales or rhonchi. Normal respiratory effort.  Data Reviewed: Basic Metabolic Panel:  Lab 10/16/11 8469 10/15/11 0345 10/14/11 0430 10/12/11 0355  NA 136 138 135 139  K 4.2 4.2 -- --  CL 104 109 103 107  CO2 24 21 23 23   GLUCOSE 112* 120* 105* 131*  BUN 13 14 15 16   CREATININE 0.79 0.78 0.76 0.82  CALCIUM 8.6 8.3* 8.7 8.6  MG -- -- -- --  PHOS -- -- -- --   CBC:  Lab 10/16/11 0600 10/15/11 1017 10/14/11 0430 10/12/11 0355 10/11/11 0941  WBC -- 14.4* 15.6* 12.2* 13.8*  NEUTROABS 6.4 -- -- -- 11.3*  HGB -- 13.2 14.4 13.9 14.7  HCT -- 38.9 42.7 41.1 43.5  MCV -- 94.6 94.5 94.3 94.6  PLT -- 250 265 243 262   Cardiac Enzymes:  Lab 10/15/11 0903 10/13/11 0630  CKTOTAL 48 51  CKMB 3.8 2.8  CKMBINDEX -- --  TROPONINI <0.30 <0.30   CBG:  Lab 10/16/11 0840  GLUCAP 138*   Scheduled Meds:   . amLODipine  10 mg Oral Daily  . aspirin  162 mg Oral Daily  . clopidogrel  75 mg Oral Once  . docusate sodium  100 mg Oral BID  . hydrochlorothiazide  25 mg Oral Daily  . ketorolac  15 mg Intravenous  Once  . levothyroxine  100 mcg Oral QAC breakfast  . losartan  100 mg Oral Daily  . metoprolol tartrate  12.5 mg Oral BID  . pantoprazole  40 mg Oral BID AC  . PARoxetine  40 mg Oral Q0700  . polyethylene glycol  17 g Oral BID  . potassium chloride  40 mEq Oral Daily  . rosuvastatin  20 mg Oral q1800   Continuous Infusions:   . sodium chloride Stopped (10/14/11 1600)     Assessment/Plan: 1. Accelerated hypertension: Continue Norvasc and Cozaar. Goal systolic blood pressure 120-140. Metoprolol restarted yesterday. Can certainly increase the Toprol as  needed. 2. Hyperglycemia: Resolved. Thought secondary to steroids. No history of diabetes. 3. History of coronary artery disease: Stable. Per cardiology. 4. History of hypothyroidism: Stable. Continue current treatment. 5. Constipation: Stool softeners and MiraLAX. 6. DVT prophylaxis: Bilateral lower extremity SCDs.  Rest of the patient's active issues will be managed by the interventional radiology team, neurology and neurosurgery (obstructive hydrocephalus, intraventricular hemorrhage)  Will reevaluate in the morning. Blood pressure appears to be stabilizing.  Brendia Sacks, MD  [facility] Pager [phone] 10/17/2011, 3:16 PM   LOS: 12 days

## 2011-10-17 NOTE — Progress Notes (Signed)
BP 136/69  Pulse 58  Temp(Src) 98.6 F (37 C) (Oral)  Resp 19  Ht 5\' 7"  (1.702 m)  Wt 66.8 kg (147 lb 4.3 oz)  BMI 23.07 kg/m2  SpO2 97% No neurologic changes Alert oriented x4, speech clear and fluent 5/5 strength Peerl, symmetric facies Tongue and uvula midline. A/P  Will clamp ventric in am.

## 2011-10-17 NOTE — Progress Notes (Signed)
Pt is asleep.  BP is well controlled.  No cardiac issues.  Will sign off. Call for question.  Vesta Mixer, Montez Hageman., MD, [facility] 10/17/2011, 7:33 AM

## 2011-10-17 NOTE — Progress Notes (Signed)
CSW received a consult for possible SNF. CSW met with pt to discuss consult. Currently, PT has not worked with pt, therefore there are no recommendations. CSW will await PT's recommendation discharge plans. CSW will continue to follow pt to address discharge needs.   Dede Query, MSW, [facility] [phone]

## 2011-10-17 NOTE — Progress Notes (Signed)
11 Days Post-Op  Subjective: Patient reports mild pressure behind left eye. No other issues at this time.  Objective: Vital signs in last 24 hours: Temp:  [98.3 F (36.8 C)] 98.3 F (36.8 C) (12/11 0700) Pulse Rate:  [55-85] 62  (12/11 0900) Resp:  [11-25] 18  (12/11 0900) BP: (106-167)/(56-82) 143/82 mmHg (12/11 0900) SpO2:  [93 %-97 %] 95 % (12/11 0900) Last BM Date: 10/16/11  Intake/Output from previous day: 12/10 0701 - 12/11 0700 In: 1320 [P.O.:1320] Out: 3290 [Urine:3200; Drains:90] Intake/Output this shift: Total I/O In: 195 [P.O.:195] Out: 419 [Urine:400; Drains:19]  Exam per Dr. Corliss Skains: Pt alert and oriented except to day of week.. Visual fields intact. Follows commands. Moves all extremities.  Lab Results:   Basename 10/15/11 1017  WBC 14.4*  HGB 13.2  HCT 38.9  PLT 250   BMET  Basename 10/16/11 0600 10/15/11 0345  NA 136 138  K 4.2 4.2  CL 104 109  CO2 24 21  GLUCOSE 112* 120*  BUN 13 14  CREATININE 0.79 0.78  CALCIUM 8.6 8.3*   PT/INR No results found for this basename: LABPROT:2,INR:2 in the last 72 hours ABG No results found for this basename: PHART:2,PCO2:2,PO2:2,HCO3:2 in the last 72 hours  Studies/Results: No results found.  Anti-infectives: Anti-infectives     Start     Dose/Rate Route Frequency Ordered Stop   10/06/11 2200   vancomycin (VANCOCIN) IVPB 1000 mg/200 mL premix  Status:  Discontinued     Comments: ceFAZolin (ANCEF) IVPB 1 g/50 mL premix (g) 1 g Given 10/06/11 1840       1,000 mg 200 mL/hr over 60 Minutes Intravenous Every 12 hours 10/06/11 2056 10/15/11 1058   10/06/11 1804   ceFAZolin (ANCEF) 1-5 GM-% IVPB     Comments: MCMILLEN, MIKE: cabinet override         10/06/11 1804 10/07/11 0614   10/05/11 1600   vancomycin (VANCOCIN) IVPB 1000 mg/200 mL premix        1,000 mg 200 mL/hr over 60 Minutes Intravenous  Once 10/05/11 1414 10/05/11 1725   10/05/11 0945   vancomycin (VANCOCIN) IVPB 1000 mg/200 mL premix          1,000 mg 200 mL/hr over 60 Minutes Intravenous  Once 10/05/11 0930 10/05/11 1017          Assessment/Plan: s/p Procedure(s): VENTRICULOSTOMY S/P Stent assisted coiling of basilar artery apex aneurysm 10/05/11 Dr. Corliss Skains.  LOS: 12 days    Jill [name] 10/17/2011

## 2011-10-18 MED ORDER — HYDROCORTISONE 1 % EX OINT
TOPICAL_OINTMENT | Freq: Three times a day (TID) | CUTANEOUS | Status: AC
  Administered 2011-10-18 – 2011-10-19 (×5): via TOPICAL
  Administered 2011-10-20: 1 via TOPICAL
  Administered 2011-10-20: 17:00:00 via TOPICAL
  Administered 2011-10-21: 1 via TOPICAL
  Filled 2011-10-18: qty 28.35

## 2011-10-18 MED ORDER — CLOPIDOGREL BISULFATE 75 MG PO TABS
75.0000 mg | ORAL_TABLET | Freq: Once | ORAL | Status: AC
  Administered 2011-10-18: 75 mg via ORAL
  Filled 2011-10-18: qty 1

## 2011-10-18 MED ORDER — CLOPIDOGREL BISULFATE 75 MG PO TABS: 75.0000 mg | ORAL_TABLET | Freq: Once | ORAL | Status: DC

## 2011-10-18 MED ORDER — METOPROLOL TARTRATE 25 MG PO TABS
25.0000 mg | ORAL_TABLET | Freq: Two times a day (BID) | ORAL | Status: DC
  Administered 2011-10-18: 25 mg via ORAL
  Filled 2011-10-18 (×4): qty 1

## 2011-10-18 NOTE — Progress Notes (Signed)
12 Days Post-Op  Subjective: Doing much better.  Less headaches.  Denies visual changes, nausea. States no further pressure behind left eye.  C/O sores to left of face - like acne with itching.  Started yesterday.   Objective: Vital signs in last 24 hours: Temp:  [97.9 F (36.6 C)-98.7 F (37.1 C)] 98.3 F (36.8 C) (12/12 0800) Pulse Rate:  [58-82] 66  (12/12 1000) Resp:  [13-23] 21  (12/12 1000) BP: (99-156)/(58-85) 144/70 mmHg (12/12 1000) SpO2:  [94 %-99 %] 98 % (12/12 1000) Last BM Date: 10/17/11  Intake/Output from previous day: 12/11 0701 - 12/12 0700 In: 530 [P.O.:530] Out: 3234 [Urine:3025; Drains:209] Intake/Output this shift: Total I/O In: 240 [P.O.:240] Out: -   Physical exam :  A&O x 3. Clear speech with no facial asymmetry.  Moving all four extremities equally with motor function 5/5 of all four extremities.  Ventric drain intact - clamped. Blood tinged - amber colored fluid in bag.  Small lesions on left side of face with no purulence noted.  There are approximately 20 small lesions, all flesh colored without erythema - patient has been laying on hair on that side of her face - question reactive - will try topical steroid cream to assist with itching.   Lab Results:  No results found for this basename: WBC:2,HGB:2,HCT:2,PLT:2 in the last 72 hours BMET  Basename 10/16/11 0600  NA 136  K 4.2  CL 104  CO2 24  GLUCOSE 112*  BUN 13  CREATININE 0.79  CALCIUM 8.6   PT/INR No results found for this basename: LABPROT:2,INR:2 in the last 72 hours ABG No results found for this basename: PHART:2,PCO2:2,PO2:2,HCO3:2 in the last 72 hours  Studies/Results: No results found.  Anti-infectives: Anti-infectives     Start     Dose/Rate Route Frequency Ordered Stop   10/06/11 2200   vancomycin (VANCOCIN) IVPB 1000 mg/200 mL premix  Status:  Discontinued     Comments: ceFAZolin (ANCEF) IVPB 1 g/50 mL premix (g) 1 g Given 10/06/11 1840       1,000 mg 200 mL/hr over 60  Minutes Intravenous Every 12 hours 10/06/11 2056 10/15/11 1058   10/06/11 1804   ceFAZolin (ANCEF) 1-5 GM-% IVPB     Comments: MCMILLEN, MIKE: cabinet override         10/06/11 1804 10/07/11 0614   10/05/11 1600   vancomycin (VANCOCIN) IVPB 1000 mg/200 mL premix        1,000 mg 200 mL/hr over 60 Minutes Intravenous  Once 10/05/11 1414 10/05/11 1725   10/05/11 0945   vancomycin (VANCOCIN) IVPB 1000 mg/200 mL premix        1,000 mg 200 mL/hr over 60 Minutes Intravenous  Once 10/05/11 0930 10/05/11 1017          Assessment/Plan: s/p Procedure(s):j basilar aneurysm coiling/stent placement followed by VENTRICULOSTOMY.  Patient neurologically remains stable, HTN under better control and symptoms improving.   Less headaches today - will see how patient tolerates ventric clamping and follow.  Increase activity over next few days if patient continues to do well.    LOS: 13 days    Landen Knoedler D 10/18/2011

## 2011-10-18 NOTE — Progress Notes (Signed)
Discussed in the long length of stay meeting Donyell Carrell Weeks 10/18/2011  

## 2011-10-18 NOTE — Progress Notes (Signed)
Subjective: Patient reports no change since yesterday  Objective: Vital signs in last 24 hours: Temp:  [98.1 F (36.7 C)-98.7 F (37.1 C)] 98.4 F (36.9 C) (12/12 1957) Pulse Rate:  [49-82] 54  (12/12 2200) Resp:  [15-22] 16  (12/12 2200) BP: (99-156)/(49-115) 132/68 mmHg (12/12 2200) SpO2:  [94 %-98 %] 97 % (12/12 2200)  Intake/Output from previous day: 12/11 0701 - 12/12 0700 In: 530 [P.O.:530] Out: 3234 [Urine:3025; Drains:209] Intake/Output this shift: Total I/O In: 240 [P.O.:240] Out: 350 [Urine:350]  Alert oriented x4 speech clear and fluent. Peerl, full eom Symmetric facies, tongue and uvula midline 5/5 strength ventric clAmped this morning. No change in headache.  Lab Results: No results found for this basename: WBC:2,HGB:2,HCT:2,PLT:2 in the last 72 hours BMET  Basename 10/16/11 0600  NA 136  K 4.2  CL 104  CO2 24  GLUCOSE 112*  BUN 13  CREATININE 0.79  CALCIUM 8.6    Studies/Results: No results found.  Assessment/Plan: Check CT in am   LOS: 13 days     Sheritta Deeg L 10/18/2011, 10:34 PM

## 2011-10-18 NOTE — Progress Notes (Signed)
CONSULT FOLLOW-UP NOTE  Jill [name] YQM:[id number] DOB: 1951/07/12 DOA: 10/05/2011 PCP: Elyn Aquas., MD, MD Requesting physician: Dr. Corliss Skains  Brief narrative: This is a 60 year old, Caucasian female, with a past medical history of CAD, hypertension, hypothyroidism, who had a single, presyncopal event, and presented to the emergency department. 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 She had been complaining of headaches prior to that. So, the patient underwent angiogram and coiling of this aneurysm and a stent placement. This was done on November 29. This procedure was complicated by intraventricular hemorrhage. Patient required neurosurgical intervention and intraventricular catheter placement. This has been draining a bloody drainage. Patient has been monitored in the neurological intensive care unit for the last many days. Patient has been having headaches for the past few days, which has been evaluated with serial CAT scans. Neurosurgery, neurology, and Dr. Corliss Skains has been following the patient on daily basis. We were called today because of blood pressure issues. We were also asked to manage her hyperglycemia.  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 Franky Macho)  Procedures:  November 29: Basilar artery aneurysm stent/coil  November 30: Frontal intraventricular catheter placement.  Interim History: Documentation reviewed. Subjective: Feels okay today.  Objective:  Filed Vitals:   10/18/11 0400 10/18/11 0500 10/18/11 0600 10/18/11 0700  BP: 139/82 147/80 150/85 142/78  Pulse: 66 62 70 62  Temp:      TempSrc:      Resp: 17 19 15 17   Height:      Weight:      SpO2: 95% 95% 96% 96%    Intake/Output Summary (Last 24 hours) at 10/18/11 0803 Last data  filed at 10/18/11 0700  Gross per 24 hour  Intake    455 ml  Output   2825 ml  Net  -2370 ml    Exam: General: Appears well. Cardiovascular: Regular rate and rhythm. No murmur, rub or gallop. No lower extremity edema. Respiratory: Clear to auscultation bilaterally. No wheezes, rales or rhonchi. Normal respiratory effort.  Exam remains current December 12.  Scheduled Meds:    . amLODipine  10 mg Oral Daily  . aspirin  162 mg Oral Daily  . clopidogrel  75 mg Oral Once  . docusate sodium  100 mg Oral BID  . hydrochlorothiazide  25 mg Oral Daily  . ketorolac  15 mg Intravenous Once  . levothyroxine  100 mcg Oral QAC breakfast  . losartan  100 mg Oral Daily  . metoprolol tartrate  12.5 mg Oral BID  . pantoprazole  40 mg Oral BID AC  . PARoxetine  40 mg Oral Q0700  . polyethylene glycol  17 g Oral BID  . potassium chloride  40 mEq Oral Daily  . rosuvastatin  20 mg Oral q1800   Continuous Infusions:    . sodium chloride Stopped (10/14/11 1600)     Assessment/Plan: 1. Accelerated hypertension: Resolved status post Cardene infusion.  2. Hypertension: Control fair. Goal systolic blood pressure 120-140. Continue Norvasc and Cozaar. Increase metoprolol to 25 mg twice daily. Patient reports a history of elevated blood pressures.  3. Hyperglycemia: Resolved. Thought secondary to steroids. No history of diabetes. 4. History of coronary artery disease: Stable. Per cardiology. 5. History of hypothyroidism: Stable. Continue current treatment. 6. Constipation: Resolved. Continue stool softeners and MiraLAX. 7. DVT prophylaxis: Bilateral  lower extremity SCDs.  Rest of the patient's active issues will be managed by the interventional radiology team, neurology and neurosurgery (obstructive hydrocephalus, intraventricular hemorrhage)  Will reevaluate in the morning. Blood pressure appears to be stabilizing.  Brendia Sacks, MD  [facility] Pager [phone] 10/18/2011,  8:03 AM   LOS: 13 days

## 2011-10-19 ENCOUNTER — Inpatient Hospital Stay (HOSPITAL_COMMUNITY): Payer: Medicaid Other

## 2011-10-19 ENCOUNTER — Encounter (HOSPITAL_COMMUNITY): Payer: Self-pay

## 2011-10-19 LAB — COMPREHENSIVE METABOLIC PANEL
ALT: 36 U/L — ABNORMAL HIGH (ref 0–35)
AST: 20 U/L (ref 0–37)
Albumin: 3.7 g/dL (ref 3.5–5.2)
Alkaline Phosphatase: 57 U/L (ref 39–117)
Potassium: 4.2 mEq/L (ref 3.5–5.1)
Sodium: 136 mEq/L (ref 135–145)
Total Protein: 6.9 g/dL (ref 6.0–8.3)

## 2011-10-19 MED ORDER — METOPROLOL TARTRATE 12.5 MG HALF TABLET
12.5000 mg | ORAL_TABLET | Freq: Two times a day (BID) | ORAL | Status: DC
  Administered 2011-10-19 – 2011-10-22 (×6): 12.5 mg via ORAL
  Filled 2011-10-19 (×8): qty 1

## 2011-10-19 NOTE — Progress Notes (Signed)
13 Days Post-Op  Subjective: Clinically stable Occasional 2/10 RT frontal HA.,responsive to tylenol. Repeat CT resolving IVH,with mild hydrocephalus.. Objective: Vital signs in last 24 hours: Temp:  [98.2 F (36.8 C)-98.4 F (36.9 C)] 98.4 F (36.9 C) (12/12 1957) Pulse Rate:  [49-68] 60  (12/13 1000) Resp:  [14-22] 18  (12/13 1000) BP: (109-155)/(49-90) 139/68 mmHg (12/13 1000) SpO2:  [95 %-98 %] 98 % (12/13 1000) Last BM Date: 10/17/11  Intake/Output from previous day: 12/12 0701 - 12/13 0700 In: 840 [P.O.:840] Out: 1551 [Urine:1551] Intake/Output this shift: Total I/O In: -  Out: 23 [Drains:23]  Exam. Stable unchanged neurological exam.. EVD 7cc/hr CSF  blood tinged.  Lab Results:  No results found for this basename: WBC:2,HGB:2,HCT:2,PLT:2 in the last 72 hours BMET No results found for this basename: NA:2,K:2,CL:2,CO2:2,GLUCOSE:2,BUN:2,CREATININE:2,CALCIUM:2 in the last 72 hours PT/INR No results found for this basename: LABPROT:2,INR:2 in the last 72 hours ABG No results found for this basename: PHART:2,PCO2:2,PO2:2,HCO3:2 in the last 72 hours  Studies/Results: Ct Head Wo Contrast  10/19/2011  *RADIOLOGY REPORT*  Clinical Data: Evaluate the ventricles.  Catheter planned for 24 hours.  CT HEAD WITHOUT CONTRAST  Technique:  Contiguous axial images were obtained from the base of the skull through the vertex without contrast.  Comparison: 10/13/2011.  Findings: Ventriculostomy enters through the right frontal region with the tip at the level of the right frontal horn. Mild edema and minimal blood breakdown products noted along the course of the right frontal ventriculostomy.  Post coiling and stenting the basilar artery with streak artifact.  Linear structure within the left vertebral artery and basilar artery may be related to stenting.  Ventricular prominence.  The ventricles appear slightly more prominent than on the prior examination (as best appreciated involving the  temporal horns).  Decrease in density of interventricular blood.  No large acute thrombotic infarct.  IMPRESSION: Slight increase in size of ventricles.  Please see above.  Original Report Authenticated By: Fuller Canada, M.D.    Anti-infectives: Anti-infectives     Start     Dose/Rate Route Frequency Ordered Stop   10/06/11 2200   vancomycin (VANCOCIN) IVPB 1000 mg/200 mL premix  Status:  Discontinued     Comments: ceFAZolin (ANCEF) IVPB 1 g/50 mL premix (g) 1 g Given 10/06/11 1840       1,000 mg 200 mL/hr over 60 Minutes Intravenous Every 12 hours 10/06/11 2056 10/15/11 1058   10/06/11 1804   ceFAZolin (ANCEF) 1-5 GM-% IVPB     Comments: MCMILLEN, MIKE: cabinet override         10/06/11 1804 10/07/11 0614   10/05/11 1600   vancomycin (VANCOCIN) IVPB 1000 mg/200 mL premix        1,000 mg 200 mL/hr over 60 Minutes Intravenous  Once 10/05/11 1414 10/05/11 1725   10/05/11 0945   vancomycin (VANCOCIN) IVPB 1000 mg/200 mL premix        1,000 mg 200 mL/hr over 60 Minutes Intravenous  Once 10/05/11 0930 10/05/11 1017          Assessment/Plan: s/p Procedure(s): VENTRICULOSTOMY Plan. Per NES recs.. Otherwise as is.  Sirius Woodford K 10/19/2011

## 2011-10-19 NOTE — Progress Notes (Signed)
CONSULT FOLLOW-UP NOTE  Jill [name] [id number]:[id number] DOB: 09/15/1951 DOA: 10/05/2011 PCP: Elyn Aquas., MD, MD Requesting physician: Dr. Corliss Skains  Brief narrative: This is a 60 year old, Caucasian female, with a past medical history of CAD, hypertension, hypothyroidism, who had a single, presyncopal event, and presented to the emergency department. 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 She had been complaining of headaches prior to that. So, the patient underwent angiogram and coiling of this aneurysm and a stent placement. This was done on November 29. This procedure was complicated by intraventricular hemorrhage. Patient required neurosurgical intervention and intraventricular catheter placement. This has been draining a bloody drainage. Patient has been monitored in the neurological intensive care unit for the last many days. Patient has been having headaches for the past few days, which has been evaluated with serial CAT scans. Neurosurgery, neurology, and Dr. Corliss Skains has been following the patient on daily basis. We were called today because of blood pressure issues. We were also asked to manage her hyperglycemia.  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 Franky Macho)  Procedures:  November 29: Basilar artery aneurysm stent/coil  November 30: Frontal intraventricular catheter placement.  Interim History: Interval documentation reviewed. Systolic blood pressure ranges 120s to 140s. Diastolic blood pressure well-controlled. Atenolol was held last night secondary bradycardia. Subjective: Feels okay today.  Objective:  Filed Vitals:   10/19/11 0300 10/19/11 0400 10/19/11 0500 10/19/11 0600  BP: 144/68 144/75 148/79 141/74  Pulse: 52 56 68 50  Temp:      TempSrc:       Resp: 17 21 20 17   Height:      Weight:      SpO2: 96% 97% 98% 95%    Intake/Output Summary (Last 24 hours) at 10/19/11 0833 Last data filed at 10/19/11 0500  Gross per 24 hour  Intake    600 ml  Output   1551 ml  Net   -951 ml    Exam: General: Appears well. Cardiovascular: Regular rate and rhythm. No murmur, rub or gallop. No lower extremity edema. Respiratory: Clear to auscultation bilaterally. No wheezes, rales or rhonchi. Normal respiratory effort.  Exam remains current December 13.  Scheduled Meds:    . amLODipine  10 mg Oral Daily  . aspirin  162 mg Oral Daily  . docusate sodium  100 mg Oral BID  . hydrochlorothiazide  25 mg Oral Daily  . hydrocortisone   Topical TID  . ketorolac  15 mg Intravenous Once  . levothyroxine  100 mcg Oral QAC breakfast  . losartan  100 mg Oral Daily  . metoprolol tartrate  12.5 mg Oral BID  . pantoprazole  40 mg Oral BID AC  . PARoxetine  40 mg Oral Q0700  . polyethylene glycol  17 g Oral BID  . potassium chloride  40 mEq Oral Daily  . rosuvastatin  20 mg Oral q1800  . DISCONTD: metoprolol tartrate  25 mg Oral BID   Continuous Infusions:    . sodium chloride Stopped (10/14/11 1600)     Assessment/Plan: 1. Accelerated hypertension: Resolved status post Cardene infusion.  2. Hypertension: Control fair. Goal systolic blood pressure 120-140. Norvasc, hydrochlorothiazide and Cozaar at maximum dose. Blood pressure is fairly well controlled yesterday however metoprolol was held last night secondary bradycardia. Will decrease her metoprolol dose today. It does appear  that if she receives her metoprolol for her blood pressure remains within acceptable limits. Patient reports a history of poorly-controlled hypertension.  3. Hyperglycemia: Resolved. Thought secondary to steroids. No history of diabetes. 4. History of coronary artery disease: Stable. Per cardiology. 5. History of hypothyroidism: Stable. Continue current  treatment. 6. Constipation: Resolved. Continue stool softeners and MiraLAX. 7. DVT prophylaxis: Bilateral lower extremity SCDs.  Rest of the patient's active issues will be managed by the interventional radiology team, neurology and neurosurgery (obstructive hydrocephalus, intraventricular hemorrhage)  Will continue to follow.  Brendia Sacks, MD  [facility] Pager [phone] 10/19/2011, 8:33 AM   LOS: 14 days

## 2011-10-19 NOTE — Progress Notes (Signed)
Patient ID: ADWOA AXE, female   DOB: 1951-08-28, 60 y.o.   MRN: [id number] BP 139/68  Pulse 58  Temp(Src) 98.5 F (36.9 C) (Oral)  Resp 18  Ht 5\' 7"  (1.702 m)  Wt 66.8 kg (147 lb 4.3 oz)  BMI 23.07 kg/m2  SpO2 98% Alert and orientedx4, speech clear and fluent Peerl, full eom Symmetric facies, tongue and uvula midline 5/5 strength all extremities ICP measured at 7cm h20 I removed the ventricular catheter and placed a sterile dressing. Possibility of serial LP's if bad headache.  CT showed much of the blood had resolved. Although the radilogist felt the ventricles were slightly bigger, I did not see any real difference. She did well yesterday with the ventric clamped.

## 2011-10-20 MED ORDER — CLOPIDOGREL BISULFATE 75 MG PO TABS
75.0000 mg | ORAL_TABLET | Freq: Once | ORAL | Status: AC
  Administered 2011-10-20: 75 mg via ORAL
  Filled 2011-10-20: qty 1

## 2011-10-20 NOTE — Progress Notes (Signed)
CONSULT FOLLOW-UP NOTE  Jill [name] ZOX:[id number] DOB: 10/20/1951 DOA: 10/05/2011 PCP: Elyn Aquas., MD, MD Requesting physician: Dr. Corliss Skains  Brief narrative: This is a 60 year old, Caucasian female, with a past medical history of CAD, hypertension, hypothyroidism, who had a single, presyncopal event, and presented to the emergency department. 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 She had been complaining of headaches prior to that. So, the patient underwent angiogram and coiling of this aneurysm and a stent placement. This was done on November 29. This procedure was complicated by intraventricular hemorrhage. Patient required neurosurgical intervention and intraventricular catheter placement. This has been draining a bloody drainage. Patient has been monitored in the neurological intensive care unit for the last many days. Patient has been having headaches for the past few days, which has been evaluated with serial CAT scans. Neurosurgery, neurology, and Dr. Corliss Skains has been following the patient on daily basis. We were called today because of blood pressure issues. We were also asked to manage her hyperglycemia.  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 Franky Macho)  Procedures:  November 29: Basilar artery aneurysm stent/coil  November 30: Frontal intraventricular catheter placement.  Interim History: Documentation reviewed. No apparent new issues. Subjective: Feels okay today.  Objective:  Filed Vitals:   10/19/11 2137 10/20/11 0000 10/20/11 0400 10/20/11 0546  BP: 139/80     Pulse: 78     Temp:  97.9 F (36.6 C) 98.2 F (36.8 C)   TempSrc:  Oral Oral   Resp:      Height:      Weight:    65.4 kg (144 lb 2.9 oz)  SpO2:        Intake/Output Summary  (Last 24 hours) at 10/20/11 0753 Last data filed at 10/20/11 0600  Gross per 24 hour  Intake    380 ml  Output    408 ml  Net    -28 ml    Exam: General: Appears well. Cardiovascular: Regular rate and rhythm. No murmur, rub or gallop.  Respiratory: Clear to auscultation bilaterally. No wheezes, rales or rhonchi. Normal respiratory effort.  Exam remains current December 14.  Scheduled Meds:    . amLODipine  10 mg Oral Daily  . aspirin  162 mg Oral Daily  . docusate sodium  100 mg Oral BID  . hydrochlorothiazide  25 mg Oral Daily  . hydrocortisone   Topical TID  . ketorolac  15 mg Intravenous Once  . levothyroxine  100 mcg Oral QAC breakfast  . losartan  100 mg Oral Daily  . metoprolol tartrate  12.5 mg Oral BID  . pantoprazole  40 mg Oral BID AC  . PARoxetine  40 mg Oral Q0700  . polyethylene glycol  17 g Oral BID  . potassium chloride  40 mEq Oral Daily  . rosuvastatin  20 mg Oral q1800  . DISCONTD: metoprolol tartrate  25 mg Oral BID   Continuous Infusions:    . sodium chloride Stopped (10/14/11 1600)     Assessment/Plan: 1. Accelerated hypertension: Resolved status post Cardene infusion.  2. Hypertension: Good control. Goal systolic blood pressure 120-140. Continue Norvasc and Cozaar. Continue metoprolol at current dose. Patient reports a history of elevated blood pressures.  3. Hyperglycemia: Resolved. Thought secondary to steroids. No history of diabetes. 4. History of coronary artery disease: Stable.  Per cardiology. 5. History of hypothyroidism: Stable. Continue current treatment. 6. Constipation: Resolved. Continue stool softeners and MiraLAX. 7. DVT prophylaxis: Bilateral lower extremity SCDs.  Rest of the patient's active issues will be managed by the interventional radiology team, neurology and neurosurgery (obstructive hydrocephalus, intraventricular hemorrhage)  Blood pressures appear to be a goal as recommended by primary service. Will sign off. Please  call if I can be of further assistance.  Brendia Sacks, MD  [facility] Pager [phone] 10/20/2011, 7:53 AM   LOS: 15 days

## 2011-10-20 NOTE — Progress Notes (Signed)
Subjective: Patient reports Feeling better but still has a headache. Asking to go home if possible  Objective: Vital signs in last 24 hours: Temp:  [97.7 F (36.5 C)-98.4 F (36.9 C)] 98.4 F (36.9 C) (12/14 1623) Pulse Rate:  [59-86] 66  (12/14 1800) Resp:  [13-21] 20  (12/14 1800) BP: (115-142)/(59-85) 117/59 mmHg (12/14 1800) SpO2:  [95 %-98 %] 95 % (12/14 1800) Weight:  [65.4 kg (144 lb 2.9 oz)] 144 lb 2.9 oz (65.4 kg) (12/14 0546)  Intake/Output from previous day: 12/13 0701 - 12/14 0700 In: 380 [P.O.:380] Out: 408 [Urine:375; Drains:33] Intake/Output this shift: Total I/O In: 600 [P.O.:600] Out: 1150 [Urine:1150]  Neurologic: Mental status: Alert, oriented, thought content appropriate Cranial nerves: II: pupils equal, round, reactive to light and accommodation, III,IV,VI: extraocular muscles extra-ocular motions intact, VII: upper facial muscle function normal bilaterally, VII: lower facial muscle function normal bilaterally, IX: soft palate elevation normal bilaterally, XI: sternocleidomastoid strength normal bilaterally, XII: tongue strength normal  Motor: Normal Gait: Normal  Lab Results: No results found for this basename: WBC:2,HGB:2,HCT:2,PLT:2 in the last 72 hours BMET  [facility] 10/19/11 1812  NA 136  K 4.2  CL 98  CO2 27  GLUCOSE 110*  BUN 17  CREATININE 0.97  CALCIUM 9.6    Studies/Results: Ct Head Wo Contrast  10/19/2011  *RADIOLOGY REPORT*  Clinical Data: Evaluate the ventricles.  Catheter planned for 24 hours.  CT HEAD WITHOUT CONTRAST  Technique:  Contiguous axial images were obtained from the base of the skull through the vertex without contrast.  Comparison: 10/13/2011.  Findings: Ventriculostomy enters through the right frontal region with the tip at the level of the right frontal horn. Mild edema and minimal blood breakdown products noted along the course of the right frontal ventriculostomy.  Post coiling and stenting the basilar artery with  streak artifact.  Linear structure within the left vertebral artery and basilar artery may be related to stenting.  Ventricular prominence.  The ventricles appear slightly more prominent than on the prior examination (as best appreciated involving the temporal horns).  Decrease in density of interventricular blood.  No large acute thrombotic infarct.  IMPRESSION: Slight increase in size of ventricles.  Please see above.  Original Report Authenticated By: Fuller Canada, M.D.    Assessment/Plan: Doing better. Will transfer to floor. Possible d/c. No neurologic problems.  LOS: 15 days     Candy Leverett L 10/20/2011, 6:45 PM

## 2011-10-20 NOTE — Progress Notes (Signed)
Utilization review completed. Anette Guarneri, RN, BSN. 10/19/14

## 2011-10-20 NOTE — Progress Notes (Signed)
CSW met with pt. PT has not met with pt to make discharge recommendations. Pt shared that she plans to discharge home tomorrow with no services. CSW will continue to follow during this admission to assess discharge needs.   Dede Query, MSW, Theresia Majors [phone]

## 2011-10-20 NOTE — Progress Notes (Signed)
14 Days Post-Op  Subjective: C/O  A 4/10 HA ,rt frontal alleviated by tylenol.. Denies photophobia.,or other neuro or systemic symptoms. No further leg cramping..  Objective: Vital signs in last 24 hours: Temp:  [97.3 F (36.3 C)-98.5 F (36.9 C)] 98.2 F (36.8 C) (12/14 0400) Pulse Rate:  [58-86] 78  (12/14 1000) Resp:  [14-22] 21  (12/14 1000) BP: (120-142)/(55-85) 133/69 mmHg (12/14 1000) SpO2:  [95 %-98 %] 97 % (12/14 1000) Weight:  [144 lb 2.9 oz (65.4 kg)] 144 lb 2.9 oz (65.4 kg) (12/14 0546) Last BM Date: 10/17/11  Intake/Output from previous day: 12/13 0701 - 12/14 0700 In: 380 [P.O.:380] Out: 408 [Urine:375; Drains:33] Intake/Output this shift: Total I/O In: 300 [P.O.:300] Out: 600 [Urine:600]  On Exam  In no acute distress.. Alert, awake oriented to time ,place and space.Marland Kitchen  Speech and comprehension clear.Marland Kitchen  PEARLA.Marland Kitchen  EOMS full.Jill [place].  No Facial asymmetry.  Tongue Midline..  Motor..           NO drift of outstretched arms..          Power.5/5 Proximally and distally all four extremities..  Fine motor and coordination to finger to nose equal..  Lab Results:  No results found for this basename: WBC:2,HGB:2,HCT:2,PLT:2 in the last 72 hours BMET  [facility] 10/19/11 1812  NA 136  K 4.2  CL 98  CO2 27  GLUCOSE 110*  BUN 17  CREATININE 0.97  CALCIUM 9.6   PT/INR No results found for this basename: LABPROT:2,INR:2 in the last 72 hours ABG No results found for this basename: PHART:2,PCO2:2,PO2:2,HCO3:2 in the last 72 hours  Studies/Results: Ct Head Wo Contrast  10/19/2011  *RADIOLOGY REPORT*  Clinical Data: Evaluate the ventricles.  Catheter planned for 24 hours.  CT HEAD WITHOUT CONTRAST  Technique:  Contiguous axial images were obtained from the base of the skull through the vertex without contrast.  Comparison: 10/13/2011.  Findings: Ventriculostomy enters through the right frontal region with the tip at the level of the right  frontal horn. Mild edema and minimal blood breakdown products noted along the course of the right frontal ventriculostomy.  Post coiling and stenting the basilar artery with streak artifact.  Linear structure within the left vertebral artery and basilar artery may be related to stenting.  Ventricular prominence.  The ventricles appear slightly more prominent than on the prior examination (as best appreciated involving the temporal horns).  Decrease in density of interventricular blood.  No large acute thrombotic infarct.  IMPRESSION: Slight increase in size of ventricles.  Please see above.  Original Report Authenticated By: Fuller Canada, M.D.    Anti-infectives: Anti-infectives     Start     Dose/Rate Route Frequency Ordered Stop   10/06/11 2200   vancomycin (VANCOCIN) IVPB 1000 mg/200 mL premix  Status:  Discontinued     Comments: ceFAZolin (ANCEF) IVPB 1 g/50 mL premix (g) 1 g Given 10/06/11 1840       1,000 mg 200 mL/hr over 60 Minutes Intravenous Every 12 hours 10/06/11 2056 10/15/11 1058   10/06/11 1804   ceFAZolin (ANCEF) 1-5 GM-% IVPB     Comments: MCMILLEN, MIKE: cabinet override         10/06/11 1804 10/07/11 0614   10/05/11 1600   vancomycin (VANCOCIN) IVPB 1000 mg/200 mL premix        1,000 mg 200 mL/hr over 60 Minutes Intravenous  Once 10/05/11 1414 10/05/11 1725   10/05/11 0945   vancomycin (VANCOCIN) IVPB 1000  mg/200 mL premix        1,000 mg 200 mL/hr over 60 Minutes Intravenous  Once 10/05/11 0930 10/05/11 1017          Assessment/Plan: s/p Procedure(s): VENTRICULOSTOMY Plan.. 1.. Ambulate with assistance. 2. May sit out in chair as tolerated.. 3. May DC in am,if clinically stable. D/W patient.  Georgian Mcclory K 10/20/2011

## 2011-10-21 NOTE — Progress Notes (Signed)
15 Days Post-Op  Subjective: C/O1 to 2/10  Rt frontal-parietal HA. Relieved with tylenol.. Eating . Sitting out and ambulating ,though gets tired. No other neurologic or systemic complaints.  Objective: Vital signs in last 24 hours: Temp:  [97 F (36.1 C)-98.4 F (36.9 C)] 97 F (36.1 C) (12/15 0000) Pulse Rate:  [53-78] 57  (12/15 0700) Resp:  [13-21] 18  (12/15 0700) BP: (115-149)/(46-86) 123/73 mmHg (12/15 0700) SpO2:  [95 %-98 %] 97 % (12/15 0700) Weight:  [143 lb 4.8 oz (65 kg)] 143 lb 4.8 oz (65 kg) (12/14 2000) Last BM Date: 10/19/11  Intake/Output from previous day: 12/14 0701 - 12/15 0700 In: 840 [P.O.:840] Out: 1150 [Urine:1150] Intake/Output this shift:    On Exam  VS BP in the 120s to 140s.HR 50 - 75 SR Pao95% RA.  Neuro. Alert, awake oriented to time ,place and space.Marland Kitchen  Speech and comprehension clear.Marland Kitchen  PEARLA 3mm..  EOMS full.Jill Alexanders Fields.  No Facial asymmetry.  Tongue Midline..  Motor..           NO drift of outstretched arms..          Power.5/5 Proximally and distally all four extremities..  Fine motor and coordination to finger to nose equal..  Lab Results:  No results found for this basename: WBC:2,HGB:2,HCT:2,PLT:2 in the last 72 hours BMET  [facility] 10/19/11 1812  NA 136  K 4.2  CL 98  CO2 27  GLUCOSE 110*  BUN 17  CREATININE 0.97  CALCIUM 9.6   PT/INR No results found for this basename: LABPROT:2,INR:2 in the last 72 hours ABG No results found for this basename: PHART:2,PCO2:2,PO2:2,HCO3:2 in the last 72 hours  Studies/Results: No results found.  Anti-infectives: Anti-infectives     Start     Dose/Rate Route Frequency Ordered Stop   10/06/11 2200   vancomycin (VANCOCIN) IVPB 1000 mg/200 mL premix  Status:  Discontinued     Comments: ceFAZolin (ANCEF) IVPB 1 g/50 mL premix (g) 1 g Given 10/06/11 1840       1,000 mg 200 mL/hr over 60 Minutes Intravenous Every 12 hours 10/06/11 2056 10/15/11 1058   10/06/11 1804    ceFAZolin (ANCEF) 1-5 GM-% IVPB     Comments: MCMILLEN, MIKE: cabinet override         10/06/11 1804 10/07/11 0614   10/05/11 1600   vancomycin (VANCOCIN) IVPB 1000 mg/200 mL premix        1,000 mg 200 mL/hr over 60 Minutes Intravenous  Once 10/05/11 1414 10/05/11 1725   10/05/11 0945   vancomycin (VANCOCIN) IVPB 1000 mg/200 mL premix        1,000 mg 200 mL/hr over 60 Minutes Intravenous  Once 10/05/11 0930 10/05/11 1017          Assessment/Plan: s/p Procedure(s): VENTRICULOSTOMY Plan  1. Continue encouraging po intake and ambulation. 2. Check [place] inhibition today. 3. If patient stable and OK with NES will consider DC to home tomorrow.. 4. Have discussed with patient..  LOS: 16 days    Cervando Durnin K 10/21/2011

## 2011-10-21 NOTE — Progress Notes (Signed)
Patient ID: Jill [name], female   DOB: [birth year], 60 y.o.   MRN: [id number] Subjective: Patient reports feeling better.  Walking with assistance.  Feels a bit unsteady (pitches forward, but feels this is improving).  Objective: Vital signs in last 24 hours: Temp:  [97 F (36.1 C)-98.4 F (36.9 C)] 97 F (36.1 C) (12/15 0000) Pulse Rate:  [53-74] 57  (12/15 0700) Resp:  [13-21] 18  (12/15 0700) BP: (115-149)/(46-86) 123/73 mmHg (12/15 0700) SpO2:  [95 %-98 %] 97 % (12/15 0700) Weight:  [65 kg (143 lb 4.8 oz)] 143 lb 4.8 oz (65 kg) (12/14 2000)  Intake/Output from previous day: 12/14 0701 - 12/15 0700 In: 840 [P.O.:840] Out: 1150 [Urine:1150] Intake/Output this shift:    Physical Exam: A & O x 3.  MAEW.  Ambulates well.  Lab Results: No results found for this basename: WBC:2,HGB:2,HCT:2,PLT:2 in the last 72 hours BMET  [facility] 10/19/11 1812  NA 136  K 4.2  CL 98  CO2 27  GLUCOSE 110*  BUN 17  CREATININE 0.97  CALCIUM 9.6    Studies/Results: No results found.  Assessment/Plan: Tx to floor when bed available.  Home in AM if continuing to do well.    LOS: 16 days    Rodel Glaspy D, MD 10/21/2011, 10:08 AM

## 2011-10-22 MED ORDER — AMLODIPINE BESYLATE 10 MG PO TABS: 10.0000 mg | ORAL_TABLET | Freq: Every day | ORAL | Status: DC

## 2011-10-22 MED ORDER — PANTOPRAZOLE SODIUM 40 MG PO TBEC: 40.0000 mg | DELAYED_RELEASE_TABLET | Freq: Two times a day (BID) | ORAL | Status: DC

## 2011-10-22 NOTE — Discharge Summary (Signed)
Physician Discharge Summary  Patient ID: Jill [name] MRN: [id number] DOB/AGE: July 10, 1951 60 y.o.  Admit date: 10/05/2011 Discharge date: 10/22/2011  Admission Diagnoses: Basilar artery apex aneurysm    Discharge Diagnoses: Basilar artery apex aneurysm status post stent-assisted coiling on 10/05/2011 with resolving hydrocephalus and intraventricular bleed Principal Problem:  *Obstructive hydrocephalus Active Problems:  Coronary artery disease  Hyperlipidemia  Hypothyroidism  Accelerated hypertension  tobacco abuse, gastroesophageal reflux disease, anxiety  Discharged Condition: good  Hospital Course: Jill [name] is  a 60 year old white female with a history of a single presyncopal event in November of 2012 and intermittent headaches who underwent CT angiography of the head on 09/09/2011 which revealed a 6 x 7 mm basilar artery tip aneurysm with a wide neck as well as a 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 The patient was referred to the neuro interventional radiology service by [facility] to assess for possible endovascular treatment of the basilar artery aneurysm. She was seen in consultation by Dr. Corliss Skains on 09/18/2011 and plans were made for cerebral angiography with stent-assisted coiling of the basilar artery apex aneurysm. On 10/05/2011 the patient underwent cerebral angiography by Dr. Corliss Skains. 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 Following discussion of the above results with the patient and family, decision was made to proceed with endovascular stent-assisted coiling of the basilar artery apex aneurysm under general anesthesia. The patient tolerated the procedure well and was transferred to the PACU in  stable condition. Following extubation, the patient remained lethargic and slow to arouse. A code stroke was called, the neurology service was consulted and a subsequent MRI of the brain revealed no evidence of acute stroke. The patient was then admitted to the neuro intensive care unit for further care and management. The patient's neurological status began to improve gradually, however she did experience intermittent frontal headaches and vomiting on 10/06/2011 and subsequently became more lethargic and unable to answer questions appropriately. Followup MRI of the brain on 10/06/2011 revealed interval development of hydrocephalus and interventricular hemorrhage. The intracerebral vasculature remained patent. The patient's IV heparin and antiplatelet medications were discontinued and a platelet transfusion was ordered. Dr. Franky Macho with neurosurgery was notified. The patient underwent placement of a right frontal ventriculostomy on 10/06/2011 by Dr. Franky Macho without complications, draining blood tinged CSF. The patient experienced improved neurological status postprocedure. On 10/07/2011 the patient's diet was advanced to clear liquids and the right femoral artery sheath was removed and Plavix restarted.[facility] were consulted to assist with management of the patient's blood pressure and hyperglycemia. The patient responded nicely to cardene infusion. Hyperglycemia was felt to be secondary to steroid use during admission. The patient was also seen by the [facility] cardiology service secondary to ST changes with a widening QRS complex on the monitor and susequent cardiac enzymes were negative and a 12-lead EKG revealed left bundle-branch block. The patient was deemed stable by cardiology and no further medication adjustments were made. CT scan of the head on 10/07/2011 revealed improving hydrocephalus with no acute infarction. Serial CT scans were performed during the patient's admission to closely  monitor hydrocephalus and interventricular/subarachnoid blood. On 10/19/2011 the patient's ventriculostomy was removed following toleration of a clamping trial. CT scan of the head on 12/13 revealed no acute infarct and decreased density  of interventricular blood. There was a slight increase in size of the ventricles noted. The patient remained neurologically stable. She continued to have intermittent mild right frontal parietal headaches which were relieved with Tylenol. On 12/16 the patient was discharged home in stable condition. There were no acute neurological problems. The patient did feel unsteady when ambulating(pitching forward slightly), however she felt that this was improving. She was tolerating her diet without difficulty .  Consults: Neurosurgery- Dr. Coletta Memos, neurology- Dr. Thana Farr, cardiology- Dr. Kristeen Miss, [facility]- Dr. Brendia Sacks  Significant Diagnostic Studies: Cerebral arteriogram on 16-Oct-2011 -  Revealing a 6.9 mm x 6.5 mm  basilar artery apex aneurysm, an approximately 2.5 mm x 2.5 mm right internal carotid artery superior hypophyseal region aneurysm, moderate left internal carotid artery atherosclerotic disease  and an approximately 30% stenosis of the distal basilar artery.     MRI of the brain 11/30 revealing minimal development of hydrocephalus and subarachnoid/interventricular hemorrhage.    CT of the head on 12/13 revealing decrease in density of interventricular blood with no  large acute thrombotic infarct and slight increase in size of ventricles. Results for orders placed during the hospital encounter of 10/16/11  POCT ACTIVATED CLOTTING TIME      Component Value Range   Activated Clotting Time 221    POCT ACTIVATED CLOTTING TIME      Component Value Range   Activated Clotting Time 276    POCT ACTIVATED CLOTTING TIME      Component Value Range   Activated Clotting Time 287    POCT ACTIVATED CLOTTING TIME      Component Value  Range   Activated Clotting Time 232    HEPARIN LEVEL (UNFRACTIONATED)      Component Value Range   Heparin Unfractionated 0.46  0.30 - 0.70 (IU/mL)  CBC      Component Value Range   WBC 19.2 (*) 4.0 - 10.5 (K/uL)   RBC 3.86 (*) 3.87 - 5.11 (MIL/uL)   Hemoglobin 12.5  12.0 - 15.0 (g/dL)   HCT 46.9  62.9 - 52.8 (%)   MCV 95.3  78.0 - 100.0 (fL)   MCH 32.4  26.0 - 34.0 (pg)   MCHC 34.0  30.0 - 36.0 (g/dL)   RDW 41.3  24.4 - 01.0 (%)   Platelets 223  150 - 400 (K/uL)  DIFFERENTIAL      Component Value Range   Neutrophils Relative 89 (*) 43 - 77 (%)   Neutro Abs 17.0 (*) 1.7 - 7.7 (K/uL)   Lymphocytes Relative 6 (*) 12 - 46 (%)   Lymphs Abs 1.2  0.7 - 4.0 (K/uL)   Monocytes Relative 5  3 - 12 (%)   Monocytes Absolute 1.0  0.1 - 1.0 (K/uL)   Eosinophils Relative 0  0 - 5 (%)   Eosinophils Absolute 0.0  0.0 - 0.7 (K/uL)   Basophils Relative 0  0 - 1 (%)   Basophils Absolute 0.0  0.0 - 0.1 (K/uL)  CBC      Component Value Range   WBC 14.8 (*) 4.0 - 10.5 (K/uL)   RBC 3.68 (*) 3.87 - 5.11 (MIL/uL)   Hemoglobin 11.8 (*) 12.0 - 15.0 (g/dL)   HCT 27.2 (*) 53.6 - 46.0 (%)   MCV 95.1  78.0 - 100.0 (fL)   MCH 32.1  26.0 - 34.0 (pg)   MCHC 33.7  30.0 - 36.0 (g/dL)   RDW 64.4  03.4 - 74.2 (%)   Platelets 211  150 - 400 (K/uL)  DIFFERENTIAL      Component Value Range   Neutrophils Relative 89 (*) 43 - 77 (%)   Neutro Abs 13.1 (*) 1.7 - 7.7 (K/uL)   Lymphocytes Relative 7 (*) 12 - 46 (%)   Lymphs Abs 1.0  0.7 - 4.0 (K/uL)   Monocytes Relative 4  3 - 12 (%)   Monocytes Absolute 0.6  0.1 - 1.0 (K/uL)   Eosinophils Relative 0  0 - 5 (%)   Eosinophils Absolute 0.0  0.0 - 0.7 (K/uL)   Basophils Relative 0  0 - 1 (%)   Basophils Absolute 0.0  0.0 - 0.1 (K/uL)  BASIC METABOLIC PANEL      Component Value Range   Sodium 140  135 - 145 (mEq/L)   Potassium 3.1 (*) 3.5 - 5.1 (mEq/L)   Chloride 105  96 - 112 (mEq/L)   CO2 25  19 - 32 (mEq/L)   Glucose, Bld 151 (*) 70 - 99 (mg/dL)   BUN 9  6  - 23 (mg/dL)   Creatinine, Ser 4.09  0.50 - 1.10 (mg/dL)   Calcium 8.0 (*) 8.4 - 10.5 (mg/dL)   GFR calc non Af Amer >90  >90 (mL/min)   GFR calc Af Amer >90  >90 (mL/min)  HEPARIN LEVEL (UNFRACTIONATED)      Component Value Range   Heparin Unfractionated <0.10 (*) 0.30 - 0.70 (IU/mL)  URINALYSIS, ROUTINE W REFLEX MICROSCOPIC      Component Value Range   Color, Urine YELLOW  YELLOW    APPearance CLEAR  CLEAR    Specific Gravity, Urine 1.043 (*) 1.005 - 1.030    pH 6.0  5.0 - 8.0    Glucose, UA NEGATIVE  NEGATIVE (mg/dL)   Hgb urine dipstick SMALL (*) NEGATIVE    Bilirubin Urine NEGATIVE  NEGATIVE    Ketones, ur NEGATIVE  NEGATIVE (mg/dL)   Protein, ur NEGATIVE  NEGATIVE (mg/dL)   Urobilinogen, UA 0.2  0.0 - 1.0 (mg/dL)   Nitrite NEGATIVE  NEGATIVE    Leukocytes, UA NEGATIVE  NEGATIVE   URINE MICROSCOPIC-ADD ON      Component Value Range   Squamous Epithelial / LPF RARE  RARE    WBC, UA 0-2  <3 (WBC/hpf)   RBC / HPF 3-6  <3 (RBC/hpf)   Bacteria, UA RARE  RARE    Casts GRANULAR CAST (*) NEGATIVE   MRSA PCR SCREENING      Component Value Range   MRSA by PCR NEGATIVE  NEGATIVE   PREPARE PLATELET PHERESIS      Component Value Range   Unit Number [id number]     Blood Component Type PLTPHER LR3     Unit division 00     Status of Unit ISSUED,FINAL     Transfusion Status OK TO TRANSFUSE     Unit Number [id number]     Blood Component Type PLTPHER LR2     Unit division 00     Status of Unit ISSUED,FINAL     Transfusion Status OK TO TRANSFUSE    GLUCOSE, CAPILLARY      Component Value Range   Glucose-Capillary 137 (*) 70 - 99 (mg/dL)  GLUCOSE, CAPILLARY      Component Value Range   Glucose-Capillary 140 (*) 70 - 99 (mg/dL)  GLUCOSE, CAPILLARY      Component Value Range   Glucose-Capillary 144 (*) 70 - 99 (mg/dL)   Comment 1 Notify RN     Comment 2 Documented  in Chart    GLUCOSE, CAPILLARY      Component Value Range   Glucose-Capillary 126 (*) 70 - 99 (mg/dL)  BASIC  METABOLIC PANEL      Component Value Range   Sodium 144  135 - 145 (mEq/L)   Potassium 3.1 (*) 3.5 - 5.1 (mEq/L)   Chloride 109  96 - 112 (mEq/L)   CO2 26  19 - 32 (mEq/L)   Glucose, Bld 142 (*) 70 - 99 (mg/dL)   BUN 16  6 - 23 (mg/dL)   Creatinine, Ser 6.57  0.50 - 1.10 (mg/dL)   Calcium 8.8  8.4 - 84.6 (mg/dL)   GFR calc non Af Amer 64 (*) >90 (mL/min)   GFR calc Af Amer 74 (*) >90 (mL/min)  GLUCOSE, CAPILLARY      Component Value Range   Glucose-Capillary 142 (*) 70 - 99 (mg/dL)  BASIC METABOLIC PANEL      Component Value Range   Sodium 138  135 - 145 (mEq/L)   Potassium 3.4 (*) 3.5 - 5.1 (mEq/L)   Chloride 106  96 - 112 (mEq/L)   CO2 24  19 - 32 (mEq/L)   Glucose, Bld 123 (*) 70 - 99 (mg/dL)   BUN 18  6 - 23 (mg/dL)   Creatinine, Ser 9.62  0.50 - 1.10 (mg/dL)   Calcium 8.6  8.4 - 95.2 (mg/dL)   GFR calc non Af Amer 77 (*) >90 (mL/min)   GFR calc Af Amer 90 (*) >90 (mL/min)  GLUCOSE, CAPILLARY      Component Value Range   Glucose-Capillary 115 (*) 70 - 99 (mg/dL)  PLATELET INHIBITION W4X32      Component Value Range   Platelet Function  P2Y12 234  194 - 418 (PRU)  DIFFERENTIAL      Component Value Range   Neutrophils Relative 83 (*) 43 - 77 (%)   Neutro Abs 10.5 (*) 1.7 - 7.7 (K/uL)   Lymphocytes Relative 10 (*) 12 - 46 (%)   Lymphs Abs 1.3  0.7 - 4.0 (K/uL)   Monocytes Relative 7  3 - 12 (%)   Monocytes Absolute 0.8  0.1 - 1.0 (K/uL)   Eosinophils Relative 0  0 - 5 (%)   Eosinophils Absolute 0.0  0.0 - 0.7 (K/uL)   Basophils Relative 0  0 - 1 (%)   Basophils Absolute 0.0  0.0 - 0.1 (K/uL)  BASIC METABOLIC PANEL      Component Value Range   Sodium 143  135 - 145 (mEq/L)   Potassium 3.0 (*) 3.5 - 5.1 (mEq/L)   Chloride 108  96 - 112 (mEq/L)   CO2 26  19 - 32 (mEq/L)   Glucose, Bld 121 (*) 70 - 99 (mg/dL)   BUN 12  6 - 23 (mg/dL)   Creatinine, Ser 4.40  0.50 - 1.10 (mg/dL)   Calcium 8.5  8.4 - 10.2 (mg/dL)   GFR calc non Af Amer 77 (*) >90 (mL/min)   GFR calc  Af Amer 90 (*) >90 (mL/min)  CBC      Component Value Range   WBC 11.4 (*) 4.0 - 10.5 (K/uL)   RBC 4.08  3.87 - 5.11 (MIL/uL)   Hemoglobin 13.1  12.0 - 15.0 (g/dL)   HCT 72.5  36.6 - 44.0 (%)   MCV 93.9  78.0 - 100.0 (fL)   MCH 32.1  26.0 - 34.0 (pg)   MCHC 34.2  30.0 - 36.0 (g/dL)   RDW 34.7  42.5 -  15.5 (%)   Platelets 219  150 - 400 (K/uL)  CBC      Component Value Range   WBC 13.8 (*) 4.0 - 10.5 (K/uL)   RBC 4.60  3.87 - 5.11 (MIL/uL)   Hemoglobin 14.7  12.0 - 15.0 (g/dL)   HCT 40.9  81.1 - 91.4 (%)   MCV 94.6  78.0 - 100.0 (fL)   MCH 32.0  26.0 - 34.0 (pg)   MCHC 33.8  30.0 - 36.0 (g/dL)   RDW 78.2  95.6 - 21.3 (%)   Platelets 262  150 - 400 (K/uL)  DIFFERENTIAL      Component Value Range   Neutrophils Relative 82 (*) 43 - 77 (%)   Neutro Abs 11.3 (*) 1.7 - 7.7 (K/uL)   Lymphocytes Relative 12  12 - 46 (%)   Lymphs Abs 1.7  0.7 - 4.0 (K/uL)   Monocytes Relative 5  3 - 12 (%)   Monocytes Absolute 0.7  0.1 - 1.0 (K/uL)   Eosinophils Relative 0  0 - 5 (%)   Eosinophils Absolute 0.0  0.0 - 0.7 (K/uL)   Basophils Relative 0  0 - 1 (%)   Basophils Absolute 0.0  0.0 - 0.1 (K/uL)  BASIC METABOLIC PANEL      Component Value Range   Sodium 139  135 - 145 (mEq/L)   Potassium 3.8  3.5 - 5.1 (mEq/L)   Chloride 107  96 - 112 (mEq/L)   CO2 23  19 - 32 (mEq/L)   Glucose, Bld 131 (*) 70 - 99 (mg/dL)   BUN 16  6 - 23 (mg/dL)   Creatinine, Ser 0.86  0.50 - 1.10 (mg/dL)   Calcium 8.6  8.4 - 57.8 (mg/dL)   GFR calc non Af Amer 75 (*) >90 (mL/min)   GFR calc Af Amer 87 (*) >90 (mL/min)  CBC      Component Value Range   WBC 12.2 (*) 4.0 - 10.5 (K/uL)   RBC 4.36  3.87 - 5.11 (MIL/uL)   Hemoglobin 13.9  12.0 - 15.0 (g/dL)   HCT 46.9  62.9 - 52.8 (%)   MCV 94.3  78.0 - 100.0 (fL)   MCH 31.9  26.0 - 34.0 (pg)   MCHC 33.8  30.0 - 36.0 (g/dL)   RDW 41.3  24.4 - 01.0 (%)   Platelets 243  150 - 400 (K/uL)  CARDIAC PANEL(CRET KIN+CKTOT+MB+TROPI)      Component Value Range   Total CK  51  7 - 177 (U/L)   CK, MB 2.8  0.3 - 4.0 (ng/mL)   Troponin I <0.30  <0.30 (ng/mL)   Relative Index RELATIVE INDEX IS INVALID  0.0 - 2.5   CBC      Component Value Range   WBC 15.6 (*) 4.0 - 10.5 (K/uL)   RBC 4.52  3.87 - 5.11 (MIL/uL)   Hemoglobin 14.4  12.0 - 15.0 (g/dL)   HCT 27.2  53.6 - 64.4 (%)   MCV 94.5  78.0 - 100.0 (fL)   MCH 31.9  26.0 - 34.0 (pg)   MCHC 33.7  30.0 - 36.0 (g/dL)   RDW 03.4  74.2 - 59.5 (%)   Platelets 265  150 - 400 (K/uL)  BASIC METABOLIC PANEL      Component Value Range   Sodium 135  135 - 145 (mEq/L)   Potassium 3.7  3.5 - 5.1 (mEq/L)   Chloride 103  96 - 112 (mEq/L)   CO2 23  19 -  32 (mEq/L)   Glucose, Bld 105 (*) 70 - 99 (mg/dL)   BUN 15  6 - 23 (mg/dL)   Creatinine, Ser 8.65  0.50 - 1.10 (mg/dL)   Calcium 8.7  8.4 - 78.4 (mg/dL)   GFR calc non Af Amer 89 (*) >90 (mL/min)   GFR calc Af Amer >90  >90 (mL/min)  HEMOGLOBIN A1C      Component Value Range   Hemoglobin A1C 6.0 (*) <5.7 (%)   Mean Plasma Glucose 126 (*) <117 (mg/dL)  BASIC METABOLIC PANEL      Component Value Range   Sodium 138  135 - 145 (mEq/L)   Potassium 4.2  3.5 - 5.1 (mEq/L)   Chloride 109  96 - 112 (mEq/L)   CO2 21  19 - 32 (mEq/L)   Glucose, Bld 120 (*) 70 - 99 (mg/dL)   BUN 14  6 - 23 (mg/dL)   Creatinine, Ser 6.96  0.50 - 1.10 (mg/dL)   Calcium 8.3 (*) 8.4 - 10.5 (mg/dL)   GFR calc non Af Amer 88 (*) >90 (mL/min)   GFR calc Af Amer >90  >90 (mL/min)  CARDIAC PANEL(CRET KIN+CKTOT+MB+TROPI)      Component Value Range   Total CK 48  7 - 177 (U/L)   CK, MB 3.8  0.3 - 4.0 (ng/mL)   Troponin I <0.30  <0.30 (ng/mL)   Relative Index RELATIVE INDEX IS INVALID  0.0 - 2.5   CBC      Component Value Range   WBC 14.4 (*) 4.0 - 10.5 (K/uL)   RBC 4.11  3.87 - 5.11 (MIL/uL)   Hemoglobin 13.2  12.0 - 15.0 (g/dL)   HCT 29.5  28.4 - 13.2 (%)   MCV 94.6  78.0 - 100.0 (fL)   MCH 32.1  26.0 - 34.0 (pg)   MCHC 33.9  30.0 - 36.0 (g/dL)   RDW 44.0  10.2 - 72.5 (%)   Platelets 250   150 - 400 (K/uL)  VANCOMYCIN, TROUGH      Component Value Range   Vancomycin Tr 18.7  10.0 - 20.0 (ug/mL)  BASIC METABOLIC PANEL      Component Value Range   Sodium 136  135 - 145 (mEq/L)   Potassium 4.2  3.5 - 5.1 (mEq/L)   Chloride 104  96 - 112 (mEq/L)   CO2 24  19 - 32 (mEq/L)   Glucose, Bld 112 (*) 70 - 99 (mg/dL)   BUN 13  6 - 23 (mg/dL)   Creatinine, Ser 3.66  0.50 - 1.10 (mg/dL)   Calcium 8.6  8.4 - 44.0 (mg/dL)   GFR calc non Af Amer 87 (*) >90 (mL/min)   GFR calc Af Amer >90  >90 (mL/min)  DIFFERENTIAL      Component Value Range   Neutrophils Relative 62  43 - 77 (%)   Neutro Abs 6.4  1.7 - 7.7 (K/uL)   Lymphocytes Relative 25  12 - 46 (%)   Lymphs Abs 2.5  0.7 - 4.0 (K/uL)   Monocytes Relative 8  3 - 12 (%)   Monocytes Absolute 0.8  0.1 - 1.0 (K/uL)   Eosinophils Relative 5  0 - 5 (%)   Eosinophils Absolute 0.5  0.0 - 0.7 (K/uL)   Basophils Relative 0  0 - 1 (%)   Basophils Absolute 0.0  0.0 - 0.1 (K/uL)  GLUCOSE, CAPILLARY      Component Value Range   Glucose-Capillary 138 (*) 70 - 99 (mg/dL)  PLATELET INHIBITION P2Y12      Component Value Range   Platelet Function  P2Y12 209  194 - 418 (PRU)  COMPREHENSIVE METABOLIC PANEL      Component Value Range   Sodium 136  135 - 145 (mEq/L)   Potassium 4.2  3.5 - 5.1 (mEq/L)   Chloride 98  96 - 112 (mEq/L)   CO2 27  19 - 32 (mEq/L)   Glucose, Bld 110 (*) 70 - 99 (mg/dL)   BUN 17  6 - 23 (mg/dL)   Creatinine, Ser 1.61  0.50 - 1.10 (mg/dL)   Calcium 9.6  8.4 - 09.6 (mg/dL)   Total Protein 6.9  6.0 - 8.3 (g/dL)   Albumin 3.7  3.5 - 5.2 (g/dL)   AST 20  0 - 37 (U/L)   ALT 36 (*) 0 - 35 (U/L)   Alkaline Phosphatase 57  39 - 117 (U/L)   Total Bilirubin 0.2 (*) 0.3 - 1.2 (mg/dL)   GFR calc non Af Amer 61 (*) >90 (mL/min)   GFR calc Af Amer 71 (*) >90 (mL/min)  MAGNESIUM      Component Value Range   Magnesium 2.5  1.5 - 2.5 (mg/dL)  PHOSPHORUS      Component Value Range   Phosphorus 4.9 (*) 2.3 - 4.6 (mg/dL)    PLATELET INHIBITION P2Y12      Component Value Range   Platelet Function  P2Y12 172 (*) 194 - 418 (PRU)    Treatments/ procedures : 10/05/2011-stent-assisted coiling of basilar artery apex aneurysm under general anesthesia ; 10/06/2011- right frontal ventriculostomy by Dr.  Franky Macho   Discharge Exam: Blood pressure 133/68, pulse 67, temperature 97.8 F (36.6 C), temperature source Oral, resp. rate 17, height 5\' 7"  (1.702 m), weight 143 lb 4.8 oz (65 kg), SpO2 97.00%. The patient is awake and alert. Speech and comprehension is clear. Pupils are equal round and reactive to light and accommodation. Extraocular movements are full and intact. Visual fields are full to confrontation. There is no facial asymmetry. Tongue is midline. Motor exam reveals no drift of outstretched arms. Power is 5/5 proximally and distally in all 4 extremities. Fine motor and coordination to finger to nose is equal bilaterally. Gait is mildly wide based. Romberg is negative.  Chest is clear to auscultation bilaterally. Heart regular rate and rhythm. Abdomen soft, nontender, positive bowel sounds. Extremities without significant edema, intact distal pulses. No groin hematoma present. Disposition: Home or Self Care  Discharge Orders    Future Orders Please Complete By Expires   Diet - low sodium heart healthy      Increase activity slowly      May shower / Bathe      Driving Restrictions      Comments:   No driving for 2 weeks   Lifting restrictions      Comments:   No heavy lifting for 2 weeks   Other Restrictions      Comments:   No smoking or secondhand smoke exposure   Remove dressing in 24 hours      Scheduling Instructions:   May change bandage in 24 hours; wound care per neurosurgery     Current Discharge Medication List    START taking these medications   Details  amLODipine (NORVASC) 10 MG tablet Take 1 tablet (10 mg total) by mouth daily. Qty: 30 tablet, Refills: 0    pantoprazole (PROTONIX) 40  MG tablet Take 1 tablet (40 mg total) by mouth 2 (two) times daily  before a meal. Qty: 30 tablet, Refills: 0      CONTINUE these medications which have NOT CHANGED   Details  ALPRAZolam (XANAX) 0.5 MG tablet Take 0.5 mg by mouth at bedtime as needed. For anxiety     aspirin 81 MG tablet Take 81 mg by mouth daily.      clopidogrel (PLAVIX) 75 MG tablet Take 75 mg by mouth daily.     hydrochlorothiazide 25 MG tablet Take 1 tablet (25 mg total) by mouth daily. Qty: 90 tablet, Refills: 3   Associated Diagnoses: CAD (coronary artery disease)    levothyroxine (SYNTHROID, LEVOTHROID) 100 MCG tablet Take 1 tablet (100 mcg total) by mouth daily. Qty: 30 tablet, Refills: 3    losartan (COZAAR) 100 MG tablet Take 1 tablet (100 mg total) by mouth daily. Qty: 90 tablet, Refills: 3   Associated Diagnoses: CAD (coronary artery disease)    metoprolol (LOPRESSOR) 50 MG tablet Take 50 mg by mouth daily.      nitroGLYCERIN (NITROSTAT) 0.4 MG SL tablet Place 1 tablet (0.4 mg total) under the tongue every 5 (five) minutes as needed for chest pain. Qty: 25 tablet, Refills: 12   Associated Diagnoses: CAD (coronary artery disease)    PARoxetine (PAXIL) 40 MG tablet Take 1 tablet (40 mg total) by mouth every morning. Qty: 30 tablet, Refills: 3    simvastatin (ZOCOR) 80 MG tablet Take 0.5 tablets (40 mg total) by mouth at bedtime. Qty: 30 tablet, Refills: 12   Associated Diagnoses: CAD (coronary artery disease)      STOP taking these medications     omeprazole (PRILOSEC) 40 MG capsule        Follow-up Information    Follow up with DEVESHWAR,SANJEEV K. Make an appointment in 2 months. (xray will call you to set up follow up appointment with Dr. Corliss Skains in 2 weeks;  call [phone] or [phone] with any questions)    Contact information:   [address], [address] [place] [address] [phone]          Signed: Chinita Pester 10/22/2011, 9:45 AM

## 2011-10-22 NOTE — Progress Notes (Signed)
Nurses note: discharge note discharge instructions discussed and explained with patient and husband.  Went over all discharge instructions and all questions answered.  Patient alert and oriented, no c/o pain.  PIV discontinued and catheter in tact.  Pt brought to car via  Wheelchair.  VS stable

## 2011-10-22 NOTE — Progress Notes (Signed)
Subjective: Doing better.  Going home.  Objective: Vital signs in last 24 hours: Temp:  [97.8 F (36.6 C)-98.6 F (37 C)] 97.8 F (36.6 C) (12/16 0400) Pulse Rate:  [52-88] 66  (12/16 0400) Resp:  [16-23] 18  (12/16 0400) BP: (94-151)/(55-86) 123/78 mmHg (12/16 0400) SpO2:  [95 %-100 %] 96 % (12/16 0400) Weight change:  Last BM Date: 10/21/11  Intake/Output from previous day: 12/15 0701 - 12/16 0700 In: 1720 [P.O.:1720] Out: 1976 [Urine:1975; Stool:1] Last cbgs: CBG (last 3)  No results found for this basename: GLUCAP:3 in the last 72 hours   Physical Exam   Lab Results: No results found for this basename: WBC:2,HGB:2,HCT:2,PLT:2 in the last 72 hours BMET  [facility] 10/19/11 1812  NA 136  K 4.2  CL 98  CO2 27  GLUCOSE 110*  BUN 17  CREATININE 0.97  CALCIUM 9.6    Studies/Results: No results found.  Medications: I have reviewed the patient's current medications.  Assessment/Plan: Much improved.  D/C home.  F/U with Dr. Franky Macho as outpatient.  Dorian Heckle , MD 10/22/2011, 6:45 AM

## 2011-10-22 NOTE — Progress Notes (Signed)
16 Days Post-Op  Subjective: C/O 1/10 rt frontal headache..Otherwise has no specific neuro or systemic complaints. Ambulatory ,though still slightly unsteady. Platelet inhibition of approximately 40 percent.  Objective: Vital signs in last 24 hours: Temp:  [97.8 F (36.6 C)-98.6 F (37 C)] 97.8 F (36.6 C) (12/16 0400) Pulse Rate:  [52-88] 67  (12/16 0800) Resp:  [16-21] 17  (12/16 0800) BP: (94-140)/(55-78) 133/68 mmHg (12/16 0800) SpO2:  [95 %-100 %] 97 % (12/16 0800) Last BM Date: 10/21/11  Intake/Output from previous day: 12/15 0701 - 12/16 0700 In: 1780 [P.O.:1780] Out: 1976 [Urine:1975; Stool:1] Intake/Output this shift: Total I/O In: 320 [P.O.:320] Out: -   On exam AVSS.Marland Kitchen Neuro. Alert, awake oriented to time ,place and space.Marland Kitchen  Speech and comprehension clear.Marland Kitchen  PEARLA..3mm.  EOMS full.Jill Alexanders Fields full to confrontation.  No Facial asymmetry.  Tongue Midline..  Motor..           NO drift of outstretched arms..          Power.5/5 Proximally and distally all four extremities..  Fine motor and coordination to finger to nose equal..  Gait  MIldy wide based  Romberg  negative.  Heel to toe. Veering to either side. Says has  Never been able to.  Lab Results:  No results found for this basename: WBC:2,HGB:2,HCT:2,PLT:2 in the last 72 hours BMET  [facility] 10/19/11 1812  NA 136  K 4.2  CL 98  CO2 27  GLUCOSE 110*  BUN 17  CREATININE 0.97  CALCIUM 9.6   PT/INR No results found for this basename: LABPROT:2,INR:2 in the last 72 hours ABG No results found for this basename: PHART:2,PCO2:2,PO2:2,HCO3:2 in the last 72 hours  Studies/Results: No results found.  Anti-infectives: Anti-infectives     Start     Dose/Rate Route Frequency Ordered Stop   10/06/11 2200   vancomycin (VANCOCIN) IVPB 1000 mg/200 mL premix  Status:  Discontinued     Comments: ceFAZolin (ANCEF) IVPB 1 g/50 mL premix (g) 1 g Given 10/06/11 1840       1,000 mg 200  mL/hr over 60 Minutes Intravenous Every 12 hours 10/06/11 2056 10/15/11 1058   10/06/11 1804   ceFAZolin (ANCEF) 1-5 GM-% IVPB     Comments: MCMILLEN, MIKE: cabinet override         10/06/11 1804 10/07/11 0614   10/05/11 1600   vancomycin (VANCOCIN) IVPB 1000 mg/200 mL premix        1,000 mg 200 mL/hr over 60 Minutes Intravenous  Once 10/05/11 1414 10/05/11 1725   10/05/11 0945   vancomycin (VANCOCIN) IVPB 1000 mg/200 mL premix        1,000 mg 200 mL/hr over 60 Minutes Intravenous  Once 10/05/11 0930 10/05/11 1017          Assessment/Plan: s/p Procedure(s): VENTRICULOSTOMY PLan. .1. D/C to home with home instructions. 2. Will leave on aspirin 81 mg per day,and plavix 75 mg per day for 2 weeks,in addition to her reg meds. 3. NO SMOKING ,OR AROUND SMOKERS.., 4. RTC in 2 weeks. 5.To see DrCabbell also .Will contact his office..  I have discussed this with the patient and her husband..  LOS: 17 days    Jill [name] K 10/22/2011

## 2011-10-24 ENCOUNTER — Other Ambulatory Visit: Payer: Self-pay | Admitting: *Deleted

## 2011-10-24 ENCOUNTER — Telehealth (HOSPITAL_COMMUNITY): Payer: Self-pay

## 2011-11-06 ENCOUNTER — Other Ambulatory Visit (HOSPITAL_COMMUNITY): Payer: Self-pay | Admitting: Interventional Radiology

## 2011-11-06 DIAGNOSIS — I729 Aneurysm of unspecified site: Secondary | ICD-10-CM

## 2011-11-13 ENCOUNTER — Ambulatory Visit (HOSPITAL_COMMUNITY): Payer: Self-pay

## 2011-11-15 ENCOUNTER — Ambulatory Visit (HOSPITAL_COMMUNITY)
Admission: RE | Admit: 2011-11-15 | Discharge: 2011-11-15 | Disposition: A | Discharge status: Home / Self Care | Payer: Self-pay | Source: Ambulatory Visit | Attending: Interventional Radiology | Admitting: Interventional Radiology

## 2011-11-15 ENCOUNTER — Other Ambulatory Visit (HOSPITAL_COMMUNITY): Payer: Self-pay | Admitting: Interventional Radiology

## 2011-11-15 DIAGNOSIS — R51 Headache: Secondary | ICD-10-CM | POA: Insufficient documentation

## 2011-11-15 DIAGNOSIS — I729 Aneurysm of unspecified site: Secondary | ICD-10-CM

## 2011-11-15 DIAGNOSIS — R519 Headache, unspecified: Secondary | ICD-10-CM

## 2011-11-15 DIAGNOSIS — Z9889 Other specified postprocedural states: Secondary | ICD-10-CM | POA: Insufficient documentation

## 2012-01-12 ENCOUNTER — Other Ambulatory Visit (HOSPITAL_COMMUNITY): Payer: Self-pay | Admitting: Interventional Radiology

## 2012-01-23 NOTE — Telephone Encounter (Signed)
Contacts         Type  Contact  Phone    10/24/2011 10:14 AM  Phone (Incoming)  Clovis Cao (Child)  [phone]    Pt's daughter called to ask about Klor-conm10 (potassium) Can pt resume taking //Dr. Corliss Skains stated pt could continue and advised that the pt start walking around the house. 3 times a day

## 2012-01-25 ENCOUNTER — Telehealth (HOSPITAL_COMMUNITY): Payer: Self-pay

## 2012-01-25 ENCOUNTER — Other Ambulatory Visit (HOSPITAL_COMMUNITY): Payer: Self-pay | Admitting: Interventional Radiology

## 2012-01-25 NOTE — Telephone Encounter (Signed)
Spoke with Mrs. Hirschmann in regards to her F/u angio  Dr. Corliss Skains has stated that we need to schedule that for the end of April.. I told her he stated that he would see her in consult if she would feel better with that.  She stated she would wait but the , pressure when she coughs was terrible

## 2012-02-09 ENCOUNTER — Other Ambulatory Visit: Payer: Self-pay | Admitting: Family Medicine

## 2012-02-09 MED ORDER — LEVOTHYROXINE SODIUM 100 MCG PO TABS: 100.0000 ug | ORAL_TABLET | Freq: Every day | ORAL | Status: DC

## 2012-02-16 ENCOUNTER — Other Ambulatory Visit: Payer: Self-pay | Admitting: Family Medicine

## 2012-02-16 MED ORDER — LEVOTHYROXINE SODIUM 100 MCG PO TABS: 100.0000 ug | ORAL_TABLET | Freq: Every day | ORAL | Status: DC

## 2012-03-04 ENCOUNTER — Other Ambulatory Visit (HOSPITAL_COMMUNITY): Payer: Self-pay | Admitting: Interventional Radiology

## 2012-03-04 DIAGNOSIS — I729 Aneurysm of unspecified site: Secondary | ICD-10-CM

## 2012-03-04 DIAGNOSIS — Z09 Encounter for follow-up examination after completed treatment for conditions other than malignant neoplasm: Secondary | ICD-10-CM

## 2012-03-05 ENCOUNTER — Other Ambulatory Visit: Payer: Self-pay | Admitting: Cardiovascular Disease

## 2012-03-05 DIAGNOSIS — I2510 Atherosclerotic heart disease of native coronary artery without angina pectoris: Secondary | ICD-10-CM

## 2012-03-05 MED ORDER — HYDROCHLOROTHIAZIDE 25 MG PO TABS: 25.0000 mg | ORAL_TABLET | Freq: Every day | ORAL | Status: DC

## 2012-03-05 MED ORDER — METOPROLOL TARTRATE 50 MG PO TABS: 50.0000 mg | ORAL_TABLET | Freq: Every day | ORAL | Status: DC

## 2012-03-05 MED ORDER — AMLODIPINE BESYLATE 10 MG PO TABS: 10.0000 mg | ORAL_TABLET | Freq: Every day | ORAL | Status: DC

## 2012-03-05 NOTE — Telephone Encounter (Signed)
Pt called and informed she needs paxil and thyroid meds refilled by pcp.

## 2012-03-08 ENCOUNTER — Telehealth: Payer: Self-pay | Admitting: Cardiovascular Disease

## 2012-03-11 ENCOUNTER — Encounter: Payer: Self-pay | Admitting: Family Medicine

## 2012-03-11 ENCOUNTER — Ambulatory Visit: Payer: Self-pay | Admitting: Family Medicine

## 2012-03-11 ENCOUNTER — Ambulatory Visit (INDEPENDENT_AMBULATORY_CARE_PROVIDER_SITE_OTHER): Payer: Self-pay | Admitting: Family Medicine

## 2012-03-11 VITALS — BP 142/91 | HR 66 | Ht 67.0 in | Wt 152.0 lb

## 2012-03-11 DIAGNOSIS — Q282 Arteriovenous malformation of cerebral vessels: Secondary | ICD-10-CM

## 2012-03-11 DIAGNOSIS — Z792 Long term (current) use of antibiotics: Secondary | ICD-10-CM

## 2012-03-11 DIAGNOSIS — E039 Hypothyroidism, unspecified: Secondary | ICD-10-CM

## 2012-03-11 DIAGNOSIS — Q283 Other malformations of cerebral vessels: Secondary | ICD-10-CM

## 2012-03-11 DIAGNOSIS — E785 Hyperlipidemia, unspecified: Secondary | ICD-10-CM

## 2012-03-11 DIAGNOSIS — F411 Generalized anxiety disorder: Secondary | ICD-10-CM

## 2012-03-11 DIAGNOSIS — I2510 Atherosclerotic heart disease of native coronary artery without angina pectoris: Secondary | ICD-10-CM

## 2012-03-11 DIAGNOSIS — F419 Anxiety disorder, unspecified: Secondary | ICD-10-CM

## 2012-03-11 LAB — COMPREHENSIVE METABOLIC PANEL
Albumin: 4.4 g/dL (ref 3.5–5.2)
Alkaline Phosphatase: 57 U/L (ref 39–117)
CO2: 31 mEq/L (ref 19–32)
Chloride: 101 mEq/L (ref 96–112)
Glucose, Bld: 100 mg/dL — ABNORMAL HIGH (ref 70–99)
Potassium: 4.3 mEq/L (ref 3.5–5.3)
Sodium: 141 mEq/L (ref 135–145)
Total Protein: 7.2 g/dL (ref 6.0–8.3)

## 2012-03-11 LAB — LIPID PANEL
Cholesterol: 247 mg/dL — ABNORMAL HIGH (ref 0–200)
Total CHOL/HDL Ratio: 6.3 Ratio
Triglycerides: 226 mg/dL — ABNORMAL HIGH (ref <150)
VLDL: 45 mg/dL — ABNORMAL HIGH (ref 0–40)

## 2012-03-11 LAB — TSH: TSH: 146.741 u[IU]/mL — ABNORMAL HIGH (ref 0.350–4.500)

## 2012-03-11 MED ORDER — TRAZODONE HCL 50 MG PO TABS: 25.0000 mg | ORAL_TABLET | Freq: Every evening | ORAL | Status: DC | PRN

## 2012-03-11 NOTE — Progress Notes (Signed)
  Subjective:    Patient ID: Jill [name], female    DOB: 08-20-1951, 61 y.o.   MRN: [id number]  HPI  Here for follow-up  Brain AVM: Since she last saw me for a new patient appointment, she has undergone coiling and stenting for a brain AVM which caused an IVH.  Hospital stay was from 11-29- 12-16.  No residual effects.  Some headache.  Being followed by Neurosurg.  Anxiety:  Continues to feel anxious.  Taking paxil.  Takes xanax prn.  Not currenlty prescribed.  NO SI, HI.  Feels she needs it most for sleep.  CAD:  No chest pain. Has not used ntg.    Hypothyroidism: continues to take levothyroxine  HYPERLIPIDEMIA  Diet: Not following low cholesterol diet Exercise: No regular exercise Wt Readings from Last 3 Encounters:  03/11/12 152 lb (68.947 kg)  10/20/11 143 lb 4.8 oz (65 kg)  10/20/11 143 lb 4.8 oz (65 kg)   ROS:  Denies RUQ pain, myalgias, or symptoms or coronary ischemia Lab Results  Component Value Date   LDLCALC 163* 03/11/2012   Lab Results  Component Value Date   CHOL 247* 03/11/2012   CHOL 121 05/13/2011   CHOL 120 03/23/2011   Lab Results  Component Value Date   HDL 39* 03/11/2012   HDL 43 4/0/9811   HDL 36.70* 03/23/2011   Lab Results  Component Value Date   TRIG 226* 03/11/2012   TRIG 45 05/13/2011   TRIG 126.0 03/23/2011   Lab Results  Component Value Date   ALT 18 03/11/2012   AST 28 03/11/2012   ALKPHOS 57 03/11/2012   BILITOT 0.4 03/11/2012       Review of Systems    neg as per hpi Objective:   Physical Exam GEN: Alert & Oriented, No acute distress CV:  Regular Rate & Rhythm, no murmur Respiratory:  Normal work of breathing, CTAB Abd:  + BS, soft, no tenderness to palpation Ext: no pre-tibial edema        Assessment & Plan:

## 2012-03-11 NOTE — Patient Instructions (Addendum)
Trazodone for sleep- start with a half tab and then can move up to a whole tab if needed  Will check your cholesterol, thyroid, and electrolytes today  Come back for gynecological exam and we will discuss scheduling your mammogram again  Try slowly increasing your daily exercise each day.  This will help your heart, your mood, and your sleep!!

## 2012-03-12 ENCOUNTER — Encounter: Payer: Self-pay | Admitting: Family Medicine

## 2012-03-12 DIAGNOSIS — Q282 Arteriovenous malformation of cerebral vessels: Secondary | ICD-10-CM | POA: Insufficient documentation

## 2012-03-12 MED ORDER — LEVOTHYROXINE SODIUM 112 MCG PO TABS: 112.0000 ug | ORAL_TABLET | Freq: Every day | ORAL | Status: DC

## 2012-03-12 NOTE — Assessment & Plan Note (Signed)
TSH nmarkedly elevated- patient states taking levothyroxine with no new foods or meds.  Will increase slowly from 100 mcg to 112 mcg.  Gave precautions for angina.  Will recheck in 6 weeks.

## 2012-03-12 NOTE — Assessment & Plan Note (Signed)
CABG 2008.  Cardiologist- Dr. Elease Hashimoto

## 2012-03-12 NOTE — Assessment & Plan Note (Signed)
LDL markedly elevated since last checked a year ago. (69 to 163)  Possibly due to changind simvastatin from 80 to 40.  Discussed improving nutrition and exercise,  offered nutriton consult if desired.  She is speaking with [facility] Assistance program- is hopeful she will soon qualify for Medicaid.  Advised her to contact myself or her cardiologist once she qualifies and we would escalate her statin therapy.

## 2012-03-12 NOTE — Assessment & Plan Note (Signed)
Continue Paxil, advised to wean herself of xanax.  Discussed adverse side effects of benzodiazepines.  Will start trazodone for sleep at 25-50 mg nightly.

## 2012-03-12 NOTE — Assessment & Plan Note (Addendum)
Followed by Dr. Corliss Skains.  CTA scheduled for this month.

## 2012-03-15 ENCOUNTER — Other Ambulatory Visit: Payer: Self-pay | Admitting: Radiology

## 2012-03-19 NOTE — Telephone Encounter (Signed)
Jill [name] called to reschedule her angio

## 2012-03-21 ENCOUNTER — Ambulatory Visit (HOSPITAL_COMMUNITY): Admission: RE | Admit: 2012-03-21 | Payer: Self-pay | Source: Ambulatory Visit

## 2012-03-25 ENCOUNTER — Other Ambulatory Visit: Payer: Self-pay | Admitting: Cardiovascular Disease

## 2012-03-25 NOTE — Telephone Encounter (Signed)
Pt is due for an appointment at the end of the month. At that time pt will get more refills. Fax Received. Refill Completed. Jill [name] (R.M.A)

## 2012-04-05 ENCOUNTER — Encounter: Payer: Self-pay | Admitting: Cardiovascular Disease

## 2012-04-05 ENCOUNTER — Ambulatory Visit (INDEPENDENT_AMBULATORY_CARE_PROVIDER_SITE_OTHER): Payer: Self-pay | Admitting: Cardiovascular Disease

## 2012-04-05 ENCOUNTER — Other Ambulatory Visit: Payer: Self-pay | Admitting: Cardiovascular Disease

## 2012-04-05 VITALS — BP 116/76 | HR 80 | Ht 67.0 in | Wt 154.8 lb

## 2012-04-05 DIAGNOSIS — I2510 Atherosclerotic heart disease of native coronary artery without angina pectoris: Secondary | ICD-10-CM

## 2012-04-05 DIAGNOSIS — E785 Hyperlipidemia, unspecified: Secondary | ICD-10-CM

## 2012-04-05 MED ORDER — ATORVASTATIN CALCIUM 40 MG PO TABS: 40.0000 mg | ORAL_TABLET | Freq: Every day | ORAL | Status: DC

## 2012-04-05 NOTE — Patient Instructions (Signed)
Your physician wants you to follow-up in: 6 MONTHS  You will receive a reminder letter in the mail two months in advance. If you don't receive a letter, please call our office to schedule the follow-up appointment.   Your physician recommends that you return for a FASTING lipid profile: 3 MONTHS   Your physician has recommended you make the following change in your medication:   STOP SIMVASTATIN START ATORVASTATIN 40 MG DAILY

## 2012-04-05 NOTE — Assessment & Plan Note (Signed)
Jill [name] is doing well from a cardiac standpoint. She has not had any episodes of chest pain or shortness of breath. Her last lipid levels  were elevated.  We will discontinue her simvastatin and started on atorvastatin 40 mg a day.  Unfortunately she continues to smoke.  i have asked her stop smoking which would greatly reduce her risk of developing further coronary disease.

## 2012-04-05 NOTE — Telephone Encounter (Signed)
Fax Received. Refill Completed. Toshi Ishii Chowoe (R.M.A)   

## 2012-04-05 NOTE — Progress Notes (Signed)
Jill [name] Date of Birth  09-17-1951       [facility]    [place] [address]  [address] [place], [place]  [address]   [place], [place]  [address] [phone]     [phone]   Fax  [phone]    Fax [phone]  Problem List: 1. Coronary disease-status post CABG 2. Hypothyroidism 3. Hyperlipidemia 4. Hypertension 5. brainstem AV malformation with coiling. She had an intracerebral bleed. She was hospitalized for about a month from November to December 2012.  10/05/2011-stent-assisted coiling of basilar artery apex aneurysm under general anesthesia ; 10/06/2011- right frontal ventriculostomy by Dr. Franky Macho   History of Present Illness:  Jill [name] is a 61 year old female with the above-noted medical history. She has had a prolonged hospitalization since that she last saw me last year. She was hospitalized after having an episode of syncope. She was found to have an AV malformation. She underwent a stent-assisted coiling but didn't apparently had some bleeding. She then had to have a right frontal ventriculostomy.  She states that her memory is not as good as it used to be but otherwise she has made a fairly good recovery.  She is scheduled to have a cerebral angiogram performed in June.  A cardiac standpoint she seems to be doing well. She's not having any episodes of chest pain or shortness breath. She still smokes one pack cigarettes a day.    Current Outpatient Prescriptions on File Prior to Visit  Medication Sig Dispense Refill  . amLODipine (NORVASC) 10 MG tablet Take 1 tablet (10 mg total) by mouth daily.  30 tablet  4  . aspirin 81 MG tablet Take 81 mg by mouth daily.        . hydrochlorothiazide (HYDRODIURIL) 25 MG tablet Take 1 tablet (25 mg total) by mouth daily.  30 tablet  4  . levothyroxine (SYNTHROID, LEVOTHROID) 112 MCG tablet Take 1 tablet (112 mcg total) by mouth daily.  30 tablet  2  . losartan (COZAAR) 100 MG  tablet Take 1 tablet (100 mg total) by mouth daily.  90 tablet  3  . metoprolol (LOPRESSOR) 50 MG tablet Take 1 tablet (50 mg total) by mouth daily.  30 tablet  4  . DISCONTD: PARoxetine (PAXIL) 40 MG tablet Take 1 tablet (40 mg total) by mouth every morning.  30 tablet  3  . DISCONTD: simvastatin (ZOCOR) 80 MG tablet TAKE ONE-HALF TABLET BY MOUTH EVERY DAY AT BEDTIME  30 tablet  0  . nitroGLYCERIN (NITROSTAT) 0.4 MG SL tablet Place 1 tablet (0.4 mg total) under the tongue every 5 (five) minutes as needed for chest pain.  25 tablet  12    Allergies  Allergen Reactions  . Ace Inhibitors Cough  . Penicillins   . Sulfa Drugs Cross Reactors Rash    Past Medical History  Diagnosis Date  . Coronary artery disease     Prior inferior MI with stent to RCA, s/p CABG in 2008  . Hyperlipidemia   . Hypertension   . Fatigue   . Dizziness   . Hypothyroidism   . Normal nuclear stress test Ju;y 2012    No ischemia. EF 70%; fixed defect involving septum, inferoseptal and inferior wall  . Tobacco abuse   . Retroperitoneal bleeding     Following cardiac cath  . GERD (gastroesophageal reflux disease)   . Headache     UNRUPTURED CEREBRAL ANEURYSM  . Myocardial infarction   .  Blood transfusion   . Anxiety     ON PAXIL, XANAX  . AVM (arteriovenous malformation) brain     s/p stent/coil    Past Surgical History  Procedure Date  . Cardiac catheterization 01/02/2007    IT REVEALS MILD INFERIOR WALL HYPOKINESIS. THE EJECTION FRACTION IS AROUND 50%  . Coronary artery bypass graft 2008    LIMA to LAD, SVG to DX, SVG to LCX & SVG to OM 1 & 2, and SVG to PD  . Coronary stent placement     Remote past stent to RCA  . Hemorrhoid surgery 1989  . Ventriculostomy 10/06/2011    Procedure: VENTRICULOSTOMY;  Surgeon: Carmela Hurt;  Location: [facility];  Service: Neurosurgery;  Laterality: Right;  Insertion of Ventriculostomy Catheter    History  Smoking status  . Current Everyday Smoker -- 0.2  packs/day for 53 years  . Types: Cigarettes  Smokeless tobacco  . Not on file    History  Alcohol Use No    Family History  Problem Relation Age of Onset  . Heart disease Neg Hx   . Cancer Mother 72  . Diabetes Mother   . Alcohol abuse Father   . Asthma Father   . Diabetes Brother   . Cancer Brother     lung cancer  . Cancer Maternal Aunt     ovarian ca  . Cancer Paternal Aunt     liver cancer    Reviw of Systems:  Reviewed in the HPI.  All other systems are negative.  Physical Exam: Blood pressure 116/76, pulse 80, height 5\' 7"  (1.702 m), weight 154 lb 12.8 oz (70.217 kg). General: Well developed, well nourished, in no acute distress.  Head: Normocephalic, atraumatic, sclera non-icteric, mucus membranes are moist,   Neck: Supple. Carotids are 2 + without bruits. No JVD  Lungs: Clear bilaterally to auscultation.  Heart: regular rate.  normal  S1 S2. No murmurs, gallops or rubs.  Abdomen: Soft, non-tender, non-distended with normal bowel sounds. No hepatomegaly. No rebound/guarding. No masses.  Msk:  Strength and tone are normal  Extremities: No clubbing or cyanosis. No edema.  Distal pedal pulses are 2+ and equal bilaterally.  Neuro: Alert and oriented X 3. Moves all extremities spontaneously.  Psych:  Responds to questions appropriately with a normal affect.  ECG:  Assessment / Plan:

## 2012-04-23 ENCOUNTER — Other Ambulatory Visit: Payer: Self-pay | Admitting: Radiology

## 2012-04-24 ENCOUNTER — Other Ambulatory Visit: Payer: Self-pay | Admitting: Radiology

## 2012-04-25 ENCOUNTER — Other Ambulatory Visit (HOSPITAL_COMMUNITY): Payer: Self-pay | Admitting: Interventional Radiology

## 2012-04-25 ENCOUNTER — Encounter (HOSPITAL_COMMUNITY): Payer: Self-pay

## 2012-04-25 ENCOUNTER — Ambulatory Visit (HOSPITAL_COMMUNITY)
Admission: RE | Admit: 2012-04-25 | Discharge: 2012-04-25 | Disposition: A | Discharge status: Home / Self Care | Payer: Medicaid Other | Source: Ambulatory Visit | Attending: Interventional Radiology | Admitting: Interventional Radiology

## 2012-04-25 DIAGNOSIS — I729 Aneurysm of unspecified site: Secondary | ICD-10-CM

## 2012-04-25 DIAGNOSIS — Z09 Encounter for follow-up examination after completed treatment for conditions other than malignant neoplasm: Secondary | ICD-10-CM

## 2012-04-25 DIAGNOSIS — I2510 Atherosclerotic heart disease of native coronary artery without angina pectoris: Secondary | ICD-10-CM | POA: Insufficient documentation

## 2012-04-25 DIAGNOSIS — I671 Cerebral aneurysm, nonruptured: Secondary | ICD-10-CM | POA: Insufficient documentation

## 2012-04-25 DIAGNOSIS — E785 Hyperlipidemia, unspecified: Secondary | ICD-10-CM | POA: Insufficient documentation

## 2012-04-25 DIAGNOSIS — I252 Old myocardial infarction: Secondary | ICD-10-CM | POA: Insufficient documentation

## 2012-04-25 DIAGNOSIS — I10 Essential (primary) hypertension: Secondary | ICD-10-CM | POA: Insufficient documentation

## 2012-04-25 DIAGNOSIS — Z9889 Other specified postprocedural states: Secondary | ICD-10-CM | POA: Insufficient documentation

## 2012-04-25 LAB — BASIC METABOLIC PANEL
BUN: 9 mg/dL (ref 6–23)
CO2: 28 mEq/L (ref 19–32)
GFR calc non Af Amer: 61 mL/min — ABNORMAL LOW (ref >90)
Glucose, Bld: 103 mg/dL — ABNORMAL HIGH (ref 70–99)
Potassium: 4.2 mEq/L (ref 3.5–5.1)

## 2012-04-25 LAB — DIFFERENTIAL
Basophils Absolute: 0.1 10*3/uL (ref 0.0–0.1)
Lymphocytes Relative: 36 % (ref 12–46)
Neutro Abs: 3.9 10*3/uL (ref 1.7–7.7)

## 2012-04-25 LAB — CBC
HCT: 42 % (ref 36.0–46.0)
Hemoglobin: 14 g/dL (ref 12.0–15.0)
MCH: 32.2 pg (ref 26.0–34.0)
MCV: 96.6 fL (ref 78.0–100.0)
Platelets: 236 10*3/uL (ref 150–400)
RBC: 4.35 MIL/uL (ref 3.87–5.11)
RDW: 13.5 % (ref 11.5–15.5)

## 2012-04-25 LAB — PROTIME-INR
INR: 0.87 (ref 0.00–1.49)
Prothrombin Time: 12 seconds (ref 11.6–15.2)

## 2012-04-25 MED ORDER — HEPARIN SODIUM (PORCINE) 1000 UNIT/ML IJ SOLN
INTRAMUSCULAR | Status: DC | PRN
  Administered 2012-04-25: 500 [IU] via INTRAVENOUS

## 2012-04-25 MED ORDER — MIDAZOLAM HCL 5 MG/5ML IJ SOLN
INTRAMUSCULAR | Status: DC | PRN
  Administered 2012-04-25: 1 mg via INTRAVENOUS

## 2012-04-25 MED ORDER — SODIUM CHLORIDE 0.9 % IV SOLN: Freq: Once | INTRAVENOUS | Status: DC

## 2012-04-25 MED ORDER — IOHEXOL 300 MG/ML  SOLN: 150.0000 mL | Freq: Once | INTRAMUSCULAR | Status: AC | PRN

## 2012-04-25 MED ORDER — FENTANYL CITRATE 0.05 MG/ML IJ SOLN
INTRAMUSCULAR | Status: AC
  Filled 2012-04-25: qty 4

## 2012-04-25 MED ORDER — MIDAZOLAM HCL 2 MG/2ML IJ SOLN
INTRAMUSCULAR | Status: AC
  Filled 2012-04-25: qty 4

## 2012-04-25 MED ORDER — ASPIRIN 81 MG PO CHEW
CHEWABLE_TABLET | ORAL | Status: AC
  Filled 2012-04-25: qty 1

## 2012-04-25 MED ORDER — SODIUM CHLORIDE 0.9 % IV SOLN
INTRAVENOUS | Status: AC
  Administered 2012-04-25: 300 mL via INTRAVENOUS

## 2012-04-25 MED ORDER — FENTANYL CITRATE 0.05 MG/ML IJ SOLN
INTRAMUSCULAR | Status: DC | PRN
  Administered 2012-04-25: 25 ug via INTRAVENOUS

## 2012-04-25 MED ORDER — ASPIRIN 81 MG PO CHEW
CHEWABLE_TABLET | ORAL | Status: AC
  Administered 2012-04-25: 81 mg
  Filled 2012-04-25: qty 1

## 2012-04-25 NOTE — H&P (Signed)
Jill [name] is an 61 y.o. female.   Chief Complaint: previous basilar artery aneurysm stent/coil 09/2011 Scheduled for re check arteriogram in IR No complaints except one syncopal episode last week- none since HPI: CAD/CABG; HTN; GERD; basilar artery aneurysm  Past Medical History  Diagnosis Date  . Coronary artery disease     Prior inferior MI with stent to RCA, s/p CABG in 2008  . Hyperlipidemia   . Hypertension   . Fatigue   . Dizziness   . Hypothyroidism   . Normal nuclear stress test Ju;y 2012    No ischemia. EF 70%; fixed defect involving septum, inferoseptal and inferior wall  . Tobacco abuse   . Retroperitoneal bleeding     Following cardiac cath  . GERD (gastroesophageal reflux disease)   . Headache     UNRUPTURED CEREBRAL ANEURYSM  . Myocardial infarction   . Blood transfusion   . Anxiety     ON PAXIL, XANAX  . AVM (arteriovenous malformation) brain     s/p stent/coil    Past Surgical History  Procedure Date  . Cardiac catheterization 01/02/2007    IT REVEALS MILD INFERIOR WALL HYPOKINESIS. THE EJECTION FRACTION IS AROUND 50%  . Coronary artery bypass graft 2008    LIMA to LAD, SVG to DX, SVG to LCX & SVG to OM 1 & 2, and SVG to PD  . Coronary stent placement     Remote past stent to RCA  . Hemorrhoid surgery 1989  . Ventriculostomy 10/06/2011    Procedure: VENTRICULOSTOMY;  Surgeon: Carmela Hurt;  Location: [facility];  Service: Neurosurgery;  Laterality: Right;  Insertion of Ventriculostomy Catheter    Family History  Problem Relation Age of Onset  . Heart disease Neg Hx   . Cancer Mother 8  . Diabetes Mother   . Alcohol abuse Father   . Asthma Father   . Diabetes Brother   . Cancer Brother     lung cancer  . Cancer Maternal Aunt     ovarian ca  . Cancer Paternal Aunt     liver cancer   Social History:  reports that she has been smoking Cigarettes.  She has a 13.25 pack-year smoking history. She does not have any smokeless tobacco history  on file. She reports that she does not drink alcohol or use illicit drugs.  Allergies:  Allergies  Allergen Reactions  . Ace Inhibitors Cough  . Penicillins Rash  . Sulfa Drugs Cross Reactors Rash     (Not in a hospital admission)  No results found for this or any previous visit (from the past 48 hour(s)). No results found.  Review of Systems  Constitutional: Negative for fever and chills.  Eyes: Negative for blurred vision and double vision.  Respiratory: Negative for cough and shortness of breath.   Cardiovascular: Negative for chest pain.  Gastrointestinal: Negative for nausea and vomiting.  Neurological: Negative for dizziness, tingling and headaches.       One episode last week of syncope; going down stairs to do laundry- passed out and awoke at bottom of stairs. Time out is unknown but pt says "not long"  Psychiatric/Behavioral: The patient is not nervous/anxious.     Blood pressure 131/75, pulse 66, temperature 96.9 F (36.1 C), temperature source Oral, resp. rate 18, height 5\' 7"  (1.702 m), weight 156 lb (70.761 kg), SpO2 96.00%. Physical Exam  Constitutional: She is oriented to person, place, and time. She appears well-developed and well-nourished.  Eyes: EOM are  normal.  Cardiovascular: Normal rate, regular rhythm and normal heart sounds.   No murmur heard. Respiratory: Effort normal and breath sounds normal. She has no wheezes.  GI: Soft. Bowel sounds are normal. There is no tenderness.  Musculoskeletal: Normal range of motion.  Neurological: She is alert and oriented to person, place, and time. Coordination normal.  Skin: Skin is warm and dry.  Psychiatric: She has a normal mood and affect. Her behavior is normal. Judgment and thought content normal.     Assessment/Plan Previous basilar artery aneurysm stent/coil 09/2011 For re check cerebral arteriogram now Pt aware of procedure benefits and risks and agreeable to proceed. Consent in chart  Murray Durrell  A 04/25/2012, 7:40 AM

## 2012-04-25 NOTE — Discharge Instructions (Signed)

## 2012-04-25 NOTE — Procedures (Signed)
S/P RT CCA and bilateral vertebral  Artery angiograms.. RT CFA approach  Preliminary findings. 1. Nearly completely obliterated  Basilar  artery aneurysm. Stable 2.33mm rt ICA superior hypophyseal aneurysm. 50 % stenosis rt ICA cavernous segment

## 2012-05-30 ENCOUNTER — Other Ambulatory Visit: Payer: Self-pay | Admitting: Family Medicine

## 2012-06-05 ENCOUNTER — Encounter: Payer: Self-pay | Admitting: Family Medicine

## 2012-06-05 ENCOUNTER — Ambulatory Visit (INDEPENDENT_AMBULATORY_CARE_PROVIDER_SITE_OTHER): Payer: Medicaid Other | Admitting: Family Medicine

## 2012-06-05 VITALS — BP 122/72 | HR 67 | Ht 67.0 in | Wt 153.0 lb

## 2012-06-05 DIAGNOSIS — M25562 Pain in left knee: Secondary | ICD-10-CM | POA: Insufficient documentation

## 2012-06-05 DIAGNOSIS — E785 Hyperlipidemia, unspecified: Secondary | ICD-10-CM

## 2012-06-05 DIAGNOSIS — Z0000 Encounter for general adult medical examination without abnormal findings: Secondary | ICD-10-CM | POA: Insufficient documentation

## 2012-06-05 DIAGNOSIS — F17200 Nicotine dependence, unspecified, uncomplicated: Secondary | ICD-10-CM

## 2012-06-05 DIAGNOSIS — F411 Generalized anxiety disorder: Secondary | ICD-10-CM

## 2012-06-05 DIAGNOSIS — Z23 Encounter for immunization: Secondary | ICD-10-CM

## 2012-06-05 DIAGNOSIS — E039 Hypothyroidism, unspecified: Secondary | ICD-10-CM

## 2012-06-05 DIAGNOSIS — M25569 Pain in unspecified knee: Secondary | ICD-10-CM

## 2012-06-05 DIAGNOSIS — Z720 Tobacco use: Secondary | ICD-10-CM

## 2012-06-05 DIAGNOSIS — F419 Anxiety disorder, unspecified: Secondary | ICD-10-CM

## 2012-06-05 HISTORY — DX: Pain in left knee: M25.562

## 2012-06-05 MED ORDER — ZOSTER VACCINE LIVE 19400 UNT/0.65ML ~~LOC~~ SOLR: 0.6500 mL | Freq: Once | SUBCUTANEOUS | Status: AC

## 2012-06-05 MED ORDER — PAROXETINE HCL 40 MG PO TABS: 40.0000 mg | ORAL_TABLET | ORAL | Status: DC

## 2012-06-05 NOTE — Assessment & Plan Note (Signed)
Knee strain.  No evidence of tendon rupture, inflammatory arthropathy, infection, or DVT.  Supportive care with ice, tylenol.  Discussed red flags for return.

## 2012-06-05 NOTE — Assessment & Plan Note (Addendum)
Will recheck direct LDL today as she has been on new statin for 2 months (atorvastatin 40)

## 2012-06-05 NOTE — Patient Instructions (Addendum)
Will check your thyroid and cholesterol labs today  Make follow-up for gynecological exam  If we can help with quitting smoking- please consider scheduling an appt with Dr. Raymondo Band  Use ice and tylenol to help with knee pain- let me know if you notice worsening swelling, or redness, other concerns  See info on scheduling mammogram  See prescription for zostavax  You got your TDAP shot today

## 2012-06-05 NOTE — Assessment & Plan Note (Signed)
Gave tdap today, gave rx for zostavax, and info to schedule mammogram  Will follow-up for gynecological exam (pap)

## 2012-06-05 NOTE — Assessment & Plan Note (Signed)
Was markedly elevated last check in May, increased synthroid.  Will recheck today

## 2012-06-05 NOTE — Assessment & Plan Note (Signed)
Refilled paroxetine at previous higher dose of 40 mg daily.

## 2012-06-05 NOTE — Progress Notes (Signed)
  Subjective:    Patient ID: Jill [name], female    DOB: 07-14-1951, 61 y.o.   MRN: [id number]  HPI Here for follow-up and for acute knee pain  Left knee pain:  Has has intermittent knee pain in this knee before.  2 days ago started having knee pain again but felt different.   No new injury or overuse  Noted in past few days.  Feels popping and stabbing knee pain in front and back of knee is swollen.   Has been walking on it ok.  Harder to go down steps.    HYPERLIPIDEMIA- Cardiologist changed simvastatin to atorvastatin 2 months ago, patient states has been compliant.  Diet: Not following low cholesterol diet Exercise: No regular exercise Wt Readings from Last 3 Encounters:  06/05/12 153 lb (69.4 kg)  04/25/12 156 lb (70.761 kg)  04/05/12 154 lb 12.8 oz (70.217 kg)   ROS:  Denies RUQ pain, myalgias, or symptoms or coronary ischemia Lab Results  Component Value Date   LDLCALC 163* 03/11/2012   Lab Results  Component Value Date   CHOL 247* 03/11/2012   CHOL 121 05/13/2011   CHOL 120 03/23/2011   Lab Results  Component Value Date   HDL 39* 03/11/2012   HDL 43 07/11/6386   HDL 36.70* 03/23/2011   Lab Results  Component Value Date   TRIG 226* 03/11/2012   TRIG 45 05/13/2011   TRIG 126.0 03/23/2011   Lab Results  Component Value Date   ALT 18 03/11/2012   AST 28 03/11/2012   ALKPHOS 57 03/11/2012   BILITOT 0.4 03/11/2012    Hypothyroidism:  Taking synthroid daily.     Review of Systems See HPI    Objective:   Physical Exam GEN: Alert & Oriented, No acute distress CV:  Regular Rate & Rhythm, no murmur Respiratory:  Normal work of breathing, CTAB Abd:  + BS, soft, no tenderness to palpation Ext: no pre-tibial edema MSK: left knee:  No erythema or warmth.  No ttp over patella, joint line, posteriorly.  No increased laxity. Small effusion superiorly.         Assessment & Plan:

## 2012-06-05 NOTE — Assessment & Plan Note (Signed)
Has cute back from 2 PPD before aneurysm to 1 PPD.  Not sure about cutting back further.  Made aware of counseling services we provide here when she is ready.

## 2012-06-06 ENCOUNTER — Telehealth: Payer: Self-pay | Admitting: Family Medicine

## 2012-06-06 DIAGNOSIS — E039 Hypothyroidism, unspecified: Secondary | ICD-10-CM

## 2012-06-06 LAB — TSH: TSH: 8.539 u[IU]/mL — ABNORMAL HIGH (ref 0.350–4.500)

## 2012-06-06 LAB — LDL CHOLESTEROL, DIRECT: Direct LDL: 74 mg/dL

## 2012-06-06 MED ORDER — LEVOTHYROXINE SODIUM 125 MCG PO TABS: 125.0000 ug | ORAL_TABLET | Freq: Every day | ORAL | Status: DC

## 2012-06-06 NOTE — Assessment & Plan Note (Signed)
Will increase from 112 to 125 see documentation.  Recheck in 6-8 weeks

## 2012-06-06 NOTE — Telephone Encounter (Signed)
Discussed elevated TSH with patient, has been very compliant and consistent with how she is taking synthroid 112.  Will increase to 125, discussed angina precautions.  Will come back for blood draw in 6-8 weeks.

## 2012-07-11 ENCOUNTER — Other Ambulatory Visit: Payer: Self-pay

## 2012-08-03 ENCOUNTER — Other Ambulatory Visit: Payer: Self-pay | Admitting: Cardiovascular Disease

## 2012-08-06 NOTE — Telephone Encounter (Signed)
Fax Received. Refill Completed. Cruz Devilla Chowoe (R.M.A)   

## 2012-08-07 ENCOUNTER — Other Ambulatory Visit: Payer: Self-pay | Admitting: *Deleted

## 2012-08-07 NOTE — Telephone Encounter (Signed)
Opened in Error.

## 2012-08-16 ENCOUNTER — Other Ambulatory Visit (HOSPITAL_COMMUNITY): Payer: Self-pay | Admitting: Interventional Radiology

## 2012-08-16 DIAGNOSIS — I671 Cerebral aneurysm, nonruptured: Secondary | ICD-10-CM

## 2012-10-09 ENCOUNTER — Other Ambulatory Visit: Payer: Self-pay | Admitting: *Deleted

## 2012-10-09 ENCOUNTER — Encounter (HOSPITAL_COMMUNITY): Payer: Self-pay | Admitting: Pharmacy Technician

## 2012-10-09 MED ORDER — METOPROLOL TARTRATE 50 MG PO TABS: 50.0000 mg | ORAL_TABLET | Freq: Every day | ORAL | Status: DC

## 2012-10-09 MED ORDER — HYDROCHLOROTHIAZIDE 25 MG PO TABS: 25.0000 mg | ORAL_TABLET | Freq: Every day | ORAL | Status: DC

## 2012-10-09 NOTE — Telephone Encounter (Signed)
Fax Received. Refill Completed. Jill [name] (R.M.A)   

## 2012-10-10 ENCOUNTER — Other Ambulatory Visit: Payer: Self-pay | Admitting: Physician Assistant

## 2012-10-15 ENCOUNTER — Ambulatory Visit (HOSPITAL_COMMUNITY)
Admission: RE | Admit: 2012-10-15 | Discharge: 2012-10-15 | Disposition: A | Discharge status: Home / Self Care | Payer: Medicaid Other | Source: Ambulatory Visit | Attending: Interventional Radiology | Admitting: Interventional Radiology

## 2012-10-15 DIAGNOSIS — I671 Cerebral aneurysm, nonruptured: Secondary | ICD-10-CM | POA: Insufficient documentation

## 2012-10-15 DIAGNOSIS — Z5309 Procedure and treatment not carried out because of other contraindication: Secondary | ICD-10-CM | POA: Insufficient documentation

## 2012-10-15 DIAGNOSIS — Z09 Encounter for follow-up examination after completed treatment for conditions other than malignant neoplasm: Secondary | ICD-10-CM | POA: Insufficient documentation

## 2012-10-15 DIAGNOSIS — Z9889 Other specified postprocedural states: Secondary | ICD-10-CM | POA: Insufficient documentation

## 2012-10-15 DIAGNOSIS — K047 Periapical abscess without sinus: Secondary | ICD-10-CM | POA: Insufficient documentation

## 2012-10-15 MED ORDER — SODIUM CHLORIDE 0.9 % IV SOLN: INTRAVENOUS | Status: DC

## 2012-10-15 NOTE — Progress Notes (Signed)
Patient ID: Jill [name], female   DOB: 1951-09-13, 61 y.o.   MRN: [id number]   Basilar artery aneurysm coil/stent placed 10/2011 Scheduled for f/u angio today Presents with productive cough - green sputum Nasal congestion and greenish mucus Tooth abscess - fluctuant area near base of lower left teeth afeb  Discussed with Dr Corliss Skains Will reschedule procedure for late next week  Pt allergic to Amox; Sulfa and Zithromycin  Discussed with pharmacist Rec: Clindamycin 300 mg q 6 hrs #40 Rx given Pt will hear from scheduler to reschedule procedure

## 2012-10-15 NOTE — Progress Notes (Signed)
Pt states she is sick and has a tooth abcess and a sinus infection.  Pam Turpin,P.A. Assessed pt. Pt given a perscription a procedure postponed

## 2012-10-18 ENCOUNTER — Encounter (HOSPITAL_COMMUNITY): Payer: Self-pay | Admitting: Respiratory Therapy

## 2012-10-18 ENCOUNTER — Other Ambulatory Visit: Payer: Self-pay | Admitting: Radiology

## 2012-10-23 ENCOUNTER — Other Ambulatory Visit: Payer: Self-pay | Admitting: Radiology

## 2012-10-24 ENCOUNTER — Ambulatory Visit (HOSPITAL_COMMUNITY): Admission: RE | Admit: 2012-10-24 | Payer: Medicaid Other | Source: Ambulatory Visit

## 2013-02-22 ENCOUNTER — Other Ambulatory Visit: Payer: Self-pay | Admitting: Family Medicine

## 2013-02-28 ENCOUNTER — Other Ambulatory Visit: Payer: Self-pay | Admitting: *Deleted

## 2013-02-28 MED ORDER — AMLODIPINE BESYLATE 10 MG PO TABS: 10.0000 mg | ORAL_TABLET | Freq: Every day | ORAL | Status: DC

## 2013-03-26 ENCOUNTER — Other Ambulatory Visit: Payer: Self-pay | Admitting: *Deleted

## 2013-03-26 MED ORDER — HYDROCHLOROTHIAZIDE 25 MG PO TABS: 25.0000 mg | ORAL_TABLET | Freq: Every day | ORAL | Status: DC

## 2013-03-26 MED ORDER — ATORVASTATIN CALCIUM 40 MG PO TABS: 40.0000 mg | ORAL_TABLET | Freq: Every day | ORAL | Status: DC

## 2013-03-26 MED ORDER — METOPROLOL TARTRATE 50 MG PO TABS: 50.0000 mg | ORAL_TABLET | Freq: Every day | ORAL | Status: DC

## 2013-03-26 NOTE — Telephone Encounter (Signed)
Fax Received. Refill Completed. Jill [name] (R.M.A)  Need appointment 

## 2013-03-26 NOTE — Telephone Encounter (Signed)
Need appointment Fax Received. Refill Completed. Jill [name] (R.M.A)   

## 2013-03-26 NOTE — Telephone Encounter (Signed)
Need appointment Fax Received. Refill Completed. Gatsby Chismar Chowoe (R.M.A)

## 2013-04-07 ENCOUNTER — Other Ambulatory Visit: Payer: Self-pay | Admitting: Emergency Medicine

## 2013-04-07 MED ORDER — HYDROCHLOROTHIAZIDE 25 MG PO TABS: 25.0000 mg | ORAL_TABLET | Freq: Every day | ORAL | Status: DC

## 2013-04-07 MED ORDER — LOSARTAN POTASSIUM 100 MG PO TABS: 100.0000 mg | ORAL_TABLET | Freq: Every day | ORAL | Status: DC

## 2013-04-07 MED ORDER — ATORVASTATIN CALCIUM 40 MG PO TABS: 40.0000 mg | ORAL_TABLET | Freq: Every day | ORAL | Status: DC

## 2013-04-07 MED ORDER — AMLODIPINE BESYLATE 10 MG PO TABS: 10.0000 mg | ORAL_TABLET | Freq: Every day | ORAL | Status: DC

## 2013-04-07 MED ORDER — METOPROLOL TARTRATE 50 MG PO TABS: 50.0000 mg | ORAL_TABLET | Freq: Every day | ORAL | Status: DC

## 2013-04-07 MED ORDER — POTASSIUM CHLORIDE ER 10 MEQ PO TBCR: 10.0000 meq | EXTENDED_RELEASE_TABLET | Freq: Every day | ORAL | Status: DC

## 2013-05-22 ENCOUNTER — Ambulatory Visit (INDEPENDENT_AMBULATORY_CARE_PROVIDER_SITE_OTHER): Payer: Self-pay | Admitting: Cardiovascular Disease

## 2013-05-22 ENCOUNTER — Ambulatory Visit (INDEPENDENT_AMBULATORY_CARE_PROVIDER_SITE_OTHER): Payer: Self-pay | Admitting: Family Medicine

## 2013-05-22 ENCOUNTER — Encounter: Payer: Self-pay | Admitting: Family Medicine

## 2013-05-22 ENCOUNTER — Encounter: Payer: Self-pay | Admitting: Cardiovascular Disease

## 2013-05-22 VITALS — BP 124/78 | HR 76 | Temp 97.9°F | Wt 163.0 lb

## 2013-05-22 VITALS — BP 132/74 | HR 66 | Ht 67.0 in | Wt 163.8 lb

## 2013-05-22 DIAGNOSIS — Z720 Tobacco use: Secondary | ICD-10-CM

## 2013-05-22 DIAGNOSIS — F17200 Nicotine dependence, unspecified, uncomplicated: Secondary | ICD-10-CM

## 2013-05-22 DIAGNOSIS — I10 Essential (primary) hypertension: Secondary | ICD-10-CM

## 2013-05-22 DIAGNOSIS — F411 Generalized anxiety disorder: Secondary | ICD-10-CM

## 2013-05-22 DIAGNOSIS — K30 Functional dyspepsia: Secondary | ICD-10-CM | POA: Insufficient documentation

## 2013-05-22 DIAGNOSIS — E039 Hypothyroidism, unspecified: Secondary | ICD-10-CM

## 2013-05-22 DIAGNOSIS — R1013 Epigastric pain: Secondary | ICD-10-CM

## 2013-05-22 DIAGNOSIS — F419 Anxiety disorder, unspecified: Secondary | ICD-10-CM

## 2013-05-22 DIAGNOSIS — E785 Hyperlipidemia, unspecified: Secondary | ICD-10-CM

## 2013-05-22 DIAGNOSIS — K3189 Other diseases of stomach and duodenum: Secondary | ICD-10-CM

## 2013-05-22 DIAGNOSIS — I2510 Atherosclerotic heart disease of native coronary artery without angina pectoris: Secondary | ICD-10-CM

## 2013-05-22 DIAGNOSIS — G4700 Insomnia, unspecified: Secondary | ICD-10-CM

## 2013-05-22 MED ORDER — METOPROLOL TARTRATE 50 MG PO TABS: 50.0000 mg | ORAL_TABLET | Freq: Every day | ORAL | Status: DC

## 2013-05-22 MED ORDER — HYDROCHLOROTHIAZIDE 25 MG PO TABS: 25.0000 mg | ORAL_TABLET | Freq: Every day | ORAL | Status: DC

## 2013-05-22 MED ORDER — LEVOTHYROXINE SODIUM 125 MCG PO TABS: 125.0000 ug | ORAL_TABLET | Freq: Every day | ORAL | Status: DC

## 2013-05-22 MED ORDER — NITROGLYCERIN 0.4 MG SL SUBL: 0.4000 mg | SUBLINGUAL_TABLET | SUBLINGUAL | Status: AC | PRN

## 2013-05-22 MED ORDER — POTASSIUM CHLORIDE ER 10 MEQ PO TBCR: 10.0000 meq | EXTENDED_RELEASE_TABLET | Freq: Every day | ORAL | Status: DC

## 2013-05-22 MED ORDER — LOSARTAN POTASSIUM 100 MG PO TABS: 100.0000 mg | ORAL_TABLET | Freq: Every day | ORAL | Status: DC

## 2013-05-22 MED ORDER — PAROXETINE HCL 40 MG PO TABS: 40.0000 mg | ORAL_TABLET | ORAL | Status: DC

## 2013-05-22 MED ORDER — AMLODIPINE BESYLATE 10 MG PO TABS: 10.0000 mg | ORAL_TABLET | Freq: Every day | ORAL | Status: DC

## 2013-05-22 MED ORDER — ATORVASTATIN CALCIUM 40 MG PO TABS: 40.0000 mg | ORAL_TABLET | Freq: Every day | ORAL | Status: DC

## 2013-05-22 MED ORDER — HYDROXYZINE HCL 50 MG PO TABS: 50.0000 mg | ORAL_TABLET | Freq: Every evening | ORAL | Status: DC | PRN

## 2013-05-22 NOTE — Assessment & Plan Note (Signed)
Stable. I refilled synthroid for 1 month. F/U TSH at cardiologist office to determine if she will need dose adjustment.

## 2013-05-22 NOTE — Assessment & Plan Note (Signed)
Patient need FLP,not fasting today. She has appointment next Monday at the cardiologist's office for blood work. I will review when available.

## 2013-05-22 NOTE — Patient Instructions (Addendum)
Your physician wants you to follow-up in: 1 year  You will receive a reminder letter in the mail two months in advance. If you don't receive a letter, please call our office to schedule the follow-up appointment.  Your physician recommends that you continue on your current medications as directed. Please refer to the Current Medication list given to you today.  Your physician recommends that you return for a FASTING lipid profile: bmet lipid hepatic this Monday

## 2013-05-22 NOTE — Assessment & Plan Note (Signed)
BP optimal. I reviewed all her medications,refill was done by cardiologist today.

## 2013-05-22 NOTE — Progress Notes (Signed)
Jill [name] Date of Birth  04-16-1951       [facility]    [place] [address], [address]  [address] [place], [place]  [address]   [place], [place]  [address] [phone]     [phone]   Fax  [phone]    Fax [phone]  Problem List: 1. Coronary disease-status post CABG 2. Hypothyroidism 3. Hyperlipidemia 4. Hypertension 5. brainstem AV malformation with coiling. She had an intracerebral bleed. She was hospitalized for about a month from November to December 2012.  10/05/2011-stent-assisted coiling of basilar artery apex aneurysm under general anesthesia ; 10/06/2011- right frontal ventriculostomy by Dr. Franky Macho   History of Present Illness:  Jill [name] is a 62 year old female with the above-noted medical history. She has had a prolonged hospitalization since that she last saw me last year. She was hospitalized after having an episode of syncope. She was found to have an AV malformation. She underwent a stent-assisted coiling but didn't apparently had some bleeding. She then had to have a right frontal ventriculostomy.  She states that her memory is not as good as it used to be but otherwise she has made a fairly good recovery.  She is scheduled to have a cerebral angiogram performed in June.  A cardiac standpoint she seems to be doing well. She's not having any episodes of chest pain or shortness breath. She still smokes one pack cigarettes a day.  July 2014:   Jill [name] is doing well.  No CP .  Works out in the garden regularly.  Still smoking - still shortness of breath.   Current Outpatient Prescriptions on File Prior to Visit  Medication Sig Dispense Refill  . amLODipine (NORVASC) 10 MG tablet Take 1 tablet (10 mg total) by mouth daily.  30 tablet  2  . aspirin EC 81 MG tablet Take 81 mg by mouth daily.      Marland Kitchen atorvastatin (LIPITOR) 40 MG tablet Take 1 tablet (40 mg total) by mouth daily.  30 tablet  2  . calcium carbonate (TUMS -  DOSED IN MG ELEMENTAL CALCIUM) 500 MG chewable tablet Chew 2 tablets by mouth daily as needed. heartburn      . hydrochlorothiazide (HYDRODIURIL) 25 MG tablet Take 1 tablet (25 mg total) by mouth daily.  30 tablet  2  . levothyroxine (SYNTHROID, LEVOTHROID) 125 MCG tablet TAKE ONE TABLET ( TOTAL) BY MOUTH EVERY DAY  30 tablet  1  . losartan (COZAAR) 100 MG tablet Take 1 tablet (100 mg total) by mouth daily.  30 tablet  2  . metoprolol (LOPRESSOR) 50 MG tablet Take 1 tablet (50 mg total) by mouth daily.  30 tablet  2  . nitroGLYCERIN (NITROSTAT) 0.4 MG SL tablet Place 0.4 mg under the tongue every 5 (five) minutes as needed. For chest pain      . PARoxetine (PAXIL) 40 MG tablet Take 1 tablet (40 mg total) by mouth every morning.  30 tablet  11  . potassium chloride (K-DUR) 10 MEQ tablet Take 1 tablet (10 mEq total) by mouth daily.  90 tablet  2   No current facility-administered medications on file prior to visit.    Allergies  Allergen Reactions  . Ace Inhibitors Cough  . Penicillins Rash  . Sulfa Drugs Cross Reactors Rash    Past Medical History  Diagnosis Date  . Coronary artery disease     Prior inferior MI with stent to RCA, s/p CABG  in 2008  . Hyperlipidemia   . Hypertension   . Fatigue   . Dizziness   . Hypothyroidism   . Normal nuclear stress test Ju;y 2012    No ischemia. EF 70%; fixed defect involving septum, inferoseptal and inferior wall  . Tobacco abuse   . Retroperitoneal bleeding     Following cardiac cath  . GERD (gastroesophageal reflux disease)   . Headache(784.0)     UNRUPTURED CEREBRAL ANEURYSM  . Myocardial infarction   . Blood transfusion   . Anxiety     ON PAXIL, XANAX  . AVM (arteriovenous malformation) brain     s/p stent/coil    Past Surgical History  Procedure Laterality Date  . Cardiac catheterization  01/02/2007    IT REVEALS MILD INFERIOR WALL HYPOKINESIS. THE EJECTION FRACTION IS AROUND 50%  . Coronary artery bypass graft  2008     LIMA to LAD, SVG to DX, SVG to LCX & SVG to OM 1 & 2, and SVG to PD  . Coronary stent placement      Remote past stent to RCA  . Hemorrhoid surgery  1989  . Ventriculostomy  10/06/2011    Procedure: VENTRICULOSTOMY;  Surgeon: Carmela Hurt;  Location: [facility];  Service: Neurosurgery;  Laterality: Right;  Insertion of Ventriculostomy Catheter    History  Smoking status  . Current Every Day Smoker -- 1.00 packs/day for 53 years  . Types: Cigarettes  Smokeless tobacco  . Not on file    History  Alcohol Use No    Family History  Problem Relation Age of Onset  . Heart disease Neg Hx   . Cancer Mother 71  . Diabetes Mother   . Alcohol abuse Father   . Asthma Father   . Diabetes Brother   . Cancer Brother     lung cancer  . Cancer Maternal Aunt     ovarian ca  . Cancer Paternal Aunt     liver cancer    Reviw of Systems:  Reviewed in the HPI.  All other systems are negative.  Physical Exam: Blood pressure 132/74, pulse 66, height 5\' 7"  (1.702 m), weight 163 lb 12.8 oz (74.299 kg). General: Well developed, well nourished, in no acute distress.  Head: Normocephalic, atraumatic, sclera non-icteric, mucus membranes are moist,   Neck: Supple. Carotids are 2 + without bruits. No JVD  Lungs: Clear bilaterally to auscultation.  Heart: regular rate.  normal  S1 S2. No murmurs, gallops or rubs.  Abdomen: Soft, non-tender, non-distended with normal bowel sounds. No hepatomegaly. No rebound/guarding. No masses.  Msk:  Strength and tone are normal  Extremities: No clubbing or cyanosis. No edema.  Distal pedal pulses are 2+ and equal bilaterally.  Neuro: Alert and oriented X 3. Moves all extremities spontaneously.  Psych:  Responds to questions appropriately with a normal affect.  ECG: May 22, 2013:  NSR at 65,  LBBB.  Assessment / Plan:

## 2013-05-22 NOTE — Assessment & Plan Note (Signed)
Started on hydroxyzine. I recommended d/c Zanax due to addictive nature. Last dose 4 days ago with no withdrawal symptoms.

## 2013-05-22 NOTE — Assessment & Plan Note (Signed)
Advised to avoid trigger. I recommend colonoscopy which she had not had done. We will discuss this at next visit.

## 2013-05-22 NOTE — Progress Notes (Addendum)
Subjective:     Patient ID: DION PARROW, female   DOB: 08-07-1951, 62 y.o.   MRN: [id number]  HPI HTN/HLD: Patient here for follow up,denies any concern,she was seen by her cardiologist today who refilled her BP and cholesterol medications. Hypothyroidism:Denies any problem,need synthroid refill. Insomnia/Anxiety:Patient stated she had been on Xanax for insomnia and anxiety,she is also on Paxil,she is requesting refill,last dose of Xanax was 4 days ago,she does not use it frequently. GI upset: Patient having some epigastric discomfort with gassing,this started yesterday after eating onion,this is a normal patter for her but she like onion. She denies n/v,no change in bowel habit.  Current Outpatient Prescriptions on File Prior to Visit  Medication Sig Dispense Refill  . amLODipine (NORVASC) 10 MG tablet Take 1 tablet (10 mg total) by mouth daily.  90 tablet  3  . aspirin EC 81 MG tablet Take 81 mg by mouth daily.      Marland Kitchen atorvastatin (LIPITOR) 40 MG tablet Take 1 tablet (40 mg total) by mouth daily.  90 tablet  3  . calcium carbonate (TUMS - DOSED IN MG ELEMENTAL CALCIUM) 500 MG chewable tablet Chew 2 tablets by mouth daily as needed. heartburn      . hydrochlorothiazide (HYDRODIURIL) 25 MG tablet Take 1 tablet (25 mg total) by mouth daily.  90 tablet  3  . levothyroxine (SYNTHROID, LEVOTHROID) 125 MCG tablet TAKE ONE TABLET ( TOTAL) BY MOUTH EVERY DAY  30 tablet  1  . losartan (COZAAR) 100 MG tablet Take 1 tablet (100 mg total) by mouth daily.  30 tablet  2  . metoprolol (LOPRESSOR) 50 MG tablet Take 1 tablet (50 mg total) by mouth daily.  90 tablet  3  . nitroGLYCERIN (NITROSTAT) 0.4 MG SL tablet Place 1 tablet (0.4 mg total) under the tongue every 5 (five) minutes as needed. For chest pain  25 tablet  6  . PARoxetine (PAXIL) 40 MG tablet Take 1 tablet (40 mg total) by mouth every morning.  30 tablet  11  . potassium chloride (K-DUR) 10 MEQ tablet Take 1 tablet (10 mEq total) by mouth  daily.  90 tablet  3   No current facility-administered medications on file prior to visit.   Past Medical History  Diagnosis Date  . Coronary artery disease     Prior inferior MI with stent to RCA, s/p CABG in 2008  . Hyperlipidemia   . Hypertension   . Fatigue   . Dizziness   . Hypothyroidism   . Normal nuclear stress test Ju;y 2012    No ischemia. EF 70%; fixed defect involving septum, inferoseptal and inferior wall  . Tobacco abuse   . Retroperitoneal bleeding     Following cardiac cath  . GERD (gastroesophageal reflux disease)   . Headache(784.0)     UNRUPTURED CEREBRAL ANEURYSM  . Myocardial infarction   . Blood transfusion   . Anxiety     ON PAXIL, XANAX  . AVM (arteriovenous malformation) brain     s/p stent/coil     Review of Systems  Respiratory: Negative.   Cardiovascular: Negative.   Gastrointestinal: Negative.   Neurological: Negative.   Psychiatric/Behavioral: Positive for sleep disturbance. The patient is not nervous/anxious.   All other systems reviewed and are negative.   Filed Vitals:   05/22/13 1447  BP: 124/78  Pulse: 76  Temp: 97.9 F (36.6 C)  TempSrc: Oral  Weight: 163 lb (73.936 kg)       Objective:  Physical Exam  Nursing note and vitals reviewed. Constitutional: She is oriented to person, place, and time. She appears well-developed. No distress.  Cardiovascular: Normal rate, regular rhythm, normal heart sounds and intact distal pulses.   No murmur heard. Pulmonary/Chest: Effort normal and breath sounds normal. No respiratory distress. She has no wheezes.  Abdominal: Soft. Bowel sounds are normal. She exhibits no distension. There is no tenderness.  Musculoskeletal: Normal range of motion.  Neurological: She is alert and oriented to person, place, and time.  Psychiatric: She has a normal mood and affect. Her behavior is normal. Judgment and thought content normal.       Assessment/Plan:

## 2013-05-22 NOTE — Assessment & Plan Note (Addendum)
Jill [name] is about the same.  She is still smoking.  Denies any CP .  chronically short of breath - especially in the hot weather.   i have encouraged her to stop smoking.      She will be seeing her medical doctor later today.

## 2013-05-22 NOTE — Patient Instructions (Addendum)

## 2013-05-22 NOTE — Assessment & Plan Note (Signed)
I refilled her Paxil. Started hydroxyzine prn. Recommend against Xanax since it is addictive.

## 2013-05-22 NOTE — Assessment & Plan Note (Signed)
She is still smoking.  i have advised him to stop.

## 2013-05-26 ENCOUNTER — Other Ambulatory Visit: Payer: Self-pay

## 2013-06-10 ENCOUNTER — Telehealth: Payer: Self-pay | Admitting: *Deleted

## 2013-06-10 DIAGNOSIS — E785 Hyperlipidemia, unspecified: Secondary | ICD-10-CM

## 2013-06-10 DIAGNOSIS — Z720 Tobacco use: Secondary | ICD-10-CM

## 2013-06-10 DIAGNOSIS — I2510 Atherosclerotic heart disease of native coronary artery without angina pectoris: Secondary | ICD-10-CM

## 2013-06-10 MED ORDER — ATORVASTATIN CALCIUM 40 MG PO TABS: 40.0000 mg | ORAL_TABLET | Freq: Every day | ORAL | Status: DC

## 2013-06-10 NOTE — Telephone Encounter (Signed)
CALLED PHARMACY CANCELLED REFILLS OF ATORVASTATIN, MISSED LAB DATE, HER PCP IS NEEDING TSH ALSO WAITING ON LABS FOR REFILL.

## 2013-06-10 NOTE — Telephone Encounter (Signed)
PT DID NOT KEEP LAB DATE/ MSG WITH DAUGHTER TO HAVE HER CALL BACK TO SCHEDULE LAB DATE

## 2013-06-16 ENCOUNTER — Telehealth: Payer: Self-pay | Admitting: *Deleted

## 2013-06-16 NOTE — Telephone Encounter (Signed)
Tried to contact/ number goes into a fax sound after several rings.

## 2013-06-16 NOTE — Telephone Encounter (Signed)
Needs labs done for refill on lipitor. Called pharmacy to adjust refills/ needs bmet lipid liver.

## 2013-06-30 NOTE — Telephone Encounter (Signed)
Called her/ lab date set 07/11/13

## 2013-07-10 ENCOUNTER — Ambulatory Visit (INDEPENDENT_AMBULATORY_CARE_PROVIDER_SITE_OTHER): Payer: Self-pay | Admitting: *Deleted

## 2013-07-10 DIAGNOSIS — E039 Hypothyroidism, unspecified: Secondary | ICD-10-CM

## 2013-07-10 DIAGNOSIS — E785 Hyperlipidemia, unspecified: Secondary | ICD-10-CM

## 2013-07-10 DIAGNOSIS — I2510 Atherosclerotic heart disease of native coronary artery without angina pectoris: Secondary | ICD-10-CM

## 2013-07-10 DIAGNOSIS — I10 Essential (primary) hypertension: Secondary | ICD-10-CM

## 2013-07-10 LAB — LIPID PANEL
Cholesterol: 166 mg/dL (ref 0–200)
Total CHOL/HDL Ratio: 5
Triglycerides: 203 mg/dL — ABNORMAL HIGH (ref 0.0–149.0)
VLDL: 40.6 mg/dL — ABNORMAL HIGH (ref 0.0–40.0)

## 2013-07-10 LAB — LDL CHOLESTEROL, DIRECT: Direct LDL: 101.2 mg/dL

## 2013-07-10 LAB — HEPATIC FUNCTION PANEL
AST: 24 U/L (ref 0–37)
Albumin: 4.1 g/dL (ref 3.5–5.2)
Total Bilirubin: 0.7 mg/dL (ref 0.3–1.2)

## 2013-07-10 LAB — BASIC METABOLIC PANEL
BUN: 12 mg/dL (ref 6–23)
CO2: 31 mEq/L (ref 19–32)
Chloride: 103 mEq/L (ref 96–112)
Creatinine, Ser: 1.1 mg/dL (ref 0.4–1.2)
Potassium: 3.6 mEq/L (ref 3.5–5.1)

## 2013-07-10 LAB — T4, FREE: Free T4: 0.74 ng/dL (ref 0.60–1.60)

## 2013-07-10 LAB — TSH: TSH: 16.17 u[IU]/mL — ABNORMAL HIGH (ref 0.35–5.50)

## 2013-07-11 ENCOUNTER — Other Ambulatory Visit: Payer: Self-pay

## 2013-07-11 ENCOUNTER — Telehealth: Payer: Self-pay | Admitting: *Deleted

## 2013-07-11 DIAGNOSIS — Z720 Tobacco use: Secondary | ICD-10-CM

## 2013-07-11 DIAGNOSIS — E785 Hyperlipidemia, unspecified: Secondary | ICD-10-CM

## 2013-07-11 DIAGNOSIS — I2510 Atherosclerotic heart disease of native coronary artery without angina pectoris: Secondary | ICD-10-CM

## 2013-07-11 MED ORDER — ATORVASTATIN CALCIUM 40 MG PO TABS: 40.0000 mg | ORAL_TABLET | Freq: Every day | ORAL | Status: DC

## 2013-07-11 NOTE — Telephone Encounter (Signed)
Pt called with her results/ then forwarded to pcp.

## 2013-07-11 NOTE — Telephone Encounter (Signed)
Message copied by Antony Odea on Fri Jul 11, 2013 12:30 PM ------      Message from: Vesta Mixer      Created: Thu Jul 10, 2013  7:38 PM       LABS ARE OK             ------

## 2013-07-16 ENCOUNTER — Telehealth: Payer: Self-pay | Admitting: Family Medicine

## 2013-07-16 NOTE — Telephone Encounter (Signed)
Patient is yet to make follow up for her hypothyroidism. I called to check,message left with her daughter to call  Back. I called her pharmacy,she did not pick up any synthroid from the pharmacy between May 2014 and July 2014,her TSH was checked in Sept,but guess is since she restarted Synthroid in July,there should have been some improvement in her thyroid function test. I am inclined to going up on her Synthroid but will need to discuss this with her to confirm compliance or non-compliance with medication.

## 2013-08-15 ENCOUNTER — Other Ambulatory Visit: Payer: Self-pay | Admitting: Family Medicine

## 2013-08-15 DIAGNOSIS — Z1211 Encounter for screening for malignant neoplasm of colon: Secondary | ICD-10-CM

## 2013-08-15 DIAGNOSIS — E039 Hypothyroidism, unspecified: Secondary | ICD-10-CM

## 2013-08-19 ENCOUNTER — Ambulatory Visit: Payer: Self-pay | Admitting: Family Medicine

## 2013-09-09 ENCOUNTER — Ambulatory Visit: Payer: Self-pay | Admitting: Family Medicine

## 2013-09-18 ENCOUNTER — Ambulatory Visit (INDEPENDENT_AMBULATORY_CARE_PROVIDER_SITE_OTHER): Payer: Self-pay | Admitting: Family Medicine

## 2013-09-18 ENCOUNTER — Encounter: Payer: Self-pay | Admitting: Family Medicine

## 2013-09-18 VITALS — BP 123/73 | HR 67 | Temp 98.4°F | Ht 67.0 in | Wt 164.0 lb

## 2013-09-18 DIAGNOSIS — R82998 Other abnormal findings in urine: Secondary | ICD-10-CM

## 2013-09-18 DIAGNOSIS — L089 Local infection of the skin and subcutaneous tissue, unspecified: Secondary | ICD-10-CM

## 2013-09-18 DIAGNOSIS — E039 Hypothyroidism, unspecified: Secondary | ICD-10-CM

## 2013-09-18 DIAGNOSIS — R3989 Other symptoms and signs involving the genitourinary system: Secondary | ICD-10-CM | POA: Insufficient documentation

## 2013-09-18 DIAGNOSIS — I10 Essential (primary) hypertension: Secondary | ICD-10-CM

## 2013-09-18 DIAGNOSIS — L723 Sebaceous cyst: Secondary | ICD-10-CM

## 2013-09-18 HISTORY — DX: Local infection of the skin and subcutaneous tissue, unspecified: L08.9

## 2013-09-18 HISTORY — DX: Other symptoms and signs involving the genitourinary system: R39.89

## 2013-09-18 LAB — POCT URINALYSIS DIPSTICK
Glucose, UA: NEGATIVE
Protein, UA: NEGATIVE
Spec Grav, UA: 1.015
Urobilinogen, UA: 0.2

## 2013-09-18 LAB — POCT UA - MICROSCOPIC ONLY

## 2013-09-18 MED ORDER — LEVOTHYROXINE SODIUM 125 MCG PO TABS: 125.0000 ug | ORAL_TABLET | Freq: Every day | ORAL | Status: DC

## 2013-09-18 MED ORDER — CLINDAMYCIN HCL 300 MG PO CAPS: 300.0000 mg | ORAL_CAPSULE | Freq: Three times a day (TID) | ORAL | Status: DC

## 2013-09-18 NOTE — Patient Instructions (Signed)

## 2013-09-18 NOTE — Progress Notes (Signed)
Subjective:     Patient ID: Jill [name], female   DOB: 10-13-1951, 62 y.o.   MRN: [id number]  HPI Hypothyroidism: Patient here for follow up and medication refill,she has been out of her synthroid for 6 wks and since then she had gained weight. Denies any other thyroid related concern. Urine discoloration: C/o change in her urine color  For the past 1 wk,recently after wiping her vulva she had seen some blood,her urine stinks ,no dysuria,some frequency.No Gi symptoms. WUJ:[id number] with her BP medications. Knot on the back: C/O knot on her back which she has had for many years,she had it excised in the past but recently has been increasing in size,in the last few days it has become painful to touch,she denies fever.  Current Outpatient Prescriptions on File Prior to Visit  Medication Sig Dispense Refill  . amLODipine (NORVASC) 10 MG tablet Take 1 tablet (10 mg total) by mouth daily.  90 tablet  3  . aspirin EC 81 MG tablet Take 81 mg by mouth daily.      Marland Kitchen atorvastatin (LIPITOR) 40 MG tablet Take 1 tablet (40 mg total) by mouth daily.  90 tablet  3  . hydrochlorothiazide (HYDRODIURIL) 25 MG tablet Take 1 tablet (25 mg total) by mouth daily.  90 tablet  3  . losartan (COZAAR) 100 MG tablet Take 1 tablet (100 mg total) by mouth daily.  30 tablet  2  . metoprolol (LOPRESSOR) 50 MG tablet Take 1 tablet (50 mg total) by mouth daily.  90 tablet  3  . nitroGLYCERIN (NITROSTAT) 0.4 MG SL tablet Place 1 tablet (0.4 mg total) under the tongue every 5 (five) minutes as needed. For chest pain  25 tablet  6  . PARoxetine (PAXIL) 40 MG tablet Take 1 tablet (40 mg total) by mouth every morning.  30 tablet  3  . potassium chloride (K-DUR) 10 MEQ tablet Take 1 tablet (10 mEq total) by mouth daily.  90 tablet  3  . calcium carbonate (TUMS - DOSED IN MG ELEMENTAL CALCIUM) 500 MG chewable tablet Chew 2 tablets by mouth daily as needed. heartburn      . levothyroxine (SYNTHROID, LEVOTHROID) 125 MCG tablet Take 1  tablet (125 mcg total) by mouth daily.  30 tablet  1   No current facility-administered medications on file prior to visit.   Past Medical History  Diagnosis Date  . Coronary artery disease     Prior inferior MI with stent to RCA, s/p CABG in 2008  . Hyperlipidemia   . Hypertension   . Fatigue   . Dizziness   . Hypothyroidism   . Normal nuclear stress test Ju;y 2012    No ischemia. EF 70%; fixed defect involving septum, inferoseptal and inferior wall  . Tobacco abuse   . Retroperitoneal bleeding     Following cardiac cath  . GERD (gastroesophageal reflux disease)   . Headache(784.0)     UNRUPTURED CEREBRAL ANEURYSM  . Myocardial infarction   . Blood transfusion   . Anxiety     ON PAXIL, XANAX  . AVM (arteriovenous malformation) brain     s/p stent/coil      Review of Systems  Constitutional: Positive for unexpected weight change.       Gained weight since stop synthroid  Respiratory: Negative.   Cardiovascular: Negative.   Gastrointestinal: Negative.   Genitourinary: Positive for frequency. Negative for dysuria, urgency, vaginal bleeding, vaginal discharge and pelvic pain.       Urine  discoloration  All other systems reviewed and are negative.   Filed Vitals:   09/18/13 1537  BP: 123/73  Pulse: 67  Temp: 98.4 F (36.9 C)  TempSrc: Oral  Height: 5\' 7"  (1.702 m)  Weight: 164 lb (74.39 kg)       Objective:   Physical Exam  Nursing note and vitals reviewed. Constitutional: She appears well-developed. No distress.  Cardiovascular: Normal rate, regular rhythm, normal heart sounds and intact distal pulses.   No murmur heard. Pulmonary/Chest: Effort normal and breath sounds normal. No respiratory distress. She has no wheezes.  Abdominal: Soft. Bowel sounds are normal. She exhibits no distension. There is no tenderness.  Musculoskeletal: Normal range of motion. She exhibits no edema.  Skin:          Assessment:     Hypothyroidism HTN Urine  discoloration Infected cyst     Plan:     Check problem list

## 2013-09-18 NOTE — Assessment & Plan Note (Signed)
Off medication for 6 wks. No point getting TSH today. I refilled her Synthroid. RTC in 4 wks for reassessment.

## 2013-09-18 NOTE — Assessment & Plan Note (Signed)
BP looks good. Continue current regimen. 

## 2013-09-18 NOTE — Assessment & Plan Note (Signed)
Started on Clindamycin,patient allergic to sulphur and penicillin hence cannot give Keflex or Bactrim. RTC in 1 wk for cyst removal.

## 2013-09-18 NOTE — Assessment & Plan Note (Signed)
R/O UTI. Urine dipstick preliminary report showed RBC and leukocyte. Urine sent to lab for culture. Already on A/B for infected cyst. F/U with culture report.

## 2013-09-20 LAB — URINE CULTURE: Culture: >=100000

## 2013-09-22 ENCOUNTER — Telehealth: Payer: Self-pay | Admitting: Family Medicine

## 2013-09-22 NOTE — Telephone Encounter (Signed)
I called to discuss her urine culture report which is positive for Ecoli, sensitivity reviewed. Patient already on Clindamycin which is not quite under Tobramycin class which patient is sensitive to, but as per patient she stated her urine is clearing and her symptom has improved a lot, hence will not change A/B to either Levaquin or Cipro at this time. As discussed with the patient, plan is to complete her current A/B for which she was started on for her infected cyst. She is coming to see Dr Jennette Kettle in 3 days at the procedure clinic for cyst removal, I suggested she gets a repeat urine culture during that visit. If culture is then positive again, I will treat her again. She agreed with plan and verbalized understanding.

## 2013-09-25 ENCOUNTER — Ambulatory Visit (INDEPENDENT_AMBULATORY_CARE_PROVIDER_SITE_OTHER): Payer: Self-pay | Admitting: Family Medicine

## 2013-09-25 VITALS — BP 137/79 | HR 67 | Temp 97.9°F | Ht 67.0 in | Wt 167.4 lb

## 2013-09-25 DIAGNOSIS — L723 Sebaceous cyst: Secondary | ICD-10-CM

## 2013-09-25 DIAGNOSIS — R300 Dysuria: Secondary | ICD-10-CM

## 2013-09-25 LAB — POCT UA - MICROSCOPIC ONLY

## 2013-09-25 LAB — POCT URINALYSIS DIPSTICK
Glucose, UA: NEGATIVE
Ketones, UA: NEGATIVE
Nitrite, UA: POSITIVE
Protein, UA: NEGATIVE
Spec Grav, UA: 1.025
Urobilinogen, UA: 0.2

## 2013-09-26 ENCOUNTER — Telehealth: Payer: Self-pay | Admitting: Family Medicine

## 2013-09-26 MED ORDER — CIPROFLOXACIN HCL 500 MG PO TABS: 500.0000 mg | ORAL_TABLET | Freq: Every day | ORAL | Status: AC

## 2013-09-26 MED ORDER — LEVOFLOXACIN 750 MG PO TABS: 750.0000 mg | ORAL_TABLET | Freq: Every day | ORAL | Status: DC

## 2013-09-26 NOTE — Telephone Encounter (Signed)
Pt is aware of this. Jill [name],CMA  

## 2013-09-26 NOTE — Telephone Encounter (Signed)
I discussed repeat urine culture report with patient which is again positive with E.coli,hence will treat with Levaquin which she was sensitive to in the previous culture. She is to pick up prescription at the pharmacy.

## 2013-09-26 NOTE — Telephone Encounter (Signed)
Patient called she could not afford levaquin since she does not have insurance, this was switched to Ciprofloxacin which she was also sensitive to. i called pharmacy anc canceled her Levaquin and called in Cipro.

## 2013-09-26 NOTE — Telephone Encounter (Signed)
Please contact patient her urine culture is positive again for Ecoli. I will like her to be started on Levaquin which I e-scribed to her pharmacy.

## 2013-09-28 LAB — URINE CULTURE: Colony Count: >=100000

## 2013-09-30 NOTE — Progress Notes (Signed)
Patient ID: Jill [name], female   DOB: [birth year], 62 y.o.   MRN: [id number] She is here for removal of cyst on her upper right back. Has been there for many years. Has had it drained several times. Is large once again and she would like it permanently removed. SUBJECTIVE: 1 cm cystic mass top of right shoulder blade. No erythema or induration. Back is otherwise normal to inspection. PROCEDURE note: Patient given informed consent, signed copy in the chart. Appropriate time out taken. 2% lidocaine was used for local anesthesia. After anesthesia was obtained, -- the area was prepped and draped in usual sterile fashion, a partial... vision was made along the top border of the cyst. Some cyst material leak out, so we evacuated the entire cyst is much as possible using topical pressure. Typical sebaceous cyst material was extruded. The cyst sac was then removed piecemeal from the opening. A great deal of attention and care was taken to remove all of the existing identifiable cystic sac material. The partial... was then closed with 4 interrupted sutures of 3-0 nylon. A small amount of antibiotic was placed on the incision site and then was covered with a Band-Aid.  Patient was given post procedure instructions including how to care for the area, signs to watch for including infectious sounds,. I recommend she have her sutures out in 5-7 days. She can return here for them or she can have her daughter who is a nurse take them out.

## 2013-10-16 ENCOUNTER — Encounter: Payer: Self-pay | Admitting: Family Medicine

## 2013-10-16 ENCOUNTER — Ambulatory Visit (INDEPENDENT_AMBULATORY_CARE_PROVIDER_SITE_OTHER): Payer: Self-pay | Admitting: Family Medicine

## 2013-10-16 VITALS — BP 103/69 | HR 65 | Temp 98.0°F | Wt 159.0 lb

## 2013-10-16 DIAGNOSIS — E039 Hypothyroidism, unspecified: Secondary | ICD-10-CM

## 2013-10-16 DIAGNOSIS — L089 Local infection of the skin and subcutaneous tissue, unspecified: Secondary | ICD-10-CM

## 2013-10-16 DIAGNOSIS — F419 Anxiety disorder, unspecified: Secondary | ICD-10-CM

## 2013-10-16 DIAGNOSIS — N390 Urinary tract infection, site not specified: Secondary | ICD-10-CM

## 2013-10-16 DIAGNOSIS — F411 Generalized anxiety disorder: Secondary | ICD-10-CM

## 2013-10-16 DIAGNOSIS — L723 Sebaceous cyst: Secondary | ICD-10-CM

## 2013-10-16 DIAGNOSIS — I10 Essential (primary) hypertension: Secondary | ICD-10-CM

## 2013-10-16 HISTORY — DX: Urinary tract infection, site not specified: N39.0

## 2013-10-16 LAB — POCT URINALYSIS DIPSTICK
Bilirubin, UA: NEGATIVE
Glucose, UA: NEGATIVE
Nitrite, UA: NEGATIVE
Spec Grav, UA: 1.02

## 2013-10-16 LAB — POCT UA - MICROSCOPIC ONLY

## 2013-10-16 LAB — TSH: TSH: 40.657 u[IU]/mL — ABNORMAL HIGH (ref 0.350–4.500)

## 2013-10-16 MED ORDER — LEVOTHYROXINE SODIUM 125 MCG PO TABS: 125.0000 ug | ORAL_TABLET | Freq: Every day | ORAL | Status: DC

## 2013-10-16 MED ORDER — PAROXETINE HCL 40 MG PO TABS: 40.0000 mg | ORAL_TABLET | ORAL | Status: DC

## 2013-10-16 NOTE — Assessment & Plan Note (Signed)
Stable on Paxil. Medication refilled today.

## 2013-10-16 NOTE — Patient Instructions (Addendum)
It was nice to see you today Ms. Dowling.  We have checked your thyroid hormone again today and have refilled your medicines.  We will see you back in 3 months!

## 2013-10-16 NOTE — Assessment & Plan Note (Signed)
S/P antibiotic and cyst removal. Surgery site healed well. Stitch  removed today.

## 2013-10-16 NOTE — Assessment & Plan Note (Signed)
I refilled her synthroid. TSH rechecked today.

## 2013-10-16 NOTE — Assessment & Plan Note (Signed)
Symptoms resolved s/p antibiotic therapy. Urine dipstick today negative for Nitrite and leukocyte. No further treatment needed. Hydration encouraged.

## 2013-10-16 NOTE — Progress Notes (Signed)
Patient ID: Jill [name], female   DOB: 1951/04/02, 62 y.o.   MRN: [id number]  Hypothyroidism:  Patient is now taking her synthroid.  She reports no adverse effects and is pleased that she has already lost some weight.Need refill.  UTI: Patient says her symptoms have fully resolved.  She no longer reports any back pain or foul smelling urine. Cyst: Here for follow up after removal. She has one stitch in place that need removal. Anxiety: Doing well on Paxil. Need refill. HTN: COmpliant with medication.Here for follow up.  The following portions of the patient's history were reviewed and updated as appropriate: allergies, current medications, past medical history, family and social history, and problem list.  Patient is a current smoker.  Past Medical History  Diagnosis Date  . Coronary artery disease     Prior inferior MI with stent to RCA, s/p CABG in 2008  . Hyperlipidemia   . Hypertension   . Fatigue   . Dizziness   . Hypothyroidism   . Normal nuclear stress test Ju;y 2012    No ischemia. EF 70%; fixed defect involving septum, inferoseptal and inferior wall  . Tobacco abuse   . Retroperitoneal bleeding     Following cardiac cath  . GERD (gastroesophageal reflux disease)   . Headache(784.0)     UNRUPTURED CEREBRAL ANEURYSM  . Myocardial infarction   . Blood transfusion   . Anxiety     ON PAXIL, XANAX  . AVM (arteriovenous malformation) brain     s/p stent/coil    ROS as above otherwise neg.    Medications reviewed. Current Outpatient Prescriptions  Medication Sig Dispense Refill  . amLODipine (NORVASC) 10 MG tablet Take 1 tablet (10 mg total) by mouth daily.  90 tablet  3  . aspirin EC 81 MG tablet Take 81 mg by mouth daily.      Marland Kitchen atorvastatin (LIPITOR) 40 MG tablet Take 1 tablet (40 mg total) by mouth daily.  90 tablet  3  . calcium carbonate (TUMS - DOSED IN MG ELEMENTAL CALCIUM) 500 MG chewable tablet Chew 2 tablets by mouth daily as needed. heartburn      .  hydrochlorothiazide (HYDRODIURIL) 25 MG tablet Take 1 tablet (25 mg total) by mouth daily.  90 tablet  3  . levothyroxine (SYNTHROID, LEVOTHROID) 125 MCG tablet Take 1 tablet (125 mcg total) by mouth daily.  30 tablet  2  . losartan (COZAAR) 100 MG tablet Take 1 tablet (100 mg total) by mouth daily.  30 tablet  2  . metoprolol (LOPRESSOR) 50 MG tablet Take 1 tablet (50 mg total) by mouth daily.  90 tablet  3  . nitroGLYCERIN (NITROSTAT) 0.4 MG SL tablet Place 1 tablet (0.4 mg total) under the tongue every 5 (five) minutes as needed. For chest pain  25 tablet  6  . PARoxetine (PAXIL) 40 MG tablet Take 1 tablet (40 mg total) by mouth every morning.  30 tablet  3  . potassium chloride (K-DUR) 10 MEQ tablet Take 1 tablet (10 mEq total) by mouth daily.  90 tablet  3   No current facility-administered medications for this visit.    Exam:  BP 103/69  Pulse 65  Temp(Src) 98 F (36.7 C) (Oral)  Wt 159 lb (72.122 kg) Gen: Well NAD HEENT: EOMI,  MMM Lungs: CTABL Nl WOB Heart: RRR no MRG Exts: Non edematous BL  LE, warm and well perfused.  Skin: Incision from cyst removal healed well with single suture  in place. Psych: Normal exam. Not suicidal.  No results found for this or any previous visit (from the past 72 hour(s)).  Assessment Jill [name] is a 62 y.o. Female here for follow-up of her hypothyroidism.  She is currently taking her medication and having no side effects.  Plan  Hypothyroidism: -patient is well managed back on her Synthroid -recheck TSH today -continue Synthroid 125 mcg  UTI: -now resolved -will obtain urine dipstick to confirm   Sebaceous Cyst: -removed stitches from incision -wound is well healed  FMTS Attending Note: Kehinde Eniola,MD I  have seen and examined this patient, reviewed their chart. I have discussed this patient with the medical student. I agree with the student's findings, assessment and care plan.My addendum is in color. Check problem list for my  A/P.

## 2013-10-16 NOTE — Assessment & Plan Note (Signed)
Compliant wit treatment regimen. BP looks good. Continue Norvasc 10, HCTZ 25mg ,Motroprolol 50 mg BID and Losartan 100mg . Continue home BP check.

## 2013-10-17 ENCOUNTER — Other Ambulatory Visit: Payer: Self-pay | Admitting: Family Medicine

## 2013-10-17 ENCOUNTER — Telehealth: Payer: Self-pay | Admitting: Family Medicine

## 2013-10-17 MED ORDER — LEVOTHYROXINE SODIUM 150 MCG PO TABS: 150.0000 ug | ORAL_TABLET | Freq: Every day | ORAL | Status: DC

## 2013-10-17 NOTE — Telephone Encounter (Addendum)
TSH worsened from 16 to 40. I called and discussed result with patient. She claimed compliance with Synthroid 125 mcg daily. I suggested we increase her dose to 16mcg,she agreed with plan. Prescription called in to her pharmacy. She is concern about weight loss despite high TSH. As discussed with her there are other causes of weight loss including poor diet or cancer. Per record she has not had colonoscopy,or mammogram or PAP.Patient does not have insurance. I advised her to follow up with me next week for PAP and instruction on how to get mammogram done. During that visit I will also give her stool card for hemoccult testing since she cannot get colonoscopy now. She agreed with plan and would schedule appointment ASAP.  I will forward this to blue team staff to schedule her appointment with me.  Jazmin/ Shanda Bumps, please schedule CPE appointment for Ms Jill [name] for next week or earliest appointment with me. Thanks.

## 2013-11-05 ENCOUNTER — Other Ambulatory Visit: Payer: Self-pay | Admitting: *Deleted

## 2013-11-05 DIAGNOSIS — I2510 Atherosclerotic heart disease of native coronary artery without angina pectoris: Secondary | ICD-10-CM

## 2013-11-05 DIAGNOSIS — E785 Hyperlipidemia, unspecified: Secondary | ICD-10-CM

## 2013-11-05 DIAGNOSIS — Z720 Tobacco use: Secondary | ICD-10-CM

## 2013-11-05 MED ORDER — AMLODIPINE BESYLATE 10 MG PO TABS: 10.0000 mg | ORAL_TABLET | Freq: Every day | ORAL | Status: DC

## 2013-11-05 MED ORDER — LOSARTAN POTASSIUM 100 MG PO TABS: 100.0000 mg | ORAL_TABLET | Freq: Every day | ORAL | Status: DC

## 2013-11-20 ENCOUNTER — Encounter: Payer: Self-pay | Admitting: Family Medicine

## 2013-11-20 ENCOUNTER — Ambulatory Visit (INDEPENDENT_AMBULATORY_CARE_PROVIDER_SITE_OTHER): Payer: Self-pay | Admitting: Family Medicine

## 2013-11-20 VITALS — BP 140/79 | HR 100 | Temp 97.9°F | Ht 67.0 in | Wt 163.4 lb

## 2013-11-20 DIAGNOSIS — L309 Dermatitis, unspecified: Secondary | ICD-10-CM

## 2013-11-20 DIAGNOSIS — L259 Unspecified contact dermatitis, unspecified cause: Secondary | ICD-10-CM

## 2013-11-20 DIAGNOSIS — Z0000 Encounter for general adult medical examination without abnormal findings: Secondary | ICD-10-CM

## 2013-11-20 MED ORDER — BETAMETHASONE VALERATE 0.1 % EX CREA: TOPICAL_CREAM | Freq: Two times a day (BID) | CUTANEOUS | Status: DC

## 2013-11-20 NOTE — Assessment & Plan Note (Signed)
Hyperpigmented macular rash on her back. Etiology unclear, contact dermatitis,atopic unlikely. Trial of topical steroid recommended. Patient to return soon or within 4 wks if rash does not improve or worsens. Plan to biopsy at that time to r/o melanoma,but rash is atypical for melanoma. Patient verbalized understanding.

## 2013-11-20 NOTE — Progress Notes (Signed)
Patient ID: Jill [name], female   DOB: 04/23/1951, 63 y.o.   MRN: [id number] Subjective:     Jill [name] is a 63 y.o. female and is here for a comprehensive physical exam. The patient reports problems - bruising on her back and mild pain on her rib with breathing,denies trauma,she is just recovering from URI. F/U for breast cancer screening,mom had breast cancer,passed away in 01-24-1988. Maternal aunt with ovarian cancer.  History   Social History  . Marital Status: Married    Spouse Name: N/A    Number of Children: N/A  . Years of Education: N/A   Occupational History  . Not on file.   Social History Main Topics  . Smoking status: Current Every Day Smoker -- 1.00 packs/day for 53 years    Types: Cigarettes  . Smokeless tobacco: Not on file  . Alcohol Use: No  . Drug Use: No  . Sexual Activity: Yes    Birth Control/ Protection: Post-menopausal   Other Topics Concern  . Not on file   Social History Writer.  Some college.  Currently retired.  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 worked in [employer] most recently.      Lives with husband and 2 adult daughters and 4 grandchildren.   Health Maintenance  Topic Date Due  . Pap Smear  03/09/1967  . Mammogram  03/09/1999  . Colonoscopy  03/09/1999  . Zostavax  03/08/2009  . Influenza Vaccine  06/06/2013  . Tetanus/tdap  06/05/2022    The following portions of the patient's history were reviewed and updated as appropriate: allergies, current medications, past family history, past medical history, past social history, past surgical history and problem list.  Review of Systems Pertinent items are noted in HPI.  Review of Systems  Constitutional: Negative for fever.  Eyes: Negative.   Respiratory: Negative.   Cardiovascular: Negative.   Gastrointestinal: Negative for nausea, vomiting, diarrhea, constipation, blood in stool and melena.       Hemorrhoid  Genitourinary: Negative.     Musculoskeletal:       Side/rib pain on the left.  Skin:       Bruising on the back.  Neurological: Negative.   Psychiatric/Behavioral: Negative.   All other systems reviewed and are negative.    Objective:   Physical Exam  Nursing note and vitals reviewed. Constitutional: She is oriented to person, place, and time. She appears well-developed. No distress.  Eyes: EOM are normal.  Neck: Neck supple.  Cardiovascular: Normal rate, regular rhythm, normal heart sounds and intact distal pulses.  Exam reveals no gallop and no friction rub.   No murmur heard. Pulmonary/Chest: Effort normal and breath sounds normal. Not tachypneic. No respiratory distress. She has no decreased breath sounds. She has no wheezes. She has no rhonchi. She has no rales.   She exhibits no bony tenderness. Right breast exhibits no inverted nipple, no mass, no nipple discharge, no skin change and no tenderness. Left breast exhibits no inverted nipple, no mass, no nipple discharge, no skin change and no tenderness. Breasts are symmetrical. There is no breast swelling.  Abdominal: Soft. Bowel sounds are normal. She exhibits no distension and no mass. There is no tenderness.  Genitourinary:  Declined, PAP offered as well but she declined due to lack of  insurance.  Neurological: She is alert and oriented to person, place, and time. She has normal reflexes. She displays normal reflexes. No cranial nerve deficit.  Assessment:    Healthy female exam.      Plan:     See After Visit Summary for Counseling Recommendations

## 2013-11-20 NOTE — Patient Instructions (Signed)
Pap Test A Pap test checks the cells on the surface of your cervix. Your doctor will look for cell changes that are not normal, an infection, or cancer. If the cells no longer look normal, it is called dysplasia. Dysplasia can turn into cancer. Regular Pap tests are important to stop cancer from developing. BEFORE THE PROCEDURE  Ask your doctor when to schedule your Pap test. Timing the test around your period may be important.  Do not douche or have sex (intercourse) for 24 hours before the test.  Do not put creams on your vagina or use tampons for 24 hours before the test.  Go pee (urinate) just before the test. PROCEDURE  You will lie on an exam table with your feet in stirrups.  A warm metal or plastic tool (speculum) will be put in your vagina to open it up.  Your doctor will use a small, plastic brush or wooden spatula to take cells from your cervix.  The cells will be put in a lab container.  The cells will be checked under a microscope to see if they are normal or not. AFTER THE PROCEDURE Get your test results. If they are abnormal, you may need more tests. Document Released: 11/25/2010 Document Revised: 01/15/2012 Document Reviewed: 10/19/2011 [facility] Patient Information 2014 [place], [place].

## 2013-11-20 NOTE — Assessment & Plan Note (Signed)
Complete exam significant for rash on the back otherwise normal. No rib tenderness on exam. Breast exam normal and she was also given a scholarship card to obtain mammogram at [facility] since she does not have insurance. She declined PAP test today due to lack of insurance,she will call in to meet with Pamala Hurry to obtain orange card. Plan to reschedule PAP test after obtaining orange card. 3 stool card given for hemoccult testing since she cannot get colonoscopy done due to lack of insurance. F/U in 1-3 months for health maintenance update.

## 2014-04-23 ENCOUNTER — Other Ambulatory Visit: Payer: Self-pay | Admitting: Family Medicine

## 2014-08-21 ENCOUNTER — Other Ambulatory Visit: Payer: Self-pay | Admitting: Cardiovascular Disease

## 2015-04-06 ENCOUNTER — Encounter: Payer: Self-pay | Admitting: *Deleted

## 2015-04-30 ENCOUNTER — Encounter: Payer: Self-pay | Admitting: *Deleted

## 2015-05-03 ENCOUNTER — Ambulatory Visit: Payer: Self-pay | Admitting: Cardiovascular Disease

## 2015-05-03 ENCOUNTER — Encounter: Payer: Self-pay | Admitting: *Deleted

## 2015-05-19 ENCOUNTER — Encounter: Payer: Self-pay | Admitting: Cardiovascular Disease

## 2015-08-16 ENCOUNTER — Encounter (HOSPITAL_COMMUNITY): Payer: Self-pay | Admitting: *Deleted

## 2015-08-16 ENCOUNTER — Emergency Department (HOSPITAL_COMMUNITY)
Admission: EM | Admit: 2015-08-16 | Discharge: 2015-08-16 | Disposition: A | Discharge status: Home / Self Care | Payer: Medicare Other | Attending: Emergency Medicine | Admitting: Emergency Medicine

## 2015-08-16 ENCOUNTER — Emergency Department (HOSPITAL_COMMUNITY): Payer: Medicare Other

## 2015-08-16 DIAGNOSIS — K219 Gastro-esophageal reflux disease without esophagitis: Secondary | ICD-10-CM | POA: Insufficient documentation

## 2015-08-16 DIAGNOSIS — R55 Syncope and collapse: Secondary | ICD-10-CM | POA: Insufficient documentation

## 2015-08-16 DIAGNOSIS — Z79899 Other long term (current) drug therapy: Secondary | ICD-10-CM | POA: Diagnosis not present

## 2015-08-16 DIAGNOSIS — Z880 Allergy status to penicillin: Secondary | ICD-10-CM | POA: Diagnosis not present

## 2015-08-16 DIAGNOSIS — F419 Anxiety disorder, unspecified: Secondary | ICD-10-CM | POA: Insufficient documentation

## 2015-08-16 DIAGNOSIS — I10 Essential (primary) hypertension: Secondary | ICD-10-CM | POA: Insufficient documentation

## 2015-08-16 DIAGNOSIS — I2510 Atherosclerotic heart disease of native coronary artery without angina pectoris: Secondary | ICD-10-CM | POA: Insufficient documentation

## 2015-08-16 DIAGNOSIS — E785 Hyperlipidemia, unspecified: Secondary | ICD-10-CM | POA: Insufficient documentation

## 2015-08-16 DIAGNOSIS — I252 Old myocardial infarction: Secondary | ICD-10-CM | POA: Diagnosis not present

## 2015-08-16 DIAGNOSIS — Z7982 Long term (current) use of aspirin: Secondary | ICD-10-CM | POA: Insufficient documentation

## 2015-08-16 DIAGNOSIS — E039 Hypothyroidism, unspecified: Secondary | ICD-10-CM | POA: Insufficient documentation

## 2015-08-16 LAB — BASIC METABOLIC PANEL
ANION GAP: 6 (ref 5–15)
BUN: 9 mg/dL (ref 6–20)
CALCIUM: 9 mg/dL (ref 8.9–10.3)
CO2: 27 mmol/L (ref 22–32)
CREATININE: 0.98 mg/dL (ref 0.44–1.00)
Chloride: 106 mmol/L (ref 101–111)
GFR, EST NON AFRICAN AMERICAN: 59 mL/min — AB (ref >60)
Glucose, Bld: 123 mg/dL — ABNORMAL HIGH (ref 65–99)
Potassium: 4 mmol/L (ref 3.5–5.1)
SODIUM: 139 mmol/L (ref 135–145)

## 2015-08-16 LAB — CBC
HCT: 45.8 % (ref 36.0–46.0)
HEMOGLOBIN: 15.3 g/dL — AB (ref 12.0–15.0)
MCH: 31.9 pg (ref 26.0–34.0)
MCHC: 33.4 g/dL (ref 30.0–36.0)
MCV: 95.6 fL (ref 78.0–100.0)
PLATELETS: 260 10*3/uL (ref 150–400)
RBC: 4.79 MIL/uL (ref 3.87–5.11)
RDW: 12.6 % (ref 11.5–15.5)
WBC: 8.7 10*3/uL (ref 4.0–10.5)

## 2015-08-16 LAB — I-STAT TROPONIN, ED: TROPONIN I, POC: 0 ng/mL (ref 0.00–0.08)

## 2015-08-16 MED ORDER — SODIUM CHLORIDE 0.9 % IV BOLUS (SEPSIS)
1000.0000 mL | Freq: Once | INTRAVENOUS | Status: AC
  Administered 2015-08-16: 1000 mL via INTRAVENOUS

## 2015-08-16 NOTE — ED Notes (Signed)
Pt back in room.

## 2015-08-16 NOTE — ED Notes (Signed)
Pt was out working in the yard and when she got up and walked across the yard she got dizzy and weak and felt like she was going to pass out, she was lowered to the ground but did not fully pass out.  No CP and no sob with this.  Pt had a similar episode in the pass where she did pass out and an aneurism (av malformation) was found.  No focal weakness or other neurological symptoms noted.

## 2015-08-16 NOTE — ED Notes (Addendum)
CBG for ems 175

## 2015-08-16 NOTE — ED Provider Notes (Signed)
CSN: [id number]     Arrival date & time 08/16/15  1346 History   First MD Initiated Contact with Patient 08/16/15 1420     Chief Complaint  Patient presents with  . Near Syncope     (Consider location/radiation/quality/duration/timing/severity/associated sxs/prior Treatment) Patient is a 64 y.o. female presenting with near-syncope. The history is provided by the patient. No language interpreter was used.  Near Syncope Associated symptoms include nausea. Pertinent negatives include no abdominal pain, chest pain or fever.  Jill [name] is a 63 y.o with a history CAD with CABG x 5 (anticoagulated on 81 mg of aspirin), hyperlipidemia, HTN, hypothyroidism, AV malformation (berry malformation) who presents for dizziness, weakness, and near syncopal episode while working in the yard a few hours ago. She states she got up to walk across the yard and felt like she was going to pass out. She laid down near a pole and asked someone to get her husband. She denies any loss of consciousness or injury. She denies any chest pain, shortness of breath, or diaphoresis with this. She states she felt this way when she had an AV malformation in 2013. She states she has not followed up since and her last angina was in 2013. She denies any recent illness, fever, chills, cough, abdominal pain, vomiting, diarrhea, or urinary symptoms. She smokes 1-2 packs of cigarettes per day.   Past Medical History  Diagnosis Date  . Coronary artery disease     Prior inferior MI with stent to RCA, s/p CABG in 2008  . Hyperlipidemia   . Hypertension   . Fatigue   . Dizziness   . Hypothyroidism   . Normal nuclear stress test Ju;y 2012    No ischemia. EF 70%; fixed defect involving septum, inferoseptal and inferior wall  . Tobacco abuse   . Retroperitoneal bleeding     Following cardiac cath  . GERD (gastroesophageal reflux disease)   . Headache(784.0)     UNRUPTURED CEREBRAL ANEURYSM  . Myocardial infarction ([place])   . Blood  transfusion   . Anxiety     ON PAXIL, XANAX  . AVM (arteriovenous malformation) brain     s/p stent/coil   Past Surgical History  Procedure Laterality Date  . Cardiac catheterization  01/02/2007    IT REVEALS MILD INFERIOR WALL HYPOKINESIS. THE EJECTION FRACTION IS AROUND 50%  . Coronary artery bypass graft  2008    LIMA to LAD, SVG to DX, SVG to LCX & SVG to OM 1 & 2, and SVG to PD  . Coronary stent placement      Remote past stent to RCA  . Hemorrhoid surgery  1989  . Ventriculostomy  10/06/2011    Procedure: VENTRICULOSTOMY;  Surgeon: Winfield Cunas;  Location: [facility];  Service: Neurosurgery;  Laterality: Right;  Insertion of Ventriculostomy Catheter   Family History  Problem Relation Age of Onset  . Heart disease Neg Hx   . Cancer Mother 92  . Diabetes Mother   . Alcohol abuse Father   . Asthma Father   . Diabetes Brother   . Lung cancer Brother   . Ovarian cancer Maternal Aunt   . Liver cancer Paternal Aunt    Social History  Substance Use Topics  . Smoking status: Current Every Day Smoker -- 1.00 packs/day for 53 years    Types: Cigarettes  . Smokeless tobacco: None  . Alcohol Use: No   OB History    No data available     Review  of Systems  Constitutional: Negative for fever.  Respiratory: Negative for shortness of breath.   Cardiovascular: Positive for near-syncope. Negative for chest pain.  Gastrointestinal: Positive for nausea. Negative for abdominal pain.  Neurological: Positive for dizziness and light-headedness. Negative for syncope.  All other systems reviewed and are negative.     Allergies  Ace inhibitors; Penicillins; and Sulfa drugs cross reactors  Home Medications   Prior to Admission medications   Medication Sig Start Date End Date Taking? Authorizing Provider  amLODipine (NORVASC) 10 MG tablet Take 1 tablet (10 mg total) by mouth daily. 11/05/13  Yes Thayer Headings, MD  aspirin EC 81 MG tablet Take 81 mg by mouth daily.   Yes  Historical Provider, MD  calcium carbonate (TUMS - DOSED IN MG ELEMENTAL CALCIUM) 500 MG chewable tablet Chew 2 tablets by mouth daily as needed. heartburn   Yes Historical Provider, MD  hydrochlorothiazide (HYDRODIURIL) 25 MG tablet Take 1 tablet (25 mg total) by mouth daily. 05/22/13  Yes Thayer Headings, MD  levothyroxine (SYNTHROID, LEVOTHROID) 150 MCG tablet TAKE 1 TABLET BY MOUTH EVERY DAY   Yes [name], MD  losartan (COZAAR) 100 MG tablet Take 1 tablet (100 mg total) by mouth daily. 11/05/13  Yes Thayer Headings, MD  metoprolol (LOPRESSOR) 50 MG tablet TAKE 1 TABLET BY MOUTH EVERY DAY 08/21/14  Yes Thayer Headings, MD  PARoxetine (PAXIL) 40 MG tablet TAKE 1 TABLET BY MOUTH EVERY DAY IN THE MORNING   Yes [name], MD  atorvastatin (LIPITOR) 40 MG tablet Take 1 tablet (40 mg total) by mouth daily. 07/11/13 07/11/14  Thayer Headings, MD  betamethasone valerate (VALISONE) 0.1 % cream Apply topically 2 (two) times daily. 11/20/13   [name], MD  nitroGLYCERIN (NITROSTAT) 0.4 MG SL tablet Place 1 tablet (0.4 mg total) under the tongue every 5 (five) minutes as needed. For chest pain 05/22/13   Thayer Headings, MD  potassium chloride (K-DUR) 10 MEQ tablet Take 1 tablet (10 mEq total) by mouth daily. 05/22/13   Thayer Headings, MD   BP 110/62 mmHg  Pulse 55  Temp(Src) 97.8 F (36.6 C) (Oral)  Resp 21  Ht '5\' 7"'$  (1.702 m)  Wt 155 lb (70.308 kg)  BMI 24.27 kg/m2  SpO2 93% Physical Exam  Constitutional: She is oriented to person, place, and time. She appears well-developed and well-nourished.  HENT:  Head: Normocephalic and atraumatic.  Eyes: Conjunctivae are normal.  Neck: Normal range of motion. Neck supple.  Cardiovascular: Normal rate, regular rhythm and normal heart sounds.   Pulmonary/Chest: Effort normal and breath sounds normal. No respiratory distress. She has no decreased breath sounds. She has no wheezes.  No wheezes or decreased breath sounds.  Abdominal: Soft.  She exhibits no distension. There is no tenderness. There is no rebound and no guarding.  Abdomen is soft and nontender. No guarding or rebound.  Musculoskeletal: Normal range of motion.  Neurological: She is alert and oriented to person, place, and time. She has normal strength. No sensory deficit. She displays a negative Romberg sign. Coordination normal. GCS eye subscore is 4. GCS verbal subscore is 5. GCS motor subscore is 6.  Cranial nerves III through XII intact. GCS of 15. Normal finger to nose bilaterally.  Skin: Skin is warm and dry.  Skin tenting.  Psychiatric: She has a normal mood and affect. Her behavior is normal.  Nursing note and vitals reviewed.   ED Course  Procedures (  including critical care time) Labs Review Labs Reviewed  BASIC METABOLIC PANEL - Abnormal; Notable for the following:    Glucose, Bld 123 (*)    GFR calc non Af Amer 59 (*)    All other components within normal limits  CBC - Abnormal; Notable for the following:    Hemoglobin 15.3 (*)    All other components within normal limits  CBG MONITORING, ED  I-STAT TROPOININ, ED    Imaging Review Ct Head Wo Contrast  08/16/2015   CLINICAL DATA:  Near syncope while working in [employer].  Dizziness.  EXAM: CT HEAD WITHOUT CONTRAST  TECHNIQUE: Contiguous axial images were obtained from the base of the skull through the vertex without intravenous contrast.  COMPARISON:  11/15/2011  FINDINGS: Again noted is a stent within the basilar artery with a coil pack at the distal tip of the basilar artery. This is unchanged. Scattered low-density areas throughout the deep white matter, likely chronic microvascular changes. No acute intracranial abnormality. Specifically, no hemorrhage, hydrocephalus, mass lesion, acute infarction, or significant intracranial injury. No acute calvarial abnormality.  Visualized paranasal sinuses and mastoids clear. Orbital soft tissues unremarkable.  IMPRESSION: No acute intracranial abnormality.    Electronically Signed   By: Rolm Baptise M.D.   On: 08/16/2015 16:08   I have personally reviewed and evaluated these images and lab results as part of my medical decision-making.   EKG Interpretation   Date/Time:  Monday August 16 2015 13:54:22 EDT Ventricular Rate:  60 PR Interval:  191 QRS Duration: 156 QT Interval:  511 QTC Calculation: 511 R Axis:   103 Text Interpretation:  Sinus rhythm IVCD, consider atypical LBBB no  significant change since 2012 Confirmed by [place]  MD, SCOTT ([id number]) on  08/16/2015 1:58:54 PM      MDM   Final diagnoses:  Near syncope  Patient presents for near syncopal episode while planting some flowers in the yard earlier today. She is well-appearing and back to baseline now. Her vitals are stable. She has no complaints of chest pain or shortness of breath. I do not believe this is cardiac related. Her EKG is similar to her previous EKG in 2012. Her labs are unremarkable and troponin is negative. A CT head was obtained since she stated this was similar to her previous episodes with an aneurysm. Head CT is negative for any acute intracranial abnormality. She is not orthostatic and has no pain.  I discussed this patient with Dr. Vanita Panda who has seen and evaluated the patient. He agrees that the patient can be discharged home with close follow-up. I discussed this with the patient. I also discussed return precautions and she verbally agrees with the plan.  Filed Vitals:   08/16/15 1800  BP: 110/62  Pulse: 55  Temp:   Resp: 21   Medications  sodium chloride 0.9 % bolus 1,000 mL (0 mLs Intravenous Stopped 08/16/15 1751)     Jill Glazier, PA-C 08/17/15 0012  Carmin Muskrat, MD 08/18/15 0021

## 2015-08-16 NOTE — ED Notes (Signed)
Pt is in stable condition upon d/c and ambulates from ED. 

## 2015-08-16 NOTE — Discharge Instructions (Signed)
Near-Syncope Follow-up with her primary care provider. Return for any shortness of breath, confusion, or chest pain. Near-syncope (commonly known as near fainting) is sudden weakness, dizziness, or feeling like you might pass out. During an episode of near-syncope, you may also develop pale skin, have tunnel vision, or feel sick to your stomach (nauseous). Near-syncope may occur when getting up after sitting or while standing for a long time. It is caused by a sudden decrease in blood flow to the brain. This decrease can result from various causes or triggers, most of which are not serious. However, because near-syncope can sometimes be a sign of something serious, a medical evaluation is required. The specific cause is often not determined. HOME CARE INSTRUCTIONS  Monitor your condition for any changes. The following actions may help to alleviate any discomfort you are experiencing:  Have someone stay with you until you feel stable.  Lie down right away and prop your feet up if you start feeling like you might faint. Breathe deeply and steadily. Wait until all the symptoms have passed. Most of these episodes last only a few minutes. You may feel tired for several hours.   Drink enough fluids to keep your urine clear or pale yellow.   If you are taking blood pressure or heart medicine, get up slowly when seated or lying down. Take several minutes to sit and then stand. This can reduce dizziness.  Follow up with your health care provider as directed. SEEK IMMEDIATE MEDICAL CARE IF:   You have a severe headache.   You have unusual pain in the chest, abdomen, or back.   You are bleeding from the mouth or rectum, or you have black or tarry stool.   You have an irregular or very fast heartbeat.   You have repeated fainting or have seizure-like jerking during an episode.   You faint when sitting or lying down.   You have confusion.   You have difficulty walking.   You have  severe weakness.   You have vision problems.  MAKE SURE YOU:   Understand these instructions.  Will watch your condition.  Will get help right away if you are not doing well or get worse.   This information is not intended to replace advice given to you by your health care provider. Make sure you discuss any questions you have with your health care provider.   Document Released: 10/23/2005 Document Revised: 10/28/2013 Document Reviewed: 03/28/2013 Elsevier Interactive Patient Education [employer].

## 2016-08-25 ENCOUNTER — Encounter: Payer: Self-pay | Admitting: Family Medicine

## 2016-08-25 ENCOUNTER — Ambulatory Visit: Payer: Medicare Other | Admitting: Family Medicine

## 2016-08-29 ENCOUNTER — Telehealth: Payer: Self-pay

## 2016-08-29 ENCOUNTER — Encounter: Payer: Self-pay | Admitting: Family Medicine

## 2016-08-29 ENCOUNTER — Other Ambulatory Visit (HOSPITAL_COMMUNITY)
Admission: RE | Admit: 2016-08-29 | Discharge: 2016-08-29 | Disposition: A | Discharge status: Home / Self Care | Payer: Medicare Other | Source: Ambulatory Visit | Attending: Family Medicine | Admitting: Family Medicine

## 2016-08-29 ENCOUNTER — Ambulatory Visit (INDEPENDENT_AMBULATORY_CARE_PROVIDER_SITE_OTHER): Payer: Medicare Other | Admitting: Family Medicine

## 2016-08-29 VITALS — BP 124/72 | HR 66 | Temp 98.1°F | Wt 156.0 lb

## 2016-08-29 DIAGNOSIS — E2839 Other primary ovarian failure: Secondary | ICD-10-CM

## 2016-08-29 DIAGNOSIS — Z01419 Encounter for gynecological examination (general) (routine) without abnormal findings: Secondary | ICD-10-CM | POA: Diagnosis present

## 2016-08-29 DIAGNOSIS — Z23 Encounter for immunization: Secondary | ICD-10-CM

## 2016-08-29 DIAGNOSIS — Z124 Encounter for screening for malignant neoplasm of cervix: Secondary | ICD-10-CM

## 2016-08-29 DIAGNOSIS — Z114 Encounter for screening for human immunodeficiency virus [HIV]: Secondary | ICD-10-CM

## 2016-08-29 DIAGNOSIS — E785 Hyperlipidemia, unspecified: Secondary | ICD-10-CM

## 2016-08-29 DIAGNOSIS — Z0000 Encounter for general adult medical examination without abnormal findings: Secondary | ICD-10-CM | POA: Diagnosis not present

## 2016-08-29 DIAGNOSIS — Z1159 Encounter for screening for other viral diseases: Secondary | ICD-10-CM

## 2016-08-29 DIAGNOSIS — E039 Hypothyroidism, unspecified: Secondary | ICD-10-CM

## 2016-08-29 DIAGNOSIS — Z720 Tobacco use: Secondary | ICD-10-CM

## 2016-08-29 DIAGNOSIS — Z1151 Encounter for screening for human papillomavirus (HPV): Secondary | ICD-10-CM | POA: Diagnosis present

## 2016-08-29 DIAGNOSIS — I2510 Atherosclerotic heart disease of native coronary artery without angina pectoris: Secondary | ICD-10-CM

## 2016-08-29 DIAGNOSIS — R9431 Abnormal electrocardiogram [ECG] [EKG]: Secondary | ICD-10-CM

## 2016-08-29 DIAGNOSIS — R55 Syncope and collapse: Secondary | ICD-10-CM | POA: Diagnosis not present

## 2016-08-29 DIAGNOSIS — Z1211 Encounter for screening for malignant neoplasm of colon: Secondary | ICD-10-CM

## 2016-08-29 LAB — COMPLETE METABOLIC PANEL WITH GFR
ALBUMIN: 4 g/dL (ref 3.6–5.1)
ALK PHOS: 70 U/L (ref 33–130)
ALT: 10 U/L (ref 6–29)
AST: 13 U/L (ref 10–35)
BUN: 9 mg/dL (ref 7–25)
CALCIUM: 9.2 mg/dL (ref 8.6–10.4)
CO2: 26 mmol/L (ref 20–31)
CREATININE: 0.99 mg/dL (ref 0.50–0.99)
Chloride: 107 mmol/L (ref 98–110)
GFR, Est African American: 68 mL/min (ref >=60)
GFR, Est Non African American: 59 mL/min — ABNORMAL LOW (ref >=60)
GLUCOSE: 100 mg/dL — AB (ref 65–99)
Potassium: 4.6 mmol/L (ref 3.5–5.3)
SODIUM: 141 mmol/L (ref 135–146)
TOTAL PROTEIN: 6.4 g/dL (ref 6.1–8.1)
Total Bilirubin: 0.3 mg/dL (ref 0.2–1.2)

## 2016-08-29 LAB — LIPID PANEL
CHOLESTEROL: 162 mg/dL (ref 125–200)
HDL: 29 mg/dL — ABNORMAL LOW (ref >=46)
LDL Cholesterol: 93 mg/dL (ref <130)
Total CHOL/HDL Ratio: 5.6 Ratio — ABNORMAL HIGH (ref <=5.0)
Triglycerides: 201 mg/dL — ABNORMAL HIGH (ref <150)
VLDL: 40 mg/dL — ABNORMAL HIGH (ref <30)

## 2016-08-29 LAB — TSH: TSH: 4.6 m[IU]/L — AB

## 2016-08-29 MED ORDER — LOSARTAN POTASSIUM 100 MG PO TABS: 100.0000 mg | ORAL_TABLET | Freq: Every day | ORAL | 1 refills | Status: DC

## 2016-08-29 MED ORDER — PAROXETINE HCL 10 MG PO TABS: 10.0000 mg | ORAL_TABLET | Freq: Every day | ORAL | 1 refills | Status: DC

## 2016-08-29 MED ORDER — METOPROLOL TARTRATE 50 MG PO TABS: 50.0000 mg | ORAL_TABLET | Freq: Every day | ORAL | 1 refills | Status: DC

## 2016-08-29 MED ORDER — AMLODIPINE BESYLATE 10 MG PO TABS: 10.0000 mg | ORAL_TABLET | Freq: Every day | ORAL | 1 refills | Status: DC

## 2016-08-29 NOTE — Patient Instructions (Signed)
Nice to see you today. Please call to schedule your mammogram. Please schedule appointment for PFT with Dr. Valentina Lucks on your way out today. I will call you with other test result. F/U soon if wart increases in size or becomes symptomatic.

## 2016-08-29 NOTE — Progress Notes (Signed)
Subjective:     Jill [name] is a 65 y.o. female and is here for a comprehensive physical exam. The patient reports problems - Right calf bump, back pain and rash on face. She pulled her back muscle recently.  Social History   Social History  . Marital status: Married    Spouse name: N/A  . Number of children: N/A  . Years of education: N/A   Occupational History  . Not on file.   Social History Main Topics  . Smoking status: Current Every Day Smoker    Packs/day: 1.00    Years: 53.00    Types: Cigarettes  . Smokeless tobacco: Not on file  . Alcohol use No  . Drug use: No  . Sexual activity: Yes    Birth control/ protection: Post-menopausal   Other Topics Concern  . Not on file   Social History Writer.  Some college.  Currently retired.  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 worked in [employer] most recently.      Lives with husband and 2 adult daughters and 4 grandchildren.   Health Maintenance  Topic Date Due  . Hepatitis C Screening  [birth year]  . MAMMOGRAM  03/09/1999  . COLONOSCOPY  03/09/1999  . ZOSTAVAX  03/08/2009  . DEXA SCAN  03/08/2014  . PNA vac Low Risk Adult (1 of 2 - PCV13) 03/08/2014  . INFLUENZA VACCINE  06/06/2016  . TETANUS/TDAP  06/05/2022    The following portions of the patient's history were reviewed and updated as appropriate: allergies, current medications, past family history, past medical history, past social history, past surgical history and problem list.  Review of Systems Pertinent items noted in HPI and remainder of comprehensive ROS otherwise negative.   Objective:    BP 124/72   Pulse 66   Temp 98.1 F (36.7 C) (Oral)   Wt 156 lb (70.8 kg)   SpO2 93%   BMI 24.43 kg/m  General appearance: alert, cooperative and appears stated age Head: Normocephalic, without obvious abnormality, atraumatic Eyes: conjunctivae/corneas clear. PERRL, EOM's intact. Fundi benign. Ears: normal TM's and  external ear canals both ears Throat: normal findings: lips normal without lesions, gums healthy, tongue midline and normal, soft palate, uvula, and tonsils normal and oropharynx pink & moist without lesions or evidence of thrush and abnormal findings: dentition: poor Neck: no adenopathy, no carotid bruit, no JVD, supple, symmetrical, trachea midline and thyroid not enlarged, symmetric, no tenderness/mass/nodules Back: symmetric, no curvature. ROM normal. No CVA tenderness., mild lower spine tenderness. Normal ROM Lungs: ++ Faint Rhonchi globally Heart: regular rate and rhythm, S1, S2 normal, no murmur, click, rub or gallop Abdomen: soft, non-tender; bowel sounds normal; no masses,  no organomegaly Pelvic: cervix normal in appearance, external genitalia normal, no adnexal masses or tenderness, no cervical motion tenderness, rectovaginal septum normal, uterus normal size, shape, and consistency and vagina normal without discharge Extremities: extremities normal, atraumatic, no cyanosis or edema Pulses: 2+ and symmetric Skin: small, 0.2 cm raise lesion on her right calf Neurologic: Alert and oriented X 3, normal strength and tone. Normal symmetric reflexes. Normal coordination and gait      Assessment:    Healthy female exam. With abnormal finding Skin lesion  Back pain Smoking Syncope and QT prolongation      Plan:   Exam otherwise normal. For her right calf lesion, I suspect its wart. We can watch and wait since its been there only for two week. F/U  soon for reassessment For her back pain, may use OTC pain regimen prn for now. F/U soon if worsening. Smoking cessation counseling done. She is not interested in quitting. Lungs sound abnormal and she coughed occasionally during today's visit. I recommended PFT with Dr. Valentina Lucks. She will schedule this. Pneumonia and flu shot given today. Mammogram recommended. Scheduling slip given. She agreed to call for her appointment. HIV and Hep C  screening done after informed consent verbally. TSH checked for her hypothyroidism. CMET, FLP checked as well today ED record reviewed for syncope. Currently asymptomatic. Cardiology referral completed today, ECHO ordered. PAP completed. Will call her soon with result. All meds were refilled except Synthroid. I will refill it once I have her TSH result. She requested that her pharmacy be changed to [facility] on [address]

## 2016-08-29 NOTE — Telephone Encounter (Signed)
I scheduled pt an echo apt for 11/10 '@2pm'$ . I called pt to let her know, unable to reach her, VM left for pt to call back.

## 2016-08-30 ENCOUNTER — Telehealth: Payer: Self-pay | Admitting: Family Medicine

## 2016-08-30 LAB — CYTOLOGY - PAP
Diagnosis: NEGATIVE
HPV: NOT DETECTED

## 2016-08-30 LAB — HEPATITIS C ANTIBODY: HCV Ab: NEGATIVE

## 2016-08-30 LAB — HIV ANTIBODY (ROUTINE TESTING W REFLEX): HIV 1&2 Ab, 4th Generation: NONREACTIVE

## 2016-08-30 NOTE — Telephone Encounter (Signed)
Contacted again about test result. Patient did not pick up.   PAP normal.

## 2016-08-30 NOTE — Telephone Encounter (Addendum)
I called patient on both listed numbers, message left to call back. I spoke with her husband on one of the phone numbers listed and he said he will have her call me back.  No HIPPA violation.  Results: Normal HIV and Hep C, TSH slightly elevated but improved from the last result 2 yrs ago.

## 2016-09-01 ENCOUNTER — Ambulatory Visit (INDEPENDENT_AMBULATORY_CARE_PROVIDER_SITE_OTHER): Payer: Medicare Other | Admitting: Pharmacist

## 2016-09-01 ENCOUNTER — Encounter: Payer: Self-pay | Admitting: Pharmacist

## 2016-09-01 DIAGNOSIS — J449 Chronic obstructive pulmonary disease, unspecified: Secondary | ICD-10-CM | POA: Insufficient documentation

## 2016-09-01 DIAGNOSIS — I10 Essential (primary) hypertension: Secondary | ICD-10-CM

## 2016-09-01 DIAGNOSIS — J441 Chronic obstructive pulmonary disease with (acute) exacerbation: Secondary | ICD-10-CM | POA: Insufficient documentation

## 2016-09-01 DIAGNOSIS — Z720 Tobacco use: Secondary | ICD-10-CM

## 2016-09-01 MED ORDER — METOPROLOL SUCCINATE ER 50 MG PO TB24: 50.0000 mg | ORAL_TABLET | Freq: Every day | ORAL | 3 refills | Status: DC

## 2016-09-01 MED ORDER — FLUTICASONE FUROATE-VILANTEROL 100-25 MCG/INH IN AEPB: 1.0000 | INHALATION_SPRAY | Freq: Every day | RESPIRATORY_TRACT | 2 refills | Status: DC

## 2016-09-01 NOTE — Patient Instructions (Addendum)
Nice to meet you today.   Please work hard with cutting down your cigarette use to 20 cigarettes per day or less.   Please start the BREO inhaler once daily.  Please remember to rinse your mouth after taking.   Metoprolol Succinate '50mg'$  once daily.   Follow up with Dr. Gwendlyn Deutscher in 1-2 weeks.

## 2016-09-01 NOTE — Assessment & Plan Note (Signed)
Tobacco Abuse, history of severe abuse with current intake of ~ '30mg'$  of nicotine per day.  Willing to attempt reduction in intake (reduce smoking) in the near future.  Set goal of smoking at 1 ppd or less and consider total cessation after the holiday season (2 months).

## 2016-09-01 NOTE — Assessment & Plan Note (Signed)
Spirometry evaluation reveals moderately severe obstructive lung disease with reversibility of ~10 % in FEV1 and FVC corresponding to GOLD ACOS.  Post nebulized albuterol tx revealed improved symptoms.   Patient has been experiencing cough and shortness of breath for 4-6 weeks and reports breathing is slightly less than maximal breathing over the last few months.  At this time, not taking any inhalers. Initiated Breo '100mg''25mg'$  once daily.  Educated patient on purpose, proper use, potential adverse effects including the risk of esophageal candidiasis and need to rinse mouth after each use.  Reviewed results of pulmonary function tests.  Pt verbalized understanding of results and education.  Written pt instructions provided.

## 2016-09-01 NOTE — Progress Notes (Signed)
S:    Patient arrives with daughter Jill [name].    Presents for lung function evaluation.   Patient was referred on 08/29/2016.  Patient was last seen by Primary Care Provider on 08/29/2016.  Patient reports breathing has been less than her best for the last month.   She rates her breathing today as an 8/10 compared to her breathing normally of the last year.  Patient did report a history of asthma as a child, which she states she believes she "grew out of asthma".  Patient reports she is not currently taking any inhaler medications.   Age when started using tobacco on a daily basis 65yo. Number of Cigarettes per day 30-40. Brand smoked [place] non-filter - 9 years, [place] 5 years, [place] - Many years and [place] Blue for the last 10 years. Estimated Nicotine Content per Cigarette (mg) 1.  Estimated Nicotine intake per day 30-'40mg'$  per day.   Smokes first cigarette <5 minutes after waking. Smokes times per night.    Estimated Fagerstrom Score > 6/10.   Most recent quit attempt - 6 months and also quit for ~ 1 month after her open heart surgery.  Longest time ever been tobacco free 6 months.  Medications (NRT, bupropion, varenicline) used in prior in past cessation efforts include: varenicline which caused negative CNS effects and should be avoided in the past.   Most common triggers to use tobacco include; Boredom, Habit - states she smokes more during winter as she is more active outside during the spring, summer and fall.   Motivation to quit: Health, however states she still enjoys smoking.    O: mMRC score= > 2 See Documentation Flowsheet - CAT/COPD for complete symptom scoring.  See "scanned report" or Documentation Flowsheet (discrete results - PFTs) for  Spirometry results. Patient provided good effort while attempting spirometry.   Lung Age = 99 Albuterol Neb  [id number]     Exp. 12/2017  A/P: Spirometry evaluation reveals moderately severe obstructive lung disease with  reversibility of ~10 % in FEV1 and FVC corresponding to GOLD ACOS.  Post nebulized albuterol tx revealed improved symptoms.   Patient has been experiencing cough and shortness of breath for 4-6 weeks and reports breathing is slightly less than maximal breathing over the last few months.  At this time, not taking any inhalers. Initiated Breo '100mg''25mg'$  once daily.  Educated patient on purpose, proper use, potential adverse effects including the risk of esophageal candidiasis and need to rinse mouth after each use.  Reviewed results of pulmonary function tests.  Pt verbalized understanding of results and education.  Written pt instructions provided.  Tobacco Abuse, history of severe abuse with current intake of ~ '30mg'$  of nicotine per day.  Willing to attempt reduction in intake (reduce smoking) in the near future.  Set goal of smoking at 1 ppd or less and consider total cessation after the holiday season (2 months).    Hypertension - managed with with metoprolol titrate once daily '50mg'$  with a complaint of fatigue.   Blood pressure at goal in office today.   Will switch to once daily metoprolol succinate '50mg'$  once daily.   Patient verbalized understanding of change and need to switch as soon as she can pick up new supply from her pharmacy.   Written pt instructions provided.  F/U Clinic visit with Dr. Gwendlyn Deutscher in  1-2 weeks.   Total time in face to face counseling 40 minutes.  Patient seen with Shonna Chock, PharmD Candidate and Davy Pique  Ouida Sills, PharmD [place].  Marland Kitchen

## 2016-09-01 NOTE — Assessment & Plan Note (Signed)
Hypertension - managed with with metoprolol titrate once daily '50mg'$  with a complaint of fatigue.   Blood pressure at goal in office today.   Will switch to once daily metoprolol succinate '50mg'$  once daily.   Patient verbalized understanding of change and need to switch as soon as she can pick up new supply from her pharmacy.

## 2016-09-01 NOTE — Progress Notes (Signed)
Patient ID: Jill [name], female   DOB: 1951/05/28, 65 y.o.   MRN: [id number] Reviewed: Agree with Dr. Graylin Shiver documentation and management.

## 2016-09-13 ENCOUNTER — Other Ambulatory Visit: Payer: Self-pay | Admitting: Family Medicine

## 2016-09-13 DIAGNOSIS — E785 Hyperlipidemia, unspecified: Secondary | ICD-10-CM

## 2016-09-13 MED ORDER — LOSARTAN POTASSIUM 100 MG PO TABS: 100.0000 mg | ORAL_TABLET | Freq: Every day | ORAL | 1 refills | Status: DC

## 2016-09-13 MED ORDER — PAROXETINE HCL 10 MG PO TABS: 10.0000 mg | ORAL_TABLET | Freq: Every day | ORAL | 1 refills | Status: DC

## 2016-09-13 MED ORDER — LEVOTHYROXINE SODIUM 150 MCG PO TABS: 150.0000 ug | ORAL_TABLET | Freq: Every day | ORAL | 1 refills | Status: DC

## 2016-09-13 MED ORDER — ATORVASTATIN CALCIUM 20 MG PO TABS: 20.0000 mg | ORAL_TABLET | Freq: Every day | ORAL | 1 refills | Status: DC

## 2016-09-13 NOTE — Telephone Encounter (Signed)
Pt needs refills on atorvastatin, Paxil, losartan, and synthroid. Pt can't afford inhaler PCP suggested, its over $200. Pt would also like pap smear results. Please advise. Thanks!

## 2016-09-13 NOTE — Telephone Encounter (Signed)
Patient did not get back to me after multiple attempts to reach her about her test result. For now I will continue same dose of her Synthroid. Repeat TSH in about 6 months. Increase Lipitor to 20 mg qd. Med refill completed.

## 2016-09-15 ENCOUNTER — Ambulatory Visit (HOSPITAL_COMMUNITY): Payer: Medicare Other | Attending: Cardiovascular Disease

## 2016-09-25 ENCOUNTER — Telehealth: Payer: Self-pay | Admitting: Family Medicine

## 2016-09-25 NOTE — Telephone Encounter (Signed)
Patient states the Paxil was never received by [facility] on [address]. Please call med in for patient.

## 2016-09-25 NOTE — Telephone Encounter (Signed)
[facility] was called, patient's Rx for Paxil is ready for pick up.  Derl Barrow, RN

## 2016-09-26 ENCOUNTER — Ambulatory Visit: Payer: Medicare Other | Admitting: Interventional Cardiology

## 2016-09-27 ENCOUNTER — Encounter: Payer: Self-pay | Admitting: Interventional Cardiology

## 2016-10-03 ENCOUNTER — Encounter: Payer: Self-pay | Admitting: Family Medicine

## 2016-10-03 ENCOUNTER — Telehealth: Payer: Self-pay | Admitting: Family Medicine

## 2016-10-03 NOTE — Telephone Encounter (Signed)
I spoke with pt and she states she prefers the '40mg'$ , however, since she has already paid for the '10mg'$  she is going to take 4 '10mg'$  until she runs out. The pt does not want you to refill her '40mg'$  at this time [facility]," per patient. Just a FYI, Dr. Gwendlyn Deutscher.

## 2016-10-03 NOTE — Telephone Encounter (Signed)
Will forward to MD to advise. Dany Harten,CMA  

## 2016-10-03 NOTE — Telephone Encounter (Signed)
Please advise patient that I had Paxil 10 mg on file for her and that was why I refilled that. I just went back to review what she was on in the past. I see the 40 mg also. If she had done well on 40 mg without S/E please let me know and I will go ahead and refill 40 mg instead and take the 10 mg off her list to avoid future confusion.

## 2016-10-03 NOTE — Telephone Encounter (Signed)
When pt picked up her Paxil, it was for '10mg'$  not the 40 she had been taking. She wants to know why the change. She says her head is hurting.

## 2016-10-04 ENCOUNTER — Other Ambulatory Visit: Payer: Self-pay | Admitting: Family Medicine

## 2016-10-04 MED ORDER — PAROXETINE HCL 40 MG PO TABS: 40.0000 mg | ORAL_TABLET | Freq: Every day | ORAL | 1 refills | Status: DC

## 2016-10-26 ENCOUNTER — Other Ambulatory Visit: Payer: Self-pay | Admitting: Family Medicine

## 2016-10-26 MED ORDER — PAROXETINE HCL 40 MG PO TABS: 40.0000 mg | ORAL_TABLET | Freq: Every day | ORAL | 1 refills | Status: DC

## 2016-10-26 NOTE — Telephone Encounter (Signed)
Pt called and would like a refill on her Paxil sent to her [facility]

## 2017-02-01 ENCOUNTER — Other Ambulatory Visit: Payer: Self-pay | Admitting: *Deleted

## 2017-02-01 NOTE — Telephone Encounter (Signed)
On clindamycin for cellulitis (dx'd at [facility]).  Getting another episode of "hairy tongue" and needs refill of Nystatin.  Tongue swollen and red, hurts.  Will route request to PCP.  Burna Forts, BSN, RN-BC

## 2017-02-01 NOTE — Telephone Encounter (Signed)
LM with a female at home asking him to have patient call us back. Jazmin Hartsell,CMA

## 2017-02-01 NOTE — Telephone Encounter (Signed)
Please advise patient. Her symptoms does not fit thrush hence Nystatin is not the treatment. I worry about her swollen tongue, redness and pain. I will prefer she been seen by a provider to assess her and recommend appropriate treatment regimen. If she is able to get here before closing I can lay eye on her real quick, otherwise advise her to go to the urgent care. Thanks.

## 2017-02-01 NOTE — Telephone Encounter (Signed)
Pt called back and PCP recommendation relayed to her. Pt was advised to go to urgent care, since she can not make it to clinic this afternoon. Pt stated she does not have a ride, "ill see what I can do." Pt does have an apt with pcp scheduled for April 3rd.

## 2017-02-06 ENCOUNTER — Ambulatory Visit (INDEPENDENT_AMBULATORY_CARE_PROVIDER_SITE_OTHER): Payer: Medicare Other | Admitting: Family Medicine

## 2017-02-06 ENCOUNTER — Encounter: Payer: Self-pay | Admitting: Family Medicine

## 2017-02-06 VITALS — BP 118/60 | HR 69 | Temp 97.9°F | Wt 162.0 lb

## 2017-02-06 DIAGNOSIS — E785 Hyperlipidemia, unspecified: Secondary | ICD-10-CM | POA: Diagnosis not present

## 2017-02-06 DIAGNOSIS — R7309 Other abnormal glucose: Secondary | ICD-10-CM

## 2017-02-06 DIAGNOSIS — J449 Chronic obstructive pulmonary disease, unspecified: Secondary | ICD-10-CM | POA: Diagnosis not present

## 2017-02-06 DIAGNOSIS — Z0000 Encounter for general adult medical examination without abnormal findings: Secondary | ICD-10-CM | POA: Diagnosis not present

## 2017-02-06 DIAGNOSIS — E2839 Other primary ovarian failure: Secondary | ICD-10-CM | POA: Diagnosis not present

## 2017-02-06 DIAGNOSIS — G8929 Other chronic pain: Secondary | ICD-10-CM

## 2017-02-06 DIAGNOSIS — M25562 Pain in left knee: Secondary | ICD-10-CM

## 2017-02-06 DIAGNOSIS — Z1211 Encounter for screening for malignant neoplasm of colon: Secondary | ICD-10-CM

## 2017-02-06 DIAGNOSIS — E039 Hypothyroidism, unspecified: Secondary | ICD-10-CM | POA: Diagnosis not present

## 2017-02-06 DIAGNOSIS — K0889 Other specified disorders of teeth and supporting structures: Secondary | ICD-10-CM

## 2017-02-06 DIAGNOSIS — R7303 Prediabetes: Secondary | ICD-10-CM | POA: Diagnosis not present

## 2017-02-06 HISTORY — DX: Other specified disorders of teeth and supporting structures: K08.89

## 2017-02-06 LAB — POCT GLYCOSYLATED HEMOGLOBIN (HGB A1C): HEMOGLOBIN A1C: 6.3

## 2017-02-06 MED ORDER — BUDESONIDE-FORMOTEROL FUMARATE 80-4.5 MCG/ACT IN AERO: 2.0000 | INHALATION_SPRAY | Freq: Two times a day (BID) | RESPIRATORY_TRACT | 3 refills | Status: DC

## 2017-02-06 NOTE — Assessment & Plan Note (Signed)
Secondary to dental abscess now resolving on A/B. Continue to take A/B as prescribed. She will contact [facility] dentist for follow up. F/U prn.

## 2017-02-06 NOTE — Patient Instructions (Signed)
Colonoscopy, Adult  A colonoscopy is an exam to look at the large intestine. It is done to check for problems, such as:  · Lumps (tumors).  · Growths (polyps).  · Swelling (inflammation).  · Bleeding.    What happens before the procedure?  Eating and drinking   Follow instructions from your doctor about eating and drinking. These instructions may include:  · A few days before the procedure - follow a low-fiber diet.  ? Avoid nuts.  ? Avoid seeds.  ? Avoid dried fruit.  ? Avoid raw fruits.  ? Avoid vegetables.  · 1-3 days before the procedure - follow a clear liquid diet. Avoid liquids that have red or purple dye. Drink only clear liquids, such as:  ? Clear broth or bouillon.  ? Black coffee or tea.  ? Clear juice.  ? Clear soft drinks or sports drinks.  ? Gelatin dessert.  ? Popsicles.  · On the day of the procedure - do not eat or drink anything during the 2 hours before the procedure.    Bowel prep   If you were prescribed an oral bowel prep:  · Take it as told by your doctor. Starting the day before your procedure, you will need to drink a lot of liquid. The liquid will cause you to poop (have bowel movements) until your poop is almost clear or light green.  · If your skin or butt gets irritated from diarrhea, you may:  ? Wipe the area with wipes that have medicine in them, such as adult wet wipes with aloe and vitamin E.  ? Put something on your skin that soothes the area, such as petroleum jelly.  · If you throw up (vomit) while drinking the bowel prep, take a break for up to 60 minutes. Then begin the bowel prep again. If you keep throwing up and you cannot take the bowel prep without throwing up, call your doctor.    General instructions   · Ask your doctor about changing or stopping your normal medicines. This is important if you take diabetes medicines or blood thinners.  · Plan to have someone take you home from the hospital or clinic.  What happens during the procedure?  · An IV tube may be put into one  of your veins.  · You will be given medicine to help you relax (sedative).  · To reduce your risk of infection:  ? Your doctors will wash their hands.  ? Your anal area will be washed with soap.  · You will be asked to lie on your side with your knees bent.  · Your doctor will get a long, thin, flexible tube ready. The tube will have a camera and a light on the end.  · The tube will be put into your anus.  · The tube will be gently put into your large intestine.  · Air will be delivered into your large intestine to keep it open. You may feel some pressure or cramping.  · The camera will be used to take photos.  · A small tissue sample may be removed from your body to be looked at under a microscope (biopsy). If any possible problems are found, the tissue will be sent to a lab for testing.  · If small growths are found, your doctor may remove them and have them checked for cancer.  · The tube that was put into your anus will be slowly removed.  The procedure may vary among doctors   and hospitals.  What happens after the procedure?  · Your doctor will check on you often until the medicines you were given have worn off.  · Do not drive for 24 hours after the procedure.  · You may have a small amount of blood in your poop.  · You may pass gas.  · You may have mild cramps or bloating in your belly (abdomen).  · It is up to you to get the results of your procedure. Ask your doctor, or the department performing the procedure, when your results will be ready.  This information is not intended to replace advice given to you by your health care provider. Make sure you discuss any questions you have with your health care provider.  Document Released: 11/25/2010 Document Revised: 08/23/2016 Document Reviewed: 01/04/2016  Elsevier Interactive Patient Education © 2017 [employer]

## 2017-02-06 NOTE — Assessment & Plan Note (Signed)
Compliant with her meds. Fish-oil recommended in addition to Statin given hypertriglyceridemia. She agreed with plan. Continue ASA.

## 2017-02-06 NOTE — Assessment & Plan Note (Signed)
Counseling done on need for Dexa scan and mammogram. She agreed to schedule appointment for both. I ordered dexa scan during today's visit. Colon cancer screening done. She does not want colonoscopy. Cologaurd ordered. She signed paper work for this. I will contact her with result.

## 2017-02-06 NOTE — Assessment & Plan Note (Signed)
Compliant with meds. TSH checked today. I will contact her with result.

## 2017-02-06 NOTE — Progress Notes (Signed)
Subjective:     Patient ID: Jill [name], female   DOB: Feb 13, 1951, 66 y.o.   MRN: [id number]  Thyroid Problem  Presents for follow-up visit. Patient reports no cold intolerance, constipation, diarrhea, heat intolerance, leg swelling, tremors, visual change or weight loss. Her past medical history is significant for hyperlipidemia.  Hyperlipidemia  This is a chronic problem. The current episode started more than 1 year ago. Recent lipid tests were reviewed and are variable. Exacerbating diseases include hypothyroidism. There are no known factors aggravating her hyperlipidemia. Pertinent negatives include no chest pain, focal sensory loss, leg pain or myalgias. Current antihyperlipidemic treatment includes statins. There are no compliance problems.   Knee Pain   Incident onset: left knee pain for many years. There was no injury mechanism. The pain is present in the left knee. The pain is at a severity of 2/10 (pain with ambulation). The pain is mild. The pain has been fluctuating since onset. Pertinent negatives include no inability to bear weight, loss of motion or loss of sensation. Associated symptoms comments: Feels like her knee cap gives up. It also swells up. She has tried nothing for the symptoms.  Dental: C/O right upper molar pain with facial and gum swelling which started 2 weeks ago, she went to the ED at [facility]. She was started on Clindamycin. She has since felt better, swelling has gone down. She does not have a dentist currently. She has few more doses of Clindamycin to complete. Pre-DM: Here for follow up. COPD: She stopped taking Breo since it was too expensive for her. She has cut back on smoking to 1 PPD and is still working on it. Still having issues with frequent coughing and occasional SOB. HM: No concern, here for follow up. She is yet to schedule her mammogram or Dexa scan.  ASA, Fish oil, mammog, colonos, dexa  Current Outpatient Prescriptions on File Prior to Visit    Medication Sig Dispense Refill  . amLODipine (NORVASC) 10 MG tablet Take 1 tablet (10 mg total) by mouth daily. 90 tablet 1  . aspirin EC 81 MG tablet Take 81 mg by mouth daily.    Marland Kitchen atorvastatin (LIPITOR) 20 MG tablet Take 1 tablet (20 mg total) by mouth daily. 90 tablet 1  . levothyroxine (SYNTHROID, LEVOTHROID) 150 MCG tablet Take 1 tablet (150 mcg total) by mouth daily. 90 tablet 1  . losartan (COZAAR) 100 MG tablet Take 1 tablet (100 mg total) by mouth daily. 90 tablet 1  . metoprolol succinate (TOPROL-XL) 50 MG 24 hr tablet Take 1 tablet (50 mg total) by mouth daily. Take with or immediately following a meal. 90 tablet 3  . omeprazole (PRILOSEC) 40 MG capsule Take 40 mg by mouth every 3 (three) days.    Marland Kitchen PARoxetine (PAXIL) 40 MG tablet Take 1 tablet (40 mg total) by mouth daily. 90 tablet 1  . fluticasone furoate-vilanterol (BREO ELLIPTA) 100-25 MCG/INH AEPB Inhale 1 puff into the lungs daily. (Patient not taking: Reported on 02/06/2017) 1 each 2  . nitroGLYCERIN (NITROSTAT) 0.4 MG SL tablet Place 1 tablet (0.4 mg total) under the tongue every 5 (five) minutes as needed. For chest pain (Patient not taking: Reported on 09/01/2016) 25 tablet 6   No current facility-administered medications on file prior to visit.    Past Medical History:  Diagnosis Date  . Anxiety    ON PAXIL, XANAX  . AVM (arteriovenous malformation) brain    s/p stent/coil  . Blood transfusion   .  Coronary artery disease    Prior inferior MI with stent to RCA, s/p CABG in 2008  . Dizziness   . Fatigue   . GERD (gastroesophageal reflux disease)   . Headache(784.0)    UNRUPTURED CEREBRAL ANEURYSM  . Hyperlipidemia   . Hypertension   . Hypothyroidism   . Infected cyst of skin 09/18/2013  . Myocardial infarction   . Normal nuclear stress test Ju;y 2012   No ischemia. EF 70%; fixed defect involving septum, inferoseptal and inferior wall  . Retroperitoneal bleeding    Following cardiac cath  . Tobacco abuse   .  Urine discoloration 09/18/2013  . UTI (lower urinary tract infection) 10/16/2013   Vitals:   02/06/17 0959  BP: 118/60  Pulse: 69  Temp: 97.9 F (36.6 C)  TempSrc: Oral  SpO2: 92%  Weight: 162 lb (73.5 kg)     Review of Systems  Constitutional: Negative for fever and weight loss.  HENT: Positive for dental problem. Negative for facial swelling.   Respiratory: Positive for cough.   Cardiovascular: Negative.  Negative for chest pain.  Gastrointestinal: Negative.  Negative for constipation and diarrhea.  Endocrine: Negative for cold intolerance and heat intolerance.  Musculoskeletal: Negative for myalgias.  Neurological: Negative for tremors.  All other systems reviewed and are negative.      Objective:   Physical Exam  Constitutional: She is oriented to person, place, and time. She appears well-developed. No distress.  HENT:  Head:    Mouth/Throat: She does not have dentures. Abnormal dentition. Dental caries present. No dental abscesses. No oropharyngeal exudate, posterior oropharyngeal edema or tonsillar abscesses.    Neck: No thyromegaly present.  Cardiovascular: Normal rate, regular rhythm and normal heart sounds.   No murmur heard. Pulmonary/Chest: Effort normal and breath sounds normal. No respiratory distress. She has no wheezes.  Abdominal: Soft. Bowel sounds are normal. She exhibits no distension and no mass. There is no tenderness.  Musculoskeletal:       Left knee: She exhibits swelling. She exhibits normal range of motion and no bony tenderness. No tenderness found.  Neurological: She is alert and oriented to person, place, and time. No cranial nerve deficit.  Psychiatric: She has a normal mood and affect.  Nursing note and vitals reviewed.      Assessment:     Hypothyroidism HLD Left Knee pain Dental abscess Pre-DM COPD Health maintenance     Plan:     Check problem list.

## 2017-02-06 NOTE — Assessment & Plan Note (Signed)
A1C rechecked today. She is still in the prediabetic range. She is not interested in starting medication at this time. Diet and exercise counseling done. F/U in 3 months.

## 2017-02-06 NOTE — Assessment & Plan Note (Signed)
Unable to afford Breo. Med d/ced and she was started on Symbicort instead. Med e-scribed today.

## 2017-02-06 NOTE — Assessment & Plan Note (Signed)
Chronic recurrent. Currently asymptomatic with pain but does have some swelling around her joint. Xray ordered. May use NSAID in the meantime. I will contact her with xray result.

## 2017-02-07 ENCOUNTER — Telehealth: Payer: Self-pay | Admitting: Family Medicine

## 2017-02-07 LAB — TSH: TSH: 11.91 u[IU]/mL — ABNORMAL HIGH (ref 0.450–4.500)

## 2017-02-07 MED ORDER — LEVOTHYROXINE SODIUM 175 MCG PO TABS: 175.0000 ug | ORAL_TABLET | Freq: Every day | ORAL | 1 refills | Status: DC

## 2017-02-07 NOTE — Telephone Encounter (Signed)
I called to discuss her TSH result with her.Patient stated she has been compliant with synthroid 150 mcg qd. I will increase her dose to 175 mcg qd. She agreed with plan. Medication e-scribed.

## 2017-06-01 ENCOUNTER — Encounter: Payer: Self-pay | Admitting: Family Medicine

## 2017-06-01 ENCOUNTER — Ambulatory Visit (INDEPENDENT_AMBULATORY_CARE_PROVIDER_SITE_OTHER): Payer: Medicare Other | Admitting: Family Medicine

## 2017-06-01 VITALS — BP 112/58 | HR 77 | Temp 98.0°F | Ht 67.0 in | Wt 162.0 lb

## 2017-06-01 DIAGNOSIS — E039 Hypothyroidism, unspecified: Secondary | ICD-10-CM

## 2017-06-01 DIAGNOSIS — S99922A Unspecified injury of left foot, initial encounter: Secondary | ICD-10-CM | POA: Diagnosis not present

## 2017-06-01 DIAGNOSIS — E2839 Other primary ovarian failure: Secondary | ICD-10-CM | POA: Diagnosis not present

## 2017-06-01 DIAGNOSIS — S99929A Unspecified injury of unspecified foot, initial encounter: Secondary | ICD-10-CM

## 2017-06-01 DIAGNOSIS — K219 Gastro-esophageal reflux disease without esophagitis: Secondary | ICD-10-CM

## 2017-06-01 DIAGNOSIS — I10 Essential (primary) hypertension: Secondary | ICD-10-CM

## 2017-06-01 DIAGNOSIS — E785 Hyperlipidemia, unspecified: Secondary | ICD-10-CM | POA: Diagnosis not present

## 2017-06-01 DIAGNOSIS — J449 Chronic obstructive pulmonary disease, unspecified: Secondary | ICD-10-CM | POA: Diagnosis not present

## 2017-06-01 DIAGNOSIS — Z1211 Encounter for screening for malignant neoplasm of colon: Secondary | ICD-10-CM | POA: Diagnosis not present

## 2017-06-01 HISTORY — DX: Unspecified injury of unspecified foot, initial encounter: S99.929A

## 2017-06-01 MED ORDER — PAROXETINE HCL 40 MG PO TABS: 40.0000 mg | ORAL_TABLET | Freq: Every day | ORAL | 1 refills | Status: DC

## 2017-06-01 MED ORDER — FLUTICASONE PROPIONATE HFA 110 MCG/ACT IN AERO: 2.0000 | INHALATION_SPRAY | Freq: Two times a day (BID) | RESPIRATORY_TRACT | 12 refills | Status: DC

## 2017-06-01 MED ORDER — AMLODIPINE BESYLATE 10 MG PO TABS: 10.0000 mg | ORAL_TABLET | Freq: Every day | ORAL | 1 refills | Status: DC

## 2017-06-01 MED ORDER — METOPROLOL SUCCINATE ER 50 MG PO TB24: 50.0000 mg | ORAL_TABLET | Freq: Every day | ORAL | 3 refills | Status: DC

## 2017-06-01 MED ORDER — OMEPRAZOLE 20 MG PO CPDR: 20.0000 mg | DELAYED_RELEASE_CAPSULE | Freq: Every day | ORAL | 1 refills | Status: DC

## 2017-06-01 MED ORDER — ATORVASTATIN CALCIUM 20 MG PO TABS: 20.0000 mg | ORAL_TABLET | Freq: Every day | ORAL | 1 refills | Status: DC

## 2017-06-01 MED ORDER — LOSARTAN POTASSIUM 100 MG PO TABS: 100.0000 mg | ORAL_TABLET | Freq: Every day | ORAL | 1 refills | Status: DC

## 2017-06-01 NOTE — Assessment & Plan Note (Signed)
BP looks good today. Med refilled.

## 2017-06-01 NOTE — Assessment & Plan Note (Signed)
Compliant with Statin. Med refilled.

## 2017-06-01 NOTE — Assessment & Plan Note (Signed)
She is compliant with her Synthroid. TSH rechecked. Will call with result.

## 2017-06-01 NOTE — Assessment & Plan Note (Signed)
Likely a sprain. She has full ROM of her foot. I offered xray but she stated symptoms is improving. There is a less likelihood for fracture. Use Tylenol as needed for pain. Wear soft comfortable show. Monitor for improvement.

## 2017-06-01 NOTE — Assessment & Plan Note (Addendum)
Advised not to use husband's meds. Omeprazole prescribed. Need colonoscopy. Referral to GI placed.

## 2017-06-01 NOTE — Progress Notes (Signed)
Subjective:     Patient ID: Jill [name], female   DOB: 18-Apr-1951, 66 y.o.   MRN: [id number]  HPI HTN/HLD: She is compliant with meds. She need her refill. Hypothyroidism:She is compliant with her meds. Here for follow-up. Heart Burn:C/O worsening burning sensation o her chest. She has been using her husband's Omeprazole 20 mg qd which helps some. Denies other GI symptoms. Foot pain: C/O left foot pain on the plantar surface following a fall off stair more than 1 week ago. She stated her plantar is bruised and painful. She has been walking on her heel since then. Pain has improved a lot in the last few days. She just wanted to get checked. COPD:She did not pick up Symbicort due to price. HM: Here for routine health care.  Current Outpatient Prescriptions on File Prior to Visit  Medication Sig Dispense Refill  . amLODipine (NORVASC) 10 MG tablet Take 1 tablet (10 mg total) by mouth daily. 90 tablet 1  . aspirin EC 81 MG tablet Take 81 mg by mouth daily.    Marland Kitchen atorvastatin (LIPITOR) 20 MG tablet Take 1 tablet (20 mg total) by mouth daily. 90 tablet 1  . levothyroxine (SYNTHROID, LEVOTHROID) 175 MCG tablet Take 1 tablet (175 mcg total) by mouth daily. 90 tablet 1  . losartan (COZAAR) 100 MG tablet Take 1 tablet (100 mg total) by mouth daily. 90 tablet 1  . metoprolol succinate (TOPROL-XL) 50 MG 24 hr tablet Take 1 tablet (50 mg total) by mouth daily. Take with or immediately following a meal. 90 tablet 3  . PARoxetine (PAXIL) 40 MG tablet Take 1 tablet (40 mg total) by mouth daily. 90 tablet 1  . budesonide-formoterol (SYMBICORT) 80-4.5 MCG/ACT inhaler Inhale 2 puffs into the lungs 2 (two) times daily. (Patient not taking: Reported on 06/01/2017) 1 Inhaler 3  . fluticasone furoate-vilanterol (BREO ELLIPTA) 100-25 MCG/INH AEPB Inhale 1 puff into the lungs daily. (Patient not taking: Reported on 02/06/2017) 1 each 2  . nitroGLYCERIN (NITROSTAT) 0.4 MG SL tablet Place 1 tablet (0.4 mg total) under the  tongue every 5 (five) minutes as needed. For chest pain (Patient not taking: Reported on 09/01/2016) 25 tablet 6  . omeprazole (PRILOSEC) 40 MG capsule Take 40 mg by mouth every 3 (three) days.     No current facility-administered medications on file prior to visit.    Past Medical History:  Diagnosis Date  . Anxiety    ON PAXIL, XANAX  . AVM (arteriovenous malformation) brain    s/p stent/coil  . Blood transfusion   . Coronary artery disease    Prior inferior MI with stent to RCA, s/p CABG in 2008  . Dizziness   . Fatigue   . GERD (gastroesophageal reflux disease)   . Headache(784.0)    UNRUPTURED CEREBRAL ANEURYSM  . Hyperlipidemia   . Hypertension   . Hypothyroidism   . Infected cyst of skin 09/18/2013  . Myocardial infarction ([place])   . Normal nuclear stress test Ju;y 2012   No ischemia. EF 70%; fixed defect involving septum, inferoseptal and inferior wall  . Retroperitoneal bleeding    Following cardiac cath  . Tobacco abuse   . Urine discoloration 09/18/2013  . UTI (lower urinary tract infection) 10/16/2013   Vitals:   06/01/17 1108  BP: (!) 112/58  Pulse: 77  Temp: 98 F (36.7 C)  TempSrc: Oral  Weight: 162 lb (73.5 kg)  Height: 5\' 7"  (1.702 m)     Review of Systems  Respiratory: Negative.   Cardiovascular: Negative.   Gastrointestinal: Negative.   Musculoskeletal:       Left foot pain  All other systems reviewed and are negative.      Objective:   Physical Exam  Constitutional: She is oriented to person, place, and time. She appears well-developed. No distress.  Cardiovascular: Normal rate, regular rhythm and normal heart sounds.   No murmur heard. Pulmonary/Chest: Effort normal and breath sounds normal. No respiratory distress. She has no wheezes.  Abdominal: Soft. Bowel sounds are normal. She exhibits no distension and no mass.  Musculoskeletal:       Left ankle: She exhibits normal range of motion, no swelling and no deformity.       Left foot:  There is tenderness. There is normal range of motion, no swelling, normal capillary refill, no deformity and no laceration.       Feet:  Neurological: She is alert and oriented to person, place, and time.  Psychiatric: She has a normal mood and affect.  Nursing note and vitals reviewed.      Assessment:     HTN HLD Hypothyroidism GERD Foot pain COPD Health maintenance    Plan:     Colonoscopy recommended. She will call to schedule appointment. I will refer again to GI. Mammogram slip given to schedule her mammogram and I placed order for Dexa scan.   Check problem list for other visit plan.

## 2017-06-01 NOTE — Patient Instructions (Signed)
Foot Sprain A foot sprain is an injury to one of the strong bands of tissue (ligaments) that connect and support the many bones in your feet. The ligament can be stretched too much or it can tear. A tear can be either partial or complete. The severity of the sprain depends on how much of the ligament was damaged or torn. What are the causes? A foot sprain is usually caused by suddenly twisting or pivoting your foot. What increases the risk? This injury is more likely to occur in people who:  Play a sport, such as basketball or football.  Exercise or play a sport without warming up.  Start a new workout or sport.  Suddenly increase how long or hard they exercise or play a sport.  What are the signs or symptoms? Symptoms of this condition start soon after an injury and include:  Pain, especially in the arch of the foot.  Bruising.  Swelling.  Inability to walk or use the foot to support body weight.  How is this diagnosed? This condition is diagnosed with a medical history and physical exam. You may also have imaging tests, such as:  X-rays to make sure there are no broken bones (fractures).  MRI to see if the ligament has torn.  How is this treated? Treatment varies depending on the severity of your sprain. Mild sprains can be treated with rest, ice, compression, and elevation (RICE). If your ligament is overstretched or partially torn, treatment usually involves keeping your foot in a fixed position (immobilization) for a period of time. To help you do this, your health care provider will apply a bandage, splint, or walking boot to keep your foot from moving until it heals. You may also be advised to use crutches or a scooter for a few weeks to avoid bearing weight on your foot while it is healing. If your ligament is fully torn, you may need surgery to reconnect the ligament to the bone. After surgery, a cast or splint will be applied and will need to stay on your foot while it  heals. Your health care provider may also suggest exercises or physical therapy to strengthen your foot. Follow these instructions at home: If You Have a Bandage, Splint, or Walking Boot:  Wear it as directed by your health care provider. Remove it only as directed by your health care provider.  Loosen the bandage, splint, or walking boot if your toes become numb and tingle, or if they turn cold and blue. Bathing  If your health care provider approves bathing and showering, cover the bandage or splint with a watertight plastic bag to protect it from water. Do not let the bandage or splint get wet. Managing pain, stiffness, and swelling  If directed, apply ice to the injured area: ? Put ice in a plastic bag. ? Place a towel between your skin and the bag. ? Leave the ice on for 20 minutes, 2-3 times per day.  Move your toes often to avoid stiffness and to lessen swelling.  Raise (elevate) the injured area above the level of your heart while you are sitting or lying down. Driving  Do not drive or operate heavy machinery while taking pain medicine.  Ask your health care provider when it is safe to drive if you have a bandage, splint, or walking boot on your foot. Activity  Rest as directed by your health care provider.  Do not use the injured foot to support your body weight until your health  care provider says that you can. Use crutches or other supportive devices as directed by your health care provider.  Ask your health care provider what activities are safe for you. Gradually increase how much and how far you walk until your health care provider says it is safe to return to full activity.  Do any exercise or physical therapy as directed by your health care provider. General instructions  If a splint was applied, do not put pressure on any part of it until it is fully hardened. This may take several hours.  Take medicines only as directed by your health care provider. These  include over-the-counter medicines and prescription medicines.  Keep all follow-up visits as directed by your health care provider. This is important.  When you can walk without pain, wear supportive shoes that have stiff soles. Do not wear flip-flops, and do not walk barefoot. Contact a health care provider if:  Your pain is not controlled with medicine.  Your bruising or swelling gets worse or does not get better with treatment.  Your splint or walking boot is damaged. Get help right away if:  You develop severe numbness or tingling in your foot.  Your foot turns blue, white, or gray, and it feels cold. This information is not intended to replace advice given to you by your health care provider. Make sure you discuss any questions you have with your health care provider. Document Released: 04/14/2002 Document Revised: 03/30/2016 Document Reviewed: 08/26/2014 Elsevier Interactive Patient Education  2018 [employer].

## 2017-06-01 NOTE — Assessment & Plan Note (Signed)
Still unable to afford Symbicort. Will cost her $200 out of pocket. Plan to switch to Flovent. If unable to afford I will contact pharmacy for another option. F/U as needed.

## 2017-06-02 LAB — TSH: TSH: 2.19 u[IU]/mL (ref 0.450–4.500)

## 2017-06-04 ENCOUNTER — Other Ambulatory Visit: Payer: Self-pay | Admitting: Family Medicine

## 2017-06-04 ENCOUNTER — Telehealth: Payer: Self-pay

## 2017-06-04 MED ORDER — LEVOTHYROXINE SODIUM 175 MCG PO TABS: 175.0000 ug | ORAL_TABLET | Freq: Every day | ORAL | 1 refills | Status: DC

## 2017-06-04 NOTE — Telephone Encounter (Signed)
Attempted to contact pt and inform of results and medication refills. Pt did not answer, left a message with Doroteo Bradford for for patient to call me back.

## 2017-06-04 NOTE — Telephone Encounter (Signed)
-----   Message from [name], MD sent at 06/04/2017  7:37 AM EDT ----- Please call and inform patient that her TSH is within normal range. I have refilled her medications. I will like to recheck in about 4-6 months. F/U soon if having any concern.

## 2017-08-06 ENCOUNTER — Encounter: Payer: Self-pay | Admitting: Family Medicine

## 2017-10-08 ENCOUNTER — Encounter: Payer: Self-pay | Admitting: Family Medicine

## 2017-10-09 ENCOUNTER — Encounter: Payer: Self-pay | Admitting: Family Medicine

## 2017-10-09 ENCOUNTER — Ambulatory Visit (INDEPENDENT_AMBULATORY_CARE_PROVIDER_SITE_OTHER): Payer: Medicare Other | Admitting: Family Medicine

## 2017-10-09 ENCOUNTER — Other Ambulatory Visit: Payer: Self-pay

## 2017-10-09 VITALS — BP 110/60 | HR 72 | Temp 98.5°F | Ht 67.0 in | Wt 159.0 lb

## 2017-10-09 DIAGNOSIS — J449 Chronic obstructive pulmonary disease, unspecified: Secondary | ICD-10-CM

## 2017-10-09 DIAGNOSIS — E785 Hyperlipidemia, unspecified: Secondary | ICD-10-CM | POA: Diagnosis not present

## 2017-10-09 DIAGNOSIS — E039 Hypothyroidism, unspecified: Secondary | ICD-10-CM

## 2017-10-09 DIAGNOSIS — R197 Diarrhea, unspecified: Secondary | ICD-10-CM | POA: Diagnosis not present

## 2017-10-09 DIAGNOSIS — I10 Essential (primary) hypertension: Secondary | ICD-10-CM | POA: Diagnosis not present

## 2017-10-09 MED ORDER — BUDESONIDE-FORMOTEROL FUMARATE 80-4.5 MCG/ACT IN AERO: 2.0000 | INHALATION_SPRAY | Freq: Two times a day (BID) | RESPIRATORY_TRACT | 2 refills | Status: DC

## 2017-10-09 MED ORDER — ALBUTEROL SULFATE HFA 108 (90 BASE) MCG/ACT IN AERS: 2.0000 | INHALATION_SPRAY | Freq: Four times a day (QID) | RESPIRATORY_TRACT | 2 refills | Status: DC | PRN

## 2017-10-09 NOTE — Assessment & Plan Note (Signed)
Stable  Continue current regimen  

## 2017-10-09 NOTE — Progress Notes (Signed)
Subjective:     Patient ID: Jill [name], female   DOB: 07/11/1951, 66 y.o.   MRN: [id number]  HPI URI/COPD: C/U runny nose and cough for the last few days associated with phlegm from her throat. Her grandson had URI few days prior. She gets hot and sweaty sometimes but no fever. Denies SOB, except when she wakes in the morning. She has been unable to afford her COPD medication, so she is not taking it. Hypothyroidism:Currently on Synthroid 150 mcg qd. She need refill. Claimed good compliance with med. HTN/HLD:Compliant with her meds, here for follow-up. Diarrhea: Diarrhea started yesterday, stool was watery yesterday but now soft stool. No blood. Husband has diarrhea too. No belly pain, no blood in stool. Feeling better. No N/V.  Current Outpatient Medications on File Prior to Visit  Medication Sig Dispense Refill  . amLODipine (NORVASC) 10 MG tablet Take 1 tablet (10 mg total) by mouth daily. 90 tablet 1  . aspirin EC 81 MG tablet Take 81 mg by mouth daily.    Marland Kitchen atorvastatin (LIPITOR) 20 MG tablet Take 1 tablet (20 mg total) by mouth daily. 90 tablet 1  . fluticasone (FLOVENT HFA) 110 MCG/ACT inhaler Inhale 2 puffs into the lungs 2 (two) times daily. 1 Inhaler 12  . levothyroxine (SYNTHROID, LEVOTHROID) 175 MCG tablet Take 1 tablet (175 mcg total) by mouth daily. 90 tablet 1  . losartan (COZAAR) 100 MG tablet Take 1 tablet (100 mg total) by mouth daily. 90 tablet 1  . metoprolol succinate (TOPROL-XL) 50 MG 24 hr tablet Take 1 tablet (50 mg total) by mouth daily. Take with or immediately following a meal. 90 tablet 3  . nitroGLYCERIN (NITROSTAT) 0.4 MG SL tablet Place 1 tablet (0.4 mg total) under the tongue every 5 (five) minutes as needed. For chest pain (Patient not taking: Reported on 09/01/2016) 25 tablet 6  . Omega-3 Fatty Acids (FISH OIL) 1000 MG CAPS Take 2 capsules by mouth.    Marland Kitchen omeprazole (PRILOSEC) 20 MG capsule Take 1 capsule (20 mg total) by mouth daily. Prolonged use may damage  the kidney 30 capsule 1  . PARoxetine (PAXIL) 40 MG tablet Take 1 tablet (40 mg total) by mouth daily. 90 tablet 1   No current facility-administered medications on file prior to visit.    Past Medical History:  Diagnosis Date  . Anxiety    ON PAXIL, XANAX  . AVM (arteriovenous malformation) brain    s/p stent/coil  . Blood transfusion   . Coronary artery disease    Prior inferior MI with stent to RCA, s/p CABG in 2008  . Dizziness   . Fatigue   . Foot injury 06/01/2017  . GERD (gastroesophageal reflux disease)   . Headache(784.0)    UNRUPTURED CEREBRAL ANEURYSM  . Hyperlipidemia   . Hypertension   . Hypothyroidism   . Infected cyst of skin 09/18/2013  . Myocardial infarction ([place])   . Normal nuclear stress test Ju;y 2012   No ischemia. EF 70%; fixed defect involving septum, inferoseptal and inferior wall  . Retroperitoneal bleeding    Following cardiac cath  . Tobacco abuse   . Urine discoloration 09/18/2013  . UTI (lower urinary tract infection) 10/16/2013      Review of Systems  Constitutional: Negative for fever.  HENT: Positive for postnasal drip. Negative for ear pain, sinus pressure and sinus pain.   Respiratory: Positive for cough. Negative for wheezing.   Cardiovascular: Negative.   Gastrointestinal: Positive for diarrhea. Negative for  abdominal pain.  Neurological: Negative.   All other systems reviewed and are negative.      Objective:   Physical Exam  Constitutional: She is oriented to person, place, and time. She appears well-developed. No distress.  HENT:  Head: Normocephalic.  Right Ear: Tympanic membrane, external ear and ear canal normal.  Left Ear: Tympanic membrane, external ear and ear canal normal.  Nose: Right sinus exhibits no maxillary sinus tenderness and no frontal sinus tenderness. Left sinus exhibits no maxillary sinus tenderness and no frontal sinus tenderness.  Mouth/Throat: Oropharynx is clear and moist and mucous membranes are  normal.  Cardiovascular: Normal rate, regular rhythm and normal heart sounds.  No murmur heard. Pulmonary/Chest: Effort normal and breath sounds normal. No respiratory distress. She has no wheezes. She has no rales.  Abdominal: Soft. Bowel sounds are normal. She exhibits no distension and no mass. There is no tenderness.  Musculoskeletal: Normal range of motion. She exhibits no edema.  Neurological: She is alert and oriented to person, place, and time.  Psychiatric: She has a normal mood and affect.  Nursing note and vitals reviewed.      Assessment:     COPD/URI/Sinusitis Hypothyroidism HTN HLD Diarrhea    Plan:     Check problem list.

## 2017-10-09 NOTE — Assessment & Plan Note (Signed)
Likely viral infection. Now resolving. Keep self well hydrated. F/U if symptoms worsens or persists.

## 2017-10-09 NOTE — Assessment & Plan Note (Signed)
Compliant with synthroid 150 mcg qd. Although she was supposed to be on 175 mcg. TSH rechecked today. If fine, we will continue current dose. I will contact her with result.

## 2017-10-09 NOTE — Assessment & Plan Note (Signed)
BP looks good. Continue current regimen. We will refill meds when needed.

## 2017-10-09 NOTE — Patient Instructions (Signed)
Sinus Rinse What is a sinus rinse? A sinus rinse is a home treatment. It rinses your sinuses with a mixture of salt and water (saline solution). Sinuses are air-filled spaces in your skull behind the bones of your face and forehead. They open into your nasal cavity. To do a sinus rinse, you will need:  Saline solution.  Neti pot or spray bottle. This releases the saline solution into your nose and through your sinuses. You can buy neti pots and spray bottles at: ? [employer]. ? A health food store. ? Online.  When should I do a sinus rinse? A sinus rinse can help to clear your nasal cavity. It can clear:  Mucus.  Dirt.  Dust.  Pollen.  You may do a sinus rinse when you have:  A cold.  A virus.  Allergies.  A sinus infection.  A stuffy nose.  If you are considering a sinus rinse:  Ask your child's doctor before doing a sinus rinse on your child.  Do not do a sinus rinse if you have had: ? Ear or nasal surgery. ? An ear infection. ? Blocked ears.  How do I do a sinus rinse?  Wash your hands.  Disinfect your device using the directions that came with the device.  Dry your device.  Use the solution that comes with your device or one that is sold separately in stores. Follow the mixing directions on the package.  Fill your device with the amount of saline solution as stated in the device instructions.  Stand over a sink and tilt your head sideways over the sink.  Place the spout of the device in your upper nostril (the one closer to the ceiling).  Gently pour or squeeze the saline solution into the nasal cavity. The liquid should drain to the lower nostril if you are not too congested.  Gently blow your nose. Blowing too hard may cause ear pain.  Repeat in the other nostril.  Clean and rinse your device with clean water.  Air-dry your device. Are there risks of a sinus rinse? Sinus rinse is normally very safe and helpful. However, there are a  few risks, which include:  A burning feeling in the sinuses. This may happen if you do not make the saline solution as instructed. Make sure to follow all directions when making the saline solution.  Infection from unclean water. This is rare, but possible.  Nasal irritation.  This information is not intended to replace advice given to you by your health care provider. Make sure you discuss any questions you have with your health care provider. Document Released: 05/20/2014 Document Revised: 09/19/2016 Document Reviewed: 03/10/2014 Elsevier Interactive Patient Education  2017 [employer].

## 2017-10-09 NOTE — Assessment & Plan Note (Signed)
Unable to afford Fluticasone which after checking is on non-preferred drug list. We have Breo samples however, she might not be able to afford it in the future since it is not on preferred list either. Symbicort is. She prefer trial of symbicort. If too expensive, she will contact me and start Breo instead. We will provide samples as it is available. She will benefit from smoking cessation. Use albuterol as needed. Normal saline nasal rinse for sinus issues. I recommended OTC cough syrup as well. Flu shot not given today since she is since. She will return in about 1 week for her vaccination in addition to pneumovax. F/U as needed.

## 2017-10-10 ENCOUNTER — Telehealth: Payer: Self-pay | Admitting: Family Medicine

## 2017-10-10 DIAGNOSIS — Z1239 Encounter for other screening for malignant neoplasm of breast: Secondary | ICD-10-CM

## 2017-10-10 LAB — BASIC METABOLIC PANEL
BUN / CREAT RATIO: 6 — AB (ref 12–28)
BUN: 7 mg/dL — ABNORMAL LOW (ref 8–27)
CO2: 23 mmol/L (ref 20–29)
Calcium: 9.4 mg/dL (ref 8.7–10.3)
Chloride: 103 mmol/L (ref 96–106)
Creatinine, Ser: 1.12 mg/dL — ABNORMAL HIGH (ref 0.57–1.00)
GFR, EST AFRICAN AMERICAN: 58 mL/min/{1.73_m2} — AB (ref >59)
GFR, EST NON AFRICAN AMERICAN: 51 mL/min/{1.73_m2} — AB (ref >59)
Glucose: 118 mg/dL — ABNORMAL HIGH (ref 65–99)
POTASSIUM: 3.9 mmol/L (ref 3.5–5.2)
SODIUM: 142 mmol/L (ref 134–144)

## 2017-10-10 LAB — TSH: TSH: 2.38 u[IU]/mL (ref 0.450–4.500)

## 2017-10-10 MED ORDER — LEVOTHYROXINE SODIUM 150 MCG PO TABS: 150.0000 ug | ORAL_TABLET | Freq: Every day | ORAL | 1 refills | Status: DC

## 2017-10-10 NOTE — Telephone Encounter (Signed)
Test result discussed with patient.  TSH looks fine. I will refill her synthroid at 150 mcg qd since she had been on this dose.  Mild AKI. Hydration recommended. Plan to recheck at next visit. She agreed with plan.  Schedule mammogram. She mentioned during visit yesterday that they called her from breast imaging center to schedule an appointment but she is yet to do so.  Yesterday I discussed colonoscopy and cologard but she declined both.

## 2017-10-23 ENCOUNTER — Ambulatory Visit: Payer: Medicare Other

## 2018-01-16 ENCOUNTER — Other Ambulatory Visit: Payer: Self-pay | Admitting: Family Medicine

## 2018-01-24 ENCOUNTER — Ambulatory Visit (INDEPENDENT_AMBULATORY_CARE_PROVIDER_SITE_OTHER): Payer: Medicare Other | Admitting: Family Medicine

## 2018-01-24 ENCOUNTER — Encounter: Payer: Self-pay | Admitting: Family Medicine

## 2018-01-24 ENCOUNTER — Other Ambulatory Visit: Payer: Self-pay

## 2018-01-24 DIAGNOSIS — I10 Essential (primary) hypertension: Secondary | ICD-10-CM | POA: Diagnosis not present

## 2018-01-24 DIAGNOSIS — Z23 Encounter for immunization: Secondary | ICD-10-CM

## 2018-01-24 MED ORDER — OMEPRAZOLE 20 MG PO CPDR: 20.0000 mg | DELAYED_RELEASE_CAPSULE | Freq: Every day | ORAL | 1 refills | Status: DC

## 2018-01-24 MED ORDER — LOSARTAN POTASSIUM 100 MG PO TABS: 100.0000 mg | ORAL_TABLET | Freq: Every day | ORAL | 0 refills | Status: DC

## 2018-01-24 MED ORDER — AMLODIPINE BESYLATE 10 MG PO TABS: 10.0000 mg | ORAL_TABLET | Freq: Every day | ORAL | 0 refills | Status: DC

## 2018-01-24 MED ORDER — METOPROLOL SUCCINATE ER 50 MG PO TB24: 50.0000 mg | ORAL_TABLET | Freq: Every day | ORAL | 3 refills | Status: DC

## 2018-01-24 MED ORDER — PAROXETINE HCL 40 MG PO TABS: 40.0000 mg | ORAL_TABLET | Freq: Every day | ORAL | 0 refills | Status: DC

## 2018-01-24 MED ORDER — ASPIRIN EC 81 MG PO TBEC: 81.0000 mg | DELAYED_RELEASE_TABLET | Freq: Every day | ORAL | 2 refills | Status: DC

## 2018-01-24 MED ORDER — LEVOTHYROXINE SODIUM 150 MCG PO TABS: 150.0000 ug | ORAL_TABLET | Freq: Every day | ORAL | 1 refills | Status: DC

## 2018-01-24 NOTE — Progress Notes (Signed)
   Subjective:    Patient ID: Jill [name], female    DOB: 08/21/1951, 67 y.o.   MRN: [id number]   CC: Medications refill   HPI: Patient is 67 yo female with a past medical history significant for HTN, CAD, COPD, GERD, hypothyroidism, who presents today for medication refill. No acute complaints.   Smoking status reviewed   ROS: all other systems were reviewed and are negative other than in the HPI   Past Medical History:  Diagnosis Date  . Anxiety    ON PAXIL, XANAX  . AVM (arteriovenous malformation) brain    s/p stent/coil  . Blood transfusion   . Coronary artery disease    Prior inferior MI with stent to RCA, s/p CABG in 2008  . Dizziness   . Fatigue   . Foot injury 06/01/2017  . GERD (gastroesophageal reflux disease)   . Headache(784.0)    UNRUPTURED CEREBRAL ANEURYSM  . Hyperlipidemia   . Hypertension   . Hypothyroidism   . Infected cyst of skin 09/18/2013  . Myocardial infarction ([place])   . Normal nuclear stress test Ju;y 2012   No ischemia. EF 70%; fixed defect involving septum, inferoseptal and inferior wall  . Retroperitoneal bleeding    Following cardiac cath  . Tobacco abuse   . Urine discoloration 09/18/2013  . UTI (lower urinary tract infection) 10/16/2013    Past Surgical History:  Procedure Laterality Date  . CARDIAC CATHETERIZATION  01/02/2007   IT REVEALS MILD INFERIOR WALL HYPOKINESIS. THE EJECTION FRACTION IS AROUND 50%  . CORONARY ARTERY BYPASS GRAFT  2008   LIMA to LAD, SVG to DX, SVG to LCX & SVG to OM 1 & 2, and SVG to PD  . CORONARY STENT PLACEMENT     Remote past stent to RCA  . [place]  . VENTRICULOSTOMY  10/06/2011   Procedure: VENTRICULOSTOMY;  Surgeon: Winfield Cunas;  Location: [facility];  Service: Neurosurgery;  Laterality: Right;  Insertion of Ventriculostomy Catheter    Past medical history, surgical, family, and social history reviewed and updated in the EMR as appropriate.  Objective:  BP 118/60   Pulse  68   Temp 98.9 F (37.2 C) (Oral)   Wt 159 lb (72.1 kg)   SpO2 92%   BMI 24.90 kg/m   Vitals and nursing note reviewed  General: NAD, pleasant, able to participate in exam Cardiac: RRR, normal heart sounds, no murmurs. 2+ radial and PT pulses bilaterally Respiratory: CTAB, normal effort, No wheezes, rales or rhonchi Abdomen: soft, nontender, nondistended, no hepatic or splenomegaly, +BS Extremities: no edema or cyanosis. WWP. Skin: warm and dry, no rashes noted Neuro: alert and oriented x4, no focal deficits Psych: Normal affect and mood   Assessment & Plan:   #Medication refill All medications refilled as requested by patient for 3 months until PCP returns. Will follow up with PCP in three months as needed.  Patient will receive PNA vaccine today.    Marjie Skiff, MD [facility] PGY-2

## 2018-03-19 ENCOUNTER — Encounter: Payer: Self-pay | Admitting: Family Medicine

## 2018-03-19 ENCOUNTER — Ambulatory Visit (INDEPENDENT_AMBULATORY_CARE_PROVIDER_SITE_OTHER): Payer: Medicare Other | Admitting: Family Medicine

## 2018-03-19 ENCOUNTER — Other Ambulatory Visit: Payer: Self-pay

## 2018-03-19 VITALS — BP 118/62 | HR 62 | Temp 98.5°F | Ht 67.0 in | Wt 157.0 lb

## 2018-03-19 DIAGNOSIS — Z1159 Encounter for screening for other viral diseases: Secondary | ICD-10-CM | POA: Diagnosis not present

## 2018-03-19 DIAGNOSIS — E2839 Other primary ovarian failure: Secondary | ICD-10-CM | POA: Diagnosis not present

## 2018-03-19 DIAGNOSIS — Q282 Arteriovenous malformation of cerebral vessels: Secondary | ICD-10-CM

## 2018-03-19 DIAGNOSIS — E785 Hyperlipidemia, unspecified: Secondary | ICD-10-CM | POA: Diagnosis not present

## 2018-03-19 DIAGNOSIS — K089 Disorder of teeth and supporting structures, unspecified: Secondary | ICD-10-CM | POA: Diagnosis not present

## 2018-03-19 DIAGNOSIS — Z8619 Personal history of other infectious and parasitic diseases: Secondary | ICD-10-CM | POA: Insufficient documentation

## 2018-03-19 DIAGNOSIS — I10 Essential (primary) hypertension: Secondary | ICD-10-CM

## 2018-03-19 DIAGNOSIS — Z1239 Encounter for other screening for malignant neoplasm of breast: Secondary | ICD-10-CM

## 2018-03-19 DIAGNOSIS — Z1231 Encounter for screening mammogram for malignant neoplasm of breast: Secondary | ICD-10-CM | POA: Diagnosis not present

## 2018-03-19 DIAGNOSIS — K047 Periapical abscess without sinus: Secondary | ICD-10-CM | POA: Insufficient documentation

## 2018-03-19 DIAGNOSIS — K029 Dental caries, unspecified: Secondary | ICD-10-CM | POA: Diagnosis not present

## 2018-03-19 MED ORDER — PAROXETINE HCL 40 MG PO TABS: 40.0000 mg | ORAL_TABLET | Freq: Every day | ORAL | 1 refills | Status: DC

## 2018-03-19 MED ORDER — ASPIRIN EC 81 MG PO TBEC: 81.0000 mg | DELAYED_RELEASE_TABLET | Freq: Every day | ORAL | 5 refills | Status: DC

## 2018-03-19 MED ORDER — ATORVASTATIN CALCIUM 20 MG PO TABS: 20.0000 mg | ORAL_TABLET | Freq: Every day | ORAL | 1 refills | Status: DC

## 2018-03-19 MED ORDER — AMLODIPINE BESYLATE 10 MG PO TABS: 10.0000 mg | ORAL_TABLET | Freq: Every day | ORAL | 1 refills | Status: DC

## 2018-03-19 NOTE — Patient Instructions (Signed)
Measles, Adult Measles is a contagious respiratory illness that causes a red rash to appear on the skin. What are the causes? This condition is caused by a virus called rubeola. It can spread from one person to another through droplets released into the air when a person with the condition talks, coughs, or sneezes. You can get this condition by breathing in these droplets or by touching a surface where the infected droplets fell and then touching your mouth or nose. Infected air droplets are contagious for two hours. What increases the risk? Measles is more common in children than adults, but adults can get it if they have not been vaccinated or have never had the illness. What are the signs or symptoms? Symptoms of this condition include:  Fever.  White spots inside the mouth ([place] spots).  Red, runny eyes (conjunctivitis) that might be extra sensitive to bright light.  Sneezing or coughing.  A sore throat.  A red rash that starts on the face and spreads to the body.  Symptoms usually begin 8-10 days after coming into contact with the virus. The rash is the last symptom to develop and lasts 3-5 days. In rare cases, there is no rash. How is this diagnosed? This condition may be diagnosed based on your symptoms and a physical exam. Sometimes blood tests area also done. How is this treated? This condition goes away on its own, usually within two weeks of symptoms starting. Treatment aims to relieve symptoms and prevent complications from happening. Treatment may include:  Rest.  Medicines.  Using a humidifier.  Follow these instructions at home:  Rest.  Drink enough fluid to keep your urine clear or pale yellow.  Keep the lights low if bright lights bother you.  Keep a humidifier in your room, if possible. This can help relieve your cough.  Take over-the-counter and prescription medicines only as told by your health care provider.  Keep all follow-up visits as told by  your health care provider. This is important.  Stay away from others until four days after the rash appears. This helps prevent others from getting measles.  Be aware that measles cases are often reported to a public health agency. You may be contacted by a public health department and asked questions about how you got infected. How is this prevented? Measles can be prevented with a vaccine. If you have had measles, you cannot get it again and do not need a vaccine. If you are exposed to measles and did not receive a vaccine or have not had measles, you may be able to get a vaccine or an antibody shot within six days of exposure to prevent infection. Get help right away if:  You have ear pain or a headache.  You are breathing rapidly or strangely.  You have chest pain.  You have shortness of breath.  You are confused.  You have a seizure.  You feel nauseous or you vomit.  Your measles symptoms do not go away in two weeks.  You have symptoms of another illness. This information is not intended to replace advice given to you by your health care provider. Make sure you discuss any questions you have with your health care provider. Document Released: 07/17/2012 Document Revised: 03/30/2016 Document Reviewed: 10/17/2015 Elsevier Interactive Patient Education  [employer].

## 2018-03-19 NOTE — Progress Notes (Signed)
Subjective:     Patient ID: Jill [name], female   DOB: Sep 18, 1951, 67 y.o.   MRN: [id number]  HPI Dental Problem:Need referral to [facility] Dentist. Brain aneurysm:Denies any symptoms. She will like to know if she needed f/u imaging. HM: Need follow-up. She also mentioned she was asked to get measle vaccination. She has never been vaccinated due to previous measle infection.  Current Outpatient Medications on File Prior to Visit  Medication Sig Dispense Refill  . amLODipine (NORVASC) 10 MG tablet Take 1 tablet (10 mg total) by mouth daily. 90 tablet 0  . aspirin EC 81 MG tablet Take 1 tablet (81 mg total) by mouth daily. 30 tablet 2  . atorvastatin (LIPITOR) 20 MG tablet Take 1 tablet (20 mg total) by mouth daily. 90 tablet 1  . Cholecalciferol (VITAMIN D3) 25 MCG TABS Take 25 mcg by mouth.    . levothyroxine (SYNTHROID, LEVOTHROID) 150 MCG tablet Take 1 tablet (150 mcg total) by mouth daily. 90 tablet 1  . losartan (COZAAR) 100 MG tablet Take 1 tablet (100 mg total) by mouth daily. 90 tablet 0  . metoprolol succinate (TOPROL-XL) 50 MG 24 hr tablet Take 1 tablet (50 mg total) by mouth daily. Take with or immediately following a meal. 90 tablet 3  . nitroGLYCERIN (NITROSTAT) 0.4 MG SL tablet Place 1 tablet (0.4 mg total) under the tongue every 5 (five) minutes as needed. For chest pain 25 tablet 6  . Omega-3 Fatty Acids (FISH OIL) 1000 MG CAPS Take 2 capsules by mouth.    Marland Kitchen omeprazole (PRILOSEC) 20 MG capsule Take 1 capsule (20 mg total) by mouth daily. Prolonged use may damage the kidney 30 capsule 1  . PARoxetine (PAXIL) 40 MG tablet Take 1 tablet (40 mg total) by mouth daily. 90 tablet 0  . albuterol (PROVENTIL HFA;VENTOLIN HFA) 108 (90 Base) MCG/ACT inhaler Inhale 2 puffs into the lungs every 6 (six) hours as needed for wheezing or shortness of breath. (Patient not taking: Reported on 03/19/2018) 1 Inhaler 2  . budesonide-formoterol (SYMBICORT) 80-4.5 MCG/ACT inhaler Inhale 2 puffs into the  lungs 2 (two) times daily. (Patient not taking: Reported on 03/19/2018) 1 Inhaler 2   No current facility-administered medications on file prior to visit.    Past Medical History:  Diagnosis Date  . Anxiety    ON PAXIL, XANAX  . AVM (arteriovenous malformation) brain    s/p stent/coil  . Blood transfusion   . Coronary artery disease    Prior inferior MI with stent to RCA, s/p CABG in 2008  . Dizziness   . Fatigue   . Foot injury 06/01/2017  . GERD (gastroesophageal reflux disease)   . Headache(784.0)    UNRUPTURED CEREBRAL ANEURYSM  . Hyperlipidemia   . Hypertension   . Hypothyroidism   . Infected cyst of skin 09/18/2013  . Myocardial infarction ([place])   . Normal nuclear stress test Ju;y 2012   No ischemia. EF 70%; fixed defect involving septum, inferoseptal and inferior wall  . Retroperitoneal bleeding    Following cardiac cath  . Tobacco abuse   . Urine discoloration 09/18/2013  . UTI (lower urinary tract infection) 10/16/2013   Vitals:   03/19/18 1112  BP: 118/62  Pulse: 62  Temp: 98.5 F (36.9 C)  TempSrc: Oral  SpO2: 94%  Weight: 157 lb (71.2 kg)  Height: 5\' 7"  (1.702 m)     Review of Systems  HENT: Positive for dental problem.   Respiratory: Negative.   Cardiovascular:  Negative.   Gastrointestinal: Negative.   Genitourinary: Negative.   Neurological: Negative.   All other systems reviewed and are negative.      Objective:   Physical Exam  Constitutional: She is oriented to person, place, and time. She appears well-developed. No distress.  HENT:  Mouth/Throat:    Cardiovascular: Normal rate and regular rhythm.  No murmur heard. Pulmonary/Chest: Effort normal and breath sounds normal. No stridor. No respiratory distress.  Abdominal: Soft. Bowel sounds are normal. She exhibits no distension. There is no tenderness.  Musculoskeletal: Normal range of motion. She exhibits no edema.  Neurological: She is alert and oriented to person, place, and time. No  cranial nerve deficit. Coordination normal.  Nursing note and vitals reviewed.      Assessment:     Dental [place] Brain aneurysm Health maintenance    Plan:     Check problem list.  I again discussed mammogram, colonoscopy and Dexa scan. She is willing to do Dexa and mammogram but not colonoscopy.

## 2018-03-19 NOTE — Assessment & Plan Note (Signed)
Igg Rubeola checked. If highly positive she will not need to be revaccinated.

## 2018-03-19 NOTE — Assessment & Plan Note (Signed)
Currently asymptomatic. I mentioned she could contact her neurologist while I look into this for her. I later called to discuss recommendation with her but she was unavailable. She will call back per her husband.  Plan is for her to f/u with Neurologist for management and recommendation.

## 2018-03-19 NOTE — Assessment & Plan Note (Signed)
Referral to [facility] dentist done. Continue routine dental hygiene.

## 2018-03-20 LAB — RUBEOLA ANTIBODY IGG: RUBEOLA AB, IGG: 38.5 [AU]/ml (ref >29.9)

## 2018-06-11 ENCOUNTER — Ambulatory Visit (INDEPENDENT_AMBULATORY_CARE_PROVIDER_SITE_OTHER): Payer: Medicare Other | Admitting: Family Medicine

## 2018-06-11 ENCOUNTER — Other Ambulatory Visit: Payer: Self-pay

## 2018-06-11 ENCOUNTER — Ambulatory Visit (HOSPITAL_COMMUNITY)
Admission: RE | Admit: 2018-06-11 | Discharge: 2018-06-11 | Disposition: A | Discharge status: Home / Self Care | Payer: Medicare Other | Source: Ambulatory Visit | Attending: Family Medicine | Admitting: Family Medicine

## 2018-06-11 VITALS — BP 118/70 | HR 54 | Temp 98.7°F | Ht 67.0 in | Wt 154.0 lb

## 2018-06-11 DIAGNOSIS — M170 Bilateral primary osteoarthritis of knee: Secondary | ICD-10-CM | POA: Insufficient documentation

## 2018-06-11 DIAGNOSIS — M25561 Pain in right knee: Secondary | ICD-10-CM | POA: Diagnosis not present

## 2018-06-11 MED ORDER — DICLOFENAC SODIUM 1 % TD GEL: 4.0000 g | Freq: Four times a day (QID) | TRANSDERMAL | 0 refills | Status: DC

## 2018-06-11 NOTE — Patient Instructions (Signed)
Thank you for coming in to see [place] today. Please see below to review our plan for today's visit.  Please go to [facility] radiology department to receive your x-rays.  I will call you regarding the results.  In the meantime, apply the Voltaren gel I prescribed you for times daily as needed for your knee pain.    Please call the clinic at [phone] if your symptoms worsen or you have any concerns. It was our pleasure to serve you.  Harriet Butte, [place], PGY-3

## 2018-06-11 NOTE — Progress Notes (Signed)
   Subjective   Patient ID: Jill [name]    DOB: 1951/01/15, 67 y.o. female   MRN: [id number]  CC: "Gout on right knee"  HPI: Jill [name] is a 67 y.o. female who presents to clinic today for the following:  Right knee pain: Patient presents today for acute right knee pain that began suddenly approximately 2 weeks ago when she was sleeping.  She did not recall trauma or injury to the knee or a popping sensation during the event.  Patient denies prior history of right knee pain but has long history of left knee pain.  She thinks she may have "slept on it" which may have resulted in the knee pain.  Pain seems to be burning in sensation and worse with use over the day and improves with rest.  She has used Tylenol only with some improvement.  She denies history of swelling or erythema over the knee.  She does not endorse catching incision on her knee.  She is worried she may have gout.  ROS: see HPI for pertinent.  [place]: COPD, brain AVM, CAD, GERD, HTN, hypothyroidism, left knee pain, prediabetes, tobacco use disorder.  Surgical history ventriculostomy, hemorrhoids, stent, bypass (2008).  Family history diabetes, asthma, cancer.  Smoking status reviewed. Medications reviewed.  Objective   BP 118/70   Pulse (!) 54   Temp 98.7 F (37.1 C) (Oral)   Ht 5\' 7"  (1.702 m)   Wt 154 lb (69.9 kg)   SpO2 94%   BMI 24.12 kg/m  Vitals and nursing note reviewed.  General: well nourished, well developed, NAD with non-toxic appearance HEENT: normocephalic, atraumatic, moist mucous membranes Cardiovascular: regular rate and rhythm without murmurs, rubs, or gallops Lungs: clear to auscultation bilaterally with normal work of breathing Skin: warm, dry, no rashes or lesions, cap refill < 2 seconds Extremities: warm and well perfused, normal tone, no edema, negative Lockman's and posterior drawer test bilaterally, negative varus and valgus stress to knees bilaterally, negative McMurray's test bilaterally,  there is minimal tenderness to palpation of posterior knee, calf symmetrical without calor, range of motion restricted with flexion at right knee at approximately 120 degrees  Assessment & Plan   Acute pain of right knee Appears to be more posterior.  No signs of septic knee or gout.  There may be an arthritic component.  Will need to assess with imaging.  No history or clinical findings concerning for ligamental or meniscal injury. - Checking bilateral standing knee radiographs - Given prescription for Voltaren 1% gel with instructions to use 4 times daily as needed - Reviewed return precautions and directions to follow-up with PCP  Orders Placed This Encounter  Procedures  . DG Knee Bilateral Standing AP    Standing Status:   Future    Number of Occurrences:   1    Standing Expiration Date:   12/08/2018    Order Specific Question:   Reason for Exam (SYMPTOM  OR DIAGNOSIS REQUIRED)    Answer:   knee pain    Order Specific Question:   Preferred imaging location?    Answer:   [facility]   Meds ordered this encounter  Medications  . diclofenac sodium (VOLTAREN) 1 % GEL    Sig: Apply 4 g topically 4 (four) times daily.    Dispense:  1 Tube    Refill:  0    Harriet Butte, [place], PGY-3 06/11/2018, 5:37 PM

## 2018-06-11 NOTE — Assessment & Plan Note (Addendum)
Appears to be more posterior.  No signs of septic knee or gout.  There may be an arthritic component.  Will need to assess with imaging.  No history or clinical findings concerning for ligamental or meniscal injury. - Checking bilateral standing knee radiographs - Given prescription for Voltaren 1% gel with instructions to use 4 times daily as needed - Reviewed return precautions and directions to follow-up with PCP

## 2018-06-13 ENCOUNTER — Encounter: Payer: Self-pay | Admitting: Family Medicine

## 2018-06-13 ENCOUNTER — Telehealth: Payer: Self-pay | Admitting: Family Medicine

## 2018-06-13 NOTE — Telephone Encounter (Signed)
Patient left message stating she was returning a call.   [place], RN [facility] RN)

## 2018-06-13 NOTE — Telephone Encounter (Signed)
Contacted patient regarding recent visit for right knee pain on 06/11/2018.  No answer, left VM. Standing bilateral knee x-ray shows moderate degenerative joint disease on left and mild degenerative joint disease on the right particularly at the medial surface.  There is no acute abnormality.  Patient will need to return to clinic if right knee pain persists for hospital consideration for MRI.  Harriet Butte, [place], PGY-3

## 2018-06-14 ENCOUNTER — Other Ambulatory Visit: Payer: Self-pay | Admitting: *Deleted

## 2018-06-14 MED ORDER — LOSARTAN POTASSIUM 100 MG PO TABS: 100.0000 mg | ORAL_TABLET | Freq: Every day | ORAL | 0 refills | Status: DC

## 2018-06-17 NOTE — Telephone Encounter (Signed)
Returned call, no answer.

## 2018-06-18 ENCOUNTER — Telehealth: Payer: Self-pay

## 2018-06-18 ENCOUNTER — Other Ambulatory Visit: Payer: Self-pay

## 2018-06-18 NOTE — Telephone Encounter (Signed)
Refill is not appropriate. Does not need to be on A/B chronically otherwise may increase the risk of A/B resistant. Please advise her to schedule f/u with her dentist soon or come in to be seen. She might need drainage for tooth abscess.

## 2018-06-18 NOTE — Telephone Encounter (Signed)
Patient left message requesting xray results. Left a different number than is in her chart.  Call back is [phone]  Danley Danker, RN [facility] [place])

## 2018-06-18 NOTE — Telephone Encounter (Signed)
Call made.   Harriet Butte, [place], PGY-3

## 2018-06-18 NOTE — Telephone Encounter (Signed)
Patient calling for refill of Erythromycin for a tooth abscess.  Left call back number of [phone]  Danley Danker, RN [facility] RN)

## 2018-06-19 ENCOUNTER — Encounter (HOSPITAL_COMMUNITY): Payer: Self-pay | Admitting: Emergency Medicine

## 2018-06-19 ENCOUNTER — Ambulatory Visit (HOSPITAL_COMMUNITY)
Admission: EM | Admit: 2018-06-19 | Discharge: 2018-06-19 | Disposition: A | Discharge status: Home / Self Care | Payer: Medicare Other | Attending: Family Medicine | Admitting: Family Medicine

## 2018-06-19 ENCOUNTER — Other Ambulatory Visit: Payer: Self-pay

## 2018-06-19 DIAGNOSIS — K1121 Acute sialoadenitis: Secondary | ICD-10-CM

## 2018-06-19 MED ORDER — HYDROCODONE-ACETAMINOPHEN 5-325 MG PO TABS: 1.0000 | ORAL_TABLET | Freq: Four times a day (QID) | ORAL | 0 refills | Status: DC | PRN

## 2018-06-19 MED ORDER — KETOROLAC TROMETHAMINE 30 MG/ML IJ SOLN
30.0000 mg | Freq: Once | INTRAMUSCULAR | Status: AC
  Administered 2018-06-19: 30 mg via INTRAMUSCULAR

## 2018-06-19 MED ORDER — KETOROLAC TROMETHAMINE 30 MG/ML IJ SOLN
INTRAMUSCULAR | Status: AC
  Filled 2018-06-19: qty 1

## 2018-06-19 MED ORDER — CEFDINIR 300 MG PO CAPS: 300.0000 mg | ORAL_CAPSULE | Freq: Two times a day (BID) | ORAL | 0 refills | Status: DC

## 2018-06-19 NOTE — ED Provider Notes (Signed)
Jill [name]   [id number] 06/19/18 Arrival Time: 8413  ASSESSMENT & PLAN:  1. Acute parotitis   R-sided  Meds ordered this encounter  Medications  . ketorolac (TORADOL) 30 MG/ML injection 30 mg  . HYDROcodone-acetaminophen (NORCO/VICODIN) 5-325 MG tablet    Sig: Take 1 tablet by mouth every 6 (six) hours as needed for moderate pain or severe pain.    Dispense:  8 tablet    Refill:  0  . cefdinir (OMNICEF) 300 MG capsule    Sig: Take 1 capsule (300 mg total) by mouth 2 (two) times daily.    Dispense:  20 capsule    Refill:  0   Follow-up Information    Jill Feil, MD.   Specialty:  Family Medicine Why:  As needed. Contact information: [place] [address] [address].   Specialty:  Urgent Care Why:  If symptoms worsen. Contact information: [place] [phone]          [place] Controlled Substances Registry consulted for this patient. I feel the risk/benefit ratio today is favorable for proceeding with this prescription for a controlled substance. Medication sedation precautions given.  Dental resource written instructions given. She will schedule dental evaluation as soon as possible.  Reviewed expectations re: course of current medical issues. Questions answered. Outlined signs and symptoms indicating need for more acute intervention. Patient verbalized understanding. After Visit Summary given.   SUBJECTIVE:  Jill [name] is a 67 y.o. female who reports fairly abrupt onset of right lower dental/cheek pain described as aching. Present for approximately 2 days. Afebrile. Tolerating PO intake but reports pain with chewing. Normal swallowing. She does not see a dentist regularly. No neck swelling or pain. OTC analgesics without relief.  ROS: As per HPI.  OBJECTIVE:  Vitals:   06/19/18 1315  BP: 133/75  Pulse: 75  Resp:  18  Temp: 98.4 F (36.9 C)  TempSrc: Oral  SpO2: 100%    General appearance: alert; no distress HENT: normocephalic; atraumatic; dentition: fair; no gingival hypertropy or erythema over right lower gums; does have significant swelling around parotid gland location without skin changes; very tender to touch; no masses appreciated Neck: supple without LAD Lungs: normal respirations Skin: warm and dry Psychological: alert and cooperative; normal mood and affect  Allergies  Allergen Reactions  . Varenicline Other (See Comments)    Reports change in mood with increased irritability and homicidal ideation.  2007   . Ace Inhibitors Cough  . Prednisone Other (See Comments)    Crying, disorientation - occurred in 1994  . Penicillins Rash  . Sulfa Drugs Cross Reactors Rash    Past Medical History:  Diagnosis Date  . Anxiety    ON PAXIL, XANAX  . AVM (arteriovenous malformation) brain    s/p stent/coil  . Blood transfusion   . Coronary artery disease    Prior inferior MI with stent to RCA, s/p CABG in 2008  . Dizziness   . Fatigue   . Foot injury 06/01/2017  . GERD (gastroesophageal reflux disease)   . Headache(784.0)    UNRUPTURED CEREBRAL ANEURYSM  . Hyperlipidemia   . Hypertension   . Hypothyroidism   . Infected cyst of skin 09/18/2013  . Myocardial infarction ([place])   . Normal nuclear stress test Ju;y 2012   No ischemia. EF 70%; fixed defect involving septum, inferoseptal and inferior wall  .  Retroperitoneal bleeding    Following cardiac cath  . Tobacco abuse   . Urine discoloration 09/18/2013  . UTI (lower urinary tract infection) 10/16/2013   Social History   Socioeconomic History  . Marital status: Married    Spouse name: Not on file  . Number of children: Not on file  . Years of education: Not on file  . Highest education level: Not on file  Occupational History  . Not on file  Social Needs  . Financial resource strain: Not on file  . Food insecurity:     Worry: Not on file    Inability: Not on file  . Transportation needs:    Medical: Not on file    Non-medical: Not on file  Tobacco Use  . Smoking status: Current Every Day Smoker    Packs/day: 1.50    Years: 58.00    Pack years: 87.00    Types: Cigarettes    Start date: 11/06/1957  . Smokeless tobacco: Never Used  . Tobacco comment: Has smoked 3 ppd, current 1.5-2 ppd  Substance and Sexual Activity  . Alcohol use: No  . Drug use: No  . Sexual activity: Yes    Birth control/protection: Post-menopausal  Lifestyle  . Physical activity:    Days per week: Not on file    Minutes per session: Not on file  . Stress: Not on file  Relationships  . Social connections:    Talks on phone: Not on file    Gets together: Not on file    Attends religious service: Not on file    Active member of club or organization: Not on file    Attends meetings of clubs or organizations: Not on file    Relationship status: Not on file  . Intimate partner violence:    Fear of current or ex partner: Not on file    Emotionally abused: Not on file    Physically abused: Not on file    Forced sexual activity: Not on file  Other Topics Concern  . Not on file  Social History Writer.  Some college.  Currently retired.  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 worked in [employer] most recently.      Lives with husband and 2 adult daughters and 4 grandchildren.   Family History  Problem Relation Age of Onset  . Cancer Mother 23  . Diabetes Mother   . Alcohol abuse Father   . Asthma Father   . Diabetes Brother   . Lung cancer Brother   . Ovarian cancer Maternal Aunt   . Liver cancer Paternal Aunt   . Heart disease Neg Hx    Past Surgical History:  Procedure Laterality Date  . CARDIAC CATHETERIZATION  01/02/2007   IT REVEALS MILD INFERIOR WALL HYPOKINESIS. THE EJECTION FRACTION IS AROUND 50%  . CORONARY ARTERY BYPASS GRAFT  2008   LIMA to LAD, SVG to DX, SVG to LCX &  SVG to OM 1 & 2, and SVG to PD  . CORONARY STENT PLACEMENT     Remote past stent to RCA  . [place]  . VENTRICULOSTOMY  10/06/2011   Procedure: VENTRICULOSTOMY;  Surgeon: Winfield Cunas;  Location: [place];  Service: Neurosurgery;  Laterality: Right;  Insertion of Ventriculostomy Catheter     Vanessa Kick, MD 06/22/18 1014

## 2018-06-19 NOTE — Telephone Encounter (Signed)
Attempted to call patient but VM was full and I was unable to leave message.  Will continue to try and reach her.  Jazmin Hartsell,CMA

## 2018-06-19 NOTE — Telephone Encounter (Signed)
Will forward to MD. Daemian Gahm,CMA  

## 2018-06-19 NOTE — ED Triage Notes (Signed)
Pt has a right lower dental abscess with swelling to her face that started Tuesday night.

## 2018-06-19 NOTE — Telephone Encounter (Signed)
Patient left message stating she has an appt with [facility] on 07/09/18 but needs antibiotic until then for an abscess. Also stated PCP told her that she could call when she needs this.   Did not leave call back number.   Danley Danker, RN [facility] RN)

## 2018-06-19 NOTE — Discharge Instructions (Signed)
Be aware, pain medications may cause drowsiness. Please do not drive, operate heavy machinery or make important decisions while on this medication, it can cloud your judgement.

## 2018-06-19 NOTE — Telephone Encounter (Signed)
I don't think I will tell her to call whenever she needs antibiotic. We need to reevaluate her if she is still having symptoms. It is not a good care to treat infection over the telephone. Please advise patient.

## 2018-06-21 NOTE — Telephone Encounter (Signed)
Patient states that she went to urgent care on Wednesday and was treated for an infected saliva gland.  Jazmin Hartsell,CMA

## 2018-06-25 ENCOUNTER — Ambulatory Visit (INDEPENDENT_AMBULATORY_CARE_PROVIDER_SITE_OTHER): Payer: Medicare Other | Admitting: Family Medicine

## 2018-06-25 ENCOUNTER — Telehealth: Payer: Self-pay

## 2018-06-25 ENCOUNTER — Other Ambulatory Visit: Payer: Self-pay

## 2018-06-25 ENCOUNTER — Other Ambulatory Visit: Payer: Self-pay | Admitting: Family Medicine

## 2018-06-25 VITALS — BP 118/78 | HR 69 | Temp 98.2°F | Wt 155.0 lb

## 2018-06-25 DIAGNOSIS — K047 Periapical abscess without sinus: Secondary | ICD-10-CM

## 2018-06-25 MED ORDER — AMOXICILLIN-POT CLAVULANATE 875-125 MG PO TABS: 1.0000 | ORAL_TABLET | Freq: Two times a day (BID) | ORAL | 0 refills | Status: DC

## 2018-06-25 MED ORDER — HYDROCODONE-ACETAMINOPHEN 5-325 MG PO TABS: 1.0000 | ORAL_TABLET | Freq: Four times a day (QID) | ORAL | 0 refills | Status: DC | PRN

## 2018-06-25 NOTE — Telephone Encounter (Signed)
[facility] left message on nurse line that they cannot accept the rx for hydrocodone sent in by Dr. Andy Gauss- that they need new rx sent in by supervising MD. Needs new rx e-scribed to [facility] Wallace Cullens, RN

## 2018-06-25 NOTE — Telephone Encounter (Signed)
Patient informed.  Jill [name],CMA  

## 2018-06-25 NOTE — Telephone Encounter (Signed)
Will forward to Dr. Gwendlyn Deutscher. Jazmin Hartsell,CMA

## 2018-06-25 NOTE — Progress Notes (Signed)
Subjective:    Patient ID: Jill [name], female    DOB: [birth year], 67 y.o.   MRN: [id number]   CC: Dental pain  HPI: Patient is 67 yo female who presents today complaining of dental pain for the past week. Patient was seen at the urgent care last week and was diagnosed with a dental abscess and was started on cefdinir. Patient reports some improvement in the swelling but continue to endorse some pain. Patient has been taking vicodin, aleve and tylenol for pain control with minimal improvement. Patient has not had any appetite. She has a appointment scheduled with [facility] dental clinic in early September. She currently denies any fever, chills, abdominal pain, nausea or vomiting.  Smoking status reviewed   ROS: all other systems were reviewed and are negative other than in the HPI   Past Medical History:  Diagnosis Date  . Anxiety    ON PAXIL, XANAX  . AVM (arteriovenous malformation) brain    s/p stent/coil  . Blood transfusion   . Coronary artery disease    Prior inferior MI with stent to RCA, s/p CABG in 2008  . Dizziness   . Fatigue   . Foot injury 06/01/2017  . GERD (gastroesophageal reflux disease)   . Headache(784.0)    UNRUPTURED CEREBRAL ANEURYSM  . Hyperlipidemia   . Hypertension   . Hypothyroidism   . Infected cyst of skin 09/18/2013  . Myocardial infarction ([place])   . Normal nuclear stress test Ju;y 2012   No ischemia. EF 70%; fixed defect involving septum, inferoseptal and inferior wall  . Retroperitoneal bleeding    Following cardiac cath  . Tobacco abuse   . Urine discoloration 09/18/2013  . UTI (lower urinary tract infection) 10/16/2013    Past Surgical History:  Procedure Laterality Date  . CARDIAC CATHETERIZATION  01/02/2007   IT REVEALS MILD INFERIOR WALL HYPOKINESIS. THE EJECTION FRACTION IS AROUND 50%  . CORONARY ARTERY BYPASS GRAFT  2008   LIMA to LAD, SVG to DX, SVG to LCX & SVG to OM 1 & 2, and SVG to PD  . CORONARY STENT PLACEMENT     Remote past  stent to RCA  . [place]  . VENTRICULOSTOMY  10/06/2011   Procedure: VENTRICULOSTOMY;  Surgeon: Winfield Cunas;  Location: [place] [facility];  Service: Neurosurgery;  Laterality: Right;  Insertion of Ventriculostomy Catheter    Past medical history, surgical, family, and social history reviewed and updated in the EMR as appropriate.  Objective:  BP 118/78   Pulse 69   Temp 98.2 F (36.8 C) (Oral)   Wt 155 lb (70.3 kg)   SpO2 94%   BMI 24.28 kg/m   Vitals and nursing note reviewed  General: NAD, pleasant, able to participate in exam HEENT: Poor dentition, moderate swelling right lower jaw, tender to palpation, oral mucosa without any lesions, drainage. No fluctuance noted. Cardiac: RRR, normal heart sounds, no murmurs. 2+ radial and PT pulses bilaterally Respiratory: CTAB, normal effort, No wheezes, rales or rhonchi Abdomen: soft, nontender, nondistended, no hepatic or splenomegaly, +BS Extremities: no edema or cyanosis. WWP. Skin: warm and dry, no rashes noted Neuro: alert and oriented x4, no focal deficits Psych: Normal affect and mood   Assessment & Plan:   Dental abscess Patient present with dental pain in the setting of very poor dentition. She has significant swelling but improved in the past week. Patient was started on cefdinir a on 06/19/2018 but has minimal improvement. No anaerobic coverage.  I would not consider her as failing outpatient therapy but more as inappropriate coverage. No signs of systemic infection.  --Will prescribe Augmentin bid for 10 days --Refill percocet 5-325 mg ( 8 tabs) for severe , can also used ibuprofen and tylenol as needed --Follow in the next two to three days in clinic if symptoms do not improve as patient might need IV antibiotic and /or I&D.   Marjie Skiff, MD Home PGY-3

## 2018-06-25 NOTE — Patient Instructions (Addendum)
It was great seeing you today! We have addressed the following issues today  1. I send an antibiotic to your pharmacy you will take it for 10 days. 2. I also refill you pain medication you can use tylenol or aleve when you run out. 3. If pain and swelling does not improve please call the clinic for reevaluation. 4. Follow up with [facility] in early September as scheduled.  If we did any lab work today, and the results require attention, either me or my nurse will get in touch with you. If everything is normal, you will get a letter in mail and a message via . If you don't hear from [place] in two weeks, please give [place] a call. Otherwise, we look forward to seeing you again at your next visit. If you have any questions or concerns before then, please call the clinic at [phone].  Please bring all your medications to every doctors visit  Sign up for My Chart to have easy access to your labs results, and communication with your Primary care physician. Please ask Front Desk for some assistance.   Please check-out at the front desk before leaving the clinic.    Take Care,   Dr. Andy Gauss

## 2018-06-25 NOTE — Telephone Encounter (Signed)
Done

## 2018-06-25 NOTE — Assessment & Plan Note (Signed)
Patient present with dental pain in the setting of very poor dentition. She has significant swelling but improved in the past week. Patient was started on cefdinir a on 06/19/2018 but has minimal improvement. No anaerobic coverage. I would not consider her as failing outpatient therapy but more as inappropriate coverage. No signs of systemic infection.  --Will prescribe Augmentin bid for 10 days --Refill percocet 5-325 mg ( 8 tabs) for severe , can also used ibuprofen and tylenol as needed --Follow in the next two to three days in clinic if symptoms do not improve as patient might need IV antibiotic and /or I&D.

## 2018-06-25 NOTE — Telephone Encounter (Signed)
Pt called to check on the status of a new prescription being sent to [facility].

## 2018-07-17 ENCOUNTER — Ambulatory Visit (INDEPENDENT_AMBULATORY_CARE_PROVIDER_SITE_OTHER): Payer: Medicare Other | Admitting: Family Medicine

## 2018-07-17 ENCOUNTER — Encounter: Payer: Self-pay | Admitting: Family Medicine

## 2018-07-17 ENCOUNTER — Other Ambulatory Visit: Payer: Self-pay

## 2018-07-17 VITALS — BP 114/68 | HR 64 | Temp 98.3°F | Ht 67.0 in | Wt 155.0 lb

## 2018-07-17 DIAGNOSIS — M25561 Pain in right knee: Secondary | ICD-10-CM

## 2018-07-17 NOTE — Patient Instructions (Signed)
1. You were seen today for right knee pain.  2. We discussed quadriceps strengthening exercises. The goal is to eventually be able to do 15 repetitions 3 times a day.  3. Use Tylenol regularly for pain and ibuprofen occasionally. Also rest your knees as needed try alternating icing/heating pad for no more than 15 minutes at a time.  4. Please return if you notice increased warmth in that knee that does no resolve, worsening pain, or for any other reason that concerns you.

## 2018-07-17 NOTE — Progress Notes (Signed)
    Subjective:  Jill [name] is a 67 y.o. female who presents to the [facility] today with a chief complaint of right knee pain.   HPI: Patient reports that the knee pain has been present for months and she was seen in the past for it. She reports having an x-ray of both knees done which showed mild/moderate degenerative joint disease in both knees. She says that that she was recently seen for a dental abscess and given antibiotics and pain medications (which she took very few of) and the pain resolved but it has since returned. She reports the pain is worse with activity and denies any trauma to the the knee. She likes to weed her garden and is on her feet a lot of the day. It is relieved by rest. She also reports that she was stung by a bee yesterday and after being stung the knee pain when away. Reports mild occasional swelling around patella which resolves with rest.  Denies calf tenderness, swelling, erythema, SOB. Patient reports she tried voltaren gel on her knees with little relief. Has not tried ice/heat, NSAIDs.   Objective:  Physical Exam: BP 114/68   Pulse 64   Temp 98.3 F (36.8 C) (Oral)   Ht 5\' 7"  (1.702 m)   Wt 155 lb (70.3 kg)   SpO2 94%   BMI 24.28 kg/m   Gen: NAD, resting comfortably CV: RRR with no murmurs appreciated Pulm: NWOB, CTAB GI: Soft, Nontender, Nondistended. MSK: healing abrassion to right knee. No edema noted on exam but patient feels it is a little swollen from baseline. Mild tenderness to palpation on lateral distal femur and proximal lateral fibula. Also mild tenderness to palpation over tibial tuberosity. No crepitus noted, Normal ROM. Negative Lachman test. Negative posterior drawer. Able to ambulate. Pain when squats down and difficulty returning to standing.  Skin: warm, dry Neuro: grossly normal, moves all extremities Psych: Normal affect and thought content   Assessment/Plan:  Jill [name] is a 67 y.o. female who presents to the [facility] today with a  chief complaint of right knee pain. The pain has been reportedly present for months and denies trauma. X-ray showed mild degenerative joint disease. Differential includes worsening osteoarthritis of the knee (most likely), ligament strain of MCL and LCL ( denies trauma) , meniscal damage ( no crepitus or locking noted). No calf tenderness, erythema, swelling noted.   Plan:  -- recommend quadriceps strengthening exercises -- Tylenol regularly for pain and ibuprofen PRN -- Rest knees as needed and try alternating icing/heating pad -- Return if symptoms worsen, notice significant swelling, warmth, becomes febrile, SOB   Abraham Entwistle Gifford Shave, [place] [facility]  07/17/2018 11:59 AM  I was present during key parts of history and physical and agree with above Lind Covert

## 2018-08-20 ENCOUNTER — Encounter: Payer: Self-pay | Admitting: Family Medicine

## 2018-08-21 ENCOUNTER — Encounter: Payer: Self-pay | Admitting: Family Medicine

## 2018-08-21 ENCOUNTER — Other Ambulatory Visit: Payer: Self-pay

## 2018-08-21 ENCOUNTER — Ambulatory Visit (INDEPENDENT_AMBULATORY_CARE_PROVIDER_SITE_OTHER): Payer: Medicare Other | Admitting: Family Medicine

## 2018-08-21 VITALS — BP 102/58 | HR 60 | Temp 98.1°F | Wt 155.0 lb

## 2018-08-21 DIAGNOSIS — Z23 Encounter for immunization: Secondary | ICD-10-CM

## 2018-08-21 DIAGNOSIS — K029 Dental caries, unspecified: Secondary | ICD-10-CM | POA: Diagnosis not present

## 2018-08-21 NOTE — Patient Instructions (Signed)

## 2018-08-21 NOTE — Progress Notes (Signed)
Subjective:     Patient ID: Jill [name], female   DOB: 1951-10-15, 67 y.o.   MRN: [id number]  HPI Dental problem: No pain right now. Was seen at [name] by the dentist in [place] and they scheduled dental procedure/tooth extraction on Friday the 18th. Patient will like to know if she needed antibiotic prophylaxis prior to the procedure. The dentist did not mention this to her, however, her Cardiologist (Dr. Roxy Manns) who did her heart surgery in 2008 told her she will require prophylaxis with any procedure. She had CABG done in 2008 and that is the surgery she talks about.   HISTORY OF:   Prosthetic cardiac valves, including bioprosthetic and homograft valves: No  Previous bacterial endocarditis: No  Complex cyanotic congenital heart disease (e.g., single ventricle states, transposition of the great arteries, tetralogy of Fallot): No  Surgically constructed systemic-pulmonary shunts or conduits: No    Congenital cardiac malformations other than those listed in the high-risk and negligible-risk categories: No  Acquired valvular dysfunction (e.g., rheumatic heart disease): No  Hypertrophic cardiomyopathy: No  Mitral valve prolapse with valvular regurgitation and/or thickened leaflets: No   Previous coronary artery bypass graft surgery: Yes  Mitral valve prolapse without valvular regurgitation: No  Physiologic, functional or innocent heart murmur: No  Previous Kawasaki disease without valvular dysfunction: No  Previous rheumatic fever without valvular dysfunction: No  Cardiac pacemakers (intravascular and epicardial) and implanted defibrillators: No  Current Outpatient Medications on File Prior to Visit  Medication Sig Dispense Refill  . amLODipine (NORVASC) 10 MG tablet Take 1 tablet (10 mg total) by mouth daily. 90 tablet 1  . aspirin EC 81 MG tablet Take 1 tablet (81 mg total) by mouth daily. 30 tablet 5  . atorvastatin (LIPITOR) 20 MG tablet Take 1 tablet (20 mg total) by  mouth daily. 90 tablet 1  . Cholecalciferol (VITAMIN D3) 25 MCG TABS Take 25 mcg by mouth.    . levothyroxine (SYNTHROID, LEVOTHROID) 150 MCG tablet Take 1 tablet (150 mcg total) by mouth daily. 90 tablet 1  . losartan (COZAAR) 100 MG tablet Take 1 tablet (100 mg total) by mouth daily. 90 tablet 0  . metoprolol succinate (TOPROL-XL) 50 MG 24 hr tablet Take 1 tablet (50 mg total) by mouth daily. Take with or immediately following a meal. 90 tablet 3  . Omega-3 Fatty Acids (FISH OIL) 1000 MG CAPS Take 2 capsules by mouth.    Marland Kitchen PARoxetine (PAXIL) 40 MG tablet Take 1 tablet (40 mg total) by mouth daily. 90 tablet 1  . albuterol (PROVENTIL HFA;VENTOLIN HFA) 108 (90 Base) MCG/ACT inhaler Inhale 2 puffs into the lungs every 6 (six) hours as needed for wheezing or shortness of breath. (Patient not taking: Reported on 08/21/2018) 1 Inhaler 2  . budesonide-formoterol (SYMBICORT) 80-4.5 MCG/ACT inhaler Inhale 2 puffs into the lungs 2 (two) times daily. (Patient not taking: Reported on 08/21/2018) 1 Inhaler 2  . diclofenac sodium (VOLTAREN) 1 % GEL Apply 4 g topically 4 (four) times daily. 1 Tube 0  . HYDROcodone-acetaminophen (NORCO/VICODIN) 5-325 MG tablet Take 1 tablet by mouth every 6 (six) hours as needed for moderate pain or severe pain. 10 tablet 0  . nitroGLYCERIN (NITROSTAT) 0.4 MG SL tablet Place 1 tablet (0.4 mg total) under the tongue every 5 (five) minutes as needed. For chest pain (Patient not taking: Reported on 08/21/2018) 25 tablet 6  . omeprazole (PRILOSEC) 20 MG capsule Take 1 capsule (20 mg total) by mouth daily. Prolonged use may  damage the kidney 30 capsule 1   No current facility-administered medications on file prior to visit.    Past Medical History:  Diagnosis Date  . Anxiety    ON PAXIL, XANAX  . AVM (arteriovenous malformation) brain    s/p stent/coil  . Blood transfusion   . Coronary artery disease    Prior inferior MI with stent to RCA, s/p CABG in 2008  . Dizziness   .  Fatigue   . Foot injury 06/01/2017  . GERD (gastroesophageal reflux disease)   . Headache(784.0)    UNRUPTURED CEREBRAL ANEURYSM  . Hyperlipidemia   . Hypertension   . Hypothyroidism   . Infected cyst of skin 09/18/2013  . Myocardial infarction ([place])   . Normal nuclear stress test Ju;y 2012   No ischemia. EF 70%; fixed defect involving septum, inferoseptal and inferior wall  . Pain, dental 02/06/2017  . Retroperitoneal bleeding    Following cardiac cath  . Tobacco abuse   . Urine discoloration 09/18/2013  . UTI (lower urinary tract infection) 10/16/2013   Vitals:   08/21/18 1133  BP: (!) 102/58  Pulse: 60  Temp: 98.1 F (36.7 C)  TempSrc: Oral  SpO2: 95%  Weight: 155 lb (70.3 kg)     Review of Systems  Respiratory: Negative.   Cardiovascular: Negative.   Gastrointestinal: Negative.   Genitourinary: Negative.   Musculoskeletal: Negative.   Neurological: Negative.   All other systems reviewed and are negative.      Objective:   Physical Exam  Constitutional: She is oriented to person, place, and time. She appears well-developed. No distress.  Cardiovascular: Normal rate, regular rhythm and normal heart sounds.  No murmur heard. Pulmonary/Chest: Effort normal and breath sounds normal. No stridor. No respiratory distress. She has no wheezes.  Abdominal: Soft. Bowel sounds are normal. She exhibits no distension and no mass. There is no tenderness.  Musculoskeletal: Normal range of motion. She exhibits no edema.  Neurological: She is alert and oriented to person, place, and time.  Nursing note and vitals reviewed.      Assessment:     Dental Caries requiring extraction    Plan:     Check problem list.

## 2018-08-21 NOTE — Assessment & Plan Note (Signed)
I reviewed her [facility] dental visit note. Multiple tooth extraction recommended. No mention of IE prophylaxis. She had CABG with open heart surgery done in 2008. No documentation about the procedure or recommendation on file. As discussed with her, she is probably at low risk for IE based on her hx. However, I suggested that she contact her cardiology for recommendation regarding A/B prophylaxis prior to surgery. I called her for follow-up to discuss this. She agreed to contact her cardiologist today. F/U as needed.

## 2018-11-15 ENCOUNTER — Other Ambulatory Visit: Payer: Self-pay | Admitting: Family Medicine

## 2018-12-20 ENCOUNTER — Telehealth: Payer: Self-pay | Admitting: Pharmacist

## 2018-12-20 NOTE — Telephone Encounter (Signed)
Called patient regarding COPD medication optimization and to schedule appointment for PFTs. Left HIPAA compliant VM.

## 2019-03-13 ENCOUNTER — Telehealth: Payer: Self-pay | Admitting: *Deleted

## 2019-03-13 ENCOUNTER — Other Ambulatory Visit: Payer: Self-pay

## 2019-03-13 ENCOUNTER — Telehealth (INDEPENDENT_AMBULATORY_CARE_PROVIDER_SITE_OTHER): Payer: Medicare Other | Admitting: Family Medicine

## 2019-03-13 ENCOUNTER — Encounter: Payer: Self-pay | Admitting: Family Medicine

## 2019-03-13 DIAGNOSIS — K047 Periapical abscess without sinus: Secondary | ICD-10-CM

## 2019-03-13 MED ORDER — AMOXICILLIN-POT CLAVULANATE 875-125 MG PO TABS: 1.0000 | ORAL_TABLET | Freq: Two times a day (BID) | ORAL | 0 refills | Status: DC

## 2019-03-13 NOTE — Progress Notes (Signed)
[place] Tele[facility] Visit  Patient consented to have virtual visit. Method of visit: Telephone  Encounter participants: Patient: Jill [name] - located at home Provider: Benay Pike - located at [facility] Others (if applicable): none  Chief Complaint: tooth pain, swelling  HPI: Patient states she developed pain in her jaw yesterday and felt like she had a fever but did not take her temperature.  She says this morning her gums are swollen and had 'pus' coming out of it.  She has had abscesses before and has had a tooth removed but the other infected tooth was not able to be removed and she cannot schedule a dentist appointment in the near future due to coronavirus restrictions. She states she took an antibiotic the last time this happened about a year ago and it fixed her pain and swelling.    ROS: per HPI  Pertinent PMHx: dental caries/abscess  Exam:  Respiratory: speaking full sentences without difficulty. No cough.   Assessment/Plan: augmentin 10 days BID Dental abscess Pt complains of one day of tooth pain, swelling, and purulent discharge.  Unable to visit dentist currently due to covid.  Stated the antibiotic she was prescribed the last time this happened really helped her (augmentin).   - 10d augmentin BID - pt to schedule dental visit as soon as able.     Time spent during visit with patient: 7 minutes

## 2019-03-13 NOTE — Telephone Encounter (Signed)
Tried to contact pt to pre-chart and there was no answer and VM was full so unable to LVM.  If she calls back please put her through to me. April Zimmerman Rumple, CMA

## 2019-03-15 NOTE — Assessment & Plan Note (Signed)
Pt complains of one day of tooth pain, swelling, and purulent discharge.  Unable to visit dentist currently due to covid.  Stated the antibiotic she was prescribed the last time this happened really helped her (augmentin).   - 10d augmentin BID - pt to schedule dental visit as soon as able.

## 2019-03-18 ENCOUNTER — Other Ambulatory Visit: Payer: Self-pay

## 2019-03-18 DIAGNOSIS — E785 Hyperlipidemia, unspecified: Secondary | ICD-10-CM

## 2019-03-18 MED ORDER — LOSARTAN POTASSIUM 100 MG PO TABS: 100.0000 mg | ORAL_TABLET | Freq: Every day | ORAL | 1 refills | Status: DC

## 2019-03-18 MED ORDER — ATORVASTATIN CALCIUM 20 MG PO TABS: 20.0000 mg | ORAL_TABLET | Freq: Every day | ORAL | 1 refills | Status: DC

## 2019-04-21 ENCOUNTER — Other Ambulatory Visit: Payer: Self-pay | Admitting: Family Medicine

## 2019-04-21 DIAGNOSIS — I10 Essential (primary) hypertension: Secondary | ICD-10-CM

## 2019-10-28 ENCOUNTER — Other Ambulatory Visit: Payer: Self-pay

## 2019-10-28 ENCOUNTER — Ambulatory Visit (INDEPENDENT_AMBULATORY_CARE_PROVIDER_SITE_OTHER): Payer: Medicare HMO | Admitting: Family Medicine

## 2019-10-28 ENCOUNTER — Encounter: Payer: Self-pay | Admitting: Family Medicine

## 2019-10-28 VITALS — BP 118/58 | HR 66 | Ht 67.0 in | Wt 165.1 lb

## 2019-10-28 DIAGNOSIS — Z23 Encounter for immunization: Secondary | ICD-10-CM

## 2019-10-28 DIAGNOSIS — I10 Essential (primary) hypertension: Secondary | ICD-10-CM

## 2019-10-28 DIAGNOSIS — M545 Low back pain, unspecified: Secondary | ICD-10-CM

## 2019-10-28 DIAGNOSIS — R399 Unspecified symptoms and signs involving the genitourinary system: Secondary | ICD-10-CM

## 2019-10-28 DIAGNOSIS — R739 Hyperglycemia, unspecified: Secondary | ICD-10-CM

## 2019-10-28 LAB — POCT GLYCOSYLATED HEMOGLOBIN (HGB A1C): Hemoglobin A1C: 6 % — AB (ref 4.0–5.6)

## 2019-10-28 LAB — POCT URINALYSIS DIP (MANUAL ENTRY)
Bilirubin, UA: NEGATIVE
Glucose, UA: NEGATIVE mg/dL
Nitrite, UA: NEGATIVE
Protein Ur, POC: NEGATIVE mg/dL
Spec Grav, UA: 1.02 (ref 1.010–1.025)
Urobilinogen, UA: 0.2 E.U./dL
pH, UA: 7 (ref 5.0–8.0)

## 2019-10-28 LAB — POCT UA - MICROSCOPIC ONLY: Epithelial cells, urine per micros: >20

## 2019-10-28 MED ORDER — CYCLOBENZAPRINE HCL 5 MG PO TABS: 5.0000 mg | ORAL_TABLET | Freq: Every day | ORAL | 0 refills | Status: DC

## 2019-10-28 NOTE — Progress Notes (Signed)
   Subjective:    Patient ID: Jill [name], female    DOB: Mar 14, 1951, 68 y.o.   MRN: [id number]   CC: back pain  HPI: Back pain: stabbing pain that started Thursday, Dec 17th, on the right low back and has since spread to left low back. Has taken tylenol 1500mg  three times daily to help with pain. Has not tried heating pad or ice pack. Sitting backward and sitting foward make it worse. Laying down makes it a little better. Leaning forward and backwards makes the pain worse. Pain shoots down to her butt, but not into the foot. Denies any falls or injuries recently. Denies fevers, chest pain, shortness of breath, nausea, vomiting, abdominal pain, or rashes. Reports strong smelling odor (couple weeks). No hematuria or dysuria.    Smoking status reviewed - 1ppd, has slowed down some  Review of Systems - see HPI   Objective:  BP (!) 118/58   Pulse 66   Ht 5\' 7"  (1.702 m)   Wt 165 lb 2 oz (74.9 kg)   SpO2 93%   BMI 25.86 kg/m  Vitals and nursing note reviewed  Physical Exam:  General: well nourished, non-toxic appearing Neck: no LAD Cardiac: RRR, clear S1 and S2, no murmurs appreciated Respiratory: CTA bilaterally, comfortable work of breathing Extremities:  -Spine: no tenderness to palpation over bony midline spine, some tenderness elicited to bilateral low back and with ROM, no deformity appreciated; back pain elicited with FABER and FADIR but no hip pain; low back pain with R straight leg raise; no pain with crossed-straight leg raise -Hip: No gross deformity, no swelling, erythema, or ecchymosis; No TTP, specifically none over greater trochanter, normal ROM, negative trendelenberg Skin: warm and dry, no rashes noted Neuro: alert and oriented, no focal deficits   Assessment & Plan:   Acute bilateral low back pain without sciatica Pain feels muscular in nature, pain radiates from low back to R buttock, does not radiate into foot. -Patient instructed to stop taking so much  tylenol, instead take Naproxen 500mg  BID and Tylenol 500mg  q6 hours PRN pain.  -Patient given list of low back exercises to be performed twice daily -Flexeril 5mg  qHS for back pain -Ordering low back Xray to rule out osteophyte or other arthritic changes in low back -UA was also ordered to rule out UTI as cause of low back pain - urine sample was not a clean catch, revealed some LE, ketones and blood  UTI symptoms Patient had concern for UTI due to low back pain, but testing was negative. -No treatment at this time  Need for immunization against influenza Patient received flu vaccination today  Hyperglycemia Patient reporting concerns of UTI due to back pain and questionable urinary frequency, would like to rule out Diabetes as cause. -HbA1c ordered - within normal limits   Return if symptoms worsen or fail to improve.   Dr. Milus Banister [facility], PGY-2

## 2019-10-28 NOTE — Patient Instructions (Addendum)
Thank you for coming in to see Jill [name] today! Please see below to review our plan for today's visit:  1. Take Flexeril 5mg  at NIGHT as needed for back pain/spasms 2. We are ordering a Lumbar/Low Back Xray - please go to [facility] and have this done soon. 3. I will call you with your results and next steps.  4. You can take Tylenol 500mg  every 6 hours with Aleve 500mg  twice daily as needed for pain.   Please call the clinic at [phone] if your symptoms worsen or you have any concerns. It was our pleasure to serve you!    Dr. Milus Banister [facility]   Back Exercises These exercises help to make your trunk and back strong. They also help to keep the lower back flexible. Doing these exercises can help to prevent back pain or lessen existing pain.  If you have back pain, try to do these exercises 2-3 times each day or as told by your doctor.  As you get better, do the exercises once each day. Repeat the exercises more often as told by your doctor.  To stop back pain from coming back, do the exercises once each day, or as told by your doctor. Exercises Single knee to chest Do these steps 3-5 times in a row for each leg: 1. Lie on your back on a firm bed or the floor with your legs stretched out. 2. Bring one knee to your chest. 3. Grab your knee or thigh with both hands and hold them it in place. 4. Pull on your knee until you feel a gentle stretch in your lower back or buttocks. 5. Keep doing the stretch for 10-30 seconds. 6. Slowly let go of your leg and straighten it. Pelvic tilt Do these steps 5-10 times in a row: 1. Lie on your back on a firm bed or the floor with your legs stretched out. 2. Bend your knees so they point up to the ceiling. Your feet should be flat on the floor. 3. Tighten your lower belly (abdomen) muscles to press your lower back against the floor. This will make your tailbone point up to the ceiling instead of pointing down to your feet or  the floor. 4. Stay in this position for 5-10 seconds while you gently tighten your muscles and breathe evenly. Cat-cow Do these steps until your lower back bends more easily: 1. Get on your hands and knees on a firm surface. Keep your hands under your shoulders, and keep your knees under your hips. You may put padding under your knees. 2. Let your head hang down toward your chest. Tighten (contract) the muscles in your belly. Point your tailbone toward the floor so your lower back becomes rounded like the back of a cat. 3. Stay in this position for 5 seconds. 4. Slowly lift your head. Let the muscles of your belly relax. Point your tailbone up toward the ceiling so your back forms a sagging arch like the back of a cow. 5. Stay in this position for 5 seconds.  Press-ups Do these steps 5-10 times in a row: 1. Lie on your belly (face-down) on the floor. 2. Place your hands near your head, about shoulder-width apart. 3. While you keep your back relaxed and keep your hips on the floor, slowly straighten your arms to raise the top half of your body and lift your shoulders. Do not use your back muscles. You may change where you place your hands in  order to make yourself more comfortable. 4. Stay in this position for 5 seconds. 5. Slowly return to lying flat on the floor.  Bridges Do these steps 10 times in a row: 1. Lie on your back on a firm surface. 2. Bend your knees so they point up to the ceiling. Your feet should be flat on the floor. Your arms should be flat at your sides, next to your body. 3. Tighten your butt muscles and lift your butt off the floor until your waist is almost as high as your knees. If you do not feel the muscles working in your butt and the back of your thighs, slide your feet 1-2 inches farther away from your butt. 4. Stay in this position for 3-5 seconds. 5. Slowly lower your butt to the floor, and let your butt muscles relax. If this exercise is too easy, try doing it  with your arms crossed over your chest. Belly crunches Do these steps 5-10 times in a row: 1. Lie on your back on a firm bed or the floor with your legs stretched out. 2. Bend your knees so they point up to the ceiling. Your feet should be flat on the floor. 3. Cross your arms over your chest. 4. Tip your chin a little bit toward your chest but do not bend your neck. 5. Tighten your belly muscles and slowly raise your chest just enough to lift your shoulder blades a tiny bit off of the floor. Avoid raising your body higher than that, because it can put too much stress on your low back. 6. Slowly lower your chest and your head to the floor. Back lifts Do these steps 5-10 times in a row: 1. Lie on your belly (face-down) with your arms at your sides, and rest your forehead on the floor. 2. Tighten the muscles in your legs and your butt. 3. Slowly lift your chest off of the floor while you keep your hips on the floor. Keep the back of your head in line with the curve in your back. Look at the floor while you do this. 4. Stay in this position for 3-5 seconds. 5. Slowly lower your chest and your face to the floor. Contact a doctor if:  Your back pain gets a lot worse when you do an exercise.  Your back pain does not get better 2 hours after you exercise. If you have any of these problems, stop doing the exercises. Do not do them again unless your doctor says it is okay. Get help right away if:  You have sudden, very bad back pain. If this happens, stop doing the exercises. Do not do them again unless your doctor says it is okay.

## 2019-10-29 LAB — BASIC METABOLIC PANEL
BUN/Creatinine Ratio: 9 — ABNORMAL LOW (ref 12–28)
BUN: 10 mg/dL (ref 8–27)
CO2: 28 mmol/L (ref 20–29)
Calcium: 9.9 mg/dL (ref 8.7–10.3)
Chloride: 101 mmol/L (ref 96–106)
Creatinine, Ser: 1.16 mg/dL — ABNORMAL HIGH (ref 0.57–1.00)
GFR calc Af Amer: 55 mL/min/{1.73_m2} — ABNORMAL LOW (ref >59)
GFR calc non Af Amer: 48 mL/min/{1.73_m2} — ABNORMAL LOW (ref >59)
Glucose: 96 mg/dL (ref 65–99)
Potassium: 4.5 mmol/L (ref 3.5–5.2)
Sodium: 142 mmol/L (ref 134–144)

## 2019-11-08 DIAGNOSIS — R399 Unspecified symptoms and signs involving the genitourinary system: Secondary | ICD-10-CM | POA: Insufficient documentation

## 2019-11-08 DIAGNOSIS — M545 Low back pain, unspecified: Secondary | ICD-10-CM | POA: Insufficient documentation

## 2019-11-08 DIAGNOSIS — Z23 Encounter for immunization: Secondary | ICD-10-CM | POA: Insufficient documentation

## 2019-11-08 NOTE — Assessment & Plan Note (Signed)
Patient received flu vaccination today

## 2019-11-08 NOTE — Assessment & Plan Note (Addendum)
Pain feels muscular in nature, pain radiates from low back to R buttock, does not radiate into foot. -Patient instructed to stop taking so much tylenol, instead take Naproxen 500mg  BID and Tylenol 500mg  q6 hours PRN pain.  -Patient given list of low back exercises to be performed twice daily -Flexeril 5mg  qHS for back pain -Ordering low back Xray to rule out osteophyte or other arthritic changes in low back -UA was also ordered to rule out UTI as cause of low back pain - urine sample was not a clean catch, revealed some LE, ketones and blood

## 2019-11-08 NOTE — Assessment & Plan Note (Signed)
Patient reporting concerns of UTI due to back pain and questionable urinary frequency, would like to rule out Diabetes as cause. -HbA1c ordered - within normal limits

## 2019-11-08 NOTE — Assessment & Plan Note (Signed)
Patient had concern for UTI due to low back pain, but testing was negative. -No treatment at this time

## 2020-01-13 ENCOUNTER — Other Ambulatory Visit: Payer: Self-pay | Admitting: Family Medicine

## 2020-01-13 ENCOUNTER — Telehealth: Payer: Self-pay

## 2020-01-13 DIAGNOSIS — I10 Essential (primary) hypertension: Secondary | ICD-10-CM

## 2020-01-13 DIAGNOSIS — E785 Hyperlipidemia, unspecified: Secondary | ICD-10-CM

## 2020-01-13 MED ORDER — AMLODIPINE BESYLATE 10 MG PO TABS: 10.0000 mg | ORAL_TABLET | Freq: Every day | ORAL | 0 refills | Status: DC

## 2020-01-13 MED ORDER — ATORVASTATIN CALCIUM 20 MG PO TABS: 20.0000 mg | ORAL_TABLET | Freq: Every day | ORAL | 0 refills | Status: DC

## 2020-01-13 MED ORDER — PAROXETINE HCL 40 MG PO TABS: 40.0000 mg | ORAL_TABLET | Freq: Every day | ORAL | 1 refills | Status: DC

## 2020-01-13 MED ORDER — LEVOTHYROXINE SODIUM 150 MCG PO TABS: 150.0000 ug | ORAL_TABLET | Freq: Every day | ORAL | 0 refills | Status: DC

## 2020-01-13 MED ORDER — ASPIRIN EC 81 MG PO TBEC: 81.0000 mg | DELAYED_RELEASE_TABLET | Freq: Every day | ORAL | 2 refills | Status: DC

## 2020-01-13 MED ORDER — METOPROLOL SUCCINATE ER 50 MG PO TB24: ORAL_TABLET | ORAL | 1 refills | Status: DC

## 2020-01-13 MED ORDER — LOSARTAN POTASSIUM 100 MG PO TABS: 100.0000 mg | ORAL_TABLET | Freq: Every day | ORAL | 1 refills | Status: DC

## 2020-01-13 MED ORDER — ALBUTEROL SULFATE HFA 108 (90 BASE) MCG/ACT IN AERS: 2.0000 | INHALATION_SPRAY | Freq: Four times a day (QID) | RESPIRATORY_TRACT | 3 refills | Status: DC | PRN

## 2020-01-13 MED ORDER — OMEPRAZOLE 20 MG PO CPDR: 20.0000 mg | DELAYED_RELEASE_CAPSULE | Freq: Every day | ORAL | 1 refills | Status: DC

## 2020-01-13 MED ORDER — BUDESONIDE-FORMOTEROL FUMARATE 80-4.5 MCG/ACT IN AERO: 2.0000 | INHALATION_SPRAY | Freq: Two times a day (BID) | RESPIRATORY_TRACT | 2 refills | Status: DC

## 2020-01-13 NOTE — Telephone Encounter (Signed)
Refill completed. Please help her schedule f/u with me as she is due for lab work. Advise her to come in fasting for the appointment. Thanks

## 2020-01-13 NOTE — Telephone Encounter (Signed)
Spoke with pt. Made appt for 3/30. Salvatore Marvel, CMA

## 2020-01-13 NOTE — Telephone Encounter (Signed)
Patient calls nurse stating she needs all of her medications sent to [facility] due to cost. Please advise.

## 2020-02-03 ENCOUNTER — Ambulatory Visit: Payer: Medicare HMO | Admitting: Family Medicine

## 2020-02-19 ENCOUNTER — Encounter: Payer: Self-pay | Admitting: Family Medicine

## 2020-02-20 ENCOUNTER — Other Ambulatory Visit: Payer: Medicare HMO

## 2020-02-20 ENCOUNTER — Ambulatory Visit: Payer: Medicare HMO | Admitting: Family Medicine

## 2020-03-17 ENCOUNTER — Telehealth: Payer: Self-pay

## 2020-03-17 NOTE — Telephone Encounter (Signed)
Patient calls nurse line regarding dental abscess. Patient states that she is unable to drive for OV or to schedule with a dentist, due to not having gas. Patient states that she spoke with provider in the past and that provider would call in antibiotic if tooth became infected again.   Patient would like to use [facility] in [place].  Forwarding to PCP  Talbot Grumbling, RN

## 2020-03-17 NOTE — Telephone Encounter (Signed)
Attempted to call patient. Phone went straight to VM and mailbox was full. Will try again later.   Talbot Grumbling, RN

## 2020-03-17 NOTE — Telephone Encounter (Signed)
Please advise this patient that we will not be able to treat without seeing her. Also given there has been a lot of recurrence of her dental infection, it is appropriate for her to contact her dental office first for evaluation and treatment.

## 2020-03-18 NOTE — Telephone Encounter (Signed)
Attempted to contact patient to inform of below. Patient did not answer, mailbox full.   Talbot Grumbling, RN

## 2020-03-19 ENCOUNTER — Ambulatory Visit: Payer: Medicare HMO | Admitting: Family Medicine

## 2020-07-06 ENCOUNTER — Encounter: Payer: Self-pay | Admitting: Family Medicine

## 2020-07-06 ENCOUNTER — Ambulatory Visit (INDEPENDENT_AMBULATORY_CARE_PROVIDER_SITE_OTHER): Payer: Medicare HMO | Admitting: Family Medicine

## 2020-07-06 ENCOUNTER — Other Ambulatory Visit: Payer: Self-pay

## 2020-07-06 VITALS — BP 128/68 | HR 87 | Ht 67.0 in | Wt 160.0 lb

## 2020-07-06 DIAGNOSIS — I729 Aneurysm of unspecified site: Secondary | ICD-10-CM

## 2020-07-06 DIAGNOSIS — E1169 Type 2 diabetes mellitus with other specified complication: Secondary | ICD-10-CM | POA: Diagnosis not present

## 2020-07-06 DIAGNOSIS — Z720 Tobacco use: Secondary | ICD-10-CM

## 2020-07-06 DIAGNOSIS — Z1211 Encounter for screening for malignant neoplasm of colon: Secondary | ICD-10-CM

## 2020-07-06 DIAGNOSIS — E785 Hyperlipidemia, unspecified: Secondary | ICD-10-CM | POA: Diagnosis not present

## 2020-07-06 DIAGNOSIS — R32 Unspecified urinary incontinence: Secondary | ICD-10-CM

## 2020-07-06 DIAGNOSIS — Z23 Encounter for immunization: Secondary | ICD-10-CM

## 2020-07-06 DIAGNOSIS — F32A Depression, unspecified: Secondary | ICD-10-CM | POA: Insufficient documentation

## 2020-07-06 DIAGNOSIS — F329 Major depressive disorder, single episode, unspecified: Secondary | ICD-10-CM

## 2020-07-06 DIAGNOSIS — R7309 Other abnormal glucose: Secondary | ICD-10-CM | POA: Diagnosis not present

## 2020-07-06 DIAGNOSIS — E039 Hypothyroidism, unspecified: Secondary | ICD-10-CM | POA: Diagnosis not present

## 2020-07-06 DIAGNOSIS — J449 Chronic obstructive pulmonary disease, unspecified: Secondary | ICD-10-CM | POA: Diagnosis not present

## 2020-07-06 DIAGNOSIS — I2510 Atherosclerotic heart disease of native coronary artery without angina pectoris: Secondary | ICD-10-CM

## 2020-07-06 DIAGNOSIS — I10 Essential (primary) hypertension: Secondary | ICD-10-CM

## 2020-07-06 DIAGNOSIS — R7303 Prediabetes: Secondary | ICD-10-CM

## 2020-07-06 HISTORY — DX: Unspecified urinary incontinence: R32

## 2020-07-06 LAB — POCT GLYCOSYLATED HEMOGLOBIN (HGB A1C): HbA1c, POC (controlled diabetic range): 6 % (ref 0.0–7.0)

## 2020-07-06 MED ORDER — AMLODIPINE BESYLATE 10 MG PO TABS
10.0000 mg | ORAL_TABLET | Freq: Every day | ORAL | 1 refills | Status: DC
Start: 2020-07-06 — End: 2021-02-21

## 2020-07-06 MED ORDER — LOSARTAN POTASSIUM 100 MG PO TABS
100.0000 mg | ORAL_TABLET | Freq: Every day | ORAL | 1 refills | Status: DC
Start: 2020-07-06 — End: 2021-02-21

## 2020-07-06 MED ORDER — ESCITALOPRAM OXALATE 10 MG PO TABS: 10.0000 mg | ORAL_TABLET | Freq: Every day | ORAL | 1 refills | Status: DC

## 2020-07-06 MED ORDER — PAROXETINE HCL 10 MG PO TABS: 10.0000 mg | ORAL_TABLET | Freq: Every day | ORAL | 0 refills | Status: DC

## 2020-07-06 MED ORDER — OMEPRAZOLE 20 MG PO CPDR
20.0000 mg | DELAYED_RELEASE_CAPSULE | Freq: Every day | ORAL | 1 refills | Status: DC
Start: 2020-07-06 — End: 2021-10-26

## 2020-07-06 MED ORDER — ASPIRIN EC 81 MG PO TBEC: 81.0000 mg | DELAYED_RELEASE_TABLET | Freq: Every day | ORAL | 2 refills | Status: AC

## 2020-07-06 MED ORDER — FISH OIL 1000 MG PO CAPS
2.0000 | ORAL_CAPSULE | Freq: Two times a day (BID) | ORAL | 1 refills | Status: DC
Start: 2020-07-06 — End: 2021-10-26

## 2020-07-06 MED ORDER — METOPROLOL SUCCINATE ER 50 MG PO TB24: ORAL_TABLET | ORAL | 1 refills | Status: DC

## 2020-07-06 NOTE — Patient Instructions (Addendum)
Nice seeing you today.  Let us switch your antidepressant medication from Paxil to Lexapro. Now you are on Paxil 40 mg qd. Starting tomorrow, please take half of your Paxil tablet for 7 days. I.e Paxil 20 mg for 7 days. Then take Paxil 10 mg for 7 days - I will send in 10 mg Paxil. When you are on Paxil 10 mg, start Lexapro 10 mg. Stop Paxil 10 mg after days.  I will see you back in 4 week.   Kegel Exercises  Kegel exercises can help strengthen your pelvic floor muscles. The pelvic floor is a group of muscles that support your rectum, small intestine, and bladder. In females, pelvic floor muscles also help support the womb (uterus). These muscles help you control the flow of urine and stool. Kegel exercises are painless and simple, and they do not require any equipment. Your provider may suggest Kegel exercises to:  Improve bladder and bowel control.  Improve sexual response.  Improve weak pelvic floor muscles after surgery to remove the uterus (hysterectomy) or pregnancy (females).  Improve weak pelvic floor muscles after prostate gland removal or surgery (males). Kegel exercises involve squeezing your pelvic floor muscles, which are the same muscles you squeeze when you try to stop the flow of urine or keep from passing gas. The exercises can be done while sitting, standing, or lying down, but it is best to vary your position. Exercises How to do Kegel exercises: 1. Squeeze your pelvic floor muscles tight. You should feel a tight lift in your rectal area. If you are a female, you should also feel a tightness in your vaginal area. Keep your stomach, buttocks, and legs relaxed. 2. Hold the muscles tight for up to 10 seconds. 3. Breathe normally. 4. Relax your muscles. 5. Repeat as told by your health care provider. Repeat this exercise daily as told by your health care provider. Continue to do this exercise for at least 4-6 weeks, or for as long as told by your health care  provider. You may be referred to a physical therapist who can help you learn more about how to do Kegel exercises. Depending on your condition, your health care provider may recommend:  Varying how long you squeeze your muscles.  Doing several sets of exercises every day.  Doing exercises for several weeks.  Making Kegel exercises a part of your regular exercise routine. This information is not intended to replace advice given to you by your health care provider. Make sure you discuss any questions you have with your health care provider. Document Revised: 06/12/2018 Document Reviewed: 06/12/2018 Elsevier Patient Education  [place].

## 2020-07-06 NOTE — Assessment & Plan Note (Signed)
Office Visit from 07/06/2020 in [place]  PHQ-9 Total Score 16     She denies SI. CBT discussed but she declined at this point. She will like to trial a different SSRI. I discussed tapering off Paxil - Start Paxil 20 mg qd x 7 days from tomorrow. Start Paxil 10 mg QD x 7 days from 9/7 and add Lexapro 10 mg Qd from 9/7 Stop Paxil altogether after 9/14 F/U in 4 weeks for dose adjustment. She agreed with the plan and verbalized understanding. Discussed with Dr. Valentina Lucks as well.

## 2020-07-06 NOTE — Assessment & Plan Note (Signed)
Kegel exercise discussed. F/U soon if there is no improvement.

## 2020-07-06 NOTE — Assessment & Plan Note (Signed)
FLP checked. I will refill her meds once I have her result.

## 2020-07-06 NOTE — Assessment & Plan Note (Signed)
Counseling done today. She declined pharmacologic agents for smoking cessation.

## 2020-07-06 NOTE — Assessment & Plan Note (Signed)
A1c of 6 today. I called and discussed result with her. Continue diet and exercise to prevent DM.

## 2020-07-06 NOTE — Assessment & Plan Note (Signed)
BP looks good. Cmet checked. I refilled her meds.

## 2020-07-06 NOTE — Progress Notes (Signed)
SUBJECTIVE:   CHIEF COMPLAINT / HPI:   HTN/HLD: No concerns. Need med refill. She is compliant with Norvasc 10 mg qd, Lipitor 20 mg QD, Losartan 100 mg qd and Metoprolol 50 mg XL QD.   Hypothyroidism:She is compliant with her Synthroid 150 mgc qd. Need med refills.  COPD: Off all inhalers for months including her Albuterol PRN. She does not feel like she need them. She does endorse chronic cough and she coughed quite a bit during this visit. No SOB, no fever.  Depression: Her stress level has increased quite a bit. Her husband went back to drinking and they are about to lose their house. She does not feel that her current Paxil dose is effective for her mood. She denies SI.   Urine Incontinence: She loses urine whenever she coughs, laughs. She denies dysuria or change in her urin color.  Smoke: She currently smokes 1 PPD. Working on cutting back.  Skin lesion: Wants the wart on her face frozen.  HM: Here to f/u for routine health care.  PERTINENT  PMH / PSH: PMX reviewed  OBJECTIVE:   BP 128/68   Pulse 87   Ht 5\' 7"  (1.702 m)   Wt 160 lb (72.6 kg)   SpO2 93%   BMI 25.06 kg/m   Physical Exam Vitals and nursing note reviewed.  Cardiovascular:     Rate and Rhythm: Normal rate and regular rhythm.     Heart sounds: Normal heart sounds.  Pulmonary:     Effort: Pulmonary effort is normal.     Breath sounds: Normal breath sounds. No wheezing or rhonchi.  Abdominal:     General: Abdomen is flat. Bowel sounds are normal. There is no distension.     Palpations: Abdomen is soft. There is no mass.     Tenderness: There is no abdominal tenderness.  Musculoskeletal:     Right lower leg: No edema.     Left lower leg: No edema.  Skin:    Comments: Small, <1cm verruca like lesion on her left upper eyebrow  Neurological:     Mental Status: She is alert and oriented to person, place, and time.  Psychiatric:        Mood and Affect: Mood normal.        Behavior: Behavior normal.        ASSESSMENT/PLAN:   Hypertension BP looks good. Cmet checked. I refilled her meds.  Hyperlipidemia FLP checked. I will refill her meds once I have her result.  Hypothyroidism TSH checked. I will refill her synthroid once I have her result.  COPD (chronic obstructive pulmonary disease) (HCC) Poor compliance with her inhalers. She stated she does not need refills of her inhalers. Smoking cessation counseling done. She will attempt cessation cold [place]. F/U as needed.  Depression   Office Visit from 07/06/2020 in [place]  PHQ-9 Total Score 16     She denies SI. CBT discussed but she declined at this point. She will like to trial a different SSRI. I discussed tapering off Paxil - Start Paxil 20 mg qd x 7 days from tomorrow. Start Paxil 10 mg QD x 7 days from 9/7 and add Lexapro 10 mg Qd from 9/7 Stop Paxil altogether after 9/14 F/U in 4 weeks for dose adjustment. She agreed with the plan and verbalized understanding. Discussed with Dr. Valentina Lucks as well.  Urine incontinence Kegel exercise discussed. F/U soon if there is no improvement.  Tobacco abuse Counseling done today.  She declined pharmacologic agents for smoking cessation.  Coronary artery disease Continue ASA and Statins.  Prediabetes A1c of 6 today. I called and discussed result with her. Continue diet and exercise to prevent DM.   Facial wart: F/U in Derm clinic or with me for Cryotherapy.  Health maintenance: She does not ever want to get Mammogram done.Adequate counseling done. She will not get colonoscopy done, but opted for cologuard, after counseling. Cologuard ordered. She got flu shot today.  Andrena Mews, MD [place]

## 2020-07-06 NOTE — Assessment & Plan Note (Signed)
TSH checked. I will refill her synthroid once I have her result.

## 2020-07-06 NOTE — Assessment & Plan Note (Signed)
Continue ASA and Statins.

## 2020-07-06 NOTE — Assessment & Plan Note (Addendum)
Poor compliance with her inhalers. She stated she does not need refills of her inhalers. Smoking cessation counseling done. She will attempt cessation cold [place]. F/U as needed.

## 2020-07-07 ENCOUNTER — Encounter: Payer: Self-pay | Admitting: Family Medicine

## 2020-07-07 ENCOUNTER — Telehealth: Payer: Self-pay | Admitting: Family Medicine

## 2020-07-07 DIAGNOSIS — N189 Chronic kidney disease, unspecified: Secondary | ICD-10-CM | POA: Insufficient documentation

## 2020-07-07 DIAGNOSIS — E785 Hyperlipidemia, unspecified: Secondary | ICD-10-CM

## 2020-07-07 DIAGNOSIS — Z5320 Procedure and treatment not carried out because of patient's decision for unspecified reasons: Secondary | ICD-10-CM | POA: Insufficient documentation

## 2020-07-07 DIAGNOSIS — N1832 Chronic kidney disease, stage 3b: Secondary | ICD-10-CM

## 2020-07-07 LAB — CMP14+EGFR
ALT: 11 IU/L (ref 0–32)
AST: 18 IU/L (ref 0–40)
Albumin/Globulin Ratio: 1.4 (ref 1.2–2.2)
Albumin: 4.2 g/dL (ref 3.7–4.7)
Alkaline Phosphatase: 83 IU/L (ref 48–121)
BUN/Creatinine Ratio: 10 — ABNORMAL LOW (ref 12–28)
BUN: 13 mg/dL (ref 8–27)
Bilirubin Total: 0.2 mg/dL (ref 0.0–1.2)
CO2: 25 mmol/L (ref 20–29)
Calcium: 9.8 mg/dL (ref 8.7–10.3)
Chloride: 100 mmol/L (ref 96–106)
Creatinine, Ser: 1.26 mg/dL — ABNORMAL HIGH (ref 0.57–1.00)
GFR calc Af Amer: 50 mL/min/{1.73_m2} — ABNORMAL LOW (ref >59)
GFR calc non Af Amer: 43 mL/min/{1.73_m2} — ABNORMAL LOW (ref >59)
Globulin, Total: 2.9 g/dL (ref 1.5–4.5)
Glucose: 128 mg/dL — ABNORMAL HIGH (ref 65–99)
Potassium: 4.4 mmol/L (ref 3.5–5.2)
Sodium: 140 mmol/L (ref 134–144)
Total Protein: 7.1 g/dL (ref 6.0–8.5)

## 2020-07-07 LAB — LIPID PANEL
Chol/HDL Ratio: 8.8 ratio — ABNORMAL HIGH (ref 0.0–4.4)
Cholesterol, Total: 263 mg/dL — ABNORMAL HIGH (ref 100–199)
HDL: 30 mg/dL — ABNORMAL LOW (ref >39)
LDL Chol Calc (NIH): 177 mg/dL — ABNORMAL HIGH (ref 0–99)
Triglycerides: 288 mg/dL — ABNORMAL HIGH (ref 0–149)
VLDL Cholesterol Cal: 56 mg/dL — ABNORMAL HIGH (ref 5–40)

## 2020-07-07 LAB — TSH: TSH: 2.83 u[IU]/mL (ref 0.450–4.500)

## 2020-07-07 MED ORDER — LEVOTHYROXINE SODIUM 150 MCG PO TABS
150.0000 ug | ORAL_TABLET | Freq: Every day | ORAL | 1 refills | Status: DC
Start: 2020-07-07 — End: 2020-10-14

## 2020-07-07 MED ORDER — ATORVASTATIN CALCIUM 40 MG PO TABS: 40.0000 mg | ORAL_TABLET | Freq: Every day | ORAL | 1 refills | Status: DC

## 2020-07-07 NOTE — Telephone Encounter (Signed)
She did not pick up.  If she calls, let her know that her cholesterol test is elevated. I will go up on her cholesterol medication dose. Lipitor 20 mg to 40 mg.  Thyroid test is fine. I will keep her on her current dose of Synthroid 150 mcg.  Kidney test still abnormal. Avoid NSAIDs. Keep well hydrated. Recheck in 2-4 months.

## 2020-07-07 NOTE — Telephone Encounter (Signed)
Patient returns call to nurse line. Patient advised of results and medications changes. Patient advised of meds to mail order pharmacy. Patient advised to stay well hydrated to avoid NSAIDS and repeat in 2-4 months. Patient appreciative of call.

## 2020-07-07 NOTE — Telephone Encounter (Signed)
Unable to leave a message.  Will call later.

## 2020-07-07 NOTE — Addendum Note (Signed)
Addended by: Andrena Mews T on: 07/07/2020 10:34 AM   Modules accepted: Orders

## 2020-07-15 ENCOUNTER — Ambulatory Visit (INDEPENDENT_AMBULATORY_CARE_PROVIDER_SITE_OTHER): Payer: Medicare HMO | Admitting: Family Medicine

## 2020-07-15 DIAGNOSIS — Z5329 Procedure and treatment not carried out because of patient's decision for other reasons: Secondary | ICD-10-CM

## 2020-07-16 DIAGNOSIS — Z5329 Procedure and treatment not carried out because of patient's decision for other reasons: Secondary | ICD-10-CM | POA: Insufficient documentation

## 2020-07-16 DIAGNOSIS — Z91199 Patient's noncompliance with other medical treatment and regimen due to unspecified reason: Secondary | ICD-10-CM | POA: Insufficient documentation

## 2020-07-19 ENCOUNTER — Other Ambulatory Visit: Payer: Self-pay

## 2020-07-19 DIAGNOSIS — I10 Essential (primary) hypertension: Secondary | ICD-10-CM

## 2020-07-19 MED ORDER — METOPROLOL SUCCINATE ER 50 MG PO TB24: ORAL_TABLET | ORAL | 1 refills | Status: DC

## 2020-07-20 ENCOUNTER — Telehealth: Payer: Self-pay

## 2020-07-20 NOTE — Telephone Encounter (Signed)
Received phone call from patient regarding issues with receiving lexapro rx and amlodipine rx. Called and spoke with pharmacist. Amlodipine is ready to be picked up and has been held for patient. However, lexapro is requiring a PA. Pharmacist will fax paperwork to initiate process. Patient is agreeable to paying $15 for lexapro until we can complete the PA.   Talbot Grumbling, RN

## 2020-07-20 NOTE — Assessment & Plan Note (Signed)
Patient no showed appt's 02/20/20, 03/19/20, and 07/15/20.  Patient sent a letter 07/20/2020.

## 2020-07-20 NOTE — Progress Notes (Signed)
Patient no showed appt 07/15/20 and 03/19/2020, 02/20/20.   Patient sent a letter 07/20/2020.  Milus Banister, [place], PGY-3 07/20/2020 1:06 PM

## 2020-08-02 DIAGNOSIS — Z1211 Encounter for screening for malignant neoplasm of colon: Secondary | ICD-10-CM | POA: Diagnosis not present

## 2020-08-02 DIAGNOSIS — Z1212 Encounter for screening for malignant neoplasm of rectum: Secondary | ICD-10-CM | POA: Diagnosis not present

## 2020-08-02 LAB — COLOGUARD
Cologuard: NEGATIVE
Cologuard: NEGATIVE

## 2020-08-04 LAB — COLOGUARD: COLOGUARD: NEGATIVE

## 2020-08-05 ENCOUNTER — Other Ambulatory Visit: Payer: Self-pay

## 2020-08-05 ENCOUNTER — Ambulatory Visit (INDEPENDENT_AMBULATORY_CARE_PROVIDER_SITE_OTHER): Payer: Medicare HMO | Admitting: Family Medicine

## 2020-08-05 VITALS — BP 130/70 | HR 73 | Ht 67.0 in | Wt 160.4 lb

## 2020-08-05 DIAGNOSIS — B079 Viral wart, unspecified: Secondary | ICD-10-CM

## 2020-08-05 DIAGNOSIS — L570 Actinic keratosis: Secondary | ICD-10-CM | POA: Diagnosis not present

## 2020-08-05 DIAGNOSIS — K130 Diseases of lips: Secondary | ICD-10-CM | POA: Diagnosis not present

## 2020-08-05 DIAGNOSIS — L720 Epidermal cyst: Secondary | ICD-10-CM | POA: Diagnosis not present

## 2020-08-05 NOTE — Patient Instructions (Signed)
Nice to meet you  For   The Left forehead lesion - it should get red then blister then fall off.  If it does not completely go away then come back  For the Right eye lesion - it should heal completely  For the Left arm lesion  it should get red then blister then fall off.  If it does not completely go away then come back - this could be a skin cancer and we might need to biopsy it  I will ask Dr Gwendlyn Deutscher to refer you to dermatology for the lip lesion   If any of the lesions get really painful or have pus coming out let us know right away

## 2020-08-06 ENCOUNTER — Encounter: Payer: Self-pay | Admitting: Family Medicine

## 2020-08-06 ENCOUNTER — Telehealth: Payer: Self-pay | Admitting: Family Medicine

## 2020-08-06 DIAGNOSIS — K130 Diseases of lips: Secondary | ICD-10-CM | POA: Insufficient documentation

## 2020-08-06 NOTE — Progress Notes (Signed)
Cologuard report reviewed and it was negative. I will contact the patient with her result and also scan report to file.

## 2020-08-06 NOTE — Progress Notes (Signed)
    SUBJECTIVE:   CHIEF COMPLAINT / HPI:   Patient presents to [facility] clinic with the following  Lesion on Left forehead - present for months to years.  Seems to be growing  Lesion lateal to R eye - also for months to years and staying about the same.  No pain or discharge  Lesion on L forearm - picks off and then comes back.  Not painfull  Lesion on upper lip - thinks has been present for years but is unsure.  Thinks is growing    PERTINENT  PMH / PSH:No personal history of skin cancer.  Did have a lot of sun exposure  No blood thinners  OBJECTIVE:   BP 130/70   Pulse 73   Ht 5\' 7"  (1.702 m)   Wt 160 lb 6.4 oz (72.8 kg)   SpO2 92%   BMI 25.12 kg/m           Procedures Consent signed, appropriate time outs taken     ASSESSMENT/PLAN:   No problem-specific Assessment & Plan notes found for this encounter.   Lesion on Left forehead - consistent with wart. Cryotherapy freeze/thaw x 3.  Tolerated well  Lesion lateal to R eye - appears to be inclusion cyst.  Area anesthetized with 1% lidocaine with epi and cleansed.  2 mm punch taken with expression of thick homogenous white materail.  Hemostasis with silver nitrate.  Tolerated well with minimal bleeding  Lesion on L forearm - consistent with  AK.   Cryotherapy freeze/thaw x 3.  Tolerated well  Lesion on upper lip - uncertain cause but could be basal cell cancer.  Given location would refer specialist dermatology for evaluation    Lind Covert, [place]

## 2020-08-06 NOTE — Telephone Encounter (Signed)
I was unable to leave a callback message.   Should she return my call, please advise her that her cologuard screening test for colon cancer was negative. We will repeat in 3 years. Should she decide on getting colonoscopy done in the future, I will refer her to a gastroenterologist.

## 2020-08-27 ENCOUNTER — Ambulatory Visit: Payer: Medicare HMO | Admitting: Family Medicine

## 2020-09-13 ENCOUNTER — Other Ambulatory Visit: Payer: Self-pay | Admitting: Family Medicine

## 2020-10-13 ENCOUNTER — Other Ambulatory Visit: Payer: Self-pay | Admitting: Family Medicine

## 2021-02-20 ENCOUNTER — Other Ambulatory Visit: Payer: Self-pay | Admitting: Family Medicine

## 2021-02-21 ENCOUNTER — Other Ambulatory Visit: Payer: Self-pay | Admitting: *Deleted

## 2021-02-21 DIAGNOSIS — E785 Hyperlipidemia, unspecified: Secondary | ICD-10-CM

## 2021-02-21 MED ORDER — ATORVASTATIN CALCIUM 40 MG PO TABS: 40.0000 mg | ORAL_TABLET | Freq: Every day | ORAL | 1 refills | Status: DC

## 2021-04-12 ENCOUNTER — Other Ambulatory Visit: Payer: Self-pay

## 2021-04-12 ENCOUNTER — Ambulatory Visit (INDEPENDENT_AMBULATORY_CARE_PROVIDER_SITE_OTHER): Payer: Medicare HMO | Admitting: Family Medicine

## 2021-04-12 ENCOUNTER — Ambulatory Visit (INDEPENDENT_AMBULATORY_CARE_PROVIDER_SITE_OTHER): Payer: Medicare HMO

## 2021-04-12 ENCOUNTER — Encounter: Payer: Self-pay | Admitting: Family Medicine

## 2021-04-12 VITALS — BP 116/66 | HR 61 | Ht 67.0 in | Wt 133.4 lb

## 2021-04-12 DIAGNOSIS — Z23 Encounter for immunization: Secondary | ICD-10-CM

## 2021-04-12 DIAGNOSIS — E039 Hypothyroidism, unspecified: Secondary | ICD-10-CM | POA: Diagnosis not present

## 2021-04-12 DIAGNOSIS — Z1231 Encounter for screening mammogram for malignant neoplasm of breast: Secondary | ICD-10-CM

## 2021-04-12 DIAGNOSIS — Z0000 Encounter for general adult medical examination without abnormal findings: Secondary | ICD-10-CM

## 2021-04-12 DIAGNOSIS — R7303 Prediabetes: Secondary | ICD-10-CM

## 2021-04-12 DIAGNOSIS — R55 Syncope and collapse: Secondary | ICD-10-CM

## 2021-04-12 DIAGNOSIS — R634 Abnormal weight loss: Secondary | ICD-10-CM | POA: Diagnosis not present

## 2021-04-12 DIAGNOSIS — I10 Essential (primary) hypertension: Secondary | ICD-10-CM | POA: Diagnosis not present

## 2021-04-12 DIAGNOSIS — J449 Chronic obstructive pulmonary disease, unspecified: Secondary | ICD-10-CM | POA: Diagnosis not present

## 2021-04-12 DIAGNOSIS — R7309 Other abnormal glucose: Secondary | ICD-10-CM

## 2021-04-12 DIAGNOSIS — Z8679 Personal history of other diseases of the circulatory system: Secondary | ICD-10-CM | POA: Diagnosis not present

## 2021-04-12 DIAGNOSIS — S0990XA Unspecified injury of head, initial encounter: Secondary | ICD-10-CM

## 2021-04-12 DIAGNOSIS — I729 Aneurysm of unspecified site: Secondary | ICD-10-CM | POA: Diagnosis not present

## 2021-04-12 DIAGNOSIS — Z122 Encounter for screening for malignant neoplasm of respiratory organs: Secondary | ICD-10-CM

## 2021-04-12 DIAGNOSIS — E2839 Other primary ovarian failure: Secondary | ICD-10-CM

## 2021-04-12 LAB — POCT GLYCOSYLATED HEMOGLOBIN (HGB A1C): HbA1c, POC (prediabetic range): 6 % (ref 5.7–6.4)

## 2021-04-12 MED ORDER — ZOSTER VAC RECOMB ADJUVANTED 50 MCG/0.5ML IM SUSR: 0.5000 mL | Freq: Once | INTRAMUSCULAR | 1 refills | Status: AC

## 2021-04-12 MED ORDER — MONTELUKAST SODIUM 10 MG PO TABS: 10.0000 mg | ORAL_TABLET | Freq: Every day | ORAL | 3 refills | Status: DC

## 2021-04-12 NOTE — Assessment & Plan Note (Signed)
Syncope: ?? Vaso vagal vs dehydration vs cardiac vs neurologic. ECHO ordered. She is concerned of her hx of aneurysm as a contributor to her symptoms and will like to get brain imaging. Per the patient, she had brain aneurysm surgery done in 2012 and has been stable since then with poor neuro f/u. CT head ordered. ED precaution discussed.

## 2021-04-12 NOTE — Assessment & Plan Note (Addendum)
COPD/Smoking: Working on quitting. Lung cancer screening discussed and CT chest was ordered. I discussed inhaled steroid. Plan to transition from Symbicort to  [facility]. However, I realized she has allergy to prednisone.  I called her back and she stated that she never tried inhaled steroids, and in the past, Prednisone made her homicidal. For now we will hold off on inhaled steroids. I discussed LAMA vs referral to pulm in the future if COPD worsens. Will also discuss following CT result. She agreed with the plan.

## 2021-04-12 NOTE — Patient Instructions (Signed)
Vitamin A capsules and tablets What is this medicine? VITAMIN A (VAHY tuh min A) is a vitamin found in nature. It is added to a healthy diet to prevent or treat low vitamin A levels. It is also used to treat some genetic skin problems. This vitamin may be used for other purposes; ask your health care provider or pharmacist if you have questions. This medicine may be used for other purposes; ask your health care provider or pharmacist if you have questions. COMMON BRAND NAME(S): A-Caro-25, Dofsol-A What should I tell my health care provider before I take this medicine? They need to know if you have any of the following conditions:  high levels of vitamin A in the body  kidney disease  liver disease  an unusual or allergic reaction to vitamin A, other medicines, foods, dyes, or preservatives  pregnant or trying to get pregnant  breast-feeding How should I use this medicine? Take this vitamin by mouth with a glass of water. Follow the directions on the package or prescription label. For best results take this vitamin with food. Take your vitamin at regular intervals. Do not take your vitamin more often than directed. Talk to your pediatrician regarding the use of this medicine in children. While this drug may be prescribed for selected conditions, precautions do apply. Overdosage: If you think you have taken too much of this medicine contact a poison control center or emergency room at once. NOTE: This medicine is only for you. Do not share this medicine with others. What if I miss a dose? If you miss a dose, take it as soon as you can. If it is almost time for your next dose, take only that dose. Do not take double or extra doses. What may interact with this medicine? Do not take this medicine with any of the following medications:  other vitamin A or retinoid products This medicine may also interact with the following medications:  beta-carotene  supplements  cholestyramine  mineral oil  orlistat This list may not describe all possible interactions. Give your health care provider a list of all the medicines, herbs, non-prescription drugs, or dietary supplements you use. Also tell them if you smoke, drink alcohol, or use illegal drugs. Some items may interact with your medicine. What should I watch for while using this medicine? Follow a good diet. Taking a vitamin supplement does not replace the need for a balanced diet. Some foods that have this vitamin naturally are green and yellow fruits and vegetables, also eggs, butter, milk, meat, and oily fish. Too much of this vitamin can be unsafe. Talk to your doctor or health care provider about how much is right for you. What side effects may I notice from receiving this medicine? Side effects that you should report to your doctor or health care professional as soon as possible:  allergic reactions like skin rash, itching or hives, swelling of the face, lips, or tongue  dark urine  dry, cracked or peeling of skin  joint pains  unusual bleeding or bruising  unusually weak or tired  yellowing of the eyes or skin Side effects that usually do not require medical attention (report to your doctor or health care professional if they continue or are bothersome):  diarrhea  yellowing of the face, palms of the hands, soles of the feet This list may not describe all possible side effects. Call your doctor for medical advice about side effects. You may report side effects to FDA at 1-800-FDA-1088. Where should  I keep my medicine? Keep out of the reach of children. Store at room temperature between 15 and 30 degrees C (59 and 85 degrees F). Protect from heat and light. Throw away any unused medicine after the expiration date. NOTE: This sheet is a summary. It may not cover all possible information. If you have questions about this medicine, talk to your doctor, pharmacist, or health care  provider.  2021 Elsevier/Gold Standard (2008-07-20 17:37:20)

## 2021-04-12 NOTE — Assessment & Plan Note (Signed)
A1C remains good.

## 2021-04-12 NOTE — Progress Notes (Addendum)
Subjective:     Jill [name] is a 70 y.o. female and is here for a comprehensive physical exam. The patient reports problems - Weight loss and stuffy nose (3-4 month). Nasal drainage. Uses Claritin which helps some with musinex.  Patient had a fainting spell the end of April 2022. She was out gardening. Then she found herself on the floor unconscious following a coughing spell. She hit the right side of her head against the hard floor and had a headache right side x 1 week with black eyes. Feels better today but will like to get brain imaging. She did not go to the ED following her syncope. No new concerns.  PreDM: here for follow-up.  Social hx/COPD: Still smokes a pack. Never started her Symbicort for COPD due to cost. She uses albuterol as needed. She had persistent daily cough.  Social History   Socioeconomic History  . Marital status: Married    Spouse name: Not on file  . Number of children: Not on file  . Years of education: Not on file  . Highest education level: Not on file  Occupational History  . Not on file  Tobacco Use  . Smoking status: Current Every Day Smoker    Packs/day: 1.50    Years: 58.00    Pack years: 87.00    Types: Cigarettes    Start date: 11/06/1957  . Smokeless tobacco: Never Used  . Tobacco comment: Has smoked 3 ppd, current 1.5-2 ppd  Substance and Sexual Activity  . Alcohol use: No  . Drug use: No  . Sexual activity: Yes    Birth control/protection: Post-menopausal  Other Topics Concern  . Not on file  Social History Writer.  Some college.  Currently retired.  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 worked in [employer] most recently.      Lives with husband and 2 adult daughters and 4 grandchildren.   Social Determinants of Health   Financial Resource Strain: Not on file  Food Insecurity: Not on file  Transportation Needs: Not on file  Physical Activity: Not on file  Stress: Not on file  Social  Connections: Not on file  Intimate Partner Violence: Not on file   Health Maintenance  Topic Date Due  . Pneumococcal Vaccine 86-69 Years old (1 of 4 - PCV13) Never done  . Zoster Vaccines- Shingrix (1 of 2) Never done  . DEXA SCAN  Never done  . COVID-19 Vaccine (3 - Pfizer risk 4-dose series) 06/03/2020  . INFLUENZA VACCINE  06/06/2021  . TETANUS/TDAP  06/05/2022  . Fecal DNA (Cologuard)  08/03/2023  . Hepatitis C Screening  Completed  . PNA vac Low Risk Adult  Completed  . HPV VACCINES  Aged Out    The following portions of the patient's history were reviewed and updated as appropriate: allergies, current medications, past family history, past medical history, past social history, past surgical history and problem list.  Review of Systems Pertinent items noted in HPI and remainder of comprehensive ROS otherwise negative.   Objective:    BP 116/66   Pulse 61   Ht 5\' 7"  (1.702 m)   Wt 133 lb 6.4 oz (60.5 kg)   SpO2 91%   BMI 20.89 kg/m  General appearance: alert and cooperative Head: Normocephalic, without obvious abnormality, atraumatic Eyes: conjunctivae/corneas clear. PERRL, EOM's intact. Fundi benign., mild hyperpigmented/brown pigment underneath her eye fold B/L Ears: normal TM's and external ear canals both ears Neck:  no adenopathy, no carotid bruit, no JVD, supple, symmetrical, trachea midline and thyroid not enlarged, symmetric, no tenderness/mass/nodules Lungs: clear to auscultation bilaterally Heart: regular rate and rhythm, S1, S2 normal, no murmur, click, rub or gallop Abdomen: soft, non-tender; bowel sounds normal; no masses,  no organomegaly Extremities: extremities normal, atraumatic, no cyanosis or edema Neurologic: Alert and oriented X 3, normal strength and tone. Normal symmetric reflexes. Normal coordination and gait    Assessment:    Healthy female exam.    Weight loss  Allergic rhinitis  Syncope  PreDM  COPD/Smoking  Other chronic  problems Plan:  Well exam benign. COVID vaccine and PCV20 were administered today. Shingrix discussed and will schedule vaccination at her pharmacy. - Vaccine escribed. She is up to date witjh cologuard, does not want colonoscopy. Mammogram discussed and was ordered. Dexa scan discussed and was ordered. She will call to schedule her own appointment.  Weight loss: Likely due to poor nutrient intake. I recommended MVI for appetite stimulation. HIV screening done. Monitor closely for improvement.  Allergic rhinitis: SIngulair escribed. May continue Claritin and mucinex prn.  Monitor for improvement.  COPD/Smoking: Working on quitting. Lung cancer screening discussed and CT chest was ordered. I discussed inhaled steroid. Plan to transition from Symbicort to  [facility]. However, I realized she has allergy to prednisone.  I called her back and she stated that she never tried inhaled steroids, and in the past, Prednisone made her homicidal. For now we will hold off on inhaled steroids. I discussed LAMA vs referral to pulm in the future if COPD worsens. Will also discuss following CT result. She agreed with the plan.  Syncope: ?? Vaso vagal vs dehydration vs cardiac vs neurologic. ECHO ordered. She is concerned of her hx of aneurysm as a contributor to her symptoms and will like to get brain imaging. Per the patient, she had brain aneurysm surgery done in 2012 and has been stable since then with poor neuro f/u. CT head ordered. ED precaution discussed.  PreDM: A1C remains good.   Other chronic problems addressed: FLP, TSH, Bmet checked during this visit.

## 2021-04-13 ENCOUNTER — Other Ambulatory Visit: Payer: Self-pay | Admitting: Family Medicine

## 2021-04-13 DIAGNOSIS — R55 Syncope and collapse: Secondary | ICD-10-CM | POA: Diagnosis not present

## 2021-04-13 LAB — BASIC METABOLIC PANEL
BUN/Creatinine Ratio: 14 (ref 12–28)
BUN: 13 mg/dL (ref 8–27)
CO2: 22 mmol/L (ref 20–29)
Calcium: 9.6 mg/dL (ref 8.7–10.3)
Chloride: 102 mmol/L (ref 96–106)
Creatinine, Ser: 0.95 mg/dL (ref 0.57–1.00)
Glucose: 103 mg/dL — ABNORMAL HIGH (ref 65–99)
Potassium: 4.6 mmol/L (ref 3.5–5.2)
Sodium: 141 mmol/L (ref 134–144)
eGFR: 64 mL/min/{1.73_m2} (ref >59)

## 2021-04-13 LAB — LIPID PANEL
Chol/HDL Ratio: 3.6 ratio (ref 0.0–4.4)
Cholesterol, Total: 116 mg/dL (ref 100–199)
HDL: 32 mg/dL — ABNORMAL LOW (ref >39)
LDL Chol Calc (NIH): 63 mg/dL (ref 0–99)
Triglycerides: 111 mg/dL (ref 0–149)
VLDL Cholesterol Cal: 21 mg/dL (ref 5–40)

## 2021-04-13 LAB — TSH: TSH: 0.064 u[IU]/mL — ABNORMAL LOW (ref 0.450–4.500)

## 2021-04-13 LAB — HIV ANTIBODY (ROUTINE TESTING W REFLEX): HIV Screen 4th Generation wRfx: NONREACTIVE

## 2021-04-13 MED ORDER — LEVOTHYROXINE SODIUM 137 MCG PO TABS: 137.0000 ug | ORAL_TABLET | Freq: Every day | ORAL | 1 refills | Status: DC

## 2021-04-13 NOTE — Telephone Encounter (Signed)
Test result discussed. TSH low.  I recommended cutting back on her Synthroid from 150 to 137 mcg. Med escribed.  She asked about anemia. Unfortunately, we did not check her CBC during the last visit since she denies any GI bleed. We will get this checked with her TSH in 4 weeks. She agreed with the plan.   NB: I was able to add on CBC to her lab test from yesterday. I will follow.

## 2021-04-13 NOTE — Addendum Note (Signed)
Addended by: Andrena Mews T on: 04/13/2021 09:53 AM   Modules accepted: Orders

## 2021-04-13 NOTE — Addendum Note (Signed)
Addended by: Andrena Mews T on: 04/13/2021 09:44 AM   Modules accepted: Orders

## 2021-04-14 LAB — CBC WITH DIFFERENTIAL/PLATELET
Basophils Absolute: 0.1 10*3/uL (ref 0.0–0.2)
Basos: 2 %
EOS (ABSOLUTE): 0.2 10*3/uL (ref 0.0–0.4)
Eos: 3 %
Hematocrit: 40.8 % (ref 34.0–46.6)
Hemoglobin: 13.6 g/dL (ref 11.1–15.9)
Immature Grans (Abs): 0 10*3/uL (ref 0.0–0.1)
Immature Granulocytes: 0 %
Lymphocytes Absolute: 2.8 10*3/uL (ref 0.7–3.1)
Lymphs: 35 %
MCH: 30.9 pg (ref 26.6–33.0)
MCHC: 33.3 g/dL (ref 31.5–35.7)
MCV: 93 fL (ref 79–97)
Monocytes Absolute: 0.6 10*3/uL (ref 0.1–0.9)
Monocytes: 7 %
Neutrophils Absolute: 4.2 10*3/uL (ref 1.4–7.0)
Neutrophils: 53 %
Platelets: 328 10*3/uL (ref 150–450)
RBC: 4.4 x10E6/uL (ref 3.77–5.28)
RDW: 11.8 % (ref 11.7–15.4)
WBC: 7.9 10*3/uL (ref 3.4–10.8)

## 2021-04-14 NOTE — Progress Notes (Signed)
Hello team,  Please contact this patient and advise her that her hemoglobin is normal and she does not have anemia. Thanks.

## 2021-04-20 ENCOUNTER — Ambulatory Visit (HOSPITAL_COMMUNITY): Payer: Medicare HMO

## 2021-04-26 ENCOUNTER — Ambulatory Visit (HOSPITAL_COMMUNITY): Payer: Medicare HMO

## 2021-04-26 MED ORDER — LEVOTHYROXINE SODIUM 137 MCG PO TABS: 137.0000 ug | ORAL_TABLET | Freq: Every day | ORAL | 1 refills | Status: DC

## 2021-04-26 NOTE — Addendum Note (Signed)
Addended by: Talbot Grumbling on: 04/26/2021 02:08 PM   Modules accepted: Orders

## 2021-04-26 NOTE — Telephone Encounter (Signed)
Patient calls nurse line regarding rx for synthroid. Patient no longer has coverage with [employer], therefore, they canceled the rx after it was sent in.   Patient has been without medication for a few weeks now. Please advise if you would like this dosage to be sent to local, pended pharmacy or if patient needs labs prior due to the amount of time she has been off of medication.   Talbot Grumbling, RN

## 2021-04-28 ENCOUNTER — Ambulatory Visit (HOSPITAL_COMMUNITY)
Admission: RE | Admit: 2021-04-28 | Discharge: 2021-04-28 | Disposition: A | Discharge status: Home / Self Care | Payer: Medicare HMO | Source: Ambulatory Visit | Attending: Family Medicine | Admitting: Family Medicine

## 2021-04-28 ENCOUNTER — Other Ambulatory Visit: Payer: Self-pay

## 2021-04-28 ENCOUNTER — Ambulatory Visit (HOSPITAL_COMMUNITY): Payer: Medicare HMO

## 2021-04-28 DIAGNOSIS — Z87891 Personal history of nicotine dependence: Secondary | ICD-10-CM | POA: Diagnosis not present

## 2021-04-28 DIAGNOSIS — R634 Abnormal weight loss: Secondary | ICD-10-CM

## 2021-04-28 DIAGNOSIS — S0990XA Unspecified injury of head, initial encounter: Secondary | ICD-10-CM

## 2021-04-28 DIAGNOSIS — R55 Syncope and collapse: Secondary | ICD-10-CM | POA: Diagnosis not present

## 2021-04-28 DIAGNOSIS — Z8679 Personal history of other diseases of the circulatory system: Secondary | ICD-10-CM

## 2021-04-28 DIAGNOSIS — Z122 Encounter for screening for malignant neoplasm of respiratory organs: Secondary | ICD-10-CM | POA: Diagnosis not present

## 2021-04-28 DIAGNOSIS — R69 Illness, unspecified: Secondary | ICD-10-CM | POA: Diagnosis not present

## 2021-04-29 ENCOUNTER — Telehealth: Payer: Self-pay | Admitting: Family Medicine

## 2021-04-29 DIAGNOSIS — R911 Solitary pulmonary nodule: Secondary | ICD-10-CM

## 2021-04-29 NOTE — Telephone Encounter (Signed)
I discussed both the CT head and Lungs result with the patient.  Unfortunately, her CT lung is suspicious for malignancy. Referral to Pulm discussed. She does not have any questions at this time. I advised, she can contact me anytime, should she require additional information.

## 2021-05-10 ENCOUNTER — Ambulatory Visit (HOSPITAL_COMMUNITY): Payer: Medicare HMO

## 2021-05-13 ENCOUNTER — Ambulatory Visit: Payer: Medicare HMO | Admitting: Family Medicine

## 2021-05-16 ENCOUNTER — Ambulatory Visit: Payer: Medicare HMO | Admitting: Pulmonary Disease

## 2021-05-16 ENCOUNTER — Encounter: Payer: Self-pay | Admitting: Pulmonary Disease

## 2021-05-16 ENCOUNTER — Other Ambulatory Visit: Payer: Self-pay

## 2021-05-16 ENCOUNTER — Telehealth: Payer: Self-pay | Admitting: Pulmonary Disease

## 2021-05-16 VITALS — BP 112/60 | HR 62 | Ht 67.0 in | Wt 132.0 lb

## 2021-05-16 DIAGNOSIS — R69 Illness, unspecified: Secondary | ICD-10-CM | POA: Diagnosis not present

## 2021-05-16 DIAGNOSIS — R599 Enlarged lymph nodes, unspecified: Secondary | ICD-10-CM | POA: Diagnosis not present

## 2021-05-16 DIAGNOSIS — R918 Other nonspecific abnormal finding of lung field: Secondary | ICD-10-CM | POA: Diagnosis not present

## 2021-05-16 DIAGNOSIS — F17200 Nicotine dependence, unspecified, uncomplicated: Secondary | ICD-10-CM | POA: Diagnosis not present

## 2021-05-16 DIAGNOSIS — C3412 Malignant neoplasm of upper lobe, left bronchus or lung: Secondary | ICD-10-CM | POA: Diagnosis present

## 2021-05-16 NOTE — H&P (View-Only) (Signed)
Synopsis: Referred in July 2022 for abnormal ct chest by [name], MD  Subjective:   PATIENT ID: Jill [name] GENDER: female DOB: [birth year], MRN: [id number]  Chief Complaint  Patient presents with   Abnormal CT imaging      This is a 70 year old female, current smoker, since age 62, currently smoking 1 pack/day.  She has medical history of hypertension, hyperlipidemia, UTI, coronary artery disease, MI, stent to the RCA with CABG in 2008.  She presents today after having a lung cancer screening CT. this was completed on 04/28/2021.  Lung cancer screening CT revealed a large 3.1 cm left upper lobe mass with associated mediastinal adenopathy however small in size.  This was concerning for primary bronchogenic carcinoma.  Patient was referred for evaluation.  From respiratory standpoint she is still smoking.  She does have cough and sputum production.  She is not that significantly breathless but is able to complete her activities of daily living.    Past Medical History:  Diagnosis Date   Anxiety    ON PAXIL, XANAX   AVM (arteriovenous malformation) brain    s/p stent/coil   Blood transfusion    Coronary artery disease    Prior inferior MI with stent to RCA, s/p CABG in 2008   Dizziness    Fatigue    Foot injury 06/01/2017   GERD (gastroesophageal reflux disease)    Headache(784.0)    UNRUPTURED CEREBRAL ANEURYSM   Hyperlipidemia    Hypertension    Hypothyroidism    Infected cyst of skin 09/18/2013   Left knee pain 06/05/2012   Myocardial infarction [facility])    Normal nuclear stress test Ju;y 2012   No ischemia. EF 70%; fixed defect involving septum, inferoseptal and inferior wall   Pain, dental 02/06/2017   Retroperitoneal bleeding    Following cardiac cath   Tobacco abuse    Urine discoloration 09/18/2013   UTI (lower urinary tract infection) 10/16/2013     Family History  Problem Relation Age of Onset   Cancer Mother 7   Diabetes Mother    Alcohol abuse Father     Asthma Father    Diabetes Brother    Lung cancer Brother    Ovarian cancer Maternal Aunt    Liver cancer Paternal Aunt    Heart disease Neg Hx      Past Surgical History:  Procedure Laterality Date   CARDIAC CATHETERIZATION  01/02/2007   IT REVEALS MILD INFERIOR WALL HYPOKINESIS. THE EJECTION FRACTION IS AROUND 50%   CORONARY ARTERY BYPASS GRAFT  2008   LIMA to LAD, SVG to DX, SVG to LCX & SVG to OM 1 & 2, and SVG to PD   CORONARY STENT PLACEMENT     Remote past stent to RCA   [place]   VENTRICULOSTOMY  10/06/2011   Procedure: VENTRICULOSTOMY;  Surgeon: Winfield Cunas;  Location: [facility];  Service: Neurosurgery;  Laterality: Right;  Insertion of Ventriculostomy Catheter    Social History   Socioeconomic History   Marital status: Married    Spouse name: Not on file   Number of children: Not on file   Years of education: Not on file   Highest education level: Not on file  Occupational History   Not on file  Tobacco Use   Smoking status: Every Day    Packs/day: 1.50    Years: 58.00    Pack years: 87.00    Types: Cigarettes    Start  date: 11/06/1957   Smokeless tobacco: Never   Tobacco comments:    Has smoked 3 ppd, current 1.5-2 ppd  Substance and Sexual Activity   Alcohol use: No   Drug use: No   Sexual activity: Yes    Birth control/protection: Post-menopausal  Other Topics Concern   Not on file  Social History Writer.  Some college.  Currently retired.  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 worked in [employer] most recently.      Lives with husband and 2 adult daughters and 4 grandchildren.   Social Determinants of Health   Financial Resource Strain: Not on file  Food Insecurity: Not on file  Transportation Needs: Not on file  Physical Activity: Not on file  Stress: Not on file  Social Connections: Not on file  Intimate Partner Violence: Not on file     Allergies  Allergen Reactions    Varenicline Other (See Comments)    Reports change in mood with increased irritability and homicidal ideation.  2007    Ace Inhibitors Cough   Prednisone Other (See Comments)    Crying, disorientation - occurred in 1994 Rage   Penicillins Rash    Reaction: Childhood   Sulfa Drugs Cross Reactors Rash     Outpatient Medications Prior to Visit  Medication Sig Dispense Refill   albuterol (VENTOLIN HFA) 108 (90 Base) MCG/ACT inhaler Inhale 2 puffs into the lungs every 6 (six) hours as needed for wheezing or shortness of breath. (Patient not taking: No sig reported) 18 g 3   amLODipine (NORVASC) 10 MG tablet Take 1 tablet by mouth once daily (Patient taking differently: Take 10 mg by mouth daily.) 90 tablet 0   aspirin EC 81 MG tablet Take 1 tablet (81 mg total) by mouth daily. 90 tablet 2   atorvastatin (LIPITOR) 40 MG tablet Take 1 tablet (40 mg total) by mouth daily. 90 tablet 1   Cholecalciferol (VITAMIN D3) 250 MCG (10000 UT) capsule Take 10,000 mcg by mouth daily.     escitalopram (LEXAPRO) 10 MG tablet Take 1 tablet by mouth once daily (Patient taking differently: Take 10 mg by mouth daily.) 90 tablet 0   levothyroxine (SYNTHROID) 137 MCG tablet Take 1 tablet (137 mcg total) by mouth daily. 30 tablet 1   losartan (COZAAR) 100 MG tablet Take 1 tablet by mouth once daily (Patient taking differently: 100 mg daily.) 90 tablet 0   metoprolol succinate (TOPROL-XL) 50 MG 24 hr tablet TAKE 1 TABLET BY MOUTH ONCE DAILY.  TAKE  WITH  OR  IMMEDIATELY  FOLLOWING  A  MEAL. (Patient taking differently: Take 50 mg by mouth daily. TAKE  WITH  OR  IMMEDIATELY  FOLLOWING  A  MEAL.) 90 tablet 1   montelukast (SINGULAIR) 10 MG tablet Take 1 tablet (10 mg total) by mouth at bedtime. (Patient not taking: No sig reported) 30 tablet 3   nitroGLYCERIN (NITROSTAT) 0.4 MG SL tablet Place 1 tablet (0.4 mg total) under the tongue every 5 (five) minutes as needed. For chest pain 25 tablet 6   Omega-3 Fatty Acids (FISH  OIL) 1000 MG CAPS Take 2 capsules (2,000 mg total) by mouth in the morning and at bedtime. (Patient taking differently: Take 1,000 mg by mouth daily.) 120 capsule 1   omeprazole (PRILOSEC) 20 MG capsule Take 1 capsule (20 mg total) by mouth daily. Prolonged use may damage the kidney (Patient taking differently: Take 20 mg by mouth daily as needed (  Heartburn). Prolonged use may damage the kidney) 90 capsule 1   diclofenac sodium (VOLTAREN) 1 % GEL Apply 4 g topically 4 (four) times daily. (Patient not taking: Reported on 05/16/2021) 1 Tube 0   budesonide-formoterol (SYMBICORT) 80-4.5 MCG/ACT inhaler Inhale 2 puffs into the lungs 2 (two) times daily. (Patient not taking: No sig reported) 1 Inhaler 2   No facility-administered medications prior to visit.    Review of Systems  Constitutional:  Negative for chills, fever, malaise/fatigue and weight loss.  HENT:  Negative for hearing loss, sore throat and tinnitus.   Eyes:  Negative for blurred vision and double vision.  Respiratory:  Positive for cough and shortness of breath. Negative for hemoptysis, sputum production, wheezing and stridor.   Cardiovascular:  Negative for chest pain, palpitations, orthopnea, leg swelling and PND.  Gastrointestinal:  Negative for abdominal pain, constipation, diarrhea, heartburn, nausea and vomiting.  Genitourinary:  Negative for dysuria, hematuria and urgency.  Musculoskeletal:  Negative for joint pain and myalgias.  Skin:  Negative for itching and rash.  Neurological:  Negative for dizziness, tingling, weakness and headaches.  Endo/Heme/Allergies:  Negative for environmental allergies. Does not bruise/bleed easily.  Psychiatric/Behavioral:  Negative for depression. The patient is not nervous/anxious and does not have insomnia.   All other systems reviewed and are negative.   Objective:  Physical Exam Vitals reviewed.  Constitutional:      General: She is not in acute distress.    Appearance: She is  well-developed.  HENT:     Head: Normocephalic and atraumatic.     Mouth/Throat:     Pharynx: No oropharyngeal exudate.  Eyes:     Conjunctiva/sclera: Conjunctivae normal.     Pupils: Pupils are equal, round, and reactive to light.  Neck:     Vascular: No JVD.     Trachea: No tracheal deviation.     Comments: Loss of supraclavicular fat Cardiovascular:     Rate and Rhythm: Normal rate and regular rhythm.     Heart sounds: S1 normal and S2 normal.     Comments: Distant heart tones Pulmonary:     Effort: No tachypnea or accessory muscle usage.     Breath sounds: No stridor. Decreased breath sounds (throughout all lung fields) present. No wheezing, rhonchi or rales.  Abdominal:     General: Bowel sounds are normal. There is no distension.     Palpations: Abdomen is soft.     Tenderness: There is no abdominal tenderness.  Musculoskeletal:        General: Deformity (muscle wasting ) present.  Skin:    General: Skin is warm and dry.     Capillary Refill: Capillary refill takes less than 2 seconds.     Findings: No rash.  Neurological:     Mental Status: She is alert and oriented to person, place, and time.  Psychiatric:        Behavior: Behavior normal.     Vitals:   05/16/21 1448  BP: 112/60  Pulse: 62  SpO2: 95%  Weight: 132 lb (59.9 kg)  Height: 5\' 7"  (1.702 m)   95% on RA BMI Readings from Last 3 Encounters:  05/16/21 20.67 kg/m  04/12/21 20.89 kg/m  08/05/20 25.12 kg/m   Wt Readings from Last 3 Encounters:  05/16/21 132 lb (59.9 kg)  04/12/21 133 lb 6.4 oz (60.5 kg)  08/05/20 160 lb 6.4 oz (72.8 kg)     CBC    Component Value Date/Time   WBC 7.9 04/13/2021 0944  WBC 8.7 08/16/2015 1445   RBC 4.40 04/13/2021 0944   RBC 4.79 08/16/2015 1445   HGB 13.6 04/13/2021 0944   HCT 40.8 04/13/2021 0944   PLT 328 04/13/2021 0944   MCV 93 04/13/2021 0944   MCH 30.9 04/13/2021 0944   MCH 31.9 08/16/2015 1445   MCHC 33.3 04/13/2021 0944   MCHC 33.4  08/16/2015 1445   RDW 11.8 04/13/2021 0944   LYMPHSABS 2.8 04/13/2021 0944   MONOABS 0.7 04/25/2012 0712   EOSABS 0.2 04/13/2021 0944   BASOSABS 0.1 04/13/2021 0944    Chest Imaging: 04/28/2021 lung cancer screening CT: 3 cm left upper lobe mass concerning for primary bronchogenic carcinoma. The patient's images have been independently reviewed by me.    Pulmonary Functions Testing Results: No flowsheet data found.  FeNO:   Pathology:   Echocardiogram:   Heart Catheterization:     Assessment & Plan:     ICD-10-CM   1. Mass of left lung  R91.8 Ambulatory referral to Pulmonology    Procedural/ Surgical Case Request: VIDEO BRONCHOSCOPY WITH ENDOBRONCHIAL NAVIGATION, VIDEO BRONCHOSCOPY WITH ENDOBRONCHIAL ULTRASOUND    CT Super D Chest Wo Contrast    2. Adenopathy  R59.9     3. Current smoker  F17.200       Discussion:  70 year old female, current smoker, large left upper lobe 3 cm lesion concerning for primary bronchogenic carcinoma.  There is also small paratracheal adenopathy and subcarinal adenopathy.  If this was also positive we would be concerning for radiographic findings of a potential stage III lung cancer.  Plan: We discussed these risks today in the office. I thinkthat would be including navigational bronchoscopy with tissue sampling as well as video bronchoscopy the endobronchial ultrasound transbronchial needle aspirations of the mediastinal adenopathy for staging. We discussed the risk of bleeding and pneumothorax Patient is agreeable to proceed with this.  We reviewed images today in the office.  We will tentatively schedule for 05/20/2021. We appreciate PCC's help with coordinating scheduling.    Current Outpatient Medications:    albuterol (VENTOLIN HFA) 108 (90 Base) MCG/ACT inhaler, Inhale 2 puffs into the lungs every 6 (six) hours as needed for wheezing or shortness of breath. (Patient not taking: No sig reported), Disp: 18 g, Rfl: 3   amLODipine  (NORVASC) 10 MG tablet, Take 1 tablet by mouth once daily (Patient taking differently: Take 10 mg by mouth daily.), Disp: 90 tablet, Rfl: 0   aspirin EC 81 MG tablet, Take 1 tablet (81 mg total) by mouth daily., Disp: 90 tablet, Rfl: 2   atorvastatin (LIPITOR) 40 MG tablet, Take 1 tablet (40 mg total) by mouth daily., Disp: 90 tablet, Rfl: 1   Cholecalciferol (VITAMIN D3) 250 MCG (10000 UT) capsule, Take 10,000 mcg by mouth daily., Disp: , Rfl:    escitalopram (LEXAPRO) 10 MG tablet, Take 1 tablet by mouth once daily (Patient taking differently: Take 10 mg by mouth daily.), Disp: 90 tablet, Rfl: 0   levothyroxine (SYNTHROID) 137 MCG tablet, Take 1 tablet (137 mcg total) by mouth daily., Disp: 30 tablet, Rfl: 1   losartan (COZAAR) 100 MG tablet, Take 1 tablet by mouth once daily (Patient taking differently: 100 mg daily.), Disp: 90 tablet, Rfl: 0   metoprolol succinate (TOPROL-XL) 50 MG 24 hr tablet, TAKE 1 TABLET BY MOUTH ONCE DAILY.  TAKE  WITH  OR  IMMEDIATELY  FOLLOWING  A  MEAL. (Patient taking differently: Take 50 mg by mouth daily. TAKE  WITH  OR  IMMEDIATELY  FOLLOWING  A  MEAL.), Disp: 90 tablet, Rfl: 1   montelukast (SINGULAIR) 10 MG tablet, Take 1 tablet (10 mg total) by mouth at bedtime. (Patient not taking: No sig reported), Disp: 30 tablet, Rfl: 3   nitroGLYCERIN (NITROSTAT) 0.4 MG SL tablet, Place 1 tablet (0.4 mg total) under the tongue every 5 (five) minutes as needed. For chest pain, Disp: 25 tablet, Rfl: 6   Omega-3 Fatty Acids (FISH OIL) 1000 MG CAPS, Take 2 capsules (2,000 mg total) by mouth in the morning and at bedtime. (Patient taking differently: Take 1,000 mg by mouth daily.), Disp: 120 capsule, Rfl: 1   omeprazole (PRILOSEC) 20 MG capsule, Take 1 capsule (20 mg total) by mouth daily. Prolonged use may damage the kidney (Patient taking differently: Take 20 mg by mouth daily as needed (Heartburn). Prolonged use may damage the kidney), Disp: 90 capsule, Rfl: 1    budesonide-formoterol (SYMBICORT) 80-4.5 MCG/ACT inhaler, Inhale 2 puffs into the lungs 2 (two) times daily. (Patient not taking: No sig reported), Disp: 1 Inhaler, Rfl: 2   diphenhydramine-acetaminophen (TYLENOL PM) 25-500 MG TABS tablet, Take 1 tablet by mouth every other day. At bedtime, Disp: , Rfl:    Multiple Vitamins-Minerals (MULTIVITAMIN WITH MINERALS) tablet, Take 1 tablet by mouth daily., Disp: , Rfl:    Vitamin A 2400 MCG (8000 UT) TABS, Take 2,400 mg by mouth daily., Disp: , Rfl:    vitamin B-12 (CYANOCOBALAMIN) 1000 MCG tablet, Take 1,000 mcg by mouth daily., Disp: , Rfl:   I spent 62 minutes dedicated to the care of this patient on the date of this encounter to include pre-visit review of records, face-to-face time with the patient discussing conditions above, post visit ordering of testing, clinical documentation with the electronic health record, making appropriate referrals as documented, and communicating necessary findings to members of the patients care team.   Garner Nash, [facility] 05/16/2021 6:36 PM

## 2021-05-16 NOTE — Patient Instructions (Signed)
Thank you for visiting Dr. Valeta Harms at [facility]. Today we recommend the following:  Orders Placed This Encounter  Procedures   Procedural/ Surgical Case Request: VIDEO BRONCHOSCOPY WITH ENDOBRONCHIAL NAVIGATION, VIDEO BRONCHOSCOPY WITH ENDOBRONCHIAL ULTRASOUND   CT Super D Chest Wo Contrast   Ambulatory referral to Pulmonology   Tentative date: 05/20/2021 Bronchoscopy   Return in about 5 weeks (around 06/20/2021) for with APP or Dr. Valeta Harms.    Please do your part to reduce the spread of COVID-19.

## 2021-05-16 NOTE — Progress Notes (Signed)
Synopsis: Referred in July 2022 for abnormal ct chest by [name], MD  Subjective:   PATIENT ID: Jill [name] GENDER: female DOB: [birth year], MRN: [id number]  Chief Complaint  Patient presents with   Abnormal CT imaging      This is a 70 year old female, current smoker, since age 71, currently smoking 1 pack/day.  She has medical history of hypertension, hyperlipidemia, UTI, coronary artery disease, MI, stent to the RCA with CABG in 2008.  She presents today after having a lung cancer screening CT. this was completed on 04/28/2021.  Lung cancer screening CT revealed a large 3.1 cm left upper lobe mass with associated mediastinal adenopathy however small in size.  This was concerning for primary bronchogenic carcinoma.  Patient was referred for evaluation.  From respiratory standpoint she is still smoking.  She does have cough and sputum production.  She is not that significantly breathless but is able to complete her activities of daily living.    Past Medical History:  Diagnosis Date   Anxiety    ON PAXIL, XANAX   AVM (arteriovenous malformation) brain    s/p stent/coil   Blood transfusion    Coronary artery disease    Prior inferior MI with stent to RCA, s/p CABG in 2008   Dizziness    Fatigue    Foot injury 06/01/2017   GERD (gastroesophageal reflux disease)    Headache(784.0)    UNRUPTURED CEREBRAL ANEURYSM   Hyperlipidemia    Hypertension    Hypothyroidism    Infected cyst of skin 09/18/2013   Left knee pain 06/05/2012   Myocardial infarction [facility])    Normal nuclear stress test Ju;y 2012   No ischemia. EF 70%; fixed defect involving septum, inferoseptal and inferior wall   Pain, dental 02/06/2017   Retroperitoneal bleeding    Following cardiac cath   Tobacco abuse    Urine discoloration 09/18/2013   UTI (lower urinary tract infection) 10/16/2013     Family History  Problem Relation Age of Onset   Cancer Mother 69   Diabetes Mother    Alcohol abuse Father     Asthma Father    Diabetes Brother    Lung cancer Brother    Ovarian cancer Maternal Aunt    Liver cancer Paternal Aunt    Heart disease Neg Hx      Past Surgical History:  Procedure Laterality Date   CARDIAC CATHETERIZATION  01/02/2007   IT REVEALS MILD INFERIOR WALL HYPOKINESIS. THE EJECTION FRACTION IS AROUND 50%   CORONARY ARTERY BYPASS GRAFT  2008   LIMA to LAD, SVG to DX, SVG to LCX & SVG to OM 1 & 2, and SVG to PD   CORONARY STENT PLACEMENT     Remote past stent to RCA   [place]   VENTRICULOSTOMY  10/06/2011   Procedure: VENTRICULOSTOMY;  Surgeon: Winfield Cunas;  Location: [facility];  Service: Neurosurgery;  Laterality: Right;  Insertion of Ventriculostomy Catheter    Social History   Socioeconomic History   Marital status: Married    Spouse name: Not on file   Number of children: Not on file   Years of education: Not on file   Highest education level: Not on file  Occupational History   Not on file  Tobacco Use   Smoking status: Every Day    Packs/day: 1.50    Years: 58.00    Pack years: 87.00    Types: Cigarettes    Start  date: 11/06/1957   Smokeless tobacco: Never   Tobacco comments:    Has smoked 3 ppd, current 1.5-2 ppd  Substance and Sexual Activity   Alcohol use: No   Drug use: No   Sexual activity: Yes    Birth control/protection: Post-menopausal  Other Topics Concern   Not on file  Social History Writer.  Some college.  Currently retired.  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 worked in [employer] most recently.      Lives with husband and 2 adult daughters and 4 grandchildren.   Social Determinants of Health   Financial Resource Strain: Not on file  Food Insecurity: Not on file  Transportation Needs: Not on file  Physical Activity: Not on file  Stress: Not on file  Social Connections: Not on file  Intimate Partner Violence: Not on file     Allergies  Allergen Reactions    Varenicline Other (See Comments)    Reports change in mood with increased irritability and homicidal ideation.  2007    Ace Inhibitors Cough   Prednisone Other (See Comments)    Crying, disorientation - occurred in 1994 Rage   Penicillins Rash    Reaction: Childhood   Sulfa Drugs Cross Reactors Rash     Outpatient Medications Prior to Visit  Medication Sig Dispense Refill   albuterol (VENTOLIN HFA) 108 (90 Base) MCG/ACT inhaler Inhale 2 puffs into the lungs every 6 (six) hours as needed for wheezing or shortness of breath. (Patient not taking: No sig reported) 18 g 3   amLODipine (NORVASC) 10 MG tablet Take 1 tablet by mouth once daily (Patient taking differently: Take 10 mg by mouth daily.) 90 tablet 0   aspirin EC 81 MG tablet Take 1 tablet (81 mg total) by mouth daily. 90 tablet 2   atorvastatin (LIPITOR) 40 MG tablet Take 1 tablet (40 mg total) by mouth daily. 90 tablet 1   Cholecalciferol (VITAMIN D3) 250 MCG (10000 UT) capsule Take 10,000 mcg by mouth daily.     escitalopram (LEXAPRO) 10 MG tablet Take 1 tablet by mouth once daily (Patient taking differently: Take 10 mg by mouth daily.) 90 tablet 0   levothyroxine (SYNTHROID) 137 MCG tablet Take 1 tablet (137 mcg total) by mouth daily. 30 tablet 1   losartan (COZAAR) 100 MG tablet Take 1 tablet by mouth once daily (Patient taking differently: 100 mg daily.) 90 tablet 0   metoprolol succinate (TOPROL-XL) 50 MG 24 hr tablet TAKE 1 TABLET BY MOUTH ONCE DAILY.  TAKE  WITH  OR  IMMEDIATELY  FOLLOWING  A  MEAL. (Patient taking differently: Take 50 mg by mouth daily. TAKE  WITH  OR  IMMEDIATELY  FOLLOWING  A  MEAL.) 90 tablet 1   montelukast (SINGULAIR) 10 MG tablet Take 1 tablet (10 mg total) by mouth at bedtime. (Patient not taking: No sig reported) 30 tablet 3   nitroGLYCERIN (NITROSTAT) 0.4 MG SL tablet Place 1 tablet (0.4 mg total) under the tongue every 5 (five) minutes as needed. For chest pain 25 tablet 6   Omega-3 Fatty Acids (FISH  OIL) 1000 MG CAPS Take 2 capsules (2,000 mg total) by mouth in the morning and at bedtime. (Patient taking differently: Take 1,000 mg by mouth daily.) 120 capsule 1   omeprazole (PRILOSEC) 20 MG capsule Take 1 capsule (20 mg total) by mouth daily. Prolonged use may damage the kidney (Patient taking differently: Take 20 mg by mouth daily as needed (  Heartburn). Prolonged use may damage the kidney) 90 capsule 1   diclofenac sodium (VOLTAREN) 1 % GEL Apply 4 g topically 4 (four) times daily. (Patient not taking: Reported on 05/16/2021) 1 Tube 0   budesonide-formoterol (SYMBICORT) 80-4.5 MCG/ACT inhaler Inhale 2 puffs into the lungs 2 (two) times daily. (Patient not taking: No sig reported) 1 Inhaler 2   No facility-administered medications prior to visit.    Review of Systems  Constitutional:  Negative for chills, fever, malaise/fatigue and weight loss.  HENT:  Negative for hearing loss, sore throat and tinnitus.   Eyes:  Negative for blurred vision and double vision.  Respiratory:  Positive for cough and shortness of breath. Negative for hemoptysis, sputum production, wheezing and stridor.   Cardiovascular:  Negative for chest pain, palpitations, orthopnea, leg swelling and PND.  Gastrointestinal:  Negative for abdominal pain, constipation, diarrhea, heartburn, nausea and vomiting.  Genitourinary:  Negative for dysuria, hematuria and urgency.  Musculoskeletal:  Negative for joint pain and myalgias.  Skin:  Negative for itching and rash.  Neurological:  Negative for dizziness, tingling, weakness and headaches.  Endo/Heme/Allergies:  Negative for environmental allergies. Does not bruise/bleed easily.  Psychiatric/Behavioral:  Negative for depression. The patient is not nervous/anxious and does not have insomnia.   All other systems reviewed and are negative.   Objective:  Physical Exam Vitals reviewed.  Constitutional:      General: She is not in acute distress.    Appearance: She is  well-developed.  HENT:     Head: Normocephalic and atraumatic.     Mouth/Throat:     Pharynx: No oropharyngeal exudate.  Eyes:     Conjunctiva/sclera: Conjunctivae normal.     Pupils: Pupils are equal, round, and reactive to light.  Neck:     Vascular: No JVD.     Trachea: No tracheal deviation.     Comments: Loss of supraclavicular fat Cardiovascular:     Rate and Rhythm: Normal rate and regular rhythm.     Heart sounds: S1 normal and S2 normal.     Comments: Distant heart tones Pulmonary:     Effort: No tachypnea or accessory muscle usage.     Breath sounds: No stridor. Decreased breath sounds (throughout all lung fields) present. No wheezing, rhonchi or rales.  Abdominal:     General: Bowel sounds are normal. There is no distension.     Palpations: Abdomen is soft.     Tenderness: There is no abdominal tenderness.  Musculoskeletal:        General: Deformity (muscle wasting ) present.  Skin:    General: Skin is warm and dry.     Capillary Refill: Capillary refill takes less than 2 seconds.     Findings: No rash.  Neurological:     Mental Status: She is alert and oriented to person, place, and time.  Psychiatric:        Behavior: Behavior normal.     Vitals:   05/16/21 1448  BP: 112/60  Pulse: 62  SpO2: 95%  Weight: 132 lb (59.9 kg)  Height: 5\' 7"  (1.702 m)   95% on RA BMI Readings from Last 3 Encounters:  05/16/21 20.67 kg/m  04/12/21 20.89 kg/m  08/05/20 25.12 kg/m   Wt Readings from Last 3 Encounters:  05/16/21 132 lb (59.9 kg)  04/12/21 133 lb 6.4 oz (60.5 kg)  08/05/20 160 lb 6.4 oz (72.8 kg)     CBC    Component Value Date/Time   WBC 7.9 04/13/2021 0944  WBC 8.7 08/16/2015 1445   RBC 4.40 04/13/2021 0944   RBC 4.79 08/16/2015 1445   HGB 13.6 04/13/2021 0944   HCT 40.8 04/13/2021 0944   PLT 328 04/13/2021 0944   MCV 93 04/13/2021 0944   MCH 30.9 04/13/2021 0944   MCH 31.9 08/16/2015 1445   MCHC 33.3 04/13/2021 0944   MCHC 33.4  08/16/2015 1445   RDW 11.8 04/13/2021 0944   LYMPHSABS 2.8 04/13/2021 0944   MONOABS 0.7 04/25/2012 0712   EOSABS 0.2 04/13/2021 0944   BASOSABS 0.1 04/13/2021 0944    Chest Imaging: 04/28/2021 lung cancer screening CT: 3 cm left upper lobe mass concerning for primary bronchogenic carcinoma. The patient's images have been independently reviewed by me.    Pulmonary Functions Testing Results: No flowsheet data found.  FeNO:   Pathology:   Echocardiogram:   Heart Catheterization:     Assessment & Plan:     ICD-10-CM   1. Mass of left lung  R91.8 Ambulatory referral to Pulmonology    Procedural/ Surgical Case Request: VIDEO BRONCHOSCOPY WITH ENDOBRONCHIAL NAVIGATION, VIDEO BRONCHOSCOPY WITH ENDOBRONCHIAL ULTRASOUND    CT Super D Chest Wo Contrast    2. Adenopathy  R59.9     3. Current smoker  F17.200       Discussion:  70 year old female, current smoker, large left upper lobe 3 cm lesion concerning for primary bronchogenic carcinoma.  There is also small paratracheal adenopathy and subcarinal adenopathy.  If this was also positive we would be concerning for radiographic findings of a potential stage III lung cancer.  Plan: We discussed these risks today in the office. I thinkthat would be including navigational bronchoscopy with tissue sampling as well as video bronchoscopy the endobronchial ultrasound transbronchial needle aspirations of the mediastinal adenopathy for staging. We discussed the risk of bleeding and pneumothorax Patient is agreeable to proceed with this.  We reviewed images today in the office.  We will tentatively schedule for 05/20/2021. We appreciate PCC's help with coordinating scheduling.    Current Outpatient Medications:    albuterol (VENTOLIN HFA) 108 (90 Base) MCG/ACT inhaler, Inhale 2 puffs into the lungs every 6 (six) hours as needed for wheezing or shortness of breath. (Patient not taking: No sig reported), Disp: 18 g, Rfl: 3   amLODipine  (NORVASC) 10 MG tablet, Take 1 tablet by mouth once daily (Patient taking differently: Take 10 mg by mouth daily.), Disp: 90 tablet, Rfl: 0   aspirin EC 81 MG tablet, Take 1 tablet (81 mg total) by mouth daily., Disp: 90 tablet, Rfl: 2   atorvastatin (LIPITOR) 40 MG tablet, Take 1 tablet (40 mg total) by mouth daily., Disp: 90 tablet, Rfl: 1   Cholecalciferol (VITAMIN D3) 250 MCG (10000 UT) capsule, Take 10,000 mcg by mouth daily., Disp: , Rfl:    escitalopram (LEXAPRO) 10 MG tablet, Take 1 tablet by mouth once daily (Patient taking differently: Take 10 mg by mouth daily.), Disp: 90 tablet, Rfl: 0   levothyroxine (SYNTHROID) 137 MCG tablet, Take 1 tablet (137 mcg total) by mouth daily., Disp: 30 tablet, Rfl: 1   losartan (COZAAR) 100 MG tablet, Take 1 tablet by mouth once daily (Patient taking differently: 100 mg daily.), Disp: 90 tablet, Rfl: 0   metoprolol succinate (TOPROL-XL) 50 MG 24 hr tablet, TAKE 1 TABLET BY MOUTH ONCE DAILY.  TAKE  WITH  OR  IMMEDIATELY  FOLLOWING  A  MEAL. (Patient taking differently: Take 50 mg by mouth daily. TAKE  WITH  OR  IMMEDIATELY  FOLLOWING  A  MEAL.), Disp: 90 tablet, Rfl: 1   montelukast (SINGULAIR) 10 MG tablet, Take 1 tablet (10 mg total) by mouth at bedtime. (Patient not taking: No sig reported), Disp: 30 tablet, Rfl: 3   nitroGLYCERIN (NITROSTAT) 0.4 MG SL tablet, Place 1 tablet (0.4 mg total) under the tongue every 5 (five) minutes as needed. For chest pain, Disp: 25 tablet, Rfl: 6   Omega-3 Fatty Acids (FISH OIL) 1000 MG CAPS, Take 2 capsules (2,000 mg total) by mouth in the morning and at bedtime. (Patient taking differently: Take 1,000 mg by mouth daily.), Disp: 120 capsule, Rfl: 1   omeprazole (PRILOSEC) 20 MG capsule, Take 1 capsule (20 mg total) by mouth daily. Prolonged use may damage the kidney (Patient taking differently: Take 20 mg by mouth daily as needed (Heartburn). Prolonged use may damage the kidney), Disp: 90 capsule, Rfl: 1    budesonide-formoterol (SYMBICORT) 80-4.5 MCG/ACT inhaler, Inhale 2 puffs into the lungs 2 (two) times daily. (Patient not taking: No sig reported), Disp: 1 Inhaler, Rfl: 2   diphenhydramine-acetaminophen (TYLENOL PM) 25-500 MG TABS tablet, Take 1 tablet by mouth every other day. At bedtime, Disp: , Rfl:    Multiple Vitamins-Minerals (MULTIVITAMIN WITH MINERALS) tablet, Take 1 tablet by mouth daily., Disp: , Rfl:    Vitamin A 2400 MCG (8000 UT) TABS, Take 2,400 mg by mouth daily., Disp: , Rfl:    vitamin B-12 (CYANOCOBALAMIN) 1000 MCG tablet, Take 1,000 mcg by mouth daily., Disp: , Rfl:   I spent 62 minutes dedicated to the care of this patient on the date of this encounter to include pre-visit review of records, face-to-face time with the patient discussing conditions above, post visit ordering of testing, clinical documentation with the electronic health record, making appropriate referrals as documented, and communicating necessary findings to members of the patients care team.   Garner Nash, [facility] 05/16/2021 6:36 PM

## 2021-05-16 NOTE — Telephone Encounter (Signed)
Pt informed of the following:  CT @ [facility] on 7/13 at 10:00am  Covid Test 7/13 at 12:00pm  [facility] 7/15 at 10:00am; 7:30am arrival time. (Case # [id number])

## 2021-05-17 ENCOUNTER — Other Ambulatory Visit (HOSPITAL_COMMUNITY): Payer: Medicare HMO

## 2021-05-17 NOTE — Telephone Encounter (Signed)
Noted.   Will route message to [facility] as FYI.   Nothing further needed at this time.

## 2021-05-18 ENCOUNTER — Other Ambulatory Visit: Payer: Self-pay

## 2021-05-18 ENCOUNTER — Ambulatory Visit (INDEPENDENT_AMBULATORY_CARE_PROVIDER_SITE_OTHER)
Admission: RE | Admit: 2021-05-18 | Discharge: 2021-05-18 | Disposition: A | Discharge status: Home / Self Care | Payer: Medicare HMO | Source: Ambulatory Visit | Attending: Pulmonary Disease | Admitting: Pulmonary Disease

## 2021-05-18 ENCOUNTER — Other Ambulatory Visit (HOSPITAL_COMMUNITY)
Admission: RE | Admit: 2021-05-18 | Discharge: 2021-05-18 | Disposition: A | Discharge status: Home / Self Care | Payer: Medicare HMO | Source: Ambulatory Visit | Attending: Pulmonary Disease | Admitting: Pulmonary Disease

## 2021-05-18 ENCOUNTER — Encounter (HOSPITAL_COMMUNITY): Payer: Self-pay | Admitting: Pulmonary Disease

## 2021-05-18 DIAGNOSIS — Z01812 Encounter for preprocedural laboratory examination: Secondary | ICD-10-CM | POA: Diagnosis not present

## 2021-05-18 DIAGNOSIS — R911 Solitary pulmonary nodule: Secondary | ICD-10-CM | POA: Diagnosis not present

## 2021-05-18 DIAGNOSIS — I700 Atherosclerosis of aorta: Secondary | ICD-10-CM | POA: Diagnosis not present

## 2021-05-18 DIAGNOSIS — Z20822 Contact with and (suspected) exposure to covid-19: Secondary | ICD-10-CM | POA: Diagnosis not present

## 2021-05-18 DIAGNOSIS — R918 Other nonspecific abnormal finding of lung field: Secondary | ICD-10-CM | POA: Diagnosis not present

## 2021-05-18 DIAGNOSIS — J439 Emphysema, unspecified: Secondary | ICD-10-CM | POA: Diagnosis not present

## 2021-05-18 LAB — SARS CORONAVIRUS 2 (TAT 6-24 HRS): SARS Coronavirus 2: NEGATIVE

## 2021-05-18 NOTE — Progress Notes (Signed)
DUE TO COVID-19 ONLY ONE VISITOR IS ALLOWED TO COME WITH YOU AND STAY IN THE WAITING ROOM ONLY DURING PRE OP AND PROCEDURE DAY OF SURGERY.   PCP - Dr Andrena Mews Cardiologist - n/a  CT Chest x-ray - 05/18/21 not resulted yet, 04/28/21 EKG - DOS 05/20/21 Stress Test - 05/14/11 ECHO -  Cardiac Cath - 01/02/07  Sleep Study -  n/a CPAP - none  Aspirin Instructions: Follow your surgeon's instructions on when to stop aspirin prior to surgery,  If no instructions were given by your surgeon then you will need to call the office for those instructions.  Anesthesia review: Yes  STOP now taking any Aspirin (unless otherwise instructed by your surgeon), Aleve, Naproxen, Ibuprofen, Motrin, Advil, Goody's, BC's, all herbal medications, fish oil, and all vitamins.   Coronavirus Screening Covid test on 05/18/21 - results pending Do you have any of the following symptoms:  Cough yes/no: Yes COPD Fever (>100.31F)  yes/no: No Runny nose yes/no: No Sore throat yes/no: No Difficulty breathing/shortness of breath  yes/no: Yes COPD  Have you traveled in the last 14 days and where? yes/no: No  Patient verbalized understanding of instructions that were given via phone.

## 2021-05-19 NOTE — Anesthesia Preprocedure Evaluation (Addendum)
Anesthesia Evaluation  Patient identified by MRN, date of birth, ID band Patient awake    Reviewed: Allergy & Precautions, NPO status , Patient's Chart, lab work & pertinent test results  Airway Mallampati: I  TM Distance: >3 FB Neck ROM: Full    Dental  (+) Poor Dentition, Chipped, Missing   Pulmonary COPD,  COPD inhaler, Current SmokerPatient did not abstain from smoking.,    Pulmonary exam normal breath sounds clear to auscultation       Cardiovascular hypertension, Pt. on medications and Pt. on home beta blockers + CAD, + Past MI, + Cardiac Stents and + CABG  Normal cardiovascular exam Rhythm:Regular Rate:Normal  ECG: NSR, rate 62   Neuro/Psych  Headaches, PSYCHIATRIC DISORDERS Anxiety Depression    GI/Hepatic Neg liver ROS, GERD  Medicated and Controlled,  Endo/Other  Hypothyroidism   Renal/GU negative Renal ROS     Musculoskeletal negative musculoskeletal ROS (+)   Abdominal   Peds  Hematology HLD   Anesthesia Other Findings Mass of left lung   Reproductive/Obstetrics                          Anesthesia Physical Anesthesia Plan  ASA: 3  Anesthesia Plan: General   Post-op Pain Management:    Induction: Intravenous  PONV Risk Score and Plan: 2 and Ondansetron, Dexamethasone and Treatment may vary due to age or medical condition  Airway Management Planned: Oral ETT  Additional Equipment:   Intra-op Plan:   Post-operative Plan: Extubation in OR  Informed Consent: I have reviewed the patients History and Physical, chart, labs and discussed the procedure including the risks, benefits and alternatives for the proposed anesthesia with the patient or authorized representative who has indicated his/her understanding and acceptance.     Dental advisory given  Plan Discussed with: CRNA  Anesthesia Plan Comments: (Reviewed PAT note written 05/19/2021 by Myra Gianotti, PA-C. )        Anesthesia Quick Evaluation

## 2021-05-19 NOTE — Progress Notes (Signed)
Anesthesia Chart Review: SAME DAY WORK-UP  Case: [id number] Date/Time: 05/20/21 1000   Procedures:      VIDEO BRONCHOSCOPY WITH ENDOBRONCHIAL NAVIGATION (Left)     VIDEO BRONCHOSCOPY WITH ENDOBRONCHIAL ULTRASOUND (Bilateral)   Anesthesia type: General   Diagnosis: Mass of left lung [R91.8]   Pre-op diagnosis: Lung mass   Location: [facility] OR ROOM 10 / [facility]   Surgeons: Garner Nash, DO       DISCUSSION: Patient is a 70 year old female scheduled for the above procedure. Patient seen by Dr. Valeta Harms on 05/16/21 for abnormal lung cancer screening CT on 04/28/21. Referred by her long-time PCP [name], MD. CT findings concerning for primary bronchogenic carcinoma involving LUL and LLL.   History includes smoking (since age 1, currently 1PPD), CAD (inferior MI, s/p RCA stent; CABG 02/06/07: "LIMA to the LAD, SVG to diagonal, SVG to circumflex, sequential OM1 and OM2, SVG to PDA"), LBBB, HLD, HTN, hypothyroidism, GERD, COPD, exertional dyspnea, basilar artery aneurysm (s/p stent assisted coiling 10/05/11 with interval development of hydrocephalus and ICH 10/06/11, s/p right frontal ventriculostomy, removed 10/19/11).     Last cardiology visit noted was with Dr. Acie Fredrickson on 05/22/13. He notes, "Giselle is doing well.  No CP .  Works out in the garden regularly.  Still smoking - still shortness of breath". One year follow-up had been planned, but she has not been back to see cardiology.   Since 2014 she has had ongoing follow-up with her PCP [name], MD, last visit 04/12/21 for comprehensive physical exam. Patient reported fainting spell in April 2002 while out gardening. She hit the right side of her head when she fell. She did not want to go to the ED, but was interested in brain imaging given her history of cerebral aneurysm. Dr. Laurian Brim also planned to get an echo given syncope of unclear etiology. No murmur noted on exam. She did report some weight loss. Ongoing smoking. Lung cancer screening chest  CT ordered. Has chronic daily cough. Uses albuterol as needed, as she could not afford Symbicort. COVID vaccine and PCV20 both administered. Unfortunately chest CT was concerning for lung cancer, so Dr. Laurian Brim referred her to pulmonology.  Her last stress test seen is from 05/14/11 and showed relatively fixed perfusion defects involving the septum, inferoseptal wall and inferior wall. No convincing inducible ischemia with Lexiscan administration. Normal ventricular function with evidence of septal hypokinesis and calculated ejection fraction of 70%.  Her EKG then showed a left BBB.    I could not locate her last cath and echo (TEE) in [place], but prior to her aneurysm coiling in 2012 I was able to review her cath reported from 01/02/07 and intraoperative TEE from 02/06/07. Per my previous note, "Her cath was done on 01/02/07 and prompted CT surgery referral for CABG.  Her TEE on 02/06/07 was intraoperative during her CABG and did not show any significant valvular disease.  EF was only 40% at that time."  Last labs are from 04/13/21. Last EKG is from 2016 and has known finding of LBBB. No reported chest pain but does have chronic cough. She is able to garden and per Dr. Valeta Harms, "She is not that significantly breathless but is able to complete her activities of daily living." She has not seen cardiology in 8 years, but has been followed by her PCP. Unfortunately now there is concern for left lung cancer. She needs bronchoscopy for tissues samples and definitive diagnosis to guide management. Discussed  with anesthesiologist Tamela Gammon, MD. Patient will be evaluated on the day of surgery. Definitive plan at that time. Staff message sent to Dr. Valeta Harms.   VS: Ht 5\' 7"  (1.702 m)   Wt 60.5 kg   BMI 20.89 kg/m  BP Readings from Last 3 Encounters:  05/16/21 112/60  04/12/21 116/66  08/05/20 130/70   Pulse Readings from Last 3 Encounters:  05/16/21 62  04/12/21 61  08/05/20 73     PROVIDERS: [name], MD is PCP  Mertie Moores, MD is cardiologist   LABS: Lab results from 04/12/21 include:  Lab Results  Component Value Date   WBC 7.9 04/13/2021   HGB 13.6 04/13/2021   HCT 40.8 04/13/2021   PLT 328 04/13/2021   GLUCOSE 103 (H) 04/12/2021   NA 141 04/12/2021   K 4.6 04/12/2021   CL 102 04/12/2021   CREATININE 0.95 04/12/2021   BUN 13 04/12/2021   CO2 22 04/12/2021   TSH 0.064 (L) 04/12/2021   HGBA1C 6.0 04/12/2021    IMAGES: CT Super D Chest 05/18/21: In process.   CT Chest Lung cancer screen 04/28/21: IMPRESSION: 1. Lung-RADS 4X, highly suspicious. Additional imaging evaluation or consultation with Pulmonology or Thoracic Surgery recommended. Spiculated left upper lobe mass with left upper and left lower lobe pulmonary nodules. Right paratracheal adenopathy and suspected left hilar adenopathy. These results will be called to the ordering clinician or representative by the Radiologist Assistant, and communication documented in the PACS or [employer]. 2.  Aortic atherosclerosis (ICD10-I70.0). 3.  Emphysema (ICD10-J43.9).  CT Head 04/28/21: IMPRESSION: No acute abnormality no change from the prior CT Prior stenting and coiling of basilar aneurysm. Mild chronic microvascular ischemic change in the white matter.   Cerebral Angiogram 04/25/12 Estanislado Pandy, Willaim Rayas, MD): IMPRESSION:  1.  Angiographically nearly completely obliterated basilar apex  aneurysm with a small neck remnant along the right inferior aspect  of the neck stable.  2.  Wide patency of the stented segment of the distal basilar  artery extending into the left posterior cerebral artery P1  segment.  3. Stable approximately 2.7 mm right internal carotid artery  superior hypophyseal region aneurysm.  4.  50% atherosclerotic related circumferential stenosis of the  right internal carotid caval segment.  5.  Mild atherosclerotic disease of the right internal carotid  artery bulb region with  approximately 20-30% stenosis.  RECOMMENDATIONS: Patient was again strongly advised to stop smoking as this would  potentially result in recanalization of the treated aneurysm,  and/or increase in size of the right-sided superior hypophyseal  region aneurysm.  A follow-up angiogram will be undertaken in  approximately 6 months' time.   EKG: Last EKG seen is from 08/16/15: Sinus rhythm IVCD, consider atypical LBBB no significant change since 2012 Confirmed by GOLDSTON MD, SCOTT ([id number]) on 08/16/2015 1:58:54 PM   CV:  Had echo ordered 04/12/21 but not done yet, see DISCUSSION.  Nuclear stress test 05/14/11: IMPRESSION: Relatively fixed perfusion defects involving the septum, inferoseptal wall and inferior wall. No convincing inducible ischemia with Lexiscan administration. Normal ventricular function with evidence of septal hypokinesis and calculated ejection fraction of 70%.   Past Medical History:  Diagnosis Date   Anxiety    ON PAXIL, XANAX   AVM (arteriovenous malformation) brain    s/p stent/coil   Blood transfusion    x 2   Cancer (HCC)    Left lung   COPD (chronic obstructive pulmonary disease) (HCC)    no inhaler, no oxygen  Coronary artery disease    Prior inferior MI with stent to RCA, s/p CABG in 2008   Depression    Dizziness    Dyspnea    with exertion, no oxygen   Fatigue    Foot injury 06/01/2017   right - RESOLVED, no longer an issue per patient 05/18/21   GERD (gastroesophageal reflux disease)    Headache(784.0)    UNRUPTURED CEREBRAL ANEURYSM   Hyperlipidemia    Hypertension    Hypothyroidism    Infected cyst of skin 09/18/2013   Left knee pain 06/05/2012   Lung mass    Left lung   Myocardial infarction [facility])    Normal nuclear stress test Ju;y 2012   No ischemia. EF 70%; fixed defect involving septum, inferoseptal and inferior wall   Pain, dental 02/06/2017   Poor dental hygiene    Retroperitoneal bleeding    Following cardiac cath    Tobacco abuse    Urine discoloration 09/18/2013   UTI (lower urinary tract infection) 10/16/2013    Past Surgical History:  Procedure Laterality Date   CARDIAC CATHETERIZATION  01/02/2007   IT REVEALS MILD INFERIOR WALL HYPOKINESIS. THE EJECTION FRACTION IS AROUND 50%   COLONOSCOPY     CORONARY ARTERY BYPASS GRAFT  11/06/2006   LIMA to LAD, SVG to DX, SVG to LCX & SVG to OM 1 & 2, and SVG to PD   CORONARY STENT PLACEMENT     Remote past stent to RCA   EYE SURGERY Right    cataracts removed   HEMORRHOID SURGERY  11/07/1987   UPPER GI ENDOSCOPY     VENTRICULOSTOMY  10/06/2011   Procedure: VENTRICULOSTOMY;  Surgeon: Winfield Cunas;  Location: [facility];  Service: Neurosurgery;  Laterality: Right;  Insertion of Ventriculostomy Catheter   WISDOM TOOTH EXTRACTION      MEDICATIONS: No current facility-administered medications for this encounter.    amLODipine (NORVASC) 10 MG tablet   aspirin EC 81 MG tablet   atorvastatin (LIPITOR) 40 MG tablet   Cholecalciferol (VITAMIN D3) 250 MCG (10000 UT) capsule   diphenhydramine-acetaminophen (TYLENOL PM) 25-500 MG TABS tablet   escitalopram (LEXAPRO) 10 MG tablet   levothyroxine (SYNTHROID) 137 MCG tablet   losartan (COZAAR) 100 MG tablet   metoprolol succinate (TOPROL-XL) 50 MG 24 hr tablet   Multiple Vitamins-Minerals (MULTIVITAMIN WITH MINERALS) tablet   nitroGLYCERIN (NITROSTAT) 0.4 MG SL tablet   Omega-3 Fatty Acids (FISH OIL) 1000 MG CAPS   omeprazole (PRILOSEC) 20 MG capsule   Vitamin A 2400 MCG (8000 UT) TABS   vitamin B-12 (CYANOCOBALAMIN) 1000 MCG tablet   albuterol (VENTOLIN HFA) 108 (90 Base) MCG/ACT inhaler   budesonide-formoterol (SYMBICORT) 80-4.5 MCG/ACT inhaler   montelukast (SINGULAIR) 10 MG tablet    Myra Gianotti, PA-C Surgical Short Stay/Anesthesiology [facility] Phone [phone] [facility] Phone [phone] 05/19/2021 4:32 PM

## 2021-05-20 ENCOUNTER — Encounter (HOSPITAL_COMMUNITY)
Admission: RE | Disposition: A | Discharge status: Home / Self Care | Payer: Self-pay | Source: Home / Self Care | Attending: Pulmonary Disease

## 2021-05-20 ENCOUNTER — Encounter (HOSPITAL_COMMUNITY): Payer: Self-pay | Admitting: Pulmonary Disease

## 2021-05-20 ENCOUNTER — Ambulatory Visit (HOSPITAL_COMMUNITY): Payer: Medicare HMO | Admitting: Vascular Surgery

## 2021-05-20 ENCOUNTER — Ambulatory Visit (HOSPITAL_COMMUNITY): Payer: Medicare HMO

## 2021-05-20 ENCOUNTER — Ambulatory Visit (HOSPITAL_COMMUNITY)
Admission: RE | Admit: 2021-05-20 | Discharge: 2021-05-20 | Disposition: A | Discharge status: Home / Self Care | Payer: Medicare HMO | Attending: Pulmonary Disease | Admitting: Pulmonary Disease

## 2021-05-20 ENCOUNTER — Telehealth: Payer: Self-pay | Admitting: Pulmonary Disease

## 2021-05-20 DIAGNOSIS — F17210 Nicotine dependence, cigarettes, uncomplicated: Secondary | ICD-10-CM | POA: Insufficient documentation

## 2021-05-20 DIAGNOSIS — I2510 Atherosclerotic heart disease of native coronary artery without angina pectoris: Secondary | ICD-10-CM | POA: Insufficient documentation

## 2021-05-20 DIAGNOSIS — I252 Old myocardial infarction: Secondary | ICD-10-CM | POA: Insufficient documentation

## 2021-05-20 DIAGNOSIS — I10 Essential (primary) hypertension: Secondary | ICD-10-CM | POA: Insufficient documentation

## 2021-05-20 DIAGNOSIS — Z7982 Long term (current) use of aspirin: Secondary | ICD-10-CM | POA: Insufficient documentation

## 2021-05-20 DIAGNOSIS — Z8041 Family history of malignant neoplasm of ovary: Secondary | ICD-10-CM | POA: Insufficient documentation

## 2021-05-20 DIAGNOSIS — Z419 Encounter for procedure for purposes other than remedying health state, unspecified: Secondary | ICD-10-CM

## 2021-05-20 DIAGNOSIS — R69 Illness, unspecified: Secondary | ICD-10-CM | POA: Diagnosis not present

## 2021-05-20 DIAGNOSIS — Z801 Family history of malignant neoplasm of trachea, bronchus and lung: Secondary | ICD-10-CM | POA: Insufficient documentation

## 2021-05-20 DIAGNOSIS — Z800 Family history of malignant neoplasm of digestive organs: Secondary | ICD-10-CM | POA: Insufficient documentation

## 2021-05-20 DIAGNOSIS — E785 Hyperlipidemia, unspecified: Secondary | ICD-10-CM | POA: Insufficient documentation

## 2021-05-20 DIAGNOSIS — Z882 Allergy status to sulfonamides status: Secondary | ICD-10-CM | POA: Diagnosis not present

## 2021-05-20 DIAGNOSIS — R918 Other nonspecific abnormal finding of lung field: Secondary | ICD-10-CM | POA: Diagnosis not present

## 2021-05-20 DIAGNOSIS — Z79899 Other long term (current) drug therapy: Secondary | ICD-10-CM | POA: Insufficient documentation

## 2021-05-20 DIAGNOSIS — Z833 Family history of diabetes mellitus: Secondary | ICD-10-CM | POA: Insufficient documentation

## 2021-05-20 DIAGNOSIS — Z825 Family history of asthma and other chronic lower respiratory diseases: Secondary | ICD-10-CM | POA: Insufficient documentation

## 2021-05-20 DIAGNOSIS — C3412 Malignant neoplasm of upper lobe, left bronchus or lung: Secondary | ICD-10-CM | POA: Diagnosis present

## 2021-05-20 DIAGNOSIS — Z888 Allergy status to other drugs, medicaments and biological substances status: Secondary | ICD-10-CM | POA: Diagnosis not present

## 2021-05-20 DIAGNOSIS — Z9889 Other specified postprocedural states: Secondary | ICD-10-CM

## 2021-05-20 DIAGNOSIS — Z951 Presence of aortocoronary bypass graft: Secondary | ICD-10-CM | POA: Diagnosis not present

## 2021-05-20 DIAGNOSIS — E039 Hypothyroidism, unspecified: Secondary | ICD-10-CM | POA: Insufficient documentation

## 2021-05-20 DIAGNOSIS — Z7951 Long term (current) use of inhaled steroids: Secondary | ICD-10-CM | POA: Insufficient documentation

## 2021-05-20 DIAGNOSIS — Z880 Allergy status to penicillin: Secondary | ICD-10-CM | POA: Diagnosis not present

## 2021-05-20 DIAGNOSIS — J449 Chronic obstructive pulmonary disease, unspecified: Secondary | ICD-10-CM | POA: Diagnosis not present

## 2021-05-20 DIAGNOSIS — Z955 Presence of coronary angioplasty implant and graft: Secondary | ICD-10-CM | POA: Diagnosis not present

## 2021-05-20 DIAGNOSIS — C3490 Malignant neoplasm of unspecified part of unspecified bronchus or lung: Secondary | ICD-10-CM

## 2021-05-20 DIAGNOSIS — K219 Gastro-esophageal reflux disease without esophagitis: Secondary | ICD-10-CM | POA: Diagnosis not present

## 2021-05-20 HISTORY — DX: Chronic obstructive pulmonary disease, unspecified: J44.9

## 2021-05-20 HISTORY — DX: Other nonspecific abnormal finding of lung field: R91.8

## 2021-05-20 HISTORY — DX: Dyspnea, unspecified: R06.00

## 2021-05-20 HISTORY — PX: VIDEO BRONCHOSCOPY WITH ENDOBRONCHIAL ULTRASOUND: SHX6177

## 2021-05-20 HISTORY — DX: Depression, unspecified: F32.A

## 2021-05-20 HISTORY — DX: Other specified personal risk factors, not elsewhere classified: Z91.89

## 2021-05-20 HISTORY — DX: Malignant (primary) neoplasm, unspecified: C80.1

## 2021-05-20 HISTORY — PX: VIDEO BRONCHOSCOPY WITH ENDOBRONCHIAL NAVIGATION: SHX6175

## 2021-05-20 LAB — CBC
HCT: 41.7 % (ref 36.0–46.0)
Hemoglobin: 13.8 g/dL (ref 12.0–15.0)
MCH: 31.5 pg (ref 26.0–34.0)
MCHC: 33.1 g/dL (ref 30.0–36.0)
MCV: 95.2 fL (ref 80.0–100.0)
Platelets: 311 10*3/uL (ref 150–400)
RBC: 4.38 MIL/uL (ref 3.87–5.11)
RDW: 13.4 % (ref 11.5–15.5)
WBC: 11.3 10*3/uL — ABNORMAL HIGH (ref 4.0–10.5)
nRBC: 0 % (ref 0.0–0.2)

## 2021-05-20 LAB — BASIC METABOLIC PANEL
Anion gap: 12 (ref 5–15)
BUN: 10 mg/dL (ref 8–23)
CO2: 21 mmol/L — ABNORMAL LOW (ref 22–32)
Calcium: 9.2 mg/dL (ref 8.9–10.3)
Chloride: 103 mmol/L (ref 98–111)
Creatinine, Ser: 0.97 mg/dL (ref 0.44–1.00)
GFR, Estimated: >60 mL/min (ref >60)
Glucose, Bld: 113 mg/dL — ABNORMAL HIGH (ref 70–99)
Potassium: 4.1 mmol/L (ref 3.5–5.1)
Sodium: 136 mmol/L (ref 135–145)

## 2021-05-20 SURGERY — VIDEO BRONCHOSCOPY WITH ENDOBRONCHIAL NAVIGATION
Anesthesia: General | Laterality: Left

## 2021-05-20 MED ORDER — ROCURONIUM BROMIDE 10 MG/ML (PF) SYRINGE
PREFILLED_SYRINGE | INTRAVENOUS | Status: DC | PRN
  Administered 2021-05-20: 40 mg via INTRAVENOUS
  Administered 2021-05-20: 20 mg via INTRAVENOUS

## 2021-05-20 MED ORDER — DEXAMETHASONE SODIUM PHOSPHATE 10 MG/ML IJ SOLN
INTRAMUSCULAR | Status: DC | PRN
  Administered 2021-05-20: 8 mg via INTRAVENOUS

## 2021-05-20 MED ORDER — PROPOFOL 10 MG/ML IV BOLUS
INTRAVENOUS | Status: DC | PRN
  Administered 2021-05-20: 20 mg via INTRAVENOUS
  Administered 2021-05-20: 100 mg via INTRAVENOUS

## 2021-05-20 MED ORDER — ROCURONIUM BROMIDE 10 MG/ML (PF) SYRINGE
PREFILLED_SYRINGE | INTRAVENOUS | Status: AC
  Filled 2021-05-20: qty 10

## 2021-05-20 MED ORDER — LACTATED RINGERS IV SOLN: INTRAVENOUS | Status: DC

## 2021-05-20 MED ORDER — ONDANSETRON HCL 4 MG/2ML IJ SOLN: 4.0000 mg | Freq: Once | INTRAMUSCULAR | Status: DC | PRN

## 2021-05-20 MED ORDER — MIDAZOLAM HCL 2 MG/2ML IJ SOLN
INTRAMUSCULAR | Status: DC | PRN
  Administered 2021-05-20: 2 mg via INTRAVENOUS

## 2021-05-20 MED ORDER — FENTANYL CITRATE (PF) 100 MCG/2ML IJ SOLN: 25.0000 ug | INTRAMUSCULAR | Status: DC | PRN

## 2021-05-20 MED ORDER — 0.9 % SODIUM CHLORIDE (POUR BTL) OPTIME
TOPICAL | Status: DC | PRN
  Administered 2021-05-20: 1000 mL

## 2021-05-20 MED ORDER — EPHEDRINE SULFATE-NACL 50-0.9 MG/10ML-% IV SOSY
PREFILLED_SYRINGE | INTRAVENOUS | Status: DC | PRN
  Administered 2021-05-20 (×2): 10 mg via INTRAVENOUS
  Administered 2021-05-20 (×2): 5 mg via INTRAVENOUS

## 2021-05-20 MED ORDER — DEXAMETHASONE SODIUM PHOSPHATE 10 MG/ML IJ SOLN
INTRAMUSCULAR | Status: AC
  Filled 2021-05-20: qty 1

## 2021-05-20 MED ORDER — FENTANYL CITRATE (PF) 250 MCG/5ML IJ SOLN
INTRAMUSCULAR | Status: DC | PRN
  Administered 2021-05-20: 150 ug via INTRAVENOUS

## 2021-05-20 MED ORDER — ONDANSETRON HCL 4 MG/2ML IJ SOLN
INTRAMUSCULAR | Status: AC
  Filled 2021-05-20: qty 2

## 2021-05-20 MED ORDER — LIDOCAINE 2% (20 MG/ML) 5 ML SYRINGE
INTRAMUSCULAR | Status: DC | PRN
  Administered 2021-05-20: 60 mg via INTRAVENOUS

## 2021-05-20 MED ORDER — FENTANYL CITRATE (PF) 250 MCG/5ML IJ SOLN
INTRAMUSCULAR | Status: AC
  Filled 2021-05-20: qty 5

## 2021-05-20 MED ORDER — MIDAZOLAM HCL 2 MG/2ML IJ SOLN
INTRAMUSCULAR | Status: AC
  Filled 2021-05-20: qty 2

## 2021-05-20 MED ORDER — CHLORHEXIDINE GLUCONATE 0.12 % MT SOLN
15.0000 mL | Freq: Once | OROMUCOSAL | Status: AC
  Administered 2021-05-20: 15 mL via OROMUCOSAL
  Filled 2021-05-20: qty 15

## 2021-05-20 MED ORDER — ACETAMINOPHEN 10 MG/ML IV SOLN: 1000.0000 mg | Freq: Once | INTRAVENOUS | Status: DC | PRN

## 2021-05-20 MED ORDER — ORAL CARE MOUTH RINSE
15.0000 mL | Freq: Once | OROMUCOSAL | Status: AC
Start: 2021-05-20 — End: 2021-05-20

## 2021-05-20 MED ORDER — PROPOFOL 10 MG/ML IV BOLUS
INTRAVENOUS | Status: AC
  Filled 2021-05-20: qty 40

## 2021-05-20 MED ORDER — AMISULPRIDE (ANTIEMETIC) 5 MG/2ML IV SOLN: 10.0000 mg | Freq: Once | INTRAVENOUS | Status: DC | PRN

## 2021-05-20 MED ORDER — SUGAMMADEX SODIUM 200 MG/2ML IV SOLN
INTRAVENOUS | Status: DC | PRN
  Administered 2021-05-20: 150 mg via INTRAVENOUS

## 2021-05-20 MED ORDER — ONDANSETRON HCL 4 MG/2ML IJ SOLN
INTRAMUSCULAR | Status: DC | PRN
  Administered 2021-05-20: 4 mg via INTRAVENOUS

## 2021-05-20 MED ORDER — LIDOCAINE 2% (20 MG/ML) 5 ML SYRINGE
INTRAMUSCULAR | Status: AC
  Filled 2021-05-20: qty 5

## 2021-05-20 MED ORDER — PHENYLEPHRINE 40 MCG/ML (10ML) SYRINGE FOR IV PUSH (FOR BLOOD PRESSURE SUPPORT)
PREFILLED_SYRINGE | INTRAVENOUS | Status: DC | PRN
  Administered 2021-05-20: 80 ug via INTRAVENOUS

## 2021-05-20 SURGICAL SUPPLY — 52 items
ADAPTER BRONCHOSCOPE OLYMPUS (ADAPTER) ×3 IMPLANT
ADAPTER VALVE BIOPSY EBUS (MISCELLANEOUS) IMPLANT
ADPR BSCP OLMPS EDG (ADAPTER) ×2
ADPTR VALVE BIOPSY EBUS (MISCELLANEOUS)
BAG COUNTER SPONGE SURGICOUNT (BAG) ×3 IMPLANT
BAG SPNG CNTER NS LX DISP (BAG) ×2
BRUSH CYTOL CELLEBRITY 1.5X140 (MISCELLANEOUS) ×2 IMPLANT
BRUSH SUPERTRAX BIOPSY (INSTRUMENTS) ×1 IMPLANT
BRUSH SUPERTRAX NDL-TIP CYTO (INSTRUMENTS) ×3 IMPLANT
CANISTER SUCT 3000ML PPV (MISCELLANEOUS) ×3 IMPLANT
CNTNR URN SCR LID CUP LEK RST (MISCELLANEOUS) ×2 IMPLANT
CONT SPEC 4OZ STRL OR WHT (MISCELLANEOUS) ×3
COVER BACK TABLE 60X90IN (DRAPES) ×3 IMPLANT
FILTER STRAW FLUID ASPIR (MISCELLANEOUS) IMPLANT
FORCEPS BIOP RJ4 1.8 (CUTTING FORCEPS) IMPLANT
FORCEPS BIOP SUPERTRX PREMAR (INSTRUMENTS) ×3 IMPLANT
GAUZE SPONGE 4X4 12PLY STRL (GAUZE/BANDAGES/DRESSINGS) ×3 IMPLANT
GLOVE SURG POLYISO LF SZ7.5 (GLOVE) ×6 IMPLANT
GOWN STRL REUS W/ TWL LRG LVL3 (GOWN DISPOSABLE) ×4 IMPLANT
GOWN STRL REUS W/TWL LRG LVL3 (GOWN DISPOSABLE) ×6
KIT CLEAN ENDO COMPLIANCE (KITS) ×6 IMPLANT
KIT ILLUMISITE 180 PROCEDURE (KITS) IMPLANT
KIT ILLUMISITE 90 PROCEDURE (KITS) ×1 IMPLANT
KIT LOCATABLE GUIDE (CANNULA) IMPLANT
KIT MARKER FIDUCIAL DELIVERY (KITS) IMPLANT
KIT TURNOVER KIT B (KITS) ×3 IMPLANT
MARKER SKIN DUAL TIP RULER LAB (MISCELLANEOUS) ×3 IMPLANT
NDL ASPIRATION VIZISHOT 19G (NEEDLE) IMPLANT
NDL ASPIRATION VIZISHOT 21G (NEEDLE) IMPLANT
NDL SUPERTRX PREMARK BIOPSY (NEEDLE) ×2 IMPLANT
NEEDLE ASPIRATION VIZISHOT 19G (NEEDLE) IMPLANT
NEEDLE ASPIRATION VIZISHOT 21G (NEEDLE) IMPLANT
NEEDLE SUPERTRX PREMARK BIOPSY (NEEDLE) ×3 IMPLANT
NS IRRIG 1000ML POUR BTL (IV SOLUTION) ×3 IMPLANT
OIL SILICONE PENTAX (PARTS (SERVICE/REPAIRS)) ×3 IMPLANT
PAD ARMBOARD 7.5X6 YLW CONV (MISCELLANEOUS) ×6 IMPLANT
PATCHES PATIENT (LABEL) ×9 IMPLANT
STOPCOCK 4 WAY LG BORE MALE ST (IV SETS) ×3 IMPLANT
SYR 20ML ECCENTRIC (SYRINGE) ×9 IMPLANT
SYR 20ML LL LF (SYRINGE) ×3 IMPLANT
SYR 3ML LL SCALE MARK (SYRINGE) IMPLANT
SYR 50ML SLIP (SYRINGE) ×5 IMPLANT
SYR 5ML LL (SYRINGE) ×12 IMPLANT
TOWEL GREEN STERILE FF (TOWEL DISPOSABLE) ×3 IMPLANT
TRAP SPECIMEN MUCUS 40CC (MISCELLANEOUS) ×1 IMPLANT
TUBE CONNECTING 20X1/4 (TUBING) ×3 IMPLANT
TUBING EXTENTION W/L.L. (IV SETS) ×3 IMPLANT
UNDERPAD 30X36 HEAVY ABSORB (UNDERPADS AND DIAPERS) ×3 IMPLANT
VALVE BIOPSY  SINGLE USE (MISCELLANEOUS) ×3
VALVE BIOPSY SINGLE USE (MISCELLANEOUS) ×2 IMPLANT
VALVE SUCTION BRONCHIO DISP (MISCELLANEOUS) ×4 IMPLANT
WATER STERILE IRR 1000ML POUR (IV SOLUTION) ×3 IMPLANT

## 2021-05-20 NOTE — Anesthesia Procedure Notes (Addendum)
Procedure Name: Intubation Date/Time: 05/20/2021 10:47 AM Performed by: Leonor Liv, CRNA Pre-anesthesia Checklist: Patient identified, Emergency Drugs available, Suction available and Patient being monitored Patient Re-evaluated:Patient Re-evaluated prior to induction Oxygen Delivery Method: Circle System Utilized Preoxygenation: Pre-oxygenation with 100% oxygen Induction Type: IV induction Ventilation: Mask ventilation without difficulty Laryngoscope Size: Mac and 3 Grade View: Grade I Tube type: Oral Number of attempts: 1 Airway Equipment and Method: Oral airway and Stylet Placement Confirmation: ETT inserted through vocal cords under direct vision, positive ETCO2 and breath sounds checked- equal and bilateral Secured at: 20 cm Tube secured with: Tape Dental Injury: Teeth and Oropharynx as per pre-operative assessment  Comments: Poor dentition (multiple broken and missing teeth) teeth intact post-intubation as per preop baseline

## 2021-05-20 NOTE — Op Note (Signed)
Video Bronchoscopy with Electromagnetic Navigation Procedure Note Video Bronchoscopy with Endobronchial Ultrasound Procedure Note  Date of Operation: 05/20/2021  Pre-op Diagnosis: Left upper lobe lung mass  Post-op Diagnosis: Left upper lobe lung mass  Surgeon: Garner Nash, DO   Assistants: None   Anesthesia: General endotracheal anesthesia  Operation: Flexible video fiberoptic bronchoscopy with electromagnetic navigation and biopsies.  Estimated Blood Loss: Minimal  Complications: None   Indications and History: Jill [name] is a 70 y.o. female with MCV R 10.  The risks, benefits, complications, treatment options and expected outcomes were discussed with the patient.  The possibilities of pneumothorax, pneumonia, reaction to medication, pulmonary aspiration, perforation of a viscus, bleeding, failure to diagnose a condition and creating a complication requiring transfusion or operation were discussed with the patient who freely signed the consent.    Description of Procedure: The patient was seen in the Preoperative Area, was examined and was deemed appropriate to proceed.  The patient was taken to [facility] OR 10, identified as Jill [name] and the procedure verified as Flexible Video Fiberoptic Bronchoscopy.  A Time Out was held and the above information confirmed.   Target #1 left upper lobe mass: Prior to the date of the procedure a high-resolution CT scan of the chest was performed. Utilizing [employer] a virtual tracheobronchial tree was generated to allow the creation of distinct navigation pathways to the patient's parenchymal abnormalities. After being taken to the operating room general anesthesia was initiated and the patient  was orally intubated. The video fiberoptic bronchoscope was introduced via the endotracheal tube and a general inspection was performed which showed normal right and left lung anatomy, no visible endobronchial lesion identified, distal  airway pitting. The extendable working channel and locator guide were introduced into the bronchoscope. The distinct navigation pathways prepared prior to this procedure were then utilized to navigate to within 0.8 cm of patient's lesion(s) identified on CT scan. The extendable working channel was secured into place and the locator guide was withdrawn. Under fluoroscopic guidance transbronchial needle brushings, transbronchial Wang needle biopsies, and transbronchial forceps biopsies were performed to be sent for cytology and pathology. A bronchioalveolar lavage was performed in the left upper lobe and sent for cytology.    Target #2 Station 7: The standard scope was then withdrawn and the endobronchial ultrasound was used to identify and characterize the peritracheal, hilar and bronchial lymph nodes. Inspection showed small and large subcarinal node greater than 1 cm in size largest cross-section. Using real-time ultrasound guidance Wang needle biopsies were take from Station 7 nodes and were sent for cytology. The patient tolerated the procedure well without apparent complications. There was no significant blood loss. The bronchoscope was withdrawn.   Standard therapeutic bronchoscope was inserted into the patient's airway used for aspiration of the bilateral mainstem's to remove any remaining blood clots and debris.  All distal subsegments were patent at the termination of the procedure.  At the end of the procedure a general airway inspection was performed and there was no evidence of active bleeding. The bronchoscope was removed.  The patient tolerated the procedure well. There was no significant blood loss and there were no obvious complications. A post-procedural chest x-ray is pending.  Target #1 samples: 1. Transbronchial needle brushings from left upper lobe 2. Transbronchial Wang needle biopsies from left upper lobe 3. Transbronchial forceps biopsies from left upper lobe 4. Bronchoalveolar  lavage from left upper lobe  Target #2 samples: 1. Wang needle biopsies from station 7  node  Plans:  The patient will be discharged from the PACU to home when recovered from anesthesia and after chest x-ray is reviewed. We will review the cytology, pathology and microbiology results with the patient when they become available. Outpatient followup will be with Leory Plowman L Kortez Murtagh,DO.   [facility] 05/20/2021 12:11 PM

## 2021-05-20 NOTE — Discharge Instructions (Signed)
Flexible Bronchoscopy, Care After This sheet gives you information about how to care for yourself after your test. Your doctor may also give you more specific instructions. If you have problems or questions, contact your doctor. Follow these instructions at home: Eating and drinking Do not eat or drink anything (not even water) for 2 hours after your test, or until your numbing medicine (local anesthetic) wears off. When your numbness is gone and your cough and gag reflexes have come back, you may: Eat only soft foods. Slowly drink liquids. The day after the test, go back to your normal diet. Driving Do not drive for 24 hours if you were given a medicine to help you relax (sedative). Do not drive or use heavy machinery while taking prescription pain medicine. General instructions  Take over-the-counter and prescription medicines only as told by your doctor. Return to your normal activities as told. Ask what activities are safe for you. Do not use any products that have nicotine or tobacco in them. This includes cigarettes and e-cigarettes. If you need help quitting, ask your doctor. Keep all follow-up visits as told by your doctor. This is important. It is very important if you had a tissue sample (biopsy) taken. Get help right away if: You have shortness of breath that gets worse. You get light-headed. You feel like you are going to pass out (faint). You have chest pain. You cough up: More than a little blood. More blood than before. Summary Do not eat or drink anything (not even water) for 2 hours after your test, or until your numbing medicine wears off. Do not use cigarettes. Do not use e-cigarettes. Get help right away if you have chest pain.  This information is not intended to replace advice given to you by your health care provider. Make sure you discuss any questions you have with your health care provider. Document Released: 08/20/2009 Document Revised: 10/05/2017 Document  Reviewed: 11/10/2016 Elsevier Patient Education  2020 [employer].

## 2021-05-20 NOTE — Interval H&P Note (Signed)
History and Physical Interval Note:  05/20/2021 10:33 AM  Jill [name]  has presented today for surgery, with the diagnosis of Lung mass.  The various methods of treatment have been discussed with the patient and family. After consideration of risks, benefits and other options for treatment, the patient has consented to  Procedure(s): VIDEO BRONCHOSCOPY WITH ENDOBRONCHIAL NAVIGATION (Left) VIDEO BRONCHOSCOPY WITH ENDOBRONCHIAL ULTRASOUND (Bilateral) as a surgical intervention.  The patient's history has been reviewed, patient examined, no change in status, stable for surgery.  I have reviewed the patient's chart and labs.  Questions were answered to the patient's satisfaction.     [place]

## 2021-05-20 NOTE — Telephone Encounter (Signed)
PCCM:  Bronchoscopy completed today.  Preliminary pathology concerning for non-small cell lung cancer. Final path pending.  PET scan has been ordered MRI brain has been ordered Consultation and referral placed to Dr. Earlie Server.  [facility] 05/20/2021 12:24 PM

## 2021-05-20 NOTE — Transfer of Care (Signed)
Immediate Anesthesia Transfer of Care Note  Patient: Jill [name]  Procedure(s) Performed: VIDEO BRONCHOSCOPY WITH ENDOBRONCHIAL NAVIGATION (Left) VIDEO BRONCHOSCOPY WITH ENDOBRONCHIAL ULTRASOUND (Bilateral)  Patient Location: PACU  Anesthesia Type:General  Level of Consciousness: awake, alert  and oriented  Airway & Oxygen Therapy: Patient Spontanous Breathing and Patient connected to face mask oxygen  Post-op Assessment: Report given to RN, Post -op Vital signs reviewed and stable and Patient moving all extremities  Post vital signs: Reviewed and stable  Last Vitals:  Vitals Value Taken Time  BP 150/68 05/20/21 1208  Temp    Pulse 72 05/20/21 1212  Resp 24 05/20/21 1212  SpO2 99 % 05/20/21 1212  Vitals shown include unvalidated device data.  Last Pain:  Vitals:   05/20/21 0823  TempSrc:   PainSc: 0-No pain      Patients Stated Pain Goal: 3 (97/02/63 [id number])  Complications: No notable events documented.

## 2021-05-22 ENCOUNTER — Encounter (HOSPITAL_COMMUNITY): Payer: Self-pay | Admitting: Pulmonary Disease

## 2021-05-22 NOTE — Anesthesia Postprocedure Evaluation (Signed)
Anesthesia Post Note  Patient: SALENA ORTLIEB  Procedure(s) Performed: VIDEO BRONCHOSCOPY WITH ENDOBRONCHIAL NAVIGATION (Left) VIDEO BRONCHOSCOPY WITH ENDOBRONCHIAL ULTRASOUND (Bilateral)     Patient location during evaluation: PACU Anesthesia Type: General Level of consciousness: awake Pain management: pain level controlled Vital Signs Assessment: post-procedure vital signs reviewed and stable Respiratory status: spontaneous breathing, nonlabored ventilation, respiratory function stable and patient connected to nasal cannula oxygen Cardiovascular status: blood pressure returned to baseline and stable Postop Assessment: no apparent nausea or vomiting Anesthetic complications: no   No notable events documented.  Last Vitals:  Vitals:   05/20/21 1224 05/20/21 1238  BP: 117/70 (!) 112/57  Pulse: 75 76  Resp: (!) 21 13  Temp:  36.7 C  SpO2: 92% 94%    Last Pain:  Vitals:   05/20/21 1238  TempSrc:   PainSc: 0-No pain                 Pati Thinnes P Arthor Gorter

## 2021-05-23 ENCOUNTER — Non-Acute Institutional Stay (HOSPITAL_BASED_OUTPATIENT_CLINIC_OR_DEPARTMENT_OTHER): Payer: Self-pay | Admitting: *Deleted

## 2021-05-23 DIAGNOSIS — R918 Other nonspecific abnormal finding of lung field: Secondary | ICD-10-CM

## 2021-05-24 ENCOUNTER — Other Ambulatory Visit: Payer: Self-pay

## 2021-05-24 ENCOUNTER — Telehealth: Payer: Self-pay | Admitting: Internal Medicine

## 2021-05-24 DIAGNOSIS — E785 Hyperlipidemia, unspecified: Secondary | ICD-10-CM

## 2021-05-24 MED ORDER — ATORVASTATIN CALCIUM 40 MG PO TABS: 40.0000 mg | ORAL_TABLET | Freq: Every day | ORAL | 1 refills | Status: DC

## 2021-05-24 MED ORDER — LOSARTAN POTASSIUM 100 MG PO TABS: 100.0000 mg | ORAL_TABLET | Freq: Every day | ORAL | 1 refills | Status: DC

## 2021-05-24 NOTE — Telephone Encounter (Signed)
Received a new pt referral from Dr. Valeta Harms for lung cancer. Jill [name] has been scheduled to see Dr. Julien Nordmann on 8/1 at 2:15pm w/labs at 1:45pm. I cld and lft the appt date and time on the pt's vm. Letter mailed.

## 2021-05-25 ENCOUNTER — Other Ambulatory Visit: Payer: Self-pay | Admitting: Family Medicine

## 2021-05-26 ENCOUNTER — Telehealth: Payer: Self-pay | Admitting: Acute Care

## 2021-05-26 LAB — CYTOLOGY - NON PAP

## 2021-05-26 NOTE — Telephone Encounter (Signed)
I have attempted to call the patient to give her an update on her pathology.  There was no answer. Only one of the pathology report has resulted and that resulted today. It is of the #7 node.  It was negative for malignant cells, ( there was scant lymphoid material) however there are still several biopsies, washings, and BAL pending from the left upper lobe. The patient has a PET scan scheduled for 7/26, and MRI brain scheduled for 729, and an appointment with Dr. Valeta Harms 7/28, and an appointment with Dr. Earlie Server scheduled for 8 /1. Triage, if the patient does call back please let her know we are still awaiting results of the biopsies and confirm she is aware of the appointments and follow-up imaging above. Thanks so much.

## 2021-05-26 NOTE — Telephone Encounter (Signed)
I spoke with the Jill [name] and notified of the below per Judson Roch. Jill [name] is aware of all of her appts and plans to keep all of them. She understands we are still awaiting her results.

## 2021-05-26 NOTE — Telephone Encounter (Signed)
Pt returning a phone call. Pt can be reached at [phone]

## 2021-05-30 ENCOUNTER — Encounter: Payer: Self-pay | Admitting: Adult Health

## 2021-05-31 ENCOUNTER — Other Ambulatory Visit: Payer: Self-pay

## 2021-05-31 ENCOUNTER — Ambulatory Visit
Admission: RE | Admit: 2021-05-31 | Discharge: 2021-05-31 | Disposition: A | Discharge status: Home / Self Care | Payer: Medicare HMO | Source: Ambulatory Visit | Attending: Pulmonary Disease | Admitting: Pulmonary Disease

## 2021-05-31 DIAGNOSIS — C3490 Malignant neoplasm of unspecified part of unspecified bronchus or lung: Secondary | ICD-10-CM | POA: Diagnosis not present

## 2021-05-31 DIAGNOSIS — J984 Other disorders of lung: Secondary | ICD-10-CM | POA: Diagnosis not present

## 2021-05-31 LAB — GLUCOSE, CAPILLARY: Glucose-Capillary: 105 mg/dL — ABNORMAL HIGH (ref 70–99)

## 2021-05-31 MED ORDER — FLUDEOXYGLUCOSE F - 18 (FDG) INJECTION
6.9000 | Freq: Once | INTRAVENOUS | Status: AC | PRN
  Administered 2021-05-31: 7.35 via INTRAVENOUS

## 2021-06-02 ENCOUNTER — Other Ambulatory Visit: Payer: Self-pay | Admitting: *Deleted

## 2021-06-02 ENCOUNTER — Ambulatory Visit (INDEPENDENT_AMBULATORY_CARE_PROVIDER_SITE_OTHER): Payer: Medicare HMO | Admitting: Pulmonary Disease

## 2021-06-02 ENCOUNTER — Encounter: Payer: Self-pay | Admitting: Radiation Oncology

## 2021-06-02 ENCOUNTER — Other Ambulatory Visit: Payer: Self-pay

## 2021-06-02 VITALS — BP 118/64 | HR 61 | Ht 67.0 in | Wt 131.0 lb

## 2021-06-02 DIAGNOSIS — C3412 Malignant neoplasm of upper lobe, left bronchus or lung: Secondary | ICD-10-CM

## 2021-06-02 DIAGNOSIS — F17200 Nicotine dependence, unspecified, uncomplicated: Secondary | ICD-10-CM

## 2021-06-02 DIAGNOSIS — R918 Other nonspecific abnormal finding of lung field: Secondary | ICD-10-CM | POA: Diagnosis not present

## 2021-06-02 DIAGNOSIS — R599 Enlarged lymph nodes, unspecified: Secondary | ICD-10-CM

## 2021-06-02 DIAGNOSIS — J449 Chronic obstructive pulmonary disease, unspecified: Secondary | ICD-10-CM

## 2021-06-02 DIAGNOSIS — R69 Illness, unspecified: Secondary | ICD-10-CM | POA: Diagnosis not present

## 2021-06-02 MED ORDER — TRELEGY ELLIPTA 100-62.5-25 MCG/INH IN AEPB: 1.0000 | INHALATION_SPRAY | Freq: Every day | RESPIRATORY_TRACT | 0 refills | Status: AC

## 2021-06-02 NOTE — Progress Notes (Signed)
Synopsis: Referred in July 2022 for abnormal ct chest by [name], MD  Subjective:   PATIENT ID: Jill [name] GENDER: female DOB: [birth year], MRN: [id number]  Chief Complaint  Patient presents with   Follow-up    Mass of left lung. Still short of breath.     This is a 70 year old female, current smoker, since age 21, currently smoking 1 pack/day.  She has medical history of hypertension, hyperlipidemia, UTI, coronary artery disease, MI, stent to the RCA with CABG in 2008.  She presents today after having a lung cancer screening CT. this was completed on 04/28/2021.  Lung cancer screening CT revealed a large 3.1 cm left upper lobe mass with associated mediastinal adenopathy however small in size.  This was concerning for primary bronchogenic carcinoma.  Patient was referred for evaluation.  From respiratory standpoint she is still smoking.  She does have cough and sputum production.  She is not that significantly breathless but is able to complete her activities of daily living.  OV 06/02/2021: Here today for follow-up after recent bronchoscopy.  New tissue diagnosis of adenocarcinoma.  Recent PET scan concerning for stage III lung cancer.  Her station 7 lymph node was negative.  Paratracheal node not sampled during bronchoscopy.  Currently not using any inhalers.  Unfortunately she is still smoking.  We talked about smoking cessation today in the office.    Past Medical History:  Diagnosis Date   Anxiety    ON PAXIL, XANAX   AVM (arteriovenous malformation) brain    s/p stent/coil   Blood transfusion    x 2   Cancer (HCC)    Left lung   COPD (chronic obstructive pulmonary disease) (HCC)    no inhaler, no oxygen   Coronary artery disease    Prior inferior MI with stent to RCA, s/p CABG in 2008   Depression    Dizziness    Dyspnea    with exertion, no oxygen   Fatigue    Foot injury 06/01/2017   right - RESOLVED, no longer an issue per patient 05/18/21   GERD  (gastroesophageal reflux disease)    Headache(784.0)    UNRUPTURED CEREBRAL ANEURYSM   Hyperlipidemia    Hypertension    Hypothyroidism    Infected cyst of skin 09/18/2013   Left knee pain 06/05/2012   Lung mass    Left lung   Myocardial infarction ([place])    Normal nuclear stress test Ju;y 2012   No ischemia. EF 70%; fixed defect involving septum, inferoseptal and inferior wall   Pain, dental 02/06/2017   Poor dental hygiene    Retroperitoneal bleeding    Following cardiac cath   Tobacco abuse    Urine discoloration 09/18/2013   UTI (lower urinary tract infection) 10/16/2013     Family History  Problem Relation Age of Onset   Cancer Mother 28   Diabetes Mother    Alcohol abuse Father    Asthma Father    Diabetes Brother    Lung cancer Brother    Ovarian cancer Maternal Aunt    Liver cancer Paternal Aunt    Heart disease Neg Hx      Past Surgical History:  Procedure Laterality Date   CARDIAC CATHETERIZATION  01/02/2007   IT REVEALS MILD INFERIOR WALL HYPOKINESIS. THE EJECTION FRACTION IS AROUND 50%   COLONOSCOPY     CORONARY ARTERY BYPASS GRAFT  11/06/2006   LIMA to LAD, SVG to DX, SVG to LCX & SVG to OM  1 & 2, and SVG to PD   CORONARY STENT PLACEMENT     Remote past stent to RCA   EYE SURGERY Right    cataracts removed   HEMORRHOID SURGERY  11/07/1987   UPPER GI ENDOSCOPY     VENTRICULOSTOMY  10/06/2011   Procedure: VENTRICULOSTOMY;  Surgeon: Winfield Cunas;  Location: [facility];  Service: Neurosurgery;  Laterality: Right;  Insertion of Ventriculostomy Catheter   VIDEO BRONCHOSCOPY WITH ENDOBRONCHIAL NAVIGATION Left 05/20/2021   Procedure: VIDEO BRONCHOSCOPY WITH ENDOBRONCHIAL NAVIGATION;  Surgeon: Garner Nash, DO;  Location: [facility];  Service: Pulmonary;  Laterality: Left;   VIDEO BRONCHOSCOPY WITH ENDOBRONCHIAL ULTRASOUND Bilateral 05/20/2021   Procedure: VIDEO BRONCHOSCOPY WITH ENDOBRONCHIAL ULTRASOUND;  Surgeon: Garner Nash, DO;  Location: [facility];   Service: Pulmonary;  Laterality: Bilateral;   WISDOM TOOTH EXTRACTION      Social History   Socioeconomic History   Marital status: Married    Spouse name: Not on file   Number of children: Not on file   Years of education: Not on file   Highest education level: Not on file  Occupational History   Not on file  Tobacco Use   Smoking status: Every Day    Packs/day: 1.50    Years: 58.00    Pack years: 87.00    Types: Cigarettes    Start date: 11/06/1957   Smokeless tobacco: Never   Tobacco comments:    Has smoked 3 ppd, current 1.5-2 ppd  Vaping Use   Vaping Use: Never used  Substance and Sexual Activity   Alcohol use: No   Drug use: No   Sexual activity: Yes    Birth control/protection: Post-menopausal  Other Topics Concern   Not on file  Social History Writer.  Some college.  Currently retired.  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 worked in [employer] most recently.      Lives with husband and 2 adult daughters and 4 grandchildren.   Social Determinants of Health   Financial Resource Strain: Not on file  Food Insecurity: Not on file  Transportation Needs: Not on file  Physical Activity: Not on file  Stress: Not on file  Social Connections: Not on file  Intimate Partner Violence: Not on file     Allergies  Allergen Reactions   Varenicline Other (See Comments)    Reports change in mood with increased irritability and homicidal ideation.  2007    Ace Inhibitors Cough   Prednisone Other (See Comments)    Crying, disorientation - occurred in 1994 Rage   Penicillins Rash    Reaction: Childhood   Sulfa Drugs Cross Reactors Rash     Outpatient Medications Prior to Visit  Medication Sig Dispense Refill   albuterol (VENTOLIN HFA) 108 (90 Base) MCG/ACT inhaler Inhale 2 puffs into the lungs every 6 (six) hours as needed for wheezing or shortness of breath. 18 g 3   amLODipine (NORVASC) 10 MG tablet Take 1 tablet by mouth once  daily 90 tablet 0   aspirin EC 81 MG tablet Take 1 tablet (81 mg total) by mouth daily. 90 tablet 2   atorvastatin (LIPITOR) 40 MG tablet Take 1 tablet (40 mg total) by mouth daily. 90 tablet 1   budesonide-formoterol (SYMBICORT) 80-4.5 MCG/ACT inhaler Inhale 2 puffs into the lungs 2 (two) times daily. 1 Inhaler 2   Cholecalciferol (VITAMIN D3) 250 MCG (10000 UT) capsule Take 10,000 mcg by mouth daily.  diphenhydramine-acetaminophen (TYLENOL PM) 25-500 MG TABS tablet Take 1 tablet by mouth every other day. At bedtime     escitalopram (LEXAPRO) 10 MG tablet Take 1 tablet by mouth once daily 90 tablet 1   levothyroxine (SYNTHROID) 137 MCG tablet Take 1 tablet (137 mcg total) by mouth daily. 30 tablet 1   losartan (COZAAR) 100 MG tablet Take 1 tablet (100 mg total) by mouth daily. 90 tablet 1   metoprolol succinate (TOPROL-XL) 50 MG 24 hr tablet TAKE 1 TABLET BY MOUTH ONCE DAILY.  TAKE  WITH  OR  IMMEDIATELY  FOLLOWING  A  MEAL. 90 tablet 1   montelukast (SINGULAIR) 10 MG tablet Take 1 tablet (10 mg total) by mouth at bedtime. 30 tablet 3   Multiple Vitamins-Minerals (MULTIVITAMIN WITH MINERALS) tablet Take 1 tablet by mouth daily.     nitroGLYCERIN (NITROSTAT) 0.4 MG SL tablet Place 1 tablet (0.4 mg total) under the tongue every 5 (five) minutes as needed. For chest pain 25 tablet 6   Omega-3 Fatty Acids (FISH OIL) 1000 MG CAPS Take 2 capsules (2,000 mg total) by mouth in the morning and at bedtime. 120 capsule 1   omeprazole (PRILOSEC) 20 MG capsule Take 1 capsule (20 mg total) by mouth daily. Prolonged use may damage the kidney 90 capsule 1   Vitamin A 2400 MCG (8000 UT) TABS Take 2,400 mg by mouth daily.     vitamin B-12 (CYANOCOBALAMIN) 1000 MCG tablet Take 1,000 mcg by mouth daily.     No facility-administered medications prior to visit.    Review of Systems  Constitutional:  Negative for chills, fever, malaise/fatigue and weight loss.  HENT:  Negative for hearing loss, sore throat and  tinnitus.   Eyes:  Negative for blurred vision and double vision.  Respiratory:  Positive for shortness of breath. Negative for cough, hemoptysis, sputum production, wheezing and stridor.   Cardiovascular:  Negative for chest pain, palpitations, orthopnea, leg swelling and PND.  Gastrointestinal:  Negative for abdominal pain, constipation, diarrhea, heartburn, nausea and vomiting.  Genitourinary:  Negative for dysuria, hematuria and urgency.  Musculoskeletal:  Negative for joint pain and myalgias.  Skin:  Negative for itching and rash.  Neurological:  Negative for dizziness, tingling, weakness and headaches.  Endo/Heme/Allergies:  Negative for environmental allergies. Does not bruise/bleed easily.  Psychiatric/Behavioral:  Negative for depression. The patient is not nervous/anxious and does not have insomnia.   All other systems reviewed and are negative.   Objective:  Physical Exam Vitals reviewed.  Constitutional:      General: She is not in acute distress.    Appearance: She is well-developed.  HENT:     Head: Normocephalic and atraumatic.     Mouth/Throat:     Pharynx: No oropharyngeal exudate.  Eyes:     Conjunctiva/sclera: Conjunctivae normal.     Pupils: Pupils are equal, round, and reactive to light.  Neck:     Vascular: No JVD.     Trachea: No tracheal deviation.     Comments: Loss of supraclavicular fat Cardiovascular:     Rate and Rhythm: Normal rate and regular rhythm.     Heart sounds: S1 normal and S2 normal.     Comments: Distant heart tones Pulmonary:     Effort: No tachypnea or accessory muscle usage.     Breath sounds: No stridor. Decreased breath sounds (throughout all lung fields) present. No wheezing, rhonchi or rales.  Abdominal:     General: Bowel sounds are normal. There is  no distension.     Palpations: Abdomen is soft.     Tenderness: There is no abdominal tenderness.  Musculoskeletal:        General: Deformity (muscle wasting ) present.  Skin:     General: Skin is warm and dry.     Capillary Refill: Capillary refill takes less than 2 seconds.     Findings: No rash.  Neurological:     Mental Status: She is alert and oriented to person, place, and time.  Psychiatric:        Behavior: Behavior normal.     Vitals:   06/02/21 1038  BP: 118/64  Pulse: 61  SpO2: 96%  Weight: 131 lb (59.4 kg)  Height: 5\' 7"  (1.702 m)   96% on RA BMI Readings from Last 3 Encounters:  06/02/21 20.52 kg/m  05/31/21 20.20 kg/m  05/20/21 20.89 kg/m   Wt Readings from Last 3 Encounters:  06/02/21 131 lb (59.4 kg)  05/31/21 129 lb (58.5 kg)  05/20/21 133 lb 6.1 oz (60.5 kg)     CBC    Component Value Date/Time   WBC 11.3 (H) 05/20/2021 0806   RBC 4.38 05/20/2021 0806   HGB 13.8 05/20/2021 0806   HGB 13.6 04/13/2021 0944   HCT 41.7 05/20/2021 0806   HCT 40.8 04/13/2021 0944   PLT 311 05/20/2021 0806   PLT 328 04/13/2021 0944   MCV 95.2 05/20/2021 0806   MCV 93 04/13/2021 0944   MCH 31.5 05/20/2021 0806   MCHC 33.1 05/20/2021 0806   RDW 13.4 05/20/2021 0806   RDW 11.8 04/13/2021 0944   LYMPHSABS 2.8 04/13/2021 0944   MONOABS 0.7 04/25/2012 0712   EOSABS 0.2 04/13/2021 0944   BASOSABS 0.1 04/13/2021 0944    Chest Imaging: 04/28/2021 lung cancer screening CT: 3 cm left upper lobe mass concerning for primary bronchogenic carcinoma. The patient's images have been independently reviewed by me.    Nuclear medicine pet imaging July 2022: PET avid left upper lobe lesion with associated small area in the left hilum and an enlarged paratracheal node that is PET avid. Concerning for stage III malignancy. The patient's images have been independently reviewed by me.    Pulmonary Functions Testing Results: No flowsheet data found.  FeNO:   Pathology:   Echocardiogram:   Heart Catheterization:     Assessment & Plan:     ICD-10-CM   1. Primary adenocarcinoma of left upper lobe of lung ([place])  C34.12     2. Mass of left lung   R91.8     3. Adenopathy  R59.9     4. Current smoker  F17.200       Discussion:  70 year old female, current smoker, left upper lobe 3 cm lung mass concerning for primary bronchogenic carcinoma, recent bronchoscopy with tissue diagnosis, positive for adenocarcinoma of the lung.  Case reviewed in medical thoracic oncology conference this morning.  Suspect stage III malignancy.  During bronchoscopy patient subcarinal node was sampled.  Suspected on pre-LAMA in the room that this was going to be positive however final path was negative.  PET scan does confirm avid uptake within the pretracheal node which was not sampled.  Plan: Today in the office we reviewed bronchoscopy results, path results. We discussed neck steps. Patient was counseled again on smoking cessation. Patient also was encouraged to attempt use of inhaler as I do suspect she has underlying COPD. We will start her on Trelegy samples Trelegy 100 New prescription for Trelegy with [place] financial  aid application.    Current Outpatient Medications:    albuterol (VENTOLIN HFA) 108 (90 Base) MCG/ACT inhaler, Inhale 2 puffs into the lungs every 6 (six) hours as needed for wheezing or shortness of breath., Disp: 18 g, Rfl: 3   amLODipine (NORVASC) 10 MG tablet, Take 1 tablet by mouth once daily, Disp: 90 tablet, Rfl: 0   aspirin EC 81 MG tablet, Take 1 tablet (81 mg total) by mouth daily., Disp: 90 tablet, Rfl: 2   atorvastatin (LIPITOR) 40 MG tablet, Take 1 tablet (40 mg total) by mouth daily., Disp: 90 tablet, Rfl: 1   budesonide-formoterol (SYMBICORT) 80-4.5 MCG/ACT inhaler, Inhale 2 puffs into the lungs 2 (two) times daily., Disp: 1 Inhaler, Rfl: 2   Cholecalciferol (VITAMIN D3) 250 MCG (10000 UT) capsule, Take 10,000 mcg by mouth daily., Disp: , Rfl:    diphenhydramine-acetaminophen (TYLENOL PM) 25-500 MG TABS tablet, Take 1 tablet by mouth every other day. At bedtime, Disp: , Rfl:    escitalopram (LEXAPRO) 10 MG tablet, Take 1  tablet by mouth once daily, Disp: 90 tablet, Rfl: 1   levothyroxine (SYNTHROID) 137 MCG tablet, Take 1 tablet (137 mcg total) by mouth daily., Disp: 30 tablet, Rfl: 1   losartan (COZAAR) 100 MG tablet, Take 1 tablet (100 mg total) by mouth daily., Disp: 90 tablet, Rfl: 1   metoprolol succinate (TOPROL-XL) 50 MG 24 hr tablet, TAKE 1 TABLET BY MOUTH ONCE DAILY.  TAKE  WITH  OR  IMMEDIATELY  FOLLOWING  A  MEAL., Disp: 90 tablet, Rfl: 1   montelukast (SINGULAIR) 10 MG tablet, Take 1 tablet (10 mg total) by mouth at bedtime., Disp: 30 tablet, Rfl: 3   Multiple Vitamins-Minerals (MULTIVITAMIN WITH MINERALS) tablet, Take 1 tablet by mouth daily., Disp: , Rfl:    nitroGLYCERIN (NITROSTAT) 0.4 MG SL tablet, Place 1 tablet (0.4 mg total) under the tongue every 5 (five) minutes as needed. For chest pain, Disp: 25 tablet, Rfl: 6   Omega-3 Fatty Acids (FISH OIL) 1000 MG CAPS, Take 2 capsules (2,000 mg total) by mouth in the morning and at bedtime., Disp: 120 capsule, Rfl: 1   omeprazole (PRILOSEC) 20 MG capsule, Take 1 capsule (20 mg total) by mouth daily. Prolonged use may damage the kidney, Disp: 90 capsule, Rfl: 1   Vitamin A 2400 MCG (8000 UT) TABS, Take 2,400 mg by mouth daily., Disp: , Rfl:    vitamin B-12 (CYANOCOBALAMIN) 1000 MCG tablet, Take 1,000 mcg by mouth daily., Disp: , Rfl:    Garner Nash, DO [facility] 06/02/2021 10:50 AM

## 2021-06-02 NOTE — Progress Notes (Signed)
The proposed treatment discussed in cancer conference is for discussion purpose only and is not a binding recommendation. The patient was not physically examined nor present for their treatment options. Therefore, final treatment plans cannot be decided.  ?

## 2021-06-02 NOTE — Addendum Note (Signed)
Addended by: Vivia Ewing on: 06/02/2021 11:11 AM   Modules accepted: Orders

## 2021-06-02 NOTE — Progress Notes (Signed)
Radiation Oncology         ([phone]) [phone] ________________________________  Multidisciplinary Thoracic Oncology Clinic [facility]) Initial Outpatient Consultation  Name: Jill [name] MRN: [id number]  Date: 06/06/2021  DOB: 1951-09-24  KZ:[id number], Jill [name]  Curt Bears, [name]   REFERRING PHYSICIAN: Curt Bears, [name]  DIAGNOSIS: 70 yo woman with cT2 cN2 cM1b adenocarcinoma of the left upper lung - Stage IVA    ICD-10-CM   1. Primary adenocarcinoma of left upper lobe of lung ([place])  C34.12     2. Secondary malignant neoplasm of brain [facility])  C79.31       HISTORY OF PRESENT ILLNESS::Jill [name] is a 70 y.o. woman, current smoker, since age 70, currently smoking 1 pack/day.  She has medical history of hypertension, hyperlipidemia, UTI, coronary artery disease, MI, stent to the RCA with CABG in 2008.  She had a lung cancer screening CT on 04/28/2021 that revealed a large, 3.1 cm left upper lobe mass with associated mediastinal adenopathy, concerning for primary bronchogenic carcinoma.       Patient was referred to Dr. Valeta Harms in Pulmonology, for evaluation on 05/16/21 and underwent flexible video fiberoptic bronchoscopy with electromagnetic navigation and biopsies on 05/20/21.  Biopsies were obtained from the Left Upper lung lesion and station 7 lymph nodes.  Final pathology was positive for adenocarcinoma in the left upper lobe lung lesion samples while the level 7 node biopsy was non-diagnostic.  PET-CT on 05/31/21 showed the 3.5 cm left upper lobe cavitary lung lesion is hypermetabolic with an SUV max of 7.27 and an adjacent hypermetabolic left hilar lymph node that has an SUV max of 5.80.  Additionally, there is a 10.5 mm pretracheal node that is hypermetabolic with SUV max of 2.35    The patient was referred on 06/02/21 for presentation in the multidisciplinary conference.  Radiology studies and pathology slides were presented there for review and discussion of treatment options.  A  consensus was discussed regarding potential next steps.  Subsequent MRI Brain on 06/03/21 showed a solitary 7 mm ring enhancing brain metastasis in the left frontal brain:    The patient met with Dr. Julien Nordmann earlier today and has been referred for consideration of possible radiotherapy.  PREVIOUS RADIATION THERAPY: No  PAST MEDICAL HISTORY:  has a past medical history of Anxiety, AVM (arteriovenous malformation) brain, Blood transfusion, Cancer ([place]), COPD (chronic obstructive pulmonary disease) ([place]), Coronary artery disease, Depression, Dizziness, Dyspnea, Fatigue, Foot injury (06/01/2017), GERD (gastroesophageal reflux disease), Headache(784.0), Hyperlipidemia, Hypertension, Hypothyroidism, Infected cyst of skin (09/18/2013), Left knee pain (06/05/2012), Lung mass, Myocardial infarction [facility]), Normal nuclear stress test ([place] 2012), Pain, dental (02/06/2017), Poor dental hygiene, Retroperitoneal bleeding, Tobacco abuse, Urine discoloration (09/18/2013), and UTI (lower urinary tract infection) (10/16/2013).    PAST SURGICAL HISTORY: Past Surgical History:  Procedure Laterality Date   CARDIAC CATHETERIZATION  01/02/2007   IT REVEALS MILD INFERIOR WALL HYPOKINESIS. THE EJECTION FRACTION IS AROUND 50%   COLONOSCOPY     CORONARY ARTERY BYPASS GRAFT  11/06/2006   LIMA to LAD, SVG to DX, SVG to LCX & SVG to OM 1 & 2, and SVG to PD   CORONARY STENT PLACEMENT     Remote past stent to RCA   EYE SURGERY Right    cataracts removed   HEMORRHOID SURGERY  11/07/1987   UPPER GI ENDOSCOPY     VENTRICULOSTOMY  10/06/2011   Procedure: VENTRICULOSTOMY;  Surgeon: Winfield Cunas;  Location: [place] [facility];  Service: Neurosurgery;  Laterality: Right;  Insertion of Ventriculostomy Catheter   VIDEO BRONCHOSCOPY WITH ENDOBRONCHIAL NAVIGATION Left 05/20/2021   Procedure: VIDEO BRONCHOSCOPY WITH ENDOBRONCHIAL NAVIGATION;  Surgeon: Garner Nash, DO;  Location: [place];  Service: Pulmonary;  Laterality: Left;    VIDEO BRONCHOSCOPY WITH ENDOBRONCHIAL ULTRASOUND Bilateral 05/20/2021   Procedure: VIDEO BRONCHOSCOPY WITH ENDOBRONCHIAL ULTRASOUND;  Surgeon: Garner Nash, DO;  Location: [facility];  Service: Pulmonary;  Laterality: Bilateral;   WISDOM TOOTH EXTRACTION      FAMILY HISTORY: family history includes Alcohol abuse in her father; Asthma in her father; Cancer (age of onset: 46) in her mother; Diabetes in her brother and mother; Liver cancer in her paternal aunt; Lung cancer in her brother; Ovarian cancer in her maternal aunt.  SOCIAL HISTORY:  reports that she has been smoking cigarettes. She started smoking about 63 years ago. She has a 87.00 pack-year smoking history. She has never used smokeless tobacco. She reports that she does not drink alcohol and does not use drugs.  ALLERGIES: Varenicline, Ace inhibitors, Prednisone, Penicillins, and Sulfa drugs cross reactors  MEDICATIONS:  Current Outpatient Medications  Medication Sig Dispense Refill   amLODipine (NORVASC) 10 MG tablet Take 1 tablet by mouth once daily 90 tablet 1   aspirin EC 81 MG tablet Take 1 tablet (81 mg total) by mouth daily. 90 tablet 2   atorvastatin (LIPITOR) 40 MG tablet Take 1 tablet (40 mg total) by mouth daily. 90 tablet 1   Cholecalciferol (VITAMIN D3) 250 MCG (10000 UT) capsule Take 10,000 mcg by mouth daily.     diphenhydramine-acetaminophen (TYLENOL PM) 25-500 MG TABS tablet Take 1 tablet by mouth every other day. At bedtime     escitalopram (LEXAPRO) 10 MG tablet Take 1 tablet by mouth once daily 90 tablet 1   Fluticasone-Umeclidin-Vilant (TRELEGY ELLIPTA) 100-62.5-25 MCG/INH AEPB Inhale 1 puff into the lungs daily. 1 each 0   levothyroxine (SYNTHROID) 137 MCG tablet Take 1 tablet (137 mcg total) by mouth daily. 30 tablet 1   losartan (COZAAR) 100 MG tablet Take 1 tablet by mouth once daily 90 tablet 1   metoprolol succinate (TOPROL-XL) 50 MG 24 hr tablet TAKE 1 TABLET BY MOUTH ONCE DAILY.  TAKE  WITH  OR  IMMEDIATELY   FOLLOWING  A  MEAL. 90 tablet 1   Multiple Vitamins-Minerals (MULTIVITAMIN WITH MINERALS) tablet Take 1 tablet by mouth daily.     nitroGLYCERIN (NITROSTAT) 0.4 MG SL tablet Place 1 tablet (0.4 mg total) under the tongue every 5 (five) minutes as needed. For chest pain (Patient not taking: Reported on 06/06/2021) 25 tablet 6   Omega-3 Fatty Acids (FISH OIL) 1000 MG CAPS Take 2 capsules (2,000 mg total) by mouth in the morning and at bedtime. 120 capsule 1   omeprazole (PRILOSEC) 20 MG capsule Take 1 capsule (20 mg total) by mouth daily. Prolonged use may damage the kidney 90 capsule 1   prochlorperazine (COMPAZINE) 10 MG tablet Take 1 tablet (10 mg total) by mouth every 6 (six) hours as needed for nausea or vomiting. 30 tablet 0   Vitamin A 2400 MCG (8000 UT) TABS Take 2,400 mg by mouth daily.     vitamin B-12 (CYANOCOBALAMIN) 1000 MCG tablet Take 1,000 mcg by mouth daily.     No current facility-administered medications for this encounter.    REVIEW OF SYSTEMS:  A 15 point review of systems is documented in the electronic medical record. This was obtained by the nursing staff. However, I reviewed this with the patient  to discuss relevant findings and make appropriate changes.  Pertinent items are noted in HPI.   PHYSICAL EXAM:   Per Pulmonary on 05/16/21 Vitals reviewed. Constitutional:      General: She is not in acute distress.    Appearance: She is well-developed. HENT:    Head: Normocephalic and atraumatic.    Mouth/Throat:    Pharynx: No oropharyngeal exudate. Eyes:    Conjunctiva/sclera: Conjunctivae normal.    Pupils: Pupils are equal, round, and reactive to light. Neck:    Vascular: No JVD.    Trachea: No tracheal deviation.    Comments: Loss of supraclavicular fat Cardiovascular:    Rate and Rhythm: Normal rate and regular rhythm.    Heart sounds: S1 normal and S2 normal.    Comments: Distant heart tones Pulmonary:    Effort: No tachypnea or accessory muscle usage.     Breath sounds: No stridor. Decreased breath sounds (throughout all lung fields) present. No wheezing, rhonchi or rales. Abdominal:    General: Bowel sounds are normal. There is no distension.    Palpations: Abdomen is soft.    Tenderness: There is no abdominal tenderness. Musculoskeletal:        General: Deformity (muscle wasting ) present. Skin:    General: Skin is warm and dry.    Capillary Refill: Capillary refill takes less than 2 seconds.    Findings: No rash. Neurological:    Mental Status: She is alert and oriented to person, place, and time. Psychiatric:        Behavior: Behavior normal. NO distinct differences noted on today's visit  KPS = 90  100 - Normal; no complaints; no evidence of disease. 90   - Able to carry on normal activity; minor signs or symptoms of disease. 80   - Normal activity with effort; some signs or symptoms of disease. 33   - Cares for self; unable to carry on normal activity or to do active work. 60   - Requires occasional assistance, but is able to care for most of his personal needs. 50   - Requires considerable assistance and frequent medical care. 28   - Disabled; requires special care and assistance. 35   - Severely disabled; hospital admission is indicated although death not imminent. 63   - Very sick; hospital admission necessary; active supportive treatment necessary. 10   - Moribund; fatal processes progressing rapidly. 0     - Dead  Karnofsky DA, Abelmann Neeses, Craver LS and Burchenal [facility] [phone]) The use of the nitrogen mustards in the palliative treatment of carcinoma: with particular reference to bronchogenic carcinoma Cancer 1 634-56  LABORATORY DATA:  Lab Results  Component Value Date   WBC 8.3 06/06/2021   HGB 12.9 06/06/2021   HCT 38.7 06/06/2021   MCV 95.3 06/06/2021   PLT 302 06/06/2021   Lab Results  Component Value Date   NA 140 06/06/2021   K 4.2 06/06/2021   CL 104 06/06/2021   CO2 29 06/06/2021   Lab Results   Component Value Date   ALT 12 06/06/2021   AST 14 (L) 06/06/2021   ALKPHOS 76 06/06/2021   BILITOT 0.6 06/06/2021    PULMONARY FUNCTION TEST:   Recent Review Flowsheet Data   There is no flowsheet data to display.     RADIOGRAPHY: MR BRAIN W WO CONTRAST  Result Date: 06/04/2021 CLINICAL DATA:  Non-small cell lung cancer staging EXAM: MRI HEAD WITHOUT AND WITH CONTRAST TECHNIQUE: Multiplanar, multiecho pulse sequences of the brain  and surrounding structures were obtained without and with intravenous contrast. CONTRAST:  43m GADAVIST GADOBUTROL 1 MMOL/ML IV SOLN COMPARISON:  Head CT 04/28/2021 FINDINGS: Brain: 7 mm ring-enhancing lesion in the parasagittal high left frontal lobe. No significant vasogenic edema. No acute hemorrhage, hydrocephalus, collection, or infarct. Remote right-sided ventriculostomy with gliosis and chronic blood products along the evacuated tract. 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 Major flow voids are preserved Skull and upper cervical spine: No focal marrow lesion. Degenerative facet spurring. Prior burr hole in the right frontal region for ventriculostomy. Sinuses/Orbits: Sinus opacification most notable in the right sphenoid where there is mucosal thickening and fluid level. IMPRESSION: 1. Positive for single 7 mm metastasis in the left frontal lobe. 2. Active sinusitis. Electronically Signed   By: JMonte FantasiaM.D.   On: 06/04/2021 16:29   NM PET Image Initial (PI) Skull Base To Thigh  Result Date: 06/02/2021 CLINICAL DATA:  Initial treatment strategy for non-small cell lung cancer. EXAM: NUCLEAR MEDICINE PET SKULL BASE TO THIGH TECHNIQUE: 7.35 mCi F-18 FDG was injected intravenously. Full-ring PET imaging was performed from the skull base to thigh after the radiotracer. CT data was obtained and used for attenuation correction and anatomic localization. Fasting blood glucose: 105 mg/dl COMPARISON:  Chest CT 05/18/2021 FINDINGS: Mediastinal  blood pool activity: SUV max 2.24 Liver activity: SUV max NA NECK: No hypermetabolic neck mass or lymphadenopathy. Mild diffuse hypermetabolism in the thyroid gland suggesting thyroiditis. Incidental CT findings: Bilateral carotid artery calcifications. CHEST: The 3.5 cm left upper lobe cavitary lung lesion is hypermetabolic with SUV max of 70.04 Adjacent hypermetabolic left hilar lymph node has an SUV max of 5.80. 10.5 mm pretracheal node is also hypermetabolic with SUV max of 45.99Stable 5 mm left lower lobe pulmonary nodule. No definite hypermetabolism but too small for accurate PET evaluation. No breast masses, supraclavicular or axillary adenopathy. Incidental CT findings: Stable underlying emphysematous changes and pulmonary scarring. Stable advanced vascular calcifications. ABDOMEN/PELVIS: No abnormal hypermetabolic activity within the liver, pancreas, adrenal glands, or spleen. No hypermetabolic lymph nodes in the abdomen or pelvis. Incidental CT findings: Advanced atherosclerotic calcification involving the aorta and iliac arteries but no aneurysm. SKELETON: No findings suspicious for osseous metastatic disease. Incidental CT findings: none IMPRESSION: 1. 3.5 cm left upper lobe cavitary pulmonary lesion is hypermetabolic and consistent with known neoplasm. 2. Left hilar and precarinal metastatic adenopathy. 3. Stable 5 mm left lower lobe pulmonary nodule. 4. No findings for abdominal/pelvic metastatic disease or osseous metastatic disease. 5. Diffuse FDG uptake in the thyroid gland suggesting thyroiditis. Electronically Signed   By: PMarijo SanesM.D.   On: 06/02/2021 05:21   DG CHEST PORT 1 VIEW  Result Date: 05/20/2021 CLINICAL DATA:  Status post bronchoscopy EXAM: PORTABLE CHEST 1 VIEW COMPARISON:  CT chest 05/18/2021 FINDINGS: Lungs are hyperinflated as can be seen with COPD. 3.5 cm left upper lobe pulmonary mass. No focal consolidation. No pleural effusion or pneumothorax. Heart and mediastinal  contours are unremarkable. Prior CABG. No acute osseous abnormality. IMPRESSION: Stable 3.5 cm left upper lobe pulmonary mass most consistent with malignancy. No pneumothorax. Electronically Signed   By: HKathreen Devoid  On: 05/20/2021 12:52   CT Super D Chest Wo Contrast  Result Date: 05/19/2021 CLINICAL DATA:  Super D chest for EMB and biopsy. EXAM: CT CHEST WITHOUT CONTRAST TECHNIQUE: Multidetector CT imaging of the chest was performed using thin slice collimation for electromagnetic bronchoscopy planning purposes, without intravenous contrast. COMPARISON:  Lung cancer  screening chest CT 04/28/2021 FINDINGS: Cardiovascular: The heart is normal in size. No pericardial effusion. Stable tortuosity and calcification of the thoracic aorta and stable surgical changes from coronary artery bypass surgery. Mediastinum/Nodes: Stable enlarged mediastinal and left hilar lymph nodes worrisome for metastatic adenopathy the. The esophagus is grossly normal. Lungs/Pleura: 3.6 x 3.2 x 3.0 cm spiculated left upper lobe lung mass with mild cavitation. 7.5 mm nodule noted in the left upper lobe and best seen on the sagittal images number 65. 4.5 mm left lower lobe pulmonary nodule on image number 73/3. 5 mm left lower lobe nodule along the major fissure on image 82/3 Stable emphysematous changes and pulmonary scarring. No pleural effusions or pleural nodules. Upper Abdomen: No acute upper abdominal findings. Advanced vascular calcifications are noted. Simple exophytic left renal cyst anteriorly. There is a smaller indeterminate nodule projecting off the posterior aspect of the upper pole region of the left kidney which could be a hemorrhagic cyst or solid lesion. This measures 12 mm on image 160/2. Recommend attention on future studies. Musculoskeletal: No significant bony findings. IMPRESSION: 1. 3.6 x 3.2 x 3.0 cm spiculated and mildly cavitary left upper lobe lung mass consistent with primary lung neoplasm. 2. Stable enlarged  mediastinal and left hilar lymph nodes worrisome for metastatic adenopathy. 3. Three left-sided pulmonary nodules as described above. 4. Stable emphysematous changes and pulmonary scarring. 5. Indeterminate 12 mm upper pole left renal nodule. Recommend attention on future studies. 6. Advanced atherosclerotic calcifications involving the aorta and branch vessels. 7. Emphysema and aortic atherosclerosis. Aortic Atherosclerosis (ICD10-I70.0) and Emphysema (ICD10-J43.9). Electronically Signed   By: Marijo Sanes M.D.   On: 05/19/2021 17:00   DG C-ARM BRONCHOSCOPY  Result Date: 05/20/2021 C-ARM BRONCHOSCOPY: Fluoroscopy was utilized by the requesting physician.  No radiographic interpretation.      IMPRESSION: 70 yo woman with cT2 cN2 cM1b adenocarcinoma of the left upper lung - Stage IVA  Her single site of metastatic disease is an asymptomatic subcentimeter brain metastasis.  PLAN:Today, I talked to the patient and family about the findings and work-up thus far.  We discussed the natural history of locally advanced non-small cell lung cancer with oligometastatic disease and general treatment, highlighting the role of radiotherapy in the management.  We discussed the available radiation techniques, and focused on the details of logistics and delivery.  We reviewed the anticipated acute and late sequelae associated with radiation in this setting.  The patient was encouraged to ask questions that I answered to the best of my ability.  I filled out a patient counseling form during our discussion including treatment diagrams.  We retained a copy for our records.  The patient would like to proceed with radiation and will be scheduled for CT simulation for the chest tomorrow at 2 pm.  In addition, we will set up 3T SRS protocol MRI in anticipation of likely definitive SRS to the brain metastasis.  I personally spent 60 minutes in this encounter including chart review, reviewing radiological studies, meeting  face-to-face with the patient, entering orders and completing documentation.  ------------------------------------------------  Sheral Apley. Tammi Klippel, M.D.

## 2021-06-02 NOTE — Patient Instructions (Signed)
Thank you for visiting Dr. Valeta Harms at [facility]. Today we recommend the following:  Samples of trelegy samples  [employer] financial aid application   Return in about 3 months (around 09/02/2021) for Dr. Valeta Harms .    Please do your part to reduce the spread of COVID-19.

## 2021-06-03 ENCOUNTER — Other Ambulatory Visit: Payer: Self-pay | Admitting: Family Medicine

## 2021-06-03 ENCOUNTER — Ambulatory Visit
Admission: RE | Admit: 2021-06-03 | Discharge: 2021-06-03 | Disposition: A | Discharge status: Home / Self Care | Payer: Medicare HMO | Source: Ambulatory Visit | Attending: Pulmonary Disease | Admitting: Pulmonary Disease

## 2021-06-03 DIAGNOSIS — C3490 Malignant neoplasm of unspecified part of unspecified bronchus or lung: Secondary | ICD-10-CM

## 2021-06-03 DIAGNOSIS — G9389 Other specified disorders of brain: Secondary | ICD-10-CM | POA: Diagnosis not present

## 2021-06-03 DIAGNOSIS — Z982 Presence of cerebrospinal fluid drainage device: Secondary | ICD-10-CM | POA: Diagnosis not present

## 2021-06-03 MED ORDER — GADOBUTROL 1 MMOL/ML IV SOLN
6.0000 mL | Freq: Once | INTRAVENOUS | Status: AC | PRN
  Administered 2021-06-03: 6 mL via INTRAVENOUS

## 2021-06-06 ENCOUNTER — Inpatient Hospital Stay: Payer: Medicare HMO

## 2021-06-06 ENCOUNTER — Telehealth: Payer: Self-pay | Admitting: Family Medicine

## 2021-06-06 ENCOUNTER — Telehealth: Payer: Self-pay | Admitting: Pulmonary Disease

## 2021-06-06 ENCOUNTER — Other Ambulatory Visit: Payer: Self-pay | Admitting: Radiation Therapy

## 2021-06-06 ENCOUNTER — Inpatient Hospital Stay: Payer: Medicare HMO | Attending: Internal Medicine | Admitting: Internal Medicine

## 2021-06-06 ENCOUNTER — Encounter: Payer: Self-pay | Admitting: Internal Medicine

## 2021-06-06 ENCOUNTER — Other Ambulatory Visit: Payer: Self-pay

## 2021-06-06 ENCOUNTER — Ambulatory Visit
Admission: RE | Admit: 2021-06-06 | Discharge: 2021-06-06 | Disposition: A | Discharge status: Home / Self Care | Payer: Medicare HMO | Source: Ambulatory Visit | Attending: Radiation Oncology | Admitting: Radiation Oncology

## 2021-06-06 VITALS — BP 145/77 | HR 59 | Temp 97.6°F | Resp 18 | Ht 67.0 in | Wt 129.7 lb

## 2021-06-06 DIAGNOSIS — E785 Hyperlipidemia, unspecified: Secondary | ICD-10-CM | POA: Insufficient documentation

## 2021-06-06 DIAGNOSIS — I10 Essential (primary) hypertension: Secondary | ICD-10-CM | POA: Diagnosis not present

## 2021-06-06 DIAGNOSIS — E039 Hypothyroidism, unspecified: Secondary | ICD-10-CM | POA: Diagnosis not present

## 2021-06-06 DIAGNOSIS — F17210 Nicotine dependence, cigarettes, uncomplicated: Secondary | ICD-10-CM | POA: Diagnosis not present

## 2021-06-06 DIAGNOSIS — M2550 Pain in unspecified joint: Secondary | ICD-10-CM | POA: Diagnosis not present

## 2021-06-06 DIAGNOSIS — R69 Illness, unspecified: Secondary | ICD-10-CM | POA: Diagnosis not present

## 2021-06-06 DIAGNOSIS — Z809 Family history of malignant neoplasm, unspecified: Secondary | ICD-10-CM | POA: Diagnosis not present

## 2021-06-06 DIAGNOSIS — I252 Old myocardial infarction: Secondary | ICD-10-CM | POA: Insufficient documentation

## 2021-06-06 DIAGNOSIS — I2510 Atherosclerotic heart disease of native coronary artery without angina pectoris: Secondary | ICD-10-CM | POA: Insufficient documentation

## 2021-06-06 DIAGNOSIS — Z5111 Encounter for antineoplastic chemotherapy: Secondary | ICD-10-CM | POA: Insufficient documentation

## 2021-06-06 DIAGNOSIS — C7931 Secondary malignant neoplasm of brain: Secondary | ICD-10-CM

## 2021-06-06 DIAGNOSIS — Q282 Arteriovenous malformation of cerebral vessels: Secondary | ICD-10-CM | POA: Insufficient documentation

## 2021-06-06 DIAGNOSIS — R42 Dizziness and giddiness: Secondary | ICD-10-CM | POA: Insufficient documentation

## 2021-06-06 DIAGNOSIS — C3492 Malignant neoplasm of unspecified part of left bronchus or lung: Secondary | ICD-10-CM

## 2021-06-06 DIAGNOSIS — Z8041 Family history of malignant neoplasm of ovary: Secondary | ICD-10-CM | POA: Insufficient documentation

## 2021-06-06 DIAGNOSIS — R0609 Other forms of dyspnea: Secondary | ICD-10-CM | POA: Insufficient documentation

## 2021-06-06 DIAGNOSIS — J449 Chronic obstructive pulmonary disease, unspecified: Secondary | ICD-10-CM | POA: Diagnosis not present

## 2021-06-06 DIAGNOSIS — C3432 Malignant neoplasm of lower lobe, left bronchus or lung: Secondary | ICD-10-CM | POA: Diagnosis not present

## 2021-06-06 DIAGNOSIS — Z79899 Other long term (current) drug therapy: Secondary | ICD-10-CM | POA: Diagnosis not present

## 2021-06-06 DIAGNOSIS — Z836 Family history of other diseases of the respiratory system: Secondary | ICD-10-CM | POA: Diagnosis not present

## 2021-06-06 DIAGNOSIS — Z811 Family history of alcohol abuse and dependence: Secondary | ICD-10-CM | POA: Insufficient documentation

## 2021-06-06 DIAGNOSIS — R634 Abnormal weight loss: Secondary | ICD-10-CM | POA: Diagnosis not present

## 2021-06-06 DIAGNOSIS — R5383 Other fatigue: Secondary | ICD-10-CM | POA: Diagnosis not present

## 2021-06-06 DIAGNOSIS — K219 Gastro-esophageal reflux disease without esophagitis: Secondary | ICD-10-CM | POA: Diagnosis not present

## 2021-06-06 DIAGNOSIS — R630 Anorexia: Secondary | ICD-10-CM | POA: Insufficient documentation

## 2021-06-06 DIAGNOSIS — R590 Localized enlarged lymph nodes: Secondary | ICD-10-CM | POA: Diagnosis not present

## 2021-06-06 DIAGNOSIS — C3412 Malignant neoplasm of upper lobe, left bronchus or lung: Secondary | ICD-10-CM | POA: Insufficient documentation

## 2021-06-06 DIAGNOSIS — R059 Cough, unspecified: Secondary | ICD-10-CM | POA: Diagnosis not present

## 2021-06-06 DIAGNOSIS — Z801 Family history of malignant neoplasm of trachea, bronchus and lung: Secondary | ICD-10-CM | POA: Insufficient documentation

## 2021-06-06 DIAGNOSIS — Z833 Family history of diabetes mellitus: Secondary | ICD-10-CM | POA: Insufficient documentation

## 2021-06-06 DIAGNOSIS — C7951 Secondary malignant neoplasm of bone: Secondary | ICD-10-CM | POA: Diagnosis not present

## 2021-06-06 DIAGNOSIS — R519 Headache, unspecified: Secondary | ICD-10-CM | POA: Insufficient documentation

## 2021-06-06 DIAGNOSIS — R918 Other nonspecific abnormal finding of lung field: Secondary | ICD-10-CM

## 2021-06-06 DIAGNOSIS — Z800 Family history of malignant neoplasm of digestive organs: Secondary | ICD-10-CM | POA: Diagnosis not present

## 2021-06-06 LAB — CBC WITH DIFFERENTIAL (CANCER CENTER ONLY)
Abs Immature Granulocytes: 0.01 10*3/uL (ref 0.00–0.07)
Basophils Absolute: 0.1 10*3/uL (ref 0.0–0.1)
Basophils Relative: 1 %
Eosinophils Absolute: 0.1 10*3/uL (ref 0.0–0.5)
Eosinophils Relative: 2 %
HCT: 38.7 % (ref 36.0–46.0)
Hemoglobin: 12.9 g/dL (ref 12.0–15.0)
Immature Granulocytes: 0 %
Lymphocytes Relative: 23 %
Lymphs Abs: 1.9 10*3/uL (ref 0.7–4.0)
MCH: 31.8 pg (ref 26.0–34.0)
MCHC: 33.3 g/dL (ref 30.0–36.0)
MCV: 95.3 fL (ref 80.0–100.0)
Monocytes Absolute: 0.5 10*3/uL (ref 0.1–1.0)
Monocytes Relative: 6 %
Neutro Abs: 5.6 10*3/uL (ref 1.7–7.7)
Neutrophils Relative %: 68 %
Platelet Count: 302 10*3/uL (ref 150–400)
RBC: 4.06 MIL/uL (ref 3.87–5.11)
RDW: 13.2 % (ref 11.5–15.5)
WBC Count: 8.3 10*3/uL (ref 4.0–10.5)
nRBC: 0 % (ref 0.0–0.2)

## 2021-06-06 LAB — CMP (CANCER CENTER ONLY)
ALT: 12 U/L (ref 0–44)
AST: 14 U/L — ABNORMAL LOW (ref 15–41)
Albumin: 3.7 g/dL (ref 3.5–5.0)
Alkaline Phosphatase: 76 U/L (ref 38–126)
Anion gap: 7 (ref 5–15)
BUN: 10 mg/dL (ref 8–23)
CO2: 29 mmol/L (ref 22–32)
Calcium: 9.7 mg/dL (ref 8.9–10.3)
Chloride: 104 mmol/L (ref 98–111)
Creatinine: 1.09 mg/dL — ABNORMAL HIGH (ref 0.44–1.00)
GFR, Estimated: 54 mL/min — ABNORMAL LOW (ref >60)
Glucose, Bld: 93 mg/dL (ref 70–99)
Potassium: 4.2 mmol/L (ref 3.5–5.1)
Sodium: 140 mmol/L (ref 135–145)
Total Bilirubin: 0.6 mg/dL (ref 0.3–1.2)
Total Protein: 7 g/dL (ref 6.5–8.1)

## 2021-06-06 MED ORDER — PROCHLORPERAZINE MALEATE 10 MG PO TABS: 10.0000 mg | ORAL_TABLET | Freq: Four times a day (QID) | ORAL | 0 refills | Status: DC | PRN

## 2021-06-06 NOTE — Progress Notes (Signed)
Thoracic Location of Tumor / Histology: Lung Cancer Left Upper Lobe  Patient presented for screening CT chest.  MRI Brain 06/03/2021: Positive for single 7 mm metastasis in the left frontal lobe  PET 05/31/2021: 3.5 cm left upper lobe cavitary pulmonary lesion is hypermetabolic and consistent with known neoplasm.  Left hilar and precarinal metastatic adenopathy.  Stable 5 mm left lower lobe pulmonary nodule.  No findings for abdominal/pelvic metastatic disease or osseous metastatic disease.  Biopsies of LUL Lung 05/20/2021      Tobacco/Marijuana/Snuff/ETOH use: Current Smoker  Past/Anticipated interventions by cardiothoracic surgery, if any:   Past/Anticipated interventions by medical oncology, if any:  Dr. Julien Nordmann 06/06/2021 2:15 pm   Signs/Symptoms Weight changes, if any: yes. Lost 40 lbs in 8 months.  Respiratory complaints, if any: coughing Hemoptysis, if any: no Pain issues, if any:  no  SAFETY ISSUES: Prior radiation? no Pacemaker/ICD? no Possible current pregnancy? Postmenopausal Is the patient on methotrexate? no  Current Complaints / other details:  n/a

## 2021-06-06 NOTE — Patient Instructions (Signed)
Steps to Quit Smoking Smoking tobacco is the leading cause of preventable death. It can affect almost every organ in the body. Smoking puts you and people around you at risk for many serious, long-lasting (chronic) diseases. Quitting smoking can be hard, but it is one of the best things that you can do for your health. It is never too late to quit. How do I get ready to quit? When you decide to quit smoking, make a plan to help you succeed. Before you quit: Pick a date to quit. Set a date within the next 2 weeks to give you time to prepare. Write down the reasons why you are quitting. Keep this list in places where you will see it often. Tell your family, friends, and co-workers that you are quitting. Their support is important. Talk with your doctor about the choices that may help you quit. Find out if your health insurance will pay for these treatments. Know the people, places, things, and activities that make you want to smoke (triggers). Avoid them. What first steps can I take to quit smoking? Throw away all cigarettes at home, at work, and in your car. Throw away the things that you use when you smoke, such as ashtrays and lighters. Clean your car. Make sure to empty the ashtray. Clean your home, including curtains and carpets. What can I do to help me quit smoking? Talk with your doctor about taking medicines and seeing a counselor at the same time. You are more likely to succeed when you do both. If you are pregnant or breastfeeding, talk with your doctor about counseling or other ways to quit smoking. Do not take medicine to help you quit smoking unless your doctor tells you to do so. To quit smoking: Quit right away Quit smoking totally, instead of slowly cutting back on how much you smoke over a period of time. Go to counseling. You are more likely to quit if you go to counseling sessions regularly. Take medicine You may take medicines to help you quit. Some medicines need a  prescription, and some you can buy over-the-counter. Some medicines may contain a drug called nicotine to replace the nicotine in cigarettes. Medicines may: Help you to stop having the desire to smoke (cravings). Help to stop the problems that come when you stop smoking (withdrawal symptoms). Your doctor may ask you to use: Nicotine patches, gum, or lozenges. Nicotine inhalers or sprays. Non-nicotine medicine that is taken by mouth. Find resources Find resources and other ways to help you quit smoking and remain smoke-free after you quit. These resources are most helpful when you use them often. They include: Online chats with a counselor. Phone quitlines. Printed self-help materials. Support groups or group counseling. Text messaging programs. Mobile phone apps. Use apps on your mobile phone or tablet that can help you stick to your quit plan. There are many free apps for mobile phones and tablets as well as websites. Examples include Quit Guide from the CDC and smokefree.gov  What things can I do to make it easier to quit?  Talk to your family and friends. Ask them to support and encourage you. Call a phone quitline (1-800-QUIT-NOW), reach out to support groups, or work with a counselor. Ask people who smoke to not smoke around you. Avoid places that make you want to smoke, such as: Bars. Parties. Smoke-break areas at work. Spend time with people who do not smoke. Lower the stress in your life. Stress can make you want to   smoke. Try these things to help your stress: Getting regular exercise. Doing deep-breathing exercises. Doing yoga. Meditating. Doing a body scan. To do this, close your eyes, focus on one area of your body at a time from head to toe. Notice which parts of your body are tense. Try to relax the muscles in those areas. How will I feel when I quit smoking? Day 1 to 3 weeks Within the first 24 hours, you may start to have some problems that come from quitting tobacco.  These problems are very bad 2-3 days after you quit, but they do not often last for more than 2-3 weeks. You may get these symptoms: Mood swings. Feeling restless, nervous, angry, or annoyed. Trouble concentrating. Dizziness. Strong desire for high-sugar foods and nicotine. Weight gain. Trouble pooping (constipation). Feeling like you may vomit (nausea). Coughing or a sore throat. Changes in how the medicines that you take for other issues work in your body. Depression. Trouble sleeping (insomnia). Week 3 and afterward After the first 2-3 weeks of quitting, you may start to notice more positive results, such as: Better sense of smell and taste. Less coughing and sore throat. Slower heart rate. Lower blood pressure. Clearer skin. Better breathing. Fewer sick days. Quitting smoking can be hard. Do not give up if you fail the first time. Some people need to try a few times before they succeed. Do your best to stick to your quit plan, and talk with your doctor if you have any questions or concerns. Summary Smoking tobacco is the leading cause of preventable death. Quitting smoking can be hard, but it is one of the best things that you can do for your health. When you decide to quit smoking, make a plan to help you succeed. Quit smoking right away, not slowly over a period of time. When you start quitting, seek help from your doctor, family, or friends. This information is not intended to replace advice given to you by your health care provider. Make sure you discuss any questions you have with your health care provider. Document Revised: 07/18/2019 Document Reviewed: 01/11/2019 [employer] Patient Education  2022 [employer]  

## 2021-06-06 NOTE — Progress Notes (Signed)
[place] [phone]   Fax:([phone]) [phone]  CONSULT NOTE  REFERRING PHYSICIAN: Dr. Leory Plowman Icard  REASON FOR CONSULTATION:  70 years old white female recently diagnosed with lung cancer.  HPI Jill [name] is a 70 y.o. female with past medical history significant for multiple medical problems including history of COPD, hypertension, GERD, depression, AV malformation of the brain, dyslipidemia, hypothyroidism, myocardial infarction, depression and retroperitoneal bleeding as well as long history of smoking.  The patient had CT screening of the chest on 04/28/2021 and it showed suspicious left upper lobe 3.2 cm opacity in addition to left lower lobe nodule and low right paratracheal lymph node measuring 1.1 cm.  The patient was seen by Dr. Valeta Harms and CT super D of the chest on May 18, 2021 showed 3.6 x 3.2 x 3.0 cm spiculated and mildly cavitary left upper lobe lung mass consistent with primary lung neoplasm.  There was a stable enlarged mediastinal and left hilar lymph node worrisome for metastatic adenopathy and 3 left-sided pulmonary nodules measuring 0.75 in the left upper lobe, 0.45 left lower lobe and 0.5 left lower lobe nodule along the major fissure.  On May 20, 2021 the patient underwent bronchoscopy with electromagnetic navigation bronchoscopy and endobronchial ultrasound and biopsies under the care of Dr. Valeta Harms and the final pathology ([id number]) showed malignant cells consistent with non-small cell carcinoma. By immunohistochemistry, the neoplastic cells are positive for TTF-1 and Napsin a but negative for p40 and cytokeratin 5/6.  The cytology and immunophenotype support the diagnosis of adenocarcinoma.  The patient had a PET scan on May 31, 2021 and it showed 3.5 cm left upper lobe cavitary pulmonary lesion and consistent with the known neoplasm.  There was left hilar and precarinal metastatic adenopathy and stable 0.5 cm left lower lobe pulmonary  nodule.  No findings for abdominal/pelvic metastatic disease or osseous metastatic disease.  The patient also had MRI of the brain on June 03, 2021 and it was positive for single 0.7 cm metastasis in the left frontal lobe. Dr. Valeta Harms kindly referred the patient to me today for evaluation and recommendation regarding treatment of her condition. When seen today the patient is feeling fine except for intermittent headache and dizzy spells as well as cough and shortness of breath with exertion.  She also has intermittent chest tightness.  She lost around 40 pounds in the last 7 months.  She has no nausea, vomiting, diarrhea or constipation.  She denied having any fever or chills. Family history significant for mother with breast cancer.  Father had alcoholic liver cirrhosis.  Brother had lung cancer and maternal aunt had ovarian cancer. The patient is married and has 5 children.  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  She was accompanied today by her daughter 34.  The patient has a history of smoking up to 2 pack/day for around 63 years and unfortunately she continues to smoke.  No history of alcohol or drug abuse.    HPI  Past Medical History:  Diagnosis Date   Anxiety    ON PAXIL, XANAX   AVM (arteriovenous malformation) brain    s/p stent/coil   Blood transfusion    x 2   Cancer (HCC)    Left lung   COPD (chronic obstructive pulmonary disease) (HCC)    no inhaler, no oxygen   Coronary artery disease    Prior inferior MI with stent to RCA, s/p CABG in 2008   Depression  Dizziness    Dyspnea    with exertion, no oxygen   Fatigue    Foot injury 06/01/2017   right - RESOLVED, no longer an issue per patient 05/18/21   GERD (gastroesophageal reflux disease)    Headache(784.0)    UNRUPTURED CEREBRAL ANEURYSM   Hyperlipidemia    Hypertension    Hypothyroidism    Infected cyst of skin 09/18/2013   Left knee pain 06/05/2012   Lung mass    Left lung   Myocardial  infarction [facility])    Normal nuclear stress test Ju;y 2012   No ischemia. EF 70%; fixed defect involving septum, inferoseptal and inferior wall   Pain, dental 02/06/2017   Poor dental hygiene    Retroperitoneal bleeding    Following cardiac cath   Tobacco abuse    Urine discoloration 09/18/2013   UTI (lower urinary tract infection) 10/16/2013    Past Surgical History:  Procedure Laterality Date   CARDIAC CATHETERIZATION  01/02/2007   IT REVEALS MILD INFERIOR WALL HYPOKINESIS. THE EJECTION FRACTION IS AROUND 50%   COLONOSCOPY     CORONARY ARTERY BYPASS GRAFT  11/06/2006   LIMA to LAD, SVG to DX, SVG to LCX & SVG to OM 1 & 2, and SVG to PD   CORONARY STENT PLACEMENT     Remote past stent to RCA   EYE SURGERY Right    cataracts removed   HEMORRHOID SURGERY  11/07/1987   UPPER GI ENDOSCOPY     VENTRICULOSTOMY  10/06/2011   Procedure: VENTRICULOSTOMY;  Surgeon: Winfield Cunas;  Location: [place] [facility];  Service: Neurosurgery;  Laterality: Right;  Insertion of Ventriculostomy Catheter   VIDEO BRONCHOSCOPY WITH ENDOBRONCHIAL NAVIGATION Left 05/20/2021   Procedure: VIDEO BRONCHOSCOPY WITH ENDOBRONCHIAL NAVIGATION;  Surgeon: Garner Nash, DO;  Location: [place];  Service: Pulmonary;  Laterality: Left;   VIDEO BRONCHOSCOPY WITH ENDOBRONCHIAL ULTRASOUND Bilateral 05/20/2021   Procedure: VIDEO BRONCHOSCOPY WITH ENDOBRONCHIAL ULTRASOUND;  Surgeon: Garner Nash, DO;  Location: [place];  Service: Pulmonary;  Laterality: Bilateral;   WISDOM TOOTH EXTRACTION      Family History  Problem Relation Age of Onset   Cancer Mother 33   Diabetes Mother    Alcohol abuse Father    Asthma Father    Diabetes Brother    Lung cancer Brother    Ovarian cancer Maternal Aunt    Liver cancer Paternal Aunt    Heart disease Neg Hx     Social History Social History   Tobacco Use   Smoking status: Every Day    Packs/day: 1.50    Years: 58.00    Pack years: 87.00    Types: Cigarettes    Start date:  11/06/1957   Smokeless tobacco: Never   Tobacco comments:    Has smoked 3 ppd, current 1.5-2 ppd  Vaping Use   Vaping Use: Never used  Substance Use Topics   Alcohol use: No   Drug use: No    Allergies  Allergen Reactions   Varenicline Other (See Comments)    Reports change in mood with increased irritability and homicidal ideation.  2007    Ace Inhibitors Cough   Prednisone Other (See Comments)    Crying, disorientation - occurred in 1994 Rage   Penicillins Rash    Reaction: Childhood   Sulfa Drugs Cross Reactors Rash    Current Outpatient Medications  Medication Sig Dispense Refill   amLODipine (NORVASC) 10 MG tablet Take 1 tablet by mouth once daily 90 tablet  1   aspirin EC 81 MG tablet Take 1 tablet (81 mg total) by mouth daily. 90 tablet 2   atorvastatin (LIPITOR) 40 MG tablet Take 1 tablet (40 mg total) by mouth daily. 90 tablet 1   Cholecalciferol (VITAMIN D3) 250 MCG (10000 UT) capsule Take 10,000 mcg by mouth daily.     diphenhydramine-acetaminophen (TYLENOL PM) 25-500 MG TABS tablet Take 1 tablet by mouth every other day. At bedtime     escitalopram (LEXAPRO) 10 MG tablet Take 1 tablet by mouth once daily 90 tablet 1   Fluticasone-Umeclidin-Vilant (TRELEGY ELLIPTA) 100-62.5-25 MCG/INH AEPB Inhale 1 puff into the lungs daily. 1 each 0   levothyroxine (SYNTHROID) 137 MCG tablet Take 1 tablet (137 mcg total) by mouth daily. 30 tablet 1   losartan (COZAAR) 100 MG tablet Take 1 tablet by mouth once daily 90 tablet 1   metoprolol succinate (TOPROL-XL) 50 MG 24 hr tablet TAKE 1 TABLET BY MOUTH ONCE DAILY.  TAKE  WITH  OR  IMMEDIATELY  FOLLOWING  A  MEAL. 90 tablet 1   Multiple Vitamins-Minerals (MULTIVITAMIN WITH MINERALS) tablet Take 1 tablet by mouth daily.     Omega-3 Fatty Acids (FISH OIL) 1000 MG CAPS Take 2 capsules (2,000 mg total) by mouth in the morning and at bedtime. 120 capsule 1   omeprazole (PRILOSEC) 20 MG capsule Take 1 capsule (20 mg total) by mouth daily.  Prolonged use may damage the kidney 90 capsule 1   Vitamin A 2400 MCG (8000 UT) TABS Take 2,400 mg by mouth daily.     vitamin B-12 (CYANOCOBALAMIN) 1000 MCG tablet Take 1,000 mcg by mouth daily.     nitroGLYCERIN (NITROSTAT) 0.4 MG SL tablet Place 1 tablet (0.4 mg total) under the tongue every 5 (five) minutes as needed. For chest pain (Patient not taking: Reported on 06/06/2021) 25 tablet 6   No current facility-administered medications for this visit.    Review of Systems  Constitutional: positive for anorexia, fatigue, and weight loss Eyes: negative Ears, nose, mouth, throat, and face: negative Respiratory: positive for cough and dyspnea on exertion Cardiovascular: negative Gastrointestinal: negative Genitourinary:negative Integument/breast: negative Hematologic/lymphatic: negative Musculoskeletal:positive for arthralgias Neurological: positive for dizziness and headaches Behavioral/Psych: negative Endocrine: negative Allergic/Immunologic: negative  Physical Exam  DJM:EQAST, healthy, no distress, well nourished, and well developed SKIN: skin color, texture, turgor are normal, no rashes or significant lesions HEAD: Normocephalic, No masses, lesions, tenderness or abnormalities EYES: normal, PERRLA, Conjunctiva are pink and non-injected EARS: External ears normal, Canals clear OROPHARYNX:no exudate, no erythema, and lips, buccal mucosa, and tongue normal  NECK: supple, no adenopathy, no JVD LYMPH:  no palpable lymphadenopathy, no hepatosplenomegaly BREAST:not examined LUNGS: clear to auscultation , and palpation HEART: regular rate & rhythm, no murmurs, and no gallops ABDOMEN:abdomen soft, non-tender, normal bowel sounds, and no masses or organomegaly BACK: No CVA tenderness, Range of motion is normal EXTREMITIES:no joint deformities, effusion, or inflammation, no edema  NEURO: alert & oriented x 3 with fluent speech, no focal motor/sensory deficits  PERFORMANCE STATUS:  ECOG 1  LABORATORY DATA: Lab Results  Component Value Date   WBC 8.3 06/06/2021   HGB 12.9 06/06/2021   HCT 38.7 06/06/2021   MCV 95.3 06/06/2021   PLT 302 06/06/2021      Chemistry      Component Value Date/Time   NA 140 06/06/2021 1353   NA 141 04/12/2021 1356   K 4.2 06/06/2021 1353   CL 104 06/06/2021 1353   CO2 29  06/06/2021 1353   BUN 10 06/06/2021 1353   BUN 13 04/12/2021 1356   CREATININE 1.09 (H) 06/06/2021 1353   CREATININE 0.99 08/29/2016 1130      Component Value Date/Time   CALCIUM 9.7 06/06/2021 1353   ALKPHOS 76 06/06/2021 1353   AST 14 (L) 06/06/2021 1353   ALT 12 06/06/2021 1353   BILITOT 0.6 06/06/2021 1353       RADIOGRAPHIC STUDIES: MR BRAIN W WO CONTRAST  Result Date: 06/04/2021 CLINICAL DATA:  Non-small cell lung cancer staging EXAM: MRI HEAD WITHOUT AND WITH CONTRAST TECHNIQUE: Multiplanar, multiecho pulse sequences of the brain and surrounding structures were obtained without and with intravenous contrast. CONTRAST:  10m GADAVIST GADOBUTROL 1 MMOL/ML IV SOLN COMPARISON:  Head CT 04/28/2021 FINDINGS: Brain: 7 mm ring-enhancing lesion in the parasagittal high left frontal lobe. No significant vasogenic edema. No acute hemorrhage, hydrocephalus, collection, or infarct. Remote right-sided ventriculostomy with gliosis and chronic blood products along the evacuated tract. 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 Major flow voids are preserved Skull and upper cervical spine: No focal marrow lesion. Degenerative facet spurring. Prior burr hole in the right frontal region for ventriculostomy. Sinuses/Orbits: Sinus opacification most notable in the right sphenoid where there is mucosal thickening and fluid level. IMPRESSION: 1. Positive for single 7 mm metastasis in the left frontal lobe. 2. Active sinusitis. Electronically Signed   By: JMonte FantasiaM.D.   On: 06/04/2021 16:29   NM PET Image Initial (PI) Skull Base To Thigh  Result Date:  06/02/2021 CLINICAL DATA:  Initial treatment strategy for non-small cell lung cancer. EXAM: NUCLEAR MEDICINE PET SKULL BASE TO THIGH TECHNIQUE: 7.35 mCi F-18 FDG was injected intravenously. Full-ring PET imaging was performed from the skull base to thigh after the radiotracer. CT data was obtained and used for attenuation correction and anatomic localization. Fasting blood glucose: 105 mg/dl COMPARISON:  Chest CT 05/18/2021 FINDINGS: Mediastinal blood pool activity: SUV max 2.24 Liver activity: SUV max NA NECK: No hypermetabolic neck mass or lymphadenopathy. Mild diffuse hypermetabolism in the thyroid gland suggesting thyroiditis. Incidental CT findings: Bilateral carotid artery calcifications. CHEST: The 3.5 cm left upper lobe cavitary lung lesion is hypermetabolic with SUV max of 79.51 Adjacent hypermetabolic left hilar lymph node has an SUV max of 5.80. 10.5 mm pretracheal node is also hypermetabolic with SUV max of 48.84Stable 5 mm left lower lobe pulmonary nodule. No definite hypermetabolism but too small for accurate PET evaluation. No breast masses, supraclavicular or axillary adenopathy. Incidental CT findings: Stable underlying emphysematous changes and pulmonary scarring. Stable advanced vascular calcifications. ABDOMEN/PELVIS: No abnormal hypermetabolic activity within the liver, pancreas, adrenal glands, or spleen. No hypermetabolic lymph nodes in the abdomen or pelvis. Incidental CT findings: Advanced atherosclerotic calcification involving the aorta and iliac arteries but no aneurysm. SKELETON: No findings suspicious for osseous metastatic disease. Incidental CT findings: none IMPRESSION: 1. 3.5 cm left upper lobe cavitary pulmonary lesion is hypermetabolic and consistent with known neoplasm. 2. Left hilar and precarinal metastatic adenopathy. 3. Stable 5 mm left lower lobe pulmonary nodule. 4. No findings for abdominal/pelvic metastatic disease or osseous metastatic disease. 5. Diffuse FDG uptake in  the thyroid gland suggesting thyroiditis. Electronically Signed   By: PMarijo SanesM.D.   On: 06/02/2021 05:21   DG CHEST PORT 1 VIEW  Result Date: 05/20/2021 CLINICAL DATA:  Status post bronchoscopy EXAM: PORTABLE CHEST 1 VIEW COMPARISON:  CT chest 05/18/2021 FINDINGS: Lungs are hyperinflated as can be seen with COPD. 3.5  cm left upper lobe pulmonary mass. No focal consolidation. No pleural effusion or pneumothorax. Heart and mediastinal contours are unremarkable. Prior CABG. No acute osseous abnormality. IMPRESSION: Stable 3.5 cm left upper lobe pulmonary mass most consistent with malignancy. No pneumothorax. Electronically Signed   By: Kathreen Devoid   On: 05/20/2021 12:52   CT Super D Chest Wo Contrast  Result Date: 05/19/2021 CLINICAL DATA:  Super D chest for EMB and biopsy. EXAM: CT CHEST WITHOUT CONTRAST TECHNIQUE: Multidetector CT imaging of the chest was performed using thin slice collimation for electromagnetic bronchoscopy planning purposes, without intravenous contrast. COMPARISON:  Lung cancer screening chest CT 04/28/2021 FINDINGS: Cardiovascular: The heart is normal in size. No pericardial effusion. Stable tortuosity and calcification of the thoracic aorta and stable surgical changes from coronary artery bypass surgery. Mediastinum/Nodes: Stable enlarged mediastinal and left hilar lymph nodes worrisome for metastatic adenopathy the. The esophagus is grossly normal. Lungs/Pleura: 3.6 x 3.2 x 3.0 cm spiculated left upper lobe lung mass with mild cavitation. 7.5 mm nodule noted in the left upper lobe and best seen on the sagittal images number 65. 4.5 mm left lower lobe pulmonary nodule on image number 73/3. 5 mm left lower lobe nodule along the major fissure on image 82/3 Stable emphysematous changes and pulmonary scarring. No pleural effusions or pleural nodules. Upper Abdomen: No acute upper abdominal findings. Advanced vascular calcifications are noted. Simple exophytic left renal cyst  anteriorly. There is a smaller indeterminate nodule projecting off the posterior aspect of the upper pole region of the left kidney which could be a hemorrhagic cyst or solid lesion. This measures 12 mm on image 160/2. Recommend attention on future studies. Musculoskeletal: No significant bony findings. IMPRESSION: 1. 3.6 x 3.2 x 3.0 cm spiculated and mildly cavitary left upper lobe lung mass consistent with primary lung neoplasm. 2. Stable enlarged mediastinal and left hilar lymph nodes worrisome for metastatic adenopathy. 3. Three left-sided pulmonary nodules as described above. 4. Stable emphysematous changes and pulmonary scarring. 5. Indeterminate 12 mm upper pole left renal nodule. Recommend attention on future studies. 6. Advanced atherosclerotic calcifications involving the aorta and branch vessels. 7. Emphysema and aortic atherosclerosis. Aortic Atherosclerosis (ICD10-I70.0) and Emphysema (ICD10-J43.9). Electronically Signed   By: Marijo Sanes M.D.   On: 05/19/2021 17:00   DG C-ARM BRONCHOSCOPY  Result Date: 05/20/2021 C-ARM BRONCHOSCOPY: Fluoroscopy was utilized by the requesting physician.  No radiographic interpretation.    ASSESSMENT: This is a very pleasant 70 years old white female recently diagnosed with a stage IV (T2a, N2, M1 B) non-small cell lung cancer, adenocarcinoma presented with left upper lobe lung mass in addition to left hilar and mediastinal lymphadenopathy and solitary 0.7 cm left frontal brain metastasis diagnosed in July 2022.   PLAN: I had a lengthy discussion with the patient and her daughter today about her current disease stage, prognosis and treatment options. I explained to the patient that she has incurable condition and all the treatment will be of palliative nature but because of the solitary small brain metastasis, we may consider an aggressive course of treatment for her condition including SRS to the brain lesion followed by a course of concurrent chemoradiation  with weekly carboplatin for AUC of 2 and paclitaxel 45 Mg/M2 for 6-7 weeks followed by either systemic chemotherapy or consolidation immunotherapy. I also explained to the patient that the standard of care is just systemic chemotherapy with immunotherapy but because of her unique situation with the small solitary brain metastasis, we would consider the  aggressive approach to the locally advanced disease in the lung as well as the SRS to the brain. The patient is interested in this option. I will send blood test to [facility] followed by tissue and PD-L1 if no actionable mutation on the blood test. She is expected to start the first cycle of her concurrent chemoradiation on June 13, 2021. I discussed with the patient the adverse effect of this treatment including but not limited to alopecia, myelosuppression, nausea and vomiting, peripheral neuropathy, liver or renal dysfunction. The patient will have a chemotherapy education class before the first dose of her treatment. She is scheduled to see Dr. Tammi Klippel later today for discussion of the radiotherapy option. The patient will come back for follow-up visit in 2 weeks for evaluation and management of any adverse effect of her treatment. She was advised to call immediately if she has any other concerning symptoms in the interval. The patient voices understanding of current disease status and treatment options and is in agreement with the current care plan.  All questions were answered. The patient knows to call the clinic with any problems, questions or concerns. We can certainly see the patient much sooner if necessary.  Thank you so much for allowing me to participate in the care of Jill [name]. I will continue to follow up the patient with you and assist in her care.  The total time spent in the appointment was 90 minutes.  Disclaimer: This note was dictated with voice recognition software. Similar sounding words can inadvertently be  transcribed and may not be corrected upon review.   Eilleen Kempf June 06, 2021, 2:50 PM

## 2021-06-06 NOTE — Telephone Encounter (Signed)
Spoke with Olivia Mackie with [facility]  MRI brain 06/04/21- impression:   IMPRESSION: 1. Positive for single 7 mm metastasis in the left frontal lobe. 2. Active sinusitis.     Electronically Signed   By: Monte Fantasia M.D.   On: 06/04/2021 16:29   Forwarding to Dr Valeta Harms urgent

## 2021-06-06 NOTE — Progress Notes (Signed)
START ON PATHWAY REGIMEN - Non-Small Cell Lung     Administer weekly:     Paclitaxel      Carboplatin   **Always confirm dose/schedule in your pharmacy ordering system**  Patient Characteristics: Preoperative or Nonsurgical Candidate (Clinical Staging), Stage III - Nonsurgical Candidate (Nonsquamous and Squamous), PS = 0, 1 Therapeutic Status: Preoperative or Nonsurgical Candidate (Clinical Staging) AJCC T Category: cT2a AJCC N Category: cN2 AJCC M Category: cM0 AJCC 8 Stage Grouping: IIIA ECOG Performance Status: 1 Intent of Therapy: Non-Curative / Palliative Intent, Discussed with Patient

## 2021-06-06 NOTE — Telephone Encounter (Signed)
I received a medication non-adherence report from her pharmacy. I called to check-in and she affirms compliance with Losartan.  We discussed her Lung cancer diagnosis. She has f/u with pulm and oncology. She will follow-up with me soon to address her weight loss. Support provided during this conversation.

## 2021-06-07 ENCOUNTER — Ambulatory Visit
Admission: RE | Admit: 2021-06-07 | Discharge: 2021-06-07 | Disposition: A | Discharge status: Home / Self Care | Payer: Medicare HMO | Source: Ambulatory Visit | Attending: Radiation Oncology | Admitting: Radiation Oncology

## 2021-06-07 VITALS — BP 128/72 | HR 59 | Temp 97.8°F | Resp 18 | Ht 67.0 in | Wt 129.4 lb

## 2021-06-07 DIAGNOSIS — Z510 Encounter for antineoplastic radiation therapy: Secondary | ICD-10-CM | POA: Diagnosis not present

## 2021-06-07 DIAGNOSIS — C3490 Malignant neoplasm of unspecified part of unspecified bronchus or lung: Secondary | ICD-10-CM

## 2021-06-07 DIAGNOSIS — C7931 Secondary malignant neoplasm of brain: Secondary | ICD-10-CM | POA: Insufficient documentation

## 2021-06-07 DIAGNOSIS — C3412 Malignant neoplasm of upper lobe, left bronchus or lung: Secondary | ICD-10-CM

## 2021-06-07 MED ORDER — SODIUM CHLORIDE 0.9% FLUSH
10.0000 mL | Freq: Once | INTRAVENOUS | Status: AC
  Administered 2021-06-07: 10 mL via INTRAVENOUS

## 2021-06-07 NOTE — Progress Notes (Signed)
  Radiation Oncology         [phone] ________________________________  Name: Jill [name] MRN: [id number]  Date: 06/07/2021  DOB: Nov 20, 1950  SIMULATION AND TREATMENT PLANNING NOTE    ICD-10-CM   1. Primary adenocarcinoma of left upper lobe of lung ([place])  C34.12       DIAGNOSIS:  70 yo woman with cT2 cN2 cM1b adenocarcinoma of the left upper lung - Stage IVA - with solitary left frontal 7 mm brain metastasis  NARRATIVE:  The patient was brought to the [facility].  Identity was confirmed.  All relevant records and images related to the planned course of therapy were reviewed.  The patient freely provided informed written consent to proceed with treatment after reviewing the details related to the planned course of therapy. The consent form was witnessed and verified by the simulation staff.  Then, the patient was set-up in a stable reproducible  supine position for radiation therapy.  CT images were obtained.  Surface markings were placed.  The CT images were loaded into the planning software.  Then the target and avoidance structures were contoured.  Treatment planning then occurred.  The radiation prescription was entered and confirmed.  Then, I designed and supervised the construction of a total of 6 medically necessary complex treatment devices, including a BodyFix immobilization mold custom fitted to the patient along with 5 multileaf collimators conformally shaped radiation around the treatment target while shielding critical structures such as the heart and spinal cord maximally.  I have requested : 3D Simulation  I have requested a DVH of the following structures: Left lung, right lung, spinal cord, heart, esophagus, and target.  I have ordered:Nutrition Consult  SPECIAL TREATMENT PROCEDURE:  The planned course of therapy using radiation constitutes a special treatment procedure. Special care is required in the management of this patient for the following reasons.  The  patient will be receiving concurrent chemotherapy requiring careful monitoring for increased toxicities of treatment including periodic laboratory values.  The special nature of the planned course of radiotherapy will require increased physician supervision and oversight to ensure patient's safety with optimal treatment outcomes.  PLAN:  The patient will receive 66 Gy in 33 fractions concurrent with her definitive SRS for her brain metastasis.  ________________________________  Sheral Apley Tammi Klippel, M.D.

## 2021-06-07 NOTE — Progress Notes (Signed)
  Radiation Oncology         [phone]) [phone] ________________________________  Name: Jill [name] MRN: [id number]  Date: 06/07/2021  DOB: 1951/01/16  SIMULATION AND TREATMENT PLANNING NOTE    ICD-10-CM   1. Primary malignant neoplasm of lung with metastasis to brain [facility])  C34.90    C79.31       DIAGNOSIS:  70 yo woman with cT2 cN2 cM1b adenocarcinoma of the left upper lung - Stage IVA - with solitary left frontal 7 mm brain metastasis  NARRATIVE:  The patient was brought to the [facility].  Identity was confirmed.  All relevant records and images related to the planned course of therapy were reviewed.  The patient freely provided informed written consent to proceed with treatment after reviewing the details related to the planned course of therapy. The consent form was witnessed and verified by the simulation staff. Intravenous access was established for contrast administration. Then, the patient was set-up in a stable reproducible supine position for radiation therapy.  A relocatable thermoplastic stereotactic head frame was fabricated for precise immobilization.  CT images were obtained.  Surface markings were placed.  The CT images were loaded into the planning software and fused with the patient's targeting MRI scan.  Then the target and avoidance structures were contoured.  Treatment planning then occurred.  The radiation prescription was entered and confirmed.  I have requested 3D planning  I have requested a DVH of the following structures: Brain stem, brain, left eye, right eye, lenses, optic chiasm, target volumes, uninvolved brain, and normal tissue.    SPECIAL TREATMENT PROCEDURE:  The planned course of therapy using radiation constitutes a special treatment procedure. Special care is required in the management of this patient for the following reasons. This treatment constitutes a Special Treatment Procedure for the following reason: High dose per fraction requiring  special monitoring for increased toxicities of treatment including daily imaging.  The special nature of the planned course of radiotherapy will require increased physician supervision and oversight to ensure patient's safety with optimal treatment outcomes.  This requires extended time and effort.  PLAN:  The patient will receive 20 Gy in 1 fraction to her brain metastasis, concurrent with her conventionally fractionated thoracic chemoradiation.  ________________________________  Sheral Apley Tammi Klippel, M.D.

## 2021-06-07 NOTE — Progress Notes (Signed)
Has armband been applied?  Yes.    Does patient have an allergy to IV contrast dye?: No.   Has patient ever received premedication for IV contrast dye?: No.   Does patient take metformin?: No.  Date of lab work: June 06, 2021 BUN: 10 CR: 1.09 eGFR: 54  IV site: wrist right, condition patent and no redness  Has IV site been added to flowsheet?  Yes.    Vitals:   06/07/21 1314  BP: 128/72  Pulse: (!) 59  Resp: 18  Temp: 97.8 F (36.6 C)  SpO2: 97%

## 2021-06-08 ENCOUNTER — Other Ambulatory Visit: Payer: Medicare HMO

## 2021-06-09 ENCOUNTER — Ambulatory Visit
Admission: RE | Admit: 2021-06-09 | Discharge: 2021-06-09 | Disposition: A | Discharge status: Home / Self Care | Payer: Medicare HMO | Source: Ambulatory Visit | Attending: Radiation Oncology | Admitting: Radiation Oncology

## 2021-06-09 DIAGNOSIS — C7931 Secondary malignant neoplasm of brain: Secondary | ICD-10-CM

## 2021-06-09 DIAGNOSIS — C3490 Malignant neoplasm of unspecified part of unspecified bronchus or lung: Secondary | ICD-10-CM | POA: Diagnosis not present

## 2021-06-09 DIAGNOSIS — Z510 Encounter for antineoplastic radiation therapy: Secondary | ICD-10-CM | POA: Diagnosis not present

## 2021-06-09 DIAGNOSIS — C3412 Malignant neoplasm of upper lobe, left bronchus or lung: Secondary | ICD-10-CM | POA: Diagnosis not present

## 2021-06-09 DIAGNOSIS — G939 Disorder of brain, unspecified: Secondary | ICD-10-CM | POA: Diagnosis not present

## 2021-06-09 MED ORDER — GADOBENATE DIMEGLUMINE 529 MG/ML IV SOLN
12.0000 mL | Freq: Once | INTRAVENOUS | Status: AC | PRN
  Administered 2021-06-09: 12 mL via INTRAVENOUS

## 2021-06-10 ENCOUNTER — Telehealth: Payer: Self-pay | Admitting: Internal Medicine

## 2021-06-10 ENCOUNTER — Inpatient Hospital Stay: Payer: Medicare HMO

## 2021-06-10 NOTE — Telephone Encounter (Signed)
R/s appts per 8/5 sch msg. Pt aware.

## 2021-06-13 NOTE — Progress Notes (Signed)
Pharmacist Chemotherapy Monitoring - Initial Assessment    Anticipated start date: 06/20/21   The following has been reviewed per standard work regarding the patient's treatment regimen: The patient's diagnosis, treatment plan and drug doses, and organ/hematologic function Lab orders and baseline tests specific to treatment regimen  The treatment plan start date, drug sequencing, and pre-medications Prior authorization status  Patient's documented medication list, including drug-drug interaction screen and prescriptions for anti-emetics and supportive care specific to the treatment regimen The drug concentrations, fluid compatibility, administration routes, and timing of the medications to be used The patient's access for treatment and lifetime cumulative dose history, if applicable  The patient's medication allergies and previous infusion related reactions, if applicable   Changes made to treatment plan:  N/A  Follow up needed:  Pending authorization for treatment    Larene Beach, [place], 06/13/2021  4:20 PM

## 2021-06-15 ENCOUNTER — Other Ambulatory Visit: Payer: Self-pay

## 2021-06-15 ENCOUNTER — Encounter: Payer: Self-pay | Admitting: Internal Medicine

## 2021-06-15 ENCOUNTER — Ambulatory Visit
Admission: RE | Admit: 2021-06-15 | Discharge: 2021-06-15 | Disposition: A | Discharge status: Home / Self Care | Payer: Medicare HMO | Source: Ambulatory Visit | Attending: Radiation Oncology | Admitting: Radiation Oncology

## 2021-06-15 DIAGNOSIS — C3490 Malignant neoplasm of unspecified part of unspecified bronchus or lung: Secondary | ICD-10-CM | POA: Diagnosis not present

## 2021-06-15 DIAGNOSIS — Z510 Encounter for antineoplastic radiation therapy: Secondary | ICD-10-CM | POA: Diagnosis not present

## 2021-06-15 DIAGNOSIS — C7931 Secondary malignant neoplasm of brain: Secondary | ICD-10-CM | POA: Diagnosis not present

## 2021-06-15 DIAGNOSIS — C3412 Malignant neoplasm of upper lobe, left bronchus or lung: Secondary | ICD-10-CM

## 2021-06-15 NOTE — Progress Notes (Signed)
Called pt introduce myself as her Arboriculturist.  Unfortunately there aren't any foundations offering copay assistance for her Dx and the type of ins she has.  I informed her of the [employer] and went over what it covers, pt declined the grant at this time.

## 2021-06-16 ENCOUNTER — Ambulatory Visit
Admission: RE | Admit: 2021-06-16 | Discharge: 2021-06-16 | Disposition: A | Discharge status: Home / Self Care | Payer: Medicare HMO | Source: Ambulatory Visit | Attending: Radiation Oncology | Admitting: Radiation Oncology

## 2021-06-16 ENCOUNTER — Inpatient Hospital Stay: Payer: Medicare HMO

## 2021-06-16 DIAGNOSIS — Z510 Encounter for antineoplastic radiation therapy: Secondary | ICD-10-CM | POA: Diagnosis not present

## 2021-06-16 DIAGNOSIS — C3412 Malignant neoplasm of upper lobe, left bronchus or lung: Secondary | ICD-10-CM | POA: Diagnosis not present

## 2021-06-16 DIAGNOSIS — C3490 Malignant neoplasm of unspecified part of unspecified bronchus or lung: Secondary | ICD-10-CM | POA: Diagnosis not present

## 2021-06-16 DIAGNOSIS — C7931 Secondary malignant neoplasm of brain: Secondary | ICD-10-CM | POA: Diagnosis not present

## 2021-06-16 NOTE — Progress Notes (Signed)
Pt here for patient teaching.  Pt given Radiation and You booklet, skin care instructions, and Sonafine.  Reviewed areas of pertinence such as fatigue, hair loss, skin changes, throat changes, cough, and shortness of breath . Pt able to give teach back of to pat skin, use unscented/gentle soap, and drink plenty of water,apply Sonafine bid and avoid applying anything to skin within 4 hours of treatment. Pt verbalizes understanding of information given and will contact nursing with any questions or concerns.     [email or web address]

## 2021-06-17 ENCOUNTER — Ambulatory Visit
Admission: RE | Admit: 2021-06-17 | Discharge: 2021-06-17 | Disposition: A | Discharge status: Home / Self Care | Payer: Medicare HMO | Source: Ambulatory Visit | Attending: Radiation Oncology | Admitting: Radiation Oncology

## 2021-06-17 DIAGNOSIS — C7931 Secondary malignant neoplasm of brain: Secondary | ICD-10-CM | POA: Diagnosis not present

## 2021-06-17 DIAGNOSIS — Z510 Encounter for antineoplastic radiation therapy: Secondary | ICD-10-CM | POA: Diagnosis not present

## 2021-06-17 DIAGNOSIS — C3490 Malignant neoplasm of unspecified part of unspecified bronchus or lung: Secondary | ICD-10-CM | POA: Diagnosis not present

## 2021-06-17 DIAGNOSIS — C3412 Malignant neoplasm of upper lobe, left bronchus or lung: Secondary | ICD-10-CM | POA: Diagnosis not present

## 2021-06-20 ENCOUNTER — Inpatient Hospital Stay: Payer: Medicare HMO

## 2021-06-20 ENCOUNTER — Other Ambulatory Visit: Payer: Self-pay

## 2021-06-20 ENCOUNTER — Other Ambulatory Visit: Payer: Self-pay | Admitting: Medical Oncology

## 2021-06-20 ENCOUNTER — Ambulatory Visit
Admission: RE | Admit: 2021-06-20 | Discharge: 2021-06-20 | Disposition: A | Discharge status: Home / Self Care | Payer: Medicare HMO | Source: Ambulatory Visit | Attending: Radiation Oncology | Admitting: Radiation Oncology

## 2021-06-20 ENCOUNTER — Ambulatory Visit: Payer: Medicare HMO | Admitting: Adult Health

## 2021-06-20 VITALS — BP 129/68 | HR 56 | Temp 98.7°F | Resp 16 | Wt 131.5 lb

## 2021-06-20 DIAGNOSIS — C3490 Malignant neoplasm of unspecified part of unspecified bronchus or lung: Secondary | ICD-10-CM | POA: Diagnosis not present

## 2021-06-20 DIAGNOSIS — E785 Hyperlipidemia, unspecified: Secondary | ICD-10-CM | POA: Diagnosis not present

## 2021-06-20 DIAGNOSIS — C3412 Malignant neoplasm of upper lobe, left bronchus or lung: Secondary | ICD-10-CM | POA: Diagnosis not present

## 2021-06-20 DIAGNOSIS — Z510 Encounter for antineoplastic radiation therapy: Secondary | ICD-10-CM | POA: Diagnosis not present

## 2021-06-20 DIAGNOSIS — R69 Illness, unspecified: Secondary | ICD-10-CM | POA: Diagnosis not present

## 2021-06-20 DIAGNOSIS — E039 Hypothyroidism, unspecified: Secondary | ICD-10-CM | POA: Diagnosis not present

## 2021-06-20 DIAGNOSIS — C7951 Secondary malignant neoplasm of bone: Secondary | ICD-10-CM | POA: Diagnosis not present

## 2021-06-20 DIAGNOSIS — C3492 Malignant neoplasm of unspecified part of left bronchus or lung: Secondary | ICD-10-CM

## 2021-06-20 DIAGNOSIS — J449 Chronic obstructive pulmonary disease, unspecified: Secondary | ICD-10-CM | POA: Diagnosis not present

## 2021-06-20 DIAGNOSIS — Z5111 Encounter for antineoplastic chemotherapy: Secondary | ICD-10-CM | POA: Diagnosis not present

## 2021-06-20 DIAGNOSIS — C7931 Secondary malignant neoplasm of brain: Secondary | ICD-10-CM | POA: Diagnosis not present

## 2021-06-20 DIAGNOSIS — R630 Anorexia: Secondary | ICD-10-CM | POA: Diagnosis not present

## 2021-06-20 DIAGNOSIS — R590 Localized enlarged lymph nodes: Secondary | ICD-10-CM | POA: Diagnosis not present

## 2021-06-20 LAB — CBC WITH DIFFERENTIAL (CANCER CENTER ONLY)
Abs Immature Granulocytes: 0.01 10*3/uL (ref 0.00–0.07)
Basophils Absolute: 0.1 10*3/uL (ref 0.0–0.1)
Basophils Relative: 1 %
Eosinophils Absolute: 0.2 10*3/uL (ref 0.0–0.5)
Eosinophils Relative: 2 %
HCT: 37.8 % (ref 36.0–46.0)
Hemoglobin: 12.8 g/dL (ref 12.0–15.0)
Immature Granulocytes: 0 %
Lymphocytes Relative: 21 %
Lymphs Abs: 1.8 10*3/uL (ref 0.7–4.0)
MCH: 31.7 pg (ref 26.0–34.0)
MCHC: 33.9 g/dL (ref 30.0–36.0)
MCV: 93.6 fL (ref 80.0–100.0)
Monocytes Absolute: 0.6 10*3/uL (ref 0.1–1.0)
Monocytes Relative: 7 %
Neutro Abs: 6.1 10*3/uL (ref 1.7–7.7)
Neutrophils Relative %: 69 %
Platelet Count: 300 10*3/uL (ref 150–400)
RBC: 4.04 MIL/uL (ref 3.87–5.11)
RDW: 13.1 % (ref 11.5–15.5)
WBC Count: 8.8 10*3/uL (ref 4.0–10.5)
nRBC: 0 % (ref 0.0–0.2)

## 2021-06-20 LAB — CMP (CANCER CENTER ONLY)
ALT: 12 U/L (ref 0–44)
AST: 14 U/L — ABNORMAL LOW (ref 15–41)
Albumin: 3.4 g/dL — ABNORMAL LOW (ref 3.5–5.0)
Alkaline Phosphatase: 74 U/L (ref 38–126)
Anion gap: 10 (ref 5–15)
BUN: 15 mg/dL (ref 8–23)
CO2: 25 mmol/L (ref 22–32)
Calcium: 9.2 mg/dL (ref 8.9–10.3)
Chloride: 106 mmol/L (ref 98–111)
Creatinine: 0.98 mg/dL (ref 0.44–1.00)
GFR, Estimated: >60 mL/min (ref >60)
Glucose, Bld: 112 mg/dL — ABNORMAL HIGH (ref 70–99)
Potassium: 3.8 mmol/L (ref 3.5–5.1)
Sodium: 141 mmol/L (ref 135–145)
Total Bilirubin: 0.4 mg/dL (ref 0.3–1.2)
Total Protein: 6.7 g/dL (ref 6.5–8.1)

## 2021-06-20 MED ORDER — SODIUM CHLORIDE 0.9 % IV SOLN
45.0000 mg/m2 | Freq: Once | INTRAVENOUS | Status: AC
  Administered 2021-06-20: 78 mg via INTRAVENOUS
  Filled 2021-06-20: qty 13

## 2021-06-20 MED ORDER — SODIUM CHLORIDE 0.9 % IV SOLN
136.6000 mg | Freq: Once | INTRAVENOUS | Status: AC
  Administered 2021-06-20: 140 mg via INTRAVENOUS
  Filled 2021-06-20: qty 14

## 2021-06-20 MED ORDER — SODIUM CHLORIDE 0.9 % IV SOLN
10.0000 mg | Freq: Once | INTRAVENOUS | Status: AC
  Administered 2021-06-20: 10 mg via INTRAVENOUS
  Filled 2021-06-20: qty 10

## 2021-06-20 MED ORDER — SODIUM CHLORIDE 0.9 % IV SOLN: Freq: Once | INTRAVENOUS | Status: AC

## 2021-06-20 MED ORDER — FAMOTIDINE 20 MG IN NS 100 ML IVPB
20.0000 mg | Freq: Once | INTRAVENOUS | Status: AC
  Administered 2021-06-20: 20 mg via INTRAVENOUS
  Filled 2021-06-20: qty 100

## 2021-06-20 MED ORDER — PALONOSETRON HCL INJECTION 0.25 MG/5ML
0.2500 mg | Freq: Once | INTRAVENOUS | Status: AC
  Administered 2021-06-20: 0.25 mg via INTRAVENOUS
  Filled 2021-06-20: qty 5

## 2021-06-20 MED ORDER — DIPHENHYDRAMINE HCL 50 MG/ML IJ SOLN
50.0000 mg | Freq: Once | INTRAMUSCULAR | Status: AC
  Administered 2021-06-20: 50 mg via INTRAVENOUS
  Filled 2021-06-20: qty 1

## 2021-06-20 NOTE — Patient Instructions (Signed)
[facility]  Discharge Instructions: Thank you for choosing [facility] to provide your oncology and hematology care.   If you have a lab appointment with the [facility], please go directly to the [facility] and check in at the registration area.   Wear comfortable clothing and clothing appropriate for easy access to any Portacath or PICC line.   We strive to give you quality time with your provider. You may need to reschedule your appointment if you arrive late (15 or more minutes).  Arriving late affects you and other patients whose appointments are after yours.  Also, if you miss three or more appointments without notifying the office, you may be dismissed from the clinic at the provider's discretion.      For prescription refill requests, have your pharmacy contact our office and allow 72 hours for refills to be completed.    Today you received the following chemotherapy and/or immunotherapy agents: Paclitaxel/ Carboplatin.      To help prevent nausea and vomiting after your treatment, we encourage you to take your nausea medication as directed.  BELOW ARE SYMPTOMS THAT SHOULD BE REPORTED IMMEDIATELY: *FEVER GREATER THAN 100.4 F (38 C) OR HIGHER *CHILLS OR SWEATING *NAUSEA AND VOMITING THAT IS NOT CONTROLLED WITH YOUR NAUSEA MEDICATION *UNUSUAL SHORTNESS OF BREATH *UNUSUAL BRUISING OR BLEEDING *URINARY PROBLEMS (pain or burning when urinating, or frequent urination) *BOWEL PROBLEMS (unusual diarrhea, constipation, pain near the anus) TENDERNESS IN MOUTH AND THROAT WITH OR WITHOUT PRESENCE OF ULCERS (sore throat, sores in mouth, or a toothache) UNUSUAL RASH, SWELLING OR PAIN  UNUSUAL VAGINAL DISCHARGE OR ITCHING   Items with * indicate a potential emergency and should be followed up as soon as possible or go to the Emergency Department if any problems should occur.  Please show the CHEMOTHERAPY ALERT CARD or IMMUNOTHERAPY ALERT CARD  at check-in to the Emergency Department and triage nurse.  Should you have questions after your visit or need to cancel or reschedule your appointment, please contact [facility]  Dept: [phone]  and follow the prompts.  Office hours are 8:00 a.m. to 4:30 p.m. Monday - Friday. Please note that voicemails left after 4:00 p.m. may not be returned until the following business day.  We are closed weekends and major holidays. You have access to a nurse at all times for urgent questions. Please call the main number to the clinic Dept: [phone] and follow the prompts.   For any non-urgent questions, you may also contact your provider using MyChart. We now offer e-Visits for anyone 75 and older to request care online for non-urgent symptoms. For details visit [email or web address].   Also download the MyChart app! Go to the app store, search "MyChart", open the app, select [facility], and log in with your MyChart username and password.  Due to Covid, a mask is required upon entering the hospital/clinic. If you do not have a mask, one will be given to you upon arrival. For doctor visits, patients may have 1 support person aged 42 or older with them. For treatment visits, patients cannot have anyone with them due to current Covid guidelines and our immunocompromised population.  Paclitaxel injection What is this medication? PACLITAXEL (PAK li TAX el) is a chemotherapy drug. It targets fast dividing cells, like cancer cells, and causes these cells to die. This medicine is used to treat ovarian cancer, breast cancer, lung cancer, Kaposi's sarcoma, andother cancers. This medicine may be  used for other purposes; ask your health care provider orpharmacist if you have questions. COMMON BRAND NAME(S): Onxol, Taxol What should I tell my care team before I take this medication? They need to know if you have any of these conditions: history of irregular heartbeat liver  disease low blood counts, like low white cell, platelet, or red cell counts lung or breathing disease, like asthma tingling of the fingers or toes, or other nerve disorder an unusual or allergic reaction to paclitaxel, alcohol, polyoxyethylated castor oil, other chemotherapy, other medicines, foods, dyes, or preservatives pregnant or trying to get pregnant breast-feeding How should I use this medication? This drug is given as an infusion into a vein. It is administered in a hospitalor clinic by a specially trained health care professional. Talk to your pediatrician regarding the use of this medicine in children.Special care may be needed. Overdosage: If you think you have taken too much of this medicine contact apoison control center or emergency room at once. NOTE: This medicine is only for you. Do not share this medicine with others. What if I miss a dose? It is important not to miss your dose. Call your doctor or health careprofessional if you are unable to keep an appointment. What may interact with this medication? Do not take this medicine with any of the following medications: live virus vaccines This medicine may also interact with the following medications: antiviral medicines for hepatitis, HIV or AIDS certain antibiotics like erythromycin and clarithromycin certain medicines for fungal infections like ketoconazole and itraconazole certain medicines for seizures like carbamazepine, phenobarbital, phenytoin gemfibrozil nefazodone rifampin St. John's wort This list may not describe all possible interactions. Give your health care provider a list of all the medicines, herbs, non-prescription drugs, or dietary supplements you use. Also tell them if you smoke, drink alcohol, or use illegaldrugs. Some items may interact with your medicine. What should I watch for while using this medication? Your condition will be monitored carefully while you are receiving this medicine. You will  need important blood work done while you are taking thismedicine. This medicine can cause serious allergic reactions. To reduce your risk you will need to take other medicine(s) before treatment with this medicine. If you experience allergic reactions like skin rash, itching or hives, swelling of theface, lips, or tongue, tell your doctor or health care professional right away. In some cases, you may be given additional medicines to help with side effects.Follow all directions for their use. This drug may make you feel generally unwell. This is not uncommon, as chemotherapy can affect healthy cells as well as cancer cells. Report any side effects. Continue your course of treatment even though you feel ill unless yourdoctor tells you to stop. Call your doctor or health care professional for advice if you get a fever, chills or sore throat, or other symptoms of a cold or flu. Do not treat yourself. This drug decreases your body's ability to fight infections. Try toavoid being around people who are sick. This medicine may increase your risk to bruise or bleed. Call your doctor orhealth care professional if you notice any unusual bleeding. Be careful brushing and flossing your teeth or using a toothpick because you may get an infection or bleed more easily. If you have any dental work done,tell your dentist you are receiving this medicine. Avoid taking products that contain aspirin, acetaminophen, ibuprofen, naproxen, or ketoprofen unless instructed by your doctor. These medicines may hide afever. Do not become pregnant while taking this medicine.  Women should inform their doctor if they wish to become pregnant or think they might be pregnant. There is a potential for serious side effects to an unborn child. Talk to your health care professional or pharmacist for more information. Do not breast-feed aninfant while taking this medicine. Men are advised not to father a child while receiving this medicine. This  product may contain alcohol. Ask your pharmacist or healthcare provider if this medicine contains alcohol. Be sure to tell all healthcare providers you are taking this medicine. Certain medicines, like metronidazole and disulfiram, can cause an unpleasant reaction when taken with alcohol. The reaction includes flushing, headache, nausea, vomiting, sweating, and increased thirst. Thereaction can last from 30 minutes to several hours. What side effects may I notice from receiving this medication? Side effects that you should report to your doctor or health care professionalas soon as possible: allergic reactions like skin rash, itching or hives, swelling of the face, lips, or tongue breathing problems changes in vision fast, irregular heartbeat high or low blood pressure mouth sores pain, tingling, numbness in the hands or feet signs of decreased platelets or bleeding - bruising, pinpoint red spots on the skin, black, tarry stools, blood in the urine signs of decreased red blood cells - unusually weak or tired, feeling faint or lightheaded, falls signs of infection - fever or chills, cough, sore throat, pain or difficulty passing urine signs and symptoms of liver injury like dark yellow or brown urine; general ill feeling or flu-like symptoms; light-colored stools; loss of appetite; nausea; right upper belly pain; unusually weak or tired; yellowing of the eyes or skin swelling of the ankles, feet, hands unusually slow heartbeat Side effects that usually do not require medical attention (report to yourdoctor or health care professional if they continue or are bothersome): diarrhea hair loss loss of appetite muscle or joint pain nausea, vomiting pain, redness, or irritation at site where injected tiredness This list may not describe all possible side effects. Call your doctor for medical advice about side effects. You may report side effects to FDA at1-800-FDA-1088. Where should I keep my  medication? This drug is given in a hospital or clinic and will not be stored at home. NOTE: This sheet is a summary. It may not cover all possible information. If you have questions about this medicine, talk to your doctor, pharmacist, orhealth care provider.  2022 Elsevier/Gold Standard (2019-09-24 13:37:23)   Carboplatin injection What is this medication? CARBOPLATIN (KAR boe pla tin) is a chemotherapy drug. It targets fast dividing cells, like cancer cells, and causes these cells to die. This medicine is usedto treat ovarian cancer and many other cancers. This medicine may be used for other purposes; ask your health care provider orpharmacist if you have questions. COMMON BRAND NAME(S): Paraplatin What should I tell my care team before I take this medication? They need to know if you have any of these conditions: blood disorders hearing problems kidney disease recent or ongoing radiation therapy an unusual or allergic reaction to carboplatin, cisplatin, other chemotherapy, other medicines, foods, dyes, or preservatives pregnant or trying to get pregnant breast-feeding How should I use this medication? This drug is usually given as an infusion into a vein. It is administered in Between or clinic by a specially trained health care professional. Talk to your pediatrician regarding the use of this medicine in children.Special care may be needed. Overdosage: If you think you have taken too much of this medicine contact apoison control center or emergency room  at once. NOTE: This medicine is only for you. Do not share this medicine with others. What if I miss a dose? It is important not to miss a dose. Call your doctor or health careprofessional if you are unable to keep an appointment. What may interact with this medication? medicines for seizures medicines to increase blood counts like filgrastim, pegfilgrastim, sargramostim some antibiotics like amikacin, gentamicin, neomycin,  streptomycin, tobramycin vaccines Talk to your doctor or health care professional before taking any of thesemedicines: acetaminophen aspirin ibuprofen ketoprofen naproxen This list may not describe all possible interactions. Give your health care provider a list of all the medicines, herbs, non-prescription drugs, or dietary supplements you use. Also tell them if you smoke, drink alcohol, or use illegaldrugs. Some items may interact with your medicine. What should I watch for while using this medication? Your condition will be monitored carefully while you are receiving this medicine. You will need important blood work done while you are taking thismedicine. This drug may make you feel generally unwell. This is not uncommon, as chemotherapy can affect healthy cells as well as cancer cells. Report any side effects. Continue your course of treatment even though you feel ill unless yourdoctor tells you to stop. In some cases, you may be given additional medicines to help with side effects.Follow all directions for their use. Call your doctor or health care professional for advice if you get a fever, chills or sore throat, or other symptoms of a cold or flu. Do not treat yourself. This drug decreases your body's ability to fight infections. Try toavoid being around people who are sick. This medicine may increase your risk to bruise or bleed. Call your doctor orhealth care professional if you notice any unusual bleeding. Be careful brushing and flossing your teeth or using a toothpick because you may get an infection or bleed more easily. If you have any dental work done,tell your dentist you are receiving this medicine. Avoid taking products that contain aspirin, acetaminophen, ibuprofen, naproxen, or ketoprofen unless instructed by your doctor. These medicines may hide afever. Do not become pregnant while taking this medicine. Women should inform their doctor if they wish to become pregnant or think  they might be pregnant. There is a potential for serious side effects to an unborn child. Talk to your health care professional or pharmacist for more information. Do not breast-feed aninfant while taking this medicine. What side effects may I notice from receiving this medication? Side effects that you should report to your doctor or health care professionalas soon as possible: allergic reactions like skin rash, itching or hives, swelling of the face, lips, or tongue signs of infection - fever or chills, cough, sore throat, pain or difficulty passing urine signs of decreased platelets or bleeding - bruising, pinpoint red spots on the skin, black, tarry stools, nosebleeds signs of decreased red blood cells - unusually weak or tired, fainting spells, lightheadedness breathing problems changes in hearing changes in vision chest pain high blood pressure low blood counts - This drug may decrease the number of white blood cells, red blood cells and platelets. You may be at increased risk for infections and bleeding. nausea and vomiting pain, swelling, redness or irritation at the injection site pain, tingling, numbness in the hands or feet problems with balance, talking, walking trouble passing urine or change in the amount of urine Side effects that usually do not require medical attention (report to yourdoctor or health care professional if they continue or are bothersome): hair  loss loss of appetite metallic taste in the mouth or changes in taste This list may not describe all possible side effects. Call your doctor for medical advice about side effects. You may report side effects to FDA at1-800-FDA-1088. Where should I keep my medication? This drug is given in a hospital or clinic and will not be stored at home. NOTE: This sheet is a summary. It may not cover all possible information. If you have questions about this medicine, talk to your doctor, pharmacist, orhealth care provider.  2022  Elsevier/Gold Standard (2008-01-28 14:38:05)

## 2021-06-21 ENCOUNTER — Other Ambulatory Visit: Payer: Self-pay | Admitting: Radiation Therapy

## 2021-06-21 ENCOUNTER — Ambulatory Visit: Payer: Medicare HMO | Admitting: Radiation Oncology

## 2021-06-21 ENCOUNTER — Ambulatory Visit
Admission: RE | Admit: 2021-06-21 | Discharge: 2021-06-21 | Disposition: A | Discharge status: Home / Self Care | Payer: Medicare HMO | Source: Ambulatory Visit | Attending: Radiation Oncology | Admitting: Radiation Oncology

## 2021-06-21 ENCOUNTER — Ambulatory Visit: Payer: Medicare HMO | Admitting: Adult Health

## 2021-06-21 DIAGNOSIS — C7931 Secondary malignant neoplasm of brain: Secondary | ICD-10-CM | POA: Diagnosis not present

## 2021-06-21 DIAGNOSIS — C3412 Malignant neoplasm of upper lobe, left bronchus or lung: Secondary | ICD-10-CM | POA: Diagnosis not present

## 2021-06-21 DIAGNOSIS — Z510 Encounter for antineoplastic radiation therapy: Secondary | ICD-10-CM | POA: Diagnosis not present

## 2021-06-21 DIAGNOSIS — C3490 Malignant neoplasm of unspecified part of unspecified bronchus or lung: Secondary | ICD-10-CM | POA: Diagnosis not present

## 2021-06-22 ENCOUNTER — Ambulatory Visit
Admission: RE | Admit: 2021-06-22 | Discharge: 2021-06-22 | Disposition: A | Discharge status: Home / Self Care | Payer: Medicare HMO | Source: Ambulatory Visit | Attending: Radiation Oncology | Admitting: Radiation Oncology

## 2021-06-22 ENCOUNTER — Telehealth: Payer: Self-pay

## 2021-06-22 ENCOUNTER — Telehealth: Payer: Self-pay | Admitting: *Deleted

## 2021-06-22 ENCOUNTER — Other Ambulatory Visit: Payer: Self-pay

## 2021-06-22 DIAGNOSIS — C7931 Secondary malignant neoplasm of brain: Secondary | ICD-10-CM | POA: Diagnosis not present

## 2021-06-22 DIAGNOSIS — C3490 Malignant neoplasm of unspecified part of unspecified bronchus or lung: Secondary | ICD-10-CM | POA: Diagnosis not present

## 2021-06-22 DIAGNOSIS — Z510 Encounter for antineoplastic radiation therapy: Secondary | ICD-10-CM | POA: Diagnosis not present

## 2021-06-22 DIAGNOSIS — C3412 Malignant neoplasm of upper lobe, left bronchus or lung: Secondary | ICD-10-CM | POA: Diagnosis not present

## 2021-06-22 NOTE — Telephone Encounter (Signed)
Pt LM wanting to know fi she can take a stool softener.  I have called the pt and her daughter [name] answered. I have advised pts daughter, yes, she can take a stool softener. [name] expressed understanding of this information.

## 2021-06-23 ENCOUNTER — Ambulatory Visit
Admission: RE | Admit: 2021-06-23 | Discharge: 2021-06-23 | Disposition: A | Discharge status: Home / Self Care | Payer: Medicare HMO | Source: Ambulatory Visit | Attending: Radiation Oncology | Admitting: Radiation Oncology

## 2021-06-23 VITALS — BP 125/67 | HR 59 | Temp 97.9°F | Resp 18

## 2021-06-23 DIAGNOSIS — C3412 Malignant neoplasm of upper lobe, left bronchus or lung: Secondary | ICD-10-CM | POA: Diagnosis not present

## 2021-06-23 DIAGNOSIS — C7931 Secondary malignant neoplasm of brain: Secondary | ICD-10-CM | POA: Diagnosis not present

## 2021-06-23 DIAGNOSIS — Z510 Encounter for antineoplastic radiation therapy: Secondary | ICD-10-CM | POA: Diagnosis not present

## 2021-06-23 DIAGNOSIS — C3490 Malignant neoplasm of unspecified part of unspecified bronchus or lung: Secondary | ICD-10-CM | POA: Diagnosis not present

## 2021-06-23 NOTE — Progress Notes (Signed)
Nurse monitoring complete status post 1 of 1 SRS treatments. Patient without complaints. Patient denies new or worsening neurologic symptoms. Vitals stable. Instructed patient to avoid strenuous activity for the next 24 hours. Instructed patient to call [phone] with needs related to treatment after hours or over the weekend. Patient verbalized understanding and awareness of future radiation/medical oncology appointments. Ambulated out of clinic unassisted to family member's vehicle without incident  Vitals:   06/23/21 1550  BP: 125/67  Pulse: (!) 59  Resp: 18  Temp: 97.9 F (36.6 C)  SpO2: 99%

## 2021-06-24 ENCOUNTER — Encounter (HOSPITAL_COMMUNITY): Payer: Self-pay

## 2021-06-24 ENCOUNTER — Other Ambulatory Visit: Payer: Self-pay

## 2021-06-24 ENCOUNTER — Ambulatory Visit
Admission: RE | Admit: 2021-06-24 | Discharge: 2021-06-24 | Disposition: A | Discharge status: Home / Self Care | Payer: Medicare HMO | Source: Ambulatory Visit | Attending: Radiation Oncology | Admitting: Radiation Oncology

## 2021-06-24 DIAGNOSIS — C3490 Malignant neoplasm of unspecified part of unspecified bronchus or lung: Secondary | ICD-10-CM | POA: Diagnosis not present

## 2021-06-24 DIAGNOSIS — C3412 Malignant neoplasm of upper lobe, left bronchus or lung: Secondary | ICD-10-CM | POA: Diagnosis not present

## 2021-06-24 DIAGNOSIS — Z510 Encounter for antineoplastic radiation therapy: Secondary | ICD-10-CM | POA: Diagnosis not present

## 2021-06-24 DIAGNOSIS — C7931 Secondary malignant neoplasm of brain: Secondary | ICD-10-CM | POA: Diagnosis not present

## 2021-06-25 NOTE — Progress Notes (Signed)
  Radiation Oncology         (336) [phone] ________________________________  Stereotactic Treatment Procedure Note  Name: Jill [name] MRN: [id number]  Date: 06/23/2021  DOB: 1951-04-18  SPECIAL TREATMENT PROCEDURE    ICD-10-CM   1. Primary adenocarcinoma of left upper lobe of lung ([place])  C34.12       3D TREATMENT PLANNING AND DOSIMETRY:  The patient's radiation plan was reviewed and approved by neurosurgery and radiation oncology prior to treatment.  It showed 3-dimensional radiation distributions overlaid onto the planning CT/MRI image set.  The [facility] for the target structures as well as the organs at risk were reviewed. The documentation of the 3D plan and dosimetry are filed in the radiation oncology EMR.  NARRATIVE:  Jill [name] was brought to the TrueBeam stereotactic radiation treatment machine and placed supine on the CT couch. The head frame was applied, and the patient was set up for stereotactic radiosurgery.  Neurosurgery was present for the set-up and delivery  SIMULATION VERIFICATION:  In the couch zero-angle position, the patient underwent Exactrac imaging using the Brainlab system with orthogonal KV images.  These were carefully aligned and repeated to confirm treatment position for each of the isocenters.  The Exactrac snap film verification was repeated at each couch angle.  PROCEDURE: Jill [name] received stereotactic radiosurgery to the following targets: Left Frontal 7 mm target was treated using 3 Dynamic Conformal Arcs to a prescription dose of 20 Gy.  ExacTrac registration was performed for each couch angle.  The 100% isodose line was prescribed.  6 MV X-rays were delivered in the flattening filter free beam mode.  STEREOTACTIC TREATMENT MANAGEMENT:  Following delivery, the patient was transported to nursing in stable condition and monitored for possible acute effects.  Vital signs were recorded BP 125/67 (BP Location: Right Arm, Patient Position: Sitting)    Pulse (!) 59   Temp 97.9 F (36.6 C) (Oral)   Resp 18   SpO2 99% . The patient tolerated treatment without significant acute effects, and was discharged to home in stable condition.    PLAN: Follow-up in one month.  ________________________________  Sheral Apley. Tammi Klippel, M.D.

## 2021-06-27 ENCOUNTER — Ambulatory Visit
Admission: RE | Admit: 2021-06-27 | Discharge: 2021-06-27 | Disposition: A | Discharge status: Home / Self Care | Payer: Medicare HMO | Source: Ambulatory Visit | Attending: Radiation Oncology | Admitting: Radiation Oncology

## 2021-06-27 ENCOUNTER — Inpatient Hospital Stay: Payer: Medicare HMO

## 2021-06-27 ENCOUNTER — Other Ambulatory Visit: Payer: Self-pay

## 2021-06-27 ENCOUNTER — Other Ambulatory Visit: Payer: Self-pay | Admitting: *Deleted

## 2021-06-27 VITALS — BP 121/66 | HR 58 | Temp 98.0°F | Resp 18 | Ht 67.0 in | Wt 134.5 lb

## 2021-06-27 DIAGNOSIS — R69 Illness, unspecified: Secondary | ICD-10-CM | POA: Diagnosis not present

## 2021-06-27 DIAGNOSIS — Z5111 Encounter for antineoplastic chemotherapy: Secondary | ICD-10-CM | POA: Diagnosis not present

## 2021-06-27 DIAGNOSIS — R590 Localized enlarged lymph nodes: Secondary | ICD-10-CM | POA: Diagnosis not present

## 2021-06-27 DIAGNOSIS — E785 Hyperlipidemia, unspecified: Secondary | ICD-10-CM | POA: Diagnosis not present

## 2021-06-27 DIAGNOSIS — C7951 Secondary malignant neoplasm of bone: Secondary | ICD-10-CM | POA: Diagnosis not present

## 2021-06-27 DIAGNOSIS — J449 Chronic obstructive pulmonary disease, unspecified: Secondary | ICD-10-CM | POA: Diagnosis not present

## 2021-06-27 DIAGNOSIS — Z510 Encounter for antineoplastic radiation therapy: Secondary | ICD-10-CM | POA: Diagnosis not present

## 2021-06-27 DIAGNOSIS — C3412 Malignant neoplasm of upper lobe, left bronchus or lung: Secondary | ICD-10-CM | POA: Diagnosis not present

## 2021-06-27 DIAGNOSIS — R630 Anorexia: Secondary | ICD-10-CM | POA: Diagnosis not present

## 2021-06-27 DIAGNOSIS — C7931 Secondary malignant neoplasm of brain: Secondary | ICD-10-CM | POA: Diagnosis not present

## 2021-06-27 DIAGNOSIS — C3490 Malignant neoplasm of unspecified part of unspecified bronchus or lung: Secondary | ICD-10-CM | POA: Diagnosis not present

## 2021-06-27 DIAGNOSIS — E039 Hypothyroidism, unspecified: Secondary | ICD-10-CM | POA: Diagnosis not present

## 2021-06-27 LAB — COMPREHENSIVE METABOLIC PANEL
ALT: 15 U/L (ref 0–44)
AST: 16 U/L (ref 15–41)
Albumin: 3.4 g/dL — ABNORMAL LOW (ref 3.5–5.0)
Alkaline Phosphatase: 57 U/L (ref 38–126)
Anion gap: 8 (ref 5–15)
BUN: 22 mg/dL (ref 8–23)
CO2: 26 mmol/L (ref 22–32)
Calcium: 9.3 mg/dL (ref 8.9–10.3)
Chloride: 106 mmol/L (ref 98–111)
Creatinine, Ser: 0.86 mg/dL (ref 0.44–1.00)
GFR, Estimated: >60 mL/min (ref >60)
Glucose, Bld: 108 mg/dL — ABNORMAL HIGH (ref 70–99)
Potassium: 4.3 mmol/L (ref 3.5–5.1)
Sodium: 140 mmol/L (ref 135–145)
Total Bilirubin: <0.2 mg/dL — ABNORMAL LOW (ref 0.3–1.2)
Total Protein: 6.4 g/dL — ABNORMAL LOW (ref 6.5–8.1)

## 2021-06-27 LAB — CBC WITH DIFFERENTIAL/PLATELET
Abs Immature Granulocytes: 0.05 10*3/uL (ref 0.00–0.07)
Basophils Absolute: 0.1 10*3/uL (ref 0.0–0.1)
Basophils Relative: 1 %
Eosinophils Absolute: 0.3 10*3/uL (ref 0.0–0.5)
Eosinophils Relative: 3 %
HCT: 35.8 % — ABNORMAL LOW (ref 36.0–46.0)
Hemoglobin: 11.9 g/dL — ABNORMAL LOW (ref 12.0–15.0)
Immature Granulocytes: 1 %
Lymphocytes Relative: 13 %
Lymphs Abs: 1.4 10*3/uL (ref 0.7–4.0)
MCH: 31.7 pg (ref 26.0–34.0)
MCHC: 33.2 g/dL (ref 30.0–36.0)
MCV: 95.5 fL (ref 80.0–100.0)
Monocytes Absolute: 0.6 10*3/uL (ref 0.1–1.0)
Monocytes Relative: 5 %
Neutro Abs: 8.5 10*3/uL — ABNORMAL HIGH (ref 1.7–7.7)
Neutrophils Relative %: 77 %
Platelets: 274 10*3/uL (ref 150–400)
RBC: 3.75 MIL/uL — ABNORMAL LOW (ref 3.87–5.11)
RDW: 13 % (ref 11.5–15.5)
WBC: 11 10*3/uL — ABNORMAL HIGH (ref 4.0–10.5)
nRBC: 0 % (ref 0.0–0.2)

## 2021-06-27 MED ORDER — DIPHENHYDRAMINE HCL 50 MG/ML IJ SOLN
50.0000 mg | Freq: Once | INTRAMUSCULAR | Status: AC
  Administered 2021-06-27: 50 mg via INTRAVENOUS
  Filled 2021-06-27: qty 1

## 2021-06-27 MED ORDER — PALONOSETRON HCL INJECTION 0.25 MG/5ML
0.2500 mg | Freq: Once | INTRAVENOUS | Status: AC
  Administered 2021-06-27: 0.25 mg via INTRAVENOUS
  Filled 2021-06-27: qty 5

## 2021-06-27 MED ORDER — SODIUM CHLORIDE 0.9 % IV SOLN
45.0000 mg/m2 | Freq: Once | INTRAVENOUS | Status: AC
  Administered 2021-06-27: 78 mg via INTRAVENOUS
  Filled 2021-06-27: qty 13

## 2021-06-27 MED ORDER — SODIUM CHLORIDE 0.9 % IV SOLN
10.0000 mg | Freq: Once | INTRAVENOUS | Status: AC
  Administered 2021-06-27: 10 mg via INTRAVENOUS
  Filled 2021-06-27: qty 10

## 2021-06-27 MED ORDER — FAMOTIDINE 20 MG IN NS 100 ML IVPB
20.0000 mg | Freq: Once | INTRAVENOUS | Status: AC
  Administered 2021-06-27: 20 mg via INTRAVENOUS
  Filled 2021-06-27: qty 100

## 2021-06-27 MED ORDER — SODIUM CHLORIDE 0.9 % IV SOLN
136.6000 mg | Freq: Once | INTRAVENOUS | Status: AC
  Administered 2021-06-27: 140 mg via INTRAVENOUS
  Filled 2021-06-27: qty 14

## 2021-06-27 MED ORDER — SODIUM CHLORIDE 0.9 % IV SOLN: Freq: Once | INTRAVENOUS | Status: AC

## 2021-06-27 NOTE — Patient Instructions (Signed)
[facility] ONCOLOGY  Discharge Instructions: Thank you for choosing [facility] to provide your oncology and hematology care.   If you have a lab appointment with the [facility], please go directly to the [facility] and check in at the registration area.   Wear comfortable clothing and clothing appropriate for easy access to any Portacath or PICC line.   We strive to give you quality time with your provider. You may need to reschedule your appointment if you arrive late (15 or more minutes).  Arriving late affects you and other patients whose appointments are after yours.  Also, if you miss three or more appointments without notifying the office, you may be dismissed from the clinic at the provider's discretion.      For prescription refill requests, have your pharmacy contact our office and allow 72 hours for refills to be completed.    Today you received the following chemotherapy and/or immunotherapy agents Carboplatin and Taxol      To help prevent nausea and vomiting after your treatment, we encourage you to take your nausea medication as directed.  BELOW ARE SYMPTOMS THAT SHOULD BE REPORTED IMMEDIATELY: *FEVER GREATER THAN 100.4 F (38 C) OR HIGHER *CHILLS OR SWEATING *NAUSEA AND VOMITING THAT IS NOT CONTROLLED WITH YOUR NAUSEA MEDICATION *UNUSUAL SHORTNESS OF BREATH *UNUSUAL BRUISING OR BLEEDING *URINARY PROBLEMS (pain or burning when urinating, or frequent urination) *BOWEL PROBLEMS (unusual diarrhea, constipation, pain near the anus) TENDERNESS IN MOUTH AND THROAT WITH OR WITHOUT PRESENCE OF ULCERS (sore throat, sores in mouth, or a toothache) UNUSUAL RASH, SWELLING OR PAIN  UNUSUAL VAGINAL DISCHARGE OR ITCHING   Items with * indicate a potential emergency and should be followed up as soon as possible or go to the Emergency Department if any problems should occur.  Please show the CHEMOTHERAPY ALERT CARD or IMMUNOTHERAPY ALERT CARD at  check-in to the Emergency Department and triage nurse.  Should you have questions after your visit or need to cancel or reschedule your appointment, please contact [facility]  Dept: [phone]  and follow the prompts.  Office hours are 8:00 a.m. to 4:30 p.m. Monday - Friday. Please note that voicemails left after 4:00 p.m. may not be returned until the following business day.  We are closed weekends and major holidays. You have access to a nurse at all times for urgent questions. Please call the main number to the clinic Dept: [phone] and follow the prompts.   For any non-urgent questions, you may also contact your provider using MyChart. We now offer e-Visits for anyone 87 and older to request care online for non-urgent symptoms. For details visit [email or web address].   Also download the MyChart app! Go to the app store, search "MyChart", open the app, select [facility], and log in with your MyChart username and password.  Due to Covid, a mask is required upon entering the hospital/clinic. If you do not have a mask, one will be given to you upon arrival. For doctor visits, patients may have 1 support person aged 36 or older with them. For treatment visits, patients cannot have anyone with them due to current Covid guidelines and our immunocompromised population.

## 2021-06-28 ENCOUNTER — Ambulatory Visit
Admission: RE | Admit: 2021-06-28 | Discharge: 2021-06-28 | Disposition: A | Discharge status: Home / Self Care | Payer: Medicare HMO | Source: Ambulatory Visit | Attending: Radiation Oncology | Admitting: Radiation Oncology

## 2021-06-28 ENCOUNTER — Other Ambulatory Visit: Payer: Self-pay

## 2021-06-28 DIAGNOSIS — C3490 Malignant neoplasm of unspecified part of unspecified bronchus or lung: Secondary | ICD-10-CM | POA: Diagnosis not present

## 2021-06-28 DIAGNOSIS — Z510 Encounter for antineoplastic radiation therapy: Secondary | ICD-10-CM | POA: Diagnosis not present

## 2021-06-28 DIAGNOSIS — C7931 Secondary malignant neoplasm of brain: Secondary | ICD-10-CM | POA: Diagnosis not present

## 2021-06-28 DIAGNOSIS — C3412 Malignant neoplasm of upper lobe, left bronchus or lung: Secondary | ICD-10-CM | POA: Diagnosis not present

## 2021-06-29 ENCOUNTER — Ambulatory Visit
Admission: RE | Admit: 2021-06-29 | Discharge: 2021-06-29 | Disposition: A | Discharge status: Home / Self Care | Payer: Medicare HMO | Source: Ambulatory Visit | Attending: Radiation Oncology | Admitting: Radiation Oncology

## 2021-06-29 ENCOUNTER — Other Ambulatory Visit: Payer: Self-pay

## 2021-06-29 DIAGNOSIS — Z510 Encounter for antineoplastic radiation therapy: Secondary | ICD-10-CM | POA: Diagnosis not present

## 2021-06-29 DIAGNOSIS — C3412 Malignant neoplasm of upper lobe, left bronchus or lung: Secondary | ICD-10-CM | POA: Diagnosis not present

## 2021-06-29 DIAGNOSIS — C3490 Malignant neoplasm of unspecified part of unspecified bronchus or lung: Secondary | ICD-10-CM | POA: Diagnosis not present

## 2021-06-29 DIAGNOSIS — C7931 Secondary malignant neoplasm of brain: Secondary | ICD-10-CM | POA: Diagnosis not present

## 2021-06-30 ENCOUNTER — Other Ambulatory Visit: Payer: Self-pay | Admitting: Family Medicine

## 2021-06-30 ENCOUNTER — Ambulatory Visit
Admission: RE | Admit: 2021-06-30 | Discharge: 2021-06-30 | Disposition: A | Discharge status: Home / Self Care | Payer: Medicare HMO | Source: Ambulatory Visit | Attending: Radiation Oncology | Admitting: Radiation Oncology

## 2021-06-30 DIAGNOSIS — C3412 Malignant neoplasm of upper lobe, left bronchus or lung: Secondary | ICD-10-CM | POA: Diagnosis not present

## 2021-06-30 DIAGNOSIS — C3490 Malignant neoplasm of unspecified part of unspecified bronchus or lung: Secondary | ICD-10-CM | POA: Diagnosis not present

## 2021-06-30 DIAGNOSIS — C7931 Secondary malignant neoplasm of brain: Secondary | ICD-10-CM | POA: Diagnosis not present

## 2021-06-30 DIAGNOSIS — Z510 Encounter for antineoplastic radiation therapy: Secondary | ICD-10-CM | POA: Diagnosis not present

## 2021-06-30 LAB — GUARDANT 360

## 2021-07-01 ENCOUNTER — Other Ambulatory Visit: Payer: Self-pay

## 2021-07-01 ENCOUNTER — Ambulatory Visit
Admission: RE | Admit: 2021-07-01 | Discharge: 2021-07-01 | Disposition: A | Discharge status: Home / Self Care | Payer: Medicare HMO | Source: Ambulatory Visit | Attending: Radiation Oncology | Admitting: Radiation Oncology

## 2021-07-01 ENCOUNTER — Other Ambulatory Visit: Payer: Self-pay | Admitting: Internal Medicine

## 2021-07-01 DIAGNOSIS — C3412 Malignant neoplasm of upper lobe, left bronchus or lung: Secondary | ICD-10-CM

## 2021-07-01 DIAGNOSIS — Z510 Encounter for antineoplastic radiation therapy: Secondary | ICD-10-CM | POA: Diagnosis not present

## 2021-07-01 DIAGNOSIS — C3492 Malignant neoplasm of unspecified part of left bronchus or lung: Secondary | ICD-10-CM

## 2021-07-01 DIAGNOSIS — C3490 Malignant neoplasm of unspecified part of unspecified bronchus or lung: Secondary | ICD-10-CM | POA: Diagnosis not present

## 2021-07-01 DIAGNOSIS — C7931 Secondary malignant neoplasm of brain: Secondary | ICD-10-CM | POA: Diagnosis not present

## 2021-07-04 ENCOUNTER — Ambulatory Visit
Admission: RE | Admit: 2021-07-04 | Discharge: 2021-07-04 | Disposition: A | Discharge status: Home / Self Care | Payer: Medicare HMO | Source: Ambulatory Visit | Attending: Radiation Oncology | Admitting: Radiation Oncology

## 2021-07-04 ENCOUNTER — Inpatient Hospital Stay (HOSPITAL_BASED_OUTPATIENT_CLINIC_OR_DEPARTMENT_OTHER): Payer: Medicare HMO | Admitting: Internal Medicine

## 2021-07-04 ENCOUNTER — Encounter: Payer: Self-pay | Admitting: Internal Medicine

## 2021-07-04 ENCOUNTER — Inpatient Hospital Stay: Payer: Medicare HMO

## 2021-07-04 ENCOUNTER — Other Ambulatory Visit: Payer: Self-pay

## 2021-07-04 VITALS — BP 134/68 | HR 72 | Temp 97.4°F | Resp 20 | Ht 67.0 in | Wt 134.0 lb

## 2021-07-04 DIAGNOSIS — R590 Localized enlarged lymph nodes: Secondary | ICD-10-CM | POA: Diagnosis not present

## 2021-07-04 DIAGNOSIS — C3412 Malignant neoplasm of upper lobe, left bronchus or lung: Secondary | ICD-10-CM | POA: Diagnosis not present

## 2021-07-04 DIAGNOSIS — C7951 Secondary malignant neoplasm of bone: Secondary | ICD-10-CM | POA: Diagnosis not present

## 2021-07-04 DIAGNOSIS — E785 Hyperlipidemia, unspecified: Secondary | ICD-10-CM | POA: Diagnosis not present

## 2021-07-04 DIAGNOSIS — C7931 Secondary malignant neoplasm of brain: Secondary | ICD-10-CM | POA: Diagnosis not present

## 2021-07-04 DIAGNOSIS — Z510 Encounter for antineoplastic radiation therapy: Secondary | ICD-10-CM | POA: Diagnosis not present

## 2021-07-04 DIAGNOSIS — R69 Illness, unspecified: Secondary | ICD-10-CM | POA: Diagnosis not present

## 2021-07-04 DIAGNOSIS — J449 Chronic obstructive pulmonary disease, unspecified: Secondary | ICD-10-CM | POA: Diagnosis not present

## 2021-07-04 DIAGNOSIS — Z5111 Encounter for antineoplastic chemotherapy: Secondary | ICD-10-CM | POA: Diagnosis not present

## 2021-07-04 DIAGNOSIS — R630 Anorexia: Secondary | ICD-10-CM | POA: Diagnosis not present

## 2021-07-04 DIAGNOSIS — C3490 Malignant neoplasm of unspecified part of unspecified bronchus or lung: Secondary | ICD-10-CM | POA: Diagnosis not present

## 2021-07-04 DIAGNOSIS — E039 Hypothyroidism, unspecified: Secondary | ICD-10-CM | POA: Diagnosis not present

## 2021-07-04 LAB — CMP (CANCER CENTER ONLY)
ALT: 16 U/L (ref 0–44)
AST: 17 U/L (ref 15–41)
Albumin: 3.4 g/dL — ABNORMAL LOW (ref 3.5–5.0)
Alkaline Phosphatase: 67 U/L (ref 38–126)
Anion gap: 10 (ref 5–15)
BUN: 15 mg/dL (ref 8–23)
CO2: 26 mmol/L (ref 22–32)
Calcium: 9.4 mg/dL (ref 8.9–10.3)
Chloride: 106 mmol/L (ref 98–111)
Creatinine: 1.06 mg/dL — ABNORMAL HIGH (ref 0.44–1.00)
GFR, Estimated: 56 mL/min — ABNORMAL LOW (ref >60)
Glucose, Bld: 85 mg/dL (ref 70–99)
Potassium: 4.4 mmol/L (ref 3.5–5.1)
Sodium: 142 mmol/L (ref 135–145)
Total Bilirubin: 0.2 mg/dL — ABNORMAL LOW (ref 0.3–1.2)
Total Protein: 6.4 g/dL — ABNORMAL LOW (ref 6.5–8.1)

## 2021-07-04 LAB — CBC WITH DIFFERENTIAL (CANCER CENTER ONLY)
Abs Immature Granulocytes: 0.02 10*3/uL (ref 0.00–0.07)
Basophils Absolute: 0.1 10*3/uL (ref 0.0–0.1)
Basophils Relative: 2 %
Eosinophils Absolute: 0.3 10*3/uL (ref 0.0–0.5)
Eosinophils Relative: 5 %
HCT: 34.3 % — ABNORMAL LOW (ref 36.0–46.0)
Hemoglobin: 11.4 g/dL — ABNORMAL LOW (ref 12.0–15.0)
Immature Granulocytes: 0 %
Lymphocytes Relative: 28 %
Lymphs Abs: 1.5 10*3/uL (ref 0.7–4.0)
MCH: 32.3 pg (ref 26.0–34.0)
MCHC: 33.2 g/dL (ref 30.0–36.0)
MCV: 97.2 fL (ref 80.0–100.0)
Monocytes Absolute: 0.5 10*3/uL (ref 0.1–1.0)
Monocytes Relative: 9 %
Neutro Abs: 3 10*3/uL (ref 1.7–7.7)
Neutrophils Relative %: 56 %
Platelet Count: 284 10*3/uL (ref 150–400)
RBC: 3.53 MIL/uL — ABNORMAL LOW (ref 3.87–5.11)
RDW: 13.2 % (ref 11.5–15.5)
WBC Count: 5.3 10*3/uL (ref 4.0–10.5)
nRBC: 0 % (ref 0.0–0.2)

## 2021-07-04 MED ORDER — SODIUM CHLORIDE 0.9 % IV SOLN
140.0000 mg | Freq: Once | INTRAVENOUS | Status: AC
  Administered 2021-07-04: 140 mg via INTRAVENOUS
  Filled 2021-07-04: qty 14

## 2021-07-04 MED ORDER — SODIUM CHLORIDE 0.9 % IV SOLN: Freq: Once | INTRAVENOUS | Status: AC

## 2021-07-04 MED ORDER — SODIUM CHLORIDE 0.9 % IV SOLN
10.0000 mg | Freq: Once | INTRAVENOUS | Status: AC
  Administered 2021-07-04: 10 mg via INTRAVENOUS
  Filled 2021-07-04: qty 10

## 2021-07-04 MED ORDER — FAMOTIDINE 20 MG IN NS 100 ML IVPB
20.0000 mg | Freq: Once | INTRAVENOUS | Status: AC
  Administered 2021-07-04: 20 mg via INTRAVENOUS
  Filled 2021-07-04: qty 100

## 2021-07-04 MED ORDER — PALONOSETRON HCL INJECTION 0.25 MG/5ML
0.2500 mg | Freq: Once | INTRAVENOUS | Status: AC
  Administered 2021-07-04: 0.25 mg via INTRAVENOUS
  Filled 2021-07-04: qty 5

## 2021-07-04 MED ORDER — SODIUM CHLORIDE 0.9 % IV SOLN
45.0000 mg/m2 | Freq: Once | INTRAVENOUS | Status: AC
  Administered 2021-07-04: 78 mg via INTRAVENOUS
  Filled 2021-07-04: qty 13

## 2021-07-04 MED ORDER — DIPHENHYDRAMINE HCL 50 MG/ML IJ SOLN
50.0000 mg | Freq: Once | INTRAMUSCULAR | Status: AC
  Administered 2021-07-04: 50 mg via INTRAVENOUS
  Filled 2021-07-04: qty 1

## 2021-07-04 NOTE — Patient Instructions (Signed)
Steps to Quit Smoking Smoking tobacco is the leading cause of preventable death. It can affect almost every organ in the body. Smoking puts you and people around you at risk for many serious, long-lasting (chronic) diseases. Quitting smoking can be hard, but it is one of the best things that you can do for your health. It is never too late to quit. How do I get ready to quit? When you decide to quit smoking, make a plan to help you succeed. Before you quit: Pick a date to quit. Set a date within the next 2 weeks to give you time to prepare. Write down the reasons why you are quitting. Keep this list in places where you will see it often. Tell your family, friends, and co-workers that you are quitting. Their support is important. Talk with your doctor about the choices that may help you quit. Find out if your health insurance will pay for these treatments. Know the people, places, things, and activities that make you want to smoke (triggers). Avoid them. What first steps can I take to quit smoking? Throw away all cigarettes at home, at work, and in your car. Throw away the things that you use when you smoke, such as ashtrays and lighters. Clean your car. Make sure to empty the ashtray. Clean your home, including curtains and carpets. What can I do to help me quit smoking? Talk with your doctor about taking medicines and seeing a counselor at the same time. You are more likely to succeed when you do both. If you are pregnant or breastfeeding, talk with your doctor about counseling or other ways to quit smoking. Do not take medicine to help you quit smoking unless your doctor tells you to do so. To quit smoking: Quit right away Quit smoking totally, instead of slowly cutting back on how much you smoke over a period of time. Go to counseling. You are more likely to quit if you go to counseling sessions regularly. Take medicine You may take medicines to help you quit. Some medicines need a  prescription, and some you can buy over-the-counter. Some medicines may contain a drug called nicotine to replace the nicotine in cigarettes. Medicines may: Help you to stop having the desire to smoke (cravings). Help to stop the problems that come when you stop smoking (withdrawal symptoms). Your doctor may ask you to use: Nicotine patches, gum, or lozenges. Nicotine inhalers or sprays. Non-nicotine medicine that is taken by mouth. Find resources Find resources and other ways to help you quit smoking and remain smoke-free after you quit. These resources are most helpful when you use them often. They include: Online chats with a counselor. Phone quitlines. Printed self-help materials. Support groups or group counseling. Text messaging programs. Mobile phone apps. Use apps on your mobile phone or tablet that can help you stick to your quit plan. There are many free apps for mobile phones and tablets as well as websites. Examples include Quit Guide from the CDC and smokefree.gov  What things can I do to make it easier to quit?  Talk to your family and friends. Ask them to support and encourage you. Call a phone quitline (1-800-QUIT-NOW), reach out to support groups, or work with a counselor. Ask people who smoke to not smoke around you. Avoid places that make you want to smoke, such as: Bars. Parties. Smoke-break areas at work. Spend time with people who do not smoke. Lower the stress in your life. Stress can make you want to   smoke. Try these things to help your stress: Getting regular exercise. Doing deep-breathing exercises. Doing yoga. Meditating. Doing a body scan. To do this, close your eyes, focus on one area of your body at a time from head to toe. Notice which parts of your body are tense. Try to relax the muscles in those areas. How will I feel when I quit smoking? Day 1 to 3 weeks Within the first 24 hours, you may start to have some problems that come from quitting tobacco.  These problems are very bad 2-3 days after you quit, but they do not often last for more than 2-3 weeks. You may get these symptoms: Mood swings. Feeling restless, nervous, angry, or annoyed. Trouble concentrating. Dizziness. Strong desire for high-sugar foods and nicotine. Weight gain. Trouble pooping (constipation). Feeling like you may vomit (nausea). Coughing or a sore throat. Changes in how the medicines that you take for other issues work in your body. Depression. Trouble sleeping (insomnia). Week 3 and afterward After the first 2-3 weeks of quitting, you may start to notice more positive results, such as: Better sense of smell and taste. Less coughing and sore throat. Slower heart rate. Lower blood pressure. Clearer skin. Better breathing. Fewer sick days. Quitting smoking can be hard. Do not give up if you fail the first time. Some people need to try a few times before they succeed. Do your best to stick to your quit plan, and talk with your doctor if you have any questions or concerns. Summary Smoking tobacco is the leading cause of preventable death. Quitting smoking can be hard, but it is one of the best things that you can do for your health. When you decide to quit smoking, make a plan to help you succeed. Quit smoking right away, not slowly over a period of time. When you start quitting, seek help from your doctor, family, or friends. This information is not intended to replace advice given to you by your health care provider. Make sure you discuss any questions you have with your health care provider. Document Revised: 07/18/2019 Document Reviewed: 01/11/2019 [employer] Patient Education  2022 [employer]  

## 2021-07-04 NOTE — Progress Notes (Signed)
[facility] Telephone:([phone]) [phone]   Fax:(336) [phone]  OFFICE PROGRESS NOTE  [name], MD [place] [address]  DIAGNOSIS: Stage IV (T2 a, N2, M1b) non-small cell lung cancer, adenocarcinoma presented with left upper lobe lung mass in addition to left hilar and precarinal metastatic adenopathy and small left lower lobe pulmonary nodule in addition to solitary left frontal brain metastasis diagnosed in July 2022.  Molecular studies by [facility] showed  Positive KRAS G12C mutation PD-L1 expression was less than 1%   PRIOR THERAPY: Status post SRS to the solitary brain metastasis.  CURRENT THERAPY: Treatment for the locally advanced disease in the chest with carboplatin for AUC of 2 and paclitaxel 45 Mg/M2.  Status post 2 cycles.  INTERVAL HISTORY: Jill [name] 70 y.o. female returns to the clinic today for follow-up visit.  The patient is feeling fine today with no concerning complaints except for mild fatigue.  She continues to tolerate her treatment with concurrent chemoradiation fairly well.  Unfortunately she continues to smoke around 1 pack of cigarette every day.  She denied having any current chest pain, shortness of breath, cough or hemoptysis.  She denied having any fever or chills.  She has no nausea, vomiting, diarrhea or constipation.  She has no headache or visual changes.  She is here today for evaluation before starting cycle #3 of her treatment.  MEDICAL HISTORY: Past Medical History:  Diagnosis Date   Anxiety    ON PAXIL, XANAX   AVM (arteriovenous malformation) brain    s/p stent/coil   Blood transfusion    x 2   Cancer (HCC)    Left lung   COPD (chronic obstructive pulmonary disease) (HCC)    no inhaler, no oxygen   Coronary artery disease    Prior inferior MI with stent to RCA, s/p CABG in 2008   Depression    Dizziness    Dyspnea    with exertion, no oxygen   Fatigue    Foot injury 06/01/2017    right - RESOLVED, no longer an issue per patient 05/18/21   GERD (gastroesophageal reflux disease)    Headache(784.0)    UNRUPTURED CEREBRAL ANEURYSM   Hyperlipidemia    Hypertension    Hypothyroidism    Infected cyst of skin 09/18/2013   Left knee pain 06/05/2012   Lung mass    Left lung   Myocardial infarction ([facility])    Normal nuclear stress test Ju;y 2012   No ischemia. EF 70%; fixed defect involving septum, inferoseptal and inferior wall   Pain, dental 02/06/2017   Poor dental hygiene    Retroperitoneal bleeding    Following cardiac cath   Tobacco abuse    Urine discoloration 09/18/2013   UTI (lower urinary tract infection) 10/16/2013    ALLERGIES:  is allergic to varenicline, ace inhibitors, prednisone, penicillins, and sulfa drugs cross reactors.  MEDICATIONS:  Current Outpatient Medications  Medication Sig Dispense Refill   amLODipine (NORVASC) 10 MG tablet Take 1 tablet by mouth once daily 90 tablet 1   aspirin EC 81 MG tablet Take 1 tablet (81 mg total) by mouth daily. 90 tablet 2   atorvastatin (LIPITOR) 40 MG tablet Take 1 tablet (40 mg total) by mouth daily. 90 tablet 1   Cholecalciferol (VITAMIN D3) 250 MCG (10000 UT) capsule Take 10,000 mcg by mouth daily.     diphenhydramine-acetaminophen (TYLENOL PM) 25-500 MG TABS tablet Take 1 tablet by mouth every  other day. At bedtime     escitalopram (LEXAPRO) 10 MG tablet Take 1 tablet by mouth once daily 90 tablet 1   levothyroxine (SYNTHROID) 137 MCG tablet Take 1 tablet by mouth once daily 90 tablet 1   losartan (COZAAR) 100 MG tablet Take 1 tablet by mouth once daily 90 tablet 1   metoprolol succinate (TOPROL-XL) 50 MG 24 hr tablet TAKE 1 TABLET BY MOUTH ONCE DAILY.  TAKE  WITH  OR  IMMEDIATELY  FOLLOWING  A  MEAL. 90 tablet 1   Multiple Vitamins-Minerals (MULTIVITAMIN WITH MINERALS) tablet Take 1 tablet by mouth daily.     nitroGLYCERIN (NITROSTAT) 0.4 MG SL tablet Place 1 tablet (0.4 mg total) under the tongue every  5 (five) minutes as needed. For chest pain (Patient not taking: Reported on 06/06/2021) 25 tablet 6   Omega-3 Fatty Acids (FISH OIL) 1000 MG CAPS Take 2 capsules (2,000 mg total) by mouth in the morning and at bedtime. 120 capsule 1   omeprazole (PRILOSEC) 20 MG capsule Take 1 capsule (20 mg total) by mouth daily. Prolonged use may damage the kidney 90 capsule 1   prochlorperazine (COMPAZINE) 10 MG tablet Take 1 tablet (10 mg total) by mouth every 6 (six) hours as needed for nausea or vomiting. 30 tablet 0   Vitamin A 2400 MCG (8000 UT) TABS Take 2,400 mg by mouth daily.     vitamin B-12 (CYANOCOBALAMIN) 1000 MCG tablet Take 1,000 mcg by mouth daily.     No current facility-administered medications for this visit.    SURGICAL HISTORY:  Past Surgical History:  Procedure Laterality Date   CARDIAC CATHETERIZATION  01/02/2007   IT REVEALS MILD INFERIOR WALL HYPOKINESIS. THE EJECTION FRACTION IS AROUND 50%   COLONOSCOPY     CORONARY ARTERY BYPASS GRAFT  11/06/2006   LIMA to LAD, SVG to DX, SVG to LCX & SVG to OM 1 & 2, and SVG to PD   CORONARY STENT PLACEMENT     Remote past stent to RCA   EYE SURGERY Right    cataracts removed   HEMORRHOID SURGERY  11/07/1987   UPPER GI ENDOSCOPY     VENTRICULOSTOMY  10/06/2011   Procedure: VENTRICULOSTOMY;  Surgeon: Winfield Cunas;  Location: [facility];  Service: Neurosurgery;  Laterality: Right;  Insertion of Ventriculostomy Catheter   VIDEO BRONCHOSCOPY WITH ENDOBRONCHIAL NAVIGATION Left 05/20/2021   Procedure: VIDEO BRONCHOSCOPY WITH ENDOBRONCHIAL NAVIGATION;  Surgeon: Garner Nash, DO;  Location: [facility];  Service: Pulmonary;  Laterality: Left;   VIDEO BRONCHOSCOPY WITH ENDOBRONCHIAL ULTRASOUND Bilateral 05/20/2021   Procedure: VIDEO BRONCHOSCOPY WITH ENDOBRONCHIAL ULTRASOUND;  Surgeon: Garner Nash, DO;  Location: [facility];  Service: Pulmonary;  Laterality: Bilateral;   WISDOM TOOTH EXTRACTION      REVIEW OF SYSTEMS:  A comprehensive review of  systems was negative except for: Constitutional: positive for fatigue   PHYSICAL EXAMINATION: General appearance: alert, cooperative, and no distress Head: Normocephalic, without obvious abnormality, atraumatic Neck: no adenopathy, no JVD, supple, symmetrical, trachea midline, and thyroid not enlarged, symmetric, no tenderness/mass/nodules Lymph nodes: Cervical, supraclavicular, and axillary nodes normal. Resp: clear to auscultation bilaterally Back: symmetric, no curvature. ROM normal. No CVA tenderness. Cardio: regular rate and rhythm, S1, S2 normal, no murmur, click, rub or gallop GI: soft, non-tender; bowel sounds normal; no masses,  no organomegaly Extremities: extremities normal, atraumatic, no cyanosis or edema  ECOG PERFORMANCE STATUS: 1 - Symptomatic but completely ambulatory  Blood pressure 134/68, pulse 72, temperature (!)  97.4 F (36.3 C), temperature source Tympanic, resp. rate 20, height _0  (1.702 m), weight 134 lb (60.8 kg), SpO2 94 %.  LABORATORY DATA: Lab Results  Component Value Date   WBC 5.3 07/04/2021   HGB 11.4 (L) 07/04/2021   HCT 34.3 (L) 07/04/2021   MCV 97.2 07/04/2021   PLT 284 07/04/2021      Chemistry      Component Value Date/Time   NA 142 07/04/2021 0841   NA 141 04/12/2021 1356   K 4.4 07/04/2021 0841   CL 106 07/04/2021 0841   CO2 26 07/04/2021 0841   BUN 15 07/04/2021 0841   BUN 13 04/12/2021 1356   CREATININE 1.06 (H) 07/04/2021 0841   CREATININE 0.99 08/29/2016 1130      Component Value Date/Time   CALCIUM 9.4 07/04/2021 0841   ALKPHOS 67 07/04/2021 0841   AST 17 07/04/2021 0841   ALT 16 07/04/2021 0841   BILITOT 0.2 (L) 07/04/2021 0841       RADIOGRAPHIC STUDIES: MR Brain W Wo Contrast  Result Date: 06/10/2021 CLINICAL DATA:  Radiation treatment planning. History of lung cancer with brain metastases. EXAM: MRI HEAD WITHOUT AND WITH CONTRAST TECHNIQUE: Multiplanar, multiecho pulse sequences of the brain and surrounding  structures were obtained without and with intravenous contrast. CONTRAST:  73m MULTIHANCE GADOBENATE DIMEGLUMINE 529 MG/ML IV SOLN COMPARISON:  Brain MRI 06/03/2021. FINDINGS: Brain: Again seen is the 7 mm enhancing lesion in the left superior frontal gyrus near the vertex, similar in size but has slightly increased in conspicuity since the prior study, possibly due to difference in technique. Mild FLAIR signal surrounding this lesion is unchanged. No new lesions are seen. Scattered other foci of FLAIR signal abnormality throughout the remainder of the subcortical and periventricular white matter are unchanged, again nonspecific but likely reflecting sequela of chronic white matter microangiopathy. There is no evidence of acute intracranial hemorrhage, extra-axial fluid collection, or infarct. A tract from a prior right frontal ventricular catheter with associated gliosis and chronic blood products is unchanged. The ventricles are stable in size. There is no midline shift. Smooth diffuse pachymeningeal thickening and enhancement is unchanged, nonspecific. Vascular: Postprocedural changes reflecting treatment of a prior basilar aneurysm are again seen. The major flow voids are present. Skull and upper cervical spine: Marrow signal is normal. Sinuses/Orbits: A right lens implant is noted. The left globe and orbit are unremarkable. There is mild mucosal thickening in the right maxillary and sphenoid sinuses with layering fluid in the right maxillary sinus, unchanged. Other: A nonenhancing diffusion restricting lesion in the left frontal scalp may reflect an epidermal inclusion cyst. IMPRESSION: No significant interval change in size of the enhancing metastatic lesion in the left superior frontal gyrus. The increase in conspicuity is favored due to difference in technique. No new lesions. Electronically Signed   By: PValetta MoleMD   On: 06/10/2021 15:43    ASSESSMENT AND PLAN: This is a very pleasant 70years old  white female diagnosed with a stage IV (T2 a, N2, M1 B) non-small cell lung cancer, adenocarcinoma presented with left upper lobe lung mass in addition to left hilar and precarinal metastatic adenopathy as well as small left lower lobe pulmonary nodule and solitary left frontal brain metastasis diagnosed in July 2022.  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 The patient is status post SRS to the solitary brain metastasis. She is currently undergoing a course of concurrent chemoradiation to the locally  advanced disease in the chest with carboplatin for AUC of 2 and paclitaxel 45 Mg/M2 status post 2 cycles.  The patient has been tolerating her treatment well with no concerning adverse effects. I recommended for her to proceed with cycle #3 today as planned. I will see her back for follow-up visit in 2 weeks for evaluation before starting cycle #5. For the smoking cessation, I strongly encouraged the patient to quit smoking. She was advised to call immediately if she has any other concerning symptoms in the interval. The patient voices understanding of current disease status and treatment options and is in agreement with the current care plan.  All questions were answered. The patient knows to call the clinic with any problems, questions or concerns. We can certainly see the patient much sooner if necessary.  The total time spent in the appointment was 20 minutes.  Disclaimer: This note was dictated with voice recognition software. Similar sounding words can inadvertently be transcribed and may not be corrected upon review.

## 2021-07-04 NOTE — Patient Instructions (Signed)
[facility] ONCOLOG[facility]  Discharge Instructions: Thank you for choosing [facility] to provide your oncology and hematology care.   If you have a lab appointment with the [facility], please go directly to the [facility] and check in at the registration area.   Wear comfortable clothing and clothing appropriate for easy access to any Portacath or PICC line.   We strive to give you quality time with your provider. You may need to reschedule your appointment if you arrive late (15 or more minutes).  Arriving late affects you and other patients whose appointments are after yours.  Also, if you miss three or more appointments without notifying the office, you may be dismissed from the clinic at the provider's discretion.      For prescription refill requests, have your pharmacy contact our office and allow 72 hours for refills to be completed.    Today you received the following chemotherapy and/or immunotherapy agents Carboplatin and Taxol      To help prevent nausea and vomiting after your treatment, we encourage you to take your nausea medication as directed.  BELOW ARE SYMPTOMS THAT SHOULD BE REPORTED IMMEDIATELY: *FEVER GREATER THAN 100.4 F (38 C) OR HIGHER *CHILLS OR SWEATING *NAUSEA AND VOMITING THAT IS NOT CONTROLLED WITH YOUR NAUSEA MEDICATION *UNUSUAL SHORTNESS OF BREATH *UNUSUAL BRUISING OR BLEEDING *URINARY PROBLEMS (pain or burning when urinating, or frequent urination) *BOWEL PROBLEMS (unusual diarrhea, constipation, pain near the anus) TENDERNESS IN MOUTH AND THROAT WITH OR WITHOUT PRESENCE OF ULCERS (sore throat, sores in mouth, or a toothache) UNUSUAL RASH, SWELLING OR PAIN  UNUSUAL VAGINAL DISCHARGE OR ITCHING   Items with * indicate a potential emergency and should be followed up as soon as possible or go to the Emergency Department if any problems should occur.  Please show the CHEMOTHERAPY ALERT CARD or IMMUNOTHERAPY ALERT CARD at  check-in to the Emergency Department and triage nurse.  Should you have questions after your visit or need to cancel or reschedule your appointment, please contact [facility]  Dept: [phone]  and follow the prompts.  Office hours are 8:00 a.m. to 4:30 p.m. Monday - Friday. Please note that voicemails left after 4:00 p.m. may not be returned until the following business day.  We are closed weekends and major holidays. You have access to a nurse at all times for urgent questions. Please call the main number to the clinic Dept: [phone] and follow the prompts.   For any non-urgent questions, you may also contact your provider using MyChart. We now offer e-Visits for anyone 23 and older to request care online for non-urgent symptoms. For details visit [email or web address].   Also download the MyChart app! Go to the app store, search "MyChart", open the app, select [facility], and log in with your MyChart username and password.  Due to Covid, a mask is required upon entering the hospital/clinic. If you do not have a mask, one will be given to you upon arrival. For doctor visits, patients may have 1 support person aged 6 or older with them. For treatment visits, patients cannot have anyone with them due to current Covid guidelines and our immunocompromised population.

## 2021-07-05 ENCOUNTER — Ambulatory Visit
Admission: RE | Admit: 2021-07-05 | Discharge: 2021-07-05 | Disposition: A | Discharge status: Home / Self Care | Payer: Medicare HMO | Source: Ambulatory Visit | Attending: Radiation Oncology | Admitting: Radiation Oncology

## 2021-07-05 DIAGNOSIS — Z510 Encounter for antineoplastic radiation therapy: Secondary | ICD-10-CM | POA: Diagnosis not present

## 2021-07-05 DIAGNOSIS — C3412 Malignant neoplasm of upper lobe, left bronchus or lung: Secondary | ICD-10-CM | POA: Diagnosis not present

## 2021-07-05 DIAGNOSIS — C7931 Secondary malignant neoplasm of brain: Secondary | ICD-10-CM | POA: Diagnosis not present

## 2021-07-05 DIAGNOSIS — C3490 Malignant neoplasm of unspecified part of unspecified bronchus or lung: Secondary | ICD-10-CM | POA: Diagnosis not present

## 2021-07-06 ENCOUNTER — Ambulatory Visit
Admission: RE | Admit: 2021-07-06 | Discharge: 2021-07-06 | Disposition: A | Discharge status: Home / Self Care | Payer: Medicare HMO | Source: Ambulatory Visit | Attending: Radiation Oncology | Admitting: Radiation Oncology

## 2021-07-06 ENCOUNTER — Other Ambulatory Visit: Payer: Self-pay

## 2021-07-06 DIAGNOSIS — C3412 Malignant neoplasm of upper lobe, left bronchus or lung: Secondary | ICD-10-CM | POA: Diagnosis not present

## 2021-07-06 DIAGNOSIS — C7931 Secondary malignant neoplasm of brain: Secondary | ICD-10-CM | POA: Diagnosis not present

## 2021-07-06 DIAGNOSIS — C3490 Malignant neoplasm of unspecified part of unspecified bronchus or lung: Secondary | ICD-10-CM | POA: Diagnosis not present

## 2021-07-06 DIAGNOSIS — Z510 Encounter for antineoplastic radiation therapy: Secondary | ICD-10-CM | POA: Diagnosis not present

## 2021-07-07 ENCOUNTER — Ambulatory Visit
Admission: RE | Admit: 2021-07-07 | Discharge: 2021-07-07 | Disposition: A | Discharge status: Home / Self Care | Payer: Medicare HMO | Source: Ambulatory Visit | Attending: Radiation Oncology | Admitting: Radiation Oncology

## 2021-07-07 ENCOUNTER — Encounter (HOSPITAL_COMMUNITY): Payer: Self-pay | Admitting: Internal Medicine

## 2021-07-07 DIAGNOSIS — C3412 Malignant neoplasm of upper lobe, left bronchus or lung: Secondary | ICD-10-CM | POA: Diagnosis not present

## 2021-07-07 DIAGNOSIS — Z510 Encounter for antineoplastic radiation therapy: Secondary | ICD-10-CM | POA: Insufficient documentation

## 2021-07-07 DIAGNOSIS — C3490 Malignant neoplasm of unspecified part of unspecified bronchus or lung: Secondary | ICD-10-CM | POA: Diagnosis not present

## 2021-07-07 DIAGNOSIS — C7931 Secondary malignant neoplasm of brain: Secondary | ICD-10-CM | POA: Insufficient documentation

## 2021-07-08 ENCOUNTER — Other Ambulatory Visit: Payer: Self-pay | Admitting: Medical Oncology

## 2021-07-08 ENCOUNTER — Other Ambulatory Visit: Payer: Self-pay

## 2021-07-08 ENCOUNTER — Ambulatory Visit
Admission: RE | Admit: 2021-07-08 | Discharge: 2021-07-08 | Disposition: A | Discharge status: Home / Self Care | Payer: Medicare HMO | Source: Ambulatory Visit | Attending: Radiation Oncology | Admitting: Radiation Oncology

## 2021-07-08 DIAGNOSIS — C3490 Malignant neoplasm of unspecified part of unspecified bronchus or lung: Secondary | ICD-10-CM | POA: Diagnosis not present

## 2021-07-08 DIAGNOSIS — Z510 Encounter for antineoplastic radiation therapy: Secondary | ICD-10-CM | POA: Diagnosis not present

## 2021-07-08 DIAGNOSIS — C3412 Malignant neoplasm of upper lobe, left bronchus or lung: Secondary | ICD-10-CM | POA: Diagnosis not present

## 2021-07-08 DIAGNOSIS — C7931 Secondary malignant neoplasm of brain: Secondary | ICD-10-CM | POA: Diagnosis not present

## 2021-07-12 ENCOUNTER — Ambulatory Visit
Admission: RE | Admit: 2021-07-12 | Discharge: 2021-07-12 | Disposition: A | Discharge status: Home / Self Care | Payer: Medicare HMO | Source: Ambulatory Visit | Attending: Radiation Oncology | Admitting: Radiation Oncology

## 2021-07-12 ENCOUNTER — Other Ambulatory Visit: Payer: Self-pay

## 2021-07-12 ENCOUNTER — Inpatient Hospital Stay: Payer: Medicare HMO

## 2021-07-12 ENCOUNTER — Inpatient Hospital Stay: Payer: Medicare HMO | Attending: Internal Medicine

## 2021-07-12 VITALS — BP 110/69 | HR 63 | Temp 98.2°F | Resp 18

## 2021-07-12 DIAGNOSIS — C3412 Malignant neoplasm of upper lobe, left bronchus or lung: Secondary | ICD-10-CM | POA: Insufficient documentation

## 2021-07-12 DIAGNOSIS — Z8719 Personal history of other diseases of the digestive system: Secondary | ICD-10-CM | POA: Diagnosis not present

## 2021-07-12 DIAGNOSIS — C3490 Malignant neoplasm of unspecified part of unspecified bronchus or lung: Secondary | ICD-10-CM | POA: Diagnosis not present

## 2021-07-12 DIAGNOSIS — Z882 Allergy status to sulfonamides status: Secondary | ICD-10-CM | POA: Diagnosis not present

## 2021-07-12 DIAGNOSIS — Z955 Presence of coronary angioplasty implant and graft: Secondary | ICD-10-CM | POA: Insufficient documentation

## 2021-07-12 DIAGNOSIS — Z888 Allergy status to other drugs, medicaments and biological substances status: Secondary | ICD-10-CM | POA: Insufficient documentation

## 2021-07-12 DIAGNOSIS — C779 Secondary and unspecified malignant neoplasm of lymph node, unspecified: Secondary | ICD-10-CM | POA: Diagnosis not present

## 2021-07-12 DIAGNOSIS — K5900 Constipation, unspecified: Secondary | ICD-10-CM | POA: Diagnosis not present

## 2021-07-12 DIAGNOSIS — Z87440 Personal history of urinary (tract) infections: Secondary | ICD-10-CM | POA: Insufficient documentation

## 2021-07-12 DIAGNOSIS — Z5111 Encounter for antineoplastic chemotherapy: Secondary | ICD-10-CM | POA: Insufficient documentation

## 2021-07-12 DIAGNOSIS — K047 Periapical abscess without sinus: Secondary | ICD-10-CM | POA: Diagnosis not present

## 2021-07-12 DIAGNOSIS — C7931 Secondary malignant neoplasm of brain: Secondary | ICD-10-CM | POA: Diagnosis not present

## 2021-07-12 DIAGNOSIS — Z880 Allergy status to penicillin: Secondary | ICD-10-CM | POA: Insufficient documentation

## 2021-07-12 DIAGNOSIS — Z951 Presence of aortocoronary bypass graft: Secondary | ICD-10-CM | POA: Insufficient documentation

## 2021-07-12 DIAGNOSIS — Z79899 Other long term (current) drug therapy: Secondary | ICD-10-CM | POA: Diagnosis not present

## 2021-07-12 DIAGNOSIS — E039 Hypothyroidism, unspecified: Secondary | ICD-10-CM | POA: Diagnosis not present

## 2021-07-12 DIAGNOSIS — Z510 Encounter for antineoplastic radiation therapy: Secondary | ICD-10-CM | POA: Diagnosis not present

## 2021-07-12 DIAGNOSIS — R058 Other specified cough: Secondary | ICD-10-CM | POA: Insufficient documentation

## 2021-07-12 DIAGNOSIS — R5383 Other fatigue: Secondary | ICD-10-CM | POA: Diagnosis not present

## 2021-07-12 DIAGNOSIS — E785 Hyperlipidemia, unspecified: Secondary | ICD-10-CM | POA: Diagnosis not present

## 2021-07-12 DIAGNOSIS — F17210 Nicotine dependence, cigarettes, uncomplicated: Secondary | ICD-10-CM | POA: Insufficient documentation

## 2021-07-12 DIAGNOSIS — J449 Chronic obstructive pulmonary disease, unspecified: Secondary | ICD-10-CM | POA: Insufficient documentation

## 2021-07-12 LAB — CBC WITH DIFFERENTIAL (CANCER CENTER ONLY)
Abs Immature Granulocytes: 0.01 10*3/uL (ref 0.00–0.07)
Basophils Absolute: 0.1 10*3/uL (ref 0.0–0.1)
Basophils Relative: 1 %
Eosinophils Absolute: 0.2 10*3/uL (ref 0.0–0.5)
Eosinophils Relative: 4 %
HCT: 35.4 % — ABNORMAL LOW (ref 36.0–46.0)
Hemoglobin: 11.6 g/dL — ABNORMAL LOW (ref 12.0–15.0)
Immature Granulocytes: 0 %
Lymphocytes Relative: 17 %
Lymphs Abs: 0.9 10*3/uL (ref 0.7–4.0)
MCH: 31.6 pg (ref 26.0–34.0)
MCHC: 32.8 g/dL (ref 30.0–36.0)
MCV: 96.5 fL (ref 80.0–100.0)
Monocytes Absolute: 0.6 10*3/uL (ref 0.1–1.0)
Monocytes Relative: 11 %
Neutro Abs: 3.6 10*3/uL (ref 1.7–7.7)
Neutrophils Relative %: 67 %
Platelet Count: 256 10*3/uL (ref 150–400)
RBC: 3.67 MIL/uL — ABNORMAL LOW (ref 3.87–5.11)
RDW: 13.7 % (ref 11.5–15.5)
WBC Count: 5.4 10*3/uL (ref 4.0–10.5)
nRBC: 0 % (ref 0.0–0.2)

## 2021-07-12 LAB — CMP (CANCER CENTER ONLY)
ALT: 16 U/L (ref 0–44)
AST: 15 U/L (ref 15–41)
Albumin: 3.4 g/dL — ABNORMAL LOW (ref 3.5–5.0)
Alkaline Phosphatase: 67 U/L (ref 38–126)
Anion gap: 9 (ref 5–15)
BUN: 11 mg/dL (ref 8–23)
CO2: 25 mmol/L (ref 22–32)
Calcium: 9.4 mg/dL (ref 8.9–10.3)
Chloride: 106 mmol/L (ref 98–111)
Creatinine: 0.88 mg/dL (ref 0.44–1.00)
GFR, Estimated: >60 mL/min (ref >60)
Glucose, Bld: 108 mg/dL — ABNORMAL HIGH (ref 70–99)
Potassium: 4.2 mmol/L (ref 3.5–5.1)
Sodium: 140 mmol/L (ref 135–145)
Total Bilirubin: 0.3 mg/dL (ref 0.3–1.2)
Total Protein: 6.5 g/dL (ref 6.5–8.1)

## 2021-07-12 MED ORDER — SODIUM CHLORIDE 0.9 % IV SOLN
140.0000 mg | Freq: Once | INTRAVENOUS | Status: AC
  Administered 2021-07-12: 140 mg via INTRAVENOUS
  Filled 2021-07-12: qty 14

## 2021-07-12 MED ORDER — SODIUM CHLORIDE 0.9 % IV SOLN
45.0000 mg/m2 | Freq: Once | INTRAVENOUS | Status: AC
  Administered 2021-07-12: 78 mg via INTRAVENOUS
  Filled 2021-07-12: qty 13

## 2021-07-12 MED ORDER — FAMOTIDINE 20 MG IN NS 100 ML IVPB
20.0000 mg | Freq: Once | INTRAVENOUS | Status: AC
  Administered 2021-07-12: 20 mg via INTRAVENOUS
  Filled 2021-07-12: qty 100

## 2021-07-12 MED ORDER — PALONOSETRON HCL INJECTION 0.25 MG/5ML
0.2500 mg | Freq: Once | INTRAVENOUS | Status: AC
  Administered 2021-07-12: 0.25 mg via INTRAVENOUS
  Filled 2021-07-12: qty 5

## 2021-07-12 MED ORDER — DIPHENHYDRAMINE HCL 50 MG/ML IJ SOLN
50.0000 mg | Freq: Once | INTRAMUSCULAR | Status: AC
  Administered 2021-07-12: 50 mg via INTRAVENOUS
  Filled 2021-07-12: qty 1

## 2021-07-12 MED ORDER — SODIUM CHLORIDE 0.9 % IV SOLN
Freq: Once | INTRAVENOUS | Status: AC
Start: 2021-07-12 — End: 2021-07-12

## 2021-07-12 MED ORDER — SODIUM CHLORIDE 0.9 % IV SOLN
10.0000 mg | Freq: Once | INTRAVENOUS | Status: AC
  Administered 2021-07-12: 10 mg via INTRAVENOUS
  Filled 2021-07-12: qty 10

## 2021-07-12 NOTE — Patient Instructions (Signed)
[facility]   Discharge Instructions: Thank you for choosing [facility] to provide your oncology and hematology care.   If you have a lab appointment with the Cancer Center, please go directly to the Cancer Center and check in at the registration area.   Wear comfortable clothing and clothing appropriate for easy access to any Portacath or PICC line.   We strive to give you quality time with your provider. You may need to reschedule your appointment if you arrive late (15 or more minutes).  Arriving late affects you and other patients whose appointments are after yours.  Also, if you miss three or more appointments without notifying the office, you may be dismissed from the clinic at the provider's discretion.      For prescription refill requests, have your pharmacy contact our office and allow 72 hours for refills to be completed.    Today you received the following chemotherapy and/or immunotherapy agents: paclitaxel and carboplatin.      To help prevent nausea and vomiting after your treatment, we encourage you to take your nausea medication as directed.  BELOW ARE SYMPTOMS THAT SHOULD BE REPORTED IMMEDIATELY: *FEVER GREATER THAN 100.4 F (38 C) OR HIGHER *CHILLS OR SWEATING *NAUSEA AND VOMITING THAT IS NOT CONTROLLED WITH YOUR NAUSEA MEDICATION *UNUSUAL SHORTNESS OF BREATH *UNUSUAL BRUISING OR BLEEDING *URINARY PROBLEMS (pain or burning when urinating, or frequent urination) *BOWEL PROBLEMS (unusual diarrhea, constipation, pain near the anus) TENDERNESS IN MOUTH AND THROAT WITH OR WITHOUT PRESENCE OF ULCERS (sore throat, sores in mouth, or a toothache) UNUSUAL RASH, SWELLING OR PAIN  UNUSUAL VAGINAL DISCHARGE OR ITCHING   Items with * indicate a potential emergency and should be followed up as soon as possible or go to the Emergency Department if any problems should occur.  Please show the CHEMOTHERAPY ALERT CARD or IMMUNOTHERAPY ALERT  CARD at check-in to the Emergency Department and triage nurse.  Should you have questions after your visit or need to cancel or reschedule your appointment, please contact [facility]  Dept: [phone]  and follow the prompts.  Office hours are 8:00 a.m. to 4:30 p.m. Monday - Friday. Please note that voicemails left after 4:00 p.m. may not be returned until the following business day.  We are closed weekends and major holidays. You have access to a nurse at all times for urgent questions. Please call the main number to the clinic Dept: [phone] and follow the prompts.   For any non-urgent questions, you may also contact your provider using MyChart. We now offer e-Visits for anyone 18 and older to request care online for non-urgent symptoms. For details visit [email or web address].   Also download the MyChart app! Go to the app store, search "MyChart", open the app, select [facility], and log in with your MyChart username and password.  Due to Covid, a mask is required upon entering the hospital/clinic. If you do not have a mask, one will be given to you upon arrival. For doctor visits, patients may have 1 support person aged 18 or older with them. For treatment visits, patients cannot have anyone with them due to current Covid guidelines and our immunocompromised population.   

## 2021-07-13 ENCOUNTER — Ambulatory Visit
Admission: RE | Admit: 2021-07-13 | Discharge: 2021-07-13 | Disposition: A | Discharge status: Home / Self Care | Payer: Medicare HMO | Source: Ambulatory Visit | Attending: Radiation Oncology | Admitting: Radiation Oncology

## 2021-07-13 DIAGNOSIS — Z510 Encounter for antineoplastic radiation therapy: Secondary | ICD-10-CM | POA: Diagnosis not present

## 2021-07-13 DIAGNOSIS — C3412 Malignant neoplasm of upper lobe, left bronchus or lung: Secondary | ICD-10-CM | POA: Diagnosis not present

## 2021-07-13 DIAGNOSIS — C7931 Secondary malignant neoplasm of brain: Secondary | ICD-10-CM | POA: Diagnosis not present

## 2021-07-13 DIAGNOSIS — C3490 Malignant neoplasm of unspecified part of unspecified bronchus or lung: Secondary | ICD-10-CM | POA: Diagnosis not present

## 2021-07-13 NOTE — Progress Notes (Signed)
[facility] OFFICE PROGRESS NOTE  [name], MD [place] [address]  DIAGNOSIS: Stage IV (T2 a, N2, M1b) non-small cell lung cancer, adenocarcinoma presented with left upper lobe lung mass in addition to left hilar and precarinal metastatic adenopathy and small left lower lobe pulmonary nodule in addition to solitary left frontal brain metastasis diagnosed in July 2022.   Molecular studies by [facility] showed  Positive KRAS G12C mutation PD-L1 expression was less than 1%  PRIOR THERAPY: Status post SRS to the solitary brain metastasis under the care of Dr. Tammi Klippel. Completed on 06/23/21  CURRENT THERAPY: Treatment for the locally advanced disease in the chest with carboplatin for AUC of 2 and paclitaxel 45 Mg/M2.  Status post 4 cycles.  INTERVAL HISTORY: Jill [name] 70 y.o. female returns to the clinic for a follow up visit. The patient is feeling well today without any concerning complaints.  Her last day radiation is scheduled for 08/01/2021. The patient continues to tolerate treatment with concurrent chemo/radiation well without any adverse effects except hair thinning. Denies any fever, chills, night sweats, or weight loss. She drinks ensures. She states her shortness of breath has significantly improved and she can walk farther without feeling short of breath. She does note a productive cough that produces phlegm. She sometimes takes mucinex but does not take cough medications. She unfortunately continues to smoke about 1 ppd. She knows she needs to work on smoking cessation. She was seen in the ER on 07/14/21 for an abscessed tooth and she is on clindamycin. She notes she has poor dentition. Denies any chest pain or hemoptysis.  She continues to smoke approximately 1 pack of cigarettes per day.  Denies any nausea, vomiting, or diarrhea. She has mild constipation and will take milk of magnesia. Denies any headache or visual changes. The patient  is here today for evaluation prior to starting cycle # 5   MEDICAL HISTORY: Past Medical History:  Diagnosis Date   Anxiety    ON PAXIL, XANAX   AVM (arteriovenous malformation) brain    s/p stent/coil   Blood transfusion    x 2   Cancer (HCC)    Left lung   COPD (chronic obstructive pulmonary disease) (HCC)    no inhaler, no oxygen   Coronary artery disease    Prior inferior MI with stent to RCA, s/p CABG in 2008   Depression    Dizziness    Dyspnea    with exertion, no oxygen   Fatigue    Foot injury 06/01/2017   right - RESOLVED, no longer an issue per patient 05/18/21   GERD (gastroesophageal reflux disease)    Headache(784.0)    UNRUPTURED CEREBRAL ANEURYSM   Hyperlipidemia    Hypertension    Hypothyroidism    Infected cyst of skin 09/18/2013   Left knee pain 06/05/2012   Lung mass    Left lung   Myocardial infarction ([facility])    Normal nuclear stress test Ju;y 2012   No ischemia. EF 70%; fixed defect involving septum, inferoseptal and inferior wall   Pain, dental 02/06/2017   Poor dental hygiene    Retroperitoneal bleeding    Following cardiac cath   Tobacco abuse    Urine discoloration 09/18/2013   UTI (lower urinary tract infection) 10/16/2013    ALLERGIES:  is allergic to varenicline, ace inhibitors, prednisone, penicillins, and sulfa drugs cross reactors.  MEDICATIONS:  Current Outpatient Medications  Medication Sig Dispense Refill  amLODipine (NORVASC) 10 MG tablet Take 1 tablet by mouth once daily 90 tablet 1   aspirin EC 81 MG tablet Take 1 tablet (81 mg total) by mouth daily. 90 tablet 2   atorvastatin (LIPITOR) 40 MG tablet Take 1 tablet (40 mg total) by mouth daily. 90 tablet 1   Cholecalciferol (VITAMIN D3) 250 MCG (10000 UT) capsule Take 10,000 mcg by mouth daily.     clindamycin (CLEOCIN) 150 MG capsule Take 2 capsules (300 mg total) by mouth 3 (three) times daily for 10 days. 60 capsule 0   diphenhydramine-acetaminophen (TYLENOL PM) 25-500 MG  TABS tablet Take 1 tablet by mouth every other day. At bedtime     escitalopram (LEXAPRO) 10 MG tablet Take 1 tablet by mouth once daily 90 tablet 1   levothyroxine (SYNTHROID) 137 MCG tablet Take 1 tablet by mouth once daily 90 tablet 1   losartan (COZAAR) 100 MG tablet Take 1 tablet by mouth once daily 90 tablet 1   metoprolol succinate (TOPROL-XL) 50 MG 24 hr tablet TAKE 1 TABLET BY MOUTH ONCE DAILY.  TAKE  WITH  OR  IMMEDIATELY  FOLLOWING  A  MEAL. 90 tablet 1   Multiple Vitamins-Minerals (MULTIVITAMIN WITH MINERALS) tablet Take 1 tablet by mouth daily.     nitroGLYCERIN (NITROSTAT) 0.4 MG SL tablet Place 1 tablet (0.4 mg total) under the tongue every 5 (five) minutes as needed. For chest pain (Patient not taking: Reported on 06/06/2021) 25 tablet 6   Omega-3 Fatty Acids (FISH OIL) 1000 MG CAPS Take 2 capsules (2,000 mg total) by mouth in the morning and at bedtime. 120 capsule 1   omeprazole (PRILOSEC) 20 MG capsule Take 1 capsule (20 mg total) by mouth daily. Prolonged use may damage the kidney 90 capsule 1   prochlorperazine (COMPAZINE) 10 MG tablet Take 1 tablet (10 mg total) by mouth every 6 (six) hours as needed for nausea or vomiting. 30 tablet 0   Vitamin A 2400 MCG (8000 UT) TABS Take 2,400 mg by mouth daily.     vitamin B-12 (CYANOCOBALAMIN) 1000 MCG tablet Take 1,000 mcg by mouth daily.     No current facility-administered medications for this visit.   Facility-Administered Medications Ordered in Other Visits  Medication Dose Route Frequency Provider Last Rate Last Admin   CARBOplatin (PARAPLATIN) 140 mg in sodium chloride 0.9 % 100 mL chemo infusion  140 mg Intravenous Once Curt Bears, MD       dexamethasone (DECADRON) 10 mg in sodium chloride 0.9 % 50 mL IVPB  10 mg Intravenous Once Curt Bears, MD       diphenhydrAMINE (BENADRYL) injection 50 mg  50 mg Intravenous Once Curt Bears, MD       famotidine (PEPCID) IVPB 20 mg in NS 100 mL IVPB  20 mg Intravenous Once  Curt Bears, MD       PACLitaxel (TAXOL) 78 mg in sodium chloride 0.9 % 250 mL chemo infusion (</= 88m/m2)  45 mg/m2 (Treatment Plan Recorded) Intravenous Once MCurt Bears MD       palonosetron (ALOXI) injection 0.25 mg  0.25 mg Intravenous Once MCurt Bears MD        SURGICAL HISTORY:  Past Surgical History:  Procedure Laterality Date   CARDIAC CATHETERIZATION  01/02/2007   IT REVEALS MILD INFERIOR WALL HYPOKINESIS. THE EJECTION FRACTION IS AROUND 50%   COLONOSCOPY     CORONARY ARTERY BYPASS GRAFT  11/06/2006   LIMA to LAD, SVG to DX, SVG to LCX &  SVG to OM 1 & 2, and SVG to PD   CORONARY STENT PLACEMENT     Remote past stent to RCA   EYE SURGERY Right    cataracts removed   HEMORRHOID SURGERY  11/07/1987   UPPER GI ENDOSCOPY     VENTRICULOSTOMY  10/06/2011   Procedure: VENTRICULOSTOMY;  Surgeon: Winfield Cunas;  Location: [facility];  Service: Neurosurgery;  Laterality: Right;  Insertion of Ventriculostomy Catheter   VIDEO BRONCHOSCOPY WITH ENDOBRONCHIAL NAVIGATION Left 05/20/2021   Procedure: VIDEO BRONCHOSCOPY WITH ENDOBRONCHIAL NAVIGATION;  Surgeon: Garner Nash, DO;  Location: [facility];  Service: Pulmonary;  Laterality: Left;   VIDEO BRONCHOSCOPY WITH ENDOBRONCHIAL ULTRASOUND Bilateral 05/20/2021   Procedure: VIDEO BRONCHOSCOPY WITH ENDOBRONCHIAL ULTRASOUND;  Surgeon: Garner Nash, DO;  Location: [facility];  Service: Pulmonary;  Laterality: Bilateral;   WISDOM TOOTH EXTRACTION      REVIEW OF SYSTEMS:   Review of Systems  Constitutional: Positive for fatigue. Negative for appetite change, chills, fever and unexpected weight change.  HENT: Negative for mouth sores, nosebleeds, sore throat and trouble swallowing.   Eyes: Negative for eye problems and icterus.  Respiratory: Positive for baseline productive cough.  Improved shortness of breath with exertion.  Negative for hemoptysis and wheezing.   Cardiovascular: Negative for chest pain and leg swelling.   Gastrointestinal: Positive for mild constipation.  Negative for abdominal pain, diarrhea, nausea and vomiting.  Genitourinary: Negative for bladder incontinence, difficulty urinating, dysuria, frequency and hematuria.   Musculoskeletal: Negative for back pain, gait problem, neck pain and neck stiffness.  Skin: Negative for itching and rash.  Neurological: Negative for dizziness, extremity weakness, gait problem, headaches, light-headedness and seizures.  Hematological: Negative for adenopathy. Does not bruise/bleed easily.  Psychiatric/Behavioral: Negative for confusion, depression and sleep disturbance. The patient is not nervous/anxious.     PHYSICAL EXAMINATION:  Blood pressure 115/71, pulse 67, temperature (!) 96.4 F (35.8 C), temperature source Oral, resp. rate 17, weight 136 lb 1.6 oz (61.7 kg), SpO2 95 %.  ECOG PERFORMANCE STATUS: 1  Physical Exam  Constitutional: Oriented to person, place, and time and thin appearing female and in no distress.  HENT:  Head: Normocephalic and atraumatic.  Mouth/Throat: Oropharynx is clear and moist. No oropharyngeal exudate.  Eyes: Conjunctivae are normal. Right eye exhibits no discharge. Left eye exhibits no discharge. No scleral icterus.  Neck: Normal range of motion. Neck supple.  Cardiovascular: Normal rate, regular rhythm, normal heart sounds and intact distal pulses.   Pulmonary/Chest: Effort normal.  Decreased breath sounds in all lung fields.  No respiratory distress. No wheezes. No rales.  Abdominal: Soft. Bowel sounds are normal. Exhibits no distension and no mass. There is no tenderness.  Musculoskeletal: Normal range of motion. Exhibits no edema.  Lymphadenopathy:    No cervical adenopathy.  Neurological: Alert and oriented to person, place, and time. Exhibits normal muscle tone. Gait normal. Coordination normal.  Skin: Skin is warm and dry. No rash noted. Not diaphoretic. No erythema. No pallor.  Psychiatric: Mood, memory and  judgment normal.  Vitals reviewed.  LABORATORY DATA: Lab Results  Component Value Date   WBC 4.5 07/18/2021   HGB 11.2 (L) 07/18/2021   HCT 33.1 (L) 07/18/2021   MCV 95.7 07/18/2021   PLT 202 07/18/2021      Chemistry      Component Value Date/Time   NA 139 07/18/2021 0932   NA 141 04/12/2021 1356   K 4.4 07/18/2021 0932   CL 106 07/18/2021 0932  CO2 25 07/18/2021 0932   BUN 20 07/18/2021 0932   BUN 13 04/12/2021 1356   CREATININE 0.83 07/18/2021 0932   CREATININE 0.99 08/29/2016 1130      Component Value Date/Time   CALCIUM 9.5 07/18/2021 0932   ALKPHOS 76 07/18/2021 0932   AST 15 07/18/2021 0932   ALT 18 07/18/2021 0932   BILITOT 0.3 07/18/2021 0932       RADIOGRAPHIC STUDIES:  No results found.   ASSESSMENT/PLAN:  This is a very pleasant 70 year old Caucasian female diagnosed with stage IV (T2 a, N2, M1 B) non-small cell lung cancer, adenocarcinoma.  She presented with a left upper lobe lung mass in addition to left hilar and precarinal metastatic adenopathy as well as a small left lower lobe pulmonary nodule and a solitary left frontal brain metastasis.  This was diagnosed in July 2022.  Her molecular studies show that she is positive for K-ras G12C mutation and her PD-L1 expression is negative.  She completed SRS to the solitary brain metastasis under the care of Dr. Tammi Klippel which was completed on 06/23/2021.  She is currently undergoing concurrent chemoradiation for the locally advanced disease in the chest with carboplatin for an AUC of 2 and paclitaxel 45 mg per metered squared.  She is status post 4 cycles.  She has been tolerating treatment well without any concerning adverse side effects.  Labs reviewed.  Recommend that she proceed cycle #5 today's schedule.  Next week will be her last week of chemotherapy.   I will arrange for restaging CT scan of the chest on 08/22/2021.  We will see her back a few days later for evaluation to review her scan  results.  The patient is strongly encouraged to quit smoking.  She was advised to use Mucinex if needed for her mucus production.  If she needs to use a cough medication, advised that she use Delsym or Robitussin.  The patient will continue her course of antibiotics for her dental abscess.  Recommend that she follow-up with her dentist.  The patient knows to call she develops any systemic symptoms such as fever, chills, increased pain, swelling, ect.   The patient was advised to call immediately if she has any concerning symptoms in the interval. The patient voices understanding of current disease status and treatment options and is in agreement with the current care plan. All questions were answered. The patient knows to call the clinic with any problems, questions or concerns. We can certainly see the patient much sooner if necessary       Orders Placed This Encounter  Procedures   CT Chest W Contrast    Standing Status:   Future    Standing Expiration Date:   07/18/2022    Order Specific Question:   If indicated for the ordered procedure, I authorize the administration of contrast media per Radiology protocol    Answer:   Yes    Order Specific Question:   Preferred imaging location?    Answer:   [facility]     The total time spent in the appointment was 20-29 minutes  Ajit Errico L Meira Wahba, PA-C 07/18/21

## 2021-07-14 ENCOUNTER — Other Ambulatory Visit: Payer: Self-pay | Admitting: Medical Oncology

## 2021-07-14 ENCOUNTER — Ambulatory Visit
Admission: RE | Admit: 2021-07-14 | Discharge: 2021-07-14 | Disposition: A | Discharge status: Home / Self Care | Payer: Medicare HMO | Source: Ambulatory Visit | Attending: Radiation Oncology | Admitting: Radiation Oncology

## 2021-07-14 ENCOUNTER — Telehealth: Payer: Self-pay

## 2021-07-14 ENCOUNTER — Other Ambulatory Visit: Payer: Self-pay

## 2021-07-14 ENCOUNTER — Emergency Department (HOSPITAL_COMMUNITY)
Admission: EM | Admit: 2021-07-14 | Discharge: 2021-07-14 | Disposition: A | Discharge status: Home / Self Care | Payer: Medicare HMO | Attending: Emergency Medicine | Admitting: Emergency Medicine

## 2021-07-14 ENCOUNTER — Encounter (HOSPITAL_COMMUNITY): Payer: Self-pay | Admitting: Emergency Medicine

## 2021-07-14 DIAGNOSIS — Z85118 Personal history of other malignant neoplasm of bronchus and lung: Secondary | ICD-10-CM | POA: Diagnosis not present

## 2021-07-14 DIAGNOSIS — R69 Illness, unspecified: Secondary | ICD-10-CM | POA: Diagnosis not present

## 2021-07-14 DIAGNOSIS — I129 Hypertensive chronic kidney disease with stage 1 through stage 4 chronic kidney disease, or unspecified chronic kidney disease: Secondary | ICD-10-CM | POA: Diagnosis not present

## 2021-07-14 DIAGNOSIS — I2510 Atherosclerotic heart disease of native coronary artery without angina pectoris: Secondary | ICD-10-CM | POA: Diagnosis not present

## 2021-07-14 DIAGNOSIS — N189 Chronic kidney disease, unspecified: Secondary | ICD-10-CM | POA: Insufficient documentation

## 2021-07-14 DIAGNOSIS — K0889 Other specified disorders of teeth and supporting structures: Secondary | ICD-10-CM | POA: Diagnosis not present

## 2021-07-14 DIAGNOSIS — K047 Periapical abscess without sinus: Secondary | ICD-10-CM | POA: Diagnosis not present

## 2021-07-14 DIAGNOSIS — E039 Hypothyroidism, unspecified: Secondary | ICD-10-CM | POA: Insufficient documentation

## 2021-07-14 DIAGNOSIS — Z951 Presence of aortocoronary bypass graft: Secondary | ICD-10-CM | POA: Insufficient documentation

## 2021-07-14 DIAGNOSIS — F17210 Nicotine dependence, cigarettes, uncomplicated: Secondary | ICD-10-CM | POA: Diagnosis not present

## 2021-07-14 DIAGNOSIS — C3412 Malignant neoplasm of upper lobe, left bronchus or lung: Secondary | ICD-10-CM | POA: Diagnosis not present

## 2021-07-14 DIAGNOSIS — Z7982 Long term (current) use of aspirin: Secondary | ICD-10-CM | POA: Diagnosis not present

## 2021-07-14 DIAGNOSIS — Z79899 Other long term (current) drug therapy: Secondary | ICD-10-CM | POA: Insufficient documentation

## 2021-07-14 DIAGNOSIS — J449 Chronic obstructive pulmonary disease, unspecified: Secondary | ICD-10-CM | POA: Diagnosis not present

## 2021-07-14 MED ORDER — CLINDAMYCIN HCL 150 MG PO CAPS: 300.0000 mg | ORAL_CAPSULE | Freq: Three times a day (TID) | ORAL | 0 refills | Status: AC

## 2021-07-14 NOTE — Telephone Encounter (Signed)
Patient calls nurse line complaining of a tooth abscess. Patient reports a tooth started to hurt last night and she woke to a swollen jaw. Patent reports she is in a lot of pain and would like an antibiotic. Patient reports she has a Pharmacist, community, however in [place]. I scheduled her for tomorrow in [facility]. However, patient would prefer to be treated without coming in. Will forward to PCP.

## 2021-07-14 NOTE — ED Notes (Signed)
Pt ambulatory in ED lobby. 

## 2021-07-14 NOTE — ED Provider Notes (Signed)
[facility] DEPT Provider Note   CSN: [id number] Arrival date & time: 07/14/21  1438     History Chief Complaint  Patient presents with   Dental Pain    Jill [name] is a 70 y.o. female.  HPI Patient is a 70 year old female with a history of primary malignant neoplasm of the lung with metastasis to the brain currently on chemotherapy as well as radiation, COPD, hyperlipidemia, hypertension, tobacco abuse, who presents to the emergency department due to dental pain and swelling.  States her symptoms started yesterday.  States they are along the right upper portion of her mouth.  Denies any sore throat, difficulty swallowing, fevers, chills, nausea, vomiting, shortness of breath.  Reports a history of similar symptoms in the past.    Past Medical History:  Diagnosis Date   Anxiety    ON PAXIL, XANAX   AVM (arteriovenous malformation) brain    s/p stent/coil   Blood transfusion    x 2   Cancer (HCC)    Left lung   COPD (chronic obstructive pulmonary disease) (HCC)    no inhaler, no oxygen   Coronary artery disease    Prior inferior MI with stent to RCA, s/p CABG in 2008   Depression    Dizziness    Dyspnea    with exertion, no oxygen   Fatigue    Foot injury 06/01/2017   right - RESOLVED, no longer an issue per patient 05/18/21   GERD (gastroesophageal reflux disease)    Headache(784.0)    UNRUPTURED CEREBRAL ANEURYSM   Hyperlipidemia    Hypertension    Hypothyroidism    Infected cyst of skin 09/18/2013   Left knee pain 06/05/2012   Lung mass    Left lung   Myocardial infarction ([place])    Normal nuclear stress test Ju;y 2012   No ischemia. EF 70%; fixed defect involving septum, inferoseptal and inferior wall   Pain, dental 02/06/2017   Poor dental hygiene    Retroperitoneal bleeding    Following cardiac cath   Tobacco abuse    Urine discoloration 09/18/2013   UTI (lower urinary tract infection) 10/16/2013    Patient Active  Problem List   Diagnosis Date Noted   Primary malignant neoplasm of lung with metastasis to brain ([place]) 06/07/2021   Encounter for antineoplastic chemotherapy 06/06/2021   Primary adenocarcinoma of left upper lobe of lung ([facility]) 05/16/2021   History of aneurysm 04/12/2021   Lip lesion 08/06/2020   No-show for appointment 07/16/2020   Screening mammography declined 07/07/2020   CKD (chronic kidney disease) 07/07/2020   Depression 07/06/2020   Urine incontinence 07/06/2020   Dental caries 08/21/2018   H/O measles 03/19/2018   GERD (gastroesophageal reflux disease) 06/01/2017   Prediabetes 02/06/2017   COPD (chronic obstructive pulmonary disease) ([place]) 09/01/2016   Tobacco abuse 06/05/2012   Coronary artery disease    Hyperlipidemia    Hypertension    Hypothyroidism     Past Surgical History:  Procedure Laterality Date   CARDIAC CATHETERIZATION  01/02/2007   IT REVEALS MILD INFERIOR WALL HYPOKINESIS. THE EJECTION FRACTION IS AROUND 50%   COLONOSCOPY     CORONARY ARTERY BYPASS GRAFT  11/06/2006   LIMA to LAD, SVG to DX, SVG to LCX & SVG to OM 1 & 2, and SVG to PD   CORONARY STENT PLACEMENT     Remote past stent to RCA   EYE SURGERY Right    cataracts removed   HEMORRHOID SURGERY  11/07/1987   UPPER GI ENDOSCOPY     VENTRICULOSTOMY  10/06/2011   Procedure: VENTRICULOSTOMY;  Surgeon: Winfield Cunas;  Location: [facility];  Service: Neurosurgery;  Laterality: Right;  Insertion of Ventriculostomy Catheter   VIDEO BRONCHOSCOPY WITH ENDOBRONCHIAL NAVIGATION Left 05/20/2021   Procedure: VIDEO BRONCHOSCOPY WITH ENDOBRONCHIAL NAVIGATION;  Surgeon: Garner Nash, DO;  Location: [facility];  Service: Pulmonary;  Laterality: Left;   VIDEO BRONCHOSCOPY WITH ENDOBRONCHIAL ULTRASOUND Bilateral 05/20/2021   Procedure: VIDEO BRONCHOSCOPY WITH ENDOBRONCHIAL ULTRASOUND;  Surgeon: Garner Nash, DO;  Location: [facility];  Service: Pulmonary;  Laterality: Bilateral;   WISDOM TOOTH EXTRACTION        OB History   No obstetric history on file.     Family History  Problem Relation Age of Onset   Cancer Mother 27   Diabetes Mother    Alcohol abuse Father    Asthma Father    Diabetes Brother    Lung cancer Brother    Ovarian cancer Maternal Aunt    Liver cancer Paternal Aunt    Heart disease Neg Hx     Social History   Tobacco Use   Smoking status: Every Day    Packs/day: 1.50    Years: 58.00    Pack years: 87.00    Types: Cigarettes    Start date: 11/06/1957   Smokeless tobacco: Never   Tobacco comments:    Has smoked 3 ppd, current 1.5-2 ppd  Vaping Use   Vaping Use: Never used  Substance Use Topics   Alcohol use: No   Drug use: No    Home Medications Prior to Admission medications   Medication Sig Start Date End Date Taking? Authorizing Provider  amLODipine (NORVASC) 10 MG tablet Take 1 tablet by mouth once daily 06/06/21   Andrena Mews T, MD  aspirin EC 81 MG tablet Take 1 tablet (81 mg total) by mouth daily. 07/06/20   [name], MD  atorvastatin (LIPITOR) 40 MG tablet Take 1 tablet (40 mg total) by mouth daily. 05/24/21   [name], MD  Cholecalciferol (VITAMIN D3) 250 MCG (10000 UT) capsule Take 10,000 mcg by mouth daily.    [provider]  clindamycin (CLEOCIN) 150 MG capsule Take 2 capsules (300 mg total) by mouth 3 (three) times daily for 10 days. 07/14/21 07/24/21 Yes Rayna Sexton, PA-C  diphenhydramine-acetaminophen (TYLENOL PM) 25-500 MG TABS tablet Take 1 tablet by mouth every other day. At bedtime    [provider]  escitalopram (LEXAPRO) 10 MG tablet Take 1 tablet by mouth once daily 05/25/21   Andrena Mews T, MD  levothyroxine (SYNTHROID) 137 MCG tablet Take 1 tablet by mouth once daily 06/30/21   Andrena Mews T, MD  losartan (COZAAR) 100 MG tablet Take 1 tablet by mouth once daily 06/03/21   Andrena Mews T, MD  metoprolol succinate (TOPROL-XL) 50 MG 24 hr tablet TAKE 1 TABLET BY MOUTH ONCE DAILY.  TAKE   WITH  OR  IMMEDIATELY  FOLLOWING  A  MEAL. 07/19/20   McDiarmid, Blane Ohara, MD  Multiple Vitamins-Minerals (MULTIVITAMIN WITH MINERALS) tablet Take 1 tablet by mouth daily.    [provider]  nitroGLYCERIN (NITROSTAT) 0.4 MG SL tablet Place 1 tablet (0.4 mg total) under the tongue every 5 (five) minutes as needed. For chest pain Patient not taking: Reported on 06/06/2021 05/22/13   Nahser, Wonda Cheng, MD  Omega-3 Fatty Acids (FISH OIL) 1000 MG CAPS Take 2 capsules (2,000 mg total)  by mouth in the morning and at bedtime. 07/06/20   [name], MD  omeprazole (PRILOSEC) 20 MG capsule Take 1 capsule (20 mg total) by mouth daily. Prolonged use may damage the kidney 07/06/20   [name], MD  prochlorperazine (COMPAZINE) 10 MG tablet Take 1 tablet (10 mg total) by mouth every 6 (six) hours as needed for nausea or vomiting. 06/06/21   Curt Bears, MD  Vitamin A 2400 MCG (8000 UT) TABS Take 2,400 mg by mouth daily.    [provider]  vitamin B-12 (CYANOCOBALAMIN) 1000 MCG tablet Take 1,000 mcg by mouth daily.    [provider]    Allergies    Varenicline, Ace inhibitors, Prednisone, Penicillins, and Sulfa drugs cross reactors  Review of Systems   Review of Systems  Constitutional:  Negative for chills and fever.  HENT:  Positive for dental problem and facial swelling. Negative for sore throat and trouble swallowing.   Respiratory:  Negative for shortness of breath.   Gastrointestinal:  Negative for nausea and vomiting.   Physical Exam Updated Vital Signs BP 123/71 (BP Location: Left Arm)   Pulse 64   Temp 98.7 F (37.1 C) (Oral)   Resp 19   SpO2 95%   Physical Exam Vitals and nursing note reviewed.  Constitutional:      General: Jill [name] is not in acute distress.    Appearance: Jill [name] is well-developed.  HENT:     Head: Normocephalic and atraumatic.     Right Ear: External ear normal.     Left Ear: External ear normal.     Mouth/Throat:     Mouth: Mucous  membranes are moist.     Pharynx: Oropharynx is clear. No oropharyngeal exudate or posterior oropharyngeal erythema.     Comments: Generally poor dentition with multiple missing teeth, fractured teeth, as well as diffuse dental caries.  Moderate tenderness appreciated along the gingiva surrounding the right upper first molar.  Broken tooth in the region.  No palpable fluctuance noted.  Mild facial swelling along the right cheek.  Uvula midline.  No erythema noted in the posterior oropharynx.  Readily handling secretions.  Speaking clearly and coherently.  No stridor. Eyes:     General: No scleral icterus.       Right eye: No discharge.        Left eye: No discharge.     Conjunctiva/sclera: Conjunctivae normal.  Neck:     Trachea: No tracheal deviation.  Cardiovascular:     Rate and Rhythm: Normal rate.  Pulmonary:     Effort: Pulmonary effort is normal. No respiratory distress.     Breath sounds: No stridor.  Abdominal:     General: There is no distension.  Musculoskeletal:        General: No swelling or deformity.     Cervical back: Neck supple.  Skin:    General: Skin is warm and dry.     Findings: No rash.  Neurological:     General: No focal deficit present.     Mental Status: Jill [name] is alert and oriented to person, place, and time.     Cranial Nerves: Cranial nerve deficit: no gross deficits.    ED Results / Procedures / Treatments   Labs (all labs ordered are listed, but only abnormal results are displayed) Labs Reviewed - No data to display  EKG None  Radiology No results found.  Procedures Procedures   Medications Ordered in ED Medications - No data to display  ED Course  I have reviewed the triage vital signs and the nursing notes.  Pertinent labs & imaging results that were available during my care of the patient were reviewed by me and considered in my medical decision making (see chart for details).    MDM Rules/Calculators/A&P                           Patient is a 70 year old female who presents to the emergency department due to a developing dental abscess in the right upper portion of her mouth.  Patient has tenderness along the gingiva in the right upper portion of the mouth surrounding the first molar.  No palpable fluctuance or region that appears amenable to I&D.  Uvula midline and patient is readily handling secretions.  Soft compartments.  Doubt Ludwig's angina or PTA at this time.  Denies any systemic symptoms such as fevers, chills, nausea, or vomiting.  Afebrile and not tachycardic.  Feel that the patient is stable for discharge at this time and Jill [name] is agreeable.  Jill [name] has an allergy to both penicillins as well as sulfa drugs so will discharge on clindamycin.  Recommended follow-up with her oncologist as needed.  Return to the emergency department with worsening symptoms.  Her questions were answered and Jill [name] was amicable at the time of discharge.  Final Clinical Impression(s) / ED Diagnoses Final diagnoses:  Dental abscess   Rx / DC Orders ED Discharge Orders          Ordered    clindamycin (CLEOCIN) 150 MG capsule  3 times daily        07/14/21 1539             Rayna Sexton, PA-C 07/14/21 [place], [place], DO 07/14/21 2345

## 2021-07-14 NOTE — Discharge Instructions (Addendum)
I am prescribing you an antibiotic called clindamycin.  Please take this 3 times a day for the next 10 days.  Try to take this medication with a small amount of food to prevent stomach irritation.  If you develop any new or worsening symptoms such as fevers, chills, nausea, vomiting, worsening pain/swelling, difficulty swallowing, please come back to the emergency department.  It was a pleasure to meet you.

## 2021-07-14 NOTE — ED Triage Notes (Signed)
Per pt, states upper right dental pain, currently on chemo

## 2021-07-15 ENCOUNTER — Ambulatory Visit
Admission: RE | Admit: 2021-07-15 | Discharge: 2021-07-15 | Disposition: A | Discharge status: Home / Self Care | Payer: Medicare HMO | Source: Ambulatory Visit | Attending: Radiation Oncology | Admitting: Radiation Oncology

## 2021-07-15 ENCOUNTER — Ambulatory Visit: Payer: Medicare HMO

## 2021-07-15 DIAGNOSIS — C3490 Malignant neoplasm of unspecified part of unspecified bronchus or lung: Secondary | ICD-10-CM | POA: Diagnosis not present

## 2021-07-15 DIAGNOSIS — C3412 Malignant neoplasm of upper lobe, left bronchus or lung: Secondary | ICD-10-CM | POA: Diagnosis not present

## 2021-07-15 DIAGNOSIS — C7931 Secondary malignant neoplasm of brain: Secondary | ICD-10-CM | POA: Diagnosis not present

## 2021-07-15 DIAGNOSIS — Z510 Encounter for antineoplastic radiation therapy: Secondary | ICD-10-CM | POA: Diagnosis not present

## 2021-07-15 MED FILL — Dexamethasone Sodium Phosphate Inj 100 MG/10ML: INTRAMUSCULAR | Qty: 1 | Status: AC

## 2021-07-15 NOTE — Telephone Encounter (Signed)
Spoke with patient she informed me that she went to the Urgent Care yesterday afternoon and they put her on an antibiotic. I advised that if she has any concerns to call the office and make an appointment. Salvatore Marvel, CMA

## 2021-07-18 ENCOUNTER — Ambulatory Visit
Admission: RE | Admit: 2021-07-18 | Discharge: 2021-07-18 | Disposition: A | Discharge status: Home / Self Care | Payer: Medicare HMO | Source: Ambulatory Visit | Attending: Radiation Oncology | Admitting: Radiation Oncology

## 2021-07-18 ENCOUNTER — Inpatient Hospital Stay (HOSPITAL_BASED_OUTPATIENT_CLINIC_OR_DEPARTMENT_OTHER): Payer: Medicare HMO | Admitting: Physician Assistant

## 2021-07-18 ENCOUNTER — Other Ambulatory Visit: Payer: Self-pay

## 2021-07-18 ENCOUNTER — Inpatient Hospital Stay: Payer: Medicare HMO

## 2021-07-18 VITALS — BP 115/71 | HR 67 | Temp 96.4°F | Resp 17 | Wt 136.1 lb

## 2021-07-18 DIAGNOSIS — R058 Other specified cough: Secondary | ICD-10-CM | POA: Diagnosis not present

## 2021-07-18 DIAGNOSIS — C3412 Malignant neoplasm of upper lobe, left bronchus or lung: Secondary | ICD-10-CM

## 2021-07-18 DIAGNOSIS — J449 Chronic obstructive pulmonary disease, unspecified: Secondary | ICD-10-CM | POA: Diagnosis not present

## 2021-07-18 DIAGNOSIS — C7931 Secondary malignant neoplasm of brain: Secondary | ICD-10-CM | POA: Diagnosis not present

## 2021-07-18 DIAGNOSIS — C3490 Malignant neoplasm of unspecified part of unspecified bronchus or lung: Secondary | ICD-10-CM

## 2021-07-18 DIAGNOSIS — Z5111 Encounter for antineoplastic chemotherapy: Secondary | ICD-10-CM

## 2021-07-18 DIAGNOSIS — E039 Hypothyroidism, unspecified: Secondary | ICD-10-CM | POA: Diagnosis not present

## 2021-07-18 DIAGNOSIS — K047 Periapical abscess without sinus: Secondary | ICD-10-CM | POA: Diagnosis not present

## 2021-07-18 DIAGNOSIS — Z510 Encounter for antineoplastic radiation therapy: Secondary | ICD-10-CM | POA: Diagnosis not present

## 2021-07-18 DIAGNOSIS — K5900 Constipation, unspecified: Secondary | ICD-10-CM | POA: Diagnosis not present

## 2021-07-18 DIAGNOSIS — C779 Secondary and unspecified malignant neoplasm of lymph node, unspecified: Secondary | ICD-10-CM | POA: Diagnosis not present

## 2021-07-18 DIAGNOSIS — E785 Hyperlipidemia, unspecified: Secondary | ICD-10-CM | POA: Diagnosis not present

## 2021-07-18 LAB — CMP (CANCER CENTER ONLY)
ALT: 18 U/L (ref 0–44)
AST: 15 U/L (ref 15–41)
Albumin: 3.3 g/dL — ABNORMAL LOW (ref 3.5–5.0)
Alkaline Phosphatase: 76 U/L (ref 38–126)
Anion gap: 8 (ref 5–15)
BUN: 20 mg/dL (ref 8–23)
CO2: 25 mmol/L (ref 22–32)
Calcium: 9.5 mg/dL (ref 8.9–10.3)
Chloride: 106 mmol/L (ref 98–111)
Creatinine: 0.83 mg/dL (ref 0.44–1.00)
GFR, Estimated: >60 mL/min (ref >60)
Glucose, Bld: 88 mg/dL (ref 70–99)
Potassium: 4.4 mmol/L (ref 3.5–5.1)
Sodium: 139 mmol/L (ref 135–145)
Total Bilirubin: 0.3 mg/dL (ref 0.3–1.2)
Total Protein: 6.4 g/dL — ABNORMAL LOW (ref 6.5–8.1)

## 2021-07-18 LAB — CBC WITH DIFFERENTIAL (CANCER CENTER ONLY)
Abs Immature Granulocytes: 0.03 10*3/uL (ref 0.00–0.07)
Basophils Absolute: 0.1 10*3/uL (ref 0.0–0.1)
Basophils Relative: 1 %
Eosinophils Absolute: 0.1 10*3/uL (ref 0.0–0.5)
Eosinophils Relative: 3 %
HCT: 33.1 % — ABNORMAL LOW (ref 36.0–46.0)
Hemoglobin: 11.2 g/dL — ABNORMAL LOW (ref 12.0–15.0)
Immature Granulocytes: 1 %
Lymphocytes Relative: 18 %
Lymphs Abs: 0.8 10*3/uL (ref 0.7–4.0)
MCH: 32.4 pg (ref 26.0–34.0)
MCHC: 33.8 g/dL (ref 30.0–36.0)
MCV: 95.7 fL (ref 80.0–100.0)
Monocytes Absolute: 0.4 10*3/uL (ref 0.1–1.0)
Monocytes Relative: 9 %
Neutro Abs: 3.1 10*3/uL (ref 1.7–7.7)
Neutrophils Relative %: 68 %
Platelet Count: 202 10*3/uL (ref 150–400)
RBC: 3.46 MIL/uL — ABNORMAL LOW (ref 3.87–5.11)
RDW: 14 % (ref 11.5–15.5)
WBC Count: 4.5 10*3/uL (ref 4.0–10.5)
nRBC: 0 % (ref 0.0–0.2)

## 2021-07-18 MED ORDER — DIPHENHYDRAMINE HCL 50 MG/ML IJ SOLN
50.0000 mg | Freq: Once | INTRAMUSCULAR | Status: AC
  Administered 2021-07-18: 50 mg via INTRAVENOUS
  Filled 2021-07-18: qty 1

## 2021-07-18 MED ORDER — PALONOSETRON HCL INJECTION 0.25 MG/5ML
0.2500 mg | Freq: Once | INTRAVENOUS | Status: AC
  Administered 2021-07-18: 0.25 mg via INTRAVENOUS
  Filled 2021-07-18: qty 5

## 2021-07-18 MED ORDER — SODIUM CHLORIDE 0.9 % IV SOLN
10.0000 mg | Freq: Once | INTRAVENOUS | Status: AC
  Administered 2021-07-18: 10 mg via INTRAVENOUS
  Filled 2021-07-18: qty 10

## 2021-07-18 MED ORDER — SODIUM CHLORIDE 0.9 % IV SOLN
45.0000 mg/m2 | Freq: Once | INTRAVENOUS | Status: AC
  Administered 2021-07-18: 78 mg via INTRAVENOUS
  Filled 2021-07-18: qty 13

## 2021-07-18 MED ORDER — SODIUM CHLORIDE 0.9 % IV SOLN: Freq: Once | INTRAVENOUS | Status: AC

## 2021-07-18 MED ORDER — SODIUM CHLORIDE 0.9 % IV SOLN
136.6000 mg | Freq: Once | INTRAVENOUS | Status: AC
  Administered 2021-07-18: 140 mg via INTRAVENOUS
  Filled 2021-07-18: qty 14

## 2021-07-18 MED ORDER — FAMOTIDINE 20 MG IN NS 100 ML IVPB
20.0000 mg | Freq: Once | INTRAVENOUS | Status: AC
  Administered 2021-07-18: 20 mg via INTRAVENOUS
  Filled 2021-07-18: qty 100

## 2021-07-18 NOTE — Patient Instructions (Signed)
[facility]  Discharge Instructions: Thank you for choosing [facility] to provide your oncology and hematology care.   If you have a lab appointment with the [facility], please go directly to the [facility] and check in at the registration area.   Wear comfortable clothing and clothing appropriate for easy access to any Portacath or PICC line.   We strive to give you quality time with your provider. You may need to reschedule your appointment if you arrive late (15 or more minutes).  Arriving late affects you and other patients whose appointments are after yours.  Also, if you miss three or more appointments without notifying the office, you may be dismissed from the clinic at the provider's discretion.      For prescription refill requests, have your pharmacy contact our office and allow 72 hours for refills to be completed.    Today you received the following chemotherapy and/or immunotherapy agents Carboplatin and Taxol      To help prevent nausea and vomiting after your treatment, we encourage you to take your nausea medication as directed.  BELOW ARE SYMPTOMS THAT SHOULD BE REPORTED IMMEDIATELY: *FEVER GREATER THAN 100.4 F (38 C) OR HIGHER *CHILLS OR SWEATING *NAUSEA AND VOMITING THAT IS NOT CONTROLLED WITH YOUR NAUSEA MEDICATION *UNUSUAL SHORTNESS OF BREATH *UNUSUAL BRUISING OR BLEEDING *URINARY PROBLEMS (pain or burning when urinating, or frequent urination) *BOWEL PROBLEMS (unusual diarrhea, constipation, pain near the anus) TENDERNESS IN MOUTH AND THROAT WITH OR WITHOUT PRESENCE OF ULCERS (sore throat, sores in mouth, or a toothache) UNUSUAL RASH, SWELLING OR PAIN  UNUSUAL VAGINAL DISCHARGE OR ITCHING   Items with * indicate a potential emergency and should be followed up as soon as possible or go to the Emergency Department if any problems should occur.  Please show the CHEMOTHERAPY ALERT CARD or IMMUNOTHERAPY ALERT CARD at  check-in to the Emergency Department and triage nurse.  Should you have questions after your visit or need to cancel or reschedule your appointment, please contact [facility]  Dept: [phone]  and follow the prompts.  Office hours are 8:00 a.m. to 4:30 p.m. Monday - Friday. Please note that voicemails left after 4:00 p.m. may not be returned until the following business day.  We are closed weekends and major holidays. You have access to a nurse at all times for urgent questions. Please call the main number to the clinic Dept: [phone] and follow the prompts.   For any non-urgent questions, you may also contact your provider using MyChart. We now offer e-Visits for anyone 47 and older to request care online for non-urgent symptoms. For details visit [email or web address].   Also download the MyChart app! Go to the app store, search "MyChart", open the app, select [facility], and log in with your MyChart username and password.  Due to Covid, a mask is required upon entering the hospital/clinic. If you do not have a mask, one will be given to you upon arrival. For doctor visits, patients may have 1 support person aged 38 or older with them. For treatment visits, patients cannot have anyone with them due to current Covid guidelines and our immunocompromised population.

## 2021-07-19 ENCOUNTER — Ambulatory Visit
Admission: RE | Admit: 2021-07-19 | Discharge: 2021-07-19 | Disposition: A | Discharge status: Home / Self Care | Payer: Medicare HMO | Source: Ambulatory Visit | Attending: Radiation Oncology | Admitting: Radiation Oncology

## 2021-07-19 DIAGNOSIS — C3412 Malignant neoplasm of upper lobe, left bronchus or lung: Secondary | ICD-10-CM | POA: Diagnosis not present

## 2021-07-19 DIAGNOSIS — C3490 Malignant neoplasm of unspecified part of unspecified bronchus or lung: Secondary | ICD-10-CM | POA: Diagnosis not present

## 2021-07-19 DIAGNOSIS — Z510 Encounter for antineoplastic radiation therapy: Secondary | ICD-10-CM | POA: Diagnosis not present

## 2021-07-19 DIAGNOSIS — C7931 Secondary malignant neoplasm of brain: Secondary | ICD-10-CM | POA: Diagnosis not present

## 2021-07-20 ENCOUNTER — Other Ambulatory Visit: Payer: Self-pay

## 2021-07-20 ENCOUNTER — Ambulatory Visit
Admission: RE | Admit: 2021-07-20 | Discharge: 2021-07-20 | Disposition: A | Discharge status: Home / Self Care | Payer: Medicare HMO | Source: Ambulatory Visit | Attending: Radiation Oncology | Admitting: Radiation Oncology

## 2021-07-20 DIAGNOSIS — C3490 Malignant neoplasm of unspecified part of unspecified bronchus or lung: Secondary | ICD-10-CM | POA: Diagnosis not present

## 2021-07-20 DIAGNOSIS — Z510 Encounter for antineoplastic radiation therapy: Secondary | ICD-10-CM | POA: Diagnosis not present

## 2021-07-20 DIAGNOSIS — C3412 Malignant neoplasm of upper lobe, left bronchus or lung: Secondary | ICD-10-CM | POA: Diagnosis not present

## 2021-07-20 DIAGNOSIS — C7931 Secondary malignant neoplasm of brain: Secondary | ICD-10-CM | POA: Diagnosis not present

## 2021-07-21 ENCOUNTER — Ambulatory Visit
Admission: RE | Admit: 2021-07-21 | Discharge: 2021-07-21 | Disposition: A | Discharge status: Home / Self Care | Payer: Medicare HMO | Source: Ambulatory Visit | Attending: Radiation Oncology | Admitting: Radiation Oncology

## 2021-07-21 DIAGNOSIS — C3412 Malignant neoplasm of upper lobe, left bronchus or lung: Secondary | ICD-10-CM | POA: Diagnosis not present

## 2021-07-21 DIAGNOSIS — C3490 Malignant neoplasm of unspecified part of unspecified bronchus or lung: Secondary | ICD-10-CM | POA: Diagnosis not present

## 2021-07-21 DIAGNOSIS — Z510 Encounter for antineoplastic radiation therapy: Secondary | ICD-10-CM | POA: Diagnosis not present

## 2021-07-21 DIAGNOSIS — C7931 Secondary malignant neoplasm of brain: Secondary | ICD-10-CM | POA: Diagnosis not present

## 2021-07-22 ENCOUNTER — Other Ambulatory Visit: Payer: Self-pay

## 2021-07-22 ENCOUNTER — Ambulatory Visit
Admission: RE | Admit: 2021-07-22 | Discharge: 2021-07-22 | Disposition: A | Discharge status: Home / Self Care | Payer: Medicare HMO | Source: Ambulatory Visit | Attending: Radiation Oncology | Admitting: Radiation Oncology

## 2021-07-22 DIAGNOSIS — C3412 Malignant neoplasm of upper lobe, left bronchus or lung: Secondary | ICD-10-CM | POA: Diagnosis not present

## 2021-07-22 DIAGNOSIS — C3490 Malignant neoplasm of unspecified part of unspecified bronchus or lung: Secondary | ICD-10-CM | POA: Diagnosis not present

## 2021-07-22 DIAGNOSIS — C7931 Secondary malignant neoplasm of brain: Secondary | ICD-10-CM | POA: Diagnosis not present

## 2021-07-22 DIAGNOSIS — Z510 Encounter for antineoplastic radiation therapy: Secondary | ICD-10-CM | POA: Diagnosis not present

## 2021-07-22 MED FILL — Dexamethasone Sodium Phosphate Inj 100 MG/10ML: INTRAMUSCULAR | Qty: 1 | Status: AC

## 2021-07-25 ENCOUNTER — Ambulatory Visit
Admission: RE | Admit: 2021-07-25 | Discharge: 2021-07-25 | Disposition: A | Discharge status: Home / Self Care | Payer: Medicare HMO | Source: Ambulatory Visit | Attending: Radiation Oncology | Admitting: Radiation Oncology

## 2021-07-25 ENCOUNTER — Other Ambulatory Visit: Payer: Self-pay

## 2021-07-25 ENCOUNTER — Inpatient Hospital Stay: Payer: Medicare HMO

## 2021-07-25 VITALS — BP 124/65 | HR 71 | Temp 98.3°F | Resp 17 | Wt 135.5 lb

## 2021-07-25 DIAGNOSIS — C7931 Secondary malignant neoplasm of brain: Secondary | ICD-10-CM | POA: Diagnosis not present

## 2021-07-25 DIAGNOSIS — E785 Hyperlipidemia, unspecified: Secondary | ICD-10-CM | POA: Diagnosis not present

## 2021-07-25 DIAGNOSIS — E039 Hypothyroidism, unspecified: Secondary | ICD-10-CM | POA: Diagnosis not present

## 2021-07-25 DIAGNOSIS — R058 Other specified cough: Secondary | ICD-10-CM | POA: Diagnosis not present

## 2021-07-25 DIAGNOSIS — K047 Periapical abscess without sinus: Secondary | ICD-10-CM | POA: Diagnosis not present

## 2021-07-25 DIAGNOSIS — Z5111 Encounter for antineoplastic chemotherapy: Secondary | ICD-10-CM | POA: Diagnosis not present

## 2021-07-25 DIAGNOSIS — C3412 Malignant neoplasm of upper lobe, left bronchus or lung: Secondary | ICD-10-CM

## 2021-07-25 DIAGNOSIS — C779 Secondary and unspecified malignant neoplasm of lymph node, unspecified: Secondary | ICD-10-CM | POA: Diagnosis not present

## 2021-07-25 DIAGNOSIS — J449 Chronic obstructive pulmonary disease, unspecified: Secondary | ICD-10-CM | POA: Diagnosis not present

## 2021-07-25 DIAGNOSIS — C3490 Malignant neoplasm of unspecified part of unspecified bronchus or lung: Secondary | ICD-10-CM | POA: Diagnosis not present

## 2021-07-25 DIAGNOSIS — Z510 Encounter for antineoplastic radiation therapy: Secondary | ICD-10-CM | POA: Diagnosis not present

## 2021-07-25 DIAGNOSIS — K5900 Constipation, unspecified: Secondary | ICD-10-CM | POA: Diagnosis not present

## 2021-07-25 LAB — CMP (CANCER CENTER ONLY)
ALT: 15 U/L (ref 0–44)
AST: 15 U/L (ref 15–41)
Albumin: 3.5 g/dL (ref 3.5–5.0)
Alkaline Phosphatase: 63 U/L (ref 38–126)
Anion gap: 10 (ref 5–15)
BUN: 18 mg/dL (ref 8–23)
CO2: 25 mmol/L (ref 22–32)
Calcium: 9.4 mg/dL (ref 8.9–10.3)
Chloride: 106 mmol/L (ref 98–111)
Creatinine: 0.84 mg/dL (ref 0.44–1.00)
GFR, Estimated: >60 mL/min (ref >60)
Glucose, Bld: 93 mg/dL (ref 70–99)
Potassium: 4.1 mmol/L (ref 3.5–5.1)
Sodium: 141 mmol/L (ref 135–145)
Total Bilirubin: 0.3 mg/dL (ref 0.3–1.2)
Total Protein: 6.5 g/dL (ref 6.5–8.1)

## 2021-07-25 LAB — CBC WITH DIFFERENTIAL (CANCER CENTER ONLY)
Abs Immature Granulocytes: 0 10*3/uL (ref 0.00–0.07)
Basophils Absolute: 0 10*3/uL (ref 0.0–0.1)
Basophils Relative: 1 %
Eosinophils Absolute: 0.2 10*3/uL (ref 0.0–0.5)
Eosinophils Relative: 5 %
HCT: 32.9 % — ABNORMAL LOW (ref 36.0–46.0)
Hemoglobin: 11 g/dL — ABNORMAL LOW (ref 12.0–15.0)
Immature Granulocytes: 0 %
Lymphocytes Relative: 21 %
Lymphs Abs: 0.7 10*3/uL (ref 0.7–4.0)
MCH: 32.5 pg (ref 26.0–34.0)
MCHC: 33.4 g/dL (ref 30.0–36.0)
MCV: 97.3 fL (ref 80.0–100.0)
Monocytes Absolute: 0.3 10*3/uL (ref 0.1–1.0)
Monocytes Relative: 8 %
Neutro Abs: 2.3 10*3/uL (ref 1.7–7.7)
Neutrophils Relative %: 65 %
Platelet Count: 189 10*3/uL (ref 150–400)
RBC: 3.38 MIL/uL — ABNORMAL LOW (ref 3.87–5.11)
RDW: 14.5 % (ref 11.5–15.5)
WBC Count: 3.5 10*3/uL — ABNORMAL LOW (ref 4.0–10.5)
nRBC: 0 % (ref 0.0–0.2)

## 2021-07-25 MED ORDER — DIPHENHYDRAMINE HCL 50 MG/ML IJ SOLN
50.0000 mg | Freq: Once | INTRAMUSCULAR | Status: AC
  Administered 2021-07-25: 50 mg via INTRAVENOUS
  Filled 2021-07-25: qty 1

## 2021-07-25 MED ORDER — SODIUM CHLORIDE 0.9 % IV SOLN: Freq: Once | INTRAVENOUS | Status: AC

## 2021-07-25 MED ORDER — PALONOSETRON HCL INJECTION 0.25 MG/5ML
0.2500 mg | Freq: Once | INTRAVENOUS | Status: AC
  Administered 2021-07-25: 0.25 mg via INTRAVENOUS
  Filled 2021-07-25: qty 5

## 2021-07-25 MED ORDER — SODIUM CHLORIDE 0.9 % IV SOLN
10.0000 mg | Freq: Once | INTRAVENOUS | Status: AC
  Administered 2021-07-25: 10 mg via INTRAVENOUS
  Filled 2021-07-25: qty 10

## 2021-07-25 MED ORDER — SODIUM CHLORIDE 0.9 % IV SOLN
136.6000 mg | Freq: Once | INTRAVENOUS | Status: AC
  Administered 2021-07-25: 140 mg via INTRAVENOUS
  Filled 2021-07-25: qty 14

## 2021-07-25 MED ORDER — FAMOTIDINE 20 MG IN NS 100 ML IVPB
20.0000 mg | Freq: Once | INTRAVENOUS | Status: AC
  Administered 2021-07-25: 20 mg via INTRAVENOUS
  Filled 2021-07-25: qty 100

## 2021-07-25 MED ORDER — SODIUM CHLORIDE 0.9 % IV SOLN
45.0000 mg/m2 | Freq: Once | INTRAVENOUS | Status: AC
  Administered 2021-07-25: 78 mg via INTRAVENOUS
  Filled 2021-07-25: qty 13

## 2021-07-26 ENCOUNTER — Ambulatory Visit
Admission: RE | Admit: 2021-07-26 | Discharge: 2021-07-26 | Disposition: A | Discharge status: Home / Self Care | Payer: Medicare HMO | Source: Ambulatory Visit | Attending: Radiation Oncology | Admitting: Radiation Oncology

## 2021-07-26 DIAGNOSIS — C3412 Malignant neoplasm of upper lobe, left bronchus or lung: Secondary | ICD-10-CM | POA: Diagnosis not present

## 2021-07-26 DIAGNOSIS — C3490 Malignant neoplasm of unspecified part of unspecified bronchus or lung: Secondary | ICD-10-CM | POA: Diagnosis not present

## 2021-07-26 DIAGNOSIS — Z510 Encounter for antineoplastic radiation therapy: Secondary | ICD-10-CM | POA: Diagnosis not present

## 2021-07-26 DIAGNOSIS — C7931 Secondary malignant neoplasm of brain: Secondary | ICD-10-CM | POA: Diagnosis not present

## 2021-07-27 ENCOUNTER — Ambulatory Visit
Admission: RE | Admit: 2021-07-27 | Discharge: 2021-07-27 | Disposition: A | Discharge status: Home / Self Care | Payer: Medicare HMO | Source: Ambulatory Visit | Attending: Radiation Oncology | Admitting: Radiation Oncology

## 2021-07-27 ENCOUNTER — Other Ambulatory Visit: Payer: Self-pay

## 2021-07-27 DIAGNOSIS — C7931 Secondary malignant neoplasm of brain: Secondary | ICD-10-CM | POA: Diagnosis not present

## 2021-07-27 DIAGNOSIS — C3412 Malignant neoplasm of upper lobe, left bronchus or lung: Secondary | ICD-10-CM | POA: Diagnosis not present

## 2021-07-27 DIAGNOSIS — Z510 Encounter for antineoplastic radiation therapy: Secondary | ICD-10-CM | POA: Diagnosis not present

## 2021-07-27 DIAGNOSIS — C3490 Malignant neoplasm of unspecified part of unspecified bronchus or lung: Secondary | ICD-10-CM | POA: Diagnosis not present

## 2021-07-28 ENCOUNTER — Ambulatory Visit
Admission: RE | Admit: 2021-07-28 | Discharge: 2021-07-28 | Disposition: A | Discharge status: Home / Self Care | Payer: Medicare HMO | Source: Ambulatory Visit | Attending: Radiation Oncology | Admitting: Radiation Oncology

## 2021-07-28 DIAGNOSIS — C7931 Secondary malignant neoplasm of brain: Secondary | ICD-10-CM | POA: Diagnosis not present

## 2021-07-28 DIAGNOSIS — Z510 Encounter for antineoplastic radiation therapy: Secondary | ICD-10-CM | POA: Diagnosis not present

## 2021-07-28 DIAGNOSIS — C3412 Malignant neoplasm of upper lobe, left bronchus or lung: Secondary | ICD-10-CM | POA: Diagnosis not present

## 2021-07-28 DIAGNOSIS — C3490 Malignant neoplasm of unspecified part of unspecified bronchus or lung: Secondary | ICD-10-CM | POA: Diagnosis not present

## 2021-07-29 ENCOUNTER — Ambulatory Visit
Admission: RE | Admit: 2021-07-29 | Discharge: 2021-07-29 | Disposition: A | Discharge status: Home / Self Care | Payer: Medicare HMO | Source: Ambulatory Visit | Attending: Radiation Oncology | Admitting: Radiation Oncology

## 2021-07-29 ENCOUNTER — Other Ambulatory Visit: Payer: Self-pay

## 2021-07-29 DIAGNOSIS — C3490 Malignant neoplasm of unspecified part of unspecified bronchus or lung: Secondary | ICD-10-CM | POA: Diagnosis not present

## 2021-07-29 DIAGNOSIS — C3412 Malignant neoplasm of upper lobe, left bronchus or lung: Secondary | ICD-10-CM | POA: Diagnosis not present

## 2021-07-29 DIAGNOSIS — C7931 Secondary malignant neoplasm of brain: Secondary | ICD-10-CM | POA: Diagnosis not present

## 2021-07-29 DIAGNOSIS — Z510 Encounter for antineoplastic radiation therapy: Secondary | ICD-10-CM | POA: Diagnosis not present

## 2021-08-01 ENCOUNTER — Encounter: Payer: Self-pay | Admitting: Radiation Oncology

## 2021-08-01 ENCOUNTER — Ambulatory Visit
Admission: RE | Admit: 2021-08-01 | Discharge: 2021-08-01 | Disposition: A | Discharge status: Home / Self Care | Payer: Medicare HMO | Source: Ambulatory Visit | Attending: Radiation Oncology | Admitting: Radiation Oncology

## 2021-08-01 ENCOUNTER — Other Ambulatory Visit: Payer: Self-pay

## 2021-08-01 DIAGNOSIS — C7931 Secondary malignant neoplasm of brain: Secondary | ICD-10-CM | POA: Diagnosis not present

## 2021-08-01 DIAGNOSIS — Z510 Encounter for antineoplastic radiation therapy: Secondary | ICD-10-CM | POA: Diagnosis not present

## 2021-08-01 DIAGNOSIS — C3490 Malignant neoplasm of unspecified part of unspecified bronchus or lung: Secondary | ICD-10-CM | POA: Diagnosis not present

## 2021-08-01 DIAGNOSIS — C3412 Malignant neoplasm of upper lobe, left bronchus or lung: Secondary | ICD-10-CM | POA: Diagnosis not present

## 2021-08-22 ENCOUNTER — Other Ambulatory Visit: Payer: Self-pay

## 2021-08-22 ENCOUNTER — Ambulatory Visit (HOSPITAL_COMMUNITY)
Admission: RE | Admit: 2021-08-22 | Discharge: 2021-08-22 | Disposition: A | Discharge status: Home / Self Care | Payer: Medicare HMO | Source: Ambulatory Visit | Attending: Physician Assistant | Admitting: Physician Assistant

## 2021-08-22 DIAGNOSIS — C7931 Secondary malignant neoplasm of brain: Secondary | ICD-10-CM | POA: Diagnosis not present

## 2021-08-22 DIAGNOSIS — C3490 Malignant neoplasm of unspecified part of unspecified bronchus or lung: Secondary | ICD-10-CM | POA: Insufficient documentation

## 2021-08-22 DIAGNOSIS — I700 Atherosclerosis of aorta: Secondary | ICD-10-CM | POA: Diagnosis not present

## 2021-08-22 DIAGNOSIS — R911 Solitary pulmonary nodule: Secondary | ICD-10-CM | POA: Diagnosis not present

## 2021-08-22 DIAGNOSIS — J439 Emphysema, unspecified: Secondary | ICD-10-CM | POA: Diagnosis not present

## 2021-08-22 DIAGNOSIS — R918 Other nonspecific abnormal finding of lung field: Secondary | ICD-10-CM | POA: Diagnosis not present

## 2021-08-22 MED ORDER — IOHEXOL 350 MG/ML SOLN
75.0000 mL | Freq: Once | INTRAVENOUS | Status: AC | PRN
  Administered 2021-08-22: 60 mL via INTRAVENOUS

## 2021-08-23 ENCOUNTER — Encounter: Payer: Self-pay | Admitting: Internal Medicine

## 2021-08-23 NOTE — Progress Notes (Signed)
  Radiation Oncology         [phone]) [phone] ________________________________  Name: Jill [name] MRN: [id number]  Date: 08/01/2021  DOB: 1951/02/25   End of Treatment Note  Diagnosis:   70 yo woman with cT2 cN2 cM1b adenocarcinoma of the left upper lung - Stage IVA - with solitary left frontal 7 mm brain metastasis     Indication for treatment:  Curative, Chemo-Radiotherapy       Radiation treatment dates:     The solitary left frontal 7 mm brain metastasis was treated 06/23/21 2.   The primary tumor and nodes were treated 06/15/21-08/01/21  Site/dose:    The solitary left frontal 7 mm brain metastasis was treated to 20 Gy in one fraction 2. The primary tumor and involved mediastinal adenopathy were treated to 66 Gy in 33 fractions of 2 Gy.  Beams/energy:    The solitary left frontal 7 mm brain metastasis was treated Left Frontal 7 mm target was treated using 3 Dynamic Conformal Arcs to a prescription dose of 20 Gy.  ExacTrac registration was performed for each couch angle.  The 100% isodose line was prescribed.  6 MV X-rays were delivered in the flattening filter free beam mode. 2. A five field 3D conformal treatment arrangement was used delivering 6 and 10 MV photons.  Daily image-guidance CT was used to align the treatment with the targeted volume  Narrative: The patient tolerated radiation treatment relatively well.  The patient experienced some esophagitis characterized as mild.  The patient also noted fatigue.  Plan: The patient has completed radiation treatment. The patient will return to radiation oncology clinic for routine followup in one month. I advised her to call or return sooner if she has any questions or concerns related to her recovery or treatment.  ________________________________  Sheral Apley. Tammi Klippel, M.D.

## 2021-08-24 ENCOUNTER — Encounter: Payer: Self-pay | Admitting: *Deleted

## 2021-08-24 ENCOUNTER — Other Ambulatory Visit: Payer: Self-pay

## 2021-08-24 ENCOUNTER — Inpatient Hospital Stay: Payer: Medicare HMO | Attending: Internal Medicine | Admitting: Internal Medicine

## 2021-08-24 VITALS — BP 142/74 | HR 83 | Temp 97.3°F | Resp 19 | Ht 67.0 in | Wt 134.0 lb

## 2021-08-24 DIAGNOSIS — I700 Atherosclerosis of aorta: Secondary | ICD-10-CM | POA: Diagnosis not present

## 2021-08-24 DIAGNOSIS — E039 Hypothyroidism, unspecified: Secondary | ICD-10-CM | POA: Diagnosis not present

## 2021-08-24 DIAGNOSIS — J439 Emphysema, unspecified: Secondary | ICD-10-CM | POA: Insufficient documentation

## 2021-08-24 DIAGNOSIS — Z5112 Encounter for antineoplastic immunotherapy: Secondary | ICD-10-CM | POA: Diagnosis not present

## 2021-08-24 DIAGNOSIS — R059 Cough, unspecified: Secondary | ICD-10-CM | POA: Insufficient documentation

## 2021-08-24 DIAGNOSIS — E785 Hyperlipidemia, unspecified: Secondary | ICD-10-CM | POA: Diagnosis not present

## 2021-08-24 DIAGNOSIS — Z79899 Other long term (current) drug therapy: Secondary | ICD-10-CM | POA: Diagnosis not present

## 2021-08-24 DIAGNOSIS — Z5111 Encounter for antineoplastic chemotherapy: Secondary | ICD-10-CM | POA: Diagnosis not present

## 2021-08-24 DIAGNOSIS — C3412 Malignant neoplasm of upper lobe, left bronchus or lung: Secondary | ICD-10-CM | POA: Insufficient documentation

## 2021-08-24 DIAGNOSIS — C7931 Secondary malignant neoplasm of brain: Secondary | ICD-10-CM | POA: Insufficient documentation

## 2021-08-24 HISTORY — DX: Encounter for antineoplastic immunotherapy: Z51.12

## 2021-08-24 MED ORDER — FOLIC ACID 1 MG PO TABS: 1.0000 mg | ORAL_TABLET | Freq: Every day | ORAL | 4 refills | Status: DC

## 2021-08-24 MED ORDER — CYANOCOBALAMIN 1000 MCG/ML IJ SOLN
1000.0000 ug | Freq: Once | INTRAMUSCULAR | Status: AC
  Administered 2021-08-24: 1000 ug via INTRAMUSCULAR

## 2021-08-24 NOTE — Progress Notes (Signed)
I spoke to [name] today.  She will be starting a new treatment regimen. I help to explain plan of care. I attached information about her medications she will be receiving.  She is also complaining of a cough.  I updated Dr. Julien Nordmann and notified patient to take Delsymover the counter.

## 2021-08-24 NOTE — Progress Notes (Signed)
DISCONTINUE ON PATHWAY REGIMEN - Non-Small Cell Lung     Administer weekly:     Paclitaxel      Carboplatin   **Always confirm dose/schedule in your pharmacy ordering system**  REASON: Other Reason PRIOR TREATMENT: [id number]: Carboplatin AUC=2 + Paclitaxel 45 mg/m2 Weekly During Radiation TREATMENT RESPONSE: Partial Response (PR)  START ON PATHWAY REGIMEN - Non-Small Cell Lung     A cycle is every 21 days:     Pembrolizumab      Pemetrexed      Carboplatin   **Always confirm dose/schedule in your pharmacy ordering system**  Patient Characteristics: Stage IV Metastatic, Nonsquamous, Molecular Analysis Completed, Molecular Alteration Present and Targeted Therapy Exhausted OR EGFR Exon 20+ or KRAS G12C+ or HER2+ Present and No Prior Chemo/Immunotherapy OR No Alteration Present, Initial  Chemotherapy/Immunotherapy, PS = 0, 1, BRAF/MET/KRAS/HER2 Mutation Positive, Candidate for Immunotherapy, PD-L1 Expression Positive 1-49% (TPS) / Negative / Not Tested / Awaiting Test Results and Immunotherapy Candidate Therapeutic Status: Stage IV Metastatic Histology: Nonsquamous Cell Broad Molecular Profiling Status: Molecular Analysis Completed Molecular Analysis Results: KRAS G12C Mutation Present and No Prior Chemo/Immunotherapy ECOG Performance Status: 1 Chemotherapy/Immunotherapy Line of Therapy: Initial Chemotherapy/Immunotherapy Immunotherapy Candidate Status: Candidate for Immunotherapy PD-L1 Expression Status: PD-L1 Negative Intent of Therapy: Non-Curative / Palliative Intent, Discussed with Patient

## 2021-08-24 NOTE — Progress Notes (Signed)
[place] [phone]   [phone]  OFFICE PROGRESS NOTE  [name], MD [place] [address]  DIAGNOSIS: Stage IV (T2 a, N2, M1b) non-small cell lung cancer, adenocarcinoma presented with left upper lobe lung mass in addition to left hilar and precarinal metastatic adenopathy and small left lower lobe pulmonary nodule in addition to solitary left frontal brain metastasis diagnosed in July 2022.  Molecular studies by [facility] showed  Positive KRAS G12C mutation PD-L1 expression was less than 1%   PRIOR THERAPY:  1) Status post SRS to the solitary brain metastasis. 20 Treatment for the locally advanced disease in the chest with carboplatin for AUC of 2 and paclitaxel 45 Mg/M2.  Status post 6 cycles.  Last dose was given 07/25/2021.  CURRENT THERAPY: Systemic chemotherapy with carboplatin for AUC of 5, Alimta 500 Mg/M2 and Keytruda 200 Mg IV every 3 weeks.  First dose August 31, 2021.  INTERVAL HISTORY: Jill [name] 70 y.o. female returns to the clinic today for follow-up visit.  The patient was accompanied by her daughter.  She is feeling fine today with no concerning complaints except for mild cough.  She denied having any current chest pain, shortness of breath or hemoptysis.  She denied having any recent weight loss or night sweats.  She has no nausea, vomiting, diarrhea or constipation.  She has no dysphagia or odynophagia.  The patient has no headache or visual changes.  She tolerated the previous course of concurrent chemoradiation fairly well.  She had repeat CT scan of the chest performed recently and she is here for evaluation and discussion of her lab and scan results as well as treatment options.   MEDICAL HISTORY: Past Medical History:  Diagnosis Date   Anxiety    ON PAXIL, XANAX   AVM (arteriovenous malformation) brain    s/p stent/coil   Blood transfusion    x 2   Cancer (HCC)    Left lung    COPD (chronic obstructive pulmonary disease) (HCC)    no inhaler, no oxygen   Coronary artery disease    Prior inferior MI with stent to RCA, s/p CABG in 2008   Depression    Dizziness    Dyspnea    with exertion, no oxygen   Fatigue    Foot injury 06/01/2017   right - RESOLVED, no longer an issue per patient 05/18/21   GERD (gastroesophageal reflux disease)    Headache(784.0)    UNRUPTURED CEREBRAL ANEURYSM   Hyperlipidemia    Hypertension    Hypothyroidism    Infected cyst of skin 09/18/2013   Left knee pain 06/05/2012   Lung mass    Left lung   Myocardial infarction ([place])    Normal nuclear stress test Ju;y 2012   No ischemia. EF 70%; fixed defect involving septum, inferoseptal and inferior wall   Pain, dental 02/06/2017   Poor dental hygiene    Retroperitoneal bleeding    Following cardiac cath   Tobacco abuse    Urine discoloration 09/18/2013   UTI (lower urinary tract infection) 10/16/2013    ALLERGIES:  is allergic to varenicline, ace inhibitors, prednisone, penicillins, and sulfa drugs cross reactors.  MEDICATIONS:  Current Outpatient Medications  Medication Sig Dispense Refill   amLODipine (NORVASC) 10 MG tablet Take 1 tablet by mouth once daily 90 tablet 1   aspirin EC 81 MG tablet Take 1 tablet (81 mg total) by mouth daily. [place]  tablet 2   atorvastatin (LIPITOR) 40 MG tablet Take 1 tablet (40 mg total) by mouth daily. 90 tablet 1   Cholecalciferol (VITAMIN D3) 250 MCG (10000 UT) capsule Take 10,000 mcg by mouth daily.     diphenhydramine-acetaminophen (TYLENOL PM) 25-500 MG TABS tablet Take 1 tablet by mouth every other day. At bedtime     escitalopram (LEXAPRO) 10 MG tablet Take 1 tablet by mouth once daily 90 tablet 1   levothyroxine (SYNTHROID) 137 MCG tablet Take 1 tablet by mouth once daily 90 tablet 1   losartan (COZAAR) 100 MG tablet Take 1 tablet by mouth once daily 90 tablet 1   metoprolol succinate (TOPROL-XL) 50 MG 24 hr tablet TAKE 1 TABLET BY MOUTH  ONCE DAILY.  TAKE  WITH  OR  IMMEDIATELY  FOLLOWING  A  MEAL. 90 tablet 1   Multiple Vitamins-Minerals (MULTIVITAMIN WITH MINERALS) tablet Take 1 tablet by mouth daily.     nitroGLYCERIN (NITROSTAT) 0.4 MG SL tablet Place 1 tablet (0.4 mg total) under the tongue every 5 (five) minutes as needed. For chest pain 25 tablet 6   Omega-3 Fatty Acids (FISH OIL) 1000 MG CAPS Take 2 capsules (2,000 mg total) by mouth in the morning and at bedtime. 120 capsule 1   omeprazole (PRILOSEC) 20 MG capsule Take 1 capsule (20 mg total) by mouth daily. Prolonged use may damage the kidney 90 capsule 1   prochlorperazine (COMPAZINE) 10 MG tablet Take 1 tablet (10 mg total) by mouth every 6 (six) hours as needed for nausea or vomiting. 30 tablet 0   Vitamin A 2400 MCG (8000 UT) TABS Take 2,400 mg by mouth daily.     vitamin B-12 (CYANOCOBALAMIN) 1000 MCG tablet Take 1,000 mcg by mouth daily.     No current facility-administered medications for this visit.    SURGICAL HISTORY:  Past Surgical History:  Procedure Laterality Date   CARDIAC CATHETERIZATION  01/02/2007   IT REVEALS MILD INFERIOR WALL HYPOKINESIS. THE EJECTION FRACTION IS AROUND 50%   COLONOSCOPY     CORONARY ARTERY BYPASS GRAFT  11/06/2006   LIMA to LAD, SVG to DX, SVG to LCX & SVG to OM 1 & 2, and SVG to PD   CORONARY STENT PLACEMENT     Remote past stent to RCA   EYE SURGERY Right    cataracts removed   HEMORRHOID SURGERY  11/07/1987   UPPER GI ENDOSCOPY     VENTRICULOSTOMY  10/06/2011   Procedure: VENTRICULOSTOMY;  Surgeon: Winfield Cunas;  Location: [facility];  Service: Neurosurgery;  Laterality: Right;  Insertion of Ventriculostomy Catheter   VIDEO BRONCHOSCOPY WITH ENDOBRONCHIAL NAVIGATION Left 05/20/2021   Procedure: VIDEO BRONCHOSCOPY WITH ENDOBRONCHIAL NAVIGATION;  Surgeon: Garner Nash, DO;  Location: [facility];  Service: Pulmonary;  Laterality: Left;   VIDEO BRONCHOSCOPY WITH ENDOBRONCHIAL ULTRASOUND Bilateral 05/20/2021   Procedure:  VIDEO BRONCHOSCOPY WITH ENDOBRONCHIAL ULTRASOUND;  Surgeon: Garner Nash, DO;  Location: [facility];  Service: Pulmonary;  Laterality: Bilateral;   WISDOM TOOTH EXTRACTION      REVIEW OF SYSTEMS:  Constitutional: negative Eyes: negative Ears, nose, mouth, throat, and face: negative Respiratory: positive for cough Cardiovascular: negative Gastrointestinal: negative Genitourinary:negative Integument/breast: negative Hematologic/lymphatic: negative Musculoskeletal:negative Neurological: negative Behavioral/Psych: negative Endocrine: negative Allergic/Immunologic: negative   PHYSICAL EXAMINATION: General appearance: alert, cooperative, and no distress Head: Normocephalic, without obvious abnormality, atraumatic Neck: no adenopathy, no JVD, supple, symmetrical, trachea midline, and thyroid not enlarged, symmetric, no tenderness/mass/nodules Lymph nodes: Cervical, supraclavicular, and  axillary nodes normal. Resp: clear to auscultation bilaterally Back: symmetric, no curvature. ROM normal. No CVA tenderness. Cardio: regular rate and rhythm, S1, S2 normal, no murmur, click, rub or gallop GI: soft, non-tender; bowel sounds normal; no masses,  no organomegaly Extremities: extremities normal, atraumatic, no cyanosis or edema Neurologic: Alert and oriented X 3, normal strength and tone. Normal symmetric reflexes. Normal coordination and gait  ECOG PERFORMANCE STATUS: 1 - Symptomatic but completely ambulatory  Blood pressure (!) 142/74, pulse 83, temperature (!) 97.3 F (36.3 C), temperature source Tympanic, resp. rate 19, height $RemoveBe'5\' 7"'RRxxWCjSm$  (1.702 m), weight 134 lb (60.8 kg), SpO2 95 %.  LABORATORY DATA: Lab Results  Component Value Date   WBC 3.5 (L) 07/25/2021   HGB 11.0 (L) 07/25/2021   HCT 32.9 (L) 07/25/2021   MCV 97.3 07/25/2021   PLT 189 07/25/2021      Chemistry      Component Value Date/Time   NA 141 07/25/2021 0910   NA 141 04/12/2021 1356   K 4.1 07/25/2021 0910   CL 106  07/25/2021 0910   CO2 25 07/25/2021 0910   BUN 18 07/25/2021 0910   BUN 13 04/12/2021 1356   CREATININE 0.84 07/25/2021 0910   CREATININE 0.99 08/29/2016 1130      Component Value Date/Time   CALCIUM 9.4 07/25/2021 0910   ALKPHOS 63 07/25/2021 0910   AST 15 07/25/2021 0910   ALT 15 07/25/2021 0910   BILITOT 0.3 07/25/2021 0910       RADIOGRAPHIC STUDIES: CT Chest W Contrast  Result Date: 08/24/2021 CLINICAL DATA:  70 year old female with history of non-small cell lung cancer diagnosed in July 2022 status post chemotherapy and radiation therapy (completed in September). Follow-up study. EXAM: CT CHEST WITH CONTRAST TECHNIQUE: Multidetector CT imaging of the chest was performed during intravenous contrast administration. CONTRAST:  54mL OMNIPAQUE IOHEXOL 350 MG/ML SOLN COMPARISON:  Chest CT 05/18/2021. FINDINGS: Cardiovascular: Heart size is normal. There is no significant pericardial fluid, thickening or pericardial calcification. There is aortic atherosclerosis, as well as atherosclerosis of the great vessels of the mediastinum and the coronary arteries, including calcified atherosclerotic plaque in the left main, left anterior descending, left circumflex and right coronary arteries. Status post median sternotomy for CABG including LIMA to the LAD. Mediastinum/Nodes: Several prominent borderline enlarged mediastinal and left hilar lymph nodes are noted, measuring up to 1 cm in short axis in the left hilar region (axial image 66 of series 2) and 1.1 cm in short axis in the low right paratracheal nodal station. Esophagus is unremarkable in appearance. No axillary lymphadenopathy. Lungs/Pleura: Partially cavitary macrolobulated mass with spiculated margins in the left upper lobe (axial image 61 of series 5 and coronal image 69 of series 6) measuring 3.1 x 2.6 x 2.1 cm appears smaller than the prior examination. Spiculations from this lesion extend laterally to the overlying pleura which is  retracted toward the lesion, and extend posteriorly to come in contact with the left major fissure. 7 x 6 mm left upper lobe pulmonary nodule (axial image 52 of series 5), similar in retrospect to the prior study. 5 mm subpleural nodule in the anterior aspect of the left lower lobe abutting the major fissure (axial image 79 of series 5), stable. No other definite suspicious appearing pulmonary nodules or masses are noted. No acute consolidative airspace disease. No pleural effusions. Diffuse bronchial wall thickening with moderate centrilobular and paraseptal emphysema. Upper Abdomen: Aortic atherosclerosis. Low-attenuation lesions associated with the left kidney, largest of which  is 2.7 cm in the anterior aspect of the interpolar region, compatible with simple cysts. Aortic atherosclerosis. Subcentimeter low-attenuation lesion in segment 7 of the liver, too small to characterize, but similar to the prior study and statistically likely to represent a tiny cyst. Musculoskeletal: Median sternotomy wires. There are no aggressive appearing lytic or blastic lesions noted in the visualized portions of the skeleton. IMPRESSION: 1. Today's study demonstrates slight decreased size of large partially cavitary macrolobulated and spiculated left upper lobe mass with borderline enlarged left hilar and low right paratracheal lymph nodes which were previously hypermetabolic on PET-CT 66/04/3015. Findings are compatible with at least T2a, N3, Mx disease (i.e., stage IIIB). 2. Small pulmonary nodules in the left upper lobe and left lower lobe are stable compared to the prior examination, as above. Continued attention on follow-up studies is recommended. 3. Aortic atherosclerosis, in addition to left main and 3 vessel coronary artery disease. Status post median sternotomy for CABG including LIMA to the LAD. 4. Diffuse bronchial wall thickening with moderate centrilobular and paraseptal emphysema; imaging findings suggestive of  underlying COPD. Aortic Atherosclerosis (ICD10-I70.0) and Emphysema (ICD10-J43.9). Electronically Signed   By: Vinnie Langton M.D.   On: 08/24/2021 06:27    ASSESSMENT AND PLAN: This is a very pleasant 70 years old white female diagnosed with a stage IV (T2 a, N2, M1 B) non-small cell lung cancer, adenocarcinoma presented with left upper lobe lung mass in addition to left hilar and precarinal metastatic adenopathy as well as small left lower lobe pulmonary nodule and solitary left frontal brain metastasis diagnosed in July 2022.  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 The patient is status post SRS to the solitary brain metastasis. She underwent a course of concurrent chemoradiation to the locally advanced disease in the chest with carboplatin for AUC of 2 and paclitaxel 45 Mg/M2 status post 6 cycles.  The patient has been tolerating her treatment well with no concerning adverse effects. The patient is feeling fine today with no concerning complaints. She had repeat CT scan of the chest performed recently.  I personally and independently reviewed the scans and discussed the results with the patient and her daughter. Her scan showed mild improvement of her disease with no concerning findings for progression. I had a lengthy discussion with the patient today about her current condition and treatment options. Technically the patient has a stage IV lung cancer with brain metastasis and I recommended for her first-line systemic chemotherapy with carboplatin, Alimta and Keytruda every 3 weeks for 4 cycles followed by maintenance treatment of Alimta and Keytruda starting from cycle #5 if the patient has no evidence for progression. She is interested in this treatment and expected to start the first dose of her treatment next week. I will arrange for the patient to receive vitamin B12 injection today. I will send prescription of folic acid to her  pharmacy. The patient will come back for follow-up visit in 4 weeks for evaluation with the start of cycle #2. She was advised to call immediately if she has any concerning symptoms in the interval. The patient voices understanding of current disease status and treatment options and is in agreement with the current care plan.  All questions were answered. The patient knows to call the clinic with any problems, questions or concerns. We can certainly see the patient much sooner if necessary.  The total time spent in the appointment was 35 minutes.  Disclaimer: This note was dictated  with voice recognition software. Similar sounding words can inadvertently be transcribed and may not be corrected upon review.

## 2021-08-26 ENCOUNTER — Other Ambulatory Visit: Payer: Self-pay | Admitting: Family Medicine

## 2021-08-26 ENCOUNTER — Telehealth: Payer: Self-pay | Admitting: Internal Medicine

## 2021-08-26 DIAGNOSIS — E785 Hyperlipidemia, unspecified: Secondary | ICD-10-CM

## 2021-08-26 DIAGNOSIS — I10 Essential (primary) hypertension: Secondary | ICD-10-CM

## 2021-08-26 NOTE — Telephone Encounter (Signed)
Scheduled follow-up appointments per 10/19 los. Patient is aware.

## 2021-08-26 NOTE — Telephone Encounter (Signed)
Left message with follow-up appointments per 10/19 los.

## 2021-08-30 MED FILL — Fosaprepitant Dimeglumine For IV Infusion 150 MG (Base Eq): INTRAVENOUS | Qty: 5 | Status: AC

## 2021-08-30 MED FILL — Dexamethasone Sodium Phosphate Inj 100 MG/10ML: INTRAMUSCULAR | Qty: 1 | Status: AC

## 2021-08-30 NOTE — Progress Notes (Signed)
Pharmacist Chemotherapy Monitoring - Initial Assessment    Anticipated start date: 08/31/21   The following has been reviewed per standard work regarding the patient's treatment regimen: The patient's diagnosis, treatment plan and drug doses, and organ/hematologic function Lab orders and baseline tests specific to treatment regimen  The treatment plan start date, drug sequencing, and pre-medications Prior authorization status  Patient's documented medication list, including drug-drug interaction screen and prescriptions for anti-emetics and supportive care specific to the treatment regimen The drug concentrations, fluid compatibility, administration routes, and timing of the medications to be used The patient's access for treatment and lifetime cumulative dose history, if applicable  The patient's medication allergies and previous infusion related reactions, if applicable   Changes made to treatment plan:  N/A  Follow up needed:  N/A    Kennith Center, Pharm.D., CPP 08/30/2021@3 :15 PM

## 2021-08-31 ENCOUNTER — Encounter: Payer: Self-pay | Admitting: Urology

## 2021-08-31 ENCOUNTER — Inpatient Hospital Stay: Payer: Medicare HMO

## 2021-08-31 ENCOUNTER — Telehealth: Payer: Self-pay | Admitting: Urology

## 2021-08-31 ENCOUNTER — Other Ambulatory Visit: Payer: Self-pay | Admitting: Internal Medicine

## 2021-08-31 ENCOUNTER — Ambulatory Visit
Admission: RE | Admit: 2021-08-31 | Discharge: 2021-08-31 | Disposition: A | Discharge status: Home / Self Care | Payer: Medicare HMO | Source: Ambulatory Visit | Attending: Urology | Admitting: Urology

## 2021-08-31 ENCOUNTER — Other Ambulatory Visit: Payer: Self-pay

## 2021-08-31 VITALS — BP 130/64 | HR 63 | Temp 98.2°F | Resp 19 | Wt 134.4 lb

## 2021-08-31 DIAGNOSIS — C3412 Malignant neoplasm of upper lobe, left bronchus or lung: Secondary | ICD-10-CM

## 2021-08-31 DIAGNOSIS — J439 Emphysema, unspecified: Secondary | ICD-10-CM | POA: Diagnosis not present

## 2021-08-31 DIAGNOSIS — I700 Atherosclerosis of aorta: Secondary | ICD-10-CM | POA: Diagnosis not present

## 2021-08-31 DIAGNOSIS — Z5112 Encounter for antineoplastic immunotherapy: Secondary | ICD-10-CM | POA: Diagnosis not present

## 2021-08-31 DIAGNOSIS — R059 Cough, unspecified: Secondary | ICD-10-CM | POA: Diagnosis not present

## 2021-08-31 DIAGNOSIS — E785 Hyperlipidemia, unspecified: Secondary | ICD-10-CM | POA: Diagnosis not present

## 2021-08-31 DIAGNOSIS — E039 Hypothyroidism, unspecified: Secondary | ICD-10-CM | POA: Diagnosis not present

## 2021-08-31 DIAGNOSIS — Z5111 Encounter for antineoplastic chemotherapy: Secondary | ICD-10-CM | POA: Diagnosis not present

## 2021-08-31 DIAGNOSIS — Z79899 Other long term (current) drug therapy: Secondary | ICD-10-CM | POA: Diagnosis not present

## 2021-08-31 DIAGNOSIS — C3490 Malignant neoplasm of unspecified part of unspecified bronchus or lung: Secondary | ICD-10-CM

## 2021-08-31 DIAGNOSIS — C7931 Secondary malignant neoplasm of brain: Secondary | ICD-10-CM | POA: Diagnosis not present

## 2021-08-31 LAB — CMP (CANCER CENTER ONLY)
ALT: 11 U/L (ref 0–44)
AST: 15 U/L (ref 15–41)
Albumin: 3.4 g/dL — ABNORMAL LOW (ref 3.5–5.0)
Alkaline Phosphatase: 73 U/L (ref 38–126)
Anion gap: 11 (ref 5–15)
BUN: 17 mg/dL (ref 8–23)
CO2: 25 mmol/L (ref 22–32)
Calcium: 9.2 mg/dL (ref 8.9–10.3)
Chloride: 106 mmol/L (ref 98–111)
Creatinine: 0.9 mg/dL (ref 0.44–1.00)
GFR, Estimated: >60 mL/min (ref >60)
Glucose, Bld: 139 mg/dL — ABNORMAL HIGH (ref 70–99)
Potassium: 3.8 mmol/L (ref 3.5–5.1)
Sodium: 142 mmol/L (ref 135–145)
Total Bilirubin: 0.4 mg/dL (ref 0.3–1.2)
Total Protein: 6.4 g/dL — ABNORMAL LOW (ref 6.5–8.1)

## 2021-08-31 LAB — CBC WITH DIFFERENTIAL (CANCER CENTER ONLY)
Abs Immature Granulocytes: 0.01 10*3/uL (ref 0.00–0.07)
Basophils Absolute: 0.1 10*3/uL (ref 0.0–0.1)
Basophils Relative: 2 %
Eosinophils Absolute: 0.1 10*3/uL (ref 0.0–0.5)
Eosinophils Relative: 3 %
HCT: 36 % (ref 36.0–46.0)
Hemoglobin: 11.9 g/dL — ABNORMAL LOW (ref 12.0–15.0)
Immature Granulocytes: 0 %
Lymphocytes Relative: 21 %
Lymphs Abs: 1.1 10*3/uL (ref 0.7–4.0)
MCH: 33.2 pg (ref 26.0–34.0)
MCHC: 33.1 g/dL (ref 30.0–36.0)
MCV: 100.6 fL — ABNORMAL HIGH (ref 80.0–100.0)
Monocytes Absolute: 0.6 10*3/uL (ref 0.1–1.0)
Monocytes Relative: 11 %
Neutro Abs: 3.1 10*3/uL (ref 1.7–7.7)
Neutrophils Relative %: 63 %
Platelet Count: 252 10*3/uL (ref 150–400)
RBC: 3.58 MIL/uL — ABNORMAL LOW (ref 3.87–5.11)
RDW: 15.3 % (ref 11.5–15.5)
WBC Count: 5 10*3/uL (ref 4.0–10.5)
nRBC: 0 % (ref 0.0–0.2)

## 2021-08-31 LAB — TSH: TSH: 0.199 u[IU]/mL — ABNORMAL LOW (ref 0.308–3.960)

## 2021-08-31 MED ORDER — SODIUM CHLORIDE 0.9 % IV SOLN
370.0000 mg | Freq: Once | INTRAVENOUS | Status: AC
  Administered 2021-08-31: 370 mg via INTRAVENOUS
  Filled 2021-08-31: qty 37

## 2021-08-31 MED ORDER — PALONOSETRON HCL INJECTION 0.25 MG/5ML
0.2500 mg | Freq: Once | INTRAVENOUS | Status: AC
  Administered 2021-08-31: 0.25 mg via INTRAVENOUS
  Filled 2021-08-31: qty 5

## 2021-08-31 MED ORDER — SODIUM CHLORIDE 0.9 % IV SOLN
200.0000 mg | Freq: Once | INTRAVENOUS | Status: AC
  Administered 2021-08-31: 200 mg via INTRAVENOUS
  Filled 2021-08-31: qty 8

## 2021-08-31 MED ORDER — SODIUM CHLORIDE 0.9 % IV SOLN
150.0000 mg | Freq: Once | INTRAVENOUS | Status: AC
  Administered 2021-08-31: 150 mg via INTRAVENOUS
  Filled 2021-08-31: qty 150

## 2021-08-31 MED ORDER — SODIUM CHLORIDE 0.9 % IV SOLN
10.0000 mg | Freq: Once | INTRAVENOUS | Status: AC
  Administered 2021-08-31: 10 mg via INTRAVENOUS
  Filled 2021-08-31: qty 10

## 2021-08-31 MED ORDER — SODIUM CHLORIDE 0.9 % IV SOLN
500.0000 mg/m2 | Freq: Once | INTRAVENOUS | Status: AC
  Administered 2021-08-31: 900 mg via INTRAVENOUS
  Filled 2021-08-31: qty 20

## 2021-08-31 MED ORDER — SODIUM CHLORIDE 0.9 % IV SOLN: Freq: Once | INTRAVENOUS | Status: AC

## 2021-08-31 NOTE — Progress Notes (Signed)
Radiation Oncology         [phone] ________________________________  Name: JOLIE STROHECKER MRN: [id number]  Date: 08/31/2021  DOB: [birth year]  Post Treatment Note  CC: [name], MD  Curt Bears, MD  Diagnosis:   70 yo woman with cT2 cN2 cM1b, NSCLC, adenocarcinoma of the left upper lung - Stage IVA - with solitary left frontal 7 mm brain metastasis     Interval Since Last Radiation:  4 weeks   06/23/21: SRS// PTV1: The solitary 7 mm left frontal  brain metastasis was treated to 20 Gy in a single fraction 06/15/21 - 08/01/21: The primary tumor in the left lung and nodes were treated to 66 Gy in 33 fractions of 2 Gy (concurrent with chemotherapy).   Narrative:  I attempted to call the patient to conduct her routine scheduled 1 month follow up visit via telephone to spare the patient unnecessary potential exposure in the healthcare setting during the current COVID-19 pandemic.  The patient was notified in advance and gave permission to proceed with this visit format.  Unfortunately, she was not available so I left a detailed message on her voicemail  She tolerated radiation treatment relatively well.  She did experience some esophagitis, characterized as mild as well as mild to moderate fatigue.  She also continued with a persistent cough but denied hemoptysis, yellowish/green sputum, fever or chills.                           On review of systems obtained prior to this visit by the nurse, the patient states that she is doing well in general and is currently without complaints aside from a mild, persistent cough that is unchanged recently.  She specifically denies chest pain, increased shortness of breath or hemoptysis.  She reports a healthy appetite and is maintaining her weight.  She denies dysphagia, abdominal pain, nausea, vomiting or night sweats.  She has occasional, mild headaches but nothing that has been persistent or concerning.  She denies any decrease in her visual or  auditory acuity, dizziness/imbalance, focal weakness in the upper or lower extremities, tremor or seizure activity.  ALLERGIES:  is allergic to varenicline, ace inhibitors, prednisone, penicillins, and sulfa drugs cross reactors.  Meds: Current Outpatient Medications  Medication Sig Dispense Refill   amLODipine (NORVASC) 10 MG tablet Take 1 tablet by mouth once daily 90 tablet 1   aspirin EC 81 MG tablet Take 1 tablet (81 mg total) by mouth daily. 90 tablet 2   atorvastatin (LIPITOR) 40 MG tablet Take 1 tablet by mouth once daily 90 tablet 1   Cholecalciferol (VITAMIN D3) 250 MCG (10000 UT) capsule Take 10,000 mcg by mouth daily.     diphenhydramine-acetaminophen (TYLENOL PM) 25-500 MG TABS tablet Take 1 tablet by mouth every other day. At bedtime     escitalopram (LEXAPRO) 10 MG tablet Take 1 tablet by mouth once daily 90 tablet 1   folic acid (FOLVITE) 1 MG tablet Take 1 tablet (1 mg total) by mouth daily. 30 tablet 4   levothyroxine (SYNTHROID) 137 MCG tablet Take 1 tablet by mouth once daily 90 tablet 1   losartan (COZAAR) 100 MG tablet Take 1 tablet by mouth once daily 90 tablet 1   metoprolol succinate (TOPROL-XL) 50 MG 24 hr tablet TAKE 1 TABLET BY MOUTH ONCE DAILY WITH MEALS OR  IMMEDIATELY  FOLLOWING 90 tablet 1   Multiple Vitamins-Minerals (MULTIVITAMIN WITH MINERALS) tablet Take 1  tablet by mouth daily.     nitroGLYCERIN (NITROSTAT) 0.4 MG SL tablet Place 1 tablet (0.4 mg total) under the tongue every 5 (five) minutes as needed. For chest pain 25 tablet 6   Omega-3 Fatty Acids (FISH OIL) 1000 MG CAPS Take 2 capsules (2,000 mg total) by mouth in the morning and at bedtime. 120 capsule 1   omeprazole (PRILOSEC) 20 MG capsule Take 1 capsule (20 mg total) by mouth daily. Prolonged use may damage the kidney 90 capsule 1   prochlorperazine (COMPAZINE) 10 MG tablet Take 1 tablet (10 mg total) by mouth every 6 (six) hours as needed for nausea or vomiting. 30 tablet 0   Vitamin A 2400 MCG  (8000 UT) TABS Take 2,400 mg by mouth daily.     vitamin B-12 (CYANOCOBALAMIN) 1000 MCG tablet Take 1,000 mcg by mouth daily.     No current facility-administered medications for this encounter.   Facility-Administered Medications Ordered in Other Encounters  Medication Dose Route Frequency Provider Last Rate Last Admin   CARBOplatin (PARAPLATIN) 370 mg in sodium chloride 0.9 % 250 mL chemo infusion  370 mg Intravenous Once Curt Bears, MD        Physical Findings:  vitals were not taken for this visit.  Pain Assessment Pain Score: 0-No pain/10 Unable to assess due to telephone follow-up visit format.  Lab Findings: Lab Results  Component Value Date   WBC 5.0 08/31/2021   HGB 11.9 (L) 08/31/2021   HCT 36.0 08/31/2021   MCV 100.6 (H) 08/31/2021   PLT 252 08/31/2021     Radiographic Findings: CT Chest W Contrast  Result Date: 08/24/2021 CLINICAL DATA:  70 year old female with history of non-small cell lung cancer diagnosed in July 2022 status post chemotherapy and radiation therapy (completed in September). Follow-up study. EXAM: CT CHEST WITH CONTRAST TECHNIQUE: Multidetector CT imaging of the chest was performed during intravenous contrast administration. CONTRAST:  14mL OMNIPAQUE IOHEXOL 350 MG/ML SOLN COMPARISON:  Chest CT 05/18/2021. FINDINGS: Cardiovascular: Heart size is normal. There is no significant pericardial fluid, thickening or pericardial calcification. There is aortic atherosclerosis, as well as atherosclerosis of the great vessels of the mediastinum and the coronary arteries, including calcified atherosclerotic plaque in the left main, left anterior descending, left circumflex and right coronary arteries. Status post median sternotomy for CABG including LIMA to the LAD. Mediastinum/Nodes: Several prominent borderline enlarged mediastinal and left hilar lymph nodes are noted, measuring up to 1 cm in short axis in the left hilar region (axial image 66 of series 2) and  1.1 cm in short axis in the low right paratracheal nodal station. Esophagus is unremarkable in appearance. No axillary lymphadenopathy. Lungs/Pleura: Partially cavitary macrolobulated mass with spiculated margins in the left upper lobe (axial image 61 of series 5 and coronal image 69 of series 6) measuring 3.1 x 2.6 x 2.1 cm appears smaller than the prior examination. Spiculations from this lesion extend laterally to the overlying pleura which is retracted toward the lesion, and extend posteriorly to come in contact with the left major fissure. 7 x 6 mm left upper lobe pulmonary nodule (axial image 52 of series 5), similar in retrospect to the prior study. 5 mm subpleural nodule in the anterior aspect of the left lower lobe abutting the major fissure (axial image 79 of series 5), stable. No other definite suspicious appearing pulmonary nodules or masses are noted. No acute consolidative airspace disease. No pleural effusions. Diffuse bronchial wall thickening with moderate centrilobular and paraseptal emphysema. Upper  Abdomen: Aortic atherosclerosis. Low-attenuation lesions associated with the left kidney, largest of which is 2.7 cm in the anterior aspect of the interpolar region, compatible with simple cysts. Aortic atherosclerosis. Subcentimeter low-attenuation lesion in segment 7 of the liver, too small to characterize, but similar to the prior study and statistically likely to represent a tiny cyst. Musculoskeletal: Median sternotomy wires. There are no aggressive appearing lytic or blastic lesions noted in the visualized portions of the skeleton. IMPRESSION: 1. Today's study demonstrates slight decreased size of large partially cavitary macrolobulated and spiculated left upper lobe mass with borderline enlarged left hilar and low right paratracheal lymph nodes which were previously hypermetabolic on PET-CT 76/19/5093. Findings are compatible with at least T2a, N3, Mx disease (i.e., stage IIIB). 2. Small  pulmonary nodules in the left upper lobe and left lower lobe are stable compared to the prior examination, as above. Continued attention on follow-up studies is recommended. 3. Aortic atherosclerosis, in addition to left main and 3 vessel coronary artery disease. Status post median sternotomy for CABG including LIMA to the LAD. 4. Diffuse bronchial wall thickening with moderate centrilobular and paraseptal emphysema; imaging findings suggestive of underlying COPD. Aortic Atherosclerosis (ICD10-I70.0) and Emphysema (ICD10-J43.9). Electronically Signed   By: Vinnie Langton M.D.   On: 08/24/2021 06:27    Impression/Plan: 1. 70 yo woman with cT2 cN2 cM1b, NSCLC, adenocarcinoma of the left upper lung - Stage IVA - with solitary left frontal 7 mm brain metastasis. She appears to have recovered well from the effects of her recent radiotherapy and is currently without complaints.  She had a recent follow-up CT chest with Dr. Julien Nordmann on 08/22/2021 which showed mild improvement in her disease and no evidence of disease progression.  She has completed 6 cycles of systemic therapy with carboplatin/paclitaxel and the current plan is to proceed with further systemic therapy utilizing carboplatin, Alimta and Keytruda for 4 cycles followed by maintenance therapy with Alimta and Keytruda starting from cycle 5 as long as there is no evidence of disease progression at that time.  She will continue management of her systemic disease under the care and direction of Dr. Earlie Server.  Regarding the solitary brain metastasis, we will continue to monitor closely with serial MRI brain scans.  She is scheduled for her first posttreatment MRI brain scan on 09/15/2021 and we will plan to follow-up by telephone to review the results and recommendations from multidisciplinary brain conference following that scan.  Pending this scan is stable, we will then proceed with serial MRI brain scans every 3 months going forward and plan to connect by  telephone following each scan to review results and recommendations.  This information was left on her voicemail with instruction to call back with any questions or concerns regarding the radiation or this plan.   Nicholos Johns, PA-C

## 2021-08-31 NOTE — Telephone Encounter (Signed)
I attempted to call the patient to conduct her scheduled 1 month follow-up visit at 11 AM this morning and had to leave a message on voicemail.  It appears that she is having an infusion which started at 8:30 AM this morning so I just left her a message to return my call at her convenience so that we could touch base and see how things are going since she completed her radiation.  Nicholos Johns, MMS, PA-C [facility] [place]: [phone]  Fax: [phone]

## 2021-08-31 NOTE — Patient Instructions (Signed)
[facility]  Discharge Instructions: Thank you for choosing [facility] to provide your oncology and hematology care.   If you have a lab appointment with the [facility], please go directly to the [facility] and check in at the registration area.   Wear comfortable clothing and clothing appropriate for easy access to any Portacath or PICC line.   We strive to give you quality time with your provider. You may need to reschedule your appointment if you arrive late (15 or more minutes).  Arriving late affects you and other patients whose appointments are after yours.  Also, if you miss three or more appointments without notifying the office, you may be dismissed from the clinic at the provider's discretion.      For prescription refill requests, have your pharmacy contact our office and allow 72 hours for refills to be completed.    Today you received the following chemotherapy and/or immunotherapy agents Pembrolizumab, Pemetrexed, and Carboplatin      To help prevent nausea and vomiting after your treatment, we encourage you to take your nausea medication as directed.  BELOW ARE SYMPTOMS THAT SHOULD BE REPORTED IMMEDIATELY: *FEVER GREATER THAN 100.4 F (38 C) OR HIGHER *CHILLS OR SWEATING *NAUSEA AND VOMITING THAT IS NOT CONTROLLED WITH YOUR NAUSEA MEDICATION *UNUSUAL SHORTNESS OF BREATH *UNUSUAL BRUISING OR BLEEDING *URINARY PROBLEMS (pain or burning when urinating, or frequent urination) *BOWEL PROBLEMS (unusual diarrhea, constipation, pain near the anus) TENDERNESS IN MOUTH AND THROAT WITH OR WITHOUT PRESENCE OF ULCERS (sore throat, sores in mouth, or a toothache) UNUSUAL RASH, SWELLING OR PAIN  UNUSUAL VAGINAL DISCHARGE OR ITCHING   Items with * indicate a potential emergency and should be followed up as soon as possible or go to the Emergency Department if any problems should occur.  Please show the CHEMOTHERAPY ALERT CARD or  IMMUNOTHERAPY ALERT CARD at check-in to the Emergency Department and triage nurse.  Should you have questions after your visit or need to cancel or reschedule your appointment, please contact [facility]  Dept: [phone]  and follow the prompts.  Office hours are 8:00 a.m. to 4:30 p.m. Monday - Friday. Please note that voicemails left after 4:00 p.m. may not be returned until the following business day.  We are closed weekends and major holidays. You have access to a nurse at all times for urgent questions. Please call the main number to the clinic Dept: [phone] and follow the prompts.   For any non-urgent questions, you may also contact your provider using MyChart. We now offer e-Visits for anyone 72 and older to request care online for non-urgent symptoms. For details visit [email or web address].   Also download the MyChart app! Go to the app store, search "MyChart", open the app, select [facility], and log in with your MyChart username and password.  Due to Covid, a mask is required upon entering the hospital/clinic. If you do not have a mask, one will be given to you upon arrival. For doctor visits, patients may have 1 support person aged 46 or older with them. For treatment visits, patients cannot have anyone with them due to current Covid guidelines and our immunocompromised population.   Pembrolizumab injection What is this medication? PEMBROLIZUMAB (pem broe liz ue mab) is a monoclonal antibody. It is used to treat certain types of cancer. This medicine may be used for other purposes; ask your health care provider or pharmacist if you have questions. COMMON BRAND  NAME(S): Keytruda What should I tell my care team before I take this medication? They need to know if you have any of these conditions: autoimmune diseases like Crohn's disease, ulcerative colitis, or lupus have had or planning to have an allogeneic stem cell transplant (uses someone else's  stem cells) history of organ transplant history of chest radiation nervous system problems like myasthenia gravis or Guillain-Barre syndrome an unusual or allergic reaction to pembrolizumab, other medicines, foods, dyes, or preservatives pregnant or trying to get pregnant breast-feeding How should I use this medication? This medicine is for infusion into a vein. It is given by a health care professional in a hospital or clinic setting. A special MedGuide will be given to you before each treatment. Be sure to read this information carefully each time. Talk to your pediatrician regarding the use of this medicine in children. While this drug may be prescribed for children as young as 6 months for selected conditions, precautions do apply. Overdosage: If you think you have taken too much of this medicine contact a poison control center or emergency room at once. NOTE: This medicine is only for you. Do not share this medicine with others. What if I miss a dose? It is important not to miss your dose. Call your doctor or health care professional if you are unable to keep an appointment. What may interact with this medication? Interactions have not been studied. This list may not describe all possible interactions. Give your health care provider a list of all the medicines, herbs, non-prescription drugs, or dietary supplements you use. Also tell them if you smoke, drink alcohol, or use illegal drugs. Some items may interact with your medicine. What should I watch for while using this medication? Your condition will be monitored carefully while you are receiving this medicine. You may need blood work done while you are taking this medicine. Do not become pregnant while taking this medicine or for 4 months after stopping it. Women should inform their doctor if they wish to become pregnant or think they might be pregnant. There is a potential for serious side effects to an unborn child. Talk to your health  care professional or pharmacist for more information. Do not breast-feed an infant while taking this medicine or for 4 months after the last dose. What side effects may I notice from receiving this medication? Side effects that you should report to your doctor or health care professional as soon as possible: allergic reactions like skin rash, itching or hives, swelling of the face, lips, or tongue bloody or black, tarry breathing problems changes in vision chest pain chills confusion constipation cough diarrhea dizziness or feeling faint or lightheaded fast or irregular heartbeat fever flushing joint pain low blood counts - this medicine may decrease the number of white blood cells, red blood cells and platelets. You may be at increased risk for infections and bleeding. muscle pain muscle weakness pain, tingling, numbness in the hands or feet persistent headache redness, blistering, peeling or loosening of the skin, including inside the mouth signs and symptoms of high blood sugar such as dizziness; dry mouth; dry skin; fruity breath; nausea; stomach pain; increased hunger or thirst; increased urination signs and symptoms of kidney injury like trouble passing urine or change in the amount of urine signs and symptoms of liver injury like dark urine, light-colored stools, loss of appetite, nausea, right upper belly pain, yellowing of the eyes or skin sweating swollen lymph nodes weight loss Side effects that usually do  not require medical attention (report to your doctor or health care professional if they continue or are bothersome): decreased appetite hair loss tiredness This list may not describe all possible side effects. Call your doctor for medical advice about side effects. You may report side effects to FDA at 1-800-FDA-1088. Where should I keep my medication? This drug is given in a hospital or clinic and will not be stored at home. NOTE: This sheet is a summary. It may  not cover all possible information. If you have questions about this medicine, talk to your doctor, pharmacist, or health care provider.  2022 Elsevier/Gold Standard (2019-09-24 21:44:53)  Pemetrexed injection What is this medication? PEMETREXED (PEM e TREX ed) is a chemotherapy drug used to treat lung cancers like non-small cell lung cancer and mesothelioma. It may also be used to treat other cancers. This medicine may be used for other purposes; ask your health care provider or pharmacist if you have questions. COMMON BRAND NAME(S): Alimta, PEMFEXY What should I tell my care team before I take this medication? They need to know if you have any of these conditions: infection (especially a virus infection such as chickenpox, cold sores, or herpes) kidney disease low blood counts, like low white cell, platelet, or red cell counts lung or breathing disease, like asthma radiation therapy an unusual or allergic reaction to pemetrexed, other medicines, foods, dyes, or preservative pregnant or trying to get pregnant breast-feeding How should I use this medication? This drug is given as an infusion into a vein. It is administered in a hospital or clinic by a specially trained health care professional. Talk to your pediatrician regarding the use of this medicine in children. Special care may be needed. Overdosage: If you think you have taken too much of this medicine contact a poison control center or emergency room at once. NOTE: This medicine is only for you. Do not share this medicine with others. What if I miss a dose? It is important not to miss your dose. Call your doctor or health care professional if you are unable to keep an appointment. What may interact with this medication? This medicine may interact with the following medications: Ibuprofen This list may not describe all possible interactions. Give your health care provider a list of all the medicines, herbs, non-prescription drugs,  or dietary supplements you use. Also tell them if you smoke, drink alcohol, or use illegal drugs. Some items may interact with your medicine. What should I watch for while using this medication? Visit your doctor for checks on your progress. This drug may make you feel generally unwell. This is not uncommon, as chemotherapy can affect healthy cells as well as cancer cells. Report any side effects. Continue your course of treatment even though you feel ill unless your doctor tells you to stop. In some cases, you may be given additional medicines to help with side effects. Follow all directions for their use. Call your doctor or health care professional for advice if you get a fever, chills or sore throat, or other symptoms of a cold or flu. Do not treat yourself. This drug decreases your body's ability to fight infections. Try to avoid being around people who are sick. This medicine may increase your risk to bruise or bleed. Call your doctor or health care professional if you notice any unusual bleeding. Be careful brushing and flossing your teeth or using a toothpick because you may get an infection or bleed more easily. If you have any dental work  done, tell your dentist you are receiving this medicine. Avoid taking products that contain aspirin, acetaminophen, ibuprofen, naproxen, or ketoprofen unless instructed by your doctor. These medicines may hide a fever. Call your doctor or health care professional if you get diarrhea or mouth sores. Do not treat yourself. To protect your kidneys, drink water or other fluids as directed while you are taking this medicine. Do not become pregnant while taking this medicine or for 6 months after stopping it. Women should inform their doctor if they wish to become pregnant or think they might be pregnant. Men should not father a child while taking this medicine and for 3 months after stopping it. This may interfere with the ability to father a child. You should talk  to your doctor or health care professional if you are concerned about your fertility. There is a potential for serious side effects to an unborn child. Talk to your health care professional or pharmacist for more information. Do not breast-feed an infant while taking this medicine or for 1 week after stopping it. What side effects may I notice from receiving this medication? Side effects that you should report to your doctor or health care professional as soon as possible: allergic reactions like skin rash, itching or hives, swelling of the face, lips, or tongue breathing problems redness, blistering, peeling or loosening of the skin, including inside the mouth signs and symptoms of bleeding such as bloody or black, tarry stools; red or dark-brown urine; spitting up blood or brown material that looks like coffee grounds; red spots on the skin; unusual bruising or bleeding from the eye, gums, or nose signs and symptoms of infection like fever or chills; cough; sore throat; pain or trouble passing urine signs and symptoms of kidney injury like trouble passing urine or change in the amount of urine signs and symptoms of liver injury like dark yellow or brown urine; general ill feeling or flu-like symptoms; light-colored stools; loss of appetite; nausea; right upper belly pain; unusually weak or tired; yellowing of the eyes or skin Side effects that usually do not require medical attention (report to your doctor or health care professional if they continue or are bothersome): constipation mouth sores nausea, vomiting unusually weak or tired This list may not describe all possible side effects. Call your doctor for medical advice about side effects. You may report side effects to FDA at 1-800-FDA-1088. Where should I keep my medication? This drug is given in a hospital or clinic and will not be stored at home. NOTE: This sheet is a summary. It may not cover all possible information. If you have  questions about this medicine, talk to your doctor, pharmacist, or health care provider.  2022 Elsevier/Gold Standard (2017-12-12 16:11:33)  Carboplatin injection What is this medication? CARBOPLATIN (KAR boe pla tin) is a chemotherapy drug. It targets fast dividing cells, like cancer cells, and causes these cells to die. This medicine is used to treat ovarian cancer and many other cancers. This medicine may be used for other purposes; ask your health care provider or pharmacist if you have questions. COMMON BRAND NAME(S): Paraplatin What should I tell my care team before I take this medication? They need to know if you have any of these conditions: blood disorders hearing problems kidney disease recent or ongoing radiation therapy an unusual or allergic reaction to carboplatin, cisplatin, other chemotherapy, other medicines, foods, dyes, or preservatives pregnant or trying to get pregnant breast-feeding How should I use this medication? This drug is  usually given as an infusion into a vein. It is administered in a hospital or clinic by a specially trained health care professional. Talk to your pediatrician regarding the use of this medicine in children. Special care may be needed. Overdosage: If you think you have taken too much of this medicine contact a poison control center or emergency room at once. NOTE: This medicine is only for you. Do not share this medicine with others. What if I miss a dose? It is important not to miss a dose. Call your doctor or health care professional if you are unable to keep an appointment. What may interact with this medication? medicines for seizures medicines to increase blood counts like filgrastim, pegfilgrastim, sargramostim some antibiotics like amikacin, gentamicin, neomycin, streptomycin, tobramycin vaccines Talk to your doctor or health care professional before taking any of these  medicines: acetaminophen aspirin ibuprofen ketoprofen naproxen This list may not describe all possible interactions. Give your health care provider a list of all the medicines, herbs, non-prescription drugs, or dietary supplements you use. Also tell them if you smoke, drink alcohol, or use illegal drugs. Some items may interact with your medicine. What should I watch for while using this medication? Your condition will be monitored carefully while you are receiving this medicine. You will need important blood work done while you are taking this medicine. This drug may make you feel generally unwell. This is not uncommon, as chemotherapy can affect healthy cells as well as cancer cells. Report any side effects. Continue your course of treatment even though you feel ill unless your doctor tells you to stop. In some cases, you may be given additional medicines to help with side effects. Follow all directions for their use. Call your doctor or health care professional for advice if you get a fever, chills or sore throat, or other symptoms of a cold or flu. Do not treat yourself. This drug decreases your body's ability to fight infections. Try to avoid being around people who are sick. This medicine may increase your risk to bruise or bleed. Call your doctor or health care professional if you notice any unusual bleeding. Be careful brushing and flossing your teeth or using a toothpick because you may get an infection or bleed more easily. If you have any dental work done, tell your dentist you are receiving this medicine. Avoid taking products that contain aspirin, acetaminophen, ibuprofen, naproxen, or ketoprofen unless instructed by your doctor. These medicines may hide a fever. Do not become pregnant while taking this medicine. Women should inform their doctor if they wish to become pregnant or think they might be pregnant. There is a potential for serious side effects to an unborn child. Talk to your  health care professional or pharmacist for more information. Do not breast-feed an infant while taking this medicine. What side effects may I notice from receiving this medication? Side effects that you should report to your doctor or health care professional as soon as possible: allergic reactions like skin rash, itching or hives, swelling of the face, lips, or tongue signs of infection - fever or chills, cough, sore throat, pain or difficulty passing urine signs of decreased platelets or bleeding - bruising, pinpoint red spots on the skin, black, tarry stools, nosebleeds signs of decreased red blood cells - unusually weak or tired, fainting spells, lightheadedness breathing problems changes in hearing changes in vision chest pain high blood pressure low blood counts - This drug may decrease the number of white blood cells, red  blood cells and platelets. You may be at increased risk for infections and bleeding. nausea and vomiting pain, swelling, redness or irritation at the injection site pain, tingling, numbness in the hands or feet problems with balance, talking, walking trouble passing urine or change in the amount of urine Side effects that usually do not require medical attention (report to your doctor or health care professional if they continue or are bothersome): hair loss loss of appetite metallic taste in the mouth or changes in taste This list may not describe all possible side effects. Call your doctor for medical advice about side effects. You may report side effects to FDA at 1-800-FDA-1088. Where should I keep my medication? This drug is given in a hospital or clinic and will not be stored at home. NOTE: This sheet is a summary. It may not cover all possible information. If you have questions about this medicine, talk to your doctor, pharmacist, or health care provider.  2022 Elsevier/Gold Standard (2008-01-28 14:38:05)

## 2021-08-31 NOTE — Progress Notes (Signed)
Patient states she is doing well. No symptoms reported at this time.  Meaningful use complete.  Patient notified of 11:00am-10/226/22 and verbalized understanding.

## 2021-09-01 ENCOUNTER — Telehealth: Payer: Self-pay | Admitting: Urology

## 2021-09-01 ENCOUNTER — Ambulatory Visit (INDEPENDENT_AMBULATORY_CARE_PROVIDER_SITE_OTHER): Payer: Medicare HMO | Admitting: Pulmonary Disease

## 2021-09-01 ENCOUNTER — Encounter: Payer: Self-pay | Admitting: Pulmonary Disease

## 2021-09-01 VITALS — BP 114/52 | HR 70 | Temp 98.1°F | Ht 67.0 in | Wt 137.0 lb

## 2021-09-01 DIAGNOSIS — R69 Illness, unspecified: Secondary | ICD-10-CM | POA: Diagnosis not present

## 2021-09-01 DIAGNOSIS — J449 Chronic obstructive pulmonary disease, unspecified: Secondary | ICD-10-CM

## 2021-09-01 DIAGNOSIS — C3412 Malignant neoplasm of upper lobe, left bronchus or lung: Secondary | ICD-10-CM

## 2021-09-01 DIAGNOSIS — C7931 Secondary malignant neoplasm of brain: Secondary | ICD-10-CM | POA: Diagnosis not present

## 2021-09-01 DIAGNOSIS — F17200 Nicotine dependence, unspecified, uncomplicated: Secondary | ICD-10-CM

## 2021-09-01 DIAGNOSIS — C3490 Malignant neoplasm of unspecified part of unspecified bronchus or lung: Secondary | ICD-10-CM

## 2021-09-01 NOTE — Progress Notes (Signed)
Synopsis: Referred in July 2022 for abnormal ct chest by [name], MD  Subjective:   PATIENT ID: Jill [name] GENDER: female DOB: [birth year], MRN: [id number]  Chief Complaint  Patient presents with   Follow-up     This is a 70 year old female, current smoker, since age 79, currently smoking 1 pack/day.  She has medical history of hypertension, hyperlipidemia, UTI, coronary artery disease, MI, stent to the RCA with CABG in 2008.  She presents today after having a lung cancer screening CT. this was completed on 04/28/2021.  Lung cancer screening CT revealed a large 3.1 cm left upper lobe mass with associated mediastinal adenopathy however small in size.  This was concerning for primary bronchogenic carcinoma.  Patient was referred for evaluation.  From respiratory standpoint she is still smoking.  She does have cough and sputum production.  She is not that significantly breathless but is able to complete her activities of daily living.  OV 06/02/2021: Here today for follow-up after recent bronchoscopy.  New tissue diagnosis of adenocarcinoma.  Recent PET scan concerning for stage III lung cancer.  Her station 7 lymph node was negative.  Paratracheal node not sampled during bronchoscopy.  Currently not using any inhalers. Unfortunately she is still smoking.  We talked about smoking cessation today in the office.  OV 09/01/2021: doing well today. On chemo. Had brain radiation already.  Overall from respiratory standpoint patient doing well.  She is here just to check and then say hello.  Thankful for the care that she is received thus far.  Maintains at this point not on any inhalers.  We talked about potentially going on these if it would help with her symptoms.We also talked about smoking cessation.  She is trying to quit.  Unfortunately when she gets depressed she feels like she wants to smoke more.    Past Medical History:  Diagnosis Date   Anxiety    ON PAXIL, XANAX   AVM  (arteriovenous malformation) brain    s/p stent/coil   Blood transfusion    x 2   Cancer (HCC)    Left lung   COPD (chronic obstructive pulmonary disease) (HCC)    no inhaler, no oxygen   Coronary artery disease    Prior inferior MI with stent to RCA, s/p CABG in 2008   Depression    Dizziness    Dyspnea    with exertion, no oxygen   Fatigue    Foot injury 06/01/2017   right - RESOLVED, no longer an issue per patient 05/18/21   GERD (gastroesophageal reflux disease)    Headache(784.0)    UNRUPTURED CEREBRAL ANEURYSM   Hyperlipidemia    Hypertension    Hypothyroidism    Infected cyst of skin 09/18/2013   Left knee pain 06/05/2012   Lung mass    Left lung   Myocardial infarction ([place])    Normal nuclear stress test Ju;y 2012   No ischemia. EF 70%; fixed defect involving septum, inferoseptal and inferior wall   Pain, dental 02/06/2017   Poor dental hygiene    Retroperitoneal bleeding    Following cardiac cath   Tobacco abuse    Urine discoloration 09/18/2013   UTI (lower urinary tract infection) 10/16/2013     Family History  Problem Relation Age of Onset   Cancer Mother 68   Diabetes Mother    Alcohol abuse Father    Asthma Father    Diabetes Brother    Lung cancer Brother  Ovarian cancer Maternal Aunt    Liver cancer Paternal Aunt    Heart disease Neg Hx      Past Surgical History:  Procedure Laterality Date   CARDIAC CATHETERIZATION  01/02/2007   IT REVEALS MILD INFERIOR WALL HYPOKINESIS. THE EJECTION FRACTION IS AROUND 50%   COLONOSCOPY     CORONARY ARTERY BYPASS GRAFT  11/06/2006   LIMA to LAD, SVG to DX, SVG to LCX & SVG to OM 1 & 2, and SVG to PD   CORONARY STENT PLACEMENT     Remote past stent to RCA   EYE SURGERY Right    cataracts removed   HEMORRHOID SURGERY  11/07/1987   UPPER GI ENDOSCOPY     VENTRICULOSTOMY  10/06/2011   Procedure: VENTRICULOSTOMY;  Surgeon: Winfield Cunas;  Location: [facility];  Service: Neurosurgery;  Laterality:  Right;  Insertion of Ventriculostomy Catheter   VIDEO BRONCHOSCOPY WITH ENDOBRONCHIAL NAVIGATION Left 05/20/2021   Procedure: VIDEO BRONCHOSCOPY WITH ENDOBRONCHIAL NAVIGATION;  Surgeon: Garner Nash, DO;  Location: [facility];  Service: Pulmonary;  Laterality: Left;   VIDEO BRONCHOSCOPY WITH ENDOBRONCHIAL ULTRASOUND Bilateral 05/20/2021   Procedure: VIDEO BRONCHOSCOPY WITH ENDOBRONCHIAL ULTRASOUND;  Surgeon: Garner Nash, DO;  Location: [facility];  Service: Pulmonary;  Laterality: Bilateral;   WISDOM TOOTH EXTRACTION      Social History   Socioeconomic History   Marital status: Married    Spouse name: Not on file   Number of children: Not on file   Years of education: Not on file   Highest education level: Not on file  Occupational History   Not on file  Tobacco Use   Smoking status: Every Day    Packs/day: 1.50    Years: 58.00    Pack years: 87.00    Types: Cigarettes    Start date: 11/06/1957   Smokeless tobacco: Never   Tobacco comments:    Has smoked 3 ppd, current 1.5-2 ppd  Vaping Use   Vaping Use: Never used  Substance and Sexual Activity   Alcohol use: No   Drug use: No   Sexual activity: Yes    Birth control/protection: Post-menopausal  Other Topics Concern   Not on file  Social History Writer.  Some college.  Currently retired.  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 worked in [employer] most recently.      Lives with husband and 2 adult daughters and 4 grandchildren.   Social Determinants of Health   Financial Resource Strain: Not on file  Food Insecurity: Not on file  Transportation Needs: Not on file  Physical Activity: Not on file  Stress: Not on file  Social Connections: Not on file  Intimate Partner Violence: Not on file     Allergies  Allergen Reactions   Varenicline Other (See Comments)    Reports change in mood with increased irritability and homicidal ideation.  2007    Ace Inhibitors Cough   Prednisone Other  (See Comments)    Crying, disorientation - occurred in 1994 Rage   Penicillins Rash    Reaction: Childhood   Sulfa Drugs Cross Reactors Rash     Outpatient Medications Prior to Visit  Medication Sig Dispense Refill   amLODipine (NORVASC) 10 MG tablet Take 1 tablet by mouth once daily 90 tablet 1   aspirin EC 81 MG tablet Take 1 tablet (81 mg total) by mouth daily. 90 tablet 2   atorvastatin (LIPITOR) 40 MG tablet Take 1  tablet by mouth once daily 90 tablet 1   Cholecalciferol (VITAMIN D3) 250 MCG (10000 UT) capsule Take 10,000 mcg by mouth daily.     diphenhydramine-acetaminophen (TYLENOL PM) 25-500 MG TABS tablet Take 1 tablet by mouth every other day. At bedtime     escitalopram (LEXAPRO) 10 MG tablet Take 1 tablet by mouth once daily 90 tablet 1   folic acid (FOLVITE) 1 MG tablet Take 1 tablet (1 mg total) by mouth daily. 30 tablet 4   levothyroxine (SYNTHROID) 137 MCG tablet Take 1 tablet by mouth once daily 90 tablet 1   losartan (COZAAR) 100 MG tablet Take 1 tablet by mouth once daily 90 tablet 1   metoprolol succinate (TOPROL-XL) 50 MG 24 hr tablet TAKE 1 TABLET BY MOUTH ONCE DAILY WITH MEALS OR  IMMEDIATELY  FOLLOWING 90 tablet 1   Multiple Vitamins-Minerals (MULTIVITAMIN WITH MINERALS) tablet Take 1 tablet by mouth daily.     nitroGLYCERIN (NITROSTAT) 0.4 MG SL tablet Place 1 tablet (0.4 mg total) under the tongue every 5 (five) minutes as needed. For chest pain 25 tablet 6   Omega-3 Fatty Acids (FISH OIL) 1000 MG CAPS Take 2 capsules (2,000 mg total) by mouth in the morning and at bedtime. 120 capsule 1   omeprazole (PRILOSEC) 20 MG capsule Take 1 capsule (20 mg total) by mouth daily. Prolonged use may damage the kidney 90 capsule 1   prochlorperazine (COMPAZINE) 10 MG tablet Take 1 tablet (10 mg total) by mouth every 6 (six) hours as needed for nausea or vomiting. 30 tablet 0   Vitamin A 2400 MCG (8000 UT) TABS Take 2,400 mg by mouth daily.     vitamin B-12 (CYANOCOBALAMIN) 1000  MCG tablet Take 1,000 mcg by mouth daily.     No facility-administered medications prior to visit.    Review of Systems  Constitutional:  Positive for malaise/fatigue. Negative for chills, fever and weight loss.  HENT:  Negative for hearing loss, sore throat and tinnitus.   Eyes:  Negative for blurred vision and double vision.  Respiratory:  Positive for cough and shortness of breath. Negative for hemoptysis, sputum production, wheezing and stridor.   Cardiovascular:  Negative for chest pain, palpitations, orthopnea, leg swelling and PND.  Gastrointestinal:  Negative for abdominal pain, constipation, diarrhea, heartburn, nausea and vomiting.  Genitourinary:  Negative for dysuria, hematuria and urgency.  Musculoskeletal:  Negative for joint pain and myalgias.  Skin:  Negative for itching and rash.  Neurological:  Negative for dizziness, tingling, weakness and headaches.  Endo/Heme/Allergies:  Negative for environmental allergies. Does not bruise/bleed easily.  Psychiatric/Behavioral:  Positive for depression. The patient is not nervous/anxious and does not have insomnia.   All other systems reviewed and are negative.   Objective:  Physical Exam Vitals reviewed.  Constitutional:      General: She is not in acute distress.    Appearance: She is well-developed.     Comments: Thin, , diminished breath sounds, low BMI  HENT:     Head: Normocephalic and atraumatic.     Mouth/Throat:     Pharynx: No oropharyngeal exudate.  Eyes:     Conjunctiva/sclera: Conjunctivae normal.     Pupils: Pupils are equal, round, and reactive to light.  Neck:     Vascular: No JVD.     Trachea: No tracheal deviation.     Comments: Loss of supraclavicular fat Cardiovascular:     Rate and Rhythm: Normal rate and regular rhythm.     Heart  sounds: S1 normal and S2 normal.     Comments: Distant heart tones Pulmonary:     Effort: No tachypnea or accessory muscle usage.     Breath sounds: No stridor.  Decreased breath sounds (throughout all lung fields) present. No wheezing, rhonchi or rales.  Abdominal:     General: Bowel sounds are normal. There is no distension.     Palpations: Abdomen is soft.     Tenderness: There is no abdominal tenderness.  Musculoskeletal:        General: Deformity (muscle wasting ) present.  Skin:    General: Skin is warm and dry.     Capillary Refill: Capillary refill takes less than 2 seconds.     Findings: No rash.  Neurological:     Mental Status: She is alert and oriented to person, place, and time.  Psychiatric:        Behavior: Behavior normal.     Vitals:   09/01/21 1628  BP: (!) 114/52  Pulse: 70  Temp: 98.1 F (36.7 C)  TempSrc: Oral  SpO2: 95%  Weight: 137 lb (62.1 kg)  Height: 5\' 7"  (1.702 m)   95% on RA BMI Readings from Last 3 Encounters:  09/01/21 21.46 kg/m  08/31/21 21.05 kg/m  08/24/21 20.99 kg/m   Wt Readings from Last 3 Encounters:  09/01/21 137 lb (62.1 kg)  08/31/21 134 lb 6.4 oz (61 kg)  08/24/21 134 lb (60.8 kg)     CBC    Component Value Date/Time   WBC 5.0 08/31/2021 0800   WBC 11.0 (H) 06/27/2021 0817   RBC 3.58 (L) 08/31/2021 0800   HGB 11.9 (L) 08/31/2021 0800   HGB 13.6 04/13/2021 0944   HCT 36.0 08/31/2021 0800   HCT 40.8 04/13/2021 0944   PLT 252 08/31/2021 0800   PLT 328 04/13/2021 0944   MCV 100.6 (H) 08/31/2021 0800   MCV 93 04/13/2021 0944   MCH 33.2 08/31/2021 0800   MCHC 33.1 08/31/2021 0800   RDW 15.3 08/31/2021 0800   RDW 11.8 04/13/2021 0944   LYMPHSABS 1.1 08/31/2021 0800   LYMPHSABS 2.8 04/13/2021 0944   MONOABS 0.6 08/31/2021 0800   EOSABS 0.1 08/31/2021 0800   EOSABS 0.2 04/13/2021 0944   BASOSABS 0.1 08/31/2021 0800   BASOSABS 0.1 04/13/2021 0944    Chest Imaging: 04/28/2021 lung cancer screening CT: 3 cm left upper lobe mass concerning for primary bronchogenic carcinoma. The patient's images have been independently reviewed by me.    Nuclear medicine pet imaging July  2022: PET avid left upper lobe lesion with associated small area in the left hilum and an enlarged paratracheal node that is PET avid. Concerning for stage III malignancy. The patient's images have been independently reviewed by me.    Pulmonary Functions Testing Results: No flowsheet data found.  FeNO:   Pathology:   Echocardiogram:   Heart Catheterization:     Assessment & Plan:     ICD-10-CM   1. Primary adenocarcinoma of left upper lobe of lung ([place])  C34.12     2. Primary malignant neoplasm of lung with metastasis to brain (HCC)  C34.90    C79.31     3. Current smoker  F17.200     4. Chronic obstructive pulmonary disease, unspecified COPD type ([place])  J44.9       Discussion:  This is a 70 year old female, current smoker, left upper lobe 3 cm lung mass, primary bronchogenic carcinoma, recent bronchoscopy with tissue sampling, positive for adenocarcinoma of  the lung.  Stage IV adenocarcinoma with brain metastasis.  Plan: Continue follow-up with medical oncology We discussed smoking cessation today At some point she may benefit from being on inhaler. Not at this point because she does not really want to add anything else to her medication regimen. Physical follow-up with [place] as needed or in the next 6 months.    Current Outpatient Medications:    amLODipine (NORVASC) 10 MG tablet, Take 1 tablet by mouth once daily, Disp: 90 tablet, Rfl: 1   aspirin EC 81 MG tablet, Take 1 tablet (81 mg total) by mouth daily., Disp: 90 tablet, Rfl: 2   atorvastatin (LIPITOR) 40 MG tablet, Take 1 tablet by mouth once daily, Disp: 90 tablet, Rfl: 1   Cholecalciferol (VITAMIN D3) 250 MCG (10000 UT) capsule, Take 10,000 mcg by mouth daily., Disp: , Rfl:    diphenhydramine-acetaminophen (TYLENOL PM) 25-500 MG TABS tablet, Take 1 tablet by mouth every other day. At bedtime, Disp: , Rfl:    escitalopram (LEXAPRO) 10 MG tablet, Take 1 tablet by mouth once daily, Disp: 90 tablet, Rfl: 1    folic acid (FOLVITE) 1 MG tablet, Take 1 tablet (1 mg total) by mouth daily., Disp: 30 tablet, Rfl: 4   levothyroxine (SYNTHROID) 137 MCG tablet, Take 1 tablet by mouth once daily, Disp: 90 tablet, Rfl: 1   losartan (COZAAR) 100 MG tablet, Take 1 tablet by mouth once daily, Disp: 90 tablet, Rfl: 1   metoprolol succinate (TOPROL-XL) 50 MG 24 hr tablet, TAKE 1 TABLET BY MOUTH ONCE DAILY WITH MEALS OR  IMMEDIATELY  FOLLOWING, Disp: 90 tablet, Rfl: 1   Multiple Vitamins-Minerals (MULTIVITAMIN WITH MINERALS) tablet, Take 1 tablet by mouth daily., Disp: , Rfl:    nitroGLYCERIN (NITROSTAT) 0.4 MG SL tablet, Place 1 tablet (0.4 mg total) under the tongue every 5 (five) minutes as needed. For chest pain, Disp: 25 tablet, Rfl: 6   Omega-3 Fatty Acids (FISH OIL) 1000 MG CAPS, Take 2 capsules (2,000 mg total) by mouth in the morning and at bedtime., Disp: 120 capsule, Rfl: 1   omeprazole (PRILOSEC) 20 MG capsule, Take 1 capsule (20 mg total) by mouth daily. Prolonged use may damage the kidney, Disp: 90 capsule, Rfl: 1   prochlorperazine (COMPAZINE) 10 MG tablet, Take 1 tablet (10 mg total) by mouth every 6 (six) hours as needed for nausea or vomiting., Disp: 30 tablet, Rfl: 0   Vitamin A 2400 MCG (8000 UT) TABS, Take 2,400 mg by mouth daily., Disp: , Rfl:    vitamin B-12 (CYANOCOBALAMIN) 1000 MCG tablet, Take 1,000 mcg by mouth daily., Disp: , Rfl:    Garner Nash, DO [facility] 09/01/2021 4:55 PM

## 2021-09-01 NOTE — Telephone Encounter (Signed)
[name] returned my call and I was able to connect with her by telephone this morning to review the plan for upcoming repeat MRI brain scan in November 2022 as documented in her posttreatment note from 08/31/2021.  She reports that in general, she is feeling well and has no complaints associated with the radiation.  She denies dysphagia and reports that she has a healthy appetite and is maintaining her weight.  She is quite pleased with her progress to date and is comfortable and in agreement with the plan as stated in her posttreatment note.  She knows that she is welcome to call at anytime in the interim with any questions or concerns.  Otherwise, we will plan to connect by telephone again following her upcoming MRI brain scan in November 2022.  Nicholos Johns, MMS, PA-C [facility]: [phone]  Fax: [phone]

## 2021-09-01 NOTE — Patient Instructions (Signed)
Thank you for visiting Dr. Valeta Harms at [facility]. Today we recommend the following:  Return in about 6 months (around 03/02/2022) for w/ Dr. Valeta Harms .    Please do your part to reduce the spread of COVID-19.

## 2021-09-06 ENCOUNTER — Inpatient Hospital Stay: Payer: Medicare HMO | Attending: Internal Medicine

## 2021-09-06 ENCOUNTER — Other Ambulatory Visit: Payer: Self-pay

## 2021-09-06 DIAGNOSIS — Z87440 Personal history of urinary (tract) infections: Secondary | ICD-10-CM | POA: Diagnosis not present

## 2021-09-06 DIAGNOSIS — K0889 Other specified disorders of teeth and supporting structures: Secondary | ICD-10-CM | POA: Insufficient documentation

## 2021-09-06 DIAGNOSIS — C7931 Secondary malignant neoplasm of brain: Secondary | ICD-10-CM | POA: Insufficient documentation

## 2021-09-06 DIAGNOSIS — R0982 Postnasal drip: Secondary | ICD-10-CM | POA: Diagnosis not present

## 2021-09-06 DIAGNOSIS — Z882 Allergy status to sulfonamides status: Secondary | ICD-10-CM | POA: Insufficient documentation

## 2021-09-06 DIAGNOSIS — R059 Cough, unspecified: Secondary | ICD-10-CM | POA: Insufficient documentation

## 2021-09-06 DIAGNOSIS — E785 Hyperlipidemia, unspecified: Secondary | ICD-10-CM | POA: Diagnosis not present

## 2021-09-06 DIAGNOSIS — I252 Old myocardial infarction: Secondary | ICD-10-CM | POA: Insufficient documentation

## 2021-09-06 DIAGNOSIS — Z923 Personal history of irradiation: Secondary | ICD-10-CM | POA: Insufficient documentation

## 2021-09-06 DIAGNOSIS — C3412 Malignant neoplasm of upper lobe, left bronchus or lung: Secondary | ICD-10-CM | POA: Diagnosis not present

## 2021-09-06 DIAGNOSIS — K5900 Constipation, unspecified: Secondary | ICD-10-CM | POA: Insufficient documentation

## 2021-09-06 DIAGNOSIS — R058 Other specified cough: Secondary | ICD-10-CM | POA: Diagnosis not present

## 2021-09-06 DIAGNOSIS — Z9221 Personal history of antineoplastic chemotherapy: Secondary | ICD-10-CM | POA: Insufficient documentation

## 2021-09-06 DIAGNOSIS — R609 Edema, unspecified: Secondary | ICD-10-CM | POA: Insufficient documentation

## 2021-09-06 DIAGNOSIS — Z7952 Long term (current) use of systemic steroids: Secondary | ICD-10-CM | POA: Insufficient documentation

## 2021-09-06 DIAGNOSIS — Z79899 Other long term (current) drug therapy: Secondary | ICD-10-CM | POA: Diagnosis not present

## 2021-09-06 DIAGNOSIS — Z5112 Encounter for antineoplastic immunotherapy: Secondary | ICD-10-CM | POA: Diagnosis not present

## 2021-09-06 DIAGNOSIS — Z888 Allergy status to other drugs, medicaments and biological substances status: Secondary | ICD-10-CM | POA: Insufficient documentation

## 2021-09-06 DIAGNOSIS — Z8719 Personal history of other diseases of the digestive system: Secondary | ICD-10-CM | POA: Diagnosis not present

## 2021-09-06 DIAGNOSIS — E039 Hypothyroidism, unspecified: Secondary | ICD-10-CM | POA: Diagnosis not present

## 2021-09-06 DIAGNOSIS — R5383 Other fatigue: Secondary | ICD-10-CM | POA: Diagnosis not present

## 2021-09-06 DIAGNOSIS — R590 Localized enlarged lymph nodes: Secondary | ICD-10-CM | POA: Diagnosis not present

## 2021-09-06 DIAGNOSIS — Z5111 Encounter for antineoplastic chemotherapy: Secondary | ICD-10-CM | POA: Diagnosis not present

## 2021-09-06 DIAGNOSIS — J449 Chronic obstructive pulmonary disease, unspecified: Secondary | ICD-10-CM | POA: Insufficient documentation

## 2021-09-06 DIAGNOSIS — R0981 Nasal congestion: Secondary | ICD-10-CM | POA: Insufficient documentation

## 2021-09-06 DIAGNOSIS — C779 Secondary and unspecified malignant neoplasm of lymph node, unspecified: Secondary | ICD-10-CM | POA: Diagnosis not present

## 2021-09-06 DIAGNOSIS — Z880 Allergy status to penicillin: Secondary | ICD-10-CM | POA: Insufficient documentation

## 2021-09-06 DIAGNOSIS — I700 Atherosclerosis of aorta: Secondary | ICD-10-CM | POA: Insufficient documentation

## 2021-09-06 LAB — CMP (CANCER CENTER ONLY)
ALT: 16 U/L (ref 0–44)
AST: 17 U/L (ref 15–41)
Albumin: 3.4 g/dL — ABNORMAL LOW (ref 3.5–5.0)
Alkaline Phosphatase: 76 U/L (ref 38–126)
Anion gap: 6 (ref 5–15)
BUN: 12 mg/dL (ref 8–23)
CO2: 30 mmol/L (ref 22–32)
Calcium: 8.7 mg/dL — ABNORMAL LOW (ref 8.9–10.3)
Chloride: 103 mmol/L (ref 98–111)
Creatinine: 0.83 mg/dL (ref 0.44–1.00)
GFR, Estimated: >60 mL/min (ref >60)
Glucose, Bld: 97 mg/dL (ref 70–99)
Potassium: 4.1 mmol/L (ref 3.5–5.1)
Sodium: 139 mmol/L (ref 135–145)
Total Bilirubin: 0.4 mg/dL (ref 0.3–1.2)
Total Protein: 6.3 g/dL — ABNORMAL LOW (ref 6.5–8.1)

## 2021-09-06 LAB — CBC WITH DIFFERENTIAL (CANCER CENTER ONLY)
Abs Immature Granulocytes: 0.01 10*3/uL (ref 0.00–0.07)
Basophils Absolute: 0 10*3/uL (ref 0.0–0.1)
Basophils Relative: 1 %
Eosinophils Absolute: 0.3 10*3/uL (ref 0.0–0.5)
Eosinophils Relative: 9 %
HCT: 32.3 % — ABNORMAL LOW (ref 36.0–46.0)
Hemoglobin: 11 g/dL — ABNORMAL LOW (ref 12.0–15.0)
Immature Granulocytes: 0 %
Lymphocytes Relative: 21 %
Lymphs Abs: 0.6 10*3/uL — ABNORMAL LOW (ref 0.7–4.0)
MCH: 34.2 pg — ABNORMAL HIGH (ref 26.0–34.0)
MCHC: 34.1 g/dL (ref 30.0–36.0)
MCV: 100.3 fL — ABNORMAL HIGH (ref 80.0–100.0)
Monocytes Absolute: 0.2 10*3/uL (ref 0.1–1.0)
Monocytes Relative: 6 %
Neutro Abs: 1.9 10*3/uL (ref 1.7–7.7)
Neutrophils Relative %: 63 %
Platelet Count: 212 10*3/uL (ref 150–400)
RBC: 3.22 MIL/uL — ABNORMAL LOW (ref 3.87–5.11)
RDW: 14.7 % (ref 11.5–15.5)
WBC Count: 3.1 10*3/uL — ABNORMAL LOW (ref 4.0–10.5)
nRBC: 0 % (ref 0.0–0.2)

## 2021-09-06 LAB — TSH: TSH: 1.385 u[IU]/mL (ref 0.308–3.960)

## 2021-09-07 ENCOUNTER — Other Ambulatory Visit: Payer: Medicare HMO

## 2021-09-13 ENCOUNTER — Other Ambulatory Visit: Payer: Self-pay | Admitting: Medical Oncology

## 2021-09-13 ENCOUNTER — Telehealth: Payer: Self-pay | Admitting: Medical Oncology

## 2021-09-13 ENCOUNTER — Inpatient Hospital Stay: Payer: Medicare HMO

## 2021-09-13 ENCOUNTER — Other Ambulatory Visit: Payer: Self-pay

## 2021-09-13 DIAGNOSIS — K5900 Constipation, unspecified: Secondary | ICD-10-CM | POA: Diagnosis not present

## 2021-09-13 DIAGNOSIS — C3412 Malignant neoplasm of upper lobe, left bronchus or lung: Secondary | ICD-10-CM | POA: Diagnosis not present

## 2021-09-13 DIAGNOSIS — R0981 Nasal congestion: Secondary | ICD-10-CM | POA: Diagnosis not present

## 2021-09-13 DIAGNOSIS — Z5112 Encounter for antineoplastic immunotherapy: Secondary | ICD-10-CM | POA: Diagnosis not present

## 2021-09-13 DIAGNOSIS — Z5111 Encounter for antineoplastic chemotherapy: Secondary | ICD-10-CM | POA: Diagnosis not present

## 2021-09-13 DIAGNOSIS — R058 Other specified cough: Secondary | ICD-10-CM | POA: Diagnosis not present

## 2021-09-13 DIAGNOSIS — C7931 Secondary malignant neoplasm of brain: Secondary | ICD-10-CM | POA: Diagnosis not present

## 2021-09-13 DIAGNOSIS — K0889 Other specified disorders of teeth and supporting structures: Secondary | ICD-10-CM

## 2021-09-13 DIAGNOSIS — R0982 Postnasal drip: Secondary | ICD-10-CM | POA: Diagnosis not present

## 2021-09-13 DIAGNOSIS — C779 Secondary and unspecified malignant neoplasm of lymph node, unspecified: Secondary | ICD-10-CM | POA: Diagnosis not present

## 2021-09-13 LAB — CBC WITH DIFFERENTIAL (CANCER CENTER ONLY)
Abs Immature Granulocytes: 0.01 10*3/uL (ref 0.00–0.07)
Basophils Absolute: 0 10*3/uL (ref 0.0–0.1)
Basophils Relative: 1 %
Eosinophils Absolute: 0.3 10*3/uL (ref 0.0–0.5)
Eosinophils Relative: 6 %
HCT: 32.5 % — ABNORMAL LOW (ref 36.0–46.0)
Hemoglobin: 10.8 g/dL — ABNORMAL LOW (ref 12.0–15.0)
Immature Granulocytes: 0 %
Lymphocytes Relative: 29 %
Lymphs Abs: 1.2 10*3/uL (ref 0.7–4.0)
MCH: 33.8 pg (ref 26.0–34.0)
MCHC: 33.2 g/dL (ref 30.0–36.0)
MCV: 101.6 fL — ABNORMAL HIGH (ref 80.0–100.0)
Monocytes Absolute: 0.5 10*3/uL (ref 0.1–1.0)
Monocytes Relative: 12 %
Neutro Abs: 2.2 10*3/uL (ref 1.7–7.7)
Neutrophils Relative %: 52 %
Platelet Count: 159 10*3/uL (ref 150–400)
RBC: 3.2 MIL/uL — ABNORMAL LOW (ref 3.87–5.11)
RDW: 14.8 % (ref 11.5–15.5)
WBC Count: 4.3 10*3/uL (ref 4.0–10.5)
nRBC: 0 % (ref 0.0–0.2)

## 2021-09-13 LAB — CMP (CANCER CENTER ONLY)
ALT: 14 U/L (ref 0–44)
AST: 19 U/L (ref 15–41)
Albumin: 3.7 g/dL (ref 3.5–5.0)
Alkaline Phosphatase: 78 U/L (ref 38–126)
Anion gap: 8 (ref 5–15)
BUN: 10 mg/dL (ref 8–23)
CO2: 28 mmol/L (ref 22–32)
Calcium: 9.2 mg/dL (ref 8.9–10.3)
Chloride: 104 mmol/L (ref 98–111)
Creatinine: 0.82 mg/dL (ref 0.44–1.00)
GFR, Estimated: >60 mL/min (ref >60)
Glucose, Bld: 96 mg/dL (ref 70–99)
Potassium: 3.7 mmol/L (ref 3.5–5.1)
Sodium: 140 mmol/L (ref 135–145)
Total Bilirubin: 0.3 mg/dL (ref 0.3–1.2)
Total Protein: 7.1 g/dL (ref 6.5–8.1)

## 2021-09-13 NOTE — Telephone Encounter (Signed)
Dental pain-Pt coming for labs and called to report she thinks she has an abscess tooth. Treated for same tooth  one month  ago in ED. She does not have a dentist. Per Julien Nordmann pt referred to Dental Medicine. Lemoyne Benson [place], D.M.D. Phone: [phone] Fax: [phone]

## 2021-09-14 ENCOUNTER — Other Ambulatory Visit: Payer: Medicare HMO

## 2021-09-15 ENCOUNTER — Ambulatory Visit
Admission: RE | Admit: 2021-09-15 | Discharge: 2021-09-15 | Disposition: A | Discharge status: Home / Self Care | Payer: Medicare HMO | Source: Ambulatory Visit | Attending: Radiation Oncology | Admitting: Radiation Oncology

## 2021-09-15 ENCOUNTER — Other Ambulatory Visit: Payer: Self-pay

## 2021-09-15 DIAGNOSIS — C7931 Secondary malignant neoplasm of brain: Secondary | ICD-10-CM | POA: Diagnosis not present

## 2021-09-15 DIAGNOSIS — C3490 Malignant neoplasm of unspecified part of unspecified bronchus or lung: Secondary | ICD-10-CM | POA: Diagnosis not present

## 2021-09-15 DIAGNOSIS — G936 Cerebral edema: Secondary | ICD-10-CM | POA: Diagnosis not present

## 2021-09-15 MED ORDER — GADOBENATE DIMEGLUMINE 529 MG/ML IV SOLN
12.0000 mL | Freq: Once | INTRAVENOUS | Status: AC | PRN
  Administered 2021-09-15: 12 mL via INTRAVENOUS

## 2021-09-15 NOTE — Progress Notes (Signed)
[facility] OFFICE PROGRESS NOTE  Jill Feil, MD [place] [address]  DIAGNOSIS: Stage IV (T2 a, N2, M1b) non-small cell lung cancer, adenocarcinoma presented with left upper lobe lung mass in addition to left hilar and precarinal metastatic adenopathy and small left lower lobe pulmonary nodule in addition to solitary left frontal brain metastasis diagnosed in July 2022.   Molecular studies by [facility] showed  Positive KRAS G12C mutation PD-L1 expression was less than 1%  PRIOR THERAPY: 1) Status post SRS to the solitary brain metastasis under the care of Dr. Tammi Klippel. Completed on 06/23/21 2)  Treatment for the locally advanced disease in the chest with carboplatin for AUC of 2 and paclitaxel 45 Mg/M2.  Status post 6 cycles.  Last dose was given 07/25/2021.  CURRENT THERAPY: Systemic chemotherapy with carboplatin for AUC of 5, Alimta 500 Mg/M2 and Keytruda 200 Mg IV every 3 weeks.  First dose August 31, 2021. Status post 1 cycle.   INTERVAL HISTORY: Jill [name] 70 y.o. female returns to the clinic today for a follow-up visit.  The patient is feeling fairly well today without any concerning complaints except for poor dentition, bleeding gums, and sinus pressure.  She has been having some issues with a with dental pain and concern for abscessed tooth.  She was referred to Dr. Benson Norway but does not have an appointment at this time. She had a local dentist that she has not seen since COVID-19 pandemic but did not like this practice and would like a new dentist. She has some dental pain/blister on her gums in her right upper jaw. She has been using salt water and this area has improved. No swelling or discharge. She also reports 2 weeks of frontal sinus pressure and nasal congestion. She does not like taking nasal spray. Her recent MRI notes some mild mucosal thickening in her sinuses. She has an associated cough due to post nasal drainage. She has  been taking delsym for cough. The patient denies any systemic symptoms such as fevers, chills, or night sweats. She denies facial swelling or erythema.   The patient is status post her first cycle of systemic chemotherapy and immunotherapy and she tolerated fairly well.  She is drinking Ensure. She used to take this 3-4x per day but recently reduced this to 1-2x per day. She reports mild dyspnea on exertion "sometimes". She has a baseline productive cough which produces phlegm.  She denies any chest pain or hemoptysis.  She denies any nausea, vomiting, or diarrhea.  She has mild constipation and will take milk of magnesia if needed.  Denies any headache or visual changes but had a recent repeat MRI of the brain which shows a new 3 mm and 1 mm brain lesion. She has an appointment with Ashlyn tomorrow to discuss. It looks like radiation oncology is setting her up for [facility]. Denies any rashes or skin changes.  He is here today for evaluation and repeat blood work before starting cycle #2.  MEDICAL HISTORY: Past Medical History:  Diagnosis Date   Anxiety    ON PAXIL, XANAX   AVM (arteriovenous malformation) brain    s/p stent/coil   Blood transfusion    x 2   Cancer (HCC)    Left lung   COPD (chronic obstructive pulmonary disease) (HCC)    no inhaler, no oxygen   Coronary artery disease    Prior inferior MI with stent to RCA, s/p CABG in 2008  Depression    Dizziness    Dyspnea    with exertion, no oxygen   Fatigue    Foot injury 06/01/2017   right - RESOLVED, no longer an issue per patient 05/18/21   GERD (gastroesophageal reflux disease)    Headache(784.0)    UNRUPTURED CEREBRAL ANEURYSM   Hyperlipidemia    Hypertension    Hypothyroidism    Infected cyst of skin 09/18/2013   Left knee pain 06/05/2012   Lung mass    Left lung   Myocardial infarction [facility])    Normal nuclear stress test Ju;y 2012   No ischemia. EF 70%; fixed defect involving septum, inferoseptal and inferior wall    Pain, dental 02/06/2017   Poor dental hygiene    Retroperitoneal bleeding    Following cardiac cath   Tobacco abuse    Urine discoloration 09/18/2013   UTI (lower urinary tract infection) 10/16/2013    ALLERGIES:  is allergic to varenicline, ace inhibitors, prednisone, penicillins, and sulfa drugs cross reactors.  MEDICATIONS:  Current Outpatient Medications  Medication Sig Dispense Refill   amLODipine (NORVASC) 10 MG tablet Take 1 tablet by mouth once daily 90 tablet 1   amoxicillin-clavulanate (AUGMENTIN) 875-125 MG tablet Take 1 tablet by mouth 2 (two) times daily. 12 tablet 0   aspirin EC 81 MG tablet Take 1 tablet (81 mg total) by mouth daily. 90 tablet 2   atorvastatin (LIPITOR) 40 MG tablet Take 1 tablet by mouth once daily 90 tablet 1   Cholecalciferol (VITAMIN D3) 250 MCG (10000 UT) capsule Take 10,000 mcg by mouth daily.     diphenhydramine-acetaminophen (TYLENOL PM) 25-500 MG TABS tablet Take 1 tablet by mouth every other day. At bedtime     escitalopram (LEXAPRO) 10 MG tablet Take 1 tablet by mouth once daily 90 tablet 1   folic acid (FOLVITE) 1 MG tablet Take 1 tablet (1 mg total) by mouth daily. 30 tablet 4   levothyroxine (SYNTHROID) 137 MCG tablet Take 1 tablet by mouth once daily 90 tablet 1   losartan (COZAAR) 100 MG tablet Take 1 tablet by mouth once daily 90 tablet 1   metoprolol succinate (TOPROL-XL) 50 MG 24 hr tablet TAKE 1 TABLET BY MOUTH ONCE DAILY WITH MEALS OR  IMMEDIATELY  FOLLOWING 90 tablet 1   Multiple Vitamins-Minerals (MULTIVITAMIN WITH MINERALS) tablet Take 1 tablet by mouth daily.     nitroGLYCERIN (NITROSTAT) 0.4 MG SL tablet Place 1 tablet (0.4 mg total) under the tongue every 5 (five) minutes as needed. For chest pain 25 tablet 6   Omega-3 Fatty Acids (FISH OIL) 1000 MG CAPS Take 2 capsules (2,000 mg total) by mouth in the morning and at bedtime. 120 capsule 1   omeprazole (PRILOSEC) 20 MG capsule Take 1 capsule (20 mg total) by mouth daily. Prolonged  use may damage the kidney 90 capsule 1   prochlorperazine (COMPAZINE) 10 MG tablet Take 1 tablet (10 mg total) by mouth every 6 (six) hours as needed for nausea or vomiting. 30 tablet 0   Vitamin A 2400 MCG (8000 UT) TABS Take 2,400 mg by mouth daily.     vitamin B-12 (CYANOCOBALAMIN) 1000 MCG tablet Take 1,000 mcg by mouth daily.     No current facility-administered medications for this visit.   Facility-Administered Medications Ordered in Other Visits  Medication Dose Route Frequency Provider Last Rate Last Admin   0.9 %  sodium chloride infusion   Intravenous Once Curt Bears, MD  CARBOplatin (PARAPLATIN) 370 mg in sodium chloride 0.9 % 100 mL chemo infusion  370 mg Intravenous Once Curt Bears, MD       dexamethasone (DECADRON) 10 mg in sodium chloride 0.9 % 50 mL IVPB  10 mg Intravenous Once Curt Bears, MD       fosaprepitant (EMEND) 150 mg in sodium chloride 0.9 % 145 mL IVPB  150 mg Intravenous Once Curt Bears, MD       heparin lock flush 100 unit/mL  500 Units Intracatheter Once PRN Curt Bears, MD       palonosetron (ALOXI) injection 0.25 mg  0.25 mg Intravenous Once Curt Bears, MD       pembrolizumab [facility]) 200 mg in sodium chloride 0.9 % 50 mL chemo infusion  200 mg Intravenous Once Curt Bears, MD       PEMEtrexed (ALIMTA) 900 mg in sodium chloride 0.9 % 100 mL chemo infusion  500 mg/m2 (Treatment Plan Recorded) Intravenous Once Curt Bears, MD       sodium chloride flush (NS) 0.9 % injection 10 mL  10 mL Intracatheter PRN Curt Bears, MD        SURGICAL HISTORY:  Past Surgical History:  Procedure Laterality Date   CARDIAC CATHETERIZATION  01/02/2007   IT REVEALS MILD INFERIOR WALL HYPOKINESIS. THE EJECTION FRACTION IS AROUND 50%   COLONOSCOPY     CORONARY ARTERY BYPASS GRAFT  11/06/2006   LIMA to LAD, SVG to DX, SVG to LCX & SVG to OM 1 & 2, and SVG to PD   CORONARY STENT PLACEMENT     Remote past stent to RCA   EYE  SURGERY Right    cataracts removed   HEMORRHOID SURGERY  11/07/1987   UPPER GI ENDOSCOPY     VENTRICULOSTOMY  10/06/2011   Procedure: VENTRICULOSTOMY;  Surgeon: Winfield Cunas;  Location: [facility];  Service: Neurosurgery;  Laterality: Right;  Insertion of Ventriculostomy Catheter   VIDEO BRONCHOSCOPY WITH ENDOBRONCHIAL NAVIGATION Left 05/20/2021   Procedure: VIDEO BRONCHOSCOPY WITH ENDOBRONCHIAL NAVIGATION;  Surgeon: Garner Nash, DO;  Location: [facility];  Service: Pulmonary;  Laterality: Left;   VIDEO BRONCHOSCOPY WITH ENDOBRONCHIAL ULTRASOUND Bilateral 05/20/2021   Procedure: VIDEO BRONCHOSCOPY WITH ENDOBRONCHIAL ULTRASOUND;  Surgeon: Garner Nash, DO;  Location: [facility];  Service: Pulmonary;  Laterality: Bilateral;   WISDOM TOOTH EXTRACTION      REVIEW OF SYSTEMS:   Constitutional: Positive for fatigue, decreased appetite, and weight loss. Negative for chills and fever. HENT: Negative for mouth sores, nosebleeds, sore throat and trouble swallowing.  Positive for gum lesion and poor dentition.  Eyes: Negative for eye problems and icterus.  Respiratory: Positive for baseline productive cough.  Positive for mild and occasional shortness of breath with exertion.  Negative for hemoptysis and wheezing.   Cardiovascular: Negative for chest pain and leg swelling.  Gastrointestinal: Positive for mild occasional constipation.  Negative for abdominal pain, diarrhea, nausea and vomiting.  Genitourinary: Negative for bladder incontinence, difficulty urinating, dysuria, frequency and hematuria.   Musculoskeletal: Negative for back pain, gait problem, neck pain and neck stiffness.  Skin: Negative for itching and rash.  Neurological: Negative for dizziness, extremity weakness, gait problem, headaches, light-headedness and seizures.  Hematological: Negative for adenopathy. Does not bruise/bleed easily.  Psychiatric/Behavioral: Negative for confusion, depression and sleep disturbance. The patient is  not nervous/anxious.    PHYSICAL EXAMINATION:  Blood pressure 130/64, pulse 74, temperature 97.8 F (36.6 C), temperature source Temporal, resp. rate 17, height _0  (1.702  m), weight 132 lb 9.6 oz (60.1 kg), SpO2 95 %.  ECOG PERFORMANCE STATUS: 1  Physical Exam  Constitutional: Oriented to person, place, and time and well-developed, well-nourished, and in no distress.   HENT:  Head: Normocephalic and atraumatic.  Mouth/Throat: Oropharynx is clear and moist. No oropharyngeal exudate. Poor dentition with multiple missing teeth and dental caries. Has a small lesion on her right upper gum line.  Eyes: Conjunctivae are normal. Right eye exhibits no discharge. Left eye exhibits no discharge. No scleral icterus.  Neck: Normal range of motion. Neck supple.  Cardiovascular: Normal rate, regular rhythm, normal heart sounds and intact distal pulses.   Pulmonary/Chest: Effort normal.  Decreased breath sounds in all lung fields.  No respiratory distress. No wheezes. No rales.  Abdominal: Soft. Bowel sounds are normal. Exhibits no distension and no mass. There is no tenderness.  Musculoskeletal: Normal range of motion. Exhibits no edema.  Lymphadenopathy:    No cervical adenopathy.  Neurological: Alert and oriented to person, place, and time. Exhibits muscle wasting. tone. Gait normal. Coordination normal.  Skin: Skin is warm and dry. No rash noted. Not diaphoretic. No erythema. No pallor.  Psychiatric: Mood, memory and judgment normal.  Vitals reviewed.  LABORATORY DATA: Lab Results  Component Value Date   WBC 4.1 09/20/2021   HGB 11.4 (L) 09/20/2021   HCT 34.9 (L) 09/20/2021   MCV 103.9 (H) 09/20/2021   PLT 278 09/20/2021      Chemistry      Component Value Date/Time   NA 141 09/20/2021 1041   NA 141 04/12/2021 1356   K 3.8 09/20/2021 1041   CL 106 09/20/2021 1041   CO2 27 09/20/2021 1041   BUN 12 09/20/2021 1041   BUN 13 04/12/2021 1356   CREATININE 0.86 09/20/2021 1041    CREATININE 0.99 08/29/2016 1130      Component Value Date/Time   CALCIUM 9.2 09/20/2021 1041   ALKPHOS 79 09/20/2021 1041   AST 17 09/20/2021 1041   ALT 13 09/20/2021 1041   BILITOT 0.2 (L) 09/20/2021 1041       RADIOGRAPHIC STUDIES:  CT Chest W Contrast  Result Date: 08/24/2021 CLINICAL DATA:  70 year old female with history of non-small cell lung cancer diagnosed in July 2022 status post chemotherapy and radiation therapy (completed in September). Follow-up study. EXAM: CT CHEST WITH CONTRAST TECHNIQUE: Multidetector CT imaging of the chest was performed during intravenous contrast administration. CONTRAST:  56m OMNIPAQUE IOHEXOL 350 MG/ML SOLN COMPARISON:  Chest CT 05/18/2021. FINDINGS: Cardiovascular: Heart size is normal. There is no significant pericardial fluid, thickening or pericardial calcification. There is aortic atherosclerosis, as well as atherosclerosis of the great vessels of the mediastinum and the coronary arteries, including calcified atherosclerotic plaque in the left main, left anterior descending, left circumflex and right coronary arteries. Status post median sternotomy for CABG including LIMA to the LAD. Mediastinum/Nodes: Several prominent borderline enlarged mediastinal and left hilar lymph nodes are noted, measuring up to 1 cm in short axis in the left hilar region (axial image 66 of series 2) and 1.1 cm in short axis in the low right paratracheal nodal station. Esophagus is unremarkable in appearance. No axillary lymphadenopathy. Lungs/Pleura: Partially cavitary macrolobulated mass with spiculated margins in the left upper lobe (axial image 61 of series 5 and coronal image 69 of series 6) measuring 3.1 x 2.6 x 2.1 cm appears smaller than the prior examination. Spiculations from this lesion extend laterally to the overlying pleura which is retracted toward the lesion,  and extend posteriorly to come in contact with the left major fissure. 7 x 6 mm left upper lobe pulmonary  nodule (axial image 52 of series 5), similar in retrospect to the prior study. 5 mm subpleural nodule in the anterior aspect of the left lower lobe abutting the major fissure (axial image 79 of series 5), stable. No other definite suspicious appearing pulmonary nodules or masses are noted. No acute consolidative airspace disease. No pleural effusions. Diffuse bronchial wall thickening with moderate centrilobular and paraseptal emphysema. Upper Abdomen: Aortic atherosclerosis. Low-attenuation lesions associated with the left kidney, largest of which is 2.7 cm in the anterior aspect of the interpolar region, compatible with simple cysts. Aortic atherosclerosis. Subcentimeter low-attenuation lesion in segment 7 of the liver, too small to characterize, but similar to the prior study and statistically likely to represent a tiny cyst. Musculoskeletal: Median sternotomy wires. There are no aggressive appearing lytic or blastic lesions noted in the visualized portions of the skeleton. IMPRESSION: 1. Today's study demonstrates slight decreased size of large partially cavitary macrolobulated and spiculated left upper lobe mass with borderline enlarged left hilar and low right paratracheal lymph nodes which were previously hypermetabolic on PET-CT 39/76/7341. Findings are compatible with at least T2a, N3, Mx disease (i.e., stage IIIB). 2. Small pulmonary nodules in the left upper lobe and left lower lobe are stable compared to the prior examination, as above. Continued attention on follow-up studies is recommended. 3. Aortic atherosclerosis, in addition to left main and 3 vessel coronary artery disease. Status post median sternotomy for CABG including LIMA to the LAD. 4. Diffuse bronchial wall thickening with moderate centrilobular and paraseptal emphysema; imaging findings suggestive of underlying COPD. Aortic Atherosclerosis (ICD10-I70.0) and Emphysema (ICD10-J43.9). Electronically Signed   By: Vinnie Langton M.D.   On:  08/24/2021 06:27   MR Brain W Wo Contrast  Result Date: 09/16/2021 CLINICAL DATA:  Brain/CNS neoplasm, surveillance. Follow-up on treated metastatic lung cancer. EXAM: MRI HEAD WITHOUT AND WITH CONTRAST TECHNIQUE: Multiplanar, multiecho pulse sequences of the brain and surrounding structures were obtained without and with intravenous contrast. CONTRAST:  54m MULTIHANCE GADOBENATE DIMEGLUMINE 529 MG/ML IV SOLN COMPARISON:  MR head 06/09/2021. FINDINGS: Brain: New 3 mm left occipital lesion with minimal edema (series 11, image 61). New punctate 1 mm lesion along the left temporal operculum (series 11, image 82). Stable 6 mm lesion of the left superior frontal gyrus. Increase in mild surrounding edema. Prior right frontal ventricular catheter tract is again identified with chronic blood products and gliosis. Stable additional patchy foci of T2 hyperintensity in the supratentorial white matter likely reflecting chronic microvascular ischemic changes. Unchanged mild dural enhancement. There is no acute infarction.  No hydrocephalus. Vascular: There is artifact at the level of treated basilar tip aneurysm. Major vessel flow voids at the skull base are preserved. Skull and upper cervical spine: Normal marrow signal is preserved. Sinuses/Orbits: Mild mucosal thickening.  Right lens replacement. Other: Sella is unremarkable.  Mastoid air cells are clear. IMPRESSION: New 3 mm left occipital and 1 mm left temporal metastases. Stable left frontal metastasis with increased mild edema. Electronically Signed   By: PMacy MisM.D.   On: 09/16/2021 10:00     ASSESSMENT/PLAN:  This is a very pleasant 70year old Caucasian female diagnosed with stage IV (T2 a, N2, M1 B) non-small cell lung cancer, adenocarcinoma.  She presented with a left upper lobe lung mass in addition to left hilar and precarinal metastatic adenopathy as well as a small left lower lobe  pulmonary nodule and a solitary left frontal brain metastasis.   This was diagnosed in July 2022.  Her molecular studies show that she is positive for K-ras G12C mutation and her PD-L1 expression is negative.  She completed SRS to the solitary brain metastasis under the care of Dr. Tammi Klippel which was completed on 06/23/2021.  She had a repeat brain MRI performed on 09/15/2021 which shows a new 1 mm and 3 mm metastatic brain lesions.  I briefly reviewed this with the patient but she has a dedicated appointment with radiation oncology tomorrow.  I discussed with the patient that it appears that they are getting her set up for SRS in the near future.  She completed concurrent chemoradiation for the locally advanced disease in the chest with carboplatin for an AUC of 2 and paclitaxel 45 mg per metered squared.  She is status post 6 cycles.  She had been tolerating treatment well without any concerning adverse side effects.   The patient is currently undergoing systemic chemotherapy with carboplatin AUC 5, Alimta 500 mg per metered squared, Keytruda 20 mg IV every 3 weeks.  She is status post 1 cycle.  She tolerated well without any concerning adverse side effects.  The patient was seen with Dr. Julien Nordmann today.  Labs were reviewed.   Dr. Julien Nordmann recommends proceed she proceed with cycle #2 today scheduled.  Regarding the poor dentition, we will follow-up on the referral to Dr. Benson Norway.  If she does not hear back from Dr. Everlene Other office, encouraged her to establish with another local dentist in the area as she is not interested in reestablishing care with her other dentist from 2020.  Overall, her dental pain from the right upper gum lesion has gotten smaller.  No systemic symptoms or redness, drainage, or swelling.  She will continue using salt water rinses.  She also has been having ongoing issues for over 2 weeks with stable but persistent right frontal sinus pressure and nasal congestion.  Her recent brain MRI showed mild sinus mucosal thickening.  We will treat her with  Augmentin twice daily for 6 days for possible sinusitis and protect from dental infection.  Per chart review, it appears that the patient has a reported allergy to penicillins.  The patient states that when she was a baby she had a rash to penicillin.  Per chart review it appears that the patient has received Augmentin several times in the last few years.  The patient states that she not have any adverse reactions to Augmentin at that time.  Regarding her bleeding gums, does not appear that the patient has had significant thrombocytopenia as an etiology of her dental bleeding, this is likely due to her poor dentition.   We will see her back for follow-up visit in 3 weeks for evaluation before starting cycle #3.  She was advised to use Mucinex as needed for her mucus production and take Delsym or Robitussin if needed.  She will follow-up with radiation oncology regarding SRS for the new metastatic brain lesions.  The patient was advised to call immediately if she has any concerning symptoms in the interval. The patient voices understanding of current disease status and treatment options and is in agreement with the current care plan. All questions were answered. The patient knows to call the clinic with any problems, questions or concerns. We can certainly see the patient much sooner if necessary    No orders of the defined types were placed in this encounter.  Mayjor Ager L Jaisen Wiltrout, PA-C 09/20/21  ADDENDUM: Hematology/Oncology Attending: I had a face-to-face encounter with the patient today.  I reviewed her records, lab and recommended her care plan.  This is a very pleasant 70 years old white female diagnosed with a stage IV non-small cell lung cancer, adenocarcinoma presented with left upper lobe lung mass in addition to left hilar and precarinal metastatic adenopathy as well as a small left lower lobe pulmonary nodule and solitary brain metastasis diagnosed in July 2022.  The  molecular studies showed positive KRAS G12C mutation and negative PD-L1 expression. The patient underwent SRS treatment to to the solitary brain metastasis. She also completed a course of concurrent chemoradiation to the locally advanced disease with weekly carboplatin and paclitaxel for 6 cycles and has been tolerating the treatment well. The patient is currently undergoing systemic chemotherapy with carboplatin for AUC of 5, Alimta 500 Mg/M2 and Keytruda 200 Mg IV every 3 weeks status post 1 cycle.  She tolerated the first cycle of her treatment well but she continues to complain of some dental issues with questionable infection as well as sinus problem.  She was referred to the dental service at [facility] but has not received an appointment with them yet. I recommended for the patient to proceed with cycle #2 today but will cover her with Augmentin because of concern about her infection from the poor dentition.  We also advised the the patient to call her primary dentist or call the dental office at [facility] for sooner evaluation. She will come back for follow-up visit in 3 weeks for evaluation before the next cycle of her treatment. The patient was advised to call immediately if she has any other concerning symptoms in the interval.  The total time spent in the appointment was 30 minutes. Disclaimer: This note was dictated with voice recognition software. Similar sounding words can inadvertently be transcribed and may be missed upon review. Eilleen Kempf, MD .t

## 2021-09-16 NOTE — Progress Notes (Signed)
Jill [name], please set-up [facility]

## 2021-09-19 ENCOUNTER — Inpatient Hospital Stay: Payer: Medicare HMO

## 2021-09-20 ENCOUNTER — Ambulatory Visit
Admission: RE | Admit: 2021-09-20 | Discharge: 2021-09-20 | Disposition: A | Discharge status: Home / Self Care | Payer: Medicare HMO | Source: Ambulatory Visit | Attending: Radiation Oncology | Admitting: Radiation Oncology

## 2021-09-20 ENCOUNTER — Telehealth (HOSPITAL_COMMUNITY): Payer: Self-pay

## 2021-09-20 ENCOUNTER — Other Ambulatory Visit: Payer: Self-pay

## 2021-09-20 ENCOUNTER — Inpatient Hospital Stay: Payer: Medicare HMO

## 2021-09-20 ENCOUNTER — Telehealth: Payer: Self-pay | Admitting: Urology

## 2021-09-20 ENCOUNTER — Inpatient Hospital Stay (HOSPITAL_BASED_OUTPATIENT_CLINIC_OR_DEPARTMENT_OTHER): Payer: Medicare HMO | Admitting: Physician Assistant

## 2021-09-20 VITALS — BP 130/64 | HR 74 | Temp 97.8°F | Resp 17 | Ht 67.0 in | Wt 132.6 lb

## 2021-09-20 DIAGNOSIS — C3412 Malignant neoplasm of upper lobe, left bronchus or lung: Secondary | ICD-10-CM

## 2021-09-20 DIAGNOSIS — C3490 Malignant neoplasm of unspecified part of unspecified bronchus or lung: Secondary | ICD-10-CM

## 2021-09-20 DIAGNOSIS — K5900 Constipation, unspecified: Secondary | ICD-10-CM | POA: Diagnosis not present

## 2021-09-20 DIAGNOSIS — K0889 Other specified disorders of teeth and supporting structures: Secondary | ICD-10-CM | POA: Diagnosis not present

## 2021-09-20 DIAGNOSIS — C7931 Secondary malignant neoplasm of brain: Secondary | ICD-10-CM | POA: Insufficient documentation

## 2021-09-20 DIAGNOSIS — K029 Dental caries, unspecified: Secondary | ICD-10-CM

## 2021-09-20 DIAGNOSIS — Z5111 Encounter for antineoplastic chemotherapy: Secondary | ICD-10-CM

## 2021-09-20 DIAGNOSIS — J321 Chronic frontal sinusitis: Secondary | ICD-10-CM | POA: Diagnosis not present

## 2021-09-20 DIAGNOSIS — G8929 Other chronic pain: Secondary | ICD-10-CM | POA: Diagnosis not present

## 2021-09-20 DIAGNOSIS — K089 Disorder of teeth and supporting structures, unspecified: Secondary | ICD-10-CM

## 2021-09-20 DIAGNOSIS — C779 Secondary and unspecified malignant neoplasm of lymph node, unspecified: Secondary | ICD-10-CM | POA: Diagnosis not present

## 2021-09-20 DIAGNOSIS — Z5112 Encounter for antineoplastic immunotherapy: Secondary | ICD-10-CM | POA: Diagnosis not present

## 2021-09-20 DIAGNOSIS — R058 Other specified cough: Secondary | ICD-10-CM | POA: Diagnosis not present

## 2021-09-20 DIAGNOSIS — R0982 Postnasal drip: Secondary | ICD-10-CM | POA: Diagnosis not present

## 2021-09-20 DIAGNOSIS — R0981 Nasal congestion: Secondary | ICD-10-CM | POA: Diagnosis not present

## 2021-09-20 LAB — CBC WITH DIFFERENTIAL (CANCER CENTER ONLY)
Abs Immature Granulocytes: 0.01 10*3/uL (ref 0.00–0.07)
Basophils Absolute: 0.1 10*3/uL (ref 0.0–0.1)
Basophils Relative: 1 %
Eosinophils Absolute: 0.3 10*3/uL (ref 0.0–0.5)
Eosinophils Relative: 7 %
HCT: 34.9 % — ABNORMAL LOW (ref 36.0–46.0)
Hemoglobin: 11.4 g/dL — ABNORMAL LOW (ref 12.0–15.0)
Immature Granulocytes: 0 %
Lymphocytes Relative: 27 %
Lymphs Abs: 1.1 10*3/uL (ref 0.7–4.0)
MCH: 33.9 pg (ref 26.0–34.0)
MCHC: 32.7 g/dL (ref 30.0–36.0)
MCV: 103.9 fL — ABNORMAL HIGH (ref 80.0–100.0)
Monocytes Absolute: 0.5 10*3/uL (ref 0.1–1.0)
Monocytes Relative: 13 %
Neutro Abs: 2.1 10*3/uL (ref 1.7–7.7)
Neutrophils Relative %: 52 %
Platelet Count: 278 10*3/uL (ref 150–400)
RBC: 3.36 MIL/uL — ABNORMAL LOW (ref 3.87–5.11)
RDW: 15.7 % — ABNORMAL HIGH (ref 11.5–15.5)
WBC Count: 4.1 10*3/uL (ref 4.0–10.5)
nRBC: 0 % (ref 0.0–0.2)

## 2021-09-20 LAB — CMP (CANCER CENTER ONLY)
ALT: 13 U/L (ref 0–44)
AST: 17 U/L (ref 15–41)
Albumin: 3.4 g/dL — ABNORMAL LOW (ref 3.5–5.0)
Alkaline Phosphatase: 79 U/L (ref 38–126)
Anion gap: 8 (ref 5–15)
BUN: 12 mg/dL (ref 8–23)
CO2: 27 mmol/L (ref 22–32)
Calcium: 9.2 mg/dL (ref 8.9–10.3)
Chloride: 106 mmol/L (ref 98–111)
Creatinine: 0.86 mg/dL (ref 0.44–1.00)
GFR, Estimated: >60 mL/min (ref >60)
Glucose, Bld: 130 mg/dL — ABNORMAL HIGH (ref 70–99)
Potassium: 3.8 mmol/L (ref 3.5–5.1)
Sodium: 141 mmol/L (ref 135–145)
Total Bilirubin: 0.2 mg/dL — ABNORMAL LOW (ref 0.3–1.2)
Total Protein: 6.6 g/dL (ref 6.5–8.1)

## 2021-09-20 MED ORDER — SODIUM CHLORIDE 0.9 % IV SOLN
500.0000 mg/m2 | Freq: Once | INTRAVENOUS | Status: AC
  Administered 2021-09-20: 900 mg via INTRAVENOUS
  Filled 2021-09-20: qty 20

## 2021-09-20 MED ORDER — AMOXICILLIN-POT CLAVULANATE 875-125 MG PO TABS: 1.0000 | ORAL_TABLET | Freq: Two times a day (BID) | ORAL | 0 refills | Status: DC

## 2021-09-20 MED ORDER — SODIUM CHLORIDE 0.9% FLUSH: 10.0000 mL | INTRAVENOUS | Status: DC | PRN

## 2021-09-20 MED ORDER — HEPARIN SOD (PORK) LOCK FLUSH 100 UNIT/ML IV SOLN: 500.0000 [IU] | Freq: Once | INTRAVENOUS | Status: DC | PRN

## 2021-09-20 MED ORDER — PALONOSETRON HCL INJECTION 0.25 MG/5ML
0.2500 mg | Freq: Once | INTRAVENOUS | Status: AC
  Administered 2021-09-20: 0.25 mg via INTRAVENOUS
  Filled 2021-09-20: qty 5

## 2021-09-20 MED ORDER — SODIUM CHLORIDE 0.9 % IV SOLN
150.0000 mg | Freq: Once | INTRAVENOUS | Status: AC
  Administered 2021-09-20: 150 mg via INTRAVENOUS
  Filled 2021-09-20: qty 150

## 2021-09-20 MED ORDER — SODIUM CHLORIDE 0.9 % IV SOLN
369.0000 mg | Freq: Once | INTRAVENOUS | Status: AC
  Administered 2021-09-20: 370 mg via INTRAVENOUS
  Filled 2021-09-20: qty 37

## 2021-09-20 MED ORDER — SODIUM CHLORIDE 0.9 % IV SOLN
10.0000 mg | Freq: Once | INTRAVENOUS | Status: AC
  Administered 2021-09-20: 10 mg via INTRAVENOUS
  Filled 2021-09-20: qty 10

## 2021-09-20 MED ORDER — SODIUM CHLORIDE 0.9 % IV SOLN
200.0000 mg | Freq: Once | INTRAVENOUS | Status: AC
  Administered 2021-09-20: 200 mg via INTRAVENOUS
  Filled 2021-09-20: qty 8

## 2021-09-20 MED ORDER — SODIUM CHLORIDE 0.9 % IV SOLN
Freq: Once | INTRAVENOUS | Status: AC
Start: 2021-09-20 — End: 2021-09-20

## 2021-09-20 NOTE — Progress Notes (Signed)
[facility] at [phone] and left a detailed message about referral and need for patient to be seen.

## 2021-09-20 NOTE — Telephone Encounter (Signed)
I have left a message requesting the patient return my call to discuss the results of her recent MRI brain and the treatment recommendations for the 2 new very small lesions in the brain. Tentatively, we have her scheduled for [facility] on Friday 09/23/21 with arrival at 12:30p for IV start in anticipation of a single fraction SRS treatment on 09/28/21 at 12:15pm with Dr. Zada Finders in Dr. Lacy Duverney absence.  Nicholos Johns, MMS, PA-C [facility] [place]: [phone]  Fax: [phone]

## 2021-09-20 NOTE — Patient Instructions (Signed)
[facility]  Discharge Instructions: Thank you for choosing [facility] to provide your oncology and hematology care.   If you have a lab appointment with the [facility], please go directly to the [facility] and check in at the registration area.   Wear comfortable clothing and clothing appropriate for easy access to any Portacath or PICC line.   We strive to give you quality time with your provider. You may need to reschedule your appointment if you arrive late (15 or more minutes).  Arriving late affects you and other patients whose appointments are after yours.  Also, if you miss three or more appointments without notifying the office, you may be dismissed from the clinic at the provider's discretion.      For prescription refill requests, have your pharmacy contact our office and allow 72 hours for refills to be completed.    Today you received the following chemotherapy and/or immunotherapy agents Pembrolizumab, Pemetrexed, and Carboplatin      To help prevent nausea and vomiting after your treatment, we encourage you to take your nausea medication as directed.  BELOW ARE SYMPTOMS THAT SHOULD BE REPORTED IMMEDIATELY: *FEVER GREATER THAN 100.4 F (38 C) OR HIGHER *CHILLS OR SWEATING *NAUSEA AND VOMITING THAT IS NOT CONTROLLED WITH YOUR NAUSEA MEDICATION *UNUSUAL SHORTNESS OF BREATH *UNUSUAL BRUISING OR BLEEDING *URINARY PROBLEMS (pain or burning when urinating, or frequent urination) *BOWEL PROBLEMS (unusual diarrhea, constipation, pain near the anus) TENDERNESS IN MOUTH AND THROAT WITH OR WITHOUT PRESENCE OF ULCERS (sore throat, sores in mouth, or a toothache) UNUSUAL RASH, SWELLING OR PAIN  UNUSUAL VAGINAL DISCHARGE OR ITCHING   Items with * indicate a potential emergency and should be followed up as soon as possible or go to the Emergency Department if any problems should occur.  Please show the CHEMOTHERAPY ALERT CARD or  IMMUNOTHERAPY ALERT CARD at check-in to the Emergency Department and triage nurse.  Should you have questions after your visit or need to cancel or reschedule your appointment, please contact [facility]  Dept: [phone]  and follow the prompts.  Office hours are 8:00 a.m. to 4:30 p.m. Monday - Friday. Please note that voicemails left after 4:00 p.m. may not be returned until the following business day.  We are closed weekends and major holidays. You have access to a nurse at all times for urgent questions. Please call the main number to the clinic Dept: [phone] and follow the prompts.   For any non-urgent questions, you may also contact your provider using MyChart. We now offer e-Visits for anyone 70 and older to request care online for non-urgent symptoms. For details visit [email or web address].   Also download the MyChart app! Go to the app store, search "MyChart", open the app, select [facility], and log in with your MyChart username and password.  Due to Covid, a mask is required upon entering the hospital/clinic. If you do not have a mask, one will be given to you upon arrival. For doctor visits, patients may have 1 support person aged 73 or older with them. For treatment visits, patients cannot have anyone with them due to current Covid guidelines and our immunocompromised population.

## 2021-09-21 ENCOUNTER — Ambulatory Visit: Payer: Self-pay | Admitting: Urology

## 2021-09-21 ENCOUNTER — Other Ambulatory Visit: Payer: Medicare HMO

## 2021-09-21 ENCOUNTER — Ambulatory Visit: Payer: Medicare HMO

## 2021-09-21 ENCOUNTER — Encounter: Payer: Self-pay | Admitting: Urology

## 2021-09-21 ENCOUNTER — Ambulatory Visit: Payer: Medicare HMO | Admitting: Internal Medicine

## 2021-09-21 NOTE — Progress Notes (Signed)
Patient states doing well. Recovering from a sinus infection but no issues reported at this time.  Meaningful use complete.  Patient aware of 2:30pm-09/20/21 telephone appointment taking place today-09/21/21. Per Freeman Caldron, PA-C  Patient contact [phone]

## 2021-09-21 NOTE — Progress Notes (Signed)
Radiation Oncology         [phone] ________________________________  Name: Jill [name] MRN: [id number]  Date: 09/20/2021  DOB: [birth year]  Post Treatment Note  CC: [name], MD  Curt Bears, MD  Diagnosis:   70 yo woman with cT2 cN2 cM1b, NSCLC, adenocarcinoma of the left upper lung - Stage IVA with a solitary brain metastasis at diagnosis and now with 2 new brain metastases.   Interval Since Last Radiation:  3 months   06/23/21: SRS// PTV1: The solitary 7 mm left frontal  brain metastasis was treated to 20 Gy in a single fraction 06/15/21 - 08/01/21: The primary tumor in the left lung and nodes were treated to 66 Gy in 33 fractions of 2 Gy (concurrent with chemotherapy).   Narrative:  I called the patient to conduct her routine scheduled 3 month follow up visit via telephone to spare the patient unnecessary potential exposure in the healthcare setting during the current COVID-19 pandemic.  The patient was notified in advance and gave permission to proceed with this visit format.    She tolerated the recent SRS and external radiation treatment relatively well.  She did experience some esophagitis, characterized as mild with mild to moderate fatigue.  She also continued with a persistent cough but denied hemoptysis, yellowish/green sputum, fever or chills.  She completed 6 cycles of carboplatin/paclitaxel systemic therapy on 07/25/2021.  On recent follow-up CT chest with Dr. Julien Nordmann on 08/22/2021 there was mild improvement in her disease and no evidence of disease progression.  She was transitioned to carboplatin, Alimta and Keytruda systemic therapy on 08/31/21 with plans to complete 4 cycles followed by maintenance therapy with Alimta and Keytruda starting from cycle 5 as long as there is no evidence of disease progression at that time.   She had her second cycle of the new systemic therapy on 09/20/21 and is tolerating this well. She had a follow up MRI brain on 09/15/21 which  unfortunately showed a new 3 mm left occipital metastasis and a new 1 mm left temporal metastasis but a stable appearance of the treated left frontal metastasis with increased mild edema.  We reviewed these results today.                 On review of systems the patient states that she is doing well in general and is currently without complaints aside from a mild, persistent cough that is unchanged recently.  She specifically denies chest pain, increased shortness of breath or hemoptysis.  She reports a healthy appetite and is maintaining her weight.  She denies dysphagia, abdominal pain, nausea, vomiting or night sweats.  She has occasional, mild headaches but nothing that has been persistent or concerning.  She denies any decrease in her visual or auditory acuity, dizziness/imbalance, focal weakness in the upper or lower extremities, tremor or seizure activity.  ALLERGIES:  is allergic to varenicline, ace inhibitors, prednisone, penicillins, and sulfa drugs cross reactors.  Meds: Current Outpatient Medications  Medication Sig Dispense Refill   amLODipine (NORVASC) 10 MG tablet Take 1 tablet by mouth once daily 90 tablet 1   amoxicillin-clavulanate (AUGMENTIN) 875-125 MG tablet Take 1 tablet by mouth 2 (two) times daily. 12 tablet 0   aspirin EC 81 MG tablet Take 1 tablet (81 mg total) by mouth daily. 90 tablet 2   atorvastatin (LIPITOR) 40 MG tablet Take 1 tablet by mouth once daily 90 tablet 1   Cholecalciferol (VITAMIN D3) 250 MCG (10000 UT) capsule  Take 10,000 mcg by mouth daily.     diphenhydramine-acetaminophen (TYLENOL PM) 25-500 MG TABS tablet Take 1 tablet by mouth every other day. At bedtime     escitalopram (LEXAPRO) 10 MG tablet Take 1 tablet by mouth once daily 90 tablet 1   folic acid (FOLVITE) 1 MG tablet Take 1 tablet (1 mg total) by mouth daily. 30 tablet 4   levothyroxine (SYNTHROID) 137 MCG tablet Take 1 tablet by mouth once daily 90 tablet 1   losartan (COZAAR) 100 MG tablet  Take 1 tablet by mouth once daily 90 tablet 1   metoprolol succinate (TOPROL-XL) 50 MG 24 hr tablet TAKE 1 TABLET BY MOUTH ONCE DAILY WITH MEALS OR  IMMEDIATELY  FOLLOWING 90 tablet 1   Multiple Vitamins-Minerals (MULTIVITAMIN WITH MINERALS) tablet Take 1 tablet by mouth daily.     nitroGLYCERIN (NITROSTAT) 0.4 MG SL tablet Place 1 tablet (0.4 mg total) under the tongue every 5 (five) minutes as needed. For chest pain 25 tablet 6   Omega-3 Fatty Acids (FISH OIL) 1000 MG CAPS Take 2 capsules (2,000 mg total) by mouth in the morning and at bedtime. 120 capsule 1   omeprazole (PRILOSEC) 20 MG capsule Take 1 capsule (20 mg total) by mouth daily. Prolonged use may damage the kidney 90 capsule 1   prochlorperazine (COMPAZINE) 10 MG tablet Take 1 tablet (10 mg total) by mouth every 6 (six) hours as needed for nausea or vomiting. 30 tablet 0   Vitamin A 2400 MCG (8000 UT) TABS Take 2,400 mg by mouth daily.     vitamin B-12 (CYANOCOBALAMIN) 1000 MCG tablet Take 1,000 mcg by mouth daily.     No current facility-administered medications for this encounter.    Physical Findings:  vitals were not taken for this visit.  Pain Assessment Pain Score: 0-No pain/10 Unable to assess due to telephone follow-up visit format.  Lab Findings: Lab Results  Component Value Date   WBC 4.1 09/20/2021   HGB 11.4 (L) 09/20/2021   HCT 34.9 (L) 09/20/2021   MCV 103.9 (H) 09/20/2021   PLT 278 09/20/2021     Radiographic Findings: CT Chest W Contrast  Result Date: 08/24/2021 CLINICAL DATA:  70 year old female with history of non-small cell lung cancer diagnosed in July 2022 status post chemotherapy and radiation therapy (completed in September). Follow-up study. EXAM: CT CHEST WITH CONTRAST TECHNIQUE: Multidetector CT imaging of the chest was performed during intravenous contrast administration. CONTRAST:  84mL OMNIPAQUE IOHEXOL 350 MG/ML SOLN COMPARISON:  Chest CT 05/18/2021. FINDINGS: Cardiovascular: Heart size is  normal. There is no significant pericardial fluid, thickening or pericardial calcification. There is aortic atherosclerosis, as well as atherosclerosis of the great vessels of the mediastinum and the coronary arteries, including calcified atherosclerotic plaque in the left main, left anterior descending, left circumflex and right coronary arteries. Status post median sternotomy for CABG including LIMA to the LAD. Mediastinum/Nodes: Several prominent borderline enlarged mediastinal and left hilar lymph nodes are noted, measuring up to 1 cm in short axis in the left hilar region (axial image 66 of series 2) and 1.1 cm in short axis in the low right paratracheal nodal station. Esophagus is unremarkable in appearance. No axillary lymphadenopathy. Lungs/Pleura: Partially cavitary macrolobulated mass with spiculated margins in the left upper lobe (axial image 61 of series 5 and coronal image 69 of series 6) measuring 3.1 x 2.6 x 2.1 cm appears smaller than the prior examination. Spiculations from this lesion extend laterally to the overlying pleura which  is retracted toward the lesion, and extend posteriorly to come in contact with the left major fissure. 7 x 6 mm left upper lobe pulmonary nodule (axial image 52 of series 5), similar in retrospect to the prior study. 5 mm subpleural nodule in the anterior aspect of the left lower lobe abutting the major fissure (axial image 79 of series 5), stable. No other definite suspicious appearing pulmonary nodules or masses are noted. No acute consolidative airspace disease. No pleural effusions. Diffuse bronchial wall thickening with moderate centrilobular and paraseptal emphysema. Upper Abdomen: Aortic atherosclerosis. Low-attenuation lesions associated with the left kidney, largest of which is 2.7 cm in the anterior aspect of the interpolar region, compatible with simple cysts. Aortic atherosclerosis. Subcentimeter low-attenuation lesion in segment 7 of the liver, too small to  characterize, but similar to the prior study and statistically likely to represent a tiny cyst. Musculoskeletal: Median sternotomy wires. There are no aggressive appearing lytic or blastic lesions noted in the visualized portions of the skeleton. IMPRESSION: 1. Today's study demonstrates slight decreased size of large partially cavitary macrolobulated and spiculated left upper lobe mass with borderline enlarged left hilar and low right paratracheal lymph nodes which were previously hypermetabolic on PET-CT 29/52/8413. Findings are compatible with at least T2a, N3, Mx disease (i.e., stage IIIB). 2. Small pulmonary nodules in the left upper lobe and left lower lobe are stable compared to the prior examination, as above. Continued attention on follow-up studies is recommended. 3. Aortic atherosclerosis, in addition to left main and 3 vessel coronary artery disease. Status post median sternotomy for CABG including LIMA to the LAD. 4. Diffuse bronchial wall thickening with moderate centrilobular and paraseptal emphysema; imaging findings suggestive of underlying COPD. Aortic Atherosclerosis (ICD10-I70.0) and Emphysema (ICD10-J43.9). Electronically Signed   By: Vinnie Langton M.D.   On: 08/24/2021 06:27   MR Brain W Wo Contrast  Result Date: 09/16/2021 CLINICAL DATA:  Brain/CNS neoplasm, surveillance. Follow-up on treated metastatic lung cancer. EXAM: MRI HEAD WITHOUT AND WITH CONTRAST TECHNIQUE: Multiplanar, multiecho pulse sequences of the brain and surrounding structures were obtained without and with intravenous contrast. CONTRAST:  86mL MULTIHANCE GADOBENATE DIMEGLUMINE 529 MG/ML IV SOLN COMPARISON:  MR head 06/09/2021. FINDINGS: Brain: New 3 mm left occipital lesion with minimal edema (series 11, image 61). New punctate 1 mm lesion along the left temporal operculum (series 11, image 82). Stable 6 mm lesion of the left superior frontal gyrus. Increase in mild surrounding edema. Prior right frontal ventricular  catheter tract is again identified with chronic blood products and gliosis. Stable additional patchy foci of T2 hyperintensity in the supratentorial white matter likely reflecting chronic microvascular ischemic changes. Unchanged mild dural enhancement. There is no acute infarction.  No hydrocephalus. Vascular: There is artifact at the level of treated basilar tip aneurysm. Major vessel flow voids at the skull base are preserved. Skull and upper cervical spine: Normal marrow signal is preserved. Sinuses/Orbits: Mild mucosal thickening.  Right lens replacement. Other: Sella is unremarkable.  Mastoid air cells are clear. IMPRESSION: New 3 mm left occipital and 1 mm left temporal metastases. Stable left frontal metastasis with increased mild edema. Electronically Signed   By: Macy Mis M.D.   On: 09/16/2021 10:00    Impression/Plan: 1. 70 yo woman with cT2 cN2 cM1b, NSCLC, adenocarcinoma of the left upper lung - Stage IVA with a solitary brain metastasis at diagnosis and now with 2 new brain metastases.  We reviewed the findings on her recent MRI brain and the treatment recommendations  for the 2 new very small lesions in the brain. Tentatively, we have her scheduled for [facility] on Friday 09/23/21 with arrival at 12:30p for IV start in anticipation of a single fraction SRS treatment on 09/28/21 at 12:15pm with Dr. Zada Finders in Dr. Lacy Duverney absence. She reports that there may be some transportation issues with the Friday [facility] appointment so we will reach our to see if we can help with the [place] program. She is comfortable and in agreement with the stated plan and knows that she is welcome to call at any time with any questions or concerns related to the radiation.    Nicholos Johns, PA-C

## 2021-09-23 ENCOUNTER — Ambulatory Visit
Admission: RE | Admit: 2021-09-23 | Discharge: 2021-09-23 | Disposition: A | Discharge status: Home / Self Care | Payer: Medicare HMO | Source: Ambulatory Visit | Attending: Radiation Oncology | Admitting: Radiation Oncology

## 2021-09-23 ENCOUNTER — Other Ambulatory Visit: Payer: Self-pay

## 2021-09-23 DIAGNOSIS — C3490 Malignant neoplasm of unspecified part of unspecified bronchus or lung: Secondary | ICD-10-CM

## 2021-09-23 DIAGNOSIS — C7931 Secondary malignant neoplasm of brain: Secondary | ICD-10-CM

## 2021-09-23 DIAGNOSIS — C3412 Malignant neoplasm of upper lobe, left bronchus or lung: Secondary | ICD-10-CM | POA: Diagnosis not present

## 2021-09-23 MED ORDER — SODIUM CHLORIDE 0.9% FLUSH
10.0000 mL | Freq: Once | INTRAVENOUS | Status: AC
  Administered 2021-09-23: 10 mL via INTRAVENOUS

## 2021-09-23 NOTE — Addendum Note (Signed)
Encounter addended by: Tyler Pita, MD on: 09/23/2021 2:39 PM  Actions taken: Medication List reviewed, Problem List reviewed, Allergies reviewed, Pend clinical note

## 2021-09-23 NOTE — Progress Notes (Signed)
Has armband been applied?  Yes.    Does patient have an allergy to IV contrast dye?: No.   Has patient ever received premedication for IV contrast dye?: No.   Does patient take metformin?: No.  If patient does take metformin when was the last dose:  N/A  Date of lab work: September 20, 2021 BUN: 12 CR: 0.86  IV site: antecubital right, condition patent and no redness  Has IV site been added to flowsheet?  Yes.    There were no vitals taken for this visit.

## 2021-09-23 NOTE — Progress Notes (Signed)
  Radiation Oncology         [phone] ________________________________  Name: Jill [name] MRN: [id number]  Date: 09/23/2021  DOB: 03/15/1951  SIMULATION AND TREATMENT PLANNING NOTE    ICD-10-CM   1. Primary malignant neoplasm of lung with metastasis to brain ([place])  C34.90    C79.31       DIAGNOSIS:  70 yo woman with cT2 cN2 cM1b adenocarcinoma of the left upper lung - Stage IVA with a new 3 mm left occipital and 1 mm left temporal metastases.  NARRATIVE:  The patient was brought to the [facility].  Identity was confirmed.  All relevant records and images related to the planned course of therapy were reviewed.  The patient freely provided informed written consent to proceed with treatment after reviewing the details related to the planned course of therapy. The consent form was witnessed and verified by the simulation staff. Intravenous access was established for contrast administration. Then, the patient was set-up in a stable reproducible supine position for radiation therapy.  A relocatable thermoplastic stereotactic head frame was fabricated for precise immobilization.  CT images were obtained.  Surface markings were placed.  The CT images were loaded into the planning software and fused with the patient's targeting MRI scan.  Then the target and avoidance structures were contoured.  Treatment planning then occurred.  The radiation prescription was entered and confirmed.  I have requested 3D planning  I have requested a DVH of the following structures: Brain stem, brain, left eye, right eye, lenses, optic chiasm, target volumes, uninvolved brain, and normal tissue.    SPECIAL TREATMENT PROCEDURE:  The planned course of therapy using radiation constitutes a special treatment procedure. Special care is required in the management of this patient for the following reasons. This treatment constitutes a Special Treatment Procedure for the following reason: High dose per  fraction requiring special monitoring for increased toxicities of treatment including daily imaging.  The special nature of the planned course of radiotherapy will require increased physician supervision and oversight to ensure patient's safety with optimal treatment outcomes.  This requires extended time and effort.  PLAN:  The patient will receive 20 Gy in 1 fraction.  ________________________________  Sheral Apley Tammi Klippel, M.D.

## 2021-09-24 NOTE — Addendum Note (Signed)
Encounter addended by: Tyler Pita, MD on: 09/24/2021 11:45 AM  Actions taken: Medication List reviewed, Problem List reviewed, Allergies reviewed, Clinical Note Signed

## 2021-09-27 ENCOUNTER — Other Ambulatory Visit: Payer: Medicare HMO

## 2021-09-27 ENCOUNTER — Inpatient Hospital Stay: Payer: Medicare HMO

## 2021-09-27 ENCOUNTER — Other Ambulatory Visit (HOSPITAL_COMMUNITY): Payer: Medicare HMO | Admitting: Dentistry

## 2021-09-28 ENCOUNTER — Ambulatory Visit
Admission: RE | Admit: 2021-09-28 | Discharge: 2021-09-28 | Disposition: A | Discharge status: Home / Self Care | Payer: Medicare HMO | Source: Ambulatory Visit | Attending: Radiation Oncology | Admitting: Radiation Oncology

## 2021-09-28 ENCOUNTER — Other Ambulatory Visit: Payer: Medicare HMO

## 2021-09-28 ENCOUNTER — Inpatient Hospital Stay: Payer: Medicare HMO

## 2021-09-28 ENCOUNTER — Other Ambulatory Visit: Payer: Self-pay

## 2021-09-28 ENCOUNTER — Encounter: Payer: Self-pay | Admitting: Radiation Oncology

## 2021-09-28 VITALS — BP 131/68 | HR 65 | Temp 97.0°F | Resp 18

## 2021-09-28 DIAGNOSIS — C3412 Malignant neoplasm of upper lobe, left bronchus or lung: Secondary | ICD-10-CM | POA: Diagnosis not present

## 2021-09-28 DIAGNOSIS — C3490 Malignant neoplasm of unspecified part of unspecified bronchus or lung: Secondary | ICD-10-CM

## 2021-09-28 DIAGNOSIS — R0981 Nasal congestion: Secondary | ICD-10-CM | POA: Diagnosis not present

## 2021-09-28 DIAGNOSIS — Z5112 Encounter for antineoplastic immunotherapy: Secondary | ICD-10-CM | POA: Diagnosis not present

## 2021-09-28 DIAGNOSIS — K5900 Constipation, unspecified: Secondary | ICD-10-CM | POA: Diagnosis not present

## 2021-09-28 DIAGNOSIS — C779 Secondary and unspecified malignant neoplasm of lymph node, unspecified: Secondary | ICD-10-CM | POA: Diagnosis not present

## 2021-09-28 DIAGNOSIS — R0982 Postnasal drip: Secondary | ICD-10-CM | POA: Diagnosis not present

## 2021-09-28 DIAGNOSIS — C7931 Secondary malignant neoplasm of brain: Secondary | ICD-10-CM | POA: Diagnosis not present

## 2021-09-28 DIAGNOSIS — R058 Other specified cough: Secondary | ICD-10-CM | POA: Diagnosis not present

## 2021-09-28 DIAGNOSIS — Z5111 Encounter for antineoplastic chemotherapy: Secondary | ICD-10-CM | POA: Diagnosis not present

## 2021-09-28 DIAGNOSIS — K0889 Other specified disorders of teeth and supporting structures: Secondary | ICD-10-CM | POA: Diagnosis not present

## 2021-09-28 LAB — CMP (CANCER CENTER ONLY)
ALT: 18 U/L (ref 0–44)
AST: 20 U/L (ref 15–41)
Albumin: 3.5 g/dL (ref 3.5–5.0)
Alkaline Phosphatase: 71 U/L (ref 38–126)
Anion gap: 7 (ref 5–15)
BUN: 18 mg/dL (ref 8–23)
CO2: 27 mmol/L (ref 22–32)
Calcium: 8.9 mg/dL (ref 8.9–10.3)
Chloride: 105 mmol/L (ref 98–111)
Creatinine: 0.78 mg/dL (ref 0.44–1.00)
GFR, Estimated: >60 mL/min (ref >60)
Glucose, Bld: 84 mg/dL (ref 70–99)
Potassium: 3.8 mmol/L (ref 3.5–5.1)
Sodium: 139 mmol/L (ref 135–145)
Total Bilirubin: 0.3 mg/dL (ref 0.3–1.2)
Total Protein: 6.6 g/dL (ref 6.5–8.1)

## 2021-09-28 LAB — CBC WITH DIFFERENTIAL (CANCER CENTER ONLY)
Abs Immature Granulocytes: 0.01 10*3/uL (ref 0.00–0.07)
Basophils Absolute: 0 10*3/uL (ref 0.0–0.1)
Basophils Relative: 0 %
Eosinophils Absolute: 0.3 10*3/uL (ref 0.0–0.5)
Eosinophils Relative: 14 %
HCT: 29.5 % — ABNORMAL LOW (ref 36.0–46.0)
Hemoglobin: 10 g/dL — ABNORMAL LOW (ref 12.0–15.0)
Immature Granulocytes: 0 %
Lymphocytes Relative: 32 %
Lymphs Abs: 0.8 10*3/uL (ref 0.7–4.0)
MCH: 35.1 pg — ABNORMAL HIGH (ref 26.0–34.0)
MCHC: 33.9 g/dL (ref 30.0–36.0)
MCV: 103.5 fL — ABNORMAL HIGH (ref 80.0–100.0)
Monocytes Absolute: 0.3 10*3/uL (ref 0.1–1.0)
Monocytes Relative: 10 %
Neutro Abs: 1.1 10*3/uL — ABNORMAL LOW (ref 1.7–7.7)
Neutrophils Relative %: 44 %
Platelet Count: 108 10*3/uL — ABNORMAL LOW (ref 150–400)
RBC: 2.85 MIL/uL — ABNORMAL LOW (ref 3.87–5.11)
RDW: 14.2 % (ref 11.5–15.5)
WBC Count: 2.4 10*3/uL — ABNORMAL LOW (ref 4.0–10.5)
nRBC: 0 % (ref 0.0–0.2)

## 2021-09-28 LAB — TSH: TSH: 2.386 u[IU]/mL (ref 0.308–3.960)

## 2021-09-28 NOTE — Op Note (Signed)
  Name: Jill [name]  MRN: [id number]  Date: 09/28/2021   DOB: [birth year]  Stereotactic Radiosurgery Operative Note  PRE-OPERATIVE DIAGNOSIS:  Brain Metastases  POST-OPERATIVE DIAGNOSIS:  Brain Metastases  PROCEDURE:  Stereotactic Radiosurgery  SURGEON:  Judith Part, MD  NARRATIVE: The patient underwent a radiation treatment planning session in the radiation oncology simulation suite under the care of the radiation oncology physician and physicist.  I participated closely in the radiation treatment planning afterwards. The patient underwent planning CT which was fused to 3T high resolution MRI with 1 mm axial slices.  These images were fused on the planning system.  We contoured the gross target volumes and subsequently expanded this to yield the Planning Target Volume. I actively participated in the planning process.  I helped to define and review the target contours and also the contours of the optic pathway, eyes, brainstem and selected nearby organs at risk.  All the dose constraints for critical structures were reviewed and compared to AAPM Task Group 101.  The prescription dose conformity was reviewed.  I approved the plan electronically.    Accordingly, Jill [name] was brought to the TrueBeam stereotactic radiation treatment linac and placed in the custom immobilization mask.  The patient was aligned according to the IR fiducial markers with BrainLab Exactrac, then orthogonal x-rays were used in ExacTrac with the 6DOF robotic table and the shifts were made to align the patient  Jill [name] received stereotactic radiosurgery uneventfully.    Lesions treated:  2   Complex lesions treated:  0 (>3.5 cm, <71mm of optic path, or within the brainstem)   The detailed description of the procedure is recorded in the radiation oncology procedure note.  I was present for the duration of the procedure.  DISPOSITION:  Following delivery, the patient was transported to nursing in stable  condition and monitored for possible acute effects to be discharged to home in stable condition with follow-up in one month.  Judith Part, MD 09/28/2021 5:10 PM

## 2021-09-28 NOTE — Progress Notes (Signed)
Jill [name]  [facility] brain treatment to nursing for observation, denies any pain or symptoms.  Had no complaints or concerns at this time.  Ready to go to lunch.  Nothing else follows.

## 2021-10-01 NOTE — Progress Notes (Signed)
  Radiation Oncology         [phone] ________________________________  Stereotactic Treatment Procedure Note  Name: Jill [name] MRN: [id number]  Date: 09/28/2021  DOB: 1951-04-05  SPECIAL TREATMENT PROCEDURE    ICD-10-CM   1. Primary malignant neoplasm of lung with metastasis to brain ([place])  C34.90    C79.31       3D TREATMENT PLANNING AND DOSIMETRY:  The patient's radiation plan was reviewed and approved by neurosurgery and radiation oncology prior to treatment.  It showed 3-dimensional radiation distributions overlaid onto the planning CT/MRI image set.  The [facility] for the target structures as well as the organs at risk were reviewed. The documentation of the 3D plan and dosimetry are filed in the radiation oncology EMR.  NARRATIVE:  Jill [name] was brought to the TrueBeam stereotactic radiation treatment machine and placed supine on the CT couch. The head frame was applied, and the patient was set up for stereotactic radiosurgery.  Neurosurgery was present for the set-up and delivery  SIMULATION VERIFICATION:  In the couch zero-angle position, the patient underwent Exactrac imaging using the Brainlab system with orthogonal KV images.  These were carefully aligned and repeated to confirm treatment position for each of the isocenters.  The Exactrac snap film verification was repeated at each couch angle.  PROCEDURE: Jill [name] received stereotactic radiosurgery to the following targets: Two brain metastases in the left occipital 3 mm and left temporal 1 mm were treated using 7 Dynamic Conformal Arcs to a prescription dose of 20 Gy.  ExacTrac registration was performed for each couch angle.  The 100% isodose line was prescribed.  6 MV X-rays were delivered in the flattening filter free beam mode.  STEREOTACTIC TREATMENT MANAGEMENT:  Following delivery, the patient was transported to nursing in stable condition and monitored for possible acute effects.  Vital signs were recorded  BP 131/68 (BP Location: Right Arm, Patient Position: Standing, Cuff Size: Normal)   Pulse 65   Temp (!) 97 F (36.1 C) (Oral)   Resp 18   SpO2 99% . The patient tolerated treatment without significant acute effects, and was discharged to home in stable condition.    PLAN: Follow-up in one month.  ________________________________  Sheral Apley. Tammi Klippel, M.D.

## 2021-10-04 ENCOUNTER — Inpatient Hospital Stay: Payer: Medicare HMO

## 2021-10-05 ENCOUNTER — Inpatient Hospital Stay: Payer: Medicare HMO

## 2021-10-05 ENCOUNTER — Ambulatory Visit (INDEPENDENT_AMBULATORY_CARE_PROVIDER_SITE_OTHER): Payer: Medicare HMO | Admitting: Dentistry

## 2021-10-05 ENCOUNTER — Encounter (HOSPITAL_COMMUNITY): Payer: Self-pay | Admitting: Dentistry

## 2021-10-05 ENCOUNTER — Encounter: Payer: Self-pay | Admitting: Internal Medicine

## 2021-10-05 ENCOUNTER — Other Ambulatory Visit: Payer: Self-pay

## 2021-10-05 ENCOUNTER — Other Ambulatory Visit: Payer: Medicare HMO

## 2021-10-05 VITALS — BP 142/61 | HR 63 | Temp 98.3°F

## 2021-10-05 DIAGNOSIS — Z5111 Encounter for antineoplastic chemotherapy: Secondary | ICD-10-CM | POA: Diagnosis not present

## 2021-10-05 DIAGNOSIS — K083 Retained dental root: Secondary | ICD-10-CM

## 2021-10-05 DIAGNOSIS — K5900 Constipation, unspecified: Secondary | ICD-10-CM | POA: Diagnosis not present

## 2021-10-05 DIAGNOSIS — Z0120 Encounter for dental examination and cleaning without abnormal findings: Secondary | ICD-10-CM

## 2021-10-05 DIAGNOSIS — Z5112 Encounter for antineoplastic immunotherapy: Secondary | ICD-10-CM | POA: Diagnosis not present

## 2021-10-05 DIAGNOSIS — K045 Chronic apical periodontitis: Secondary | ICD-10-CM

## 2021-10-05 DIAGNOSIS — C3412 Malignant neoplasm of upper lobe, left bronchus or lung: Secondary | ICD-10-CM

## 2021-10-05 DIAGNOSIS — C7931 Secondary malignant neoplasm of brain: Secondary | ICD-10-CM | POA: Diagnosis not present

## 2021-10-05 DIAGNOSIS — R0981 Nasal congestion: Secondary | ICD-10-CM | POA: Diagnosis not present

## 2021-10-05 DIAGNOSIS — K0850 Unsatisfactory restoration of tooth, unspecified: Secondary | ICD-10-CM

## 2021-10-05 DIAGNOSIS — K029 Dental caries, unspecified: Secondary | ICD-10-CM

## 2021-10-05 DIAGNOSIS — R058 Other specified cough: Secondary | ICD-10-CM | POA: Diagnosis not present

## 2021-10-05 DIAGNOSIS — R0982 Postnasal drip: Secondary | ICD-10-CM | POA: Diagnosis not present

## 2021-10-05 DIAGNOSIS — C3490 Malignant neoplasm of unspecified part of unspecified bronchus or lung: Secondary | ICD-10-CM

## 2021-10-05 DIAGNOSIS — K0530 Chronic periodontitis, unspecified: Secondary | ICD-10-CM

## 2021-10-05 DIAGNOSIS — K08109 Complete loss of teeth, unspecified cause, unspecified class: Secondary | ICD-10-CM

## 2021-10-05 DIAGNOSIS — K036 Deposits [accretions] on teeth: Secondary | ICD-10-CM

## 2021-10-05 DIAGNOSIS — K0889 Other specified disorders of teeth and supporting structures: Secondary | ICD-10-CM | POA: Diagnosis not present

## 2021-10-05 DIAGNOSIS — C779 Secondary and unspecified malignant neoplasm of lymph node, unspecified: Secondary | ICD-10-CM | POA: Diagnosis not present

## 2021-10-05 LAB — CMP (CANCER CENTER ONLY)
ALT: 22 U/L (ref 0–44)
AST: 24 U/L (ref 15–41)
Albumin: 3.9 g/dL (ref 3.5–5.0)
Alkaline Phosphatase: 78 U/L (ref 38–126)
Anion gap: 9 (ref 5–15)
BUN: 14 mg/dL (ref 8–23)
CO2: 26 mmol/L (ref 22–32)
Calcium: 9.2 mg/dL (ref 8.9–10.3)
Chloride: 106 mmol/L (ref 98–111)
Creatinine: 0.84 mg/dL (ref 0.44–1.00)
GFR, Estimated: >60 mL/min (ref >60)
Glucose, Bld: 99 mg/dL (ref 70–99)
Potassium: 4.4 mmol/L (ref 3.5–5.1)
Sodium: 141 mmol/L (ref 135–145)
Total Bilirubin: 0.3 mg/dL (ref 0.3–1.2)
Total Protein: 7.2 g/dL (ref 6.5–8.1)

## 2021-10-05 LAB — CBC WITH DIFFERENTIAL (CANCER CENTER ONLY)
Abs Immature Granulocytes: 0 10*3/uL (ref 0.00–0.07)
Basophils Absolute: 0 10*3/uL (ref 0.0–0.1)
Basophils Relative: 1 %
Eosinophils Absolute: 0.1 10*3/uL (ref 0.0–0.5)
Eosinophils Relative: 5 %
HCT: 32.4 % — ABNORMAL LOW (ref 36.0–46.0)
Hemoglobin: 10.7 g/dL — ABNORMAL LOW (ref 12.0–15.0)
Immature Granulocytes: 0 %
Lymphocytes Relative: 48 %
Lymphs Abs: 1 10*3/uL (ref 0.7–4.0)
MCH: 34.9 pg — ABNORMAL HIGH (ref 26.0–34.0)
MCHC: 33 g/dL (ref 30.0–36.0)
MCV: 105.5 fL — ABNORMAL HIGH (ref 80.0–100.0)
Monocytes Absolute: 0.4 10*3/uL (ref 0.1–1.0)
Monocytes Relative: 17 %
Neutro Abs: 0.6 10*3/uL — ABNORMAL LOW (ref 1.7–7.7)
Neutrophils Relative %: 29 %
Platelet Count: 181 10*3/uL (ref 150–400)
RBC: 3.07 MIL/uL — ABNORMAL LOW (ref 3.87–5.11)
RDW: 14.9 % (ref 11.5–15.5)
WBC Count: 2.1 10*3/uL — ABNORMAL LOW (ref 4.0–10.5)
nRBC: 0 % (ref 0.0–0.2)

## 2021-10-05 NOTE — Progress Notes (Signed)
Department of Dental Medicine               LIMITED EXAM   Service Date:   10/05/2021  Patient Name:   Jill [name] Date of Birth:   01-24-1951 Medical Record Number: [id number]  Referring Provider:           Curt Bears, M.D.   PLAN/RECOMMENDATIONS   Assessment Multiple carious teeth in all 4 quadrants, retained root tips and teeth with periapical infection. Upper right quadrant with several grossly decayed & infected teeth.  Recommendations Refer to oral surgeon for extractions of upper right quadrant teeth numbers 3, 4, 5 & 7.   Establish dental care at an outside office of the patient's choice for routine care including cleanings/periodontal therapy, periodic exams and extractions of other indicated/non-restorable teeth.  Plan Discuss case with medical team and coordinate treatment as needed.   The patient elected to be referred to an oral surgeon for extractions. Referral placed to [facility] for indicated treatment. Rx:  Amoxicillin 500 mg to have to take as needed if abscess reoccurs or worsens prior to having upper right teeth extracted. Call should any questions or concerns arise.  Discussed in detail all treatment options and recommendations with the patient and they are agreeable to the plan.    Thank you for consulting with [facility] and for the opportunity to participate in this patient's treatment.  Should you have any questions or concerns, please contact the [facility] at [phone].   10/05/2021 Consult Note:  COVID-19 SCREENING:  The patient denies symptoms concerning for COVID-19 infection including fever, chills, cough, or newly developed shortness of breath.   HISTORY OF PRESENT ILLNESS: Jill [name] is a very pleasant 70 y.o. 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   DENTAL HISTORY: Per referral to our clinic, the patient is concerned for a recurrent dental abscess that she went to the E.D. for about 2 months ago.  Note from E.D. visit on 07/14/21:   Patient is a 70 year old female with a history of primary malignant neoplasm of the lung with metastasis to the brain currently on chemotherapy as well as radiation, COPD, hyperlipidemia, hypertension, tobacco abuse, who presents to the emergency department due to dental pain and swelling.  States her symptoms started yesterday.  States they are along the right upper portion of her mouth.  Denies any sore throat, difficulty swallowing, fevers, chills, nausea, vomiting, shortness of breath.  Reports a history of similar symptoms in the past.   Patient has tenderness along the gingiva in the right upper portion of the mouth surrounding the first molar.  No palpable fluctuance or region that appears amenable to I&D.  Uvula midline and patient is readily handling secretions.  Soft compartments.  Doubt Ludwig's angina or PTA at this time.  Denies any systemic symptoms such as fevers, chills, nausea, or vomiting.  Afebrile and not tachycardic.   Feel that the patient is stable for discharge at this time and she is agreeable.  She has an allergy to both penicillins as well as sulfa drugs so will discharge on clindamycin.  Recommended follow-up with her oncologist as needed.  Since this time, she is concerned that the teeth are abscessing again.  She has a bad taste in her mouth.  Other than emergency appointments, her last dental visit was in November of 2019 when she had a tooth extracted at [facility]  She recently finished a course of Augmentin which was prescribed to her by her medical oncologist due to a recent flare-up of upper right quadrant  tooth/teeth. Patient is able to manage oral secretions.  Patient denies dysphagia, odynophagia, dysphonia, SOB and neck pain.  Patient denies fever, rigors and malaise.   CHIEF COMPLAINT:  "Tooth abscess," points to the upper right quadrant.   Patient Active Problem List   Diagnosis Date Noted   Encounter for antineoplastic immunotherapy 08/24/2021   Primary malignant neoplasm of lung with metastasis to brain [facility]) 06/07/2021   Encounter for antineoplastic chemotherapy 06/06/2021   Primary adenocarcinoma of left upper lobe of lung ([place]) 05/16/2021   History of aneurysm 04/12/2021   Lip lesion 08/06/2020   No-show for appointment 07/16/2020   Screening mammography declined 07/07/2020   CKD (chronic kidney disease) 07/07/2020   Depression 07/06/2020   Urine incontinence 07/06/2020   Dental caries 08/21/2018   H/O measles 03/19/2018   GERD (gastroesophageal reflux disease) 06/01/2017   Prediabetes 02/06/2017   COPD (chronic obstructive pulmonary disease) ([place]) 09/01/2016   Tobacco abuse 06/05/2012   Coronary artery disease    Hyperlipidemia    Hypertension    Hypothyroidism    Past Medical History:  Diagnosis Date   Anxiety    ON PAXIL, XANAX   AVM (arteriovenous malformation) brain    s/p stent/coil   Blood transfusion    x 2   Cancer (HCC)    Left lung   COPD (chronic obstructive pulmonary disease) (HCC)    no inhaler, no oxygen   Coronary artery disease    Prior inferior MI with stent to RCA, s/p CABG in 2008   Depression    Dizziness    Dyspnea    with exertion, no oxygen   Fatigue    Foot injury 06/01/2017   right - RESOLVED, no longer an issue per patient 05/18/21   GERD (gastroesophageal reflux disease)    Headache(784.0)    UNRUPTURED CEREBRAL ANEURYSM   Hyperlipidemia    Hypertension    Hypothyroidism    Infected cyst of skin 09/18/2013   Left knee pain 06/05/2012   Lung mass    Left lung   Myocardial infarction ([place])    Normal nuclear stress test  Ju;y 2012   No ischemia. EF 70%; fixed defect involving septum, inferoseptal and inferior wall   Pain, dental 02/06/2017   Poor dental hygiene    Retroperitoneal bleeding    Following cardiac cath   Tobacco abuse    Urine discoloration 09/18/2013   UTI (lower urinary tract infection) 10/16/2013   Past Surgical History:  Procedure Laterality Date   CARDIAC CATHETERIZATION  01/02/2007   IT REVEALS MILD INFERIOR WALL HYPOKINESIS. THE EJECTION FRACTION IS AROUND 50%   COLONOSCOPY     CORONARY ARTERY BYPASS GRAFT  11/06/2006   LIMA to LAD, SVG to DX, SVG to LCX & SVG to OM 1 & 2, and SVG to PD   CORONARY STENT PLACEMENT     Remote past stent to RCA   EYE SURGERY Right    cataracts removed   HEMORRHOID SURGERY  11/07/1987   UPPER GI ENDOSCOPY     VENTRICULOSTOMY  10/06/2011   Procedure: VENTRICULOSTOMY;  Surgeon: Winfield Cunas;  Location: [place] [facility];  Service: Neurosurgery;  Laterality: Right;  Insertion of Ventriculostomy Catheter   VIDEO BRONCHOSCOPY WITH ENDOBRONCHIAL NAVIGATION Left 05/20/2021   Procedure: VIDEO BRONCHOSCOPY WITH ENDOBRONCHIAL NAVIGATION;  Surgeon: Garner Nash, DO;  Location: [place];  Service: Pulmonary;  Laterality: Left;   VIDEO BRONCHOSCOPY WITH ENDOBRONCHIAL ULTRASOUND Bilateral 05/20/2021   Procedure: VIDEO BRONCHOSCOPY WITH ENDOBRONCHIAL ULTRASOUND;  Surgeon: Garner Nash, DO;  Location: [place];  Service: Pulmonary;  Laterality: Bilateral;   WISDOM TOOTH EXTRACTION     Allergies  Allergen Reactions   Varenicline Other (See Comments)    Reports change in mood with increased irritability and homicidal ideation.  2007    Ace Inhibitors Cough   Prednisone Other (See Comments)    Crying, disorientation - occurred in 1994 Rage   Penicillins Rash    Reaction: Childhood   Sulfa Drugs Cross Reactors Rash   Current Outpatient Medications  Medication Sig Dispense Refill   amLODipine (NORVASC) 10 MG tablet Take 1 tablet by mouth once daily 90 tablet 1    amoxicillin-clavulanate (AUGMENTIN) 875-125 MG tablet Take 1 tablet by mouth 2 (two) times daily. 12 tablet 0   aspirin EC 81 MG tablet Take 1 tablet (81 mg total) by mouth daily. 90 tablet 2   atorvastatin (LIPITOR) 40 MG tablet Take 1 tablet by mouth once daily 90 tablet 1   Cholecalciferol (VITAMIN D3) 250 MCG (10000 UT) capsule Take 10,000 mcg by mouth daily.     diphenhydramine-acetaminophen (TYLENOL PM) 25-500 MG TABS tablet Take 1 tablet by mouth every other day. At bedtime     escitalopram (LEXAPRO) 10 MG tablet Take 1 tablet by mouth once daily 90 tablet 1   folic acid (FOLVITE) 1 MG tablet Take 1 tablet (1 mg total) by mouth daily. 30 tablet 4   levothyroxine (SYNTHROID) 137 MCG tablet Take 1 tablet by mouth once daily 90 tablet 1   losartan (COZAAR) 100 MG tablet Take 1 tablet by mouth once daily 90 tablet 1   metoprolol succinate (TOPROL-XL) 50 MG 24 hr tablet TAKE 1 TABLET BY MOUTH ONCE DAILY WITH MEALS OR  IMMEDIATELY  FOLLOWING 90 tablet 1   Multiple Vitamins-Minerals (MULTIVITAMIN WITH MINERALS) tablet Take 1 tablet by mouth daily.     nitroGLYCERIN (NITROSTAT) 0.4 MG SL tablet Place 1 tablet (0.4 mg total) under the tongue every 5 (five) minutes as needed. For chest pain 25 tablet 6   Omega-3 Fatty Acids (FISH OIL) 1000 MG CAPS Take 2 capsules (2,000 mg total) by mouth in the morning and at bedtime. 120 capsule 1   omeprazole (PRILOSEC) 20 MG capsule Take 1 capsule (20 mg total) by mouth daily. Prolonged use may damage the kidney 90 capsule 1   prochlorperazine (COMPAZINE) 10 MG tablet Take 1 tablet (10 mg total) by mouth every 6 (six) hours as needed for nausea or vomiting. 30 tablet 0   Vitamin A 2400 MCG (8000 UT) TABS Take 2,400 mg by mouth daily.     vitamin B-12 (CYANOCOBALAMIN) 1000 MCG tablet Take 1,000 mcg by mouth daily.     No current facility-administered medications for this visit.    LABS: Lab Results  Component Value Date   WBC 2.4 (L) 09/28/2021   HGB 10.0  (L) 09/28/2021   HCT 29.5 (L) 09/28/2021   MCV 103.5 (H) 09/28/2021   PLT 108 (L) 09/28/2021      Component Value Date/Time   NA 139 09/28/2021 1208   NA 141 04/12/2021 1356   K 3.8 09/28/2021 1208  CL 105 09/28/2021 1208   CO2 27 09/28/2021 1208   GLUCOSE 84 09/28/2021 1208   BUN 18 09/28/2021 1208   BUN 13 04/12/2021 1356   CREATININE 0.78 09/28/2021 1208   CREATININE 0.99 08/29/2016 1130   CALCIUM 8.9 09/28/2021 1208   GFRNONAA >60 09/28/2021 1208   GFRNONAA 59 (L) 08/29/2016 1130   GFRAA 50 (L) 07/06/2020 1220   GFRAA 68 08/29/2016 1130   Lab Results  Component Value Date   INR 0.87 04/25/2012   INR 0.95 10/03/2011   No results found for: PTT  Social History   Socioeconomic History   Marital status: Married    Spouse name: Not on file   Number of children: Not on file   Years of education: Not on file   Highest education level: Not on file  Occupational History   Not on file  Tobacco Use   Smoking status: Every Day    Packs/day: 1.50    Years: 58.00    Pack years: 87.00    Types: Cigarettes    Start date: 11/06/1957   Smokeless tobacco: Never   Tobacco comments:    Has smoked 3 ppd, current 1.5-2 ppd  Vaping Use   Vaping Use: Never used  Substance and Sexual Activity   Alcohol use: No   Drug use: No   Sexual activity: Yes    Birth control/protection: Post-menopausal  Other Topics Concern   Not on file  Social History Writer.  Some college.  Currently retired.  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 worked in [employer] most recently.      Lives with husband and 2 adult daughters and 4 grandchildren.   Social Determinants of Health   Financial Resource Strain: Not on file  Food Insecurity: Not on file  Transportation Needs: Not on file  Physical Activity: Not on file  Stress: Not on file  Social Connections: Not on file  Intimate Partner Violence: Not on file   Family History  Problem Relation Age of Onset    Cancer Mother 61   Diabetes Mother    Alcohol abuse Father    Asthma Father    Diabetes Brother    Lung cancer Brother    Ovarian cancer Maternal Aunt    Liver cancer Paternal Aunt    Heart disease Neg Hx      REVIEW OF SYSTEMS:  Reviewed with the patient as per HPI. Psych: Patient denies having dental phobia.   VITAL SIGNS: BP (!) 142/61 (BP Location: Right Arm, Patient Position: Sitting, Cuff Size: Normal)   Pulse 63   Temp 98.3 F (36.8 C) (Oral)    PHYSICAL EXAM: General:  Well-developed, comfortable and in no apparent distress. Neurological:  Alert and oriented to person, place and  time. Extraoral:  Facial symmetry present without any edema or erythema.  No swelling or lymphadenopathy.  TMJ asymptomatic without clicks or crepitations.  Intraoral:  Soft tissues appear well-perfused and mucous membranes moist.  FOM and vestibules soft and not raised. Oral cavity without mass or lesion. (+) Parulis noted near apices of #3 retained root tips and region (++) palpation; no purulence or drainage evident upon examination.   DENTAL EXAM: Limited hard tissue exam completed and charted.    Overall impression:  Poor remaining dentition.    Oral hygiene:  Poor  Periodontal:  Inflamed and erythematous gingival tissue; generalized plaque and calculus accumulation. Caries:  #2, #6, #9, #11, #22, #23, #24, #30  Retained root tips:  #3, #5, #7, #8, #10, #12, #19 Defective restorations:  #4 previously root canal treated with signs of recurrent apical infection; #30 existing crown with recurrent decay Endodontics:   #4 has had previous root canal therapy Removable/fixed prosthodontics:  Patient denies wearing partial dentures.  #4 and #31 have existing full-coverage crowns. Occlusion:  Unable to assess occlusion.   Non-functional teeth:  #2   RADIOGRAPHIC EXAM:  PAN, 1 BW and 1 periapical image exposed and interpreted.     Condyles seated bilaterally in fossas.  No evidence of  abnormal pathology.  All visualized osseous structures appear WNL.  Retained root tips- #3, #5, #7, #8, #10, #12 & #19; #19 has periapical radiolucency.  Existing restoration evident on #18.  Missing several teeth.  Rampant decay in all 4 quadrants.    #2, #6, #30 caries.  #3, #5 & #7 retained root tips w/ periapical radiolucencies.  #4 has been previously endodontically treated with deficient gutta percha fill & has periapical radiolucency which may be indicative of recurrent infection vs external root resorption. #4 & #30 have full-coverage restorations; #30 has recurrent decay on M&D.    ASSESSMENT:  1.  Lung cancer with metastasis to brain 2.  Currently undergoing chemotherapy 3.  Limited dental examination 4.  Caries 5.  Retained root tips 6.  Chronic apical periodontitis 7.  Periapical abscess with sinus 8.  Chronic periodontitis 9.  Defective dental restorations 10.  Accretions on teeth 11.  Missing teeth 12.  Postoperative bleeding risk   PLAN AND RECOMMENDATIONS: I discussed the risks, benefits, and complications of various scenarios with the patient in relationship to their medical and dental conditions.  I explained all significant findings of the dental consultation with the patient including multiple teeth that have severe caries, broken down teeth/retained root tips and non-restorable teeth, as well as her chief complaint of recurrent infection in the upper right quadrant which could likely be from several teeth including #3, #4 or #5, and the recommended care including extractions of at least all grossly decayed or not restorable teeth on the top right in order to eliminate the current source of acute/chronic infection, as well as finding a dental provider to establish care with so that she can receive comprehensive dental care and avoid future complications such as pain and infection and in order to optimize her oral health.  The patient verbalized understanding of all findings,  discussion, and recommendations. We then discussed various treatment options to include no treatment, multiple extractions with alveoloplasty, pre-prosthetic surgery as indicated, periodontal therapy, dental restorations, root canal therapy, crown and bridge therapy, implant therapy, and replacement of missing teeth as indicated.  The patient verbalized understanding of all options, and currently wishes to proceed with extractions of teeth in the upper right quadrant that are infected/could be the source of her past acute infection (teeth numbers 3, 4, 5 & 7).  She understands that she does have several other teeth that need to be extracted, however recommend that she find a dental provider who will develop a treatment plan for her and provider comprehensive care.  She verbalized understanding and is agreeable to this plan. After we discussed options for moving forward with treatment including having extractions completed at the hospital under general anesthesia vs referral out to an oral surgeon for extractions (under local anesthesia or IV sedation as indicated), she elected to be referred out and prefers to not undergo general anesthesia.  Referral will be placed to [facility] and the patient understands that she will be contacted for an appointment. Plan to discuss all findings and recommendations with medical team and coordinate future care as needed.   The patient will need to establish care at a dental office of her choice for routine dental care including extractions of other indicated teeth, replacement of missing teeth as needed, cleanings and exams. Rx written for Amoxicillin for patient to have in case her infection flares up before she has teeth extracted.  Instructed her to contact our clinic should she have any questions or concerns.  All questions and concerns were invited and addressed.  The patient tolerated today's visit well and departed in stable condition.  I spent in  excess of 120 minutes during the conduct of this consultation and >50% of this time involved direct face-to-face encounter for counseling and/or coordination of the patient's care.      Martin Benson [place], D.M.D.

## 2021-10-07 DIAGNOSIS — K08109 Complete loss of teeth, unspecified cause, unspecified class: Secondary | ICD-10-CM | POA: Insufficient documentation

## 2021-10-07 DIAGNOSIS — K036 Deposits [accretions] on teeth: Secondary | ICD-10-CM | POA: Insufficient documentation

## 2021-10-07 DIAGNOSIS — Z0120 Encounter for dental examination and cleaning without abnormal findings: Secondary | ICD-10-CM | POA: Insufficient documentation

## 2021-10-07 DIAGNOSIS — K083 Retained dental root: Secondary | ICD-10-CM | POA: Insufficient documentation

## 2021-10-07 DIAGNOSIS — K0530 Chronic periodontitis, unspecified: Secondary | ICD-10-CM | POA: Insufficient documentation

## 2021-10-07 DIAGNOSIS — K029 Dental caries, unspecified: Secondary | ICD-10-CM | POA: Insufficient documentation

## 2021-10-07 DIAGNOSIS — K045 Chronic apical periodontitis: Secondary | ICD-10-CM | POA: Insufficient documentation

## 2021-10-07 DIAGNOSIS — K0850 Unsatisfactory restoration of tooth, unspecified: Secondary | ICD-10-CM | POA: Insufficient documentation

## 2021-10-07 MED ORDER — AMOXICILLIN 500 MG PO CAPS: 500.0000 mg | ORAL_CAPSULE | Freq: Three times a day (TID) | ORAL | 0 refills | Status: AC

## 2021-10-11 ENCOUNTER — Inpatient Hospital Stay (HOSPITAL_BASED_OUTPATIENT_CLINIC_OR_DEPARTMENT_OTHER): Payer: Medicare HMO | Admitting: Internal Medicine

## 2021-10-11 ENCOUNTER — Encounter: Payer: Self-pay | Admitting: Internal Medicine

## 2021-10-11 ENCOUNTER — Inpatient Hospital Stay: Payer: Medicare HMO

## 2021-10-11 ENCOUNTER — Inpatient Hospital Stay: Payer: Medicare HMO | Attending: Internal Medicine

## 2021-10-11 ENCOUNTER — Other Ambulatory Visit: Payer: Self-pay

## 2021-10-11 VITALS — BP 124/70 | HR 78 | Temp 97.2°F | Resp 19 | Ht 67.0 in | Wt 134.2 lb

## 2021-10-11 DIAGNOSIS — Z79899 Other long term (current) drug therapy: Secondary | ICD-10-CM | POA: Insufficient documentation

## 2021-10-11 DIAGNOSIS — E039 Hypothyroidism, unspecified: Secondary | ICD-10-CM | POA: Diagnosis not present

## 2021-10-11 DIAGNOSIS — C3412 Malignant neoplasm of upper lobe, left bronchus or lung: Secondary | ICD-10-CM | POA: Diagnosis not present

## 2021-10-11 DIAGNOSIS — J449 Chronic obstructive pulmonary disease, unspecified: Secondary | ICD-10-CM | POA: Insufficient documentation

## 2021-10-11 DIAGNOSIS — C7931 Secondary malignant neoplasm of brain: Secondary | ICD-10-CM | POA: Insufficient documentation

## 2021-10-11 DIAGNOSIS — Z8719 Personal history of other diseases of the digestive system: Secondary | ICD-10-CM | POA: Insufficient documentation

## 2021-10-11 DIAGNOSIS — T451X5A Adverse effect of antineoplastic and immunosuppressive drugs, initial encounter: Secondary | ICD-10-CM | POA: Diagnosis not present

## 2021-10-11 DIAGNOSIS — I252 Old myocardial infarction: Secondary | ICD-10-CM | POA: Insufficient documentation

## 2021-10-11 DIAGNOSIS — Z5112 Encounter for antineoplastic immunotherapy: Secondary | ICD-10-CM | POA: Diagnosis not present

## 2021-10-11 DIAGNOSIS — E785 Hyperlipidemia, unspecified: Secondary | ICD-10-CM | POA: Diagnosis not present

## 2021-10-11 DIAGNOSIS — Z882 Allergy status to sulfonamides status: Secondary | ICD-10-CM | POA: Insufficient documentation

## 2021-10-11 DIAGNOSIS — Z87440 Personal history of urinary (tract) infections: Secondary | ICD-10-CM | POA: Diagnosis not present

## 2021-10-11 DIAGNOSIS — R5383 Other fatigue: Secondary | ICD-10-CM | POA: Insufficient documentation

## 2021-10-11 DIAGNOSIS — D701 Agranulocytosis secondary to cancer chemotherapy: Secondary | ICD-10-CM | POA: Insufficient documentation

## 2021-10-11 DIAGNOSIS — Z888 Allergy status to other drugs, medicaments and biological substances status: Secondary | ICD-10-CM | POA: Diagnosis not present

## 2021-10-11 DIAGNOSIS — Z5111 Encounter for antineoplastic chemotherapy: Secondary | ICD-10-CM

## 2021-10-11 DIAGNOSIS — Z880 Allergy status to penicillin: Secondary | ICD-10-CM | POA: Insufficient documentation

## 2021-10-11 DIAGNOSIS — Z923 Personal history of irradiation: Secondary | ICD-10-CM | POA: Insufficient documentation

## 2021-10-11 DIAGNOSIS — C3490 Malignant neoplasm of unspecified part of unspecified bronchus or lung: Secondary | ICD-10-CM

## 2021-10-11 LAB — CBC WITH DIFFERENTIAL (CANCER CENTER ONLY)
Abs Immature Granulocytes: 0.01 10*3/uL (ref 0.00–0.07)
Basophils Absolute: 0 10*3/uL (ref 0.0–0.1)
Basophils Relative: 1 %
Eosinophils Absolute: 0.1 10*3/uL (ref 0.0–0.5)
Eosinophils Relative: 4 %
HCT: 32.6 % — ABNORMAL LOW (ref 36.0–46.0)
Hemoglobin: 10.5 g/dL — ABNORMAL LOW (ref 12.0–15.0)
Immature Granulocytes: 0 %
Lymphocytes Relative: 36 %
Lymphs Abs: 0.9 10*3/uL (ref 0.7–4.0)
MCH: 34.7 pg — ABNORMAL HIGH (ref 26.0–34.0)
MCHC: 32.2 g/dL (ref 30.0–36.0)
MCV: 107.6 fL — ABNORMAL HIGH (ref 80.0–100.0)
Monocytes Absolute: 0.5 10*3/uL (ref 0.1–1.0)
Monocytes Relative: 18 %
Neutro Abs: 1.1 10*3/uL — ABNORMAL LOW (ref 1.7–7.7)
Neutrophils Relative %: 41 %
Platelet Count: 238 10*3/uL (ref 150–400)
RBC: 3.03 MIL/uL — ABNORMAL LOW (ref 3.87–5.11)
RDW: 15.5 % (ref 11.5–15.5)
WBC Count: 2.6 10*3/uL — ABNORMAL LOW (ref 4.0–10.5)
nRBC: 0 % (ref 0.0–0.2)

## 2021-10-11 LAB — CMP (CANCER CENTER ONLY)
ALT: 16 U/L (ref 0–44)
AST: 22 U/L (ref 15–41)
Albumin: 3.6 g/dL (ref 3.5–5.0)
Alkaline Phosphatase: 75 U/L (ref 38–126)
Anion gap: 11 (ref 5–15)
BUN: 11 mg/dL (ref 8–23)
CO2: 24 mmol/L (ref 22–32)
Calcium: 8.8 mg/dL — ABNORMAL LOW (ref 8.9–10.3)
Chloride: 108 mmol/L (ref 98–111)
Creatinine: 0.97 mg/dL (ref 0.44–1.00)
GFR, Estimated: >60 mL/min (ref >60)
Glucose, Bld: 117 mg/dL — ABNORMAL HIGH (ref 70–99)
Potassium: 3.8 mmol/L (ref 3.5–5.1)
Sodium: 143 mmol/L (ref 135–145)
Total Bilirubin: 0.3 mg/dL (ref 0.3–1.2)
Total Protein: 6.7 g/dL (ref 6.5–8.1)

## 2021-10-11 NOTE — Progress Notes (Signed)
[facility] [phone]   [phone]  OFFICE PROGRESS NOTE  [name], MD [place] [address]  DIAGNOSIS: Stage IV (T2 a, N2, M1b) non-small cell lung cancer, adenocarcinoma presented with left upper lobe lung mass in addition to left hilar and precarinal metastatic adenopathy and small left lower lobe pulmonary nodule in addition to solitary left frontal brain metastasis diagnosed in July 2022.  Molecular studies by [facility] showed  Positive KRAS G12C mutation PD-L1 expression was less than 1%   PRIOR THERAPY:  1) Status post SRS to the solitary brain metastasis. 20 Treatment for the locally advanced disease in the chest with carboplatin for AUC of 2 and paclitaxel 45 Mg/M2.  Status post 6 cycles.  Last dose was given 07/25/2021.  CURRENT THERAPY: Systemic chemotherapy with carboplatin for AUC of 5, Alimta 500 Mg/M2 and Keytruda 200 Mg IV every 3 weeks.  First dose August 31, 2021.  Status post 2 cycles.  INTERVAL HISTORY: Jill [name] 70 y.o. female returns to the clinic today for follow-up visit.  The patient is feeling fine today with no concerning complaints.  She has been tolerating her treatment with systemic chemotherapy with carboplatin, Alimta and Keytruda fairly well.  She has chemotherapy-induced neutropenia.  She denied having any current chest pain, shortness of breath, cough or hemoptysis.  She denied having any fever or chills.  She has no nausea, vomiting, diarrhea or constipation.  She has no headache or visual changes.  She was supposed to start cycle #3 of her treatment today.   MEDICAL HISTORY: Past Medical History:  Diagnosis Date   Anxiety    ON PAXIL, XANAX   AVM (arteriovenous malformation) brain    s/p stent/coil   Blood transfusion    x 2   Cancer (HCC)    Left lung   COPD (chronic obstructive pulmonary disease) (HCC)    no inhaler, no oxygen   Coronary artery disease     Prior inferior MI with stent to RCA, s/p CABG in 2008   Depression    Dizziness    Dyspnea    with exertion, no oxygen   Fatigue    Foot injury 06/01/2017   right - RESOLVED, no longer an issue per patient 05/18/21   GERD (gastroesophageal reflux disease)    Headache(784.0)    UNRUPTURED CEREBRAL ANEURYSM   Hyperlipidemia    Hypertension    Hypothyroidism    Infected cyst of skin 09/18/2013   Left knee pain 06/05/2012   Lung mass    Left lung   Myocardial infarction ([facility])    Normal nuclear stress test Ju;y 2012   No ischemia. EF 70%; fixed defect involving septum, inferoseptal and inferior wall   Pain, dental 02/06/2017   Poor dental hygiene    Retroperitoneal bleeding    Following cardiac cath   Tobacco abuse    Urine discoloration 09/18/2013   UTI (lower urinary tract infection) 10/16/2013    ALLERGIES:  is allergic to varenicline, ace inhibitors, prednisone, penicillins, and sulfa drugs cross reactors.  MEDICATIONS:  Current Outpatient Medications  Medication Sig Dispense Refill   amLODipine (NORVASC) 10 MG tablet Take 1 tablet by mouth once daily 90 tablet 1   amoxicillin (AMOXIL) 500 MG capsule Take 1 capsule (500 mg total) by mouth 3 (three) times daily for 7 days. Take two capsules now, then one capsule by mouth every 8 hours until all gone. 21 capsule 0  amoxicillin-clavulanate (AUGMENTIN) 875-125 MG tablet Take 1 tablet by mouth 2 (two) times daily. 12 tablet 0   aspirin EC 81 MG tablet Take 1 tablet (81 mg total) by mouth daily. 90 tablet 2   atorvastatin (LIPITOR) 40 MG tablet Take 1 tablet by mouth once daily 90 tablet 1   Cholecalciferol (VITAMIN D3) 250 MCG (10000 UT) capsule Take 10,000 mcg by mouth daily.     diphenhydramine-acetaminophen (TYLENOL PM) 25-500 MG TABS tablet Take 1 tablet by mouth every other day. At bedtime     escitalopram (LEXAPRO) 10 MG tablet Take 1 tablet by mouth once daily 90 tablet 1   folic acid (FOLVITE) 1 MG tablet Take 1 tablet (1  mg total) by mouth daily. 30 tablet 4   levothyroxine (SYNTHROID) 137 MCG tablet Take 1 tablet by mouth once daily 90 tablet 1   losartan (COZAAR) 100 MG tablet Take 1 tablet by mouth once daily 90 tablet 1   metoprolol succinate (TOPROL-XL) 50 MG 24 hr tablet TAKE 1 TABLET BY MOUTH ONCE DAILY WITH MEALS OR  IMMEDIATELY  FOLLOWING 90 tablet 1   Multiple Vitamins-Minerals (MULTIVITAMIN WITH MINERALS) tablet Take 1 tablet by mouth daily.     nitroGLYCERIN (NITROSTAT) 0.4 MG SL tablet Place 1 tablet (0.4 mg total) under the tongue every 5 (five) minutes as needed. For chest pain 25 tablet 6   Omega-3 Fatty Acids (FISH OIL) 1000 MG CAPS Take 2 capsules (2,000 mg total) by mouth in the morning and at bedtime. 120 capsule 1   omeprazole (PRILOSEC) 20 MG capsule Take 1 capsule (20 mg total) by mouth daily. Prolonged use may damage the kidney 90 capsule 1   prochlorperazine (COMPAZINE) 10 MG tablet Take 1 tablet (10 mg total) by mouth every 6 (six) hours as needed for nausea or vomiting. 30 tablet 0   Vitamin A 2400 MCG (8000 UT) TABS Take 2,400 mg by mouth daily.     vitamin B-12 (CYANOCOBALAMIN) 1000 MCG tablet Take 1,000 mcg by mouth daily.     No current facility-administered medications for this visit.    SURGICAL HISTORY:  Past Surgical History:  Procedure Laterality Date   CARDIAC CATHETERIZATION  01/02/2007   IT REVEALS MILD INFERIOR WALL HYPOKINESIS. THE EJECTION FRACTION IS AROUND 50%   COLONOSCOPY     CORONARY ARTERY BYPASS GRAFT  11/06/2006   LIMA to LAD, SVG to DX, SVG to LCX & SVG to OM 1 & 2, and SVG to PD   CORONARY STENT PLACEMENT     Remote past stent to RCA   EYE SURGERY Right    cataracts removed   HEMORRHOID SURGERY  11/07/1987   UPPER GI ENDOSCOPY     VENTRICULOSTOMY  10/06/2011   Procedure: VENTRICULOSTOMY;  Surgeon: Winfield Cunas;  Location: [facility];  Service: Neurosurgery;  Laterality: Right;  Insertion of Ventriculostomy Catheter   VIDEO BRONCHOSCOPY WITH  ENDOBRONCHIAL NAVIGATION Left 05/20/2021   Procedure: VIDEO BRONCHOSCOPY WITH ENDOBRONCHIAL NAVIGATION;  Surgeon: Garner Nash, DO;  Location: [facility];  Service: Pulmonary;  Laterality: Left;   VIDEO BRONCHOSCOPY WITH ENDOBRONCHIAL ULTRASOUND Bilateral 05/20/2021   Procedure: VIDEO BRONCHOSCOPY WITH ENDOBRONCHIAL ULTRASOUND;  Surgeon: Garner Nash, DO;  Location: [facility];  Service: Pulmonary;  Laterality: Bilateral;   WISDOM TOOTH EXTRACTION      REVIEW OF SYSTEMS:  A comprehensive review of systems was negative.   PHYSICAL EXAMINATION: General appearance: alert, cooperative, fatigued, and no distress Head: Normocephalic, without obvious abnormality, atraumatic Neck:  no adenopathy, no JVD, supple, symmetrical, trachea midline, and thyroid not enlarged, symmetric, no tenderness/mass/nodules Lymph nodes: Cervical, supraclavicular, and axillary nodes normal. Resp: clear to auscultation bilaterally Back: symmetric, no curvature. ROM normal. No CVA tenderness. Cardio: regular rate and rhythm, S1, S2 normal, no murmur, click, rub or gallop GI: soft, non-tender; bowel sounds normal; no masses,  no organomegaly Extremities: extremities normal, atraumatic, no cyanosis or edema  ECOG PERFORMANCE STATUS: 1 - Symptomatic but completely ambulatory  Blood pressure 124/70, pulse 78, temperature (!) 97.2 F (36.2 C), temperature source Tympanic, resp. rate 19, height '5\' 7"'  (1.702 m), weight 134 lb 3.2 oz (60.9 kg), SpO2 96 %.  LABORATORY DATA: Lab Results  Component Value Date   WBC 2.6 (L) 10/11/2021   HGB 10.5 (L) 10/11/2021   HCT 32.6 (L) 10/11/2021   MCV 107.6 (H) 10/11/2021   PLT 238 10/11/2021      Chemistry      Component Value Date/Time   NA 143 10/11/2021 1110   NA 141 04/12/2021 1356   K 3.8 10/11/2021 1110   CL 108 10/11/2021 1110   CO2 24 10/11/2021 1110   BUN 11 10/11/2021 1110   BUN 13 04/12/2021 1356   CREATININE 0.97 10/11/2021 1110   CREATININE 0.99 08/29/2016 1130       Component Value Date/Time   CALCIUM 8.8 (L) 10/11/2021 1110   ALKPHOS 75 10/11/2021 1110   AST 22 10/11/2021 1110   ALT 16 10/11/2021 1110   BILITOT 0.3 10/11/2021 1110       RADIOGRAPHIC STUDIES: MR Brain W Wo Contrast  Result Date: 09/16/2021 CLINICAL DATA:  Brain/CNS neoplasm, surveillance. Follow-up on treated metastatic lung cancer. EXAM: MRI HEAD WITHOUT AND WITH CONTRAST TECHNIQUE: Multiplanar, multiecho pulse sequences of the brain and surrounding structures were obtained without and with intravenous contrast. CONTRAST:  46m MULTIHANCE GADOBENATE DIMEGLUMINE 529 MG/ML IV SOLN COMPARISON:  MR head 06/09/2021. FINDINGS: Brain: New 3 mm left occipital lesion with minimal edema (series 11, image 61). New punctate 1 mm lesion along the left temporal operculum (series 11, image 82). Stable 6 mm lesion of the left superior frontal gyrus. Increase in mild surrounding edema. Prior right frontal ventricular catheter tract is again identified with chronic blood products and gliosis. Stable additional patchy foci of T2 hyperintensity in the supratentorial white matter likely reflecting chronic microvascular ischemic changes. Unchanged mild dural enhancement. There is no acute infarction.  No hydrocephalus. Vascular: There is artifact at the level of treated basilar tip aneurysm. Major vessel flow voids at the skull base are preserved. Skull and upper cervical spine: Normal marrow signal is preserved. Sinuses/Orbits: Mild mucosal thickening.  Right lens replacement. Other: Sella is unremarkable.  Mastoid air cells are clear. IMPRESSION: New 3 mm left occipital and 1 mm left temporal metastases. Stable left frontal metastasis with increased mild edema. Electronically Signed   By: PMacy MisM.D.   On: 09/16/2021 10:00    ASSESSMENT AND PLAN: This is a very pleasant 70years old white female diagnosed with a stage IV (T2 a, N2, M1 B) non-small cell lung cancer, adenocarcinoma presented with left  upper lobe lung mass in addition to left hilar and precarinal metastatic adenopathy as well as small left lower lobe pulmonary nodule and solitary left frontal brain metastasis diagnosed in July 2022.  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 The patient is status post SRS to the solitary brain metastasis. She underwent a course of concurrent chemoradiation  to the locally advanced disease in the chest with carboplatin for AUC of 2 and paclitaxel 45 Mg/M2 status post 6 cycles.  The patient has been tolerating her treatment well with no concerning adverse effects. Her scan showed mild improvement of her disease with no concerning findings for progression. Technically the patient has a stage IV lung cancer with brain metastasis  The patient is currently undergoing systemic chemotherapy with carboplatin for AUC of 5, Alimta 500 Mg/M2 and Keytruda 200 Mg IV every 3 weeks status post 2 cycles.  She has been tolerating the treatment well except for chemotherapy-induced neutropenia. Her CBC today showed absolute neutrophil count of 1100 and this will not be enough for her to proceed with the treatment today as planned.  I gave her the option of proceeding with the treatment but receiving Neulasta injection 2 days after the treatment versus delaying her treatment by 1 week.  The patient would like to delay the treatment by 1 week until recovery of her blood count. She will start cycle #3 next week. I will see her back for follow-up visit in 4 weeks for evaluation with repeat CT scan of the chest for restaging of her disease. The patient was advised to call immediately if she has any other concerning symptoms in the interval. The patient voices understanding of current disease status and treatment options and is in agreement with the current care plan.  All questions were answered. The patient knows to call the clinic with any problems, questions or concerns. We can  certainly see the patient much sooner if necessary.  The total time spent in the appointment was 20 minutes.  Disclaimer: This note was dictated with voice recognition software. Similar sounding words can inadvertently be transcribed and may not be corrected upon review.

## 2021-10-12 ENCOUNTER — Ambulatory Visit: Payer: Medicare HMO

## 2021-10-12 ENCOUNTER — Ambulatory Visit: Payer: Medicare HMO | Admitting: Internal Medicine

## 2021-10-12 ENCOUNTER — Other Ambulatory Visit: Payer: Medicare HMO

## 2021-10-13 ENCOUNTER — Telehealth: Payer: Self-pay | Admitting: Internal Medicine

## 2021-10-13 NOTE — Telephone Encounter (Signed)
Sch per 12/7 los, left msg with pt.

## 2021-10-17 MED FILL — Fosaprepitant Dimeglumine For IV Infusion 150 MG (Base Eq): INTRAVENOUS | Qty: 5 | Status: AC

## 2021-10-17 MED FILL — Dexamethasone Sodium Phosphate Inj 100 MG/10ML: INTRAMUSCULAR | Qty: 1 | Status: AC

## 2021-10-18 ENCOUNTER — Inpatient Hospital Stay: Payer: Medicare HMO

## 2021-10-18 ENCOUNTER — Other Ambulatory Visit: Payer: Medicare HMO

## 2021-10-18 ENCOUNTER — Other Ambulatory Visit: Payer: Self-pay

## 2021-10-18 VITALS — BP 121/69 | HR 70 | Temp 98.1°F | Resp 17 | Wt 132.4 lb

## 2021-10-18 DIAGNOSIS — C7931 Secondary malignant neoplasm of brain: Secondary | ICD-10-CM | POA: Diagnosis not present

## 2021-10-18 DIAGNOSIS — I252 Old myocardial infarction: Secondary | ICD-10-CM | POA: Diagnosis not present

## 2021-10-18 DIAGNOSIS — D701 Agranulocytosis secondary to cancer chemotherapy: Secondary | ICD-10-CM | POA: Diagnosis not present

## 2021-10-18 DIAGNOSIS — C3412 Malignant neoplasm of upper lobe, left bronchus or lung: Secondary | ICD-10-CM

## 2021-10-18 DIAGNOSIS — Z888 Allergy status to other drugs, medicaments and biological substances status: Secondary | ICD-10-CM | POA: Diagnosis not present

## 2021-10-18 DIAGNOSIS — J449 Chronic obstructive pulmonary disease, unspecified: Secondary | ICD-10-CM | POA: Diagnosis not present

## 2021-10-18 DIAGNOSIS — Z5112 Encounter for antineoplastic immunotherapy: Secondary | ICD-10-CM | POA: Diagnosis not present

## 2021-10-18 DIAGNOSIS — Z882 Allergy status to sulfonamides status: Secondary | ICD-10-CM | POA: Diagnosis not present

## 2021-10-18 DIAGNOSIS — Z8719 Personal history of other diseases of the digestive system: Secondary | ICD-10-CM | POA: Diagnosis not present

## 2021-10-18 DIAGNOSIS — Z87440 Personal history of urinary (tract) infections: Secondary | ICD-10-CM | POA: Diagnosis not present

## 2021-10-18 DIAGNOSIS — E039 Hypothyroidism, unspecified: Secondary | ICD-10-CM | POA: Diagnosis not present

## 2021-10-18 DIAGNOSIS — Z880 Allergy status to penicillin: Secondary | ICD-10-CM | POA: Diagnosis not present

## 2021-10-18 DIAGNOSIS — T451X5A Adverse effect of antineoplastic and immunosuppressive drugs, initial encounter: Secondary | ICD-10-CM | POA: Diagnosis not present

## 2021-10-18 DIAGNOSIS — Z5111 Encounter for antineoplastic chemotherapy: Secondary | ICD-10-CM | POA: Diagnosis not present

## 2021-10-18 DIAGNOSIS — R5383 Other fatigue: Secondary | ICD-10-CM | POA: Diagnosis not present

## 2021-10-18 DIAGNOSIS — Z79899 Other long term (current) drug therapy: Secondary | ICD-10-CM | POA: Diagnosis not present

## 2021-10-18 DIAGNOSIS — Z923 Personal history of irradiation: Secondary | ICD-10-CM | POA: Diagnosis not present

## 2021-10-18 DIAGNOSIS — E785 Hyperlipidemia, unspecified: Secondary | ICD-10-CM | POA: Diagnosis not present

## 2021-10-18 LAB — CBC WITH DIFFERENTIAL (CANCER CENTER ONLY)
Abs Immature Granulocytes: 0.01 10*3/uL (ref 0.00–0.07)
Basophils Absolute: 0 10*3/uL (ref 0.0–0.1)
Basophils Relative: 1 %
Eosinophils Absolute: 0.1 10*3/uL (ref 0.0–0.5)
Eosinophils Relative: 1 %
HCT: 34.4 % — ABNORMAL LOW (ref 36.0–46.0)
Hemoglobin: 11.5 g/dL — ABNORMAL LOW (ref 12.0–15.0)
Immature Granulocytes: 0 %
Lymphocytes Relative: 13 %
Lymphs Abs: 0.8 10*3/uL (ref 0.7–4.0)
MCH: 35.6 pg — ABNORMAL HIGH (ref 26.0–34.0)
MCHC: 33.4 g/dL (ref 30.0–36.0)
MCV: 106.5 fL — ABNORMAL HIGH (ref 80.0–100.0)
Monocytes Absolute: 0.5 10*3/uL (ref 0.1–1.0)
Monocytes Relative: 9 %
Neutro Abs: 4.4 10*3/uL (ref 1.7–7.7)
Neutrophils Relative %: 76 %
Platelet Count: 223 10*3/uL (ref 150–400)
RBC: 3.23 MIL/uL — ABNORMAL LOW (ref 3.87–5.11)
RDW: 15.2 % (ref 11.5–15.5)
WBC Count: 5.8 10*3/uL (ref 4.0–10.5)
nRBC: 0 % (ref 0.0–0.2)

## 2021-10-18 LAB — CMP (CANCER CENTER ONLY)
ALT: 13 U/L (ref 0–44)
AST: 17 U/L (ref 15–41)
Albumin: 3.6 g/dL (ref 3.5–5.0)
Alkaline Phosphatase: 74 U/L (ref 38–126)
Anion gap: 6 (ref 5–15)
BUN: 14 mg/dL (ref 8–23)
CO2: 25 mmol/L (ref 22–32)
Calcium: 9.2 mg/dL (ref 8.9–10.3)
Chloride: 109 mmol/L (ref 98–111)
Creatinine: 0.81 mg/dL (ref 0.44–1.00)
GFR, Estimated: >60 mL/min (ref >60)
Glucose, Bld: 86 mg/dL (ref 70–99)
Potassium: 4.1 mmol/L (ref 3.5–5.1)
Sodium: 140 mmol/L (ref 135–145)
Total Bilirubin: 0.4 mg/dL (ref 0.3–1.2)
Total Protein: 7.1 g/dL (ref 6.5–8.1)

## 2021-10-18 LAB — TSH: TSH: 0.264 u[IU]/mL — ABNORMAL LOW (ref 0.308–3.960)

## 2021-10-18 MED ORDER — PALONOSETRON HCL INJECTION 0.25 MG/5ML
0.2500 mg | Freq: Once | INTRAVENOUS | Status: AC
  Administered 2021-10-18: 0.25 mg via INTRAVENOUS
  Filled 2021-10-18: qty 5

## 2021-10-18 MED ORDER — CYANOCOBALAMIN 1000 MCG/ML IJ SOLN
1000.0000 ug | Freq: Once | INTRAMUSCULAR | Status: AC
  Administered 2021-10-18: 1000 ug via INTRAMUSCULAR
  Filled 2021-10-18: qty 1

## 2021-10-18 MED ORDER — SODIUM CHLORIDE 0.9 % IV SOLN
150.0000 mg | Freq: Once | INTRAVENOUS | Status: AC
  Administered 2021-10-18: 150 mg via INTRAVENOUS
  Filled 2021-10-18: qty 150

## 2021-10-18 MED ORDER — SODIUM CHLORIDE 0.9 % IV SOLN
500.0000 mg/m2 | Freq: Once | INTRAVENOUS | Status: AC
  Administered 2021-10-18: 900 mg via INTRAVENOUS
  Filled 2021-10-18: qty 20

## 2021-10-18 MED ORDER — SODIUM CHLORIDE 0.9 % IV SOLN
200.0000 mg | Freq: Once | INTRAVENOUS | Status: AC
  Administered 2021-10-18: 200 mg via INTRAVENOUS
  Filled 2021-10-18: qty 8

## 2021-10-18 MED ORDER — SODIUM CHLORIDE 0.9 % IV SOLN
Freq: Once | INTRAVENOUS | Status: AC
Start: 2021-10-18 — End: 2021-10-18

## 2021-10-18 MED ORDER — SODIUM CHLORIDE 0.9 % IV SOLN
369.0000 mg | Freq: Once | INTRAVENOUS | Status: AC
  Administered 2021-10-18: 370 mg via INTRAVENOUS
  Filled 2021-10-18: qty 37

## 2021-10-18 MED ORDER — SODIUM CHLORIDE 0.9 % IV SOLN
10.0000 mg | Freq: Once | INTRAVENOUS | Status: AC
  Administered 2021-10-18: 10 mg via INTRAVENOUS
  Filled 2021-10-18: qty 10

## 2021-10-18 NOTE — Patient Instructions (Signed)
[facility]  Discharge Instructions: Thank you for choosing [facility] to provide your oncology and hematology care.   If you have a lab appointment with the [facility], please go directly to the [facility] and check in at the registration area.   Wear comfortable clothing and clothing appropriate for easy access to any Portacath or PICC line.   We strive to give you quality time with your provider. You may need to reschedule your appointment if you arrive late (15 or more minutes).  Arriving late affects you and other patients whose appointments are after yours.  Also, if you miss three or more appointments without notifying the office, you may be dismissed from the clinic at the providers discretion.      For prescription refill requests, have your pharmacy contact our office and allow 72 hours for refills to be completed.    Today you received the following chemotherapy and/or immunotherapy agents: Keytruda, Alimta, & Carboplatin   To help prevent nausea and vomiting after your treatment, we encourage you to take your nausea medication as directed.  BELOW ARE SYMPTOMS THAT SHOULD BE REPORTED IMMEDIATELY: *FEVER GREATER THAN 100.4 F (38 C) OR HIGHER *CHILLS OR SWEATING *NAUSEA AND VOMITING THAT IS NOT CONTROLLED WITH YOUR NAUSEA MEDICATION *UNUSUAL SHORTNESS OF BREATH *UNUSUAL BRUISING OR BLEEDING *URINARY PROBLEMS (pain or burning when urinating, or frequent urination) *BOWEL PROBLEMS (unusual diarrhea, constipation, pain near the anus) TENDERNESS IN MOUTH AND THROAT WITH OR WITHOUT PRESENCE OF ULCERS (sore throat, sores in mouth, or a toothache) UNUSUAL RASH, SWELLING OR PAIN  UNUSUAL VAGINAL DISCHARGE OR ITCHING   Items with * indicate a potential emergency and should be followed up as soon as possible or go to the Emergency Department if any problems should occur.  Please show the CHEMOTHERAPY ALERT CARD or IMMUNOTHERAPY ALERT  CARD at check-in to the Emergency Department and triage nurse.  Should you have questions after your visit or need to cancel or reschedule your appointment, please contact [facility]  Dept: [phone]  and follow the prompts.  Office hours are 8:00 a.m. to 4:30 p.m. Monday - Friday. Please note that voicemails left after 4:00 p.m. may not be returned until the following business day.  We are closed weekends and major holidays. You have access to a nurse at all times for urgent questions. Please call the main number to the clinic Dept: [phone] and follow the prompts.   For any non-urgent questions, you may also contact your provider using MyChart. We now offer e-Visits for anyone 76 and older to request care online for non-urgent symptoms. For details visit [email or web address].   Also download the MyChart app! Go to the app store, search "MyChart", open the app, select [facility], and log in with your MyChart username and password.  Due to Covid, a mask is required upon entering the hospital/clinic. If you do not have a mask, one will be given to you upon arrival. For doctor visits, patients may have 1 support person aged 39 or older with them. For treatment visits, patients cannot have anyone with them due to current Covid guidelines and our immunocompromised population.

## 2021-10-19 ENCOUNTER — Telehealth: Payer: Self-pay

## 2021-10-19 NOTE — Telephone Encounter (Signed)
Patient LVM on nurse line stating she had lab work done through the cancer center and saw her results on mychart. Patient is asking for PCP to advise on medication recommendations. Will forward to PCP for advisement.

## 2021-10-20 NOTE — Telephone Encounter (Signed)
I apologize.  I did not see where you said it could be virtual the first time.   I called patient back and she prefers to do a virtual visit.  The appointment has been changed.  Christen Bame, CMA

## 2021-10-20 NOTE — Telephone Encounter (Signed)
Pt called back and appt made.  Christen Bame, CMA

## 2021-10-23 ENCOUNTER — Other Ambulatory Visit: Payer: Self-pay

## 2021-10-25 ENCOUNTER — Other Ambulatory Visit: Payer: Self-pay

## 2021-10-25 ENCOUNTER — Inpatient Hospital Stay: Payer: Medicare HMO

## 2021-10-25 ENCOUNTER — Other Ambulatory Visit: Payer: Medicare HMO

## 2021-10-25 DIAGNOSIS — Z5111 Encounter for antineoplastic chemotherapy: Secondary | ICD-10-CM | POA: Diagnosis not present

## 2021-10-25 DIAGNOSIS — Z880 Allergy status to penicillin: Secondary | ICD-10-CM | POA: Diagnosis not present

## 2021-10-25 DIAGNOSIS — C3412 Malignant neoplasm of upper lobe, left bronchus or lung: Secondary | ICD-10-CM | POA: Diagnosis not present

## 2021-10-25 DIAGNOSIS — I252 Old myocardial infarction: Secondary | ICD-10-CM | POA: Diagnosis not present

## 2021-10-25 DIAGNOSIS — Z79899 Other long term (current) drug therapy: Secondary | ICD-10-CM | POA: Diagnosis not present

## 2021-10-25 DIAGNOSIS — E039 Hypothyroidism, unspecified: Secondary | ICD-10-CM | POA: Diagnosis not present

## 2021-10-25 DIAGNOSIS — E785 Hyperlipidemia, unspecified: Secondary | ICD-10-CM | POA: Diagnosis not present

## 2021-10-25 DIAGNOSIS — D701 Agranulocytosis secondary to cancer chemotherapy: Secondary | ICD-10-CM | POA: Diagnosis not present

## 2021-10-25 DIAGNOSIS — T451X5A Adverse effect of antineoplastic and immunosuppressive drugs, initial encounter: Secondary | ICD-10-CM | POA: Diagnosis not present

## 2021-10-25 DIAGNOSIS — J449 Chronic obstructive pulmonary disease, unspecified: Secondary | ICD-10-CM | POA: Diagnosis not present

## 2021-10-25 DIAGNOSIS — Z888 Allergy status to other drugs, medicaments and biological substances status: Secondary | ICD-10-CM | POA: Diagnosis not present

## 2021-10-25 DIAGNOSIS — C7931 Secondary malignant neoplasm of brain: Secondary | ICD-10-CM | POA: Diagnosis not present

## 2021-10-25 DIAGNOSIS — Z8719 Personal history of other diseases of the digestive system: Secondary | ICD-10-CM | POA: Diagnosis not present

## 2021-10-25 DIAGNOSIS — Z87440 Personal history of urinary (tract) infections: Secondary | ICD-10-CM | POA: Diagnosis not present

## 2021-10-25 DIAGNOSIS — Z882 Allergy status to sulfonamides status: Secondary | ICD-10-CM | POA: Diagnosis not present

## 2021-10-25 DIAGNOSIS — R5383 Other fatigue: Secondary | ICD-10-CM | POA: Diagnosis not present

## 2021-10-25 DIAGNOSIS — Z923 Personal history of irradiation: Secondary | ICD-10-CM | POA: Diagnosis not present

## 2021-10-25 DIAGNOSIS — Z5112 Encounter for antineoplastic immunotherapy: Secondary | ICD-10-CM | POA: Diagnosis not present

## 2021-10-25 LAB — CBC WITH DIFFERENTIAL (CANCER CENTER ONLY)
Abs Immature Granulocytes: 0 10*3/uL (ref 0.00–0.07)
Basophils Absolute: 0 10*3/uL (ref 0.0–0.1)
Basophils Relative: 1 %
Eosinophils Absolute: 0.1 10*3/uL (ref 0.0–0.5)
Eosinophils Relative: 4 %
HCT: 32.2 % — ABNORMAL LOW (ref 36.0–46.0)
Hemoglobin: 10.9 g/dL — ABNORMAL LOW (ref 12.0–15.0)
Immature Granulocytes: 0 %
Lymphocytes Relative: 45 %
Lymphs Abs: 0.9 10*3/uL (ref 0.7–4.0)
MCH: 36 pg — ABNORMAL HIGH (ref 26.0–34.0)
MCHC: 33.9 g/dL (ref 30.0–36.0)
MCV: 106.3 fL — ABNORMAL HIGH (ref 80.0–100.0)
Monocytes Absolute: 0.3 10*3/uL (ref 0.1–1.0)
Monocytes Relative: 14 %
Neutro Abs: 0.8 10*3/uL — ABNORMAL LOW (ref 1.7–7.7)
Neutrophils Relative %: 36 %
Platelet Count: 179 10*3/uL (ref 150–400)
RBC: 3.03 MIL/uL — ABNORMAL LOW (ref 3.87–5.11)
RDW: 13.9 % (ref 11.5–15.5)
WBC Count: 2.1 10*3/uL — ABNORMAL LOW (ref 4.0–10.5)
nRBC: 0 % (ref 0.0–0.2)

## 2021-10-25 LAB — CMP (CANCER CENTER ONLY)
ALT: 20 U/L (ref 0–44)
AST: 22 U/L (ref 15–41)
Albumin: 4.1 g/dL (ref 3.5–5.0)
Alkaline Phosphatase: 76 U/L (ref 38–126)
Anion gap: 5 (ref 5–15)
BUN: 16 mg/dL (ref 8–23)
CO2: 31 mmol/L (ref 22–32)
Calcium: 9.6 mg/dL (ref 8.9–10.3)
Chloride: 103 mmol/L (ref 98–111)
Creatinine: 0.85 mg/dL (ref 0.44–1.00)
GFR, Estimated: >60 mL/min (ref >60)
Glucose, Bld: 95 mg/dL (ref 70–99)
Potassium: 4.6 mmol/L (ref 3.5–5.1)
Sodium: 139 mmol/L (ref 135–145)
Total Bilirubin: 0.3 mg/dL (ref 0.3–1.2)
Total Protein: 7 g/dL (ref 6.5–8.1)

## 2021-10-26 ENCOUNTER — Ambulatory Visit (INDEPENDENT_AMBULATORY_CARE_PROVIDER_SITE_OTHER): Payer: Medicare HMO | Admitting: Family Medicine

## 2021-10-26 ENCOUNTER — Other Ambulatory Visit: Payer: Self-pay | Admitting: Family Medicine

## 2021-10-26 ENCOUNTER — Encounter: Payer: Self-pay | Admitting: Family Medicine

## 2021-10-26 DIAGNOSIS — E039 Hypothyroidism, unspecified: Secondary | ICD-10-CM

## 2021-10-26 DIAGNOSIS — R69 Illness, unspecified: Secondary | ICD-10-CM | POA: Diagnosis not present

## 2021-10-26 DIAGNOSIS — D539 Nutritional anemia, unspecified: Secondary | ICD-10-CM | POA: Diagnosis not present

## 2021-10-26 DIAGNOSIS — F32A Depression, unspecified: Secondary | ICD-10-CM

## 2021-10-26 DIAGNOSIS — Z720 Tobacco use: Secondary | ICD-10-CM

## 2021-10-26 DIAGNOSIS — C3412 Malignant neoplasm of upper lobe, left bronchus or lung: Secondary | ICD-10-CM

## 2021-10-26 MED ORDER — LEVOTHYROXINE SODIUM 125 MCG PO TABS: 125.0000 ug | ORAL_TABLET | Freq: Every day | ORAL | 1 refills | Status: DC

## 2021-10-26 NOTE — Assessment & Plan Note (Signed)
Lab reviewed and discussed with her. TSH low. I recommend cutting back on Synthroid to 125 mcg QD and repeat TSH in 4-8 weeks. She agreed with the plan.

## 2021-10-26 NOTE — Assessment & Plan Note (Signed)
CBC reviewed. Neutropenia- likely secondary to her chemotherapy - her oncologist manages this. Differential for her Macrocytic anemia includes vitamin B12 or folate deficiency and hypothyroidism. Less likely iron deficiency. I discussed obtaining an anemia panel with her and recommended a lab appointment for this. She stated that she would get this done with her oncologist next week. Management per anemia panel report.

## 2021-10-26 NOTE — Assessment & Plan Note (Signed)
Smoking cessation support offered. She is not ready to quit.

## 2021-10-26 NOTE — Patient Instructions (Signed)

## 2021-10-26 NOTE — Assessment & Plan Note (Signed)
Oncology report reviewed. I am unclear which treatment agent she reacted to. I advised her to discuss with her oncology team to update her allergy record. She agreed with the plan. Continue follow-up with Onc.

## 2021-10-26 NOTE — Progress Notes (Signed)
° ° °  [facility] Tele[facility]icine Visit  Patient consented to have virtual visit and was identified by name and date of birth. Method of visit: Telephone  Encounter participants: Patient: Jill [name] - located at Home Provider: Andrena Mews - located at [facility] office Others (if applicable): N/A  Chief Complaint: Follow-up on labs/Insomnia  HPI:  Fatigue: C/O fatigue and excessive sleepiness, which started about a week ago following an infusion treatment she received about one week ago. She stated that she reacted to the infusion by itching all over as well. She gets adequate sleep at night but stays sleepy and tired at night. She also mentions sometimes she sleeps a lot during the winter season. She feels her symptoms of sleepiness and fatigue have improved some. Appetite is poor.  Hypothyroidism/Lab f/u: She had blood work (CBC, Bmet, TSH) done with her oncologist and wanted me to go over her results and treatment recommendations. She is compliant with all her meds, including Synthroid 137 mcg QD. No other concerns.  Lung cancer/Smoking: She follows up with oncology regularly for systemic chemotherapy. She continues to smoke and stated that she is not ready to quit and does not wish to discuss smoking cessation at the moment.   HM:Due for mammogram, Dexa scan and COVID 19 vaccination.  ROS: per HPI  Pertinent PMHx: PMHx reviewed  Exam:  Wt 134 lb (60.8 kg)    BMI 20.99 kg/m   Respiratory: No respiratory distress and could complete full sentences over the telephone.   Assessment/Plan:  Fatigue: Likely multifactorial - anemia, lung cancer with Mets, ?? recent s/e to chemotherapy vs. depression. Consider iron therapy as well as graded exercise. MVI for appetite stimulation. She seems to be improving. Monitor closely for now.  Hypothyroidism Lab reviewed and discussed with her. TSH low. I recommend cutting back on Synthroid to 125 mcg QD and repeat  TSH in 4-8 weeks. She agreed with the plan.   Macrocytic anemia CBC reviewed. Neutropenia- likely secondary to her chemotherapy - her oncologist manages this. Differential for her Macrocytic anemia includes vitamin B12 or folate deficiency and hypothyroidism. Less likely iron deficiency. I discussed obtaining an anemia panel with her and recommended a lab appointment for this. She stated that she would get this done with her oncologist next week. Management per anemia panel report.   Tobacco abuse Smoking cessation support offered. She is not ready to quit.  Primary adenocarcinoma of left upper lobe of lung [facility]) Oncology report reviewed. I am unclear which treatment agent she reacted to. I advised her to discuss with her oncology team to update her allergy record. She agreed with the plan. Continue follow-up with Onc.  Depression Worsened by her diagnosis of Cancer. Compliant with her SSRI. She is not suicidal. I offered counseling referral. She declined at this time. F/U as needed.   HM - She will call to schedule COVID-19 vaccination or obtain at the pharmacy. She declined mammogram or Dexa scan screening.  Time spent during visit with patient: 25 minutes

## 2021-10-26 NOTE — Assessment & Plan Note (Signed)
Worsened by her diagnosis of Cancer. Compliant with her SSRI. She is not suicidal. I offered counseling referral. She declined at this time. F/U as needed.

## 2021-11-01 ENCOUNTER — Ambulatory Visit: Payer: Medicare HMO

## 2021-11-01 ENCOUNTER — Ambulatory Visit: Payer: Medicare HMO | Admitting: Internal Medicine

## 2021-11-01 ENCOUNTER — Inpatient Hospital Stay: Payer: Medicare HMO

## 2021-11-01 ENCOUNTER — Other Ambulatory Visit: Payer: Self-pay

## 2021-11-01 ENCOUNTER — Other Ambulatory Visit: Payer: Self-pay | Admitting: Internal Medicine

## 2021-11-01 ENCOUNTER — Other Ambulatory Visit: Payer: Medicare HMO

## 2021-11-01 DIAGNOSIS — C7931 Secondary malignant neoplasm of brain: Secondary | ICD-10-CM | POA: Diagnosis not present

## 2021-11-01 DIAGNOSIS — Z888 Allergy status to other drugs, medicaments and biological substances status: Secondary | ICD-10-CM | POA: Diagnosis not present

## 2021-11-01 DIAGNOSIS — Z5112 Encounter for antineoplastic immunotherapy: Secondary | ICD-10-CM | POA: Diagnosis not present

## 2021-11-01 DIAGNOSIS — Z923 Personal history of irradiation: Secondary | ICD-10-CM | POA: Diagnosis not present

## 2021-11-01 DIAGNOSIS — C3412 Malignant neoplasm of upper lobe, left bronchus or lung: Secondary | ICD-10-CM

## 2021-11-01 DIAGNOSIS — R5383 Other fatigue: Secondary | ICD-10-CM | POA: Diagnosis not present

## 2021-11-01 DIAGNOSIS — E785 Hyperlipidemia, unspecified: Secondary | ICD-10-CM | POA: Diagnosis not present

## 2021-11-01 DIAGNOSIS — Z8719 Personal history of other diseases of the digestive system: Secondary | ICD-10-CM | POA: Diagnosis not present

## 2021-11-01 DIAGNOSIS — Z5111 Encounter for antineoplastic chemotherapy: Secondary | ICD-10-CM | POA: Diagnosis not present

## 2021-11-01 DIAGNOSIS — J449 Chronic obstructive pulmonary disease, unspecified: Secondary | ICD-10-CM | POA: Diagnosis not present

## 2021-11-01 DIAGNOSIS — T451X5A Adverse effect of antineoplastic and immunosuppressive drugs, initial encounter: Secondary | ICD-10-CM | POA: Diagnosis not present

## 2021-11-01 DIAGNOSIS — Z880 Allergy status to penicillin: Secondary | ICD-10-CM | POA: Diagnosis not present

## 2021-11-01 DIAGNOSIS — I252 Old myocardial infarction: Secondary | ICD-10-CM | POA: Diagnosis not present

## 2021-11-01 DIAGNOSIS — Z882 Allergy status to sulfonamides status: Secondary | ICD-10-CM | POA: Diagnosis not present

## 2021-11-01 DIAGNOSIS — Z87440 Personal history of urinary (tract) infections: Secondary | ICD-10-CM | POA: Diagnosis not present

## 2021-11-01 DIAGNOSIS — E039 Hypothyroidism, unspecified: Secondary | ICD-10-CM | POA: Diagnosis not present

## 2021-11-01 DIAGNOSIS — D701 Agranulocytosis secondary to cancer chemotherapy: Secondary | ICD-10-CM | POA: Diagnosis not present

## 2021-11-01 DIAGNOSIS — Z79899 Other long term (current) drug therapy: Secondary | ICD-10-CM | POA: Diagnosis not present

## 2021-11-01 LAB — CBC WITH DIFFERENTIAL (CANCER CENTER ONLY)
Abs Immature Granulocytes: 0 10*3/uL (ref 0.00–0.07)
Basophils Absolute: 0 10*3/uL (ref 0.0–0.1)
Basophils Relative: 1 %
Eosinophils Absolute: 0.1 10*3/uL (ref 0.0–0.5)
Eosinophils Relative: 3 %
HCT: 32.9 % — ABNORMAL LOW (ref 36.0–46.0)
Hemoglobin: 10.8 g/dL — ABNORMAL LOW (ref 12.0–15.0)
Immature Granulocytes: 0 %
Lymphocytes Relative: 30 %
Lymphs Abs: 0.9 10*3/uL (ref 0.7–4.0)
MCH: 35.3 pg — ABNORMAL HIGH (ref 26.0–34.0)
MCHC: 32.8 g/dL (ref 30.0–36.0)
MCV: 107.5 fL — ABNORMAL HIGH (ref 80.0–100.0)
Monocytes Absolute: 0.5 10*3/uL (ref 0.1–1.0)
Monocytes Relative: 16 %
Neutro Abs: 1.5 10*3/uL — ABNORMAL LOW (ref 1.7–7.7)
Neutrophils Relative %: 50 %
Platelet Count: 225 10*3/uL (ref 150–400)
RBC: 3.06 MIL/uL — ABNORMAL LOW (ref 3.87–5.11)
RDW: 14.3 % (ref 11.5–15.5)
WBC Count: 3 10*3/uL — ABNORMAL LOW (ref 4.0–10.5)
nRBC: 0 % (ref 0.0–0.2)

## 2021-11-01 LAB — CMP (CANCER CENTER ONLY)
ALT: 19 U/L (ref 0–44)
AST: 19 U/L (ref 15–41)
Albumin: 4 g/dL (ref 3.5–5.0)
Alkaline Phosphatase: 70 U/L (ref 38–126)
Anion gap: 7 (ref 5–15)
BUN: 7 mg/dL — ABNORMAL LOW (ref 8–23)
CO2: 28 mmol/L (ref 22–32)
Calcium: 9.6 mg/dL (ref 8.9–10.3)
Chloride: 105 mmol/L (ref 98–111)
Creatinine: 1.04 mg/dL — ABNORMAL HIGH (ref 0.44–1.00)
GFR, Estimated: 57 mL/min — ABNORMAL LOW (ref >60)
Glucose, Bld: 98 mg/dL (ref 70–99)
Potassium: 4.6 mmol/L (ref 3.5–5.1)
Sodium: 140 mmol/L (ref 135–145)
Total Bilirubin: 0.3 mg/dL (ref 0.3–1.2)
Total Protein: 7.2 g/dL (ref 6.5–8.1)

## 2021-11-01 LAB — VITAMIN B12: Vitamin B-12: 854 pg/mL (ref 180–914)

## 2021-11-01 LAB — RETICULOCYTES
Immature Retic Fract: 12.3 % (ref 2.3–15.9)
RBC.: 3.02 MIL/uL — ABNORMAL LOW (ref 3.87–5.11)
Retic Count, Absolute: 42.3 10*3/uL (ref 19.0–186.0)
Retic Ct Pct: 1.4 % (ref 0.4–3.1)

## 2021-11-01 LAB — FOLATE: Folate: 73.8 ng/mL (ref >5.9)

## 2021-11-02 ENCOUNTER — Encounter: Payer: Self-pay | Admitting: Urology

## 2021-11-02 LAB — IRON AND IRON BINDING CAPACITY (CC-WL,HP ONLY)
Iron: 107 ug/dL (ref 28–170)
Saturation Ratios: 33 % — ABNORMAL HIGH (ref 10.4–31.8)
TIBC: 326 ug/dL (ref 250–450)
UIBC: 219 ug/dL (ref 148–442)

## 2021-11-02 LAB — FERRITIN: Ferritin: 121 ng/mL (ref 11–307)

## 2021-11-02 LAB — TSH: TSH: 1.203 u[IU]/mL (ref 0.308–3.960)

## 2021-11-02 NOTE — Progress Notes (Addendum)
Patient identified with 2 identifiers and reports some headaches on occasion and a rash post her last infusion treatment. No other symptoms reported at this time.   Meaningful use complete.  Postmenopausal- NO chances of pregnancy.  Patient notified of her 4:00pm-11/03/21 telephone appointment w/ Ashlyn Bruning PA-C and verbalized understanding.  Patient preferred contact # [phone]

## 2021-11-03 ENCOUNTER — Ambulatory Visit
Admission: RE | Admit: 2021-11-03 | Discharge: 2021-11-03 | Disposition: A | Discharge status: Home / Self Care | Payer: Medicare HMO | Source: Ambulatory Visit | Attending: Urology | Admitting: Urology

## 2021-11-03 DIAGNOSIS — C7931 Secondary malignant neoplasm of brain: Secondary | ICD-10-CM

## 2021-11-03 NOTE — Progress Notes (Addendum)
Radiation Oncology         [phone] ________________________________  Name: AADHYA BUSTAMANTE MRN: [id number]  Date: 11/03/2021  DOB: 20-Oct-1951  Post Treatment Note  CC: [name], MD  Curt Bears, MD  Diagnosis:   70 yo woman with cT2 cN2 cM1b, NSCLC, adenocarcinoma of the left upper lung - Stage IVA - with solitary left frontal 7 mm brain metastasis     Interval Since Last Radiation:  4 weeks   09/28/21: SRS//PTV2-3: The metastatic lesions  in the left occipital (3 mm) and left temporal (1 mm) were treated to 20 Gy in a single fraction  06/23/21: SRS// PTV1: The solitary 7 mm left frontal  brain metastasis was treated to 20 Gy in a single fraction   06/15/21 - 08/01/21: The primary tumor in the left lung and nodes were treated to 66 Gy in 33 fractions of 2 Gy (concurrent with chemotherapy).  Narrative:  I contacted the patient to conduct her routine scheduled 1 month follow up visit via telephone to spare the patient unnecessary potential exposure in the healthcare setting during the current COVID-19 pandemic.  The patient was notified in advance and gave permission to proceed with this visit format.    She tolerated radiation treatment relatively well without any ill side effects aside from moderate fatigue which she feels has continued to gradually improve.  She also continues with a persistent cough but denies hemoptysis, yellowish/green sputum, fever or chills.                           On review of systems, the patient states that she is doing well in general and is currently without complaints aside from a mild, persistent cough that is unchanged recently.  She specifically denies chest pain, increased shortness of breath or hemoptysis.  She reports a healthy appetite and is maintaining her weight.  She denies dysphagia, abdominal pain, nausea, vomiting or night sweats.  She has occasional, mild headaches but nothing that has been persistent or concerning.  She denies any  decrease in her visual or auditory acuity, dizziness/imbalance, focal weakness in the upper or lower extremities, tremor or seizure activity.  Overall, she is pleased with her progress to date.  Her 12th grandchild, 40th grand-daughter, was born 11/02/2021 and she is over-the-moon excited to meet her!  ALLERGIES:  is allergic to varenicline, ace inhibitors, prednisone, penicillins, and sulfa drugs cross reactors.  Meds: Current Outpatient Medications  Medication Sig Dispense Refill   acetaminophen (TYLENOL) 500 MG tablet Take 500 mg by mouth every 6 (six) hours as needed.     amLODipine (NORVASC) 10 MG tablet Take 1 tablet by mouth once daily 90 tablet 1   amoxicillin-clavulanate (AUGMENTIN) 875-125 MG tablet Take 1 tablet by mouth 2 (two) times daily. (Patient not taking: Reported on 10/26/2021) 12 tablet 0   aspirin EC 81 MG tablet Take 1 tablet (81 mg total) by mouth daily. 90 tablet 2   atorvastatin (LIPITOR) 40 MG tablet Take 1 tablet by mouth once daily 90 tablet 1   Cholecalciferol (VITAMIN D3) 250 MCG (10000 UT) capsule Take 10,000 mcg by mouth daily.     escitalopram (LEXAPRO) 10 MG tablet Take 1 tablet by mouth once daily 90 tablet 1   folic acid (FOLVITE) 1 MG tablet Take 1 tablet (1 mg total) by mouth daily. 30 tablet 4   levothyroxine (SYNTHROID) 125 MCG tablet Take 1 tablet (125 mcg total) by  mouth daily. 30 tablet 1   losartan (COZAAR) 100 MG tablet Take 1 tablet by mouth once daily 90 tablet 1   metoprolol succinate (TOPROL-XL) 50 MG 24 hr tablet TAKE 1 TABLET BY MOUTH ONCE DAILY WITH MEALS OR  IMMEDIATELY  FOLLOWING 90 tablet 1   Multiple Vitamins-Minerals (MULTIVITAMIN WITH MINERALS) tablet Take 1 tablet by mouth daily.     nitroGLYCERIN (NITROSTAT) 0.4 MG SL tablet Place 1 tablet (0.4 mg total) under the tongue every 5 (five) minutes as needed. For chest pain (Patient not taking: Reported on 10/26/2021) 25 tablet 6   prochlorperazine (COMPAZINE) 10 MG tablet Take 1 tablet (10  mg total) by mouth every 6 (six) hours as needed for nausea or vomiting. (Patient not taking: Reported on 10/26/2021) 30 tablet 0   Vitamin A 2400 MCG (8000 UT) TABS Take 2,400 mg by mouth daily.     vitamin B-12 (CYANOCOBALAMIN) 1000 MCG tablet Take 1,000 mcg by mouth daily. (Patient not taking: Reported on 10/26/2021)     No current facility-administered medications for this encounter.    Physical Findings:  vitals were not taken for this visit.  Pain Assessment Pain Score: 0-No pain/10 Unable to assess due to telephone follow-up visit format.  Lab Findings: Lab Results  Component Value Date   WBC 3.0 (L) 11/01/2021   HGB 10.8 (L) 11/01/2021   HCT 32.9 (L) 11/01/2021   MCV 107.5 (H) 11/01/2021   PLT 225 11/01/2021     Radiographic Findings: No results found.  Impression/Plan: 73. 70 yo woman with brain metastasis secondary to metastatic, NSCLC, adenocarcinoma of the left upper lung - Stage IVA. She appears to have recovered well from the effects of her recent radiotherapy and is currently without complaints.  She completed 6 cycles of systemic therapy with carboplatin/paclitaxel and is now getting carboplatin, Alimta and Keytruda, having completed 3 of a planned 4 cycles, to be followed by maintenance therapy with Alimta and Keytruda starting from cycle 5 as long as there is no evidence of disease progression at that time.  She will continue management of her systemic disease under the care and direction of Dr. Earlie Server.  Regarding the brain metastases, we will continue to monitor closely with serial MRI brain scans.  We discussed the plan to obtain a follow-up posttreatment MRI brain scan in approximately 2 months and we will plan to follow-up by telephone to review the results and recommendations from multidisciplinary brain conference following that scan.  Pending this scan is stable, we will then resume serial MRI brain scans every 3 months going forward and plan to connect by  telephone following each scan to review results and recommendations.  She appears to have a good understanding of these recommendations and is comfortable and in agreement with the stated plan.Nicholos Johns, PA-C

## 2021-11-04 ENCOUNTER — Other Ambulatory Visit: Payer: Self-pay

## 2021-11-04 ENCOUNTER — Ambulatory Visit (HOSPITAL_COMMUNITY)
Admission: RE | Admit: 2021-11-04 | Discharge: 2021-11-04 | Disposition: A | Discharge status: Home / Self Care | Payer: Medicare HMO | Source: Ambulatory Visit | Attending: Internal Medicine | Admitting: Internal Medicine

## 2021-11-04 ENCOUNTER — Encounter (HOSPITAL_COMMUNITY): Payer: Self-pay

## 2021-11-04 DIAGNOSIS — C3490 Malignant neoplasm of unspecified part of unspecified bronchus or lung: Secondary | ICD-10-CM | POA: Diagnosis not present

## 2021-11-04 DIAGNOSIS — J439 Emphysema, unspecified: Secondary | ICD-10-CM | POA: Diagnosis not present

## 2021-11-04 DIAGNOSIS — R911 Solitary pulmonary nodule: Secondary | ICD-10-CM | POA: Diagnosis not present

## 2021-11-04 DIAGNOSIS — I700 Atherosclerosis of aorta: Secondary | ICD-10-CM | POA: Diagnosis not present

## 2021-11-04 MED ORDER — IOHEXOL 350 MG/ML SOLN
60.0000 mL | Freq: Once | INTRAVENOUS | Status: AC | PRN
  Administered 2021-11-04: 15:00:00 60 mL via INTRAVENOUS

## 2021-11-04 MED ORDER — SODIUM CHLORIDE (PF) 0.9 % IJ SOLN
INTRAMUSCULAR | Status: AC
  Filled 2021-11-04: qty 50

## 2021-11-08 ENCOUNTER — Other Ambulatory Visit: Payer: Medicare HMO

## 2021-11-09 MED FILL — Dexamethasone Sodium Phosphate Inj 100 MG/10ML: INTRAMUSCULAR | Qty: 1 | Status: AC

## 2021-11-09 MED FILL — Fosaprepitant Dimeglumine For IV Infusion 150 MG (Base Eq): INTRAVENOUS | Qty: 5 | Status: AC

## 2021-11-10 ENCOUNTER — Inpatient Hospital Stay: Payer: Medicare HMO | Admitting: Internal Medicine

## 2021-11-10 ENCOUNTER — Ambulatory Visit: Payer: Medicare HMO | Admitting: Internal Medicine

## 2021-11-10 ENCOUNTER — Inpatient Hospital Stay: Payer: Medicare HMO

## 2021-11-15 ENCOUNTER — Inpatient Hospital Stay: Payer: Medicare HMO | Attending: Internal Medicine

## 2021-11-15 DIAGNOSIS — I700 Atherosclerosis of aorta: Secondary | ICD-10-CM | POA: Insufficient documentation

## 2021-11-15 DIAGNOSIS — C770 Secondary and unspecified malignant neoplasm of lymph nodes of head, face and neck: Secondary | ICD-10-CM | POA: Insufficient documentation

## 2021-11-15 DIAGNOSIS — R5383 Other fatigue: Secondary | ICD-10-CM | POA: Insufficient documentation

## 2021-11-15 DIAGNOSIS — E039 Hypothyroidism, unspecified: Secondary | ICD-10-CM | POA: Insufficient documentation

## 2021-11-15 DIAGNOSIS — R0602 Shortness of breath: Secondary | ICD-10-CM | POA: Insufficient documentation

## 2021-11-15 DIAGNOSIS — J449 Chronic obstructive pulmonary disease, unspecified: Secondary | ICD-10-CM | POA: Insufficient documentation

## 2021-11-15 DIAGNOSIS — Z79899 Other long term (current) drug therapy: Secondary | ICD-10-CM | POA: Insufficient documentation

## 2021-11-15 DIAGNOSIS — C3412 Malignant neoplasm of upper lobe, left bronchus or lung: Secondary | ICD-10-CM | POA: Insufficient documentation

## 2021-11-15 DIAGNOSIS — Z5111 Encounter for antineoplastic chemotherapy: Secondary | ICD-10-CM | POA: Insufficient documentation

## 2021-11-15 DIAGNOSIS — E785 Hyperlipidemia, unspecified: Secondary | ICD-10-CM | POA: Insufficient documentation

## 2021-11-15 DIAGNOSIS — J439 Emphysema, unspecified: Secondary | ICD-10-CM | POA: Insufficient documentation

## 2021-11-15 DIAGNOSIS — R519 Headache, unspecified: Secondary | ICD-10-CM | POA: Insufficient documentation

## 2021-11-15 DIAGNOSIS — C7931 Secondary malignant neoplasm of brain: Secondary | ICD-10-CM | POA: Insufficient documentation

## 2021-11-15 DIAGNOSIS — R0609 Other forms of dyspnea: Secondary | ICD-10-CM | POA: Insufficient documentation

## 2021-11-15 DIAGNOSIS — Z5112 Encounter for antineoplastic immunotherapy: Secondary | ICD-10-CM | POA: Insufficient documentation

## 2021-11-15 DIAGNOSIS — R059 Cough, unspecified: Secondary | ICD-10-CM | POA: Insufficient documentation

## 2021-11-15 DIAGNOSIS — I252 Old myocardial infarction: Secondary | ICD-10-CM | POA: Insufficient documentation

## 2021-11-16 ENCOUNTER — Telehealth: Payer: Self-pay | Admitting: Internal Medicine

## 2021-11-16 NOTE — Progress Notes (Signed)
[facility] OFFICE PROGRESS NOTE  [name], MD [place] [address]  DIAGNOSIS: Stage IV (T2 a, N2, M1b) non-small cell lung cancer, adenocarcinoma presented with left upper lobe lung mass in addition to left hilar and precarinal metastatic adenopathy and small left lower lobe pulmonary nodule in addition to solitary left frontal brain metastasis diagnosed in July 2022.   Molecular studies by [facility] showed  Positive KRAS G12C mutation PD-L1 expression was less than 1%  PRIOR THERAPY: 1) Status post SRS to the solitary brain metastasis under the care of Dr. Tammi Klippel. Completed on 06/23/21 2)  Treatment for the locally advanced disease in the chest with carboplatin for AUC of 2 and paclitaxel 45 Mg/M2.  Status post 6 cycles.  Last dose was given 07/25/2021. 3) SRS to the additional metastatic brain lesions on 09/28/22  CURRENT THERAPY: Systemic chemotherapy with carboplatin for AUC of 5, Alimta 500 Mg/M2 and Keytruda 200 Mg IV every 3 weeks.  First dose August 31, 2021. Status post 3 cycles.     INTERVAL HISTORY: Jill [name] 71 y.o. female returns to the clinic today for a follow-up visit.  The patient is feeling fair today without any concerning complaints. After her last infusion, the patient reports significant itching without rash.  She tried taking benadryl upon returning home which improved her symptoms. She denies any fever, chills, or night sweats. Her weight is stable. In general, she states she has never been a big eater and her appetite fluctuates.  She reports a decreased appetite for which she is drinking Ensure. Her breathing is "fine" today.  She reports baseline dyspnea on exertion, such as climbing the stairs.  She reports a baseline productive cough which produces clear phlegm. She is not sure if this is from her sinuses or lungs. She does not take an antihistamine. Denies any chest pain or hemoptysis.  Denies any nausea,  vomiting, or diarrhea.  Denies recent constipation. Denies any unusual headache or visual change but she states it is normal for her to get cluster headaches on and off for her whole life.  She sometimes gets cluster headaches and feels it behind her right eye.  She is following closely with radiation oncology regarding her history of metastatic disease to the brain.  She tells me she is to repeat brain MRI coming up in February 2022.  She recently had a restaging CT scan performed.  She is here today for evaluation to review her scan before starting cycle #4.  MEDICAL HISTORY: Past Medical History:  Diagnosis Date   Anxiety    ON PAXIL, XANAX   AVM (arteriovenous malformation) brain    s/p stent/coil   Blood transfusion    x 2   Cancer (HCC)    Left lung   COPD (chronic obstructive pulmonary disease) (HCC)    no inhaler, no oxygen   Coronary artery disease    Prior inferior MI with stent to RCA, s/p CABG in 2008   Depression    Dizziness    Dyspnea    with exertion, no oxygen   Fatigue    Foot injury 06/01/2017   right - RESOLVED, no longer an issue per patient 05/18/21   GERD (gastroesophageal reflux disease)    Headache(784.0)    UNRUPTURED CEREBRAL ANEURYSM   Hyperlipidemia    Hypertension    Hypothyroidism    Infected cyst of skin 09/18/2013   Left knee pain 06/05/2012   Lung mass  Left lung   Myocardial infarction [facility])    Normal nuclear stress test Ju;y 2012   No ischemia. EF 70%; fixed defect involving septum, inferoseptal and inferior wall   Pain, dental 02/06/2017   Poor dental hygiene    Retroperitoneal bleeding    Following cardiac cath   Tobacco abuse    Urine discoloration 09/18/2013   UTI (lower urinary tract infection) 10/16/2013    ALLERGIES:  is allergic to varenicline, ace inhibitors, prednisone, penicillins, and sulfa drugs cross reactors.  MEDICATIONS:  Current Outpatient Medications  Medication Sig Dispense Refill   acetaminophen (TYLENOL) 500  MG tablet Take 500 mg by mouth every 6 (six) hours as needed.     amLODipine (NORVASC) 10 MG tablet Take 1 tablet by mouth once daily 90 tablet 1   aspirin EC 81 MG tablet Take 1 tablet (81 mg total) by mouth daily. 90 tablet 2   atorvastatin (LIPITOR) 40 MG tablet Take 1 tablet by mouth once daily 90 tablet 1   Cholecalciferol (VITAMIN D3) 250 MCG (10000 UT) capsule Take 10,000 mcg by mouth daily.     escitalopram (LEXAPRO) 10 MG tablet Take 1 tablet by mouth once daily 90 tablet 1   folic acid (FOLVITE) 1 MG tablet Take 1 tablet (1 mg total) by mouth daily. 30 tablet 4   levothyroxine (SYNTHROID) 125 MCG tablet Take 1 tablet (125 mcg total) by mouth daily. 30 tablet 1   losartan (COZAAR) 100 MG tablet Take 1 tablet by mouth once daily 90 tablet 1   metoprolol succinate (TOPROL-XL) 50 MG 24 hr tablet TAKE 1 TABLET BY MOUTH ONCE DAILY WITH MEALS OR  IMMEDIATELY  FOLLOWING 90 tablet 1   Multiple Vitamins-Minerals (MULTIVITAMIN WITH MINERALS) tablet Take 1 tablet by mouth daily.     Vitamin A 2400 MCG (8000 UT) TABS Take 2,400 mg by mouth daily.     amoxicillin-clavulanate (AUGMENTIN) 875-125 MG tablet Take 1 tablet by mouth 2 (two) times daily. (Patient not taking: Reported on 10/26/2021) 12 tablet 0   nitroGLYCERIN (NITROSTAT) 0.4 MG SL tablet Place 1 tablet (0.4 mg total) under the tongue every 5 (five) minutes as needed. For chest pain (Patient not taking: Reported on 10/26/2021) 25 tablet 6   prochlorperazine (COMPAZINE) 10 MG tablet Take 1 tablet (10 mg total) by mouth every 6 (six) hours as needed for nausea or vomiting. (Patient not taking: Reported on 10/26/2021) 30 tablet 0   vitamin B-12 (CYANOCOBALAMIN) 1000 MCG tablet Take 1,000 mcg by mouth daily. (Patient not taking: Reported on 10/26/2021)     No current facility-administered medications for this visit.    SURGICAL HISTORY:  Past Surgical History:  Procedure Laterality Date   CARDIAC CATHETERIZATION  01/02/2007   IT REVEALS MILD  INFERIOR WALL HYPOKINESIS. THE EJECTION FRACTION IS AROUND 50%   COLONOSCOPY     CORONARY ARTERY BYPASS GRAFT  11/06/2006   LIMA to LAD, SVG to DX, SVG to LCX & SVG to OM 1 & 2, and SVG to PD   CORONARY STENT PLACEMENT     Remote past stent to RCA   EYE SURGERY Right    cataracts removed   HEMORRHOID SURGERY  11/07/1987   UPPER GI ENDOSCOPY     VENTRICULOSTOMY  10/06/2011   Procedure: VENTRICULOSTOMY;  Surgeon: Winfield Cunas;  Location: [facility];  Service: Neurosurgery;  Laterality: Right;  Insertion of Ventriculostomy Catheter   VIDEO BRONCHOSCOPY WITH ENDOBRONCHIAL NAVIGATION Left 05/20/2021   Procedure: VIDEO BRONCHOSCOPY WITH ENDOBRONCHIAL  NAVIGATION;  Surgeon: Garner Nash, DO;  Location: [facility];  Service: Pulmonary;  Laterality: Left;   VIDEO BRONCHOSCOPY WITH ENDOBRONCHIAL ULTRASOUND Bilateral 05/20/2021   Procedure: VIDEO BRONCHOSCOPY WITH ENDOBRONCHIAL ULTRASOUND;  Surgeon: Garner Nash, DO;  Location: [facility];  Service: Pulmonary;  Laterality: Bilateral;   WISDOM TOOTH EXTRACTION      REVIEW OF SYSTEMS:   Constitutional: Positive for fatigue. Negative for chills and fever. HENT: Negative for mouth sores, nosebleeds, sore throat and trouble swallowing.  Positive for gum lesion and poor dentition.  Eyes: Negative for eye problems and icterus.  Respiratory: Positive for baseline productive cough.  Positive for occasional shortness of breath with exertion.  Negative for hemoptysis and wheezing.   Cardiovascular: Negative for chest pain and leg swelling.  Gastrointestinal:  Negative for abdominal pain, diarrhea, constipation, nausea and vomiting.  Genitourinary: Negative for bladder incontinence, difficulty urinating, dysuria, frequency and hematuria.   Musculoskeletal: Negative for back pain, gait problem, neck pain and neck stiffness.  Skin: Negative for itching and rash.  Neurological: Positive for occasional headaches behind right eye.  Negative for dizziness, extremity  weakness, gait problem, light-headedness and seizures.  Hematological: Negative for adenopathy. Does not bruise/bleed easily.  Psychiatric/Behavioral: Negative for confusion, depression and sleep disturbance. The patient is not nervous/anxious.    PHYSICAL EXAMINATION:  Blood pressure 123/69, pulse 68, temperature (!) 97.5 F (36.4 C), temperature source Temporal, resp. rate 16, height '5\' 7"'  (1.702 m), weight 134 lb 14.4 oz (61.2 kg), SpO2 96 %.  ECOG PERFORMANCE STATUS: 1  Physical Exam  Constitutional: Oriented to person, place, and time and well-developed, well-nourished, and in no distress.   HENT:  Head: Normocephalic and atraumatic.  Mouth/Throat: Oropharynx is clear and moist. No oropharyngeal exudate. Poor dentition with multiple missing teeth and dental caries. Has a small lesion on her right upper gum line.  Eyes: Conjunctivae are normal. Right eye exhibits no discharge. Left eye exhibits no discharge. No scleral icterus.  Neck: Normal range of motion. Neck supple.  Cardiovascular: Normal rate, regular rhythm, normal heart sounds and intact distal pulses.   Pulmonary/Chest: Effort normal.  Decreased breath sounds in all lung fields.  No respiratory distress. No wheezes. No rales.  Abdominal: Soft. Bowel sounds are normal. Exhibits no distension and no mass. There is no tenderness.  Musculoskeletal: Normal range of motion. Exhibits no edema.  Lymphadenopathy:    No cervical adenopathy.  Neurological: Alert and oriented to person, place, and time. Exhibits muscle wasting. tone. Gait normal. Coordination normal.  Skin: Skin is warm and dry. No rash noted. Not diaphoretic. No erythema. No pallor.  Psychiatric: Mood, memory and judgment normal.  Vitals reviewed.  LABORATORY DATA: Lab Results  Component Value Date   WBC 4.4 11/17/2021   HGB 11.8 (L) 11/17/2021   HCT 36.0 11/17/2021   MCV 108.4 (H) 11/17/2021   PLT 197 11/17/2021      Chemistry      Component Value  Date/Time   NA 141 11/17/2021 0926   NA 141 04/12/2021 1356   K 4.4 11/17/2021 0926   CL 105 11/17/2021 0926   CO2 32 11/17/2021 0926   BUN 11 11/17/2021 0926   BUN 13 04/12/2021 1356   CREATININE 0.99 11/17/2021 0926   CREATININE 0.99 08/29/2016 1130      Component Value Date/Time   CALCIUM 9.7 11/17/2021 0926   ALKPHOS 70 11/17/2021 0926   AST 20 11/17/2021 0926   ALT 15 11/17/2021 0926   BILITOT 0.3 11/17/2021  0926       RADIOGRAPHIC STUDIES:  CT Chest W Contrast  Result Date: 11/08/2021 CLINICAL DATA:  Primary Cancer Type: Lung Imaging Indication: Assess response to therapy Interval therapy since last imaging? Yes Initial Cancer Diagnosis Date: 05/20/2021; Established by: Biopsy-proven Detailed Pathology: Stage IV non-small cell lung cancer, adenocarcinoma. Primary Tumor location: Left upper lobe. Solitary left frontal brain metastasis. Surgeries: CABG 2008.  Coronary stent. Chemotherapy: Yes; Ongoing? Yes; Most recent administration: 09/20/2021 Immunotherapy?  Yes; Type: Keytruda; Ongoing? Yes Radiation therapy? Yes Date Range: 06/15/2021 - 08/01/2021; Target: Left lung Date Range: 06/23/2021; Target: Left frontal brain metastasis EXAM: CT CHEST WITH CONTRAST TECHNIQUE: Multidetector CT imaging of the chest was performed during intravenous contrast administration. CONTRAST:  19m OMNIPAQUE IOHEXOL 350 MG/ML SOLN COMPARISON:  Most recent CT chest 08/22/2021.  05/31/2021 PET-CT. FINDINGS: Cardiovascular: Previous median sternotomy and CABG procedure. Normal heart size. No pericardial effusion. Aortic atherosclerosis. Mediastinum/Nodes: Normal appearance of the thyroid gland. The trachea appears patent and is midline. Unremarkable appearance of the esophagus. No enlarged axillary or supraclavicular lymph nodes. Index left hilar lymph node measures 0.6 cm, image 61/2. Formally 1 cm. Index right paratracheal lymph node measures 0.7 cm, image 60/2. Previously 1 cm. Index low right paratracheal  lymph node measures 0.8 cm, image 65/2. Formally 1.1 cm. Lungs/Pleura: Moderate to advanced changes of centrilobular and paraseptal emphysema. Cavitary nodule with spiculated margins in the posterior left upper lobe is again noted. This measures 2.5 x 2.0 by 1.5 cm (volume = 3.9 cm^3), image 57/6 and image 52/5. Formally this measured 3.1 x 2.6 by 2.1 cm (volume = 8.9 cm^3). Tiny nodule within the medial left upper lobe measures 3 mm, image 42/5. Previously 7 mm. Perifissural nodule along the oblique fissure of the left lung measures 5 mm and is stable, image 70/5. No new sites of disease. Upper Abdomen: No acute findings within the imaged portions of the upper abdomen. Left kidney cyst is again noted measuring 2.6 cm. Similar appearance of cystic lesion along the course of the common bile duct which measures 2.4 x 2.4 cm, image 159/2. This was not FDG avid on the PET scan from 05/31/2021 and is favored to represent a benign indolent process. This may represent a choledochal cyst or cyst arising off the head of pancreas. Aortic atherosclerosis. Musculoskeletal: No chest wall abnormality. No acute or significant osseous findings. IMPRESSION: 1. Interval decrease in size of cavitary nodule within the posterior left upper lobe. This has an approximate volume of 3.9 cc on today's study compared with 8.9 cc previously. 2. Decrease in size of mediastinal and left hilar lymph nodes. 3. Stable cystic lesion along the course of the common bile duct. This was not FDG avid on the PET scan from 05/31/2021 and is favored to represent a benign indolent process. Consider more definitive characterization with pancreas protocol MRI without and with contrast material. 4. Aortic Atherosclerosis (ICD10-I70.0) and Emphysema (ICD10-J43.9). Electronically Signed   By: TKerby MoorsM.D.   On: 11/08/2021 11:05     ASSESSMENT/PLAN:  This is a very pleasant 71year old Caucasian female diagnosed with stage IV (T2 a, N2, M1 B) non-small  cell lung cancer, adenocarcinoma.  She presented with a left upper lobe lung mass in addition to left hilar and precarinal metastatic adenopathy as well as a small left lower lobe pulmonary nodule and a solitary left frontal brain metastasis.  This was diagnosed in July 2022.  Her molecular studies show that she is positive for K-ras G12C mutation  and her PD-L1 expression is negative.  She completed SRS to the solitary brain metastasis under the care of Dr. Tammi Klippel which was completed on 06/23/2021.  She had a repeat brain MRI performed on 09/15/2021 which shows a new 1 mm and 3 mm metastatic brain lesions.  She received SRS on 09/28/2021.  She completed concurrent chemoradiation for the locally advanced disease in the chest with carboplatin for an AUC of 2 and paclitaxel 45 mg per metered squared.  She is status post 6 cycles.  She had been tolerating treatment well without any concerning adverse side effects.    The patient is currently undergoing systemic chemotherapy with carboplatin AUC 5, Alimta 500 mg per metered squared, Keytruda 20 mg IV every 3 weeks.  She is status post 3 cycles.    She had some itching following her last cycle of treatment without rash.  This improves with Benadryl.  Dr. Julien Nordmann recommends giving her premedications as ordered but we can always address in the future if she has recurrent symptoms and give her additional Benadryl if needed.  The patient recently had a restaging CT scan performed.  Dr. Julien Nordmann personally and independently reviewed the scan discussed the results with the patient today.  The scan showed no evidence of disease progression.  She had some decrease in the size of the lung mass and adenopathy.  Dr. Julien Nordmann recommend that she proceed with cycle number 4 tomorrow as scheduled. We will see her back for follow-up visit in 3 weeks for evaluation before starting cycle #5.  The patient was advised to call immediately if she has any concerning symptoms in the  interval. The patient voices understanding of current disease status and treatment options and is in agreement with the current care plan. All questions were answered. The patient knows to call the clinic with any problems, questions or concerns. We can certainly see the patient much sooner if necessary  No orders of the defined types were placed in this encounter.   Livia Tarr L Latasia Silberstein, PA-C 11/17/21  ADDENDUM: Hematology/Oncology Attending: I had a face-to-face encounter with the patient today.  I reviewed her record, lab, scan and recommended her care plan.  This is a very pleasant 71 years old white female with a stage IV (T2 a, N2, M1 B) non-small cell lung cancer, adenocarcinoma presented with left upper lobe lung mass in addition to left hilar and precarinal metastatic adenopathy as well as a small left lower lobe pulmonary nodule and solitary left frontal brain metastasis diagnosed in July 2022.  The patient has positive KRAS G12C mutation and negative PD-L1 expression. She is currently undergoing systemic chemotherapy with carboplatin for AUC of 5, Alimta 500 Mg/M2 and Keytruda 200 Mg IV every 3 weeks status post 3 cycles.  The patient has been tolerating her treatment well with no concerning complaints except for mild rash that improved with Benadryl. She had repeat CT scan of the chest performed recently.  I personally and independently reviewed the scan images and discussed the results with the patient today. Her scan showed improvement of her disease with decrease in the size of the lung mass and adenopathy. I recommended for the patient to continue her current treatment with the same regimen and she will proceed with cycle #4 today. She will come back for follow-up visit in 3 weeks for evaluation before starting cycle #5. The patient was advised to call immediately if she has any other concerning symptoms in the interval.  Disclaimer: This note was dictated  with voice  recognition software. Similar sounding words can inadvertently be transcribed and may be missed upon review. Eilleen Kempf, MD 11/17/21

## 2021-11-16 NOTE — Telephone Encounter (Signed)
Scheduled appointment per 01/05 inbasket message. Left message with details of appointments.

## 2021-11-17 ENCOUNTER — Other Ambulatory Visit: Payer: Self-pay

## 2021-11-17 ENCOUNTER — Inpatient Hospital Stay: Payer: Medicare HMO

## 2021-11-17 ENCOUNTER — Encounter: Payer: Self-pay | Admitting: Physician Assistant

## 2021-11-17 ENCOUNTER — Inpatient Hospital Stay (HOSPITAL_BASED_OUTPATIENT_CLINIC_OR_DEPARTMENT_OTHER): Payer: Medicare HMO | Admitting: Physician Assistant

## 2021-11-17 VITALS — BP 123/69 | HR 68 | Temp 97.5°F | Resp 16 | Ht 67.0 in | Wt 134.9 lb

## 2021-11-17 DIAGNOSIS — C3412 Malignant neoplasm of upper lobe, left bronchus or lung: Secondary | ICD-10-CM

## 2021-11-17 DIAGNOSIS — I700 Atherosclerosis of aorta: Secondary | ICD-10-CM | POA: Diagnosis not present

## 2021-11-17 DIAGNOSIS — R5383 Other fatigue: Secondary | ICD-10-CM | POA: Diagnosis not present

## 2021-11-17 DIAGNOSIS — R059 Cough, unspecified: Secondary | ICD-10-CM | POA: Diagnosis not present

## 2021-11-17 DIAGNOSIS — Z5111 Encounter for antineoplastic chemotherapy: Secondary | ICD-10-CM

## 2021-11-17 DIAGNOSIS — R0609 Other forms of dyspnea: Secondary | ICD-10-CM | POA: Diagnosis not present

## 2021-11-17 DIAGNOSIS — Z5112 Encounter for antineoplastic immunotherapy: Secondary | ICD-10-CM | POA: Diagnosis not present

## 2021-11-17 DIAGNOSIS — C7931 Secondary malignant neoplasm of brain: Secondary | ICD-10-CM | POA: Diagnosis not present

## 2021-11-17 DIAGNOSIS — E039 Hypothyroidism, unspecified: Secondary | ICD-10-CM | POA: Diagnosis not present

## 2021-11-17 DIAGNOSIS — J449 Chronic obstructive pulmonary disease, unspecified: Secondary | ICD-10-CM | POA: Diagnosis not present

## 2021-11-17 DIAGNOSIS — E785 Hyperlipidemia, unspecified: Secondary | ICD-10-CM | POA: Diagnosis not present

## 2021-11-17 DIAGNOSIS — J439 Emphysema, unspecified: Secondary | ICD-10-CM | POA: Diagnosis not present

## 2021-11-17 DIAGNOSIS — R0602 Shortness of breath: Secondary | ICD-10-CM | POA: Diagnosis not present

## 2021-11-17 DIAGNOSIS — Z79899 Other long term (current) drug therapy: Secondary | ICD-10-CM | POA: Diagnosis not present

## 2021-11-17 DIAGNOSIS — I252 Old myocardial infarction: Secondary | ICD-10-CM | POA: Diagnosis not present

## 2021-11-17 DIAGNOSIS — C770 Secondary and unspecified malignant neoplasm of lymph nodes of head, face and neck: Secondary | ICD-10-CM | POA: Diagnosis not present

## 2021-11-17 DIAGNOSIS — R519 Headache, unspecified: Secondary | ICD-10-CM | POA: Diagnosis not present

## 2021-11-17 LAB — CMP (CANCER CENTER ONLY)
ALT: 15 U/L (ref 0–44)
AST: 20 U/L (ref 15–41)
Albumin: 4 g/dL (ref 3.5–5.0)
Alkaline Phosphatase: 70 U/L (ref 38–126)
Anion gap: 4 — ABNORMAL LOW (ref 5–15)
BUN: 11 mg/dL (ref 8–23)
CO2: 32 mmol/L (ref 22–32)
Calcium: 9.7 mg/dL (ref 8.9–10.3)
Chloride: 105 mmol/L (ref 98–111)
Creatinine: 0.99 mg/dL (ref 0.44–1.00)
GFR, Estimated: >60 mL/min (ref >60)
Glucose, Bld: 101 mg/dL — ABNORMAL HIGH (ref 70–99)
Potassium: 4.4 mmol/L (ref 3.5–5.1)
Sodium: 141 mmol/L (ref 135–145)
Total Bilirubin: 0.3 mg/dL (ref 0.3–1.2)
Total Protein: 7.1 g/dL (ref 6.5–8.1)

## 2021-11-17 LAB — CBC WITH DIFFERENTIAL (CANCER CENTER ONLY)
Abs Immature Granulocytes: 0.01 10*3/uL (ref 0.00–0.07)
Basophils Absolute: 0.1 10*3/uL (ref 0.0–0.1)
Basophils Relative: 1 %
Eosinophils Absolute: 0.1 10*3/uL (ref 0.0–0.5)
Eosinophils Relative: 3 %
HCT: 36 % (ref 36.0–46.0)
Hemoglobin: 11.8 g/dL — ABNORMAL LOW (ref 12.0–15.0)
Immature Granulocytes: 0 %
Lymphocytes Relative: 27 %
Lymphs Abs: 1.2 10*3/uL (ref 0.7–4.0)
MCH: 35.5 pg — ABNORMAL HIGH (ref 26.0–34.0)
MCHC: 32.8 g/dL (ref 30.0–36.0)
MCV: 108.4 fL — ABNORMAL HIGH (ref 80.0–100.0)
Monocytes Absolute: 0.5 10*3/uL (ref 0.1–1.0)
Monocytes Relative: 11 %
Neutro Abs: 2.5 10*3/uL (ref 1.7–7.7)
Neutrophils Relative %: 58 %
Platelet Count: 197 10*3/uL (ref 150–400)
RBC: 3.32 MIL/uL — ABNORMAL LOW (ref 3.87–5.11)
RDW: 14.7 % (ref 11.5–15.5)
WBC Count: 4.4 10*3/uL (ref 4.0–10.5)
nRBC: 0 % (ref 0.0–0.2)

## 2021-11-17 LAB — TSH: TSH: 1.635 u[IU]/mL (ref 0.308–3.960)

## 2021-11-18 ENCOUNTER — Other Ambulatory Visit: Payer: Self-pay

## 2021-11-18 ENCOUNTER — Inpatient Hospital Stay: Payer: Medicare HMO

## 2021-11-18 VITALS — BP 130/67 | HR 71 | Temp 97.9°F | Resp 17

## 2021-11-18 DIAGNOSIS — Z5111 Encounter for antineoplastic chemotherapy: Secondary | ICD-10-CM | POA: Diagnosis not present

## 2021-11-18 DIAGNOSIS — I252 Old myocardial infarction: Secondary | ICD-10-CM | POA: Diagnosis not present

## 2021-11-18 DIAGNOSIS — Z5112 Encounter for antineoplastic immunotherapy: Secondary | ICD-10-CM | POA: Diagnosis not present

## 2021-11-18 DIAGNOSIS — C3412 Malignant neoplasm of upper lobe, left bronchus or lung: Secondary | ICD-10-CM | POA: Diagnosis not present

## 2021-11-18 DIAGNOSIS — C7931 Secondary malignant neoplasm of brain: Secondary | ICD-10-CM | POA: Diagnosis not present

## 2021-11-18 DIAGNOSIS — J449 Chronic obstructive pulmonary disease, unspecified: Secondary | ICD-10-CM | POA: Diagnosis not present

## 2021-11-18 DIAGNOSIS — E785 Hyperlipidemia, unspecified: Secondary | ICD-10-CM | POA: Diagnosis not present

## 2021-11-18 DIAGNOSIS — C770 Secondary and unspecified malignant neoplasm of lymph nodes of head, face and neck: Secondary | ICD-10-CM | POA: Diagnosis not present

## 2021-11-18 DIAGNOSIS — E039 Hypothyroidism, unspecified: Secondary | ICD-10-CM | POA: Diagnosis not present

## 2021-11-18 DIAGNOSIS — R0609 Other forms of dyspnea: Secondary | ICD-10-CM | POA: Diagnosis not present

## 2021-11-18 MED ORDER — DIPHENHYDRAMINE HCL 50 MG/ML IJ SOLN
25.0000 mg | Freq: Once | INTRAMUSCULAR | Status: AC
  Administered 2021-11-18: 25 mg via INTRAVENOUS
  Filled 2021-11-18: qty 1

## 2021-11-18 MED ORDER — SODIUM CHLORIDE 0.9 % IV SOLN: Freq: Once | INTRAVENOUS | Status: AC

## 2021-11-18 MED ORDER — SODIUM CHLORIDE 0.9 % IV SOLN
369.0000 mg | Freq: Once | INTRAVENOUS | Status: AC
  Administered 2021-11-18: 370 mg via INTRAVENOUS
  Filled 2021-11-18: qty 37

## 2021-11-18 MED ORDER — SODIUM CHLORIDE 0.9 % IV SOLN
150.0000 mg | Freq: Once | INTRAVENOUS | Status: AC
  Administered 2021-11-18: 150 mg via INTRAVENOUS
  Filled 2021-11-18: qty 150

## 2021-11-18 MED ORDER — FAMOTIDINE 20 MG IN NS 100 ML IVPB
20.0000 mg | Freq: Once | INTRAVENOUS | Status: AC
  Administered 2021-11-18: 20 mg via INTRAVENOUS
  Filled 2021-11-18: qty 100

## 2021-11-18 MED ORDER — SODIUM CHLORIDE 0.9 % IV SOLN
200.0000 mg | Freq: Once | INTRAVENOUS | Status: AC
  Administered 2021-11-18: 200 mg via INTRAVENOUS
  Filled 2021-11-18: qty 8

## 2021-11-18 MED ORDER — SODIUM CHLORIDE 0.9 % IV SOLN
500.0000 mg/m2 | Freq: Once | INTRAVENOUS | Status: AC
  Administered 2021-11-18: 900 mg via INTRAVENOUS
  Filled 2021-11-18: qty 20

## 2021-11-18 MED ORDER — SODIUM CHLORIDE 0.9 % IV SOLN
10.0000 mg | Freq: Once | INTRAVENOUS | Status: AC
  Administered 2021-11-18: 10 mg via INTRAVENOUS
  Filled 2021-11-18: qty 10

## 2021-11-18 MED ORDER — PALONOSETRON HCL INJECTION 0.25 MG/5ML
0.2500 mg | Freq: Once | INTRAVENOUS | Status: AC
  Administered 2021-11-18: 0.25 mg via INTRAVENOUS
  Filled 2021-11-18: qty 5

## 2021-11-18 NOTE — Patient Instructions (Signed)
[facility]  Discharge Instructions: Thank you for choosing [facility] to provide your oncology and hematology care.   If you have a lab appointment with the [facility], please go directly to the [facility] and check in at the registration area.   Wear comfortable clothing and clothing appropriate for easy access to any Portacath or PICC line.   We strive to give you quality time with your provider. You may need to reschedule your appointment if you arrive late (15 or more minutes).  Arriving late affects you and other patients whose appointments are after yours.  Also, if you miss three or more appointments without notifying the office, you may be dismissed from the clinic at the providers discretion.      For prescription refill requests, have your pharmacy contact our office and allow 72 hours for refills to be completed.    Today you received the following chemotherapy and/or immunotherapy agents : Keytruda, Alimta, Carboplatin      To help prevent nausea and vomiting after your treatment, we encourage you to take your nausea medication as directed.  BELOW ARE SYMPTOMS THAT SHOULD BE REPORTED IMMEDIATELY: *FEVER GREATER THAN 100.4 F (38 C) OR HIGHER *CHILLS OR SWEATING *NAUSEA AND VOMITING THAT IS NOT CONTROLLED WITH YOUR NAUSEA MEDICATION *UNUSUAL SHORTNESS OF BREATH *UNUSUAL BRUISING OR BLEEDING *URINARY PROBLEMS (pain or burning when urinating, or frequent urination) *BOWEL PROBLEMS (unusual diarrhea, constipation, pain near the anus) TENDERNESS IN MOUTH AND THROAT WITH OR WITHOUT PRESENCE OF ULCERS (sore throat, sores in mouth, or a toothache) UNUSUAL RASH, SWELLING OR PAIN  UNUSUAL VAGINAL DISCHARGE OR ITCHING   Items with * indicate a potential emergency and should be followed up as soon as possible or go to the Emergency Department if any problems should occur.  Please show the CHEMOTHERAPY ALERT CARD or IMMUNOTHERAPY ALERT  CARD at check-in to the Emergency Department and triage nurse.  Should you have questions after your visit or need to cancel or reschedule your appointment, please contact [facility]  Dept: [phone]  and follow the prompts.  Office hours are 8:00 a.m. to 4:30 p.m. Monday - Friday. Please note that voicemails left after 4:00 p.m. may not be returned until the following business day.  We are closed weekends and major holidays. You have access to a nurse at all times for urgent questions. Please call the main number to the clinic Dept: [phone] and follow the prompts.   For any non-urgent questions, you may also contact your provider using MyChart. We now offer e-Visits for anyone 73 and older to request care online for non-urgent symptoms. For details visit [email or web address].   Also download the MyChart app! Go to the app store, search "MyChart", open the app, select [facility], and log in with your MyChart username and password.  Due to Covid, a mask is required upon entering the hospital/clinic. If you do not have a mask, one will be given to you upon arrival. For doctor visits, patients may have 1 support person aged 73 or older with them. For treatment visits, patients cannot have anyone with them due to current Covid guidelines and our immunocompromised population.

## 2021-11-21 ENCOUNTER — Telehealth: Payer: Self-pay | Admitting: Internal Medicine

## 2021-11-21 NOTE — Telephone Encounter (Signed)
Called patient regarding upcoming appointments, patient is notified. 

## 2021-11-22 ENCOUNTER — Ambulatory Visit: Payer: Medicare HMO | Admitting: Internal Medicine

## 2021-11-22 ENCOUNTER — Other Ambulatory Visit: Payer: Medicare HMO

## 2021-11-22 ENCOUNTER — Inpatient Hospital Stay: Payer: Medicare HMO

## 2021-11-22 ENCOUNTER — Other Ambulatory Visit: Payer: Self-pay

## 2021-11-22 ENCOUNTER — Ambulatory Visit: Payer: Medicare HMO

## 2021-11-22 DIAGNOSIS — J449 Chronic obstructive pulmonary disease, unspecified: Secondary | ICD-10-CM | POA: Diagnosis not present

## 2021-11-22 DIAGNOSIS — C3412 Malignant neoplasm of upper lobe, left bronchus or lung: Secondary | ICD-10-CM | POA: Diagnosis not present

## 2021-11-22 DIAGNOSIS — Z5111 Encounter for antineoplastic chemotherapy: Secondary | ICD-10-CM | POA: Diagnosis not present

## 2021-11-22 DIAGNOSIS — C7931 Secondary malignant neoplasm of brain: Secondary | ICD-10-CM | POA: Diagnosis not present

## 2021-11-22 DIAGNOSIS — R0609 Other forms of dyspnea: Secondary | ICD-10-CM | POA: Diagnosis not present

## 2021-11-22 DIAGNOSIS — I252 Old myocardial infarction: Secondary | ICD-10-CM | POA: Diagnosis not present

## 2021-11-22 DIAGNOSIS — Z5112 Encounter for antineoplastic immunotherapy: Secondary | ICD-10-CM | POA: Diagnosis not present

## 2021-11-22 DIAGNOSIS — E039 Hypothyroidism, unspecified: Secondary | ICD-10-CM | POA: Diagnosis not present

## 2021-11-22 DIAGNOSIS — E785 Hyperlipidemia, unspecified: Secondary | ICD-10-CM | POA: Diagnosis not present

## 2021-11-22 DIAGNOSIS — C770 Secondary and unspecified malignant neoplasm of lymph nodes of head, face and neck: Secondary | ICD-10-CM | POA: Diagnosis not present

## 2021-11-22 LAB — CBC WITH DIFFERENTIAL (CANCER CENTER ONLY)
Abs Immature Granulocytes: 0.02 10*3/uL (ref 0.00–0.07)
Basophils Absolute: 0 10*3/uL (ref 0.0–0.1)
Basophils Relative: 0 %
Eosinophils Absolute: 0.2 10*3/uL (ref 0.0–0.5)
Eosinophils Relative: 3 %
HCT: 36.7 % (ref 36.0–46.0)
Hemoglobin: 12.2 g/dL (ref 12.0–15.0)
Immature Granulocytes: 0 %
Lymphocytes Relative: 15 %
Lymphs Abs: 0.8 10*3/uL (ref 0.7–4.0)
MCH: 35.8 pg — ABNORMAL HIGH (ref 26.0–34.0)
MCHC: 33.2 g/dL (ref 30.0–36.0)
MCV: 107.6 fL — ABNORMAL HIGH (ref 80.0–100.0)
Monocytes Absolute: 0.1 10*3/uL (ref 0.1–1.0)
Monocytes Relative: 1 %
Neutro Abs: 4.1 10*3/uL (ref 1.7–7.7)
Neutrophils Relative %: 81 %
Platelet Count: 231 10*3/uL (ref 150–400)
RBC: 3.41 MIL/uL — ABNORMAL LOW (ref 3.87–5.11)
RDW: 14.5 % (ref 11.5–15.5)
Smear Review: NORMAL
WBC Count: 5.2 10*3/uL (ref 4.0–10.5)
nRBC: 0 % (ref 0.0–0.2)

## 2021-11-22 LAB — CMP (CANCER CENTER ONLY)
ALT: 23 U/L (ref 0–44)
AST: 25 U/L (ref 15–41)
Albumin: 4 g/dL (ref 3.5–5.0)
Alkaline Phosphatase: 65 U/L (ref 38–126)
Anion gap: 6 (ref 5–15)
BUN: 14 mg/dL (ref 8–23)
CO2: 30 mmol/L (ref 22–32)
Calcium: 9.7 mg/dL (ref 8.9–10.3)
Chloride: 101 mmol/L (ref 98–111)
Creatinine: 0.94 mg/dL (ref 0.44–1.00)
GFR, Estimated: >60 mL/min (ref >60)
Glucose, Bld: 121 mg/dL — ABNORMAL HIGH (ref 70–99)
Potassium: 4.3 mmol/L (ref 3.5–5.1)
Sodium: 137 mmol/L (ref 135–145)
Total Bilirubin: 0.4 mg/dL (ref 0.3–1.2)
Total Protein: 7.2 g/dL (ref 6.5–8.1)

## 2021-11-23 ENCOUNTER — Other Ambulatory Visit: Payer: Self-pay | Admitting: Family Medicine

## 2021-11-24 ENCOUNTER — Other Ambulatory Visit: Payer: Self-pay

## 2021-11-24 DIAGNOSIS — C7931 Secondary malignant neoplasm of brain: Secondary | ICD-10-CM

## 2021-11-29 ENCOUNTER — Other Ambulatory Visit: Payer: Medicare HMO

## 2021-11-29 ENCOUNTER — Other Ambulatory Visit: Payer: Self-pay

## 2021-11-29 ENCOUNTER — Ambulatory Visit: Payer: Medicare HMO

## 2021-11-29 ENCOUNTER — Ambulatory Visit: Payer: Medicare HMO | Admitting: Internal Medicine

## 2021-11-29 ENCOUNTER — Inpatient Hospital Stay: Payer: Medicare HMO

## 2021-11-29 DIAGNOSIS — Z5112 Encounter for antineoplastic immunotherapy: Secondary | ICD-10-CM | POA: Diagnosis not present

## 2021-11-29 DIAGNOSIS — C3412 Malignant neoplasm of upper lobe, left bronchus or lung: Secondary | ICD-10-CM | POA: Diagnosis not present

## 2021-11-29 DIAGNOSIS — I252 Old myocardial infarction: Secondary | ICD-10-CM | POA: Diagnosis not present

## 2021-11-29 DIAGNOSIS — R0609 Other forms of dyspnea: Secondary | ICD-10-CM | POA: Diagnosis not present

## 2021-11-29 DIAGNOSIS — E785 Hyperlipidemia, unspecified: Secondary | ICD-10-CM | POA: Diagnosis not present

## 2021-11-29 DIAGNOSIS — Z5111 Encounter for antineoplastic chemotherapy: Secondary | ICD-10-CM | POA: Diagnosis not present

## 2021-11-29 DIAGNOSIS — J449 Chronic obstructive pulmonary disease, unspecified: Secondary | ICD-10-CM | POA: Diagnosis not present

## 2021-11-29 DIAGNOSIS — C7931 Secondary malignant neoplasm of brain: Secondary | ICD-10-CM | POA: Diagnosis not present

## 2021-11-29 DIAGNOSIS — E039 Hypothyroidism, unspecified: Secondary | ICD-10-CM | POA: Diagnosis not present

## 2021-11-29 DIAGNOSIS — C770 Secondary and unspecified malignant neoplasm of lymph nodes of head, face and neck: Secondary | ICD-10-CM | POA: Diagnosis not present

## 2021-11-29 LAB — CBC WITH DIFFERENTIAL (CANCER CENTER ONLY)
Abs Immature Granulocytes: 0.01 10*3/uL (ref 0.00–0.07)
Basophils Absolute: 0 10*3/uL (ref 0.0–0.1)
Basophils Relative: 1 %
Eosinophils Absolute: 0.1 10*3/uL (ref 0.0–0.5)
Eosinophils Relative: 2 %
HCT: 34 % — ABNORMAL LOW (ref 36.0–46.0)
Hemoglobin: 11.2 g/dL — ABNORMAL LOW (ref 12.0–15.0)
Immature Granulocytes: 0 %
Lymphocytes Relative: 34 %
Lymphs Abs: 1 10*3/uL (ref 0.7–4.0)
MCH: 35.3 pg — ABNORMAL HIGH (ref 26.0–34.0)
MCHC: 32.9 g/dL (ref 30.0–36.0)
MCV: 107.3 fL — ABNORMAL HIGH (ref 80.0–100.0)
Monocytes Absolute: 0.4 10*3/uL (ref 0.1–1.0)
Monocytes Relative: 15 %
Neutro Abs: 1.4 10*3/uL — ABNORMAL LOW (ref 1.7–7.7)
Neutrophils Relative %: 48 %
Platelet Count: 160 10*3/uL (ref 150–400)
RBC: 3.17 MIL/uL — ABNORMAL LOW (ref 3.87–5.11)
RDW: 14.5 % (ref 11.5–15.5)
WBC Count: 2.9 10*3/uL — ABNORMAL LOW (ref 4.0–10.5)
nRBC: 0 % (ref 0.0–0.2)

## 2021-11-29 LAB — CMP (CANCER CENTER ONLY)
ALT: 27 U/L (ref 0–44)
AST: 26 U/L (ref 15–41)
Albumin: 4 g/dL (ref 3.5–5.0)
Alkaline Phosphatase: 68 U/L (ref 38–126)
Anion gap: 9 (ref 5–15)
BUN: 9 mg/dL (ref 8–23)
CO2: 28 mmol/L (ref 22–32)
Calcium: 9.4 mg/dL (ref 8.9–10.3)
Chloride: 102 mmol/L (ref 98–111)
Creatinine: 0.91 mg/dL (ref 0.44–1.00)
GFR, Estimated: >60 mL/min (ref >60)
Glucose, Bld: 169 mg/dL — ABNORMAL HIGH (ref 70–99)
Potassium: 4 mmol/L (ref 3.5–5.1)
Sodium: 139 mmol/L (ref 135–145)
Total Bilirubin: 0.3 mg/dL (ref 0.3–1.2)
Total Protein: 7.1 g/dL (ref 6.5–8.1)

## 2021-12-06 ENCOUNTER — Other Ambulatory Visit: Payer: Medicare HMO

## 2021-12-08 ENCOUNTER — Inpatient Hospital Stay: Payer: Medicare HMO | Attending: Internal Medicine

## 2021-12-08 ENCOUNTER — Inpatient Hospital Stay (HOSPITAL_BASED_OUTPATIENT_CLINIC_OR_DEPARTMENT_OTHER): Payer: Medicare HMO | Admitting: Internal Medicine

## 2021-12-08 ENCOUNTER — Inpatient Hospital Stay: Payer: Medicare HMO

## 2021-12-08 ENCOUNTER — Other Ambulatory Visit: Payer: Self-pay

## 2021-12-08 ENCOUNTER — Encounter: Payer: Self-pay | Admitting: Internal Medicine

## 2021-12-08 ENCOUNTER — Other Ambulatory Visit: Payer: Self-pay | Admitting: Internal Medicine

## 2021-12-08 VITALS — BP 141/66 | HR 80 | Temp 96.6°F | Resp 18 | Ht 67.0 in | Wt 137.8 lb

## 2021-12-08 DIAGNOSIS — Z87440 Personal history of urinary (tract) infections: Secondary | ICD-10-CM | POA: Insufficient documentation

## 2021-12-08 DIAGNOSIS — E039 Hypothyroidism, unspecified: Secondary | ICD-10-CM | POA: Insufficient documentation

## 2021-12-08 DIAGNOSIS — Z79899 Other long term (current) drug therapy: Secondary | ICD-10-CM | POA: Diagnosis not present

## 2021-12-08 DIAGNOSIS — Z5111 Encounter for antineoplastic chemotherapy: Secondary | ICD-10-CM

## 2021-12-08 DIAGNOSIS — Z888 Allergy status to other drugs, medicaments and biological substances status: Secondary | ICD-10-CM | POA: Diagnosis not present

## 2021-12-08 DIAGNOSIS — R059 Cough, unspecified: Secondary | ICD-10-CM | POA: Insufficient documentation

## 2021-12-08 DIAGNOSIS — R61 Generalized hyperhidrosis: Secondary | ICD-10-CM | POA: Insufficient documentation

## 2021-12-08 DIAGNOSIS — I252 Old myocardial infarction: Secondary | ICD-10-CM | POA: Diagnosis not present

## 2021-12-08 DIAGNOSIS — E785 Hyperlipidemia, unspecified: Secondary | ICD-10-CM | POA: Insufficient documentation

## 2021-12-08 DIAGNOSIS — C7931 Secondary malignant neoplasm of brain: Secondary | ICD-10-CM | POA: Diagnosis not present

## 2021-12-08 DIAGNOSIS — C779 Secondary and unspecified malignant neoplasm of lymph node, unspecified: Secondary | ICD-10-CM | POA: Diagnosis not present

## 2021-12-08 DIAGNOSIS — Z5112 Encounter for antineoplastic immunotherapy: Secondary | ICD-10-CM | POA: Diagnosis not present

## 2021-12-08 DIAGNOSIS — Z923 Personal history of irradiation: Secondary | ICD-10-CM | POA: Insufficient documentation

## 2021-12-08 DIAGNOSIS — C3412 Malignant neoplasm of upper lobe, left bronchus or lung: Secondary | ICD-10-CM

## 2021-12-08 DIAGNOSIS — C7951 Secondary malignant neoplasm of bone: Secondary | ICD-10-CM | POA: Diagnosis not present

## 2021-12-08 DIAGNOSIS — J449 Chronic obstructive pulmonary disease, unspecified: Secondary | ICD-10-CM | POA: Diagnosis not present

## 2021-12-08 DIAGNOSIS — R630 Anorexia: Secondary | ICD-10-CM | POA: Diagnosis not present

## 2021-12-08 DIAGNOSIS — Z882 Allergy status to sulfonamides status: Secondary | ICD-10-CM | POA: Diagnosis not present

## 2021-12-08 DIAGNOSIS — Z880 Allergy status to penicillin: Secondary | ICD-10-CM | POA: Insufficient documentation

## 2021-12-08 DIAGNOSIS — R5383 Other fatigue: Secondary | ICD-10-CM | POA: Diagnosis not present

## 2021-12-08 DIAGNOSIS — Z8719 Personal history of other diseases of the digestive system: Secondary | ICD-10-CM | POA: Diagnosis not present

## 2021-12-08 LAB — CMP (CANCER CENTER ONLY)
ALT: 17 U/L (ref 0–44)
AST: 21 U/L (ref 15–41)
Albumin: 4 g/dL (ref 3.5–5.0)
Alkaline Phosphatase: 66 U/L (ref 38–126)
Anion gap: 6 (ref 5–15)
BUN: 12 mg/dL (ref 8–23)
CO2: 30 mmol/L (ref 22–32)
Calcium: 9.4 mg/dL (ref 8.9–10.3)
Chloride: 104 mmol/L (ref 98–111)
Creatinine: 0.94 mg/dL (ref 0.44–1.00)
GFR, Estimated: >60 mL/min (ref >60)
Glucose, Bld: 133 mg/dL — ABNORMAL HIGH (ref 70–99)
Potassium: 3.8 mmol/L (ref 3.5–5.1)
Sodium: 140 mmol/L (ref 135–145)
Total Bilirubin: 0.3 mg/dL (ref 0.3–1.2)
Total Protein: 6.9 g/dL (ref 6.5–8.1)

## 2021-12-08 LAB — CBC WITH DIFFERENTIAL (CANCER CENTER ONLY)
Abs Immature Granulocytes: 0.01 10*3/uL (ref 0.00–0.07)
Basophils Absolute: 0 10*3/uL (ref 0.0–0.1)
Basophils Relative: 1 %
Eosinophils Absolute: 0.1 10*3/uL (ref 0.0–0.5)
Eosinophils Relative: 4 %
HCT: 32.8 % — ABNORMAL LOW (ref 36.0–46.0)
Hemoglobin: 11.2 g/dL — ABNORMAL LOW (ref 12.0–15.0)
Immature Granulocytes: 0 %
Lymphocytes Relative: 32 %
Lymphs Abs: 1.1 10*3/uL (ref 0.7–4.0)
MCH: 35.7 pg — ABNORMAL HIGH (ref 26.0–34.0)
MCHC: 34.1 g/dL (ref 30.0–36.0)
MCV: 104.5 fL — ABNORMAL HIGH (ref 80.0–100.0)
Monocytes Absolute: 0.5 10*3/uL (ref 0.1–1.0)
Monocytes Relative: 14 %
Neutro Abs: 1.6 10*3/uL — ABNORMAL LOW (ref 1.7–7.7)
Neutrophils Relative %: 49 %
Platelet Count: 176 10*3/uL (ref 150–400)
RBC: 3.14 MIL/uL — ABNORMAL LOW (ref 3.87–5.11)
RDW: 14.6 % (ref 11.5–15.5)
WBC Count: 3.3 10*3/uL — ABNORMAL LOW (ref 4.0–10.5)
nRBC: 0 % (ref 0.0–0.2)

## 2021-12-08 LAB — TSH: TSH: 10.836 u[IU]/mL — ABNORMAL HIGH (ref 0.308–3.960)

## 2021-12-08 MED ORDER — SODIUM CHLORIDE 0.9 % IV SOLN
200.0000 mg | Freq: Once | INTRAVENOUS | Status: AC
  Administered 2021-12-08: 200 mg via INTRAVENOUS
  Filled 2021-12-08: qty 8

## 2021-12-08 MED ORDER — FAMOTIDINE IN NACL 20-0.9 MG/50ML-% IV SOLN
20.0000 mg | Freq: Once | INTRAVENOUS | Status: AC
  Administered 2021-12-08: 20 mg via INTRAVENOUS
  Filled 2021-12-08: qty 50

## 2021-12-08 MED ORDER — DIPHENHYDRAMINE HCL 50 MG/ML IJ SOLN
25.0000 mg | Freq: Once | INTRAMUSCULAR | Status: AC
  Administered 2021-12-08: 25 mg via INTRAVENOUS
  Filled 2021-12-08: qty 1

## 2021-12-08 MED ORDER — SODIUM CHLORIDE 0.9 % IV SOLN
500.0000 mg/m2 | Freq: Once | INTRAVENOUS | Status: AC
  Administered 2021-12-08: 900 mg via INTRAVENOUS
  Filled 2021-12-08: qty 20

## 2021-12-08 MED ORDER — FAMOTIDINE 20 MG IN NS 100 ML IVPB: 20.0000 mg | Freq: Once | INTRAVENOUS | Status: DC

## 2021-12-08 MED ORDER — PROCHLORPERAZINE MALEATE 10 MG PO TABS
10.0000 mg | ORAL_TABLET | Freq: Once | ORAL | Status: AC
  Administered 2021-12-08: 10 mg via ORAL
  Filled 2021-12-08: qty 1

## 2021-12-08 MED ORDER — SODIUM CHLORIDE 0.9 % IV SOLN: Freq: Once | INTRAVENOUS | Status: AC

## 2021-12-08 NOTE — Progress Notes (Signed)
Patient reported itching all over at the end of Cycle 3. Will continue Pepcid and Ben premedications for future cycles at this time.  Raul [place], [place], BCPS, BCOP 12/08/2021 11:15 AM

## 2021-12-08 NOTE — Patient Instructions (Signed)
[facility]  Discharge Instructions: Thank you for choosing [facility] to provide your oncology and hematology care.   If you have a lab appointment with the Cancer Center, please go directly to the Cancer Center and check in at the registration area.   Wear comfortable clothing and clothing appropriate for easy access to any Portacath or PICC line.   We strive to give you quality time with your provider. You may need to reschedule your appointment if you arrive late (15 or more minutes).  Arriving late affects you and other patients whose appointments are after yours.  Also, if you miss three or more appointments without notifying the office, you may be dismissed from the clinic at the provider's discretion.      For prescription refill requests, have your pharmacy contact our office and allow 72 hours for refills to be completed.    Today you received the following chemotherapy and/or immunotherapy agents: Keytruda/Alimta.      To help prevent nausea and vomiting after your treatment, we encourage you to take your nausea medication as directed.  BELOW ARE SYMPTOMS THAT SHOULD BE REPORTED IMMEDIATELY: *FEVER GREATER THAN 100.4 F (38 C) OR HIGHER *CHILLS OR SWEATING *NAUSEA AND VOMITING THAT IS NOT CONTROLLED WITH YOUR NAUSEA MEDICATION *UNUSUAL SHORTNESS OF BREATH *UNUSUAL BRUISING OR BLEEDING *URINARY PROBLEMS (pain or burning when urinating, or frequent urination) *BOWEL PROBLEMS (unusual diarrhea, constipation, pain near the anus) TENDERNESS IN MOUTH AND THROAT WITH OR WITHOUT PRESENCE OF ULCERS (sore throat, sores in mouth, or a toothache) UNUSUAL RASH, SWELLING OR PAIN  UNUSUAL VAGINAL DISCHARGE OR ITCHING   Items with * indicate a potential emergency and should be followed up as soon as possible or go to the Emergency Department if any problems should occur.  Please show the CHEMOTHERAPY ALERT CARD or IMMUNOTHERAPY ALERT CARD at  check-in to the Emergency Department and triage nurse.  Should you have questions after your visit or need to cancel or reschedule your appointment, please contact [facility]  Dept: [phone]  and follow the prompts.  Office hours are 8:00 a.m. to 4:30 p.m. Monday - Friday. Please note that voicemails left after 4:00 p.m. may not be returned until the following business day.  We are closed weekends and major holidays. You have access to a nurse at all times for urgent questions. Please call the main number to the clinic Dept: [phone] and follow the prompts.   For any non-urgent questions, you may also contact your provider using MyChart. We now offer e-Visits for anyone 18 and older to request care online for non-urgent symptoms. For details visit [email or web address].   Also download the MyChart app! Go to the app store, search "MyChart", open the app, select , and log in with your MyChart username and password.  Due to Covid, a mask is required upon entering the hospital/clinic. If you do not have a mask, one will be given to you upon arrival. For doctor visits, patients may have 1 support person aged 18 or older with them. For treatment visits, patients cannot have anyone with them due to current Covid guidelines and our immunocompromised population.   

## 2021-12-08 NOTE — Progress Notes (Signed)
[facility] Telephone:([phone]) [phone]   Fax:([phone]) [phone]  OFFICE PROGRESS NOTE  [name], MD [place] [address]  DIAGNOSIS: Stage IV (T2 a, N2, M1b) non-small cell lung cancer, adenocarcinoma presented with left upper lobe lung mass in addition to left hilar and precarinal metastatic adenopathy and small left lower lobe pulmonary nodule in addition to solitary left frontal brain metastasis diagnosed in July 2022.  Molecular studies by [facility] showed  Positive KRAS G12C mutation PD-L1 expression was less than 1%   PRIOR THERAPY:  1) Status post SRS to the solitary brain metastasis. 20 Treatment for the locally advanced disease in the chest with carboplatin for AUC of 2 and paclitaxel 45 Mg/M2.  Status post 6 cycles.  Last dose was given 07/25/2021.  CURRENT THERAPY: Systemic chemotherapy with carboplatin for AUC of 5, Alimta 500 Mg/M2 and Keytruda 200 Mg IV every 3 weeks.  First dose August 31, 2021.  Status post 4 cycles.  Starting from cycle #5 the patient will be on maintenance treatment with Alimta and Keytruda every 3 weeks.  INTERVAL HISTORY: Jill [name] 71 y.o. female returns to the clinic today for follow-up visit.  The patient is feeling fine today with no concerning complaints except for mild fatigue and feeling sleepy a little bit today.  She denied having any current chest pain, shortness of breath, cough or hemoptysis.  She denied having any nausea, vomiting, diarrhea or constipation.  She has no headache or visual changes.  She denied having any significant weight loss or night sweats.  She is here today for evaluation before starting cycle #5 of her treatment.   MEDICAL HISTORY: Past Medical History:  Diagnosis Date   Anxiety    ON PAXIL, XANAX   AVM (arteriovenous malformation) brain    s/p stent/coil   Blood transfusion    x 2   Cancer (HCC)    Left lung   COPD (chronic obstructive pulmonary disease)  (HCC)    no inhaler, no oxygen   Coronary artery disease    Prior inferior MI with stent to RCA, s/p CABG in 2008   Depression    Dizziness    Dyspnea    with exertion, no oxygen   Fatigue    Foot injury 06/01/2017   right - RESOLVED, no longer an issue per patient 05/18/21   GERD (gastroesophageal reflux disease)    Headache(784.0)    UNRUPTURED CEREBRAL ANEURYSM   Hyperlipidemia    Hypertension    Hypothyroidism    Infected cyst of skin 09/18/2013   Left knee pain 06/05/2012   Lung mass    Left lung   Myocardial infarction ([facility])    Normal nuclear stress test Ju;y 2012   No ischemia. EF 70%; fixed defect involving septum, inferoseptal and inferior wall   Pain, dental 02/06/2017   Poor dental hygiene    Retroperitoneal bleeding    Following cardiac cath   Tobacco abuse    Urine discoloration 09/18/2013   UTI (lower urinary tract infection) 10/16/2013    ALLERGIES:  is allergic to varenicline, ace inhibitors, prednisone, penicillins, and sulfa drugs cross reactors.  MEDICATIONS:  Current Outpatient Medications  Medication Sig Dispense Refill   acetaminophen (TYLENOL) 500 MG tablet Take 500 mg by mouth every 6 (six) hours as needed.     amLODipine (NORVASC) 10 MG tablet Take 1 tablet by mouth once daily 90 tablet 1   amoxicillin-clavulanate (AUGMENTIN) 875-125 MG  tablet Take 1 tablet by mouth 2 (two) times daily. (Patient not taking: Reported on 10/26/2021) 12 tablet 0   aspirin EC 81 MG tablet Take 1 tablet (81 mg total) by mouth daily. 90 tablet 2   atorvastatin (LIPITOR) 40 MG tablet Take 1 tablet by mouth once daily 90 tablet 1   Cholecalciferol (VITAMIN D3) 250 MCG (10000 UT) capsule Take 10,000 mcg by mouth daily.     escitalopram (LEXAPRO) 10 MG tablet Take 1 tablet by mouth once daily 90 tablet 1   folic acid (FOLVITE) 1 MG tablet Take 1 tablet (1 mg total) by mouth daily. 30 tablet 4   levothyroxine (SYNTHROID) 125 MCG tablet Take 1 tablet (125 mcg total) by mouth  daily. 30 tablet 1   losartan (COZAAR) 100 MG tablet Take 1 tablet by mouth once daily 90 tablet 1   metoprolol succinate (TOPROL-XL) 50 MG 24 hr tablet TAKE 1 TABLET BY MOUTH ONCE DAILY WITH MEALS OR  IMMEDIATELY  FOLLOWING 90 tablet 1   Multiple Vitamins-Minerals (MULTIVITAMIN WITH MINERALS) tablet Take 1 tablet by mouth daily.     nitroGLYCERIN (NITROSTAT) 0.4 MG SL tablet Place 1 tablet (0.4 mg total) under the tongue every 5 (five) minutes as needed. For chest pain (Patient not taking: Reported on 10/26/2021) 25 tablet 6   prochlorperazine (COMPAZINE) 10 MG tablet Take 1 tablet (10 mg total) by mouth every 6 (six) hours as needed for nausea or vomiting. (Patient not taking: Reported on 10/26/2021) 30 tablet 0   Vitamin A 2400 MCG (8000 UT) TABS Take 2,400 mg by mouth daily.     vitamin B-12 (CYANOCOBALAMIN) 1000 MCG tablet Take 1,000 mcg by mouth daily. (Patient not taking: Reported on 10/26/2021)     No current facility-administered medications for this visit.    SURGICAL HISTORY:  Past Surgical History:  Procedure Laterality Date   CARDIAC CATHETERIZATION  01/02/2007   IT REVEALS MILD INFERIOR WALL HYPOKINESIS. THE EJECTION FRACTION IS AROUND 50%   COLONOSCOPY     CORONARY ARTERY BYPASS GRAFT  11/06/2006   LIMA to LAD, SVG to DX, SVG to LCX & SVG to OM 1 & 2, and SVG to PD   CORONARY STENT PLACEMENT     Remote past stent to RCA   EYE SURGERY Right    cataracts removed   HEMORRHOID SURGERY  11/07/1987   UPPER GI ENDOSCOPY     VENTRICULOSTOMY  10/06/2011   Procedure: VENTRICULOSTOMY;  Surgeon: Winfield Cunas;  Location: [facility];  Service: Neurosurgery;  Laterality: Right;  Insertion of Ventriculostomy Catheter   VIDEO BRONCHOSCOPY WITH ENDOBRONCHIAL NAVIGATION Left 05/20/2021   Procedure: VIDEO BRONCHOSCOPY WITH ENDOBRONCHIAL NAVIGATION;  Surgeon: Garner Nash, DO;  Location: [facility];  Service: Pulmonary;  Laterality: Left;   VIDEO BRONCHOSCOPY WITH ENDOBRONCHIAL ULTRASOUND  Bilateral 05/20/2021   Procedure: VIDEO BRONCHOSCOPY WITH ENDOBRONCHIAL ULTRASOUND;  Surgeon: Garner Nash, DO;  Location: [facility];  Service: Pulmonary;  Laterality: Bilateral;   WISDOM TOOTH EXTRACTION      REVIEW OF SYSTEMS:  A comprehensive review of systems was negative except for: Constitutional: positive for fatigue   PHYSICAL EXAMINATION: General appearance: alert, cooperative, fatigued, and no distress Head: Normocephalic, without obvious abnormality, atraumatic Neck: no adenopathy, no JVD, supple, symmetrical, trachea midline, and thyroid not enlarged, symmetric, no tenderness/mass/nodules Lymph nodes: Cervical, supraclavicular, and axillary nodes normal. Resp: clear to auscultation bilaterally Back: symmetric, no curvature. ROM normal. No CVA tenderness. Cardio: regular rate and rhythm, S1, S2 normal,  no murmur, click, rub or gallop GI: soft, non-tender; bowel sounds normal; no masses,  no organomegaly Extremities: extremities normal, atraumatic, no cyanosis or edema  ECOG PERFORMANCE STATUS: 1 - Symptomatic but completely ambulatory  Blood pressure (!) 141/66, pulse 80, temperature (!) 96.6 F (35.9 C), temperature source Tympanic, resp. rate 18, height _0  (1.702 m), weight 137 lb 12.8 oz (62.5 kg), SpO2 94 %.  LABORATORY DATA: Lab Results  Component Value Date   WBC 3.3 (L) 12/08/2021   HGB 11.2 (L) 12/08/2021   HCT 32.8 (L) 12/08/2021   MCV 104.5 (H) 12/08/2021   PLT 176 12/08/2021      Chemistry      Component Value Date/Time   NA 139 11/29/2021 1552   NA 141 04/12/2021 1356   K 4.0 11/29/2021 1552   CL 102 11/29/2021 1552   CO2 28 11/29/2021 1552   BUN 9 11/29/2021 1552   BUN 13 04/12/2021 1356   CREATININE 0.91 11/29/2021 1552   CREATININE 0.99 08/29/2016 1130      Component Value Date/Time   CALCIUM 9.4 11/29/2021 1552   ALKPHOS 68 11/29/2021 1552   AST 26 11/29/2021 1552   ALT 27 11/29/2021 1552   BILITOT 0.3 11/29/2021 1552        RADIOGRAPHIC STUDIES: No results found.  ASSESSMENT AND PLAN: This is a very pleasant 71 years old white female diagnosed with a stage IV (T2 a, N2, M1 B) non-small cell lung cancer, adenocarcinoma presented with left upper lobe lung mass in addition to left hilar and precarinal metastatic adenopathy as well as small left lower lobe pulmonary nodule and solitary left frontal brain metastasis diagnosed in July 2022.  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 The patient is status post SRS to the solitary brain metastasis. She underwent a course of concurrent chemoradiation to the locally advanced disease in the chest with carboplatin for AUC of 2 and paclitaxel 45 Mg/M2 status post 6 cycles.  The patient has been tolerating her treatment well with no concerning adverse effects. Her scan showed mild improvement of her disease with no concerning findings for progression. Technically the patient has a stage IV lung cancer with brain metastasis  The patient is currently undergoing systemic chemotherapy with carboplatin for AUC of 5, Alimta 500 Mg/M2 and Keytruda 200 Mg IV every 3 weeks status post 4 cycles.  Starting from cycle #5 the patient will be on maintenance treatment with Alimta and Keytruda every 3 weeks.   .    She tolerated the last cycle of her treatment fairly well. I recommended for the patient to proceed with cycle #5 today as planned. I will see her back for follow-up visit in 3 weeks for evaluation before starting cycle #6. The patient was advised to call immediately if she has any other concerning symptoms in the interval. The patient voices understanding of current disease status and treatment options and is in agreement with the current care plan.  All questions were answered. The patient knows to call the clinic with any problems, questions or concerns. We can certainly see the patient much sooner if necessary.  The total time  spent in the appointment was 20 minutes.  Disclaimer: This note was dictated with voice recognition software. Similar sounding words can inadvertently be transcribed and may not be corrected upon review.

## 2021-12-11 NOTE — Progress Notes (Signed)
°  Radiation Oncology         [phone] ________________________________  Name: Jill [name] MRN: [id number]  Date: 09/28/2021  DOB: 17-Feb-1951  End of Treatment Note  Diagnosis:   71 yo woman with cT2 cN2 cM1b adenocarcinoma of the left upper lung - Stage IVA with a new 3 mm left occipital and 1 mm left temporal metastases     Indication for treatment:  Palliation       Radiation treatment dates:   09/28/21  Site/dose/beams/energy:   Two brain metastases in the left occipital 3 mm and left temporal 1 mm were treated using 7 Dynamic Conformal Arcs to a prescription dose of 20 Gy.  ExacTrac registration was performed for each couch angle.  The 100% isodose line was prescribed.  6 MV X-rays were delivered in the flattening filter free beam mode.   Narrative: The patient tolerated radiation treatment relatively well.     Plan: The patient has completed radiation treatment. The patient will return to radiation oncology clinic for routine followup in one month. I advised her to call or return sooner if she has any questions or concerns related to her recovery or treatment. ________________________________  Sheral Apley. Tammi Klippel, M.D.

## 2021-12-13 ENCOUNTER — Other Ambulatory Visit: Payer: Medicare HMO

## 2021-12-13 ENCOUNTER — Ambulatory Visit: Payer: Medicare HMO | Admitting: Physician Assistant

## 2021-12-13 ENCOUNTER — Ambulatory Visit: Payer: Medicare HMO

## 2021-12-20 ENCOUNTER — Ambulatory Visit: Payer: Medicare HMO

## 2021-12-20 ENCOUNTER — Ambulatory Visit: Payer: Medicare HMO | Admitting: Physician Assistant

## 2021-12-20 ENCOUNTER — Other Ambulatory Visit: Payer: Medicare HMO

## 2021-12-24 ENCOUNTER — Other Ambulatory Visit: Payer: Self-pay | Admitting: Family Medicine

## 2021-12-26 ENCOUNTER — Telehealth: Payer: Self-pay | Admitting: Family Medicine

## 2021-12-26 ENCOUNTER — Other Ambulatory Visit: Payer: Self-pay | Admitting: Family Medicine

## 2021-12-26 DIAGNOSIS — E039 Hypothyroidism, unspecified: Secondary | ICD-10-CM

## 2021-12-26 MED ORDER — LEVOTHYROXINE SODIUM 137 MCG PO TABS: 137.0000 ug | ORAL_TABLET | Freq: Every day | ORAL | 1 refills | Status: DC

## 2021-12-26 NOTE — Telephone Encounter (Addendum)
I received a med refill request for her Synthroid - 125 mcg.  Upon lab review, her TSH checked two weeks ago was 10.8, which is a jump from 1.6 which was tested in January of 2023.  Her Oncologist reduced her Synthroid from 137 mcg to 125 mcg based on a TSH level of 0.26 in Dec.  I advised her to return back to her 137 mcg dose and recheck the lab in 4 weeks. Her thyroid disease might be challenging to manage due to cancer therapy. Hence, close monitoring is needed.  I refilled Synthroid 137 mcg. F/U lab is scheduled for March 17th. She agreed with the plan.  NB:  I asked her if she preferred her Oncologist to manage her thyroid disease to prevent medication management errors. She stated she prefers I manage it. She will let me know if anything changes. However, if a frequent check is required by oncology due to her treatment, they will need to manage her Synthroid adjustment.

## 2021-12-27 ENCOUNTER — Other Ambulatory Visit: Payer: Medicare HMO

## 2021-12-27 NOTE — Progress Notes (Signed)
[facility] OFFICE PROGRESS NOTE  [name], MD [place] [address]  DIAGNOSIS: Stage IV (T2 a, N2, M1b) non-small cell lung cancer, adenocarcinoma presented with left upper lobe lung mass in addition to left hilar and precarinal metastatic adenopathy and small left lower lobe pulmonary nodule in addition to solitary left frontal brain metastasis diagnosed in July 2022.   Molecular studies by [facility] showed  Positive KRAS G12C mutation PD-L1 expression was less than 1%  PRIOR THERAPY: 1) Status post SRS to the solitary brain metastasis under the care of Dr. Tammi Klippel. Completed on 06/23/21 2)  Treatment for the locally advanced disease in the chest with carboplatin for AUC of 2 and paclitaxel 45 Mg/M2.  Status post 6 cycles.  Last dose was given 07/25/2021. 3) SRS to the additional metastatic brain lesions on 09/28/22  CURRENT THERAPY: Systemic chemotherapy with carboplatin for AUC of 5, Alimta 500 Mg/M2 and Keytruda 200 Mg IV every 3 weeks.  First dose August 31, 2021. Status post 5 cycles. Starting from cycle #5, she will start Alimta and keytruda maintenance IV every 3 weeks.   INTERVAL HISTORY: Jill [name] 71 y.o. female returns the clinic for a follow up visit. She was last seen in the clinic 3 weeks ago. She is feeling fairly well today without any concerning complaints. She is currently undergoing chemotherapy and immunotherapy. She is tolerating treatment fairly well except for fatigue. She is scheduled to start maintenance treatment at her last appointment with alimta and [place]. She denies fevers or chills. She reports she has night sweats due to a warm sleeping environment. She reports fluctuating appetite. She stopped taking supplemental drinks but admits she may need to resume taking this. Her weight is stable. She tries to drink ensure. She mentioned a few days ago, she was doing laundry and went to rest on the couch. She states  she felt a little abnormal after lying down. She thinks she may have lost consciousness for a few seconds. She states her vision when she opened her eyes was upside down briefly. Denies leg swelling or chest pain. Denies speech changes or extremity weakness. No recurrent symptoms since this time. She has a history of metastatic disease to the brain. She is scheduled for a brain MRI tomorrow. Her case is to be discussed at the brain tumor board next week and she has a follow up with radiation oncology a few days later.   She reports some dyspnea on exertion with certain activities such as climbing the stairs. She denies significant cough, hemoptysis, or chest pain. Denies nausea, vomiting, diarrhea, or constipation. She denies rashes or skin changes. She is seeing her PCP who is monitoring her hypothyroidism. She is rechecking her labs in a few weeks.  She is here today for evaluation and repeat blood work before starting cycle #6.     MEDICAL HISTORY: Past Medical History:  Diagnosis Date   Anxiety    ON PAXIL, XANAX   AVM (arteriovenous malformation) brain    s/p stent/coil   Blood transfusion    x 2   Cancer (HCC)    Left lung   COPD (chronic obstructive pulmonary disease) (HCC)    no inhaler, no oxygen   Coronary artery disease    Prior inferior MI with stent to RCA, s/p CABG in 2008   Depression    Dizziness    Dyspnea    with exertion, no oxygen   Fatigue  Foot injury 06/01/2017   right - RESOLVED, no longer an issue per patient 05/18/21   GERD (gastroesophageal reflux disease)    Headache(784.0)    UNRUPTURED CEREBRAL ANEURYSM   Hyperlipidemia    Hypertension    Hypothyroidism    Infected cyst of skin 09/18/2013   Left knee pain 06/05/2012   Lung mass    Left lung   Myocardial infarction [facility])    Normal nuclear stress test Ju;y 2012   No ischemia. EF 70%; fixed defect involving septum, inferoseptal and inferior wall   Pain, dental 02/06/2017   Poor dental hygiene     Retroperitoneal bleeding    Following cardiac cath   Tobacco abuse    Urine discoloration 09/18/2013   UTI (lower urinary tract infection) 10/16/2013    ALLERGIES:  is allergic to varenicline, ace inhibitors, prednisone, penicillins, and sulfa drugs cross reactors.  MEDICATIONS:  Current Outpatient Medications  Medication Sig Dispense Refill   acetaminophen (TYLENOL) 500 MG tablet Take 500 mg by mouth every 6 (six) hours as needed.     amLODipine (NORVASC) 10 MG tablet Take 1 tablet by mouth once daily 90 tablet 1   amoxicillin-clavulanate (AUGMENTIN) 875-125 MG tablet Take 1 tablet by mouth 2 (two) times daily. (Patient not taking: Reported on 10/26/2021) 12 tablet 0   aspirin EC 81 MG tablet Take 1 tablet (81 mg total) by mouth daily. 90 tablet 2   atorvastatin (LIPITOR) 40 MG tablet Take 1 tablet by mouth once daily 90 tablet 1   Cholecalciferol (VITAMIN D3) 250 MCG (10000 UT) capsule Take 10,000 mcg by mouth daily.     escitalopram (LEXAPRO) 10 MG tablet Take 1 tablet by mouth once daily 90 tablet 1   folic acid (FOLVITE) 1 MG tablet Take 1 tablet (1 mg total) by mouth daily. 30 tablet 4   levothyroxine (SYNTHROID) 137 MCG tablet Take 1 tablet (137 mcg total) by mouth daily before breakfast. 30 tablet 1   losartan (COZAAR) 100 MG tablet Take 1 tablet by mouth once daily 90 tablet 1   metoprolol succinate (TOPROL-XL) 50 MG 24 hr tablet TAKE 1 TABLET BY MOUTH ONCE DAILY WITH MEALS OR  IMMEDIATELY  FOLLOWING 90 tablet 1   Multiple Vitamins-Minerals (MULTIVITAMIN WITH MINERALS) tablet Take 1 tablet by mouth daily.     nitroGLYCERIN (NITROSTAT) 0.4 MG SL tablet Place 1 tablet (0.4 mg total) under the tongue every 5 (five) minutes as needed. For chest pain (Patient not taking: Reported on 10/26/2021) 25 tablet 6   prochlorperazine (COMPAZINE) 10 MG tablet Take 1 tablet (10 mg total) by mouth every 6 (six) hours as needed for nausea or vomiting. (Patient not taking: Reported on 10/26/2021) 30  tablet 0   Vitamin A 2400 MCG (8000 UT) TABS Take 2,400 mg by mouth daily.     vitamin B-12 (CYANOCOBALAMIN) 1000 MCG tablet Take 1,000 mcg by mouth daily. (Patient not taking: Reported on 10/26/2021)     No current facility-administered medications for this visit.    SURGICAL HISTORY:  Past Surgical History:  Procedure Laterality Date   CARDIAC CATHETERIZATION  01/02/2007   IT REVEALS MILD INFERIOR WALL HYPOKINESIS. THE EJECTION FRACTION IS AROUND 50%   COLONOSCOPY     CORONARY ARTERY BYPASS GRAFT  11/06/2006   LIMA to LAD, SVG to DX, SVG to LCX & SVG to OM 1 & 2, and SVG to PD   CORONARY STENT PLACEMENT     Remote past stent to RCA   EYE SURGERY  Right    cataracts removed   HEMORRHOID SURGERY  11/07/1987   UPPER GI ENDOSCOPY     VENTRICULOSTOMY  10/06/2011   Procedure: VENTRICULOSTOMY;  Surgeon: Winfield Cunas;  Location: [facility];  Service: Neurosurgery;  Laterality: Right;  Insertion of Ventriculostomy Catheter   VIDEO BRONCHOSCOPY WITH ENDOBRONCHIAL NAVIGATION Left 05/20/2021   Procedure: VIDEO BRONCHOSCOPY WITH ENDOBRONCHIAL NAVIGATION;  Surgeon: Garner Nash, DO;  Location: [facility];  Service: Pulmonary;  Laterality: Left;   VIDEO BRONCHOSCOPY WITH ENDOBRONCHIAL ULTRASOUND Bilateral 05/20/2021   Procedure: VIDEO BRONCHOSCOPY WITH ENDOBRONCHIAL ULTRASOUND;  Surgeon: Garner Nash, DO;  Location: [facility];  Service: Pulmonary;  Laterality: Bilateral;   WISDOM TOOTH EXTRACTION      REVIEW OF SYSTEMS:   Constitutional: Positive for fatigue. Negative for chills and fever. HENT: Negative for mouth sores, nosebleeds, sore throat and trouble swallowing.   Eyes: Negative for eye problems and icterus.  Respiratory: Positive for baseline productive cough.  Positive for occasional shortness of breath with exertion.  Negative for hemoptysis and wheezing.   Cardiovascular: Negative for chest pain and leg swelling.  Gastrointestinal:  Negative for abdominal pain, diarrhea,  constipation, nausea and vomiting.  Genitourinary: Negative for bladder incontinence, difficulty urinating, dysuria, frequency and hematuria.   Musculoskeletal: Negative for back pain, gait problem, neck pain and neck stiffness.  Skin: Negative for itching and rash.  Neurological: Positive for questionable syncopal episode? Negative for dizziness, extremity weakness, gait problem, light-headedness and seizures.  Hematological: Negative for adenopathy. Does not bruise/bleed easily.  Psychiatric/Behavioral: Negative for confusion, depression and sleep disturbance. The patient is not nervous/anxious.        PHYSICAL EXAMINATION:  Blood pressure 126/63, pulse 82, temperature 97.6 F (36.4 C), temperature source Axillary, resp. rate 17, height _0  (1.702 m), weight 137 lb 6.4 oz (62.3 kg), SpO2 97 %.  ECOG PERFORMANCE STATUS: 1  Physical Exam  Constitutional: Oriented to person, place, and time and well-developed, well-nourished, and in no distress.   HENT:  Head: Normocephalic and atraumatic.  Mouth/Throat: Oropharynx is clear and moist. No oropharyngeal exudate. Poor dentition with multiple missing teeth and dental caries. Has a small lesion on her right upper gum line.  Eyes: Conjunctivae are normal. Right eye exhibits no discharge. Left eye exhibits no discharge. No scleral icterus.  Neck: Normal range of motion. Neck supple.  Cardiovascular: Normal rate, regular rhythm, normal heart sounds and intact distal pulses.   Pulmonary/Chest: Effort normal.  Decreased breath sounds in all lung fields.  No respiratory distress. No wheezes. No rales.  Abdominal: Soft. Bowel sounds are normal. Exhibits no distension and no mass. There is no tenderness.  Musculoskeletal: Normal range of motion. Exhibits no edema.  Lymphadenopathy:    No cervical adenopathy.  Neurological: Alert and oriented to person, place, and time. Exhibits muscle wasting. tone. Gait normal. Coordination normal.  Skin: Skin is  warm and dry. No rash noted. Not diaphoretic. No erythema. No pallor.  Psychiatric: Mood, memory and judgment normal.  Vitals reviewed.  LABORATORY DATA: Lab Results  Component Value Date   WBC 3.9 (L) 12/29/2021   HGB 11.0 (L) 12/29/2021   HCT 33.3 (L) 12/29/2021   MCV 108.1 (H) 12/29/2021   PLT 373 12/29/2021      Chemistry      Component Value Date/Time   NA 140 12/29/2021 1045   NA 141 04/12/2021 1356   K 3.7 12/29/2021 1045   CL 104 12/29/2021 1045   CO2 30 12/29/2021 1045  BUN 9 12/29/2021 1045   BUN 13 04/12/2021 1356   CREATININE 0.94 12/29/2021 1045   CREATININE 0.99 08/29/2016 1130      Component Value Date/Time   CALCIUM 9.6 12/29/2021 1045   ALKPHOS 69 12/29/2021 1045   AST 27 12/29/2021 1045   ALT 18 12/29/2021 1045   BILITOT 0.3 12/29/2021 1045       RADIOGRAPHIC STUDIES:  No results found.   ASSESSMENT/PLAN:  This is a very pleasant 71 year old Caucasian female diagnosed with stage IV (T2 a, N2, M1 B) non-small cell lung cancer, adenocarcinoma.  She presented with a left upper lobe lung mass in addition to left hilar and precarinal metastatic adenopathy as well as a small left lower lobe pulmonary nodule and a solitary left frontal brain metastasis.  This was diagnosed in July 2022.  Her molecular studies show that she is positive for K-ras G12C mutation and her PD-L1 expression is negative.  She completed SRS to the solitary brain metastasis under the care of Dr. Tammi Klippel which was completed on 06/23/2021.  She had a repeat brain MRI performed on 09/15/2021 which shows a new 1 mm and 3 mm metastatic brain lesions.  She received SRS on 09/28/2021.  She completed concurrent chemoradiation for the locally advanced disease in the chest with carboplatin for an AUC of 2 and paclitaxel 45 mg per metered squared.  She is status post 6 cycles.  She had been tolerating treatment well without any concerning adverse side effects.   The patient is currently  undergoing systemic chemotherapy with carboplatin AUC 5, Alimta 500 mg per metered squared, Keytruda 20 mg IV every 3 weeks.  She is status post 5 cycles.  She started maintenance alimta and Keytruda starting from cycle #5.   Labs were reviewed.  Recommend that she proceed with cycle #6 today as scheduled.  I will arrange for a restaging CT scan before starting the next cycle of treatment.   We will see her back for a follow up visit in 3 weeks for evaluation and repeat blood work before starting cycle #7.   Regarding the patient's questionable syncopal episode a few days ago, she is expected to have a brain MRI next week.   She denies any palpitations, extremity weakness, speech changes, lower extremity swelling, or calf pain.  Her case can be discussed at the brain tumor board next week and she has a dedicated follow-up visit with radiation oncology afterwards.  I discussed concerning signs and symptoms with the patient that would warrant emergency evaluation.  Discussed that she had any symptoms of stroke, arrhythmia, or other syncopal episodes, she should go to the emergency room for further evaluation and management. Encouraged her to hydrate more.   Encouraged the patient to resume her supplemental drinks due to her poor appetite.  The patient was advised to call immediately if she has any concerning symptoms in the interval. The patient voices understanding of current disease status and treatment options and is in agreement with the current care plan. All questions were answered. The patient knows to call the clinic with any problems, questions or concerns. We can certainly see the patient much sooner if necessary    Orders Placed This Encounter  Procedures   CT Chest W Contrast    Standing Status:   Future    Standing Expiration Date:   12/29/2022    Order Specific Question:   If indicated for the ordered procedure, I authorize the administration of contrast media per Radiology protocol  Answer:   Yes    Order Specific Question:   Preferred imaging location?    Answer:   [facility]   CT Abdomen Pelvis W Contrast    Standing Status:   Future    Standing Expiration Date:   12/29/2022    Order Specific Question:   If indicated for the ordered procedure, I authorize the administration of contrast media per Radiology protocol    Answer:   Yes    Order Specific Question:   Preferred imaging location?    Answer:   [facility]    Order Specific Question:   Is Oral Contrast requested for this exam?    Answer:   Yes, Per Radiology protocol     The total time spent in the appointment was 20-29 minutes.   Chanay Nugent L Tenna Lacko, PA-C 12/29/21

## 2021-12-29 ENCOUNTER — Inpatient Hospital Stay: Payer: Medicare HMO

## 2021-12-29 ENCOUNTER — Other Ambulatory Visit: Payer: Self-pay

## 2021-12-29 ENCOUNTER — Inpatient Hospital Stay (HOSPITAL_BASED_OUTPATIENT_CLINIC_OR_DEPARTMENT_OTHER): Payer: Medicare HMO | Admitting: Physician Assistant

## 2021-12-29 VITALS — BP 126/63 | HR 82 | Temp 97.6°F | Resp 17 | Ht 67.0 in | Wt 137.4 lb

## 2021-12-29 DIAGNOSIS — Z5111 Encounter for antineoplastic chemotherapy: Secondary | ICD-10-CM | POA: Diagnosis not present

## 2021-12-29 DIAGNOSIS — R5383 Other fatigue: Secondary | ICD-10-CM | POA: Diagnosis not present

## 2021-12-29 DIAGNOSIS — C3490 Malignant neoplasm of unspecified part of unspecified bronchus or lung: Secondary | ICD-10-CM

## 2021-12-29 DIAGNOSIS — C7951 Secondary malignant neoplasm of bone: Secondary | ICD-10-CM | POA: Diagnosis not present

## 2021-12-29 DIAGNOSIS — C3412 Malignant neoplasm of upper lobe, left bronchus or lung: Secondary | ICD-10-CM | POA: Diagnosis not present

## 2021-12-29 DIAGNOSIS — C7931 Secondary malignant neoplasm of brain: Secondary | ICD-10-CM | POA: Diagnosis not present

## 2021-12-29 DIAGNOSIS — R61 Generalized hyperhidrosis: Secondary | ICD-10-CM | POA: Diagnosis not present

## 2021-12-29 DIAGNOSIS — Z5112 Encounter for antineoplastic immunotherapy: Secondary | ICD-10-CM | POA: Diagnosis not present

## 2021-12-29 DIAGNOSIS — E039 Hypothyroidism, unspecified: Secondary | ICD-10-CM | POA: Diagnosis not present

## 2021-12-29 DIAGNOSIS — C779 Secondary and unspecified malignant neoplasm of lymph node, unspecified: Secondary | ICD-10-CM | POA: Diagnosis not present

## 2021-12-29 DIAGNOSIS — E785 Hyperlipidemia, unspecified: Secondary | ICD-10-CM | POA: Diagnosis not present

## 2021-12-29 LAB — CBC WITH DIFFERENTIAL (CANCER CENTER ONLY)
Abs Immature Granulocytes: 0.01 10*3/uL (ref 0.00–0.07)
Basophils Absolute: 0.1 10*3/uL (ref 0.0–0.1)
Basophils Relative: 1 %
Eosinophils Absolute: 0.1 10*3/uL (ref 0.0–0.5)
Eosinophils Relative: 1 %
HCT: 33.3 % — ABNORMAL LOW (ref 36.0–46.0)
Hemoglobin: 11 g/dL — ABNORMAL LOW (ref 12.0–15.0)
Immature Granulocytes: 0 %
Lymphocytes Relative: 35 %
Lymphs Abs: 1.4 10*3/uL (ref 0.7–4.0)
MCH: 35.7 pg — ABNORMAL HIGH (ref 26.0–34.0)
MCHC: 33 g/dL (ref 30.0–36.0)
MCV: 108.1 fL — ABNORMAL HIGH (ref 80.0–100.0)
Monocytes Absolute: 0.6 10*3/uL (ref 0.1–1.0)
Monocytes Relative: 15 %
Neutro Abs: 1.9 10*3/uL (ref 1.7–7.7)
Neutrophils Relative %: 48 %
Platelet Count: 373 10*3/uL (ref 150–400)
RBC: 3.08 MIL/uL — ABNORMAL LOW (ref 3.87–5.11)
RDW: 15 % (ref 11.5–15.5)
WBC Count: 3.9 10*3/uL — ABNORMAL LOW (ref 4.0–10.5)
nRBC: 0 % (ref 0.0–0.2)

## 2021-12-29 LAB — CMP (CANCER CENTER ONLY)
ALT: 18 U/L (ref 0–44)
AST: 27 U/L (ref 15–41)
Albumin: 4.1 g/dL (ref 3.5–5.0)
Alkaline Phosphatase: 69 U/L (ref 38–126)
Anion gap: 6 (ref 5–15)
BUN: 9 mg/dL (ref 8–23)
CO2: 30 mmol/L (ref 22–32)
Calcium: 9.6 mg/dL (ref 8.9–10.3)
Chloride: 104 mmol/L (ref 98–111)
Creatinine: 0.94 mg/dL (ref 0.44–1.00)
GFR, Estimated: >60 mL/min (ref >60)
Glucose, Bld: 100 mg/dL — ABNORMAL HIGH (ref 70–99)
Potassium: 3.7 mmol/L (ref 3.5–5.1)
Sodium: 140 mmol/L (ref 135–145)
Total Bilirubin: 0.3 mg/dL (ref 0.3–1.2)
Total Protein: 7.1 g/dL (ref 6.5–8.1)

## 2021-12-29 LAB — TSH: TSH: 11.19 u[IU]/mL — ABNORMAL HIGH (ref 0.308–3.960)

## 2021-12-29 MED ORDER — SODIUM CHLORIDE 0.9 % IV SOLN: Freq: Once | INTRAVENOUS | Status: AC

## 2021-12-29 MED ORDER — SODIUM CHLORIDE 0.9 % IV SOLN
200.0000 mg | Freq: Once | INTRAVENOUS | Status: AC
  Administered 2021-12-29: 200 mg via INTRAVENOUS
  Filled 2021-12-29: qty 200

## 2021-12-29 MED ORDER — CYANOCOBALAMIN 1000 MCG/ML IJ SOLN
1000.0000 ug | Freq: Once | INTRAMUSCULAR | Status: AC
  Administered 2021-12-29: 1000 ug via INTRAMUSCULAR
  Filled 2021-12-29: qty 1

## 2021-12-29 MED ORDER — FAMOTIDINE IN NACL 20-0.9 MG/50ML-% IV SOLN: 20.0000 mg | Freq: Once | INTRAVENOUS | Status: DC

## 2021-12-29 MED ORDER — SODIUM CHLORIDE 0.9 % IV SOLN
500.0000 mg/m2 | Freq: Once | INTRAVENOUS | Status: AC
  Administered 2021-12-29: 900 mg via INTRAVENOUS
  Filled 2021-12-29: qty 20

## 2021-12-29 MED ORDER — DIPHENHYDRAMINE HCL 50 MG/ML IJ SOLN: 25.0000 mg | Freq: Once | INTRAMUSCULAR | Status: DC

## 2021-12-29 MED ORDER — PROCHLORPERAZINE MALEATE 10 MG PO TABS
10.0000 mg | ORAL_TABLET | Freq: Once | ORAL | Status: AC
  Administered 2021-12-29: 10 mg via ORAL
  Filled 2021-12-29: qty 1

## 2021-12-29 NOTE — Patient Instructions (Signed)
[facility]COL[facility]  Discharge Instructions: Thank you for choosing [facility] to provide your oncology and hematology care.   If you have a lab appointment with the Cancer Center, please go directly to the Cancer Center and check in at the registration area.   Wear comfortable clothing and clothing appropriate for easy access to any Portacath or PICC line.   We strive to give you quality time with your provider. You may need to reschedule your appointment if you arrive late (15 or more minutes).  Arriving late affects you and other patients whose appointments are after yours.  Also, if you miss three or more appointments without notifying the office, you may be dismissed from the clinic at the provider's discretion.      For prescription refill requests, have your pharmacy contact our office and allow 72 hours for refills to be completed.    Today you received the following chemotherapy and/or immunotherapy agents: Keytruda/Alimta.      To help prevent nausea and vomiting after your treatment, we encourage you to take your nausea medication as directed.  BELOW ARE SYMPTOMS THAT SHOULD BE REPORTED IMMEDIATELY: *FEVER GREATER THAN 100.4 F (38 C) OR HIGHER *CHILLS OR SWEATING *NAUSEA AND VOMITING THAT IS NOT CONTROLLED WITH YOUR NAUSEA MEDICATION *UNUSUAL SHORTNESS OF BREATH *UNUSUAL BRUISING OR BLEEDING *URINARY PROBLEMS (pain or burning when urinating, or frequent urination) *BOWEL PROBLEMS (unusual diarrhea, constipation, pain near the anus) TENDERNESS IN MOUTH AND THROAT WITH OR WITHOUT PRESENCE OF ULCERS (sore throat, sores in mouth, or a toothache) UNUSUAL RASH, SWELLING OR PAIN  UNUSUAL VAGINAL DISCHARGE OR ITCHING   Items with * indicate a potential emergency and should be followed up as soon as possible or go to the Emergency Department if any problems should occur.  Please show the CHEMOTHERAPY ALERT CARD or IMMUNOTHERAPY ALERT CARD at  check-in to the Emergency Department and triage nurse.  Should you have questions after your visit or need to cancel or reschedule your appointment, please contact [facility]  Dept: [phone]  and follow the prompts.  Office hours are 8:00 a.m. to 4:30 p.m. Monday - Friday. Please note that voicemails left after 4:00 p.m. may not be returned until the following business day.  We are closed weekends and major holidays. You have access to a nurse at all times for urgent questions. Please call the main number to the clinic Dept: [phone] and follow the prompts.   For any non-urgent questions, you may also contact your provider using MyChart. We now offer e-Visits for anyone 18 and older to request care online for non-urgent symptoms. For details visit [email or web address].   Also download the MyChart app! Go to the app store, search "MyChart", open the app, select [facility], and log in with your MyChart username and password.  Due to Covid, a mask is required upon entering the hospital/clinic. If you do not have a mask, one will be given to you upon arrival. For doctor visits, patients may have 1 support person aged 18 or older with them. For treatment visits, patients cannot have anyone with them due to current Covid guidelines and our immunocompromised population.   

## 2021-12-30 ENCOUNTER — Ambulatory Visit
Admission: RE | Admit: 2021-12-30 | Discharge: 2021-12-30 | Disposition: A | Discharge status: Home / Self Care | Payer: Medicare HMO | Source: Ambulatory Visit | Attending: Radiation Oncology | Admitting: Radiation Oncology

## 2021-12-30 DIAGNOSIS — Z85118 Personal history of other malignant neoplasm of bronchus and lung: Secondary | ICD-10-CM | POA: Diagnosis not present

## 2021-12-30 DIAGNOSIS — C7931 Secondary malignant neoplasm of brain: Secondary | ICD-10-CM

## 2021-12-30 DIAGNOSIS — G9389 Other specified disorders of brain: Secondary | ICD-10-CM | POA: Diagnosis not present

## 2021-12-30 DIAGNOSIS — J3489 Other specified disorders of nose and nasal sinuses: Secondary | ICD-10-CM | POA: Diagnosis not present

## 2021-12-30 MED ORDER — GADOBENATE DIMEGLUMINE 529 MG/ML IV SOLN
12.0000 mL | Freq: Once | INTRAVENOUS | Status: AC | PRN
  Administered 2021-12-30: 12 mL via INTRAVENOUS

## 2022-01-02 ENCOUNTER — Encounter: Payer: Self-pay | Admitting: Urology

## 2022-01-02 ENCOUNTER — Inpatient Hospital Stay: Payer: Medicare HMO

## 2022-01-02 ENCOUNTER — Telehealth: Payer: Self-pay

## 2022-01-02 NOTE — Progress Notes (Signed)
Spoke w/ patient, verified identity, and began nursing interview. Patient reports dizziness upon standing, fatigue, and shortness of breath w/ exertion. No other issues reported at this time. ? ?Meaningful use complete. ? ?Reminded patient of her 9:30am-01/04/22 telephone appointment w/ Ashlyn Bruning PA-C. I left my extension [phone] in case patient needs anything. Patient verbalized understanding of information. ? ?Patient contact [phone] ?

## 2022-01-02 NOTE — Telephone Encounter (Signed)
Called patient x2 and left a voicemail reminder of patient's 9:30am-01/04/22 telephone appointment w/ Ashlyn Bruning PA-C. I left my extension [phone] and requested that patient return my call prior to appointment time, so that I may complete the nursing portion of the appointment.

## 2022-01-03 ENCOUNTER — Other Ambulatory Visit: Payer: Medicare HMO

## 2022-01-04 ENCOUNTER — Ambulatory Visit
Admission: RE | Admit: 2022-01-04 | Discharge: 2022-01-04 | Disposition: A | Discharge status: Home / Self Care | Payer: Medicare HMO | Source: Ambulatory Visit | Attending: Urology | Admitting: Urology

## 2022-01-04 DIAGNOSIS — C7931 Secondary malignant neoplasm of brain: Secondary | ICD-10-CM

## 2022-01-04 DIAGNOSIS — Z08 Encounter for follow-up examination after completed treatment for malignant neoplasm: Secondary | ICD-10-CM | POA: Diagnosis not present

## 2022-01-04 DIAGNOSIS — C3490 Malignant neoplasm of unspecified part of unspecified bronchus or lung: Secondary | ICD-10-CM

## 2022-01-04 DIAGNOSIS — C3412 Malignant neoplasm of upper lobe, left bronchus or lung: Secondary | ICD-10-CM | POA: Diagnosis not present

## 2022-01-04 NOTE — Progress Notes (Signed)
Radiation Oncology         [phone] ________________________________  Name: Jill [name] MRN: [id number]  Date: 01/04/2022  DOB: 12-Dec-1950  Post Treatment Note  CC: [name], MD  Curt Bears, MD  Diagnosis:   71 yo woman with stage IV NSCLC, adenocarcinoma of the left upper lung - Stage IVA - with brain metastases     Interval Since Last Radiation: 3 months 09/28/21: SRS//PTV2-3: The metastatic lesions  in the left occipital (3 mm) and left temporal (1 mm) were treated to 20 Gy in a single fraction  06/23/21: SRS// PTV1: The solitary 7 mm left frontal  brain metastasis was treated to 20 Gy in a single fraction   06/15/21 - 08/01/21: The primary tumor in the left lung and nodes were treated to 66 Gy in 33 fractions of 2 Gy (concurrent with chemotherapy).  Narrative:  I contacted the patient to conduct her routine scheduled 3 month follow up visit via telephone to spare the patient unnecessary potential exposure in the healthcare setting during the current COVID-19 pandemic.  The patient was notified in advance and gave permission to proceed with this visit format.    She tolerated her recent [facility] treatment relatively well without any ill side effects aside from moderate fatigue which she feels has now resolved.  She also continues with a persistent cough but denies hemoptysis, yellowish/green sputum, fever or chills.                           On review of systems, the patient states that she is doing well in general and is currently without complaints aside from a mild, persistent cough that is unchanged recently.  She specifically denies chest pain, increased shortness of breath or hemoptysis.  She reports a healthy appetite and is maintaining her weight.  She denies dysphagia, abdominal pain, nausea, vomiting or night sweats.  She has occasional, mild headaches but nothing that has been persistent or concerning.  She denies any decrease in her visual or auditory acuity,  dizziness/imbalance, focal weakness in the upper or lower extremities, tremor or seizure activity.  She initially completed 6 cycles of systemic therapy with carboplatin/paclitaxel systemic chemotherapy prior to switching to carboplatin, Alimta and Keytruda which she completed 5 cycles of and she has recently started maintenance therapy with Alimta/Keytruda on 12/29/2021 which she is tolerating well.  Overall, she is pleased with her progress to date.  Her 12th grandchild, 61th grand-daughter, was born 11/02/2021.  ALLERGIES:  is allergic to varenicline, ace inhibitors, prednisone, penicillins, and sulfa drugs cross reactors.  Meds: Current Outpatient Medications  Medication Sig Dispense Refill   acetaminophen (TYLENOL) 500 MG tablet Take 500 mg by mouth every 6 (six) hours as needed.     amLODipine (NORVASC) 10 MG tablet Take 1 tablet by mouth once daily 90 tablet 1   amoxicillin-clavulanate (AUGMENTIN) 875-125 MG tablet Take 1 tablet by mouth 2 (two) times daily. (Patient not taking: Reported on 10/26/2021) 12 tablet 0   aspirin EC 81 MG tablet Take 1 tablet (81 mg total) by mouth daily. 90 tablet 2   atorvastatin (LIPITOR) 40 MG tablet Take 1 tablet by mouth once daily 90 tablet 1   Cholecalciferol (VITAMIN D3) 250 MCG (10000 UT) capsule Take 10,000 mcg by mouth daily.     escitalopram (LEXAPRO) 10 MG tablet Take 1 tablet by mouth once daily 90 tablet 1   folic acid (FOLVITE) 1 MG tablet  Take 1 tablet (1 mg total) by mouth daily. 30 tablet 4   levothyroxine (SYNTHROID) 137 MCG tablet Take 1 tablet (137 mcg total) by mouth daily before breakfast. 30 tablet 1   losartan (COZAAR) 100 MG tablet Take 1 tablet by mouth once daily 90 tablet 1   metoprolol succinate (TOPROL-XL) 50 MG 24 hr tablet TAKE 1 TABLET BY MOUTH ONCE DAILY WITH MEALS OR  IMMEDIATELY  FOLLOWING 90 tablet 1   Multiple Vitamins-Minerals (MULTIVITAMIN WITH MINERALS) tablet Take 1 tablet by mouth daily.     nitroGLYCERIN (NITROSTAT)  0.4 MG SL tablet Place 1 tablet (0.4 mg total) under the tongue every 5 (five) minutes as needed. For chest pain (Patient not taking: Reported on 10/26/2021) 25 tablet 6   prochlorperazine (COMPAZINE) 10 MG tablet Take 1 tablet (10 mg total) by mouth every 6 (six) hours as needed for nausea or vomiting. (Patient not taking: Reported on 10/26/2021) 30 tablet 0   Vitamin A 2400 MCG (8000 UT) TABS Take 2,400 mg by mouth daily.     vitamin B-12 (CYANOCOBALAMIN) 1000 MCG tablet Take 1,000 mcg by mouth daily. (Patient not taking: Reported on 10/26/2021)     No current facility-administered medications for this encounter.    Physical Findings:  vitals were not taken for this visit.  Pain Assessment Pain Score: 0-No pain/10 Unable to assess due to telephone follow-up visit format.  Lab Findings: Lab Results  Component Value Date   WBC 3.9 (L) 12/29/2021   HGB 11.0 (L) 12/29/2021   HCT 33.3 (L) 12/29/2021   MCV 108.1 (H) 12/29/2021   PLT 373 12/29/2021     Radiographic Findings: MR Brain W Wo Contrast  Result Date: 12/31/2021 CLINICAL DATA:  Brain metastases, assess treatment response. Metastatic lung cancer. SRS to a left frontal brain metastasis on 06/23/2021 and subsequently to left occipital and left temporal metastases on 09/28/2021. EXAM: MRI HEAD WITHOUT AND WITH CONTRAST TECHNIQUE: Multiplanar, multiecho pulse sequences of the brain and surrounding structures were obtained without and with intravenous contrast. CONTRAST:  30mL MULTIHANCE GADOBENATE DIMEGLUMINE 529 MG/ML IV SOLN COMPARISON:  Head MRI 09/15/2021 FINDINGS: Brain: New lesions: None. Larger lesions: None. Stable or smaller lesions: 1. Decreased size of a treated 4 mm left frontal lesion (series 11, image 123) with decreased edema. 2. Resolution of the intervally treated left occipital lesion. 3. Resolution of the intervally treated left temporal lesion. Other brain findings: There is a new subcentimeter focus of hyperintense  trace diffusion weighted signal, subtly reduced ADC, and T2 hyperintensity in the left periatrial white matter compatible with an acute to subacute small vessel infarct. T2 hyperintensities elsewhere in the cerebral white matter bilaterally are unchanged and nonspecific but compatible with moderate chronic small vessel ischemic disease. Gliosis and chronic blood products are again noted in the right frontal lobe along the site of an old ventriculostomy catheter tract. No intracranial mass effect or extra-axial fluid collection is evident. Mild cerebral atrophy is within normal limits for age. Vascular: Major intracranial vascular flow voids are preserved. Previously treated basilar tip aneurysm. Skull and upper cervical spine: No suspicious marrow lesion. Sinuses/Orbits: Right cataract extraction. Mild mucosal thickening in the paranasal sinuses. Clear mastoid air cells. Other: None. IMPRESSION: 1. Resolution of treated left occipital and temporal lesions and decreased size of treated left frontal lesion. 2. No evidence of new intracranial metastases. 3. Subcentimeter acute to subacute infarct in the left periatrial white matter. 4. Moderate chronic small vessel ischemic disease. Electronically Signed   By:  Logan Bores M.D.   On: 12/31/2021 12:00    Impression/Plan: 1. 71 yo woman with brain metastasis secondary to metastatic, NSCLC, adenocarcinoma of the left upper lung - Stage IVA. She appears to have recovered well from the effects of her recent [facility] radiotherapy and is currently without complaints.  She recently completed 5 cycles of systemic therapy with carboplatin, Alimta and Keytruda, and started maintenance therapy with Alimta and Keytruda on 12/29/2021 which she is tolerating well.  Her most recent posttreatment MRI brain scan performed on 12/30/2021 shows excellent response to treatment with no evidence of new lesions or disease recurrence or progression.  She will continue management of her systemic  disease under the care and direction of Dr. Earlie Server.  Regarding the brain metastases, we will continue to monitor closely with serial MRI brain scans every 3 months going forward and plan to connect by telephone following each scan to review results and recommendations from the multidisciplinary brain tumor board.  She appears to have a good understanding of these recommendations and is comfortable and in agreement with the stated plan.  She knows to call at anytime in the interim with any questions or concerns related to her previous radiation.   Nicholos Johns, PA-C

## 2022-01-04 NOTE — Addendum Note (Signed)
Encounter addended by: Freeman Caldron, PA-C on: 01/04/2022 8:32 AM  Actions taken: Clinical Note Signed

## 2022-01-05 ENCOUNTER — Other Ambulatory Visit: Payer: Medicare HMO

## 2022-01-12 ENCOUNTER — Other Ambulatory Visit: Payer: Medicare HMO

## 2022-01-16 ENCOUNTER — Ambulatory Visit (HOSPITAL_COMMUNITY)
Admission: RE | Admit: 2022-01-16 | Discharge: 2022-01-16 | Disposition: A | Discharge status: Home / Self Care | Payer: Medicare HMO | Source: Ambulatory Visit | Attending: Physician Assistant | Admitting: Physician Assistant

## 2022-01-16 ENCOUNTER — Other Ambulatory Visit: Payer: Self-pay

## 2022-01-16 DIAGNOSIS — R911 Solitary pulmonary nodule: Secondary | ICD-10-CM | POA: Diagnosis not present

## 2022-01-16 DIAGNOSIS — K7689 Other specified diseases of liver: Secondary | ICD-10-CM | POA: Diagnosis not present

## 2022-01-16 DIAGNOSIS — C3490 Malignant neoplasm of unspecified part of unspecified bronchus or lung: Secondary | ICD-10-CM | POA: Diagnosis not present

## 2022-01-16 DIAGNOSIS — C3412 Malignant neoplasm of upper lobe, left bronchus or lung: Secondary | ICD-10-CM | POA: Diagnosis not present

## 2022-01-16 DIAGNOSIS — J432 Centrilobular emphysema: Secondary | ICD-10-CM | POA: Diagnosis not present

## 2022-01-16 MED ORDER — IOHEXOL 300 MG/ML  SOLN
100.0000 mL | Freq: Once | INTRAMUSCULAR | Status: AC | PRN
  Administered 2022-01-16: 100 mL via INTRAVENOUS

## 2022-01-16 MED ORDER — SODIUM CHLORIDE (PF) 0.9 % IJ SOLN
INTRAMUSCULAR | Status: AC
  Filled 2022-01-16: qty 50

## 2022-01-19 ENCOUNTER — Inpatient Hospital Stay: Payer: Medicare HMO | Attending: Internal Medicine

## 2022-01-19 ENCOUNTER — Other Ambulatory Visit: Payer: Self-pay

## 2022-01-19 ENCOUNTER — Inpatient Hospital Stay (HOSPITAL_BASED_OUTPATIENT_CLINIC_OR_DEPARTMENT_OTHER): Payer: Medicare HMO | Admitting: Internal Medicine

## 2022-01-19 ENCOUNTER — Inpatient Hospital Stay: Payer: Medicare HMO

## 2022-01-19 VITALS — BP 123/69 | HR 71 | Temp 96.8°F | Resp 20 | Ht 67.0 in | Wt 139.3 lb

## 2022-01-19 DIAGNOSIS — I700 Atherosclerosis of aorta: Secondary | ICD-10-CM | POA: Insufficient documentation

## 2022-01-19 DIAGNOSIS — Z79899 Other long term (current) drug therapy: Secondary | ICD-10-CM | POA: Diagnosis not present

## 2022-01-19 DIAGNOSIS — N281 Cyst of kidney, acquired: Secondary | ICD-10-CM | POA: Insufficient documentation

## 2022-01-19 DIAGNOSIS — R5383 Other fatigue: Secondary | ICD-10-CM | POA: Diagnosis not present

## 2022-01-19 DIAGNOSIS — C7931 Secondary malignant neoplasm of brain: Secondary | ICD-10-CM | POA: Diagnosis not present

## 2022-01-19 DIAGNOSIS — Z8719 Personal history of other diseases of the digestive system: Secondary | ICD-10-CM | POA: Diagnosis not present

## 2022-01-19 DIAGNOSIS — F32A Depression, unspecified: Secondary | ICD-10-CM | POA: Diagnosis not present

## 2022-01-19 DIAGNOSIS — Z880 Allergy status to penicillin: Secondary | ICD-10-CM | POA: Insufficient documentation

## 2022-01-19 DIAGNOSIS — Z882 Allergy status to sulfonamides status: Secondary | ICD-10-CM | POA: Insufficient documentation

## 2022-01-19 DIAGNOSIS — E785 Hyperlipidemia, unspecified: Secondary | ICD-10-CM | POA: Diagnosis not present

## 2022-01-19 DIAGNOSIS — C3412 Malignant neoplasm of upper lobe, left bronchus or lung: Secondary | ICD-10-CM

## 2022-01-19 DIAGNOSIS — Z87440 Personal history of urinary (tract) infections: Secondary | ICD-10-CM | POA: Insufficient documentation

## 2022-01-19 DIAGNOSIS — Z5111 Encounter for antineoplastic chemotherapy: Secondary | ICD-10-CM | POA: Diagnosis not present

## 2022-01-19 DIAGNOSIS — J432 Centrilobular emphysema: Secondary | ICD-10-CM | POA: Diagnosis not present

## 2022-01-19 DIAGNOSIS — I252 Old myocardial infarction: Secondary | ICD-10-CM | POA: Diagnosis not present

## 2022-01-19 DIAGNOSIS — I6782 Cerebral ischemia: Secondary | ICD-10-CM | POA: Diagnosis not present

## 2022-01-19 DIAGNOSIS — Z888 Allergy status to other drugs, medicaments and biological substances status: Secondary | ICD-10-CM | POA: Insufficient documentation

## 2022-01-19 DIAGNOSIS — R69 Illness, unspecified: Secondary | ICD-10-CM | POA: Diagnosis not present

## 2022-01-19 DIAGNOSIS — Z5112 Encounter for antineoplastic immunotherapy: Secondary | ICD-10-CM

## 2022-01-19 DIAGNOSIS — Z923 Personal history of irradiation: Secondary | ICD-10-CM | POA: Diagnosis not present

## 2022-01-19 DIAGNOSIS — E039 Hypothyroidism, unspecified: Secondary | ICD-10-CM | POA: Diagnosis not present

## 2022-01-19 DIAGNOSIS — Z9221 Personal history of antineoplastic chemotherapy: Secondary | ICD-10-CM | POA: Diagnosis not present

## 2022-01-19 LAB — CBC WITH DIFFERENTIAL (CANCER CENTER ONLY)
Abs Immature Granulocytes: 0.01 10*3/uL (ref 0.00–0.07)
Basophils Absolute: 0.1 10*3/uL (ref 0.0–0.1)
Basophils Relative: 2 %
Eosinophils Absolute: 0.2 10*3/uL (ref 0.0–0.5)
Eosinophils Relative: 3 %
HCT: 30.5 % — ABNORMAL LOW (ref 36.0–46.0)
Hemoglobin: 10.4 g/dL — ABNORMAL LOW (ref 12.0–15.0)
Immature Granulocytes: 0 %
Lymphocytes Relative: 20 %
Lymphs Abs: 1.1 10*3/uL (ref 0.7–4.0)
MCH: 36.7 pg — ABNORMAL HIGH (ref 26.0–34.0)
MCHC: 34.1 g/dL (ref 30.0–36.0)
MCV: 107.8 fL — ABNORMAL HIGH (ref 80.0–100.0)
Monocytes Absolute: 0.7 10*3/uL (ref 0.1–1.0)
Monocytes Relative: 14 %
Neutro Abs: 3.3 10*3/uL (ref 1.7–7.7)
Neutrophils Relative %: 61 %
Platelet Count: 318 10*3/uL (ref 150–400)
RBC: 2.83 MIL/uL — ABNORMAL LOW (ref 3.87–5.11)
RDW: 14.5 % (ref 11.5–15.5)
WBC Count: 5.4 10*3/uL (ref 4.0–10.5)
nRBC: 0 % (ref 0.0–0.2)

## 2022-01-19 LAB — CMP (CANCER CENTER ONLY)
ALT: 17 U/L (ref 0–44)
AST: 28 U/L (ref 15–41)
Albumin: 3.8 g/dL (ref 3.5–5.0)
Alkaline Phosphatase: 79 U/L (ref 38–126)
Anion gap: 5 (ref 5–15)
BUN: 8 mg/dL (ref 8–23)
CO2: 30 mmol/L (ref 22–32)
Calcium: 9.4 mg/dL (ref 8.9–10.3)
Chloride: 105 mmol/L (ref 98–111)
Creatinine: 0.94 mg/dL (ref 0.44–1.00)
GFR, Estimated: >60 mL/min (ref >60)
Glucose, Bld: 99 mg/dL (ref 70–99)
Potassium: 3.7 mmol/L (ref 3.5–5.1)
Sodium: 140 mmol/L (ref 135–145)
Total Bilirubin: 0.3 mg/dL (ref 0.3–1.2)
Total Protein: 7 g/dL (ref 6.5–8.1)

## 2022-01-19 MED ORDER — SODIUM CHLORIDE 0.9 % IV SOLN: Freq: Once | INTRAVENOUS | Status: AC

## 2022-01-19 MED ORDER — SODIUM CHLORIDE 0.9 % IV SOLN
500.0000 mg/m2 | Freq: Once | INTRAVENOUS | Status: AC
  Administered 2022-01-19: 900 mg via INTRAVENOUS
  Filled 2022-01-19: qty 20

## 2022-01-19 MED ORDER — PROCHLORPERAZINE MALEATE 10 MG PO TABS
10.0000 mg | ORAL_TABLET | Freq: Once | ORAL | Status: AC
  Administered 2022-01-19: 10 mg via ORAL
  Filled 2022-01-19: qty 1

## 2022-01-19 MED ORDER — SODIUM CHLORIDE 0.9 % IV SOLN
200.0000 mg | Freq: Once | INTRAVENOUS | Status: AC
  Administered 2022-01-19: 200 mg via INTRAVENOUS
  Filled 2022-01-19: qty 200

## 2022-01-19 NOTE — Progress Notes (Signed)
?    [facility] ?[phone]) [phone]   [phone] ? ?OFFICE PROGRESS NOTE ? ?[name], MD ?[address] ?[place] [address] ? ?DIAGNOSIS: Stage IV (T2 a, N2, M1b) non-small cell lung cancer, adenocarcinoma presented with left upper lobe lung mass in addition to left hilar and precarinal metastatic adenopathy and small left lower lobe pulmonary nodule in addition to solitary left frontal brain metastasis diagnosed in July 2022. ? ?Molecular studies by [facility] showed  ?Positive KRAS G12C mutation ?PD-L1 expression was less than 1% ? ? ?PRIOR THERAPY:  ?1) Status post SRS to the solitary brain metastasis. ?20 Treatment for the locally advanced disease in the chest with carboplatin for AUC of 2 and paclitaxel 45 Mg/M2.  Status post 6 cycles.  Last dose was given 07/25/2021. ? ?CURRENT THERAPY: Systemic chemotherapy with carboplatin for AUC of 5, Alimta 500 Mg/M2 and Keytruda 200 Mg IV every 3 weeks.  First dose August 31, 2021.  Status post 6 cycles.  Starting from cycle #5 the patient will be on maintenance treatment with Alimta and Keytruda every 3 weeks. ? ?INTERVAL HISTORY: ?Jill [name] 71 y.o. female returns to the clinic today for follow-up visit.  The patient is feeling fine today with no concerning complaints except for fatigue.  She denied having any current chest pain, shortness of breath, cough or hemoptysis.  She denied having any fever or chills.  She has no nausea, vomiting, diarrhea or constipation.  She has no headache or visual changes.  The patient continues to tolerate her treatment fairly well except for the fatigue.  She had repeat CT scan of the chest, abdomen pelvis performed recently and she is here for evaluation and discussion of her scan results. ? ? ?MEDICAL HISTORY: ?Past Medical History:  ?Diagnosis Date  ? Anxiety   ? ON PAXIL, XANAX  ? AVM (arteriovenous malformation) brain   ? s/p stent/coil  ? Blood transfusion   ? x 2  ? Cancer  [facility])   ? Left lung  ? COPD (chronic obstructive pulmonary disease) ([place])   ? no inhaler, no oxygen  ? Coronary artery disease   ? 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 Depression   ? Dizziness   ? Dyspnea   ? with exertion, no oxygen  ? Fatigue   ? Foot injury 06/01/2017  ? right - RESOLVED, no longer an issue per patient 05/18/21  ? GERD (gastroesophageal reflux disease)   ? Headache(784.0)   ? UNRUPTURED CEREBRAL ANEURYSM  ? Hyperlipidemia   ? Hypertension   ? Hypothyroidism   ? Infected cyst of skin 09/18/2013  ? Left knee pain 06/05/2012  ? Lung mass   ? Left lung  ? Myocardial infarction [facility])   ? Normal nuclear stress test Ju;y 2012  ? No ischemia. EF 70%; fixed defect involving septum, inferoseptal and inferior wall  ? Pain, dental 02/06/2017  ? Poor dental hygiene   ? Retroperitoneal bleeding   ? Following cardiac cath  ? Tobacco abuse   ? Urine discoloration 09/18/2013  ? UTI (lower urinary tract infection) 10/16/2013  ? ? ?ALLERGIES:  is allergic to varenicline, ace inhibitors, prednisone, penicillins, and sulfa drugs cross reactors. ? ?MEDICATIONS:  ?Current Outpatient Medications  ?Medication Sig Dispense Refill  ? acetaminophen (TYLENOL) 500 MG tablet Take 500 mg by mouth every 6 (six) hours as needed.    ? amLODipine (NORVASC) 10 MG tablet Take 1 tablet  by mouth once daily 90 tablet 1  ? amoxicillin-clavulanate (AUGMENTIN) 875-125 MG tablet Take 1 tablet by mouth 2 (two) times daily. (Patient not taking: Reported on 10/26/2021) 12 tablet 0  ? aspirin EC 81 MG tablet Take 1 tablet (81 mg total) by mouth daily. 90 tablet 2  ? atorvastatin (LIPITOR) 40 MG tablet Take 1 tablet by mouth once daily 90 tablet 1  ? Cholecalciferol (VITAMIN D3) 250 MCG (10000 UT) capsule Take 10,000 mcg by mouth daily.    ? escitalopram (LEXAPRO) 10 MG tablet Take 1 tablet by mouth once daily 90 tablet 1  ? folic acid (FOLVITE) 1 MG tablet Take 1 tablet (1 mg total) by mouth daily. 30 tablet 4  ?  levothyroxine (SYNTHROID) 137 MCG tablet Take 1 tablet (137 mcg total) by mouth daily before breakfast. 30 tablet 1  ? losartan (COZAAR) 100 MG tablet Take 1 tablet by mouth once daily 90 tablet 1  ? metoprolol succinate (TOPROL-XL) 50 MG 24 hr tablet TAKE 1 TABLET BY MOUTH ONCE DAILY WITH MEALS OR  IMMEDIATELY  FOLLOWING 90 tablet 1  ? Multiple Vitamins-Minerals (MULTIVITAMIN WITH MINERALS) tablet Take 1 tablet by mouth daily.    ? nitroGLYCERIN (NITROSTAT) 0.4 MG SL tablet Place 1 tablet (0.4 mg total) under the tongue every 5 (five) minutes as needed. For chest pain (Patient not taking: Reported on 10/26/2021) 25 tablet 6  ? prochlorperazine (COMPAZINE) 10 MG tablet Take 1 tablet (10 mg total) by mouth every 6 (six) hours as needed for nausea or vomiting. (Patient not taking: Reported on 10/26/2021) 30 tablet 0  ? Vitamin A 2400 MCG (8000 UT) TABS Take 2,400 mg by mouth daily.    ? vitamin B-12 (CYANOCOBALAMIN) 1000 MCG tablet Take 1,000 mcg by mouth daily. (Patient not taking: Reported on 10/26/2021)    ? ?No current facility-administered medications for this visit.  ? ? ?SURGICAL HISTORY:  ?Past Surgical History:  ?Procedure Laterality Date  ? CARDIAC CATHETERIZATION  01/02/2007  ? IT REVEALS MILD INFERIOR WALL HYPOKINESIS. THE EJECTION FRACTION IS AROUND 50%  ? COLONOSCOPY    ? CORONARY ARTERY BYPASS GRAFT  11/06/2006  ? LIMA to LAD, SVG to DX, SVG to LCX & SVG to OM 1 & 2, and SVG to PD  ? CORONARY STENT PLACEMENT    ? Remote past stent to RCA  ? EYE SURGERY Right   ? cataracts removed  ? HEMORRHOID SURGERY  11/07/1987  ? UPPER GI ENDOSCOPY    ? VENTRICULOSTOMY  10/06/2011  ? Procedure: VENTRICULOSTOMY;  Surgeon: Winfield Cunas;  Location: [facility] NEURO ORS;  Service: Neurosurgery;  Laterality: Right;  Insertion of Ventriculostomy Catheter  ? VIDEO BRONCHOSCOPY WITH ENDOBRONCHIAL NAVIGATION Left 05/20/2021  ? Procedure: VIDEO BRONCHOSCOPY WITH ENDOBRONCHIAL NAVIGATION;  Surgeon: Garner Nash, DO;  Location: [facility];  Service: Pulmonary;  Laterality: Left;  ? VIDEO BRONCHOSCOPY WITH ENDOBRONCHIAL ULTRASOUND Bilateral 05/20/2021  ? Procedure: VIDEO BRONCHOSCOPY WITH ENDOBRONCHIAL ULTRASOUND;  Surgeon: Garner Nash, DO;  Location: [facility];  Service: Pulmonary;  Laterality: Bilateral;  ? WISDOM TOOTH EXTRACTION    ? ? ?REVIEW OF SYSTEMS:  Constitutional: positive for fatigue ?Eyes: negative ?Ears, nose, mouth, throat, and face: negative ?Respiratory: negative ?Cardiovascular: negative ?Gastrointestinal: negative ?Genitourinary:negative ?Integument/breast: negative ?Hematologic/lymphatic: negative ?Musculoskeletal:negative ?Neurological: negative ?Behavioral/Psych: negative ?Endocrine: negative ?Allergic/Immunologic: negative  ? ?PHYSICAL EXAMINATION: General appearance: alert, cooperative, fatigued, and no distress ?Head: Normocephalic, without obvious abnormality, atraumatic ?Neck: no adenopathy, no JVD, supple, symmetrical, trachea midline, and thyroid  not enlarged, symmetric, no tenderness/mass/nodules ?Lymph nodes: Cervical, supraclavicular, and axillary nodes normal. ?Resp: clear to auscultation bilaterally ?Back: symmetric, no curvature. ROM normal. No CVA tenderness. ?Cardio: regular rate and rhythm, S1, S2 normal, no murmur, click, rub or gallop ?GI: soft, non-tender; bowel sounds normal; no masses,  no organomegaly ?Extremities: extremities normal, atraumatic, no cyanosis or edema ?Neurologic: Alert and oriented X 3, normal strength and tone. Normal symmetric reflexes. Normal coordination and gait ? ?ECOG PERFORMANCE STATUS: 1 - Symptomatic but completely ambulatory ? ?Blood pressure 123/69, pulse 71, temperature (!) 96.8 ?F (36 ?C), temperature source Tympanic, resp. rate 20, height _0  (1.702 m), weight 139 lb 4.8 oz (63.2 kg), SpO2 98 %. ? ?LABORATORY DATA: ?Lab Results  ?Component Value Date  ? WBC 5.4 01/19/2022  ? HGB 10.4 (L) 01/19/2022  ? HCT 30.5 (L) 01/19/2022  ? MCV 107.8 (H) 01/19/2022  ? PLT 318  01/19/2022  ? ? ?  Chemistry   ?   ?Component Value Date/Time  ? NA 140 12/29/2021 1045  ? NA 141 04/12/2021 1356  ? K 3.7 12/29/2021 1045  ? CL 104 12/29/2021 1045  ? CO2 30 12/29/2021 1045  ? BUN 9 12/29/2021 1045  ?

## 2022-01-19 NOTE — Patient Instructions (Signed)
[facility]  Discharge Instructions: ?Thank you for choosing [facility] to provide your oncology and hematology care.  ? ?If you have a lab appointment with the [facility], please go directly to the [facility] and check in at the registration area. ?  ?Wear comfortable clothing and clothing appropriate for easy access to any Portacath or PICC line.  ? ?We strive to give you quality time with your provider. You may need to reschedule your appointment if you arrive late (15 or more minutes).  Arriving late affects you and other patients whose appointments are after yours.  Also, if you miss three or more appointments without notifying the office, you may be dismissed from the clinic at the provider?s discretion.    ?  ?For prescription refill requests, have your pharmacy contact our office and allow 72 hours for refills to be completed.   ? ?Today you received the following chemotherapy and/or immunotherapy agents Pemetrexed, Keytruda.    ?  ?To help prevent nausea and vomiting after your treatment, we encourage you to take your nausea medication as directed. ? ?BELOW ARE SYMPTOMS THAT SHOULD BE REPORTED IMMEDIATELY: ?*FEVER GREATER THAN 100.4 F (38 ?C) OR HIGHER ?*CHILLS OR SWEATING ?*NAUSEA AND VOMITING THAT IS NOT CONTROLLED WITH YOUR NAUSEA MEDICATION ?*UNUSUAL SHORTNESS OF BREATH ?*UNUSUAL BRUISING OR BLEEDING ?*URINARY PROBLEMS (pain or burning when urinating, or frequent urination) ?*BOWEL PROBLEMS (unusual diarrhea, constipation, pain near the anus) ?TENDERNESS IN MOUTH AND THROAT WITH OR WITHOUT PRESENCE OF ULCERS (sore throat, sores in mouth, or a toothache) ?UNUSUAL RASH, SWELLING OR PAIN  ?UNUSUAL VAGINAL DISCHARGE OR ITCHING  ? ?Items with * indicate a potential emergency and should be followed up as soon as possible or go to the Emergency Department if any problems should occur. ? ?Please show the CHEMOTHERAPY ALERT CARD or IMMUNOTHERAPY ALERT CARD at  check-in to the Emergency Department and triage nurse. ? ?Should you have questions after your visit or need to cancel or reschedule your appointment, please contact [facility]  Dept: [phone]  and follow the prompts.  Office hours are 8:00 a.m. to 4:30 p.m. Monday - Friday. Please note that voicemails left after 4:00 p.m. may not be returned until the following business day.  We are closed weekends and major holidays. You have access to a nurse at all times for urgent questions. Please call the main number to the clinic Dept: [phone] and follow the prompts. ? ? ?For any non-urgent questions, you may also contact your provider using MyChart. We now offer e-Visits for anyone 71 and older to request care online for non-urgent symptoms. For details visit [email or web address]. ?  ?Also download the MyChart app! Go to the app store, search "MyChart", open the app, select [facility], and log in with your MyChart username and password. ? ?Due to Covid, a mask is required upon entering the hospital/clinic. If you do not have a mask, one will be given to you upon arrival. For doctor visits, patients may have 1 support person aged 90 or older with them. For treatment visits, patients cannot have anyone with them due to current Covid guidelines and our immunocompromised population.  ? ?

## 2022-01-20 ENCOUNTER — Other Ambulatory Visit: Payer: Medicare HMO

## 2022-01-24 ENCOUNTER — Other Ambulatory Visit: Payer: Self-pay | Admitting: Internal Medicine

## 2022-01-26 ENCOUNTER — Other Ambulatory Visit: Payer: Medicare HMO

## 2022-01-31 ENCOUNTER — Other Ambulatory Visit: Payer: Self-pay | Admitting: Radiation Therapy

## 2022-01-31 DIAGNOSIS — C7931 Secondary malignant neoplasm of brain: Secondary | ICD-10-CM

## 2022-02-02 ENCOUNTER — Other Ambulatory Visit: Payer: Medicare HMO

## 2022-02-07 NOTE — Progress Notes (Deleted)
[facility] ?OFFICE PROGRESS NOTE ? ?Jill Feil, MD ?[address] ?[place] [address] ? ?DIAGNOSIS: Stage IV (T2 a, N2, M1b) non-small cell lung cancer, adenocarcinoma presented with left upper lobe lung mass in addition to left hilar and precarinal metastatic adenopathy and small left lower lobe pulmonary nodule in addition to solitary left frontal brain metastasis diagnosed in July 2022. ?  ?Molecular studies by [employer] showed  ?Positive KRAS G12C mutation ?PD-L1 expression was less than 1% ? ?PRIOR THERAPY: ?1) Status post SRS to the solitary brain metastasis under the care of Dr. Tammi Klippel. Completed on 06/23/21 ?2)  Treatment for the locally advanced disease in the chest with carboplatin for AUC of 2 and paclitaxel 45 Mg/M2.  Status post 6 cycles.  Last dose was given 07/25/2021. ?3) SRS to the additional metastatic brain lesions on 09/28/22 ? ?CURRENT THERAPY: Systemic chemotherapy with carboplatin for AUC of 5, Alimta 500 Mg/M2 and Keytruda 200 Mg IV every 3 weeks.  First dose August 31, 2021. Status post 7 cycles. Starting from cycle #5, she will start Alimta and keytruda maintenance IV every 3 weeks.  ?  ? ?INTERVAL HISTORY: ?Jill [name] 71 y.o. female returns the clinic for a follow up visit. She was last seen in the clinic 3 weeks ago. She is feeling fairly well today without any concerning complaints. She is currently undergoing chemotherapy and immunotherapy. She is tolerating treatment fairly well except for fatigue. She is scheduled to start maintenance treatment at her last appointment with alimta and [place]. She denies fevers or chills. She reports she has night sweats due to a warm sleeping environment. She reports fluctuating appetite. She is drinking *** supplemental drinks.  Her weight is stable. She reports some dyspnea on exertion with certain activities such as climbing the stairs. She denies significant cough, hemoptysis, or chest pain. Denies nausea,  vomiting, diarrhea, or constipation. She denies rashes or skin changes. She is seeing her PCP who is monitoring her hypothyroidism. She is rechecking her labs in a few weeks.  The patient denies any headache or visual changes.  She is followed closely by neuro oncology for history of metastatic disease to the brain.  She had a repeat brain MRI in February 2023 which showed a good response to treatment.  There monitoring her closely with repeat brain MRIs every 3 months.  She is here today for evaluation and repeat blood work before starting cycle #8.   ? ? ?MEDICAL HISTORY: ?Past Medical History:  ?Diagnosis Date  ? Anxiety   ? ON PAXIL, XANAX  ? AVM (arteriovenous malformation) brain   ? s/p stent/coil  ? Blood transfusion   ? x 2  ? Cancer [facility])   ? Left lung  ? COPD (chronic obstructive pulmonary disease) ([place])   ? no inhaler, no oxygen  ? Coronary artery disease   ? 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 Depression   ? Dizziness   ? Dyspnea   ? with exertion, no oxygen  ? Fatigue   ? Foot injury 06/01/2017  ? right - RESOLVED, no longer an issue per patient 05/18/21  ? GERD (gastroesophageal reflux disease)   ? Headache(784.0)   ? UNRUPTURED CEREBRAL ANEURYSM  ? Hyperlipidemia   ? Hypertension   ? Hypothyroidism   ? Infected cyst of skin 09/18/2013  ? Left knee pain 06/05/2012  ? Lung mass   ? Left lung  ? Myocardial infarction [facility])   ?  Normal nuclear stress test Ju;y 2012  ? No ischemia. EF 70%; fixed defect involving septum, inferoseptal and inferior wall  ? Pain, dental 02/06/2017  ? Poor dental hygiene   ? Retroperitoneal bleeding   ? Following cardiac cath  ? Tobacco abuse   ? Urine discoloration 09/18/2013  ? UTI (lower urinary tract infection) 10/16/2013  ? ? ?ALLERGIES:  is allergic to varenicline, ace inhibitors, prednisone, penicillins, and sulfa drugs cross reactors. ? ?MEDICATIONS:  ?Current Outpatient Medications  ?Medication Sig Dispense Refill  ? acetaminophen (TYLENOL) 500 MG  tablet Take 500 mg by mouth every 6 (six) hours as needed.    ? amLODipine (NORVASC) 10 MG tablet Take 1 tablet by mouth once daily 90 tablet 1  ? amoxicillin-clavulanate (AUGMENTIN) 875-125 MG tablet Take 1 tablet by mouth 2 (two) times daily. (Patient not taking: Reported on 10/26/2021) 12 tablet 0  ? aspirin EC 81 MG tablet Take 1 tablet (81 mg total) by mouth daily. 90 tablet 2  ? atorvastatin (LIPITOR) 40 MG tablet Take 1 tablet by mouth once daily 90 tablet 1  ? Cholecalciferol (VITAMIN D3) 250 MCG (10000 UT) capsule Take 10,000 mcg by mouth daily.    ? escitalopram (LEXAPRO) 10 MG tablet Take 1 tablet by mouth once daily 90 tablet 1  ? folic acid (FOLVITE) 1 MG tablet Take 1 tablet by mouth once daily 30 tablet 0  ? levothyroxine (SYNTHROID) 137 MCG tablet Take 1 tablet (137 mcg total) by mouth daily before breakfast. 30 tablet 1  ? losartan (COZAAR) 100 MG tablet Take 1 tablet by mouth once daily 90 tablet 1  ? metoprolol succinate (TOPROL-XL) 50 MG 24 hr tablet TAKE 1 TABLET BY MOUTH ONCE DAILY WITH MEALS OR  IMMEDIATELY  FOLLOWING 90 tablet 1  ? Multiple Vitamins-Minerals (MULTIVITAMIN WITH MINERALS) tablet Take 1 tablet by mouth daily.    ? nitroGLYCERIN (NITROSTAT) 0.4 MG SL tablet Place 1 tablet (0.4 mg total) under the tongue every 5 (five) minutes as needed. For chest pain (Patient not taking: Reported on 10/26/2021) 25 tablet 6  ? prochlorperazine (COMPAZINE) 10 MG tablet Take 1 tablet (10 mg total) by mouth every 6 (six) hours as needed for nausea or vomiting. (Patient not taking: Reported on 10/26/2021) 30 tablet 0  ? Vitamin A 2400 MCG (8000 UT) TABS Take 2,400 mg by mouth daily.    ? vitamin B-12 (CYANOCOBALAMIN) 1000 MCG tablet Take 1,000 mcg by mouth daily. (Patient not taking: Reported on 10/26/2021)    ? ?No current facility-administered medications for this visit.  ? ? ?SURGICAL HISTORY:  ?Past Surgical History:  ?Procedure Laterality Date  ? CARDIAC CATHETERIZATION  01/02/2007  ? IT REVEALS  MILD INFERIOR WALL HYPOKINESIS. THE EJECTION FRACTION IS AROUND 50%  ? COLONOSCOPY    ? CORONARY ARTERY BYPASS GRAFT  11/06/2006  ? LIMA to LAD, SVG to DX, SVG to LCX & SVG to OM 1 & 2, and SVG to PD  ? CORONARY STENT PLACEMENT    ? Remote past stent to RCA  ? EYE SURGERY Right   ? cataracts removed  ? HEMORRHOID SURGERY  11/07/1987  ? UPPER GI ENDOSCOPY    ? VENTRICULOSTOMY  10/06/2011  ? Procedure: VENTRICULOSTOMY;  Surgeon: Winfield Cunas;  Location: [place] [facility];  Service: Neurosurgery;  Laterality: Right;  Insertion of Ventriculostomy Catheter  ? VIDEO BRONCHOSCOPY WITH ENDOBRONCHIAL NAVIGATION Left 05/20/2021  ? Procedure: VIDEO BRONCHOSCOPY WITH ENDOBRONCHIAL NAVIGATION;  Surgeon: Garner Nash, DO;  Location: [place]  OR;  Service: Pulmonary;  Laterality: Left;  ? VIDEO BRONCHOSCOPY WITH ENDOBRONCHIAL ULTRASOUND Bilateral 05/20/2021  ? Procedure: VIDEO BRONCHOSCOPY WITH ENDOBRONCHIAL ULTRASOUND;  Surgeon: Garner Nash, DO;  Location: [place];  Service: Pulmonary;  Laterality: Bilateral;  ? WISDOM TOOTH EXTRACTION    ? ? ?REVIEW OF SYSTEMS:   ?Review of Systems  ?Constitutional: Negative for appetite change, chills, fatigue, fever and unexpected weight change.  ?HENT:   Negative for mouth sores, nosebleeds, sore throat and trouble swallowing.   ?Eyes: Negative for eye problems and icterus.  ?Respiratory: Negative for cough, hemoptysis, shortness of breath and wheezing.   ?Cardiovascular: Negative for chest pain and leg swelling.  ?Gastrointestinal: Negative for abdominal pain, constipation, diarrhea, nausea and vomiting.  ?Genitourinary: Negative for bladder incontinence, difficulty urinating, dysuria, frequency and hematuria.   ?Musculoskeletal: Negative for back pain, gait problem, neck pain and neck stiffness.  ?Skin: Negative for itching and rash.  ?Neurological: Negative for dizziness, extremity weakness, gait problem, headaches, light-headedness and seizures.  ?Hematological: Negative for adenopathy. Does  not bruise/bleed easily.  ?Psychiatric/Behavioral: Negative for confusion, depression and sleep disturbance. The patient is not nervous/anxious.   ? ? ?PHYSICAL EXAMINATION:  ?There were no vitals tak

## 2022-02-09 ENCOUNTER — Inpatient Hospital Stay: Payer: Medicare HMO

## 2022-02-09 ENCOUNTER — Telehealth: Payer: Self-pay | Admitting: Internal Medicine

## 2022-02-09 ENCOUNTER — Inpatient Hospital Stay: Payer: Medicare HMO | Admitting: Physician Assistant

## 2022-02-09 ENCOUNTER — Telehealth: Payer: Self-pay | Admitting: Physician Assistant

## 2022-02-09 NOTE — Telephone Encounter (Signed)
.  Called pt per 4/6 inbasket , Patient was unavailable, a message with appt time and date was left with number on file.   ?

## 2022-02-09 NOTE — Telephone Encounter (Signed)
I noticed that the patient has not arrived for her 10 AM lab appointment.  She is scheduled to see me today and for infusion.  I called to check on her to make sure she was on her way/to make sure she is okay.  Unable to reach her.  I left a voicemail instructing her to call us either way to let us know if she is unable to make her appointment today or she is on her way.  Callback number left. ?

## 2022-02-15 ENCOUNTER — Encounter: Payer: Self-pay | Admitting: *Deleted

## 2022-02-15 ENCOUNTER — Inpatient Hospital Stay: Payer: Medicare HMO | Attending: Internal Medicine

## 2022-02-15 ENCOUNTER — Other Ambulatory Visit: Payer: Self-pay

## 2022-02-15 ENCOUNTER — Inpatient Hospital Stay: Payer: Medicare HMO

## 2022-02-15 ENCOUNTER — Inpatient Hospital Stay (HOSPITAL_BASED_OUTPATIENT_CLINIC_OR_DEPARTMENT_OTHER): Payer: Medicare HMO | Admitting: Internal Medicine

## 2022-02-15 VITALS — BP 140/71 | HR 70 | Temp 96.5°F | Resp 19 | Ht 67.0 in | Wt 139.8 lb

## 2022-02-15 DIAGNOSIS — E785 Hyperlipidemia, unspecified: Secondary | ICD-10-CM | POA: Insufficient documentation

## 2022-02-15 DIAGNOSIS — R058 Other specified cough: Secondary | ICD-10-CM | POA: Diagnosis not present

## 2022-02-15 DIAGNOSIS — N281 Cyst of kidney, acquired: Secondary | ICD-10-CM | POA: Insufficient documentation

## 2022-02-15 DIAGNOSIS — Z9221 Personal history of antineoplastic chemotherapy: Secondary | ICD-10-CM | POA: Insufficient documentation

## 2022-02-15 DIAGNOSIS — C7931 Secondary malignant neoplasm of brain: Secondary | ICD-10-CM | POA: Insufficient documentation

## 2022-02-15 DIAGNOSIS — R0981 Nasal congestion: Secondary | ICD-10-CM | POA: Diagnosis not present

## 2022-02-15 DIAGNOSIS — Z888 Allergy status to other drugs, medicaments and biological substances status: Secondary | ICD-10-CM | POA: Insufficient documentation

## 2022-02-15 DIAGNOSIS — Z5112 Encounter for antineoplastic immunotherapy: Secondary | ICD-10-CM | POA: Diagnosis not present

## 2022-02-15 DIAGNOSIS — I700 Atherosclerosis of aorta: Secondary | ICD-10-CM | POA: Insufficient documentation

## 2022-02-15 DIAGNOSIS — Z880 Allergy status to penicillin: Secondary | ICD-10-CM | POA: Diagnosis not present

## 2022-02-15 DIAGNOSIS — E039 Hypothyroidism, unspecified: Secondary | ICD-10-CM | POA: Diagnosis not present

## 2022-02-15 DIAGNOSIS — F32A Depression, unspecified: Secondary | ICD-10-CM | POA: Diagnosis not present

## 2022-02-15 DIAGNOSIS — C3412 Malignant neoplasm of upper lobe, left bronchus or lung: Secondary | ICD-10-CM | POA: Diagnosis not present

## 2022-02-15 DIAGNOSIS — R067 Sneezing: Secondary | ICD-10-CM | POA: Insufficient documentation

## 2022-02-15 DIAGNOSIS — Z5111 Encounter for antineoplastic chemotherapy: Secondary | ICD-10-CM | POA: Diagnosis not present

## 2022-02-15 DIAGNOSIS — Z923 Personal history of irradiation: Secondary | ICD-10-CM | POA: Insufficient documentation

## 2022-02-15 DIAGNOSIS — Z8719 Personal history of other diseases of the digestive system: Secondary | ICD-10-CM | POA: Diagnosis not present

## 2022-02-15 DIAGNOSIS — Z882 Allergy status to sulfonamides status: Secondary | ICD-10-CM | POA: Insufficient documentation

## 2022-02-15 DIAGNOSIS — R0982 Postnasal drip: Secondary | ICD-10-CM | POA: Diagnosis not present

## 2022-02-15 DIAGNOSIS — Z87440 Personal history of urinary (tract) infections: Secondary | ICD-10-CM | POA: Diagnosis not present

## 2022-02-15 DIAGNOSIS — Z79899 Other long term (current) drug therapy: Secondary | ICD-10-CM | POA: Diagnosis not present

## 2022-02-15 DIAGNOSIS — I252 Old myocardial infarction: Secondary | ICD-10-CM | POA: Diagnosis not present

## 2022-02-15 LAB — CMP (CANCER CENTER ONLY)
ALT: 15 U/L (ref 0–44)
AST: 28 U/L (ref 15–41)
Albumin: 4.1 g/dL (ref 3.5–5.0)
Alkaline Phosphatase: 76 U/L (ref 38–126)
Anion gap: 7 (ref 5–15)
BUN: 14 mg/dL (ref 8–23)
CO2: 30 mmol/L (ref 22–32)
Calcium: 9.6 mg/dL (ref 8.9–10.3)
Chloride: 103 mmol/L (ref 98–111)
Creatinine: 0.95 mg/dL (ref 0.44–1.00)
GFR, Estimated: >60 mL/min (ref >60)
Glucose, Bld: 135 mg/dL — ABNORMAL HIGH (ref 70–99)
Potassium: 3.7 mmol/L (ref 3.5–5.1)
Sodium: 140 mmol/L (ref 135–145)
Total Bilirubin: 0.3 mg/dL (ref 0.3–1.2)
Total Protein: 7.5 g/dL (ref 6.5–8.1)

## 2022-02-15 LAB — CBC WITH DIFFERENTIAL (CANCER CENTER ONLY)
Abs Immature Granulocytes: 0.01 10*3/uL (ref 0.00–0.07)
Basophils Absolute: 0.1 10*3/uL (ref 0.0–0.1)
Basophils Relative: 1 %
Eosinophils Absolute: 0.2 10*3/uL (ref 0.0–0.5)
Eosinophils Relative: 4 %
HCT: 33.7 % — ABNORMAL LOW (ref 36.0–46.0)
Hemoglobin: 11.2 g/dL — ABNORMAL LOW (ref 12.0–15.0)
Immature Granulocytes: 0 %
Lymphocytes Relative: 21 %
Lymphs Abs: 1.1 10*3/uL (ref 0.7–4.0)
MCH: 35.8 pg — ABNORMAL HIGH (ref 26.0–34.0)
MCHC: 33.2 g/dL (ref 30.0–36.0)
MCV: 107.7 fL — ABNORMAL HIGH (ref 80.0–100.0)
Monocytes Absolute: 0.6 10*3/uL (ref 0.1–1.0)
Monocytes Relative: 11 %
Neutro Abs: 3.3 10*3/uL (ref 1.7–7.7)
Neutrophils Relative %: 63 %
Platelet Count: 205 10*3/uL (ref 150–400)
RBC: 3.13 MIL/uL — ABNORMAL LOW (ref 3.87–5.11)
RDW: 13.9 % (ref 11.5–15.5)
WBC Count: 5.2 10*3/uL (ref 4.0–10.5)
nRBC: 0 % (ref 0.0–0.2)

## 2022-02-15 MED ORDER — PROCHLORPERAZINE MALEATE 10 MG PO TABS
10.0000 mg | ORAL_TABLET | Freq: Once | ORAL | Status: AC
  Administered 2022-02-15: 10 mg via ORAL
  Filled 2022-02-15: qty 1

## 2022-02-15 MED ORDER — SODIUM CHLORIDE 0.9 % IV SOLN
500.0000 mg/m2 | Freq: Once | INTRAVENOUS | Status: AC
  Administered 2022-02-15: 900 mg via INTRAVENOUS
  Filled 2022-02-15: qty 20

## 2022-02-15 MED ORDER — SODIUM CHLORIDE 0.9 % IV SOLN
200.0000 mg | Freq: Once | INTRAVENOUS | Status: AC
  Administered 2022-02-15: 200 mg via INTRAVENOUS
  Filled 2022-02-15: qty 200

## 2022-02-15 MED ORDER — SODIUM CHLORIDE 0.9 % IV SOLN: Freq: Once | INTRAVENOUS | Status: AC

## 2022-02-15 NOTE — Progress Notes (Signed)
Oncology Nurse Navigator Documentation ? ? ?  02/15/2022  ?  8:00 AM  ?Oncology Nurse Navigator Flowsheets  ?Navigator Location [facility]  ?Navigator Encounter Type Clinic/MDC  ?Patient Visit Type MedOnc  ?Treatment Phase Treatment  ?Barriers/Navigation Needs Education/I spoke with Ms. Standifer today at clinic. She is doing well without complaints. I helped to explain plan of care.   ?Education Other  ?Interventions Education;Psycho-Social Support  ?Time Spent with Patient 15  ?  ?

## 2022-02-15 NOTE — Progress Notes (Signed)
?    [facility] ?Telephone:([phone]) [phone]   [phone] ? ?OFFICE PROGRESS NOTE ? ?[name], MD ?[address] ?[place] [address] ? ?DIAGNOSIS: Stage IV (T2 a, N2, M1b) non-small cell lung cancer, adenocarcinoma presented with left upper lobe lung mass in addition to left hilar and precarinal metastatic adenopathy and small left lower lobe pulmonary nodule in addition to solitary left frontal brain metastasis diagnosed in July 2022. ? ?Molecular studies by [employer] showed  ?Positive KRAS G12C mutation ?PD-L1 expression was less than 1% ? ? ?PRIOR THERAPY:  ?1) Status post SRS to the solitary brain metastasis. ?20 Treatment for the locally advanced disease in the chest with carboplatin for AUC of 2 and paclitaxel 45 Mg/M2.  Status post 6 cycles.  Last dose was given 07/25/2021. ? ?CURRENT THERAPY: Systemic chemotherapy with carboplatin for AUC of 5, Alimta 500 Mg/M2 and Keytruda 200 Mg IV every 3 weeks.  First dose August 31, 2021.  Status post 7 cycles.  Starting from cycle #5 the patient will be on maintenance treatment with Alimta and Keytruda every 3 weeks. ? ?INTERVAL HISTORY: ?Jill [name] 71 y.o. female returns to the clinic today for follow-up visit.  The patient is feeling fine today with no concerning complaints except for sneezing and nasal congestion as well as postnasal drainage.  She has cough productive for sputum but no significant chest pain, shortness of breath or hemoptysis.  She has no nausea, vomiting, diarrhea or constipation.  She has no headache or visual changes.  She has no significant weight loss or night sweats.  She is here today for evaluation before starting cycle #8. ? ? ?MEDICAL HISTORY: ?Past Medical History:  ?Diagnosis Date  ? Anxiety   ? ON PAXIL, XANAX  ? AVM (arteriovenous malformation) brain   ? s/p stent/coil  ? Blood transfusion   ? x 2  ? Cancer [facility])   ? Left lung  ? COPD (chronic obstructive pulmonary disease) ([place])   ? no  inhaler, no oxygen  ? Coronary artery disease   ? 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 Depression   ? Dizziness   ? Dyspnea   ? with exertion, no oxygen  ? Fatigue   ? Foot injury 06/01/2017  ? right - RESOLVED, no longer an issue per patient 05/18/21  ? GERD (gastroesophageal reflux disease)   ? Headache(784.0)   ? UNRUPTURED CEREBRAL ANEURYSM  ? Hyperlipidemia   ? Hypertension   ? Hypothyroidism   ? Infected cyst of skin 09/18/2013  ? Left knee pain 06/05/2012  ? Lung mass   ? Left lung  ? Myocardial infarction [facility])   ? Normal nuclear stress test Ju;y 2012  ? No ischemia. EF 70%; fixed defect involving septum, inferoseptal and inferior wall  ? Pain, dental 02/06/2017  ? Poor dental hygiene   ? Retroperitoneal bleeding   ? Following cardiac cath  ? Tobacco abuse   ? Urine discoloration 09/18/2013  ? UTI (lower urinary tract infection) 10/16/2013  ? ? ?ALLERGIES:  is allergic to varenicline, ace inhibitors, prednisone, penicillins, and sulfa drugs cross reactors. ? ?MEDICATIONS:  ?Current Outpatient Medications  ?Medication Sig Dispense Refill  ? acetaminophen (TYLENOL) 500 MG tablet Take 500 mg by mouth every 6 (six) hours as needed.    ? amLODipine (NORVASC) 10 MG tablet Take 1 tablet by mouth once daily 90 tablet 1  ? amoxicillin-clavulanate (AUGMENTIN) 875-125 MG tablet Take  1 tablet by mouth 2 (two) times daily. (Patient not taking: Reported on 10/26/2021) 12 tablet 0  ? aspirin EC 81 MG tablet Take 1 tablet (81 mg total) by mouth daily. 90 tablet 2  ? atorvastatin (LIPITOR) 40 MG tablet Take 1 tablet by mouth once daily 90 tablet 1  ? Cholecalciferol (VITAMIN D3) 250 MCG (10000 UT) capsule Take 10,000 mcg by mouth daily.    ? escitalopram (LEXAPRO) 10 MG tablet Take 1 tablet by mouth once daily 90 tablet 1  ? folic acid (FOLVITE) 1 MG tablet Take 1 tablet by mouth once daily 30 tablet 0  ? levothyroxine (SYNTHROID) 137 MCG tablet Take 1 tablet (137 mcg total) by mouth daily before  breakfast. 30 tablet 1  ? losartan (COZAAR) 100 MG tablet Take 1 tablet by mouth once daily 90 tablet 1  ? metoprolol succinate (TOPROL-XL) 50 MG 24 hr tablet TAKE 1 TABLET BY MOUTH ONCE DAILY WITH MEALS OR  IMMEDIATELY  FOLLOWING 90 tablet 1  ? Multiple Vitamins-Minerals (MULTIVITAMIN WITH MINERALS) tablet Take 1 tablet by mouth daily.    ? nitroGLYCERIN (NITROSTAT) 0.4 MG SL tablet Place 1 tablet (0.4 mg total) under the tongue every 5 (five) minutes as needed. For chest pain (Patient not taking: Reported on 10/26/2021) 25 tablet 6  ? prochlorperazine (COMPAZINE) 10 MG tablet Take 1 tablet (10 mg total) by mouth every 6 (six) hours as needed for nausea or vomiting. (Patient not taking: Reported on 10/26/2021) 30 tablet 0  ? Vitamin A 2400 MCG (8000 UT) TABS Take 2,400 mg by mouth daily.    ? vitamin B-12 (CYANOCOBALAMIN) 1000 MCG tablet Take 1,000 mcg by mouth daily. (Patient not taking: Reported on 10/26/2021)    ? ?No current facility-administered medications for this visit.  ? ? ?SURGICAL HISTORY:  ?Past Surgical History:  ?Procedure Laterality Date  ? CARDIAC CATHETERIZATION  01/02/2007  ? IT REVEALS MILD INFERIOR WALL HYPOKINESIS. THE EJECTION FRACTION IS AROUND 50%  ? COLONOSCOPY    ? CORONARY ARTERY BYPASS GRAFT  11/06/2006  ? LIMA to LAD, SVG to DX, SVG to LCX & SVG to OM 1 & 2, and SVG to PD  ? CORONARY STENT PLACEMENT    ? Remote past stent to RCA  ? EYE SURGERY Right   ? cataracts removed  ? HEMORRHOID SURGERY  11/07/1987  ? UPPER GI ENDOSCOPY    ? VENTRICULOSTOMY  10/06/2011  ? Procedure: VENTRICULOSTOMY;  Surgeon: Winfield Cunas;  Location: [place] [facility];  Service: Neurosurgery;  Laterality: Right;  Insertion of Ventriculostomy Catheter  ? VIDEO BRONCHOSCOPY WITH ENDOBRONCHIAL NAVIGATION Left 05/20/2021  ? Procedure: VIDEO BRONCHOSCOPY WITH ENDOBRONCHIAL NAVIGATION;  Surgeon: Garner Nash, DO;  Location: [place];  Service: Pulmonary;  Laterality: Left;  ? VIDEO BRONCHOSCOPY WITH ENDOBRONCHIAL  ULTRASOUND Bilateral 05/20/2021  ? Procedure: VIDEO BRONCHOSCOPY WITH ENDOBRONCHIAL ULTRASOUND;  Surgeon: Garner Nash, DO;  Location: [facility];  Service: Pulmonary;  Laterality: Bilateral;  ? WISDOM TOOTH EXTRACTION    ? ? ?REVIEW OF SYSTEMS:  A comprehensive review of systems was negative except for: Ears, nose, mouth, throat, and face: positive for nasal congestion  ? ?PHYSICAL EXAMINATION: General appearance: alert, cooperative, and no distress ?Head: Normocephalic, without obvious abnormality, atraumatic ?Neck: no adenopathy, no JVD, supple, symmetrical, trachea midline, and thyroid not enlarged, symmetric, no tenderness/mass/nodules ?Lymph nodes: Cervical, supraclavicular, and axillary nodes normal. ?Resp: clear to auscultation bilaterally ?Back: symmetric, no curvature. ROM normal. No CVA tenderness. ?Cardio: regular rate and rhythm,  S1, S2 normal, no murmur, click, rub or gallop ?GI: soft, non-tender; bowel sounds normal; no masses,  no organomegaly ?Extremities: extremities normal, atraumatic, no cyanosis or edema ? ?ECOG PERFORMANCE STATUS: 1 - Symptomatic but completely ambulatory ? ?Blood pressure 140/71, pulse 70, temperature (!) 96.5 ?F (35.8 ?C), temperature source Tympanic, resp. rate 19, height _0  (1.702 m), weight 139 lb 12.8 oz (63.4 kg), SpO2 94 %. ? ?LABORATORY DATA: ?Lab Results  ?Component Value Date  ? WBC 5.2 02/15/2022  ? HGB 11.2 (L) 02/15/2022  ? HCT 33.7 (L) 02/15/2022  ? MCV 107.7 (H) 02/15/2022  ? PLT 205 02/15/2022  ? ? ?  Chemistry   ?   ?Component Value Date/Time  ? NA 140 01/19/2022 0858  ? NA 141 04/12/2021 1356  ? K 3.7 01/19/2022 0858  ? CL 105 01/19/2022 0858  ? CO2 30 01/19/2022 0858  ? BUN 8 01/19/2022 0858  ? BUN 13 04/12/2021 1356  ? CREATININE 0.94 01/19/2022 0858  ? CREATININE 0.99 08/29/2016 1130  ?    ?Component Value Date/Time  ? CALCIUM 9.4 01/19/2022 0858  ? ALKPHOS 79 01/19/2022 0858  ? AST 28 01/19/2022 0858  ? ALT 17 01/19/2022 0858  ? BILITOT 0.3 01/19/2022  0858  ?  ? ? ? ?RADIOGRAPHIC STUDIES: ?CT Chest W Contrast ? ?Result Date: 01/17/2022 ?CLINICAL DATA:  Primary Cancer Type: Lung Imaging Indication: Assess response to therapy Interval therapy since last imaging? Yes In

## 2022-02-15 NOTE — Patient Instructions (Signed)
[facility]  Discharge Instructions: ?Thank you for choosing [facility] to provide your oncology and hematology care.  ? ?If you have a lab appointment with the [facility], please go directly to the [facility] and check in at the registration area. ?  ?Wear comfortable clothing and clothing appropriate for easy access to any Portacath or PICC line.  ? ?We strive to give you quality time with your provider. You may need to reschedule your appointment if you arrive late (15 or more minutes).  Arriving late affects you and other patients whose appointments are after yours.  Also, if you miss three or more appointments without notifying the office, you may be dismissed from the clinic at the provider?s discretion.    ?  ?For prescription refill requests, have your pharmacy contact our office and allow 72 hours for refills to be completed.   ? ?Today you received the following chemotherapy and/or immunotherapy agents Pemetrexed, Keytruda.    ?  ?To help prevent nausea and vomiting after your treatment, we encourage you to take your nausea medication as directed. ? ?BELOW ARE SYMPTOMS THAT SHOULD BE REPORTED IMMEDIATELY: ?*FEVER GREATER THAN 100.4 F (38 ?C) OR HIGHER ?*CHILLS OR SWEATING ?*NAUSEA AND VOMITING THAT IS NOT CONTROLLED WITH YOUR NAUSEA MEDICATION ?*UNUSUAL SHORTNESS OF BREATH ?*UNUSUAL BRUISING OR BLEEDING ?*URINARY PROBLEMS (pain or burning when urinating, or frequent urination) ?*BOWEL PROBLEMS (unusual diarrhea, constipation, pain near the anus) ?TENDERNESS IN MOUTH AND THROAT WITH OR WITHOUT PRESENCE OF ULCERS (sore throat, sores in mouth, or a toothache) ?UNUSUAL RASH, SWELLING OR PAIN  ?UNUSUAL VAGINAL DISCHARGE OR ITCHING  ? ?Items with * indicate a potential emergency and should be followed up as soon as possible or go to the Emergency Department if any problems should occur. ? ?Please show the CHEMOTHERAPY ALERT CARD or IMMUNOTHERAPY ALERT CARD at  check-in to the Emergency Department and triage nurse. ? ?Should you have questions after your visit or need to cancel or reschedule your appointment, please contact [facility]  Dept: [phone]  and follow the prompts.  Office hours are 8:00 a.m. to 4:30 p.m. Monday - Friday. Please note that voicemails left after 4:00 p.m. may not be returned until the following business day.  We are closed weekends and major holidays. You have access to a nurse at all times for urgent questions. Please call the main number to the clinic Dept: [phone] and follow the prompts. ? ? ?For any non-urgent questions, you may also contact your provider using MyChart. We now offer e-Visits for anyone 16 and older to request care online for non-urgent symptoms. For details visit [email or web address]. ?  ?Also download the MyChart app! Go to the app store, search "MyChart", open the app, select [facility], and log in with your MyChart username and password. ? ?Due to Covid, a mask is required upon entering the hospital/clinic. If you do not have a mask, one will be given to you upon arrival. For doctor visits, patients may have 1 support Deashia Soule aged 10 or older with them. For treatment visits, patients cannot have anyone with them due to current Covid guidelines and our immunocompromised population.  ? ?

## 2022-02-16 ENCOUNTER — Other Ambulatory Visit: Payer: Medicare HMO

## 2022-02-20 ENCOUNTER — Other Ambulatory Visit: Payer: Self-pay | Admitting: Internal Medicine

## 2022-02-28 ENCOUNTER — Other Ambulatory Visit: Payer: Self-pay

## 2022-02-28 DIAGNOSIS — E785 Hyperlipidemia, unspecified: Secondary | ICD-10-CM

## 2022-02-28 MED ORDER — ATORVASTATIN CALCIUM 40 MG PO TABS: 40.0000 mg | ORAL_TABLET | Freq: Every day | ORAL | 1 refills | Status: DC

## 2022-03-01 ENCOUNTER — Other Ambulatory Visit: Payer: Self-pay | Admitting: Family Medicine

## 2022-03-01 DIAGNOSIS — I10 Essential (primary) hypertension: Secondary | ICD-10-CM

## 2022-03-02 ENCOUNTER — Other Ambulatory Visit: Payer: Self-pay | Admitting: Family Medicine

## 2022-03-02 DIAGNOSIS — I10 Essential (primary) hypertension: Secondary | ICD-10-CM

## 2022-03-02 NOTE — Telephone Encounter (Signed)
Hello team, ? ?Please, reach out to this patient and help her schedule f/u with me for management of her chronic problems. She is also due for TSH check. Thanks. ?

## 2022-03-02 NOTE — Telephone Encounter (Signed)
Spoke with patient. Appt made for 5/23 at 9:50. Jill [name], CMA ? ?

## 2022-03-07 NOTE — Addendum Note (Signed)
Encounter addended by: Freeman Caldron, PA-C on: 03/07/2022 1:48 PM ? Actions taken: Clinical Note Signed

## 2022-03-08 ENCOUNTER — Inpatient Hospital Stay: Payer: Medicare HMO

## 2022-03-08 ENCOUNTER — Inpatient Hospital Stay (HOSPITAL_BASED_OUTPATIENT_CLINIC_OR_DEPARTMENT_OTHER): Payer: Medicare HMO | Admitting: Internal Medicine

## 2022-03-08 ENCOUNTER — Other Ambulatory Visit: Payer: Self-pay

## 2022-03-08 ENCOUNTER — Inpatient Hospital Stay: Payer: Medicare HMO | Attending: Internal Medicine

## 2022-03-08 VITALS — BP 134/79 | HR 71 | Temp 97.4°F | Resp 20 | Wt 142.0 lb

## 2022-03-08 DIAGNOSIS — R61 Generalized hyperhidrosis: Secondary | ICD-10-CM | POA: Insufficient documentation

## 2022-03-08 DIAGNOSIS — C778 Secondary and unspecified malignant neoplasm of lymph nodes of multiple regions: Secondary | ICD-10-CM | POA: Diagnosis not present

## 2022-03-08 DIAGNOSIS — E039 Hypothyroidism, unspecified: Secondary | ICD-10-CM | POA: Insufficient documentation

## 2022-03-08 DIAGNOSIS — I252 Old myocardial infarction: Secondary | ICD-10-CM | POA: Diagnosis not present

## 2022-03-08 DIAGNOSIS — Z923 Personal history of irradiation: Secondary | ICD-10-CM | POA: Diagnosis not present

## 2022-03-08 DIAGNOSIS — Z8719 Personal history of other diseases of the digestive system: Secondary | ICD-10-CM | POA: Diagnosis not present

## 2022-03-08 DIAGNOSIS — E785 Hyperlipidemia, unspecified: Secondary | ICD-10-CM | POA: Insufficient documentation

## 2022-03-08 DIAGNOSIS — C7931 Secondary malignant neoplasm of brain: Secondary | ICD-10-CM | POA: Diagnosis not present

## 2022-03-08 DIAGNOSIS — Z888 Allergy status to other drugs, medicaments and biological substances status: Secondary | ICD-10-CM | POA: Insufficient documentation

## 2022-03-08 DIAGNOSIS — Z882 Allergy status to sulfonamides status: Secondary | ICD-10-CM | POA: Insufficient documentation

## 2022-03-08 DIAGNOSIS — C3412 Malignant neoplasm of upper lobe, left bronchus or lung: Secondary | ICD-10-CM | POA: Diagnosis not present

## 2022-03-08 DIAGNOSIS — Z880 Allergy status to penicillin: Secondary | ICD-10-CM | POA: Diagnosis not present

## 2022-03-08 DIAGNOSIS — Z5112 Encounter for antineoplastic immunotherapy: Secondary | ICD-10-CM | POA: Diagnosis not present

## 2022-03-08 DIAGNOSIS — Z87440 Personal history of urinary (tract) infections: Secondary | ICD-10-CM | POA: Insufficient documentation

## 2022-03-08 DIAGNOSIS — Z5111 Encounter for antineoplastic chemotherapy: Secondary | ICD-10-CM | POA: Insufficient documentation

## 2022-03-08 DIAGNOSIS — Z79899 Other long term (current) drug therapy: Secondary | ICD-10-CM | POA: Diagnosis not present

## 2022-03-08 DIAGNOSIS — J449 Chronic obstructive pulmonary disease, unspecified: Secondary | ICD-10-CM | POA: Insufficient documentation

## 2022-03-08 LAB — CBC WITH DIFFERENTIAL (CANCER CENTER ONLY)
Abs Immature Granulocytes: 0.01 10*3/uL (ref 0.00–0.07)
Basophils Absolute: 0.1 10*3/uL (ref 0.0–0.1)
Basophils Relative: 1 %
Eosinophils Absolute: 0.3 10*3/uL (ref 0.0–0.5)
Eosinophils Relative: 4 %
HCT: 34.2 % — ABNORMAL LOW (ref 36.0–46.0)
Hemoglobin: 11.5 g/dL — ABNORMAL LOW (ref 12.0–15.0)
Immature Granulocytes: 0 %
Lymphocytes Relative: 23 %
Lymphs Abs: 1.4 10*3/uL (ref 0.7–4.0)
MCH: 36.1 pg — ABNORMAL HIGH (ref 26.0–34.0)
MCHC: 33.6 g/dL (ref 30.0–36.0)
MCV: 107.2 fL — ABNORMAL HIGH (ref 80.0–100.0)
Monocytes Absolute: 0.7 10*3/uL (ref 0.1–1.0)
Monocytes Relative: 12 %
Neutro Abs: 3.6 10*3/uL (ref 1.7–7.7)
Neutrophils Relative %: 60 %
Platelet Count: 304 10*3/uL (ref 150–400)
RBC: 3.19 MIL/uL — ABNORMAL LOW (ref 3.87–5.11)
RDW: 13.4 % (ref 11.5–15.5)
WBC Count: 6 10*3/uL (ref 4.0–10.5)
nRBC: 0 % (ref 0.0–0.2)

## 2022-03-08 LAB — CMP (CANCER CENTER ONLY)
ALT: 19 U/L (ref 0–44)
AST: 29 U/L (ref 15–41)
Albumin: 4.2 g/dL (ref 3.5–5.0)
Alkaline Phosphatase: 86 U/L (ref 38–126)
Anion gap: 7 (ref 5–15)
BUN: 10 mg/dL (ref 8–23)
CO2: 30 mmol/L (ref 22–32)
Calcium: 9.5 mg/dL (ref 8.9–10.3)
Chloride: 104 mmol/L (ref 98–111)
Creatinine: 1.02 mg/dL — ABNORMAL HIGH (ref 0.44–1.00)
GFR, Estimated: 58 mL/min — ABNORMAL LOW (ref >60)
Glucose, Bld: 106 mg/dL — ABNORMAL HIGH (ref 70–99)
Potassium: 3.6 mmol/L (ref 3.5–5.1)
Sodium: 141 mmol/L (ref 135–145)
Total Bilirubin: 0.3 mg/dL (ref 0.3–1.2)
Total Protein: 7.6 g/dL (ref 6.5–8.1)

## 2022-03-08 MED ORDER — SODIUM CHLORIDE 0.9 % IV SOLN
200.0000 mg | Freq: Once | INTRAVENOUS | Status: AC
  Administered 2022-03-08: 200 mg via INTRAVENOUS
  Filled 2022-03-08: qty 200

## 2022-03-08 MED ORDER — PROCHLORPERAZINE MALEATE 10 MG PO TABS
10.0000 mg | ORAL_TABLET | Freq: Once | ORAL | Status: AC
  Administered 2022-03-08: 10 mg via ORAL
  Filled 2022-03-08: qty 1

## 2022-03-08 MED ORDER — SODIUM CHLORIDE 0.9 % IV SOLN: Freq: Once | INTRAVENOUS | Status: AC

## 2022-03-08 MED ORDER — CYANOCOBALAMIN 1000 MCG/ML IJ SOLN
1000.0000 ug | Freq: Once | INTRAMUSCULAR | Status: AC
  Administered 2022-03-08: 1000 ug via INTRAMUSCULAR
  Filled 2022-03-08: qty 1

## 2022-03-08 MED ORDER — SODIUM CHLORIDE 0.9 % IV SOLN
500.0000 mg/m2 | Freq: Once | INTRAVENOUS | Status: AC
  Administered 2022-03-08: 900 mg via INTRAVENOUS
  Filled 2022-03-08: qty 20

## 2022-03-08 NOTE — Patient Instructions (Signed)
[facility]  ? Discharge Instructions: ?Thank you for choosing [facility] to provide your oncology and hematology care.  ? ?If you have a lab appointment with the [facility], please go directly to the [facility] and check in at the registration area. ?  ?Wear comfortable clothing and clothing appropriate for easy access to any Portacath or PICC line.  ? ?We strive to give you quality time with your provider. You may need to reschedule your appointment if you arrive late (15 or more minutes).  Arriving late affects you and other patients whose appointments are after yours.  Also, if you miss three or more appointments without notifying the office, you may be dismissed from the clinic at the provider?s discretion.    ?  ?For prescription refill requests, have your pharmacy contact our office and allow 72 hours for refills to be completed.   ? ?Today you received the following chemotherapy and/or immunotherapy agents: pembrolizumab, pemetrexed    ?  ?To help prevent nausea and vomiting after your treatment, we encourage you to take your nausea medication as directed. ? ?BELOW ARE SYMPTOMS THAT SHOULD BE REPORTED IMMEDIATELY: ?*FEVER GREATER THAN 100.4 F (38 ?C) OR HIGHER ?*CHILLS OR SWEATING ?*NAUSEA AND VOMITING THAT IS NOT CONTROLLED WITH YOUR NAUSEA MEDICATION ?*UNUSUAL SHORTNESS OF BREATH ?*UNUSUAL BRUISING OR BLEEDING ?*URINARY PROBLEMS (pain or burning when urinating, or frequent urination) ?*BOWEL PROBLEMS (unusual diarrhea, constipation, pain near the anus) ?TENDERNESS IN MOUTH AND THROAT WITH OR WITHOUT PRESENCE OF ULCERS (sore throat, sores in mouth, or a toothache) ?UNUSUAL RASH, SWELLING OR PAIN  ?UNUSUAL VAGINAL DISCHARGE OR ITCHING  ? ?Items with * indicate a potential emergency and should be followed up as soon as possible or go to the Emergency Department if any problems should occur. ? ?Please show the CHEMOTHERAPY ALERT CARD or IMMUNOTHERAPY ALERT  CARD at check-in to the Emergency Department and triage nurse. ? ?Should you have questions after your visit or need to cancel or reschedule your appointment, please contact [facility]  Dept: [phone]  and follow the prompts.  Office hours are 8:00 a.m. to 4:30 p.m. Monday - Friday. Please note that voicemails left after 4:00 p.m. may not be returned until the following business day.  We are closed weekends and major holidays. You have access to a nurse at all times for urgent questions. Please call the main number to the clinic Dept: [phone] and follow the prompts. ? ? ?For any non-urgent questions, you may also contact your provider using MyChart. We now offer e-Visits for anyone 83 and older to request care online for non-urgent symptoms. For details visit [email or web address]. ?  ?Also download the MyChart app! Go to the app store, search "MyChart", open the app, select [facility], and log in with your MyChart username and password. ? ?Due to Covid, a mask is required upon entering the hospital/clinic. If you do not have a mask, one will be given to you upon arrival. For doctor visits, patients may have 1 support person aged 85 or older with them. For treatment visits, patients cannot have anyone with them due to current Covid guidelines and our immunocompromised population.  ? ?

## 2022-03-08 NOTE — Progress Notes (Signed)
?    [facility] ?[phone]   [phone] ? ?OFFICE PROGRESS NOTE ? ?[name], MD ?[address] ?[place] [address] ? ?DIAGNOSIS: Stage IV (T2 a, N2, M1b) non-small cell lung cancer, adenocarcinoma presented with left upper lobe lung mass in addition to left hilar and precarinal metastatic adenopathy and small left lower lobe pulmonary nodule in addition to solitary left frontal brain metastasis diagnosed in July 2022. ? ?Molecular studies by [employer] showed  ?Positive KRAS G12C mutation ?PD-L1 expression was less than 1% ? ? ?PRIOR THERAPY:  ?1) Status post SRS to the solitary brain metastasis. ?20 Treatment for the locally advanced disease in the chest with carboplatin for AUC of 2 and paclitaxel 45 Mg/M2.  Status post 6 cycles.  Last dose was given 07/25/2021. ? ?CURRENT THERAPY: Systemic chemotherapy with carboplatin for AUC of 5, Alimta 500 Mg/M2 and Keytruda 200 Mg IV every 3 weeks.  First dose August 31, 2021.  Status post 8 cycles.  Starting from cycle #5 the patient will be on maintenance treatment with Alimta and Keytruda every 3 weeks. ? ?INTERVAL HISTORY: ?Jill [name] 71 y.o. female returns to the clinic today for follow-up visit.  The patient is celebrating her 71rd birthday today.  She denied having any chest pain, shortness of breath, cough or hemoptysis.  She has no nausea, vomiting, diarrhea or constipation.  She has no headache or visual changes.  She denied having any weight loss or night sweats.  She has been tolerating her treatment with maintenance Alimta and Keytruda fairly well.  She is here for evaluation before starting cycle #9. ? ? ?MEDICAL HISTORY: ?Past Medical History:  ?Diagnosis Date  ? Anxiety   ? ON PAXIL, XANAX  ? AVM (arteriovenous malformation) brain   ? s/p stent/coil  ? Blood transfusion   ? x 2  ? Cancer [facility])   ? Left lung  ? COPD (chronic obstructive pulmonary disease) ([place] [facility] [place])   ? no inhaler, no oxygen  ?  Coronary artery disease   ? 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 Depression   ? Dizziness   ? Dyspnea   ? with exertion, no oxygen  ? Fatigue   ? Foot injury 06/01/2017  ? right - RESOLVED, no longer an issue per patient 05/18/21  ? GERD (gastroesophageal reflux disease)   ? Headache(784.0)   ? UNRUPTURED CEREBRAL ANEURYSM  ? Hyperlipidemia   ? Hypertension   ? Hypothyroidism   ? Infected cyst of skin 09/18/2013  ? Left knee pain 06/05/2012  ? Lung mass   ? Left lung  ? Myocardial infarction [facility])   ? Normal nuclear stress test Ju;y 2012  ? No ischemia. EF 70%; fixed defect involving septum, inferoseptal and inferior wall  ? Pain, dental 02/06/2017  ? Poor dental hygiene   ? Retroperitoneal bleeding   ? Following cardiac cath  ? Tobacco abuse   ? Urine discoloration 09/18/2013  ? UTI (lower urinary tract infection) 10/16/2013  ? ? ?ALLERGIES:  is allergic to varenicline, ace inhibitors, prednisone, penicillins, and sulfa drugs cross reactors. ? ?MEDICATIONS:  ?Current Outpatient Medications  ?Medication Sig Dispense Refill  ? acetaminophen (TYLENOL) 500 MG tablet Take 500 mg by mouth every 6 (six) hours as needed.    ? amLODipine (NORVASC) 10 MG tablet Take 1 tablet by mouth once daily 90 tablet 1  ? amoxicillin-clavulanate (AUGMENTIN) 875-125 MG tablet Take 1 tablet by mouth  2 (two) times daily. (Patient not taking: Reported on 10/26/2021) 12 tablet 0  ? aspirin EC 81 MG tablet Take 1 tablet (81 mg total) by mouth daily. 90 tablet 2  ? atorvastatin (LIPITOR) 40 MG tablet Take 1 tablet (40 mg total) by mouth daily. 90 tablet 1  ? Cholecalciferol (VITAMIN D3) 250 MCG (10000 UT) capsule Take 10,000 mcg by mouth daily.    ? escitalopram (LEXAPRO) 10 MG tablet Take 1 tablet by mouth once daily 90 tablet 1  ? folic acid (FOLVITE) 1 MG tablet Take 1 tablet by mouth once daily 30 tablet 0  ? levothyroxine (SYNTHROID) 137 MCG tablet Take 1 tablet (137 mcg total) by mouth daily before breakfast. 30 tablet  1  ? losartan (COZAAR) 100 MG tablet Take 1 tablet by mouth once daily 90 tablet 1  ? metoprolol succinate (TOPROL-XL) 50 MG 24 hr tablet TAKE 1 TABLET BY MOUTH ONCE DAILY WITH A MEAL OR  IMMEDIATELY  FOLLOWING 90 tablet 0  ? Multiple Vitamins-Minerals (MULTIVITAMIN WITH MINERALS) tablet Take 1 tablet by mouth daily.    ? nitroGLYCERIN (NITROSTAT) 0.4 MG SL tablet Place 1 tablet (0.4 mg total) under the tongue every 5 (five) minutes as needed. For chest pain (Patient not taking: Reported on 10/26/2021) 25 tablet 6  ? prochlorperazine (COMPAZINE) 10 MG tablet Take 1 tablet (10 mg total) by mouth every 6 (six) hours as needed for nausea or vomiting. (Patient not taking: Reported on 10/26/2021) 30 tablet 0  ? Vitamin A 2400 MCG (8000 UT) TABS Take 2,400 mg by mouth daily.    ? vitamin B-12 (CYANOCOBALAMIN) 1000 MCG tablet Take 1,000 mcg by mouth daily. (Patient not taking: Reported on 10/26/2021)    ? ?No current facility-administered medications for this visit.  ? ? ?SURGICAL HISTORY:  ?Past Surgical History:  ?Procedure Laterality Date  ? CARDIAC CATHETERIZATION  01/02/2007  ? IT REVEALS MILD INFERIOR WALL HYPOKINESIS. THE EJECTION FRACTION IS AROUND 50%  ? COLONOSCOPY    ? CORONARY ARTERY BYPASS GRAFT  11/06/2006  ? LIMA to LAD, SVG to DX, SVG to LCX & SVG to OM 1 & 2, and SVG to PD  ? CORONARY STENT PLACEMENT    ? Remote past stent to RCA  ? EYE SURGERY Right   ? cataracts removed  ? HEMORRHOID SURGERY  11/07/1987  ? UPPER GI ENDOSCOPY    ? VENTRICULOSTOMY  10/06/2011  ? Procedure: VENTRICULOSTOMY;  Surgeon: Winfield Cunas;  Location: [facility];  Service: Neurosurgery;  Laterality: Right;  Insertion of Ventriculostomy Catheter  ? VIDEO BRONCHOSCOPY WITH ENDOBRONCHIAL NAVIGATION Left 05/20/2021  ? Procedure: VIDEO BRONCHOSCOPY WITH ENDOBRONCHIAL NAVIGATION;  Surgeon: Garner Nash, DO;  Location: [facility];  Service: Pulmonary;  Laterality: Left;  ? VIDEO BRONCHOSCOPY WITH ENDOBRONCHIAL ULTRASOUND Bilateral  05/20/2021  ? Procedure: VIDEO BRONCHOSCOPY WITH ENDOBRONCHIAL ULTRASOUND;  Surgeon: Garner Nash, DO;  Location: [facility];  Service: Pulmonary;  Laterality: Bilateral;  ? WISDOM TOOTH EXTRACTION    ? ? ?REVIEW OF SYSTEMS:  A comprehensive review of systems was negative.  ? ?PHYSICAL EXAMINATION: General appearance: alert, cooperative, and no distress ?Head: Normocephalic, without obvious abnormality, atraumatic ?Neck: no adenopathy, no JVD, supple, symmetrical, trachea midline, and thyroid not enlarged, symmetric, no tenderness/mass/nodules ?Lymph nodes: Cervical, supraclavicular, and axillary nodes normal. ?Resp: clear to auscultation bilaterally ?Back: symmetric, no curvature. ROM normal. No CVA tenderness. ?Cardio: regular rate and rhythm, S1, S2 normal, no murmur, click, rub or gallop ?GI: soft, non-tender; bowel  sounds normal; no masses,  no organomegaly ?Extremities: extremities normal, atraumatic, no cyanosis or edema ? ?ECOG PERFORMANCE STATUS: 1 - Symptomatic but completely ambulatory ? ?Blood pressure 134/79, pulse 71, temperature (!) 97.4 ?F (36.3 ?C), temperature source Oral, resp. rate 20, weight 142 lb (64.4 kg), SpO2 95 %. ? ?LABORATORY DATA: ?Lab Results  ?Component Value Date  ? WBC 6.0 03/08/2022  ? HGB 11.5 (L) 03/08/2022  ? HCT 34.2 (L) 03/08/2022  ? MCV 107.2 (H) 03/08/2022  ? PLT 304 03/08/2022  ? ? ?  Chemistry   ?   ?Component Value Date/Time  ? NA 140 02/15/2022 0815  ? NA 141 04/12/2021 1356  ? K 3.7 02/15/2022 0815  ? CL 103 02/15/2022 0815  ? CO2 30 02/15/2022 0815  ? BUN 14 02/15/2022 0815  ? BUN 13 04/12/2021 1356  ? CREATININE 0.95 02/15/2022 0815  ? CREATININE 0.99 08/29/2016 1130  ?    ?Component Value Date/Time  ? CALCIUM 9.6 02/15/2022 0815  ? ALKPHOS 76 02/15/2022 0815  ? AST 28 02/15/2022 0815  ? ALT 15 02/15/2022 0815  ? BILITOT 0.3 02/15/2022 0815  ?  ? ? ? ?RADIOGRAPHIC STUDIES: ?No results found. ? ?ASSESSMENT AND PLAN: This is a very pleasant 71 years old white female  diagnosed with a stage IV (T2 a, N2, M1 B) non-small cell lung cancer, adenocarcinoma presented with left upper lobe lung mass in addition to left hilar and precarinal metastatic adenopathy as well as small left lower

## 2022-03-08 NOTE — Progress Notes (Signed)
Patient reports she took 50mg  Benadryl PO prior to arrival. ?

## 2022-03-27 ENCOUNTER — Encounter: Payer: Self-pay | Admitting: Family Medicine

## 2022-03-27 ENCOUNTER — Other Ambulatory Visit: Payer: Self-pay | Admitting: Internal Medicine

## 2022-03-28 ENCOUNTER — Ambulatory Visit: Payer: Medicare HMO | Admitting: Family Medicine

## 2022-03-28 NOTE — Progress Notes (Unsigned)
[facility] OFFICE PROGRESS NOTE  [name], MD [place] [address]  DIAGNOSIS:  Stage IV (T2 a, N2, M1b) non-small cell lung cancer, adenocarcinoma presented with left upper lobe lung mass in addition to left hilar and precarinal metastatic adenopathy and small left lower lobe pulmonary nodule in addition to solitary left frontal brain metastasis diagnosed in July 2022.   Molecular studies by [employer] showed  Positive KRAS G12C mutation PD-L1 expression was less than 1%  PRIOR THERAPY: 1) Status post SRS to the solitary brain metastasis under the care of Dr. Tammi Klippel. Completed on 06/23/21 2)  Treatment for the locally advanced disease in the chest with carboplatin for AUC of 2 and paclitaxel 45 Mg/M2.  Status post 6 cycles.  Last dose was given 07/25/2021. 3) SRS to the additional metastatic brain lesions on 09/28/22  CURRENT THERAPY: Systemic chemotherapy with carboplatin for AUC of 5, Alimta 500 Mg/M2 and Keytruda 200 Mg IV every 3 weeks.  First dose August 31, 2021. Status post 9 cycles. Starting from cycle #5, she will start Alimta and keytruda maintenance IV every 3 weeks.   INTERVAL HISTORY: Jill [name] 71 y.o. female returns to the clinic today for a follow up visit. She is feeling fairly well today without any concerning complaints. She is currently undergoing chemotherapy and immunotherapy. She is tolerating treatment fairly well except for fatigue. She denies fevers or chills. She reports she has night sweats due to a warm sleeping environment. She reports fluctuating appetite. She stopped taking supplemental drinks but admits she may need to resume taking this. Her weight is stable. She tries to drink ensure. Denies leg swelling or chest pain. Denies speech changes or extremity weakness. No recurrent symptoms since this time. She has a history of metastatic disease to the brain. She is scheduled for a brain MRI 04/06/22. She reports  some dyspnea on exertion with certain activities such as climbing the stairs. She denies significant cough, hemoptysis, or chest pain. Denies nausea, vomiting, diarrhea, or constipation. She denies rashes or skin changes. She is seeing her PCP who is monitoring her hypothyroidism. She is rechecking her labs in a few weeks.  She is here today for evaluation and repeat blood work before starting cycle #10.     MEDICAL HISTORY: Past Medical History:  Diagnosis Date   Anxiety    ON PAXIL, XANAX   AVM (arteriovenous malformation) brain    s/p stent/coil   Blood transfusion    x 2   Cancer ([facility])    Left lung   COPD (chronic obstructive pulmonary disease) ([facility])    no inhaler, no oxygen   Coronary artery disease    Prior inferior MI with stent to RCA, s/p CABG in 2008   Depression    Dizziness    Dyspnea    with exertion, no oxygen   Fatigue    Foot injury 06/01/2017   right - RESOLVED, no longer an issue per patient 05/18/21   GERD (gastroesophageal reflux disease)    Headache(784.0)    UNRUPTURED CEREBRAL ANEURYSM   Hyperlipidemia    Hypertension    Hypothyroidism    Infected cyst of skin 09/18/2013   Left knee pain 06/05/2012   Lung mass    Left lung   Myocardial infarction ([facility])    Normal nuclear stress test Ju;y 2012   No ischemia. EF 70%; fixed defect involving septum, inferoseptal and inferior wall   Pain, dental 02/06/2017  Poor dental hygiene    Retroperitoneal bleeding    Following cardiac cath   Tobacco abuse    Urine discoloration 09/18/2013   Urine incontinence 07/06/2020   UTI (lower urinary tract infection) 10/16/2013    ALLERGIES:  is allergic to varenicline, ace inhibitors, prednisone, betalin 12 [vitamin b12], penicillins, and sulfa drugs cross reactors.  MEDICATIONS:  Current Outpatient Medications  Medication Sig Dispense Refill   acetaminophen (TYLENOL) 500 MG tablet Take 500 mg by mouth every 6 (six) hours as needed.     amLODipine (NORVASC) 10 MG  tablet Take 1 tablet by mouth once daily 90 tablet 1   amoxicillin-clavulanate (AUGMENTIN) 875-125 MG tablet Take 1 tablet by mouth 2 (two) times daily. (Patient not taking: Reported on 10/26/2021) 12 tablet 0   aspirin EC 81 MG tablet Take 1 tablet (81 mg total) by mouth daily. 90 tablet 2   atorvastatin (LIPITOR) 40 MG tablet Take 1 tablet (40 mg total) by mouth daily. 90 tablet 1   Cholecalciferol (VITAMIN D3) 250 MCG (10000 UT) capsule Take 10,000 mcg by mouth daily.     escitalopram (LEXAPRO) 10 MG tablet Take 1 tablet by mouth once daily 90 tablet 1   folic acid (FOLVITE) 1 MG tablet Take 1 tablet by mouth once daily 30 tablet 0   levothyroxine (SYNTHROID) 137 MCG tablet Take 1 tablet (137 mcg total) by mouth daily before breakfast. 30 tablet 1   losartan (COZAAR) 100 MG tablet Take 1 tablet by mouth once daily 90 tablet 1   metoprolol succinate (TOPROL-XL) 50 MG 24 hr tablet TAKE 1 TABLET BY MOUTH ONCE DAILY WITH A MEAL OR  IMMEDIATELY  FOLLOWING 90 tablet 0   Multiple Vitamins-Minerals (MULTIVITAMIN WITH MINERALS) tablet Take 1 tablet by mouth daily.     nitroGLYCERIN (NITROSTAT) 0.4 MG SL tablet Place 1 tablet (0.4 mg total) under the tongue every 5 (five) minutes as needed. For chest pain (Patient not taking: Reported on 10/26/2021) 25 tablet 6   prochlorperazine (COMPAZINE) 10 MG tablet Take 1 tablet (10 mg total) by mouth every 6 (six) hours as needed for nausea or vomiting. (Patient not taking: Reported on 10/26/2021) 30 tablet 0   Vitamin A 2400 MCG (8000 UT) TABS Take 2,400 mg by mouth daily.     vitamin B-12 (CYANOCOBALAMIN) 1000 MCG tablet Take 1,000 mcg by mouth daily. (Patient not taking: Reported on 10/26/2021)     No current facility-administered medications for this visit.    SURGICAL HISTORY:  Past Surgical History:  Procedure Laterality Date   CARDIAC CATHETERIZATION  01/02/2007   IT REVEALS MILD INFERIOR WALL HYPOKINESIS. THE EJECTION FRACTION IS AROUND 50%    COLONOSCOPY     CORONARY ARTERY BYPASS GRAFT  11/06/2006   LIMA to LAD, SVG to DX, SVG to LCX & SVG to OM 1 & 2, and SVG to PD   CORONARY STENT PLACEMENT     Remote past stent to RCA   EYE SURGERY Right    cataracts removed   HEMORRHOID SURGERY  11/07/1987   UPPER GI ENDOSCOPY     VENTRICULOSTOMY  10/06/2011   Procedure: VENTRICULOSTOMY;  Surgeon: Winfield Cunas;  Location: [facility];  Service: Neurosurgery;  Laterality: Right;  Insertion of Ventriculostomy Catheter   VIDEO BRONCHOSCOPY WITH ENDOBRONCHIAL NAVIGATION Left 05/20/2021   Procedure: VIDEO BRONCHOSCOPY WITH ENDOBRONCHIAL NAVIGATION;  Surgeon: Garner Nash, DO;  Location: [facility];  Service: Pulmonary;  Laterality: Left;   VIDEO BRONCHOSCOPY WITH ENDOBRONCHIAL ULTRASOUND Bilateral  05/20/2021   Procedure: VIDEO BRONCHOSCOPY WITH ENDOBRONCHIAL ULTRASOUND;  Surgeon: Garner Nash, DO;  Location: [facility];  Service: Pulmonary;  Laterality: Bilateral;   WISDOM TOOTH EXTRACTION      REVIEW OF SYSTEMS:   Review of Systems  Constitutional: Negative for appetite change, chills, fatigue, fever and unexpected weight change.  HENT:   Negative for mouth sores, nosebleeds, sore throat and trouble swallowing.   Eyes: Negative for eye problems and icterus.  Respiratory: Negative for cough, hemoptysis, shortness of breath and wheezing.   Cardiovascular: Negative for chest pain and leg swelling.  Gastrointestinal: Negative for abdominal pain, constipation, diarrhea, nausea and vomiting.  Genitourinary: Negative for bladder incontinence, difficulty urinating, dysuria, frequency and hematuria.   Musculoskeletal: Negative for back pain, gait problem, neck pain and neck stiffness.  Skin: Negative for itching and rash.  Neurological: Negative for dizziness, extremity weakness, gait problem, headaches, light-headedness and seizures.  Hematological: Negative for adenopathy. Does not bruise/bleed easily.  Psychiatric/Behavioral: Negative for  confusion, depression and sleep disturbance. The patient is not nervous/anxious.     PHYSICAL EXAMINATION:  There were no vitals taken for this visit.  ECOG PERFORMANCE STATUS: {CHL ONC ECOG [id number]  Physical Exam  Constitutional: Oriented to person, place, and time and well-developed, well-nourished, and in no distress. No distress.  HENT:  Head: Normocephalic and atraumatic.  Mouth/Throat: Oropharynx is clear and moist. No oropharyngeal exudate.  Eyes: Conjunctivae are normal. Right eye exhibits no discharge. Left eye exhibits no discharge. No scleral icterus.  Neck: Normal range of motion. Neck supple.  Cardiovascular: Normal rate, regular rhythm, normal heart sounds and intact distal pulses.   Pulmonary/Chest: Effort normal and breath sounds normal. No respiratory distress. No wheezes. No rales.  Abdominal: Soft. Bowel sounds are normal. Exhibits no distension and no mass. There is no tenderness.  Musculoskeletal: Normal range of motion. Exhibits no edema.  Lymphadenopathy:    No cervical adenopathy.  Neurological: Alert and oriented to person, place, and time. Exhibits normal muscle tone. Gait normal. Coordination normal.  Skin: Skin is warm and dry. No rash noted. Not diaphoretic. No erythema. No pallor.  Psychiatric: Mood, memory and judgment normal.  Vitals reviewed.  LABORATORY DATA: Lab Results  Component Value Date   WBC 6.0 03/08/2022   HGB 11.5 (L) 03/08/2022   HCT 34.2 (L) 03/08/2022   MCV 107.2 (H) 03/08/2022   PLT 304 03/08/2022      Chemistry      Component Value Date/Time   NA 141 03/08/2022 0754   NA 141 04/12/2021 1356   K 3.6 03/08/2022 0754   CL 104 03/08/2022 0754   CO2 30 03/08/2022 0754   BUN 10 03/08/2022 0754   BUN 13 04/12/2021 1356   CREATININE 1.02 (H) 03/08/2022 0754   CREATININE 0.99 08/29/2016 1130      Component Value Date/Time   CALCIUM 9.5 03/08/2022 0754   ALKPHOS 86 03/08/2022 0754   AST 29 03/08/2022 0754   ALT 19  03/08/2022 0754   BILITOT 0.3 03/08/2022 0754       RADIOGRAPHIC STUDIES:  No results found.   ASSESSMENT/PLAN:  This is a very pleasant 71 year old Caucasian female diagnosed with stage IV (T2 a, N2, M1 B) non-small cell lung cancer, adenocarcinoma.  She presented with a left upper lobe lung mass in addition to left hilar and precarinal metastatic adenopathy as well as a small left lower lobe pulmonary nodule and a solitary left frontal brain metastasis.  This was diagnosed in July 2022.  Her molecular studies show that she is positive for K-ras G12C mutation and her PD-L1 expression is negative.  She completed SRS to the solitary brain metastasis under the care of Dr. Tammi Klippel which was completed on 06/23/2021.  She had a repeat brain MRI performed on 09/15/2021 which shows a new 1 mm and 3 mm metastatic brain lesions.  She received SRS on 09/28/2021.  She completed concurrent chemoradiation for the locally advanced disease in the chest with carboplatin for an AUC of 2 and paclitaxel 45 mg per metered squared.  She is status post 6 cycles.  She had been tolerating treatment well without any concerning adverse side effects.    The patient is currently undergoing systemic chemotherapy with carboplatin AUC 5, Alimta 500 mg per metered squared, Keytruda 20 mg IV every 3 weeks.  She is status post 9 cycles.  She started maintenance alimta and Keytruda starting from cycle #5.   The patient was seen by Dr. Julien Nordmann today.  Labs were reviewed.  Recommend that she ***with cycle #10 today scheduled.  I will arrange for restaging CT scan the chest, abdomen, pelvis prior to starting her next cycle of treatment.  We will see her back for follow-up visit in 3 weeks for evaluation and repeat blood work and to review her scan before starting cycle #11.  Brain MRI on 04/06/22  Encouraged the patient to resume her supplemental drinks due to her poor appetite.  The patient was advised to call immediately if  she has any concerning symptoms in the interval. The patient voices understanding of current disease status and treatment options and is in agreement with the current care plan. All questions were answered. The patient knows to call the clinic with any problems, questions or concerns. We can certainly see the patient much sooner if necessary     No orders of the defined types were placed in this encounter.    I spent {CHL ONC TIME VISIT - [id number]} counseling the patient face to face. The total time spent in the appointment was {CHL ONC TIME VISIT - RAJHH:[id number]}.  Iosefa Weintraub L Andersen Mckiver, PA-C 03/28/22

## 2022-03-30 ENCOUNTER — Inpatient Hospital Stay (HOSPITAL_BASED_OUTPATIENT_CLINIC_OR_DEPARTMENT_OTHER): Payer: Medicare HMO | Admitting: Physician Assistant

## 2022-03-30 ENCOUNTER — Inpatient Hospital Stay: Payer: Medicare HMO

## 2022-03-30 VITALS — BP 131/69 | HR 65 | Temp 97.7°F | Resp 16 | Ht 67.0 in | Wt 142.8 lb

## 2022-03-30 DIAGNOSIS — Z923 Personal history of irradiation: Secondary | ICD-10-CM | POA: Diagnosis not present

## 2022-03-30 DIAGNOSIS — Z5112 Encounter for antineoplastic immunotherapy: Secondary | ICD-10-CM | POA: Diagnosis not present

## 2022-03-30 DIAGNOSIS — C778 Secondary and unspecified malignant neoplasm of lymph nodes of multiple regions: Secondary | ICD-10-CM | POA: Diagnosis not present

## 2022-03-30 DIAGNOSIS — C3412 Malignant neoplasm of upper lobe, left bronchus or lung: Secondary | ICD-10-CM

## 2022-03-30 DIAGNOSIS — Z8719 Personal history of other diseases of the digestive system: Secondary | ICD-10-CM | POA: Diagnosis not present

## 2022-03-30 DIAGNOSIS — Z79899 Other long term (current) drug therapy: Secondary | ICD-10-CM | POA: Diagnosis not present

## 2022-03-30 DIAGNOSIS — Z882 Allergy status to sulfonamides status: Secondary | ICD-10-CM | POA: Diagnosis not present

## 2022-03-30 DIAGNOSIS — Z888 Allergy status to other drugs, medicaments and biological substances status: Secondary | ICD-10-CM | POA: Diagnosis not present

## 2022-03-30 DIAGNOSIS — Z87440 Personal history of urinary (tract) infections: Secondary | ICD-10-CM | POA: Diagnosis not present

## 2022-03-30 DIAGNOSIS — Z880 Allergy status to penicillin: Secondary | ICD-10-CM | POA: Diagnosis not present

## 2022-03-30 DIAGNOSIS — E785 Hyperlipidemia, unspecified: Secondary | ICD-10-CM | POA: Diagnosis not present

## 2022-03-30 DIAGNOSIS — J449 Chronic obstructive pulmonary disease, unspecified: Secondary | ICD-10-CM | POA: Diagnosis not present

## 2022-03-30 DIAGNOSIS — Z5111 Encounter for antineoplastic chemotherapy: Secondary | ICD-10-CM | POA: Diagnosis not present

## 2022-03-30 DIAGNOSIS — C7931 Secondary malignant neoplasm of brain: Secondary | ICD-10-CM | POA: Diagnosis not present

## 2022-03-30 DIAGNOSIS — E039 Hypothyroidism, unspecified: Secondary | ICD-10-CM | POA: Diagnosis not present

## 2022-03-30 DIAGNOSIS — I252 Old myocardial infarction: Secondary | ICD-10-CM | POA: Diagnosis not present

## 2022-03-30 DIAGNOSIS — R61 Generalized hyperhidrosis: Secondary | ICD-10-CM | POA: Diagnosis not present

## 2022-03-30 LAB — CBC WITH DIFFERENTIAL (CANCER CENTER ONLY)
Abs Immature Granulocytes: 0.01 10*3/uL (ref 0.00–0.07)
Basophils Absolute: 0.1 10*3/uL (ref 0.0–0.1)
Basophils Relative: 2 %
Eosinophils Absolute: 0.4 10*3/uL (ref 0.0–0.5)
Eosinophils Relative: 8 %
HCT: 33.4 % — ABNORMAL LOW (ref 36.0–46.0)
Hemoglobin: 11.1 g/dL — ABNORMAL LOW (ref 12.0–15.0)
Immature Granulocytes: 0 %
Lymphocytes Relative: 20 %
Lymphs Abs: 1.1 10*3/uL (ref 0.7–4.0)
MCH: 35.1 pg — ABNORMAL HIGH (ref 26.0–34.0)
MCHC: 33.2 g/dL (ref 30.0–36.0)
MCV: 105.7 fL — ABNORMAL HIGH (ref 80.0–100.0)
Monocytes Absolute: 0.6 10*3/uL (ref 0.1–1.0)
Monocytes Relative: 11 %
Neutro Abs: 3.2 10*3/uL (ref 1.7–7.7)
Neutrophils Relative %: 59 %
Platelet Count: 301 10*3/uL (ref 150–400)
RBC: 3.16 MIL/uL — ABNORMAL LOW (ref 3.87–5.11)
RDW: 13.9 % (ref 11.5–15.5)
WBC Count: 5.5 10*3/uL (ref 4.0–10.5)
nRBC: 0 % (ref 0.0–0.2)

## 2022-03-30 LAB — CMP (CANCER CENTER ONLY)
ALT: 18 U/L (ref 0–44)
AST: 29 U/L (ref 15–41)
Albumin: 3.9 g/dL (ref 3.5–5.0)
Alkaline Phosphatase: 83 U/L (ref 38–126)
Anion gap: 6 (ref 5–15)
BUN: 14 mg/dL (ref 8–23)
CO2: 30 mmol/L (ref 22–32)
Calcium: 9.2 mg/dL (ref 8.9–10.3)
Chloride: 104 mmol/L (ref 98–111)
Creatinine: 1 mg/dL (ref 0.44–1.00)
GFR, Estimated: 59 mL/min — ABNORMAL LOW (ref >60)
Glucose, Bld: 140 mg/dL — ABNORMAL HIGH (ref 70–99)
Potassium: 3.7 mmol/L (ref 3.5–5.1)
Sodium: 140 mmol/L (ref 135–145)
Total Bilirubin: 0.3 mg/dL (ref 0.3–1.2)
Total Protein: 7.3 g/dL (ref 6.5–8.1)

## 2022-03-30 MED ORDER — PROCHLORPERAZINE MALEATE 10 MG PO TABS
10.0000 mg | ORAL_TABLET | Freq: Once | ORAL | Status: AC
  Administered 2022-03-30: 10 mg via ORAL
  Filled 2022-03-30: qty 1

## 2022-03-30 MED ORDER — SODIUM CHLORIDE 0.9 % IV SOLN
500.0000 mg/m2 | Freq: Once | INTRAVENOUS | Status: AC
  Administered 2022-03-30: 900 mg via INTRAVENOUS
  Filled 2022-03-30: qty 20

## 2022-03-30 MED ORDER — SODIUM CHLORIDE 0.9 % IV SOLN: Freq: Once | INTRAVENOUS | Status: AC

## 2022-03-30 MED ORDER — SODIUM CHLORIDE 0.9 % IV SOLN
200.0000 mg | Freq: Once | INTRAVENOUS | Status: AC
  Administered 2022-03-30: 200 mg via INTRAVENOUS
  Filled 2022-03-30: qty 8

## 2022-03-30 NOTE — Patient Instructions (Signed)
[facility]   Discharge Instructions: Thank you for choosing [facility] to provide your oncology and hematology care.   If you have a lab appointment with the [facility], please go directly to the [facility] and check in at the registration area.   Wear comfortable clothing and clothing appropriate for easy access to any Portacath or PICC line.   We strive to give you quality time with your provider. You may need to reschedule your appointment if you arrive late (15 or more minutes).  Arriving late affects you and other patients whose appointments are after yours.  Also, if you miss three or more appointments without notifying the office, you may be dismissed from the clinic at the provider's discretion.      For prescription refill requests, have your pharmacy contact our office and allow 72 hours for refills to be completed.    Today you received the following chemotherapy and/or immunotherapy agents: pembrolizumab and pemetrexed      To help prevent nausea and vomiting after your treatment, we encourage you to take your nausea medication as directed.  BELOW ARE SYMPTOMS THAT SHOULD BE REPORTED IMMEDIATELY: *FEVER GREATER THAN 100.4 F (38 C) OR HIGHER *CHILLS OR SWEATING *NAUSEA AND VOMITING THAT IS NOT CONTROLLED WITH YOUR NAUSEA MEDICATION *UNUSUAL SHORTNESS OF BREATH *UNUSUAL BRUISING OR BLEEDING *URINARY PROBLEMS (pain or burning when urinating, or frequent urination) *BOWEL PROBLEMS (unusual diarrhea, constipation, pain near the anus) TENDERNESS IN MOUTH AND THROAT WITH OR WITHOUT PRESENCE OF ULCERS (sore throat, sores in mouth, or a toothache) UNUSUAL RASH, SWELLING OR PAIN  UNUSUAL VAGINAL DISCHARGE OR ITCHING   Items with * indicate a potential emergency and should be followed up as soon as possible or go to the Emergency Department if any problems should occur.  Please show the CHEMOTHERAPY ALERT CARD or IMMUNOTHERAPY ALERT  CARD at check-in to the Emergency Department and triage nurse.  Should you have questions after your visit or need to cancel or reschedule your appointment, please contact [facility]  Dept: [phone]  and follow the prompts.  Office hours are 8:00 a.m. to 4:30 p.m. Monday - Friday. Please note that voicemails left after 4:00 p.m. may not be returned until the following business day.  We are closed weekends and major holidays. You have access to a nurse at all times for urgent questions. Please call the main number to the clinic Dept: [phone] and follow the prompts.   For any non-urgent questions, you may also contact your provider using MyChart. We now offer e-Visits for anyone 44 and older to request care online for non-urgent symptoms. For details visit [email or web address].   Also download the MyChart app! Go to the app store, search "MyChart", open the app, select [facility], and log in with your MyChart username and password.  Due to Covid, a mask is required upon entering the hospital/clinic. If you do not have a mask, one will be given to you upon arrival. For doctor visits, patients may have 1 support person aged 36 or older with them. For treatment visits, patients cannot have anyone with them due to current Covid guidelines and our immunocompromised population.

## 2022-04-05 ENCOUNTER — Ambulatory Visit: Payer: Self-pay | Admitting: Urology

## 2022-04-06 ENCOUNTER — Ambulatory Visit
Admission: RE | Admit: 2022-04-06 | Discharge: 2022-04-06 | Disposition: A | Discharge status: Home / Self Care | Payer: Medicare HMO | Source: Ambulatory Visit | Attending: Radiation Oncology | Admitting: Radiation Oncology

## 2022-04-06 DIAGNOSIS — C7931 Secondary malignant neoplasm of brain: Secondary | ICD-10-CM

## 2022-04-06 DIAGNOSIS — C3490 Malignant neoplasm of unspecified part of unspecified bronchus or lung: Secondary | ICD-10-CM | POA: Diagnosis not present

## 2022-04-06 DIAGNOSIS — Z982 Presence of cerebrospinal fluid drainage device: Secondary | ICD-10-CM | POA: Diagnosis not present

## 2022-04-06 DIAGNOSIS — I6782 Cerebral ischemia: Secondary | ICD-10-CM | POA: Diagnosis not present

## 2022-04-06 MED ORDER — GADOBENATE DIMEGLUMINE 529 MG/ML IV SOLN
13.0000 mL | Freq: Once | INTRAVENOUS | Status: AC | PRN
  Administered 2022-04-06: 13 mL via INTRAVENOUS

## 2022-04-10 ENCOUNTER — Inpatient Hospital Stay: Payer: Medicare HMO | Attending: Internal Medicine

## 2022-04-10 DIAGNOSIS — Z8719 Personal history of other diseases of the digestive system: Secondary | ICD-10-CM | POA: Insufficient documentation

## 2022-04-10 DIAGNOSIS — I252 Old myocardial infarction: Secondary | ICD-10-CM | POA: Insufficient documentation

## 2022-04-10 DIAGNOSIS — Z882 Allergy status to sulfonamides status: Secondary | ICD-10-CM | POA: Insufficient documentation

## 2022-04-10 DIAGNOSIS — I700 Atherosclerosis of aorta: Secondary | ICD-10-CM | POA: Insufficient documentation

## 2022-04-10 DIAGNOSIS — Z880 Allergy status to penicillin: Secondary | ICD-10-CM | POA: Insufficient documentation

## 2022-04-10 DIAGNOSIS — I6782 Cerebral ischemia: Secondary | ICD-10-CM | POA: Insufficient documentation

## 2022-04-10 DIAGNOSIS — Z5111 Encounter for antineoplastic chemotherapy: Secondary | ICD-10-CM | POA: Insufficient documentation

## 2022-04-10 DIAGNOSIS — E039 Hypothyroidism, unspecified: Secondary | ICD-10-CM | POA: Insufficient documentation

## 2022-04-10 DIAGNOSIS — Z87440 Personal history of urinary (tract) infections: Secondary | ICD-10-CM | POA: Insufficient documentation

## 2022-04-10 DIAGNOSIS — C3412 Malignant neoplasm of upper lobe, left bronchus or lung: Secondary | ICD-10-CM | POA: Insufficient documentation

## 2022-04-10 DIAGNOSIS — Z79899 Other long term (current) drug therapy: Secondary | ICD-10-CM | POA: Insufficient documentation

## 2022-04-10 DIAGNOSIS — C7931 Secondary malignant neoplasm of brain: Secondary | ICD-10-CM | POA: Insufficient documentation

## 2022-04-10 DIAGNOSIS — Z5112 Encounter for antineoplastic immunotherapy: Secondary | ICD-10-CM | POA: Insufficient documentation

## 2022-04-10 DIAGNOSIS — Z9221 Personal history of antineoplastic chemotherapy: Secondary | ICD-10-CM | POA: Insufficient documentation

## 2022-04-10 DIAGNOSIS — E785 Hyperlipidemia, unspecified: Secondary | ICD-10-CM | POA: Insufficient documentation

## 2022-04-10 DIAGNOSIS — Z888 Allergy status to other drugs, medicaments and biological substances status: Secondary | ICD-10-CM | POA: Insufficient documentation

## 2022-04-10 DIAGNOSIS — J432 Centrilobular emphysema: Secondary | ICD-10-CM | POA: Insufficient documentation

## 2022-04-10 DIAGNOSIS — Z923 Personal history of irradiation: Secondary | ICD-10-CM | POA: Insufficient documentation

## 2022-04-11 ENCOUNTER — Encounter: Payer: Self-pay | Admitting: Urology

## 2022-04-11 NOTE — Progress Notes (Addendum)
Telephone appointment. I spoke w/ patient's daughter Star Age, verified her identity and began nursing interview. Patient is doing well. No issues reported at this time.  Meaningful use complete. Postmenopausal- NO chances of pregnancy.  Reminded patient's daughter Doroteo Bradford of patient Jill [name] 11:30am-04/12/22 telephone appointment w/ Freeman Caldron PA-C. I left my extension [phone] in case patient needs anything. Patient's daughter verbalized understanding.  Patient contact [phone]

## 2022-04-12 ENCOUNTER — Telehealth: Payer: Self-pay

## 2022-04-12 ENCOUNTER — Ambulatory Visit
Admission: RE | Admit: 2022-04-12 | Discharge: 2022-04-12 | Disposition: A | Discharge status: Home / Self Care | Payer: Medicare HMO | Source: Ambulatory Visit | Attending: Urology | Admitting: Urology

## 2022-04-12 DIAGNOSIS — C3490 Malignant neoplasm of unspecified part of unspecified bronchus or lung: Secondary | ICD-10-CM

## 2022-04-12 DIAGNOSIS — C7931 Secondary malignant neoplasm of brain: Secondary | ICD-10-CM | POA: Diagnosis not present

## 2022-04-12 DIAGNOSIS — C3412 Malignant neoplasm of upper lobe, left bronchus or lung: Secondary | ICD-10-CM | POA: Diagnosis not present

## 2022-04-12 NOTE — Telephone Encounter (Signed)
Called and spoke with patient regarding needing her to schedule her CT Chest W/ Contrast, and CT Abdomen Pelvis W/ Contrast before 04/19/2022. Gave the pt the Centralized scheduling # [phone], option 3 to schedule the scans as soon as possible, preferably before 04/19/22 since she is seeing Dr.Mohamed that day. Pt verbalized understanding and will get it scheduled.

## 2022-04-12 NOTE — Progress Notes (Signed)
Radiation Oncology         [phone] ________________________________  Name: Jill [name] MRN: [id number]  Date: 04/12/2022  DOB: Aug 19, 1951  Post Treatment Note  CC: [name], MD  Curt Bears, MD  Diagnosis:   71 yo woman with stage IV NSCLC, adenocarcinoma of the left upper lung - Stage IVA - with brain metastases     Interval Since Last Radiation: 6 months 09/28/21: SRS//PTV2-3: The metastatic lesions  in the left occipital (3 mm) and left temporal (1 mm) were treated to 20 Gy in a single fraction  06/23/21: SRS// PTV1: The solitary 7 mm left frontal  brain metastasis was treated to 20 Gy in a single fraction   06/15/21 - 08/01/21: The primary tumor in the left lung and nodes were treated to 66 Gy in 33 fractions of 2 Gy (concurrent with chemotherapy).  Narrative:  I contacted the patient to conduct her routine scheduled 3 month follow up visit to review results of her recent MRI brain scan via telephone to spare the patient unnecessary potential exposure in the healthcare setting during the current COVID-19 pandemic.  The patient was notified in advance and gave permission to proceed with this visit format.    She tolerated her recent [facility] treatment relatively well without any ill side effects aside from moderate fatigue which she feels has now resolved and she is currently without complaints.  Her recent MRI brain scan from 04/06/2022 shows a stable appearance of a 4 mm focus of enhancement in the left frontal lobe (previously treated lesion) and no visible sequela of previously treated lesions in the left temporal lobe and left occipital lobe.  There are no new lesions.  We reviewed these results today.                        On review of systems, the patient states that she is doing well in general and is currently without complaints aside from allergies and a mild, persistent cough that is unchanged recently.  She specifically denies chest pain, increased shortness of  breath or hemoptysis.  She reports a healthy appetite and is maintaining her weight.  She denies dysphagia, abdominal pain, nausea, vomiting or night sweats.  She has occasional, mild headaches but nothing that has been persistent or concerning.  She denies any decrease in her visual or auditory acuity, dizziness/imbalance, focal weakness in the upper or lower extremities, tremor or seizure activity.  She initially completed 6 cycles of systemic therapy with carboplatin/paclitaxel systemic chemotherapy prior to switching to carboplatin, Alimta and Keytruda which she completed 5 cycles of. She is now on maintenance therapy with Alimta/Keytruda since 12/29/2021 and is tolerating this well.  Overall, she is pleased with her progress to date.  Her 12th grandchild, 5th grand-daughter, was born 11/02/2021 and is now sitting up on her own.  ALLERGIES:  is allergic to varenicline, ace inhibitors, prednisone, betalin 12 [vitamin b12], penicillins, and sulfa drugs cross reactors.  Meds: Current Outpatient Medications  Medication Sig Dispense Refill   acetaminophen (TYLENOL) 500 MG tablet Take 500 mg by mouth every 6 (six) hours as needed.     amLODipine (NORVASC) 10 MG tablet Take 1 tablet by mouth once daily 90 tablet 1   amoxicillin-clavulanate (AUGMENTIN) 875-125 MG tablet Take 1 tablet by mouth 2 (two) times daily. (Patient not taking: Reported on 10/26/2021) 12 tablet 0   aspirin EC 81 MG tablet Take 1 tablet (81 mg total) by  mouth daily. 90 tablet 2   atorvastatin (LIPITOR) 40 MG tablet Take 1 tablet (40 mg total) by mouth daily. 90 tablet 1   Cholecalciferol (VITAMIN D3) 250 MCG (10000 UT) capsule Take 10,000 mcg by mouth daily.     escitalopram (LEXAPRO) 10 MG tablet Take 1 tablet by mouth once daily 90 tablet 1   folic acid (FOLVITE) 1 MG tablet Take 1 tablet by mouth once daily 30 tablet 0   levothyroxine (SYNTHROID) 137 MCG tablet Take 1 tablet (137 mcg total) by mouth daily before breakfast. 30  tablet 1   losartan (COZAAR) 100 MG tablet Take 1 tablet by mouth once daily 90 tablet 1   metoprolol succinate (TOPROL-XL) 50 MG 24 hr tablet TAKE 1 TABLET BY MOUTH ONCE DAILY WITH A MEAL OR  IMMEDIATELY  FOLLOWING 90 tablet 0   Multiple Vitamins-Minerals (MULTIVITAMIN WITH MINERALS) tablet Take 1 tablet by mouth daily.     nitroGLYCERIN (NITROSTAT) 0.4 MG SL tablet Place 1 tablet (0.4 mg total) under the tongue every 5 (five) minutes as needed. For chest pain (Patient not taking: Reported on 10/26/2021) 25 tablet 6   prochlorperazine (COMPAZINE) 10 MG tablet Take 1 tablet (10 mg total) by mouth every 6 (six) hours as needed for nausea or vomiting. (Patient not taking: Reported on 10/26/2021) 30 tablet 0   Vitamin A 2400 MCG (8000 UT) TABS Take 2,400 mg by mouth daily.     vitamin B-12 (CYANOCOBALAMIN) 1000 MCG tablet Take 1,000 mcg by mouth daily. (Patient not taking: Reported on 10/26/2021)     No current facility-administered medications for this encounter.    Physical Findings:  vitals were not taken for this visit.  Pain Assessment Pain Score: 0-No pain/10 Unable to assess due to telephone follow-up visit format.  Lab Findings: Lab Results  Component Value Date   WBC 5.5 03/30/2022   HGB 11.1 (L) 03/30/2022   HCT 33.4 (L) 03/30/2022   MCV 105.7 (H) 03/30/2022   PLT 301 03/30/2022     Radiographic Findings: MR Brain W Wo Contrast  Result Date: 04/06/2022 CLINICAL DATA:  Brain metastases. Assess treatment response. Lung cancer primary. EXAM: MRI HEAD WITHOUT AND WITH CONTRAST TECHNIQUE: Multiplanar, multiecho pulse sequences of the brain and surrounding structures were obtained without and with intravenous contrast. CONTRAST:  70mL MULTIHANCE GADOBENATE DIMEGLUMINE 529 MG/ML IV SOLN COMPARISON:  12/30/2021.  09/15/2021.  06/09/2021.  06/03/2021. FINDINGS: Brain: Diffusion imaging does not show any acute or subacute infarction or other cause of restricted diffusion. No abnormality  seen affecting the brainstem or cerebellum. Previously coiled basilar tip aneurysm. Cerebral hemispheres show chronic small-vessel ischemic changes of the white matter. No large vessel territory infarction. Gliosis and hemosiderin deposition in the right frontal lobe related to a previous ventriculostomy is unchanged. Treated left frontal metastasis appears the same with a 4 mm focus enhancement, axial image 107 series 14. Previously treated left temporal lesion and left occipital lesion remain inapparent without enhancement. No newly seen lesion. No hydrocephalus or extra-axial collection. Vascular: Major vessels at the base of the brain show flow. Skull and upper cervical spine: Negative Sinuses/Orbits: Clear/normal Other: None IMPRESSION: Stable appearance of a 4 mm focus of enhancement in the left frontal lobe, axial image 107 series 14, when compared to the study of 12/30/2021. Smaller than on the prior study of 09/15/2021 when it measured 7 mm. No visible sequela of previously treated lesions in the left temporal lobe and left occipital lobe. No new lesions. Chronic small-vessel ischemic  changes of the white matter without evidence of recent infarction. Previously coiled basilar tip aneurysm. Electronically Signed   By: Nelson Chimes M.D.   On: 04/06/2022 15:39    Impression/Plan: 34. 71 yo woman with brain metastasis secondary to metastatic, NSCLC, adenocarcinoma of the left upper lung - Stage IVA. She has recovered well from the effects of her recent [facility] radiotherapy and is currently without complaints.  She continues on maintenance therapy with Alimta and Keytruda which she is tolerating well.   Her recent MRI brain scan from 04/06/2022 shows a stable appearance of a 4 mm focus of enhancement in the left frontal lobe (previously treated lesion) and no visible sequela of previously treated lesions in the left temporal lobe and left occipital lobe.  There are no new lesions to suggest disease recurrence or  progression.  She will continue management of her systemic disease under the care and direction of Dr. Earlie Server and is scheduled for a follow up visit 04/19/22 with repeat CT C/A/P prior to that visit, for disease restaging.  Regarding the brain metastases, we will continue to monitor closely with serial MRI brain scans every 3 months going forward and plan to connect by telephone following each scan to review results and recommendations from the multidisciplinary brain tumor board.  She appears to have a good understanding of these recommendations and is comfortable and in agreement with the stated plan.  She knows to call at anytime in the interim with any questions or concerns related to her previous radiation.   I personally spent 20 minutes in this encounter including chart review, reviewing radiological studies, telephone discussion with the patient, entering orders and completing documentation.     Nicholos Johns, PA-C

## 2022-04-17 ENCOUNTER — Ambulatory Visit (HOSPITAL_COMMUNITY)
Admission: RE | Admit: 2022-04-17 | Discharge: 2022-04-17 | Disposition: A | Discharge status: Home / Self Care | Payer: Medicare HMO | Source: Ambulatory Visit | Attending: Physician Assistant | Admitting: Physician Assistant

## 2022-04-17 DIAGNOSIS — J439 Emphysema, unspecified: Secondary | ICD-10-CM | POA: Diagnosis not present

## 2022-04-17 DIAGNOSIS — I700 Atherosclerosis of aorta: Secondary | ICD-10-CM | POA: Diagnosis not present

## 2022-04-17 DIAGNOSIS — C3412 Malignant neoplasm of upper lobe, left bronchus or lung: Secondary | ICD-10-CM | POA: Insufficient documentation

## 2022-04-17 DIAGNOSIS — C3490 Malignant neoplasm of unspecified part of unspecified bronchus or lung: Secondary | ICD-10-CM | POA: Diagnosis not present

## 2022-04-17 DIAGNOSIS — K769 Liver disease, unspecified: Secondary | ICD-10-CM | POA: Diagnosis not present

## 2022-04-17 DIAGNOSIS — R918 Other nonspecific abnormal finding of lung field: Secondary | ICD-10-CM | POA: Diagnosis not present

## 2022-04-17 MED ORDER — IOHEXOL 300 MG/ML  SOLN
100.0000 mL | Freq: Once | INTRAMUSCULAR | Status: AC | PRN
  Administered 2022-04-17: 100 mL via INTRAVENOUS

## 2022-04-19 ENCOUNTER — Other Ambulatory Visit: Payer: Self-pay

## 2022-04-19 ENCOUNTER — Inpatient Hospital Stay (HOSPITAL_BASED_OUTPATIENT_CLINIC_OR_DEPARTMENT_OTHER): Payer: Medicare HMO | Admitting: Internal Medicine

## 2022-04-19 ENCOUNTER — Encounter: Payer: Self-pay | Admitting: Internal Medicine

## 2022-04-19 ENCOUNTER — Other Ambulatory Visit: Payer: Self-pay | Admitting: Internal Medicine

## 2022-04-19 ENCOUNTER — Inpatient Hospital Stay: Payer: Medicare HMO

## 2022-04-19 VITALS — BP 131/61 | HR 72 | Temp 97.8°F | Resp 18 | Ht 67.0 in | Wt 139.5 lb

## 2022-04-19 DIAGNOSIS — I252 Old myocardial infarction: Secondary | ICD-10-CM | POA: Diagnosis not present

## 2022-04-19 DIAGNOSIS — E785 Hyperlipidemia, unspecified: Secondary | ICD-10-CM | POA: Diagnosis not present

## 2022-04-19 DIAGNOSIS — Z5112 Encounter for antineoplastic immunotherapy: Secondary | ICD-10-CM | POA: Diagnosis not present

## 2022-04-19 DIAGNOSIS — C3412 Malignant neoplasm of upper lobe, left bronchus or lung: Secondary | ICD-10-CM

## 2022-04-19 DIAGNOSIS — I700 Atherosclerosis of aorta: Secondary | ICD-10-CM | POA: Diagnosis not present

## 2022-04-19 DIAGNOSIS — Z79899 Other long term (current) drug therapy: Secondary | ICD-10-CM | POA: Diagnosis not present

## 2022-04-19 DIAGNOSIS — E039 Hypothyroidism, unspecified: Secondary | ICD-10-CM | POA: Diagnosis not present

## 2022-04-19 DIAGNOSIS — Z8719 Personal history of other diseases of the digestive system: Secondary | ICD-10-CM | POA: Diagnosis not present

## 2022-04-19 DIAGNOSIS — Z9221 Personal history of antineoplastic chemotherapy: Secondary | ICD-10-CM | POA: Diagnosis not present

## 2022-04-19 DIAGNOSIS — Z5111 Encounter for antineoplastic chemotherapy: Secondary | ICD-10-CM | POA: Diagnosis not present

## 2022-04-19 DIAGNOSIS — Z87440 Personal history of urinary (tract) infections: Secondary | ICD-10-CM | POA: Diagnosis not present

## 2022-04-19 DIAGNOSIS — Z888 Allergy status to other drugs, medicaments and biological substances status: Secondary | ICD-10-CM | POA: Diagnosis not present

## 2022-04-19 DIAGNOSIS — Z882 Allergy status to sulfonamides status: Secondary | ICD-10-CM | POA: Diagnosis not present

## 2022-04-19 DIAGNOSIS — Z880 Allergy status to penicillin: Secondary | ICD-10-CM | POA: Diagnosis not present

## 2022-04-19 DIAGNOSIS — I6782 Cerebral ischemia: Secondary | ICD-10-CM | POA: Diagnosis not present

## 2022-04-19 DIAGNOSIS — Z923 Personal history of irradiation: Secondary | ICD-10-CM | POA: Diagnosis not present

## 2022-04-19 DIAGNOSIS — C7931 Secondary malignant neoplasm of brain: Secondary | ICD-10-CM | POA: Diagnosis not present

## 2022-04-19 DIAGNOSIS — J432 Centrilobular emphysema: Secondary | ICD-10-CM | POA: Diagnosis not present

## 2022-04-19 LAB — CMP (CANCER CENTER ONLY)
ALT: 17 U/L (ref 0–44)
AST: 31 U/L (ref 15–41)
Albumin: 4 g/dL (ref 3.5–5.0)
Alkaline Phosphatase: 80 U/L (ref 38–126)
Anion gap: 6 (ref 5–15)
BUN: 9 mg/dL (ref 8–23)
CO2: 29 mmol/L (ref 22–32)
Calcium: 9.6 mg/dL (ref 8.9–10.3)
Chloride: 106 mmol/L (ref 98–111)
Creatinine: 0.97 mg/dL (ref 0.44–1.00)
GFR, Estimated: >60 mL/min (ref >60)
Glucose, Bld: 107 mg/dL — ABNORMAL HIGH (ref 70–99)
Potassium: 3.8 mmol/L (ref 3.5–5.1)
Sodium: 141 mmol/L (ref 135–145)
Total Bilirubin: 0.3 mg/dL (ref 0.3–1.2)
Total Protein: 7.4 g/dL (ref 6.5–8.1)

## 2022-04-19 LAB — CBC WITH DIFFERENTIAL (CANCER CENTER ONLY)
Abs Immature Granulocytes: 0.01 10*3/uL (ref 0.00–0.07)
Basophils Absolute: 0.1 10*3/uL (ref 0.0–0.1)
Basophils Relative: 2 %
Eosinophils Absolute: 0.4 10*3/uL (ref 0.0–0.5)
Eosinophils Relative: 8 %
HCT: 35.5 % — ABNORMAL LOW (ref 36.0–46.0)
Hemoglobin: 11.7 g/dL — ABNORMAL LOW (ref 12.0–15.0)
Immature Granulocytes: 0 %
Lymphocytes Relative: 22 %
Lymphs Abs: 1.1 10*3/uL (ref 0.7–4.0)
MCH: 34.6 pg — ABNORMAL HIGH (ref 26.0–34.0)
MCHC: 33 g/dL (ref 30.0–36.0)
MCV: 105 fL — ABNORMAL HIGH (ref 80.0–100.0)
Monocytes Absolute: 0.6 10*3/uL (ref 0.1–1.0)
Monocytes Relative: 11 %
Neutro Abs: 2.8 10*3/uL (ref 1.7–7.7)
Neutrophils Relative %: 57 %
Platelet Count: 337 10*3/uL (ref 150–400)
RBC: 3.38 MIL/uL — ABNORMAL LOW (ref 3.87–5.11)
RDW: 14.6 % (ref 11.5–15.5)
WBC Count: 4.9 10*3/uL (ref 4.0–10.5)
nRBC: 0 % (ref 0.0–0.2)

## 2022-04-19 LAB — TSH: TSH: 23.594 u[IU]/mL — ABNORMAL HIGH (ref 0.350–4.500)

## 2022-04-19 MED ORDER — PROCHLORPERAZINE MALEATE 10 MG PO TABS
10.0000 mg | ORAL_TABLET | Freq: Once | ORAL | Status: AC
  Administered 2022-04-19: 10 mg via ORAL
  Filled 2022-04-19: qty 1

## 2022-04-19 MED ORDER — SODIUM CHLORIDE 0.9 % IV SOLN
500.0000 mg/m2 | Freq: Once | INTRAVENOUS | Status: AC
  Administered 2022-04-19: 900 mg via INTRAVENOUS
  Filled 2022-04-19: qty 20

## 2022-04-19 MED ORDER — SODIUM CHLORIDE 0.9 % IV SOLN: Freq: Once | INTRAVENOUS | Status: AC

## 2022-04-19 MED ORDER — SODIUM CHLORIDE 0.9 % IV SOLN
200.0000 mg | Freq: Once | INTRAVENOUS | Status: AC
  Administered 2022-04-19: 200 mg via INTRAVENOUS
  Filled 2022-04-19: qty 200

## 2022-04-19 NOTE — Patient Instructions (Signed)
[facility]  Discharge Instructions: Thank you for choosing [facility] to provide your oncology and hematology care.   If you have a lab appointment with the [facility], please go directly to the [facility] and check in at the registration area.   Wear comfortable clothing and clothing appropriate for easy access to any Portacath or PICC line.   We strive to give you quality time with your provider. You may need to reschedule your appointment if you arrive late (15 or more minutes).  Arriving late affects you and other patients whose appointments are after yours.  Also, if you miss three or more appointments without notifying the office, you may be dismissed from the clinic at the provider's discretion.      For prescription refill requests, have your pharmacy contact our office and allow 72 hours for refills to be completed.    Today you received the following chemotherapy and/or immunotherapy agents: Keytruda and Alimta      To help prevent nausea and vomiting after your treatment, we encourage you to take your nausea medication as directed.  BELOW ARE SYMPTOMS THAT SHOULD BE REPORTED IMMEDIATELY: *FEVER GREATER THAN 100.4 F (38 C) OR HIGHER *CHILLS OR SWEATING *NAUSEA AND VOMITING THAT IS NOT CONTROLLED WITH YOUR NAUSEA MEDICATION *UNUSUAL SHORTNESS OF BREATH *UNUSUAL BRUISING OR BLEEDING *URINARY PROBLEMS (pain or burning when urinating, or frequent urination) *BOWEL PROBLEMS (unusual diarrhea, constipation, pain near the anus) TENDERNESS IN MOUTH AND THROAT WITH OR WITHOUT PRESENCE OF ULCERS (sore throat, sores in mouth, or a toothache) UNUSUAL RASH, SWELLING OR PAIN  UNUSUAL VAGINAL DISCHARGE OR ITCHING   Items with * indicate a potential emergency and should be followed up as soon as possible or go to the Emergency Department if any problems should occur.  Please show the CHEMOTHERAPY ALERT CARD or IMMUNOTHERAPY ALERT CARD at  check-in to the Emergency Department and triage nurse.  Should you have questions after your visit or need to cancel or reschedule your appointment, please contact [facility]  Dept: [phone]  and follow the prompts.  Office hours are 8:00 a.m. to 4:30 p.m. Monday - Friday. Please note that voicemails left after 4:00 p.m. may not be returned until the following business day.  We are closed weekends and major holidays. You have access to a nurse at all times for urgent questions. Please call the main number to the clinic Dept: [phone] and follow the prompts.   For any non-urgent questions, you may also contact your provider using MyChart. We now offer e-Visits for anyone 66 and older to request care online for non-urgent symptoms. For details visit [email or web address].   Also download the MyChart app! Go to the app store, search "MyChart", open the app, select [facility], and log in with your MyChart username and password.  Masks are optional in the cancer centers. If you would like for your care team to wear a mask while they are taking care of you, please let them know. For doctor visits, patients may have with them one support person who is at least 73 years old. At this time, visitors are not allowed in the infusion area.

## 2022-04-19 NOTE — Patient Instructions (Signed)
Steps to Quit Smoking Smoking tobacco is the leading cause of preventable death. It can affect almost every organ in the body. Smoking puts you and people around you at risk for many serious, long-lasting (chronic) diseases. Quitting smoking can be hard, but it is one of the best things that you can do for your health. It is never too late to quit. Do not give up if you cannot quit the first time. Some people need to try many times to quit. Do your best to stick to your quit plan, and talk with your doctor if you have any questions or concerns. How do I get ready to quit? Pick a date to quit. Set a date within the next 2 weeks to give you time to prepare. Write down the reasons why you are quitting. Keep this list in places where you will see it often. Tell your family, friends, and co-workers that you are quitting. Their support is important. Talk with your doctor about the choices that may help you quit. Find out if your health insurance will pay for these treatments. Know the people, places, things, and activities that make you want to smoke (triggers). Avoid them. What first steps can I take to quit smoking? Throw away all cigarettes at home, at work, and in your car. Throw away the things that you use when you smoke, such as ashtrays and lighters. Clean your car. Empty the ashtray. Clean your home, including curtains and carpets. What can I do to help me quit smoking? Talk with your doctor about taking medicines and seeing a counselor. You are more likely to succeed when you do both. If you are pregnant or breastfeeding: Talk with your doctor about counseling or other ways to quit smoking. Do not take medicine to help you quit smoking unless your doctor tells you to. Quit right away Quit smoking completely, instead of slowly cutting back on how much you smoke over a period of time. Stopping smoking right away may be more successful than slowly quitting. Go to counseling. In-person is best  if this is an option. You are more likely to quit if you go to counseling sessions regularly. Take medicine You may take medicines to help you quit. Some medicines need a prescription, and some you can buy over-the-counter. Some medicines may contain a drug called nicotine to replace the nicotine in cigarettes. Medicines may: Help you stop having the desire to smoke (cravings). Help to stop the problems that come when you stop smoking (withdrawal symptoms). Your doctor may ask you to use: Nicotine patches, gum, or lozenges. Nicotine inhalers or sprays. Non-nicotine medicine that you take by mouth. Find resources Find resources and other ways to help you quit smoking and remain smoke-free after you quit. They include: Online chats with a counselor. Phone quitlines. Printed self-help materials. Support groups or group counseling. Text messaging programs. Mobile phone apps. Use apps on your mobile phone or tablet that can help you stick to your quit plan. Examples of free services include Quit Guide from the CDC and smokefree.gov  What can I do to make it easier to quit?  Talk to your family and friends. Ask them to support and encourage you. Call a phone quitline, such as 1-800-QUIT-NOW, reach out to support groups, or work with a counselor. Ask people who smoke to not smoke around you. Avoid places that make you want to smoke, such as: Bars. Parties. Smoke-break areas at work. Spend time with people who do not smoke. Lower   the stress in your life. Stress can make you want to smoke. Try these things to lower stress: Getting regular exercise. Doing deep-breathing exercises. Doing yoga. Meditating. What benefits will I see if I quit smoking? Over time, you may have: A better sense of smell and taste. Less coughing and sore throat. A slower heart rate. Lower blood pressure. Clearer skin. Better breathing. Fewer sick days. Summary Quitting smoking can be hard, but it is one of  the best things that you can do for your health. Do not give up if you cannot quit the first time. Some people need to try many times to quit. When you decide to quit smoking, make a plan to help you succeed. Quit smoking right away, not slowly over a period of time. When you start quitting, get help and support to keep you smoke-free. This information is not intended to replace advice given to you by your health care provider. Make sure you discuss any questions you have with your health care provider. Document Revised: 10/14/2021 Document Reviewed: 10/14/2021 Elsevier Patient Education  2023 [employer]  

## 2022-04-19 NOTE — Progress Notes (Signed)
[place] [phone]   Fax:([phone]  OFFICE PROGRESS NOTE  [name], MD [place] [address]  DIAGNOSIS: Stage IV (T2 a, N2, M1b) non-small cell lung cancer, adenocarcinoma presented with left upper lobe lung mass in addition to left hilar and precarinal metastatic adenopathy and small left lower lobe pulmonary nodule in addition to solitary left frontal brain metastasis diagnosed in July 2022.  Molecular studies by [facility] [employer] showed  Positive KRAS G12C mutation PD-L1 expression was less than 1%   PRIOR THERAPY:  1) Status post SRS to the solitary brain metastasis. 20 Treatment for the locally advanced disease in the chest with carboplatin for AUC of 2 and paclitaxel 45 Mg/M2.  Status post 6 cycles.  Last dose was given 07/25/2021.  CURRENT THERAPY: Systemic chemotherapy with carboplatin for AUC of 5, Alimta 500 Mg/M2 and Keytruda 200 Mg IV every 3 weeks.  First dose August 31, 2021.  Status post 10 cycles.  Starting from cycle #5 the patient will be on maintenance treatment with Alimta and Keytruda every 3 weeks.  INTERVAL HISTORY: Jill [name] 71 y.o. female returns to the clinic today for follow-up visit.  The patient is feeling fine today with no concerning complaints.  She lost few pounds recently because she was working him the yard at regular basis.  She denied having any current chest pain, shortness of breath, cough or hemoptysis.  She has no nausea, vomiting, diarrhea or constipation.  She has no headache or visual changes.  She has no fever or chills.  The patient continues to tolerate her treatment with maintenance Alimta and Keytruda fairly well.  She is here for evaluation with repeat CT scan of the chest, abdomen and pelvis for restaging of her disease.  MEDICAL HISTORY: Past Medical History:  Diagnosis Date   Anxiety    ON PAXIL, XANAX   AVM (arteriovenous malformation) brain    s/p  stent/coil   Blood transfusion    x 2   Cancer (HCC)    Left lung   COPD (chronic obstructive pulmonary disease) (HCC)    no inhaler, no oxygen   Coronary artery disease    Prior inferior MI with stent to RCA, s/p CABG in 2008   Depression    Dizziness    Dyspnea    with exertion, no oxygen   Fatigue    Foot injury 06/01/2017   right - RESOLVED, no longer an issue per patient 05/18/21   GERD (gastroesophageal reflux disease)    Headache(784.0)    UNRUPTURED CEREBRAL ANEURYSM   Hyperlipidemia    Hypertension    Hypothyroidism    Infected cyst of skin 09/18/2013   Left knee pain 06/05/2012   Lung mass    Left lung   Myocardial infarction ([facility])    Normal nuclear stress test Ju;y 2012   No ischemia. EF 70%; fixed defect involving septum, inferoseptal and inferior wall   Pain, dental 02/06/2017   Poor dental hygiene    Retroperitoneal bleeding    Following cardiac cath   Tobacco abuse    Urine discoloration 09/18/2013   Urine incontinence 07/06/2020   UTI (lower urinary tract infection) 10/16/2013    ALLERGIES:  is allergic to varenicline, ace inhibitors, prednisone, betalin 12 [vitamin b12], penicillins, and sulfa drugs cross reactors.  MEDICATIONS:  Current Outpatient Medications  Medication Sig Dispense Refill   acetaminophen (TYLENOL) 500 MG tablet Take 500 mg by mouth every  6 (six) hours as needed.     amLODipine (NORVASC) 10 MG tablet Take 1 tablet by mouth once daily 90 tablet 1   amoxicillin-clavulanate (AUGMENTIN) 875-125 MG tablet Take 1 tablet by mouth 2 (two) times daily. (Patient not taking: Reported on 10/26/2021) 12 tablet 0   aspirin EC 81 MG tablet Take 1 tablet (81 mg total) by mouth daily. 90 tablet 2   atorvastatin (LIPITOR) 40 MG tablet Take 1 tablet (40 mg total) by mouth daily. 90 tablet 1   Cholecalciferol (VITAMIN D3) 250 MCG (10000 UT) capsule Take 10,000 mcg by mouth daily.     escitalopram (LEXAPRO) 10 MG tablet Take 1 tablet by mouth once daily  90 tablet 1   folic acid (FOLVITE) 1 MG tablet Take 1 tablet by mouth once daily 30 tablet 0   levothyroxine (SYNTHROID) 137 MCG tablet Take 1 tablet (137 mcg total) by mouth daily before breakfast. 30 tablet 1   losartan (COZAAR) 100 MG tablet Take 1 tablet by mouth once daily 90 tablet 1   metoprolol succinate (TOPROL-XL) 50 MG 24 hr tablet TAKE 1 TABLET BY MOUTH ONCE DAILY WITH A MEAL OR  IMMEDIATELY  FOLLOWING 90 tablet 0   Multiple Vitamins-Minerals (MULTIVITAMIN WITH MINERALS) tablet Take 1 tablet by mouth daily.     nitroGLYCERIN (NITROSTAT) 0.4 MG SL tablet Place 1 tablet (0.4 mg total) under the tongue every 5 (five) minutes as needed. For chest pain (Patient not taking: Reported on 10/26/2021) 25 tablet 6   prochlorperazine (COMPAZINE) 10 MG tablet Take 1 tablet (10 mg total) by mouth every 6 (six) hours as needed for nausea or vomiting. (Patient not taking: Reported on 10/26/2021) 30 tablet 0   Vitamin A 2400 MCG (8000 UT) TABS Take 2,400 mg by mouth daily.     vitamin B-12 (CYANOCOBALAMIN) 1000 MCG tablet Take 1,000 mcg by mouth daily. (Patient not taking: Reported on 10/26/2021)     No current facility-administered medications for this visit.    SURGICAL HISTORY:  Past Surgical History:  Procedure Laterality Date   CARDIAC CATHETERIZATION  01/02/2007   IT REVEALS MILD INFERIOR WALL HYPOKINESIS. THE EJECTION FRACTION IS AROUND 50%   COLONOSCOPY     CORONARY ARTERY BYPASS GRAFT  11/06/2006   LIMA to LAD, SVG to DX, SVG to LCX & SVG to OM 1 & 2, and SVG to PD   CORONARY STENT PLACEMENT     Remote past stent to RCA   EYE SURGERY Right    cataracts removed   HEMORRHOID SURGERY  11/07/1987   UPPER GI ENDOSCOPY     VENTRICULOSTOMY  10/06/2011   Procedure: VENTRICULOSTOMY;  Surgeon: Winfield Cunas;  Location: [facility];  Service: Neurosurgery;  Laterality: Right;  Insertion of Ventriculostomy Catheter   VIDEO BRONCHOSCOPY WITH ENDOBRONCHIAL NAVIGATION Left 05/20/2021    Procedure: VIDEO BRONCHOSCOPY WITH ENDOBRONCHIAL NAVIGATION;  Surgeon: Garner Nash, DO;  Location: [facility];  Service: Pulmonary;  Laterality: Left;   VIDEO BRONCHOSCOPY WITH ENDOBRONCHIAL ULTRASOUND Bilateral 05/20/2021   Procedure: VIDEO BRONCHOSCOPY WITH ENDOBRONCHIAL ULTRASOUND;  Surgeon: Garner Nash, DO;  Location: [facility];  Service: Pulmonary;  Laterality: Bilateral;   WISDOM TOOTH EXTRACTION      REVIEW OF SYSTEMS:  Constitutional: negative Eyes: negative Ears, nose, mouth, throat, and face: negative Respiratory: negative Cardiovascular: negative Gastrointestinal: negative Genitourinary:negative Integument/breast: negative Hematologic/lymphatic: negative Musculoskeletal:negative Neurological: negative Behavioral/Psych: negative Endocrine: negative Allergic/Immunologic: negative   PHYSICAL EXAMINATION: General appearance: alert, cooperative, and no distress Head: Normocephalic,  without obvious abnormality, atraumatic Neck: no adenopathy, no JVD, supple, symmetrical, trachea midline, and thyroid not enlarged, symmetric, no tenderness/mass/nodules Lymph nodes: Cervical, supraclavicular, and axillary nodes normal. Resp: clear to auscultation bilaterally Back: symmetric, no curvature. ROM normal. No CVA tenderness. Cardio: regular rate and rhythm, S1, S2 normal, no murmur, click, rub or gallop GI: soft, non-tender; bowel sounds normal; no masses,  no organomegaly Extremities: extremities normal, atraumatic, no cyanosis or edema Neurologic: Alert and oriented X 3, normal strength and tone. Normal symmetric reflexes. Normal coordination and gait  ECOG PERFORMANCE STATUS: 1 - Symptomatic but completely ambulatory  Blood pressure 131/61, pulse 72, temperature 97.8 F (36.6 C), temperature source Tympanic, resp. rate 18, height '5\' 7"'  (1.702 m), weight 139 lb 8 oz (63.3 kg), SpO2 95 %.  LABORATORY DATA: Lab Results  Component Value Date   WBC 4.9 04/19/2022   HGB 11.7 (L)  04/19/2022   HCT 35.5 (L) 04/19/2022   MCV 105.0 (H) 04/19/2022   PLT 337 04/19/2022      Chemistry      Component Value Date/Time   NA 140 03/30/2022 0946   NA 141 04/12/2021 1356   K 3.7 03/30/2022 0946   CL 104 03/30/2022 0946   CO2 30 03/30/2022 0946   BUN 14 03/30/2022 0946   BUN 13 04/12/2021 1356   CREATININE 1.00 03/30/2022 0946   CREATININE 0.99 08/29/2016 1130      Component Value Date/Time   CALCIUM 9.2 03/30/2022 0946   ALKPHOS 83 03/30/2022 0946   AST 29 03/30/2022 0946   ALT 18 03/30/2022 0946   BILITOT 0.3 03/30/2022 0946       RADIOGRAPHIC STUDIES: CT Chest W Contrast  Result Date: 04/18/2022 CLINICAL DATA:  Primary Cancer Type: Lung Imaging Indication: Assess response to therapy Interval therapy since last imaging? Yes Initial Cancer Diagnosis Date: 05/20/2021; Established by: Biopsy-proven Detailed Pathology: Stage IV non-small cell lung cancer, adenocarcinoma. Primary Tumor location:  Left upper lobe. Brain metastasis. Surgeries: CABG 2008. Coronary stent. Chemotherapy: Yes; Ongoing? Yes; Most recent administration: 03/30/2022 Immunotherapy?  Yes; Type: Keytruda; Ongoing? Yes Radiation therapy? Yes Date Range: 09/28/2021; Target: Left occipatal and temporal metastases. Date Range: 06/23/2021; Target: Left frontal brain metastasis Date Range: 06/15/2021 - 08/01/2021; Target: Left lung * Tracking Code: BO * EXAM: CT CHEST, ABDOMEN AND PELVIS WITH CONTRAST TECHNIQUE: Multidetector CT imaging of the chest was performed during intravenous contrast administration. RADIATION DOSE REDUCTION: This exam was performed according to the departmental dose-optimization program which includes automated exposure control, adjustment of the mA and/or kV according to patient size and/or use of iterative reconstruction technique. CONTRAST:  126m OMNIPAQUE IOHEXOL 300 MG/ML  SOLN COMPARISON:  Most recent CT chest, abdomen and pelvis 01/16/2022. 05/31/2021 PET-CT. FINDINGS: CT CHEST  FINDINGS Cardiovascular: Aortic atherosclerosis. Normal heart size. Three-vessel coronary artery calcifications status post median sternotomy and CABG. No pericardial effusion. Mediastinum/Nodes: No enlarged mediastinal, hilar, or axillary lymph nodes. Thyroid gland, trachea, and esophagus demonstrate no significant findings. Lungs/Pleura: Severe centrilobular and paraseptal emphysema. Unchanged, spiculated mass of the posterior left upper lobe measuring 3.0 x 2.1 cm (series 4, image 62). A previously noted subsolid nodule of the left lung base is resolved (series 4, image 128). Otherwise unchanged small pulmonary nodules, including a 0.4 cm left upper lobe nodule (series 4, image 51) and a 0.4 cm fissural nodule of the superior segment left lower lobe (series 4, image 77). No pleural effusion or pneumothorax. Musculoskeletal: No chest wall mass or suspicious osseous lesions identified. CT  ABDOMEN PELVIS FINDINGS Hepatobiliary: No solid liver abnormality is seen. Unchanged subcentimeter low-attenuation lesions of the right lobe of the liver, measuring up to 0.6 cm (series 2, image 72, 60). No significant change in a cystic lesion in the porta hepatis measuring approximately 3.1 x 1.6 cm, possibly a choledochal cyst (series 2, image 63). No gallstones, gallbladder wall thickening, or biliary dilatation. Pancreas: Unremarkable. No pancreatic ductal dilatation or surrounding inflammatory changes. Spleen: Normal in size without significant abnormality. Adrenals/Urinary Tract: Adrenal glands are unremarkable. Kidneys are normal, without renal calculi, solid lesion, or hydronephrosis. Bladder is unremarkable. Stomach/Bowel: Small diverticulum of the gastric fundus (series 2, image 59). Stomach is otherwise within normal limits. Appendix appears normal. No evidence of bowel wall thickening, distention, or inflammatory changes. Vascular/Lymphatic: Aortic atherosclerosis. No enlarged abdominal or pelvic lymph nodes.  Reproductive: No mass or other abnormality. Other: No abdominal wall hernia or abnormality. No ascites. Musculoskeletal: No acute osseous findings. IMPRESSION: 1. Unchanged, spiculated mass of the posterior left upper lobe, consistent with treated primary lung malignancy. 2. Interval resolution of a previously seen nonspecific subsolid nodule of the left lung base. Other small nodules of the left lung are unchanged. No new nodules. 3. No evidence of lymphadenopathy or metastatic disease in the abdomen or pelvis. 4. No significant change in a cystic lesion in the porta hepatis measuring approximately 3.1 x 1.6 cm, possibly a choledochal cyst. Attention on follow-up. 5. Severe emphysema. 6. Coronary artery disease. Aortic Atherosclerosis (ICD10-I70.0) and Emphysema (ICD10-J43.9). Electronically Signed   By: Delanna Ahmadi M.D.   On: 04/18/2022 11:18   CT Abdomen Pelvis W Contrast  Result Date: 04/18/2022 CLINICAL DATA:  Primary Cancer Type: Lung Imaging Indication: Assess response to therapy Interval therapy since last imaging? Yes Initial Cancer Diagnosis Date: 05/20/2021; Established by: Biopsy-proven Detailed Pathology: Stage IV non-small cell lung cancer, adenocarcinoma. Primary Tumor location:  Left upper lobe. Brain metastasis. Surgeries: CABG 2008. Coronary stent. Chemotherapy: Yes; Ongoing? Yes; Most recent administration: 03/30/2022 Immunotherapy?  Yes; Type: Keytruda; Ongoing? Yes Radiation therapy? Yes Date Range: 09/28/2021; Target: Left occipatal and temporal metastases. Date Range: 06/23/2021; Target: Left frontal brain metastasis Date Range: 06/15/2021 - 08/01/2021; Target: Left lung * Tracking Code: BO * EXAM: CT CHEST, ABDOMEN AND PELVIS WITH CONTRAST TECHNIQUE: Multidetector CT imaging of the chest was performed during intravenous contrast administration. RADIATION DOSE REDUCTION: This exam was performed according to the departmental dose-optimization program which includes automated exposure  control, adjustment of the mA and/or kV according to patient size and/or use of iterative reconstruction technique. CONTRAST:  119m OMNIPAQUE IOHEXOL 300 MG/ML  SOLN COMPARISON:  Most recent CT chest, abdomen and pelvis 01/16/2022. 05/31/2021 PET-CT. FINDINGS: CT CHEST FINDINGS Cardiovascular: Aortic atherosclerosis. Normal heart size. Three-vessel coronary artery calcifications status post median sternotomy and CABG. No pericardial effusion. Mediastinum/Nodes: No enlarged mediastinal, hilar, or axillary lymph nodes. Thyroid gland, trachea, and esophagus demonstrate no significant findings. Lungs/Pleura: Severe centrilobular and paraseptal emphysema. Unchanged, spiculated mass of the posterior left upper lobe measuring 3.0 x 2.1 cm (series 4, image 62). A previously noted subsolid nodule of the left lung base is resolved (series 4, image 128). Otherwise unchanged small pulmonary nodules, including a 0.4 cm left upper lobe nodule (series 4, image 51) and a 0.4 cm fissural nodule of the superior segment left lower lobe (series 4, image 77). No pleural effusion or pneumothorax. Musculoskeletal: No chest wall mass or suspicious osseous lesions identified. CT ABDOMEN PELVIS FINDINGS Hepatobiliary: No solid liver abnormality is seen. Unchanged subcentimeter low-attenuation  lesions of the right lobe of the liver, measuring up to 0.6 cm (series 2, image 72, 60). No significant change in a cystic lesion in the porta hepatis measuring approximately 3.1 x 1.6 cm, possibly a choledochal cyst (series 2, image 63). No gallstones, gallbladder wall thickening, or biliary dilatation. Pancreas: Unremarkable. No pancreatic ductal dilatation or surrounding inflammatory changes. Spleen: Normal in size without significant abnormality. Adrenals/Urinary Tract: Adrenal glands are unremarkable. Kidneys are normal, without renal calculi, solid lesion, or hydronephrosis. Bladder is unremarkable. Stomach/Bowel: Small diverticulum of the  gastric fundus (series 2, image 59). Stomach is otherwise within normal limits. Appendix appears normal. No evidence of bowel wall thickening, distention, or inflammatory changes. Vascular/Lymphatic: Aortic atherosclerosis. No enlarged abdominal or pelvic lymph nodes. Reproductive: No mass or other abnormality. Other: No abdominal wall hernia or abnormality. No ascites. Musculoskeletal: No acute osseous findings. IMPRESSION: 1. Unchanged, spiculated mass of the posterior left upper lobe, consistent with treated primary lung malignancy. 2. Interval resolution of a previously seen nonspecific subsolid nodule of the left lung base. Other small nodules of the left lung are unchanged. No new nodules. 3. No evidence of lymphadenopathy or metastatic disease in the abdomen or pelvis. 4. No significant change in a cystic lesion in the porta hepatis measuring approximately 3.1 x 1.6 cm, possibly a choledochal cyst. Attention on follow-up. 5. Severe emphysema. 6. Coronary artery disease. Aortic Atherosclerosis (ICD10-I70.0) and Emphysema (ICD10-J43.9). Electronically Signed   By: Delanna Ahmadi M.D.   On: 04/18/2022 11:18   MR Brain W Wo Contrast  Result Date: 04/06/2022 CLINICAL DATA:  Brain metastases. Assess treatment response. Lung cancer primary. EXAM: MRI HEAD WITHOUT AND WITH CONTRAST TECHNIQUE: Multiplanar, multiecho pulse sequences of the brain and surrounding structures were obtained without and with intravenous contrast. CONTRAST:  9m MULTIHANCE GADOBENATE DIMEGLUMINE 529 MG/ML IV SOLN COMPARISON:  12/30/2021.  09/15/2021.  06/09/2021.  06/03/2021. FINDINGS: Brain: Diffusion imaging does not show any acute or subacute infarction or other cause of restricted diffusion. No abnormality seen affecting the brainstem or cerebellum. Previously coiled basilar tip aneurysm. Cerebral hemispheres show chronic small-vessel ischemic changes of the white matter. No large vessel territory infarction. Gliosis and hemosiderin  deposition in the right frontal lobe related to a previous ventriculostomy is unchanged. Treated left frontal metastasis appears the same with a 4 mm focus enhancement, axial image 107 series 14. Previously treated left temporal lesion and left occipital lesion remain inapparent without enhancement. No newly seen lesion. No hydrocephalus or extra-axial collection. Vascular: Major vessels at the base of the brain show flow. Skull and upper cervical spine: Negative Sinuses/Orbits: Clear/normal Other: None IMPRESSION: Stable appearance of a 4 mm focus of enhancement in the left frontal lobe, axial image 107 series 14, when compared to the study of 12/30/2021. Smaller than on the prior study of 09/15/2021 when it measured 7 mm. No visible sequela of previously treated lesions in the left temporal lobe and left occipital lobe. No new lesions. Chronic small-vessel ischemic changes of the white matter without evidence of recent infarction. Previously coiled basilar tip aneurysm. Electronically Signed   By: MNelson ChimesM.D.   On: 04/06/2022 15:39    ASSESSMENT AND PLAN: This is a very pleasant 71years old white female diagnosed with a stage IV (T2 a, N2, M1 B) non-small cell lung cancer, adenocarcinoma presented with left upper lobe lung mass in addition to left hilar and precarinal metastatic adenopathy as well as small left lower lobe pulmonary nodule and solitary left frontal brain  metastasis diagnosed in July 2022.  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 The patient is status post SRS to the solitary brain metastasis. She underwent a course of concurrent chemoradiation to the locally advanced disease in the chest with carboplatin for AUC of 2 and paclitaxel 45 Mg/M2 status post 7 cycles.  The patient has been tolerating her treatment well with no concerning adverse effects. Her scan showed mild improvement of her disease with no concerning findings for  progression. Technically the patient has a stage IV lung cancer with brain metastasis  The patient is currently undergoing systemic chemotherapy with carboplatin for AUC of 5, Alimta 500 Mg/M2 and Keytruda 200 Mg IV every 3 weeks status post 10 cycles.  Starting from cycle #5 the patient will be on maintenance treatment with Alimta and Keytruda every 3 weeks.   The patient has been tolerating this treatment well with no concerning adverse effects. She had repeat CT scan of the chest, abdomen pelvis performed recently.  I personally and independently reviewed the scan and discussed the result with the patient today. Her scan showed no concerning findings for disease recurrence or metastasis. I recommended for her to continue her current treatment with maintenance Alimta and Keytruda and she will proceed with cycle #11 today. The patient was advised to call immediately if she has any other concerning symptoms in the interval. The patient voices understanding of current disease status and treatment options and is in agreement with the current care plan. The total time spent in the appointment was 30 minutes.  All questions were answered. The patient knows to call the clinic with any problems, questions or concerns. We can certainly see the patient much sooner if necessary.  Disclaimer: This note was dictated with voice recognition software. Similar sounding words can inadvertently be transcribed and may not be corrected upon review.

## 2022-04-20 ENCOUNTER — Encounter: Payer: Self-pay | Admitting: Internal Medicine

## 2022-04-27 ENCOUNTER — Encounter: Payer: Self-pay | Admitting: *Deleted

## 2022-04-27 NOTE — Progress Notes (Signed)
I followed up on Jill [name]'s treatment plan schedule. She is set up at this time.

## 2022-05-03 ENCOUNTER — Other Ambulatory Visit: Payer: Self-pay | Admitting: Family Medicine

## 2022-05-04 NOTE — Progress Notes (Deleted)
[facility] OFFICE PROGRESS NOTE  Jill Feil, MD [place] [address]  DIAGNOSIS: Stage IV (T2 a, N2, M1b) non-small cell lung cancer, adenocarcinoma presented with left upper lobe lung mass in addition to left hilar and precarinal metastatic adenopathy and small left lower lobe pulmonary nodule in addition to solitary left frontal brain metastasis diagnosed in July 2022.   Molecular studies by [facility] [employer] showed  Positive KRAS G12C mutation PD-L1 expression was less than 1%  PRIOR THERAPY: 1) Status post SRS to the solitary brain metastasis under the care of Dr. Tammi Klippel. Completed on 06/23/21 2)  Treatment for the locally advanced disease in the chest with carboplatin for AUC of 2 and paclitaxel 45 Mg/M2.  Status post 6 cycles.  Last dose was given 07/25/2021. 3) SRS to the additional metastatic brain lesions on 09/28/22  CURRENT THERAPY:  Systemic chemotherapy with carboplatin for AUC of 5, Alimta 500 Mg/M2 and Keytruda 200 Mg IV every 3 weeks.  First dose August 31, 2021. Status post 11 cycles. Starting from cycle #5, she will start Alimta and keytruda maintenance IV every 3 weeks.   INTERVAL HISTORY: Jill [name] 71 y.o. female returns to clinic today for follow-up visit.  The patient is feeling fairly well today without any concerning complaints except for fatigue.  The patient is currently undergoing maintenance chemotherapy and immunotherapy.  She is tolerating it fairly well besides the fatigue.  She denies any fever or chills.  She reports intermittent night sweats secondary to warm sleeping environment.  She has a fluctuating appetite.  But thinks she has been eating better recently?  She follows closely with radiation oncology for her history of metastatic disease to the brain.  She reports her baseline dyspnea exertion with certain activities.  She reports her baseline cough secondary to allergies.  She denies any chest pain or  hemoptysis.  Denies any nausea, vomiting, diarrhea, or constipation.  Denies any rashes or skin changes.  She is here today for evaluation and repeat blood work before starting cycle #12.     MEDICAL HISTORY: Past Medical History:  Diagnosis Date   Anxiety    ON PAXIL, XANAX   AVM (arteriovenous malformation) brain    s/p stent/coil   Blood transfusion    x 2   Cancer (HCC)    Left lung   COPD (chronic obstructive pulmonary disease) (HCC)    no inhaler, no oxygen   Coronary artery disease    Prior inferior MI with stent to RCA, s/p CABG in 2008   Depression    Dizziness    Dyspnea    with exertion, no oxygen   Fatigue    Foot injury 06/01/2017   right - RESOLVED, no longer an issue per patient 05/18/21   GERD (gastroesophageal reflux disease)    Headache(784.0)    UNRUPTURED CEREBRAL ANEURYSM   Hyperlipidemia    Hypertension    Hypothyroidism    Infected cyst of skin 09/18/2013   Left knee pain 06/05/2012   Lung mass    Left lung   Myocardial infarction ([facility])    Normal nuclear stress test Ju;y 2012   No ischemia. EF 70%; fixed defect involving septum, inferoseptal and inferior wall   Pain, dental 02/06/2017   Poor dental hygiene    Retroperitoneal bleeding    Following cardiac cath   Tobacco abuse    Urine discoloration 09/18/2013   Urine incontinence 07/06/2020   UTI (lower urinary tract infection)  10/16/2013    ALLERGIES:  is allergic to varenicline, ace inhibitors, prednisone, betalin 12 [vitamin b12], penicillins, and sulfa drugs cross reactors.  MEDICATIONS:  Current Outpatient Medications  Medication Sig Dispense Refill   acetaminophen (TYLENOL) 500 MG tablet Take 500 mg by mouth every 6 (six) hours as needed.     amLODipine (NORVASC) 10 MG tablet Take 1 tablet by mouth once daily 90 tablet 1   amoxicillin-clavulanate (AUGMENTIN) 875-125 MG tablet Take 1 tablet by mouth 2 (two) times daily. (Patient not taking: Reported on 10/26/2021) 12 tablet 0    aspirin EC 81 MG tablet Take 1 tablet (81 mg total) by mouth daily. 90 tablet 2   atorvastatin (LIPITOR) 40 MG tablet Take 1 tablet (40 mg total) by mouth daily. 90 tablet 1   Cholecalciferol (VITAMIN D3) 250 MCG (10000 UT) capsule Take 10,000 mcg by mouth daily.     escitalopram (LEXAPRO) 10 MG tablet Take 1 tablet by mouth once daily 90 tablet 1   folic acid (FOLVITE) 1 MG tablet Take 1 tablet by mouth once daily 30 tablet 0   levothyroxine (SYNTHROID) 137 MCG tablet TAKE 1 TABLET BY MOUTH ONCE DAILY BEFORE BREAKFAST 30 tablet 2   losartan (COZAAR) 100 MG tablet Take 1 tablet by mouth once daily 90 tablet 1   metoprolol succinate (TOPROL-XL) 50 MG 24 hr tablet TAKE 1 TABLET BY MOUTH ONCE DAILY WITH A MEAL OR  IMMEDIATELY  FOLLOWING 90 tablet 0   Multiple Vitamins-Minerals (MULTIVITAMIN WITH MINERALS) tablet Take 1 tablet by mouth daily.     nitroGLYCERIN (NITROSTAT) 0.4 MG SL tablet Place 1 tablet (0.4 mg total) under the tongue every 5 (five) minutes as needed. For chest pain (Patient not taking: Reported on 10/26/2021) 25 tablet 6   prochlorperazine (COMPAZINE) 10 MG tablet Take 1 tablet (10 mg total) by mouth every 6 (six) hours as needed for nausea or vomiting. (Patient not taking: Reported on 10/26/2021) 30 tablet 0   Vitamin A 2400 MCG (8000 UT) TABS Take 2,400 mg by mouth daily.     vitamin B-12 (CYANOCOBALAMIN) 1000 MCG tablet Take 1,000 mcg by mouth daily. (Patient not taking: Reported on 10/26/2021)     No current facility-administered medications for this visit.    SURGICAL HISTORY:  Past Surgical History:  Procedure Laterality Date   CARDIAC CATHETERIZATION  01/02/2007   IT REVEALS MILD INFERIOR WALL HYPOKINESIS. THE EJECTION FRACTION IS AROUND 50%   COLONOSCOPY     CORONARY ARTERY BYPASS GRAFT  11/06/2006   LIMA to LAD, SVG to DX, SVG to LCX & SVG to OM 1 & 2, and SVG to PD   CORONARY STENT PLACEMENT     Remote past stent to RCA   EYE SURGERY Right    cataracts removed    HEMORRHOID SURGERY  11/07/1987   UPPER GI ENDOSCOPY     VENTRICULOSTOMY  10/06/2011   Procedure: VENTRICULOSTOMY;  Surgeon: Winfield Cunas;  Location: [facility];  Service: Neurosurgery;  Laterality: Right;  Insertion of Ventriculostomy Catheter   VIDEO BRONCHOSCOPY WITH ENDOBRONCHIAL NAVIGATION Left 05/20/2021   Procedure: VIDEO BRONCHOSCOPY WITH ENDOBRONCHIAL NAVIGATION;  Surgeon: [facility] Nash, DO;  Location: [facility];  Service: Pulmonary;  Laterality: Left;   VIDEO BRONCHOSCOPY WITH ENDOBRONCHIAL ULTRASOUND Bilateral 05/20/2021   Procedure: VIDEO BRONCHOSCOPY WITH ENDOBRONCHIAL ULTRASOUND;  Surgeon: [facility] Nash, DO;  Location: [facility];  Service: Pulmonary;  Laterality: Bilateral;   WISDOM TOOTH EXTRACTION      REVIEW OF SYSTEMS:  Review of Systems  Constitutional: Negative for appetite change, chills, fatigue, fever and unexpected weight change.  HENT:   Negative for mouth sores, nosebleeds, sore throat and trouble swallowing.   Eyes: Negative for eye problems and icterus.  Respiratory: Negative for cough, hemoptysis, shortness of breath and wheezing.   Cardiovascular: Negative for chest pain and leg swelling.  Gastrointestinal: Negative for abdominal pain, constipation, diarrhea, nausea and vomiting.  Genitourinary: Negative for bladder incontinence, difficulty urinating, dysuria, frequency and hematuria.   Musculoskeletal: Negative for back pain, gait problem, neck pain and neck stiffness.  Skin: Negative for itching and rash.  Neurological: Negative for dizziness, extremity weakness, gait problem, headaches, light-headedness and seizures.  Hematological: Negative for adenopathy. Does not bruise/bleed easily.  Psychiatric/Behavioral: Negative for confusion, depression and sleep disturbance. The patient is not nervous/anxious.     PHYSICAL EXAMINATION:  There were no vitals taken for this visit.  ECOG PERFORMANCE STATUS: {CHL ONC ECOG [id number]  Physical Exam   Constitutional: Oriented to person, place, and time and well-developed, well-nourished, and in no distress. No distress.  HENT:  Head: Normocephalic and atraumatic.  Mouth/Throat: Oropharynx is clear and moist. No oropharyngeal exudate.  Eyes: Conjunctivae are normal. Right eye exhibits no discharge. Left eye exhibits no discharge. No scleral icterus.  Neck: Normal range of motion. Neck supple.  Cardiovascular: Normal rate, regular rhythm, normal heart sounds and intact distal pulses.   Pulmonary/Chest: Effort normal and breath sounds normal. No respiratory distress. No wheezes. No rales.  Abdominal: Soft. Bowel sounds are normal. Exhibits no distension and no mass. There is no tenderness.  Musculoskeletal: Normal range of motion. Exhibits no edema.  Lymphadenopathy:    No cervical adenopathy.  Neurological: Alert and oriented to person, place, and time. Exhibits normal muscle tone. Gait normal. Coordination normal.  Skin: Skin is warm and dry. No rash noted. Not diaphoretic. No erythema. No pallor.  Psychiatric: Mood, memory and judgment normal.  Vitals reviewed.  LABORATORY DATA: Lab Results  Component Value Date   WBC 4.9 04/19/2022   HGB 11.7 (L) 04/19/2022   HCT 35.5 (L) 04/19/2022   MCV 105.0 (H) 04/19/2022   PLT 337 04/19/2022      Chemistry      Component Value Date/Time   NA 141 04/19/2022 0921   NA 141 04/12/2021 1356   K 3.8 04/19/2022 0921   CL 106 04/19/2022 0921   CO2 29 04/19/2022 0921   BUN 9 04/19/2022 0921   BUN 13 04/12/2021 1356   CREATININE 0.97 04/19/2022 0921   CREATININE 0.99 08/29/2016 1130      Component Value Date/Time   CALCIUM 9.6 04/19/2022 0921   ALKPHOS 80 04/19/2022 0921   AST 31 04/19/2022 0921   ALT 17 04/19/2022 0921   BILITOT 0.3 04/19/2022 0921       RADIOGRAPHIC STUDIES:  CT Chest W Contrast  Result Date: 04/18/2022 CLINICAL DATA:  Primary Cancer Type: Lung Imaging Indication: Assess response to therapy Interval therapy  since last imaging? Yes Initial Cancer Diagnosis Date: 05/20/2021; Established by: Biopsy-proven Detailed Pathology: Stage IV non-small cell lung cancer, adenocarcinoma. Primary Tumor location:  Left upper lobe. Brain metastasis. Surgeries: CABG 2008. Coronary stent. Chemotherapy: Yes; Ongoing? Yes; Most recent administration: 03/30/2022 Immunotherapy?  Yes; Type: Keytruda; Ongoing? Yes Radiation therapy? Yes Date Range: 09/28/2021; Target: Left occipatal and temporal metastases. Date Range: 06/23/2021; Target: Left frontal brain metastasis Date Range: 06/15/2021 - 08/01/2021; Target: Left lung * Tracking Code: BO * EXAM: CT CHEST, ABDOMEN AND PELVIS WITH  CONTRAST TECHNIQUE: Multidetector CT imaging of the chest was performed during intravenous contrast administration. RADIATION DOSE REDUCTION: This exam was performed according to the departmental dose-optimization program which includes automated exposure control, adjustment of the mA and/or kV according to patient size and/or use of iterative reconstruction technique. CONTRAST:  139m OMNIPAQUE IOHEXOL 300 MG/ML  SOLN COMPARISON:  Most recent CT chest, abdomen and pelvis 01/16/2022. 05/31/2021 PET-CT. FINDINGS: CT CHEST FINDINGS Cardiovascular: Aortic atherosclerosis. Normal heart size. Three-vessel coronary artery calcifications status post median sternotomy and CABG. No pericardial effusion. Mediastinum/Nodes: No enlarged mediastinal, hilar, or axillary lymph nodes. Thyroid gland, trachea, and esophagus demonstrate no significant findings. Lungs/Pleura: Severe centrilobular and paraseptal emphysema. Unchanged, spiculated mass of the posterior left upper lobe measuring 3.0 x 2.1 cm (series 4, image 62). A previously noted subsolid nodule of the left lung base is resolved (series 4, image 128). Otherwise unchanged small pulmonary nodules, including a 0.4 cm left upper lobe nodule (series 4, image 51) and a 0.4 cm fissural nodule of the superior segment left lower  lobe (series 4, image 77). No pleural effusion or pneumothorax. Musculoskeletal: No chest wall mass or suspicious osseous lesions identified. CT ABDOMEN PELVIS FINDINGS Hepatobiliary: No solid liver abnormality is seen. Unchanged subcentimeter low-attenuation lesions of the right lobe of the liver, measuring up to 0.6 cm (series 2, image 72, 60). No significant change in a cystic lesion in the porta hepatis measuring approximately 3.1 x 1.6 cm, possibly a choledochal cyst (series 2, image 63). No gallstones, gallbladder wall thickening, or biliary dilatation. Pancreas: Unremarkable. No pancreatic ductal dilatation or surrounding inflammatory changes. Spleen: Normal in size without significant abnormality. Adrenals/Urinary Tract: Adrenal glands are unremarkable. Kidneys are normal, without renal calculi, solid lesion, or hydronephrosis. Bladder is unremarkable. Stomach/Bowel: Small diverticulum of the gastric fundus (series 2, image 59). Stomach is otherwise within normal limits. Appendix appears normal. No evidence of bowel wall thickening, distention, or inflammatory changes. Vascular/Lymphatic: Aortic atherosclerosis. No enlarged abdominal or pelvic lymph nodes. Reproductive: No mass or other abnormality. Other: No abdominal wall hernia or abnormality. No ascites. Musculoskeletal: No acute osseous findings. IMPRESSION: 1. Unchanged, spiculated mass of the posterior left upper lobe, consistent with treated primary lung malignancy. 2. Interval resolution of a previously seen nonspecific subsolid nodule of the left lung base. Other small nodules of the left lung are unchanged. No new nodules. 3. No evidence of lymphadenopathy or metastatic disease in the abdomen or pelvis. 4. No significant change in a cystic lesion in the porta hepatis measuring approximately 3.1 x 1.6 cm, possibly a choledochal cyst. Attention on follow-up. 5. Severe emphysema. 6. Coronary artery disease. Aortic Atherosclerosis (ICD10-I70.0) and  Emphysema (ICD10-J43.9). Electronically Signed   By: ADelanna AhmadiM.D.   On: 04/18/2022 11:18   CT Abdomen Pelvis W Contrast  Result Date: 04/18/2022 CLINICAL DATA:  Primary Cancer Type: Lung Imaging Indication: Assess response to therapy Interval therapy since last imaging? Yes Initial Cancer Diagnosis Date: 05/20/2021; Established by: Biopsy-proven Detailed Pathology: Stage IV non-small cell lung cancer, adenocarcinoma. Primary Tumor location:  Left upper lobe. Brain metastasis. Surgeries: CABG 2008. Coronary stent. Chemotherapy: Yes; Ongoing? Yes; Most recent administration: 03/30/2022 Immunotherapy?  Yes; Type: Keytruda; Ongoing? Yes Radiation therapy? Yes Date Range: 09/28/2021; Target: Left occipatal and temporal metastases. Date Range: 06/23/2021; Target: Left frontal brain metastasis Date Range: 06/15/2021 - 08/01/2021; Target: Left lung * Tracking Code: BO * EXAM: CT CHEST, ABDOMEN AND PELVIS WITH CONTRAST TECHNIQUE: Multidetector CT imaging of the chest was performed during intravenous contrast  administration. RADIATION DOSE REDUCTION: This exam was performed according to the departmental dose-optimization program which includes automated exposure control, adjustment of the mA and/or kV according to patient size and/or use of iterative reconstruction technique. CONTRAST:  162m OMNIPAQUE IOHEXOL 300 MG/ML  SOLN COMPARISON:  Most recent CT chest, abdomen and pelvis 01/16/2022. 05/31/2021 PET-CT. FINDINGS: CT CHEST FINDINGS Cardiovascular: Aortic atherosclerosis. Normal heart size. Three-vessel coronary artery calcifications status post median sternotomy and CABG. No pericardial effusion. Mediastinum/Nodes: No enlarged mediastinal, hilar, or axillary lymph nodes. Thyroid gland, trachea, and esophagus demonstrate no significant findings. Lungs/Pleura: Severe centrilobular and paraseptal emphysema. Unchanged, spiculated mass of the posterior left upper lobe measuring 3.0 x 2.1 cm (series 4, image 62). A  previously noted subsolid nodule of the left lung base is resolved (series 4, image 128). Otherwise unchanged small pulmonary nodules, including a 0.4 cm left upper lobe nodule (series 4, image 51) and a 0.4 cm fissural nodule of the superior segment left lower lobe (series 4, image 77). No pleural effusion or pneumothorax. Musculoskeletal: No chest wall mass or suspicious osseous lesions identified. CT ABDOMEN PELVIS FINDINGS Hepatobiliary: No solid liver abnormality is seen. Unchanged subcentimeter low-attenuation lesions of the right lobe of the liver, measuring up to 0.6 cm (series 2, image 72, 60). No significant change in a cystic lesion in the porta hepatis measuring approximately 3.1 x 1.6 cm, possibly a choledochal cyst (series 2, image 63). No gallstones, gallbladder wall thickening, or biliary dilatation. Pancreas: Unremarkable. No pancreatic ductal dilatation or surrounding inflammatory changes. Spleen: Normal in size without significant abnormality. Adrenals/Urinary Tract: Adrenal glands are unremarkable. Kidneys are normal, without renal calculi, solid lesion, or hydronephrosis. Bladder is unremarkable. Stomach/Bowel: Small diverticulum of the gastric fundus (series 2, image 59). Stomach is otherwise within normal limits. Appendix appears normal. No evidence of bowel wall thickening, distention, or inflammatory changes. Vascular/Lymphatic: Aortic atherosclerosis. No enlarged abdominal or pelvic lymph nodes. Reproductive: No mass or other abnormality. Other: No abdominal wall hernia or abnormality. No ascites. Musculoskeletal: No acute osseous findings. IMPRESSION: 1. Unchanged, spiculated mass of the posterior left upper lobe, consistent with treated primary lung malignancy. 2. Interval resolution of a previously seen nonspecific subsolid nodule of the left lung base. Other small nodules of the left lung are unchanged. No new nodules. 3. No evidence of lymphadenopathy or metastatic disease in the  abdomen or pelvis. 4. No significant change in a cystic lesion in the porta hepatis measuring approximately 3.1 x 1.6 cm, possibly a choledochal cyst. Attention on follow-up. 5. Severe emphysema. 6. Coronary artery disease. Aortic Atherosclerosis (ICD10-I70.0) and Emphysema (ICD10-J43.9). Electronically Signed   By: ADelanna AhmadiM.D.   On: 04/18/2022 11:18   MR Brain W Wo Contrast  Result Date: 04/06/2022 CLINICAL DATA:  Brain metastases. Assess treatment response. Lung cancer primary. EXAM: MRI HEAD WITHOUT AND WITH CONTRAST TECHNIQUE: Multiplanar, multiecho pulse sequences of the brain and surrounding structures were obtained without and with intravenous contrast. CONTRAST:  169mMULTIHANCE GADOBENATE DIMEGLUMINE 529 MG/ML IV SOLN COMPARISON:  12/30/2021.  09/15/2021.  06/09/2021.  06/03/2021. FINDINGS: Brain: Diffusion imaging does not show any acute or subacute infarction or other cause of restricted diffusion. No abnormality seen affecting the brainstem or cerebellum. Previously coiled basilar tip aneurysm. Cerebral hemispheres show chronic small-vessel ischemic changes of the white matter. No large vessel territory infarction. Gliosis and hemosiderin deposition in the right frontal lobe related to a previous ventriculostomy is unchanged. Treated left frontal metastasis appears the same with a 4 mm focus enhancement,  axial image 107 series 14. Previously treated left temporal lesion and left occipital lesion remain inapparent without enhancement. No newly seen lesion. No hydrocephalus or extra-axial collection. Vascular: Major vessels at the base of the brain show flow. Skull and upper cervical spine: Negative Sinuses/Orbits: Clear/normal Other: None IMPRESSION: Stable appearance of a 4 mm focus of enhancement in the left frontal lobe, axial image 107 series 14, when compared to the study of 12/30/2021. Smaller than on the prior study of 09/15/2021 when it measured 7 mm. No visible sequela of previously  treated lesions in the left temporal lobe and left occipital lobe. No new lesions. Chronic small-vessel ischemic changes of the white matter without evidence of recent infarction. Previously coiled basilar tip aneurysm. Electronically Signed   By: Nelson Chimes M.D.   On: 04/06/2022 15:39     ASSESSMENT/PLAN:  This is a very pleasant 71 year old Caucasian female diagnosed with stage IV (T2 a, N2, M1 B) non-small cell lung cancer, adenocarcinoma.  She presented with a left upper lobe lung mass in addition to left hilar and precarinal metastatic adenopathy as well as a small left lower lobe pulmonary nodule and a solitary left frontal brain metastasis.  This was diagnosed in July 2022.  Her molecular studies show that she is positive for K-ras G12C mutation and her PD-L1 expression is negative.  She completed SRS to the solitary brain metastasis under the care of Dr. Tammi Klippel which was completed on 06/23/2021.  She had a repeat brain MRI performed on 09/15/2021 which shows a new 1 mm and 3 mm metastatic brain lesions.  She received SRS on 09/28/2021.  She completed concurrent chemoradiation for the locally advanced disease in the chest with carboplatin for an AUC of 2 and paclitaxel 45 mg per metered squared.  She is status post 6 cycles.  She had been tolerating treatment well without any concerning adverse side effects.    The patient is currently undergoing systemic chemotherapy with carboplatin AUC 5, Alimta 500 mg per metered squared, Keytruda 20 mg IV every 3 weeks.  She is status post 11 cycles.  She started maintenance alimta and Keytruda starting from cycle #5.    Labs were reviewed.  Recommend that she proceed with cycle #12 today scheduled.  We will see her back for follow-up visit in 3 weeks for evaluation and repeat blood work before starting cycle #13.  The patient was advised to call immediately if she has any concerning symptoms in the interval. The patient voices understanding of current  disease status and treatment options and is in agreement with the current care plan. All questions were answered. The patient knows to call the clinic with any problems, questions or concerns. We can certainly see the patient much sooner if necessary           No orders of the defined types were placed in this encounter.    I spent {CHL ONC TIME VISIT - YBFXO:[id number]} counseling the patient face to face. The total time spent in the appointment was {CHL ONC TIME VISIT - YOKHT:[id number]}.  Christyann Manolis L Tiron Suski, PA-C 05/04/22

## 2022-05-10 ENCOUNTER — Other Ambulatory Visit: Payer: Self-pay | Admitting: Physician Assistant

## 2022-05-10 ENCOUNTER — Inpatient Hospital Stay: Payer: Medicare HMO

## 2022-05-10 ENCOUNTER — Inpatient Hospital Stay: Payer: Medicare HMO | Admitting: Physician Assistant

## 2022-05-11 NOTE — Progress Notes (Signed)
[facility] OFFICE PROGRESS NOTE  [name], MD [place] [address]  DIAGNOSIS: Stage IV (T2 a, N2, M1b) non-small cell lung cancer, adenocarcinoma presented with left upper lobe lung mass in addition to left hilar and precarinal metastatic adenopathy and small left lower lobe pulmonary nodule in addition to solitary left frontal brain metastasis diagnosed in July 2022.   Molecular studies by [facility] showed  Positive KRAS G12C mutation PD-L1 expression was less than 1%  PRIOR THERAPY: 1) Status post SRS to the solitary brain metastasis under the care of Dr. Tammi Klippel. Completed on 06/23/21 2)  Treatment for the locally advanced disease in the chest with carboplatin for AUC of 2 and paclitaxel 45 Mg/M2.  Status post 6 cycles.  Last dose was given 07/25/2021. 3) SRS to the additional metastatic brain lesions on 09/28/22  CURRENT THERAPY: Systemic chemotherapy with carboplatin for AUC of 5, Alimta 500 Mg/M2 and Keytruda 200 Mg IV every 3 weeks.  First dose August 31, 2021. Status post 11 cycles. Starting from cycle #5, she will start Alimta and keytruda maintenance IV every 3 weeks.   INTERVAL HISTORY: Jill [name] 71 y.o. female returns to the clinic today for a follow-up visit.  The patient is feeling fairly well today without any concerning complaints.  The patient is currently undergoing maintenance chemotherapy and immunotherapy.  She is tolerating it fairly well besides fatigue.  She denies any fever or chills.  She reports intermittent night sweats secondary to warm sleeping environment. She reports weight loss because it is "too hot to eat". She follows closely with radiation oncology for her history of metastatic disease to the brain.  She reports her dyspnea exertion with certain activities. She thinks this may have gotten a little worse. She coughs clear phlegm. She still smokes 1 pack of cigarettes per day. She also does not use her  inhalers prescribed by pulmonologist, Dr. Valeta Harms for her severe COPD.  She denies any chest pain or hemoptysis.  Denies any nausea, vomiting, diarrhea, or constipation.  Denies any rashes or skin changes. She sometimes has discharge from her eyes. She reports allergies. She is here today for evaluation and repeat blood work before starting cycle #12.  MEDICAL HISTORY: Past Medical History:  Diagnosis Date   Anxiety    ON PAXIL, XANAX   AVM (arteriovenous malformation) brain    s/p stent/coil   Blood transfusion    x 2   Cancer (HCC)    Left lung   COPD (chronic obstructive pulmonary disease) (HCC)    no inhaler, no oxygen   Coronary artery disease    Prior inferior MI with stent to RCA, s/p CABG in 2008   Depression    Dizziness    Dyspnea    with exertion, no oxygen   Fatigue    Foot injury 06/01/2017   right - RESOLVED, no longer an issue per patient 05/18/21   GERD (gastroesophageal reflux disease)    Headache(784.0)    UNRUPTURED CEREBRAL ANEURYSM   Hyperlipidemia    Hypertension    Hypothyroidism    Infected cyst of skin 09/18/2013   Left knee pain 06/05/2012   Lung mass    Left lung   Myocardial infarction ([facility])    Normal nuclear stress test Ju;y 2012   No ischemia. EF 70%; fixed defect involving septum, inferoseptal and inferior wall   Pain, dental 02/06/2017   Poor dental hygiene    Retroperitoneal bleeding  Following cardiac cath   Tobacco abuse    Urine discoloration 09/18/2013   Urine incontinence 07/06/2020   UTI (lower urinary tract infection) 10/16/2013    ALLERGIES:  is allergic to varenicline, ace inhibitors, prednisone, betalin 12 [vitamin b12], penicillins, and sulfa drugs cross reactors.  MEDICATIONS:  Current Outpatient Medications  Medication Sig Dispense Refill   acetaminophen (TYLENOL) 500 MG tablet Take 500 mg by mouth every 6 (six) hours as needed.     amLODipine (NORVASC) 10 MG tablet Take 1 tablet by mouth once daily 90 tablet 1    amoxicillin-clavulanate (AUGMENTIN) 875-125 MG tablet Take 1 tablet by mouth 2 (two) times daily. (Patient not taking: Reported on 10/26/2021) 12 tablet 0   aspirin EC 81 MG tablet Take 1 tablet (81 mg total) by mouth daily. 90 tablet 2   atorvastatin (LIPITOR) 40 MG tablet Take 1 tablet (40 mg total) by mouth daily. 90 tablet 1   Cholecalciferol (VITAMIN D3) 250 MCG (10000 UT) capsule Take 10,000 mcg by mouth daily.     escitalopram (LEXAPRO) 10 MG tablet Take 1 tablet by mouth once daily 90 tablet 1   folic acid (FOLVITE) 1 MG tablet Take 1 tablet by mouth once daily 30 tablet 0   levothyroxine (SYNTHROID) 137 MCG tablet TAKE 1 TABLET BY MOUTH ONCE DAILY BEFORE BREAKFAST 30 tablet 2   losartan (COZAAR) 100 MG tablet Take 1 tablet by mouth once daily 90 tablet 1   metoprolol succinate (TOPROL-XL) 50 MG 24 hr tablet TAKE 1 TABLET BY MOUTH ONCE DAILY WITH A MEAL OR  IMMEDIATELY  FOLLOWING 90 tablet 0   Multiple Vitamins-Minerals (MULTIVITAMIN WITH MINERALS) tablet Take 1 tablet by mouth daily.     nitroGLYCERIN (NITROSTAT) 0.4 MG SL tablet Place 1 tablet (0.4 mg total) under the tongue every 5 (five) minutes as needed. For chest pain (Patient not taking: Reported on 10/26/2021) 25 tablet 6   prochlorperazine (COMPAZINE) 10 MG tablet Take 1 tablet (10 mg total) by mouth every 6 (six) hours as needed for nausea or vomiting. (Patient not taking: Reported on 10/26/2021) 30 tablet 0   Vitamin A 2400 MCG (8000 UT) TABS Take 2,400 mg by mouth daily.     vitamin B-12 (CYANOCOBALAMIN) 1000 MCG tablet Take 1,000 mcg by mouth daily. (Patient not taking: Reported on 10/26/2021)     No current facility-administered medications for this visit.    SURGICAL HISTORY:  Past Surgical History:  Procedure Laterality Date   CARDIAC CATHETERIZATION  01/02/2007   IT REVEALS MILD INFERIOR WALL HYPOKINESIS. THE EJECTION FRACTION IS AROUND 50%   COLONOSCOPY     CORONARY ARTERY BYPASS GRAFT  11/06/2006   LIMA to LAD,  SVG to DX, SVG to LCX & SVG to OM 1 & 2, and SVG to PD   CORONARY STENT PLACEMENT     Remote past stent to RCA   EYE SURGERY Right    cataracts removed   HEMORRHOID SURGERY  11/07/1987   UPPER GI ENDOSCOPY     VENTRICULOSTOMY  10/06/2011   Procedure: VENTRICULOSTOMY;  Surgeon: Winfield Cunas;  Location: [facility];  Service: Neurosurgery;  Laterality: Right;  Insertion of Ventriculostomy Catheter   VIDEO BRONCHOSCOPY WITH ENDOBRONCHIAL NAVIGATION Left 05/20/2021   Procedure: VIDEO BRONCHOSCOPY WITH ENDOBRONCHIAL NAVIGATION;  Surgeon: Garner Nash, DO;  Location: [facility];  Service: Pulmonary;  Laterality: Left;   VIDEO BRONCHOSCOPY WITH ENDOBRONCHIAL ULTRASOUND Bilateral 05/20/2021   Procedure: VIDEO BRONCHOSCOPY WITH ENDOBRONCHIAL ULTRASOUND;  Surgeon: June Leap  L, DO;  Location: [facility];  Service: Pulmonary;  Laterality: Bilateral;   WISDOM TOOTH EXTRACTION      REVIEW OF SYSTEMS:   Constitutional: Positive for fatigue. Negative for chills and fever. HENT: Negative for mouth sores, nosebleeds, sore throat and trouble swallowing.   Eyes: Negative for eye problems and icterus.  Respiratory: Positive for productive cough (clear phlegm).  Positive for shortness of breath with exertion.  Negative for hemoptysis and wheezing.   Cardiovascular: Negative for chest pain and leg swelling.  Gastrointestinal: Negative for abdominal pain, diarrhea, constipation, nausea and vomiting.  Genitourinary: Negative for bladder incontinence, difficulty urinating, dysuria, frequency and hematuria.   Musculoskeletal: Negative for back pain, gait problem, neck pain and neck stiffness.  Skin: Negative for itching and rash.  Neurological: Negative for dizziness, extremity weakness, gait problem, light-headedness and seizures.  Hematological: Negative for adenopathy. Does not bruise/bleed easily.  Psychiatric/Behavioral: Negative for confusion, depression and sleep disturbance. The patient is not  nervous/anxious.    PHYSICAL EXAMINATION:  Blood pressure 122/74, pulse 76, temperature (!) 97.4 F (36.3 C), temperature source Tympanic, resp. rate 18, height '5\' 7"'  (1.702 m), weight 139 lb 9.6 oz (63.3 kg), SpO2 92 %.  ECOG PERFORMANCE STATUS: 1  Physical Exam  Constitutional: Oriented to person, place, and time and well-developed, well-nourished, and in no distress.   HENT:  Head: Normocephalic and atraumatic.  Mouth/Throat: Oropharynx is clear and moist. No oropharyngeal exudate. Poor dentition with multiple missing teeth and dental caries. Has a small lesion on her right upper gum line.  Eyes: Conjunctivae are normal. Right eye exhibits no discharge. Left eye exhibits no discharge. No scleral icterus.  Neck: Normal range of motion. Neck supple.  Cardiovascular: Normal rate, regular rhythm, normal heart sounds and intact distal pulses.   Pulmonary/Chest: Effort normal.  Decreased breath sounds in all lung fields.  No respiratory distress. No wheezes. No rales.  Abdominal: Soft. Bowel sounds are normal. Exhibits no distension and no mass. There is no tenderness.  Musculoskeletal: Normal range of motion. Exhibits no edema.  Lymphadenopathy:    No cervical adenopathy.  Neurological: Alert and oriented to person, place, and time. Exhibits muscle wasting. tone. Gait normal. Coordination normal.  Skin: Skin is warm and dry. No rash noted. Not diaphoretic. No erythema. No pallor.  Psychiatric: Mood, memory and judgment normal.  Vitals reviewed.  LABORATORY DATA: Lab Results  Component Value Date   WBC 5.7 05/17/2022   HGB 11.9 (L) 05/17/2022   HCT 36.0 05/17/2022   MCV 103.7 (H) 05/17/2022   PLT 216 05/17/2022      Chemistry      Component Value Date/Time   NA 141 04/19/2022 0921   NA 141 04/12/2021 1356   K 3.8 04/19/2022 0921   CL 106 04/19/2022 0921   CO2 29 04/19/2022 0921   BUN 9 04/19/2022 0921   BUN 13 04/12/2021 1356   CREATININE 0.97 04/19/2022 0921   CREATININE  0.99 08/29/2016 1130      Component Value Date/Time   CALCIUM 9.6 04/19/2022 0921   ALKPHOS 80 04/19/2022 0921   AST 31 04/19/2022 0921   ALT 17 04/19/2022 0921   BILITOT 0.3 04/19/2022 0921       RADIOGRAPHIC STUDIES:  CT Chest W Contrast  Result Date: 04/18/2022 CLINICAL DATA:  Primary Cancer Type: Lung Imaging Indication: Assess response to therapy Interval therapy since last imaging? Yes Initial Cancer Diagnosis Date: 05/20/2021; Established by: Biopsy-proven Detailed Pathology: Stage IV non-small cell lung cancer, adenocarcinoma.  Primary Tumor location:  Left upper lobe. Brain metastasis. Surgeries: CABG 2008. Coronary stent. Chemotherapy: Yes; Ongoing? Yes; Most recent administration: 03/30/2022 Immunotherapy?  Yes; Type: Keytruda; Ongoing? Yes Radiation therapy? Yes Date Range: 09/28/2021; Target: Left occipatal and temporal metastases. Date Range: 06/23/2021; Target: Left frontal brain metastasis Date Range: 06/15/2021 - 08/01/2021; Target: Left lung * Tracking Code: BO * EXAM: CT CHEST, ABDOMEN AND PELVIS WITH CONTRAST TECHNIQUE: Multidetector CT imaging of the chest was performed during intravenous contrast administration. RADIATION DOSE REDUCTION: This exam was performed according to the departmental dose-optimization program which includes automated exposure control, adjustment of the mA and/or kV according to patient size and/or use of iterative reconstruction technique. CONTRAST:  164m OMNIPAQUE IOHEXOL 300 MG/ML  SOLN COMPARISON:  Most recent CT chest, abdomen and pelvis 01/16/2022. 05/31/2021 PET-CT. FINDINGS: CT CHEST FINDINGS Cardiovascular: Aortic atherosclerosis. Normal heart size. Three-vessel coronary artery calcifications status post median sternotomy and CABG. No pericardial effusion. Mediastinum/Nodes: No enlarged mediastinal, hilar, or axillary lymph nodes. Thyroid gland, trachea, and esophagus demonstrate no significant findings. Lungs/Pleura: Severe centrilobular and  paraseptal emphysema. Unchanged, spiculated mass of the posterior left upper lobe measuring 3.0 x 2.1 cm (series 4, image 62). A previously noted subsolid nodule of the left lung base is resolved (series 4, image 128). Otherwise unchanged small pulmonary nodules, including a 0.4 cm left upper lobe nodule (series 4, image 51) and a 0.4 cm fissural nodule of the superior segment left lower lobe (series 4, image 77). No pleural effusion or pneumothorax. Musculoskeletal: No chest wall mass or suspicious osseous lesions identified. CT ABDOMEN PELVIS FINDINGS Hepatobiliary: No solid liver abnormality is seen. Unchanged subcentimeter low-attenuation lesions of the right lobe of the liver, measuring up to 0.6 cm (series 2, image 72, 60). No significant change in a cystic lesion in the porta hepatis measuring approximately 3.1 x 1.6 cm, possibly a choledochal cyst (series 2, image 63). No gallstones, gallbladder wall thickening, or biliary dilatation. Pancreas: Unremarkable. No pancreatic ductal dilatation or surrounding inflammatory changes. Spleen: Normal in size without significant abnormality. Adrenals/Urinary Tract: Adrenal glands are unremarkable. Kidneys are normal, without renal calculi, solid lesion, or hydronephrosis. Bladder is unremarkable. Stomach/Bowel: Small diverticulum of the gastric fundus (series 2, image 59). Stomach is otherwise within normal limits. Appendix appears normal. No evidence of bowel wall thickening, distention, or inflammatory changes. Vascular/Lymphatic: Aortic atherosclerosis. No enlarged abdominal or pelvic lymph nodes. Reproductive: No mass or other abnormality. Other: No abdominal wall hernia or abnormality. No ascites. Musculoskeletal: No acute osseous findings. IMPRESSION: 1. Unchanged, spiculated mass of the posterior left upper lobe, consistent with treated primary lung malignancy. 2. Interval resolution of a previously seen nonspecific subsolid nodule of the left lung base. Other  small nodules of the left lung are unchanged. No new nodules. 3. No evidence of lymphadenopathy or metastatic disease in the abdomen or pelvis. 4. No significant change in a cystic lesion in the porta hepatis measuring approximately 3.1 x 1.6 cm, possibly a choledochal cyst. Attention on follow-up. 5. Severe emphysema. 6. Coronary artery disease. Aortic Atherosclerosis (ICD10-I70.0) and Emphysema (ICD10-J43.9). Electronically Signed   By: ADelanna AhmadiM.D.   On: 04/18/2022 11:18   CT Abdomen Pelvis W Contrast  Result Date: 04/18/2022 CLINICAL DATA:  Primary Cancer Type: Lung Imaging Indication: Assess response to therapy Interval therapy since last imaging? Yes Initial Cancer Diagnosis Date: 05/20/2021; Established by: Biopsy-proven Detailed Pathology: Stage IV non-small cell lung cancer, adenocarcinoma. Primary Tumor location:  Left upper lobe. Brain metastasis. Surgeries: CABG 2008. Coronary  stent. Chemotherapy: Yes; Ongoing? Yes; Most recent administration: 03/30/2022 Immunotherapy?  Yes; Type: Keytruda; Ongoing? Yes Radiation therapy? Yes Date Range: 09/28/2021; Target: Left occipatal and temporal metastases. Date Range: 06/23/2021; Target: Left frontal brain metastasis Date Range: 06/15/2021 - 08/01/2021; Target: Left lung * Tracking Code: BO * EXAM: CT CHEST, ABDOMEN AND PELVIS WITH CONTRAST TECHNIQUE: Multidetector CT imaging of the chest was performed during intravenous contrast administration. RADIATION DOSE REDUCTION: This exam was performed according to the departmental dose-optimization program which includes automated exposure control, adjustment of the mA and/or kV according to patient size and/or use of iterative reconstruction technique. CONTRAST:  160m OMNIPAQUE IOHEXOL 300 MG/ML  SOLN COMPARISON:  Most recent CT chest, abdomen and pelvis 01/16/2022. 05/31/2021 PET-CT. FINDINGS: CT CHEST FINDINGS Cardiovascular: Aortic atherosclerosis. Normal heart size. Three-vessel coronary artery  calcifications status post median sternotomy and CABG. No pericardial effusion. Mediastinum/Nodes: No enlarged mediastinal, hilar, or axillary lymph nodes. Thyroid gland, trachea, and esophagus demonstrate no significant findings. Lungs/Pleura: Severe centrilobular and paraseptal emphysema. Unchanged, spiculated mass of the posterior left upper lobe measuring 3.0 x 2.1 cm (series 4, image 62). A previously noted subsolid nodule of the left lung base is resolved (series 4, image 128). Otherwise unchanged small pulmonary nodules, including a 0.4 cm left upper lobe nodule (series 4, image 51) and a 0.4 cm fissural nodule of the superior segment left lower lobe (series 4, image 77). No pleural effusion or pneumothorax. Musculoskeletal: No chest wall mass or suspicious osseous lesions identified. CT ABDOMEN PELVIS FINDINGS Hepatobiliary: No solid liver abnormality is seen. Unchanged subcentimeter low-attenuation lesions of the right lobe of the liver, measuring up to 0.6 cm (series 2, image 72, 60). No significant change in a cystic lesion in the porta hepatis measuring approximately 3.1 x 1.6 cm, possibly a choledochal cyst (series 2, image 63). No gallstones, gallbladder wall thickening, or biliary dilatation. Pancreas: Unremarkable. No pancreatic ductal dilatation or surrounding inflammatory changes. Spleen: Normal in size without significant abnormality. Adrenals/Urinary Tract: Adrenal glands are unremarkable. Kidneys are normal, without renal calculi, solid lesion, or hydronephrosis. Bladder is unremarkable. Stomach/Bowel: Small diverticulum of the gastric fundus (series 2, image 59). Stomach is otherwise within normal limits. Appendix appears normal. No evidence of bowel wall thickening, distention, or inflammatory changes. Vascular/Lymphatic: Aortic atherosclerosis. No enlarged abdominal or pelvic lymph nodes. Reproductive: No mass or other abnormality. Other: No abdominal wall hernia or abnormality. No ascites.  Musculoskeletal: No acute osseous findings. IMPRESSION: 1. Unchanged, spiculated mass of the posterior left upper lobe, consistent with treated primary lung malignancy. 2. Interval resolution of a previously seen nonspecific subsolid nodule of the left lung base. Other small nodules of the left lung are unchanged. No new nodules. 3. No evidence of lymphadenopathy or metastatic disease in the abdomen or pelvis. 4. No significant change in a cystic lesion in the porta hepatis measuring approximately 3.1 x 1.6 cm, possibly a choledochal cyst. Attention on follow-up. 5. Severe emphysema. 6. Coronary artery disease. Aortic Atherosclerosis (ICD10-I70.0) and Emphysema (ICD10-J43.9). Electronically Signed   By: ADelanna AhmadiM.D.   On: 04/18/2022 11:18     ASSESSMENT/PLAN:  This is a very pleasant 71year old Caucasian female diagnosed with stage IV (T2 a, N2, M1 B) non-small cell lung cancer, adenocarcinoma.  She presented with a left upper lobe lung mass in addition to left hilar and precarinal metastatic adenopathy as well as a small left lower lobe pulmonary nodule and a solitary left frontal brain metastasis.  This was diagnosed in July 2022.  Her  molecular studies show that she is positive for K-ras G12C mutation and her PD-L1 expression is negative.  She completed SRS to the solitary brain metastasis under the care of Dr. Tammi Klippel which was completed on 06/23/2021.  She had a repeat brain MRI performed on 09/15/2021 which shows a new 1 mm and 3 mm metastatic brain lesions.  She received SRS on 09/28/2021.  She completed concurrent chemoradiation for the locally advanced disease in the chest with carboplatin for an AUC of 2 and paclitaxel 45 mg per metered squared.  She is status post 6 cycles.  She had been tolerating treatment well without any concerning adverse side effects.    The patient is currently undergoing systemic chemotherapy with carboplatin AUC 5, Alimta 500 mg per metered squared, Keytruda 20 mg  IV every 3 weeks.  She is status post 11 cycles.  She started maintenance alimta and Keytruda starting from cycle #5.    Labs were reviewed.  Recommend that she proceed with cycle #12 today scheduled.  We will see her back for follow-up visit in 3 weeks for evaluation and repeat blood work before starting cycle #13.  I offered a chest x-ray today since she feels like her shortness of breath and cough may be a little worse. She declined. She continues to smoke. Discussed she may have improvement in her breathing/coughing with less irritants if she quits smoking. Additionally, she may find benefit if she is compliant with her inhalers prescribed by pulmonology for her COPD. Her most recent CT scan from 6/12 noted severe emphysema.   The patient was advised to call immediately if she has any concerning symptoms in the interval. The patient voices understanding of current disease status and treatment options and is in agreement with the current care plan. All questions were answered. The patient knows to call the clinic with any problems, questions or concerns. We can certainly see the patient much sooner if necessary     No orders of the defined types were placed in this encounter.     The total time spent in the appointment was 20-29 minutes.   Aaleigha Bozza L Swathi Dauphin, PA-C 05/17/22

## 2022-05-13 ENCOUNTER — Other Ambulatory Visit: Payer: Self-pay | Admitting: Family Medicine

## 2022-05-17 ENCOUNTER — Inpatient Hospital Stay (HOSPITAL_BASED_OUTPATIENT_CLINIC_OR_DEPARTMENT_OTHER): Payer: Medicare HMO | Admitting: Physician Assistant

## 2022-05-17 ENCOUNTER — Other Ambulatory Visit: Payer: Self-pay | Admitting: Internal Medicine

## 2022-05-17 ENCOUNTER — Inpatient Hospital Stay: Payer: Medicare HMO | Attending: Internal Medicine

## 2022-05-17 ENCOUNTER — Other Ambulatory Visit: Payer: Self-pay

## 2022-05-17 ENCOUNTER — Inpatient Hospital Stay: Payer: Medicare HMO

## 2022-05-17 VITALS — BP 122/74 | HR 76 | Temp 97.4°F | Resp 18 | Ht 67.0 in | Wt 139.6 lb

## 2022-05-17 DIAGNOSIS — C7931 Secondary malignant neoplasm of brain: Secondary | ICD-10-CM | POA: Diagnosis not present

## 2022-05-17 DIAGNOSIS — Z79890 Hormone replacement therapy (postmenopausal): Secondary | ICD-10-CM | POA: Diagnosis not present

## 2022-05-17 DIAGNOSIS — Z87440 Personal history of urinary (tract) infections: Secondary | ICD-10-CM | POA: Insufficient documentation

## 2022-05-17 DIAGNOSIS — Z882 Allergy status to sulfonamides status: Secondary | ICD-10-CM | POA: Diagnosis not present

## 2022-05-17 DIAGNOSIS — Z923 Personal history of irradiation: Secondary | ICD-10-CM | POA: Insufficient documentation

## 2022-05-17 DIAGNOSIS — Z880 Allergy status to penicillin: Secondary | ICD-10-CM | POA: Insufficient documentation

## 2022-05-17 DIAGNOSIS — C779 Secondary and unspecified malignant neoplasm of lymph node, unspecified: Secondary | ICD-10-CM | POA: Diagnosis not present

## 2022-05-17 DIAGNOSIS — Z5111 Encounter for antineoplastic chemotherapy: Secondary | ICD-10-CM | POA: Diagnosis not present

## 2022-05-17 DIAGNOSIS — J432 Centrilobular emphysema: Secondary | ICD-10-CM | POA: Diagnosis not present

## 2022-05-17 DIAGNOSIS — C3412 Malignant neoplasm of upper lobe, left bronchus or lung: Secondary | ICD-10-CM

## 2022-05-17 DIAGNOSIS — I700 Atherosclerosis of aorta: Secondary | ICD-10-CM | POA: Diagnosis not present

## 2022-05-17 DIAGNOSIS — F17210 Nicotine dependence, cigarettes, uncomplicated: Secondary | ICD-10-CM | POA: Diagnosis not present

## 2022-05-17 DIAGNOSIS — Z8719 Personal history of other diseases of the digestive system: Secondary | ICD-10-CM | POA: Diagnosis not present

## 2022-05-17 DIAGNOSIS — R61 Generalized hyperhidrosis: Secondary | ICD-10-CM | POA: Diagnosis not present

## 2022-05-17 DIAGNOSIS — R634 Abnormal weight loss: Secondary | ICD-10-CM | POA: Insufficient documentation

## 2022-05-17 DIAGNOSIS — I252 Old myocardial infarction: Secondary | ICD-10-CM | POA: Insufficient documentation

## 2022-05-17 DIAGNOSIS — E039 Hypothyroidism, unspecified: Secondary | ICD-10-CM | POA: Diagnosis not present

## 2022-05-17 DIAGNOSIS — E785 Hyperlipidemia, unspecified: Secondary | ICD-10-CM | POA: Insufficient documentation

## 2022-05-17 DIAGNOSIS — Z79899 Other long term (current) drug therapy: Secondary | ICD-10-CM | POA: Insufficient documentation

## 2022-05-17 DIAGNOSIS — Z888 Allergy status to other drugs, medicaments and biological substances status: Secondary | ICD-10-CM | POA: Insufficient documentation

## 2022-05-17 DIAGNOSIS — Z5112 Encounter for antineoplastic immunotherapy: Secondary | ICD-10-CM | POA: Diagnosis not present

## 2022-05-17 LAB — CBC WITH DIFFERENTIAL (CANCER CENTER ONLY)
Abs Immature Granulocytes: 0.01 10*3/uL (ref 0.00–0.07)
Basophils Absolute: 0.1 10*3/uL (ref 0.0–0.1)
Basophils Relative: 1 %
Eosinophils Absolute: 0.2 10*3/uL (ref 0.0–0.5)
Eosinophils Relative: 4 %
HCT: 36 % (ref 36.0–46.0)
Hemoglobin: 11.9 g/dL — ABNORMAL LOW (ref 12.0–15.0)
Immature Granulocytes: 0 %
Lymphocytes Relative: 28 %
Lymphs Abs: 1.6 10*3/uL (ref 0.7–4.0)
MCH: 34.3 pg — ABNORMAL HIGH (ref 26.0–34.0)
MCHC: 33.1 g/dL (ref 30.0–36.0)
MCV: 103.7 fL — ABNORMAL HIGH (ref 80.0–100.0)
Monocytes Absolute: 0.6 10*3/uL (ref 0.1–1.0)
Monocytes Relative: 11 %
Neutro Abs: 3.2 10*3/uL (ref 1.7–7.7)
Neutrophils Relative %: 56 %
Platelet Count: 216 10*3/uL (ref 150–400)
RBC: 3.47 MIL/uL — ABNORMAL LOW (ref 3.87–5.11)
RDW: 14.6 % (ref 11.5–15.5)
WBC Count: 5.7 10*3/uL (ref 4.0–10.5)
nRBC: 0 % (ref 0.0–0.2)

## 2022-05-17 LAB — CMP (CANCER CENTER ONLY)
ALT: 16 U/L (ref 0–44)
AST: 25 U/L (ref 15–41)
Albumin: 4 g/dL (ref 3.5–5.0)
Alkaline Phosphatase: 68 U/L (ref 38–126)
Anion gap: 7 (ref 5–15)
BUN: 15 mg/dL (ref 8–23)
CO2: 28 mmol/L (ref 22–32)
Calcium: 9.2 mg/dL (ref 8.9–10.3)
Chloride: 105 mmol/L (ref 98–111)
Creatinine: 1.12 mg/dL — ABNORMAL HIGH (ref 0.44–1.00)
GFR, Estimated: 52 mL/min — ABNORMAL LOW (ref >60)
Glucose, Bld: 101 mg/dL — ABNORMAL HIGH (ref 70–99)
Potassium: 3.8 mmol/L (ref 3.5–5.1)
Sodium: 140 mmol/L (ref 135–145)
Total Bilirubin: 0.3 mg/dL (ref 0.3–1.2)
Total Protein: 7.3 g/dL (ref 6.5–8.1)

## 2022-05-17 MED ORDER — PROCHLORPERAZINE MALEATE 10 MG PO TABS
10.0000 mg | ORAL_TABLET | Freq: Once | ORAL | Status: AC
  Administered 2022-05-17: 10 mg via ORAL
  Filled 2022-05-17: qty 1

## 2022-05-17 MED ORDER — SODIUM CHLORIDE 0.9 % IV SOLN
200.0000 mg | Freq: Once | INTRAVENOUS | Status: AC
  Administered 2022-05-17: 200 mg via INTRAVENOUS
  Filled 2022-05-17: qty 200

## 2022-05-17 MED ORDER — DIPHENHYDRAMINE HCL 25 MG PO CAPS
25.0000 mg | ORAL_CAPSULE | Freq: Once | ORAL | Status: AC
  Administered 2022-05-17: 25 mg via ORAL
  Filled 2022-05-17: qty 1

## 2022-05-17 MED ORDER — SODIUM CHLORIDE 0.9 % IV SOLN: Freq: Once | INTRAVENOUS | Status: AC

## 2022-05-17 MED ORDER — SODIUM CHLORIDE 0.9 % IV SOLN
500.0000 mg/m2 | Freq: Once | INTRAVENOUS | Status: AC
  Administered 2022-05-17: 900 mg via INTRAVENOUS
  Filled 2022-05-17: qty 20

## 2022-05-17 MED ORDER — CYANOCOBALAMIN 1000 MCG/ML IJ SOLN
1000.0000 ug | Freq: Once | INTRAMUSCULAR | Status: AC
  Administered 2022-05-17: 1000 ug via INTRAMUSCULAR
  Filled 2022-05-17: qty 1

## 2022-05-19 ENCOUNTER — Other Ambulatory Visit: Payer: Self-pay | Admitting: Family Medicine

## 2022-05-20 ENCOUNTER — Other Ambulatory Visit: Payer: Self-pay | Admitting: Family Medicine

## 2022-05-29 ENCOUNTER — Other Ambulatory Visit: Payer: Self-pay

## 2022-05-30 ENCOUNTER — Other Ambulatory Visit: Payer: Self-pay | Admitting: Family Medicine

## 2022-05-30 ENCOUNTER — Other Ambulatory Visit: Payer: Self-pay

## 2022-05-30 DIAGNOSIS — I10 Essential (primary) hypertension: Secondary | ICD-10-CM

## 2022-06-01 ENCOUNTER — Other Ambulatory Visit: Payer: Medicare HMO

## 2022-06-01 ENCOUNTER — Ambulatory Visit: Payer: Medicare HMO | Admitting: Internal Medicine

## 2022-06-01 ENCOUNTER — Ambulatory Visit: Payer: Medicare HMO

## 2022-06-07 ENCOUNTER — Inpatient Hospital Stay: Payer: Medicare HMO | Attending: Internal Medicine

## 2022-06-07 ENCOUNTER — Inpatient Hospital Stay: Payer: Medicare HMO

## 2022-06-07 ENCOUNTER — Inpatient Hospital Stay (HOSPITAL_BASED_OUTPATIENT_CLINIC_OR_DEPARTMENT_OTHER): Payer: Medicare HMO | Admitting: Internal Medicine

## 2022-06-07 ENCOUNTER — Other Ambulatory Visit: Payer: Self-pay

## 2022-06-07 VITALS — BP 135/57 | HR 76 | Temp 98.1°F | Resp 18 | Wt 141.3 lb

## 2022-06-07 DIAGNOSIS — I700 Atherosclerosis of aorta: Secondary | ICD-10-CM | POA: Diagnosis not present

## 2022-06-07 DIAGNOSIS — J449 Chronic obstructive pulmonary disease, unspecified: Secondary | ICD-10-CM | POA: Diagnosis not present

## 2022-06-07 DIAGNOSIS — C7931 Secondary malignant neoplasm of brain: Secondary | ICD-10-CM | POA: Insufficient documentation

## 2022-06-07 DIAGNOSIS — R0989 Other specified symptoms and signs involving the circulatory and respiratory systems: Secondary | ICD-10-CM | POA: Diagnosis not present

## 2022-06-07 DIAGNOSIS — Z923 Personal history of irradiation: Secondary | ICD-10-CM | POA: Insufficient documentation

## 2022-06-07 DIAGNOSIS — K760 Fatty (change of) liver, not elsewhere classified: Secondary | ICD-10-CM | POA: Insufficient documentation

## 2022-06-07 DIAGNOSIS — E785 Hyperlipidemia, unspecified: Secondary | ICD-10-CM | POA: Insufficient documentation

## 2022-06-07 DIAGNOSIS — R5383 Other fatigue: Secondary | ICD-10-CM | POA: Insufficient documentation

## 2022-06-07 DIAGNOSIS — E039 Hypothyroidism, unspecified: Secondary | ICD-10-CM | POA: Insufficient documentation

## 2022-06-07 DIAGNOSIS — Z87440 Personal history of urinary (tract) infections: Secondary | ICD-10-CM | POA: Insufficient documentation

## 2022-06-07 DIAGNOSIS — Z880 Allergy status to penicillin: Secondary | ICD-10-CM | POA: Diagnosis not present

## 2022-06-07 DIAGNOSIS — Z8719 Personal history of other diseases of the digestive system: Secondary | ICD-10-CM | POA: Insufficient documentation

## 2022-06-07 DIAGNOSIS — C3490 Malignant neoplasm of unspecified part of unspecified bronchus or lung: Secondary | ICD-10-CM

## 2022-06-07 DIAGNOSIS — K449 Diaphragmatic hernia without obstruction or gangrene: Secondary | ICD-10-CM | POA: Insufficient documentation

## 2022-06-07 DIAGNOSIS — J432 Centrilobular emphysema: Secondary | ICD-10-CM | POA: Insufficient documentation

## 2022-06-07 DIAGNOSIS — Z79890 Hormone replacement therapy (postmenopausal): Secondary | ICD-10-CM | POA: Diagnosis not present

## 2022-06-07 DIAGNOSIS — I119 Hypertensive heart disease without heart failure: Secondary | ICD-10-CM | POA: Insufficient documentation

## 2022-06-07 DIAGNOSIS — Z79899 Other long term (current) drug therapy: Secondary | ICD-10-CM | POA: Insufficient documentation

## 2022-06-07 DIAGNOSIS — C3412 Malignant neoplasm of upper lobe, left bronchus or lung: Secondary | ICD-10-CM | POA: Diagnosis not present

## 2022-06-07 DIAGNOSIS — Z882 Allergy status to sulfonamides status: Secondary | ICD-10-CM | POA: Insufficient documentation

## 2022-06-07 DIAGNOSIS — K314 Gastric diverticulum: Secondary | ICD-10-CM | POA: Diagnosis not present

## 2022-06-07 DIAGNOSIS — I252 Old myocardial infarction: Secondary | ICD-10-CM | POA: Insufficient documentation

## 2022-06-07 DIAGNOSIS — Z5111 Encounter for antineoplastic chemotherapy: Secondary | ICD-10-CM | POA: Diagnosis not present

## 2022-06-07 DIAGNOSIS — Z888 Allergy status to other drugs, medicaments and biological substances status: Secondary | ICD-10-CM | POA: Diagnosis not present

## 2022-06-07 DIAGNOSIS — Z5112 Encounter for antineoplastic immunotherapy: Secondary | ICD-10-CM | POA: Diagnosis not present

## 2022-06-07 LAB — CMP (CANCER CENTER ONLY)
ALT: 16 U/L (ref 0–44)
AST: 24 U/L (ref 15–41)
Albumin: 4.1 g/dL (ref 3.5–5.0)
Alkaline Phosphatase: 76 U/L (ref 38–126)
Anion gap: 7 (ref 5–15)
BUN: 14 mg/dL (ref 8–23)
CO2: 29 mmol/L (ref 22–32)
Calcium: 9.4 mg/dL (ref 8.9–10.3)
Chloride: 105 mmol/L (ref 98–111)
Creatinine: 1 mg/dL (ref 0.44–1.00)
GFR, Estimated: 59 mL/min — ABNORMAL LOW (ref >60)
Glucose, Bld: 147 mg/dL — ABNORMAL HIGH (ref 70–99)
Potassium: 3.7 mmol/L (ref 3.5–5.1)
Sodium: 141 mmol/L (ref 135–145)
Total Bilirubin: 0.3 mg/dL (ref 0.3–1.2)
Total Protein: 7.5 g/dL (ref 6.5–8.1)

## 2022-06-07 LAB — CBC WITH DIFFERENTIAL (CANCER CENTER ONLY)
Abs Immature Granulocytes: 0.01 10*3/uL (ref 0.00–0.07)
Basophils Absolute: 0.1 10*3/uL (ref 0.0–0.1)
Basophils Relative: 2 %
Eosinophils Absolute: 0.2 10*3/uL (ref 0.0–0.5)
Eosinophils Relative: 4 %
HCT: 35.5 % — ABNORMAL LOW (ref 36.0–46.0)
Hemoglobin: 11.7 g/dL — ABNORMAL LOW (ref 12.0–15.0)
Immature Granulocytes: 0 %
Lymphocytes Relative: 29 %
Lymphs Abs: 1.5 10*3/uL (ref 0.7–4.0)
MCH: 34.4 pg — ABNORMAL HIGH (ref 26.0–34.0)
MCHC: 33 g/dL (ref 30.0–36.0)
MCV: 104.4 fL — ABNORMAL HIGH (ref 80.0–100.0)
Monocytes Absolute: 0.6 10*3/uL (ref 0.1–1.0)
Monocytes Relative: 11 %
Neutro Abs: 2.8 10*3/uL (ref 1.7–7.7)
Neutrophils Relative %: 54 %
Platelet Count: 287 10*3/uL (ref 150–400)
RBC: 3.4 MIL/uL — ABNORMAL LOW (ref 3.87–5.11)
RDW: 14.8 % (ref 11.5–15.5)
WBC Count: 5.1 10*3/uL (ref 4.0–10.5)
nRBC: 0 % (ref 0.0–0.2)

## 2022-06-07 MED ORDER — SODIUM CHLORIDE 0.9 % IV SOLN
500.0000 mg/m2 | Freq: Once | INTRAVENOUS | Status: AC
  Administered 2022-06-07: 900 mg via INTRAVENOUS
  Filled 2022-06-07: qty 20

## 2022-06-07 MED ORDER — PROCHLORPERAZINE MALEATE 10 MG PO TABS
10.0000 mg | ORAL_TABLET | Freq: Once | ORAL | Status: AC
  Administered 2022-06-07: 10 mg via ORAL
  Filled 2022-06-07: qty 1

## 2022-06-07 MED ORDER — SODIUM CHLORIDE 0.9 % IV SOLN: Freq: Once | INTRAVENOUS | Status: AC

## 2022-06-07 MED ORDER — SODIUM CHLORIDE 0.9 % IV SOLN
200.0000 mg | Freq: Once | INTRAVENOUS | Status: AC
  Administered 2022-06-07: 200 mg via INTRAVENOUS
  Filled 2022-06-07: qty 200

## 2022-06-07 NOTE — Patient Instructions (Signed)
[facility]  Discharge Instructions: Thank you for choosing [facility] to provide your oncology and hematology care.   If you have a lab appointment with the [facility], please go directly to the [facility] and check in at the registration area.   Wear comfortable clothing and clothing appropriate for easy access to any Portacath or PICC line.   We strive to give you quality time with your provider. You may need to reschedule your appointment if you arrive late (15 or more minutes).  Arriving late affects you and other patients whose appointments are after yours.  Also, if you miss three or more appointments without notifying the office, you may be dismissed from the clinic at the provider's discretion.      For prescription refill requests, have your pharmacy contact our office and allow 72 hours for refills to be completed.    Today you received the following chemotherapy and/or immunotherapy agents: Keytruda and Alimta      To help prevent nausea and vomiting after your treatment, we encourage you to take your nausea medication as directed.  BELOW ARE SYMPTOMS THAT SHOULD BE REPORTED IMMEDIATELY: *FEVER GREATER THAN 100.4 F (38 C) OR HIGHER *CHILLS OR SWEATING *NAUSEA AND VOMITING THAT IS NOT CONTROLLED WITH YOUR NAUSEA MEDICATION *UNUSUAL SHORTNESS OF BREATH *UNUSUAL BRUISING OR BLEEDING *URINARY PROBLEMS (pain or burning when urinating, or frequent urination) *BOWEL PROBLEMS (unusual diarrhea, constipation, pain near the anus) TENDERNESS IN MOUTH AND THROAT WITH OR WITHOUT PRESENCE OF ULCERS (sore throat, sores in mouth, or a toothache) UNUSUAL RASH, SWELLING OR PAIN  UNUSUAL VAGINAL DISCHARGE OR ITCHING   Items with * indicate a potential emergency and should be followed up as soon as possible or go to the Emergency Department if any problems should occur.  Please show the CHEMOTHERAPY ALERT CARD or IMMUNOTHERAPY ALERT CARD at  check-in to the Emergency Department and triage nurse.  Should you have questions after your visit or need to cancel or reschedule your appointment, please contact [facility]  Dept: [phone]  and follow the prompts.  Office hours are 8:00 a.m. to 4:30 p.m. Monday - Friday. Please note that voicemails left after 4:00 p.m. may not be returned until the following business day.  We are closed weekends and major holidays. You have access to a nurse at all times for urgent questions. Please call the main number to the clinic Dept: [phone] and follow the prompts.   For any non-urgent questions, you may also contact your provider using MyChart. We now offer e-Visits for anyone 14 and older to request care online for non-urgent symptoms. For details visit [email or web address].   Also download the MyChart app! Go to the app store, search "MyChart", open the app, select [facility], and log in with your MyChart username and password.  Masks are optional in the cancer centers. If you would like for your care team to wear a mask while they are taking care of you, please let them know. For doctor visits, patients may have with them one support person who is at least 73 years old. At this time, visitors are not allowed in the infusion area.

## 2022-06-07 NOTE — Progress Notes (Signed)
[place] [phone]   [phone]  OFFICE PROGRESS NOTE  [name], MD [place] [address]  DIAGNOSIS: Stage IV (T2 a, N2, M1b) non-small cell lung cancer, adenocarcinoma presented with left upper lobe lung mass in addition to left hilar and precarinal metastatic adenopathy and small left lower lobe pulmonary nodule in addition to solitary left frontal brain metastasis diagnosed in July 2022.  Molecular studies by [facility] showed  Positive KRAS G12C mutation PD-L1 expression was less than 1%   PRIOR THERAPY:  1) Status post SRS to the solitary brain metastasis. 20 Treatment for the locally advanced disease in the chest with carboplatin for AUC of 2 and paclitaxel 45 Mg/M2.  Status post 6 cycles.  Last dose was given 07/25/2021.  CURRENT THERAPY: Systemic chemotherapy with carboplatin for AUC of 5, Alimta 500 Mg/M2 and Keytruda 200 Mg IV every 3 weeks.  First dose August 31, 2021.  Status post 12 cycles.  Starting from cycle #5 the patient will be on maintenance treatment with Alimta and Keytruda every 3 weeks.  INTERVAL HISTORY: Jill [name] 71 y.o. female returns to the clinic today for follow-up visit.  The patient is feeling fine today with no concerning complaints except for runny nose and increased tears in her eyes especially in the morning.  She denied having any current chest pain but has shortness of breath with exertion with no cough or hemoptysis.  She has no nausea, vomiting, diarrhea or constipation.  She has no headache or visual changes.  She continues to tolerate her treatment with maintenance Alimta and Keytruda fairly well.  The patient is here today for evaluation before starting cycle #13.  MEDICAL HISTORY: Past Medical History:  Diagnosis Date   Anxiety    ON PAXIL, XANAX   AVM (arteriovenous malformation) brain    s/p stent/coil   Blood transfusion    x 2   Cancer (HCC)    Left  lung   COPD (chronic obstructive pulmonary disease) (HCC)    no inhaler, no oxygen   Coronary artery disease    Prior inferior MI with stent to RCA, s/p CABG in 2008   Depression    Dizziness    Dyspnea    with exertion, no oxygen   Fatigue    Foot injury 06/01/2017   right - RESOLVED, no longer an issue per patient 05/18/21   GERD (gastroesophageal reflux disease)    Headache(784.0)    UNRUPTURED CEREBRAL ANEURYSM   Hyperlipidemia    Hypertension    Hypothyroidism    Infected cyst of skin 09/18/2013   Left knee pain 06/05/2012   Lung mass    Left lung   Myocardial infarction ([place])    Normal nuclear stress test Ju;y 2012   No ischemia. EF 70%; fixed defect involving septum, inferoseptal and inferior wall   Pain, dental 02/06/2017   Poor dental hygiene    Retroperitoneal bleeding    Following cardiac cath   Tobacco abuse    Urine discoloration 09/18/2013   Urine incontinence 07/06/2020   UTI (lower urinary tract infection) 10/16/2013    ALLERGIES:  is allergic to varenicline, ace inhibitors, prednisone, betalin 12 [vitamin b12], penicillins, and sulfa drugs cross reactors.  MEDICATIONS:  Current Outpatient Medications  Medication Sig Dispense Refill   acetaminophen (TYLENOL) 500 MG tablet Take 500 mg by mouth every 6 (six) hours as needed.     amLODipine (NORVASC) 10 MG  tablet Take 1 tablet by mouth once daily 90 tablet 1   amoxicillin-clavulanate (AUGMENTIN) 875-125 MG tablet Take 1 tablet by mouth 2 (two) times daily. (Patient not taking: Reported on 10/26/2021) 12 tablet 0   aspirin EC 81 MG tablet Take 1 tablet (81 mg total) by mouth daily. 90 tablet 2   atorvastatin (LIPITOR) 40 MG tablet Take 1 tablet (40 mg total) by mouth daily. 90 tablet 1   Cholecalciferol (VITAMIN D3) 250 MCG (10000 UT) capsule Take 10,000 mcg by mouth daily.     escitalopram (LEXAPRO) 10 MG tablet Take 1 tablet by mouth once daily 90 tablet 1   folic acid (FOLVITE) 1 MG tablet Take 1 tablet by  mouth once daily 30 tablet 0   levothyroxine (SYNTHROID) 137 MCG tablet TAKE 1 TABLET BY MOUTH ONCE DAILY BEFORE BREAKFAST 30 tablet 2   losartan (COZAAR) 100 MG tablet Take 1 tablet by mouth once daily 90 tablet 1   metoprolol succinate (TOPROL-XL) 50 MG 24 hr tablet TAKE 1 TABLET BY MOUTH ONCE DAILY WITH A MEAL OR  IMMEDIATELY  FOLLOWING 90 tablet 1   Multiple Vitamins-Minerals (MULTIVITAMIN WITH MINERALS) tablet Take 1 tablet by mouth daily.     nitroGLYCERIN (NITROSTAT) 0.4 MG SL tablet Place 1 tablet (0.4 mg total) under the tongue every 5 (five) minutes as needed. For chest pain (Patient not taking: Reported on 10/26/2021) 25 tablet 6   prochlorperazine (COMPAZINE) 10 MG tablet Take 1 tablet (10 mg total) by mouth every 6 (six) hours as needed for nausea or vomiting. (Patient not taking: Reported on 10/26/2021) 30 tablet 0   Vitamin A 2400 MCG (8000 UT) TABS Take 2,400 mg by mouth daily.     vitamin B-12 (CYANOCOBALAMIN) 1000 MCG tablet Take 1,000 mcg by mouth daily. (Patient not taking: Reported on 10/26/2021)     No current facility-administered medications for this visit.    SURGICAL HISTORY:  Past Surgical History:  Procedure Laterality Date   CARDIAC CATHETERIZATION  01/02/2007   IT REVEALS MILD INFERIOR WALL HYPOKINESIS. THE EJECTION FRACTION IS AROUND 50%   COLONOSCOPY     CORONARY ARTERY BYPASS GRAFT  11/06/2006   LIMA to LAD, SVG to DX, SVG to LCX & SVG to OM 1 & 2, and SVG to PD   CORONARY STENT PLACEMENT     Remote past stent to RCA   EYE SURGERY Right    cataracts removed   HEMORRHOID SURGERY  11/07/1987   UPPER GI ENDOSCOPY     VENTRICULOSTOMY  10/06/2011   Procedure: VENTRICULOSTOMY;  Surgeon: Winfield Cunas;  Location: [facility];  Service: Neurosurgery;  Laterality: Right;  Insertion of Ventriculostomy Catheter   VIDEO BRONCHOSCOPY WITH ENDOBRONCHIAL NAVIGATION Left 05/20/2021   Procedure: VIDEO BRONCHOSCOPY WITH ENDOBRONCHIAL NAVIGATION;  Surgeon: Garner Nash, DO;  Location: [name];  Service: Pulmonary;  Laterality: Left;   VIDEO BRONCHOSCOPY WITH ENDOBRONCHIAL ULTRASOUND Bilateral 05/20/2021   Procedure: VIDEO BRONCHOSCOPY WITH ENDOBRONCHIAL ULTRASOUND;  Surgeon: Garner Nash, DO;  Location: [facility];  Service: Pulmonary;  Laterality: Bilateral;   WISDOM TOOTH EXTRACTION      REVIEW OF SYSTEMS:  A comprehensive review of systems was negative except for: Constitutional: positive for fatigue Eyes: positive for increased lacrimation    PHYSICAL EXAMINATION: General appearance: alert, cooperative, fatigued, and no distress Head: Normocephalic, without obvious abnormality, atraumatic Neck: no adenopathy, no JVD, supple, symmetrical, trachea midline, and thyroid not enlarged, symmetric, no tenderness/mass/nodules Lymph nodes: Cervical, supraclavicular, and axillary  nodes normal. Resp: clear to auscultation bilaterally Back: symmetric, no curvature. ROM normal. No CVA tenderness. Cardio: regular rate and rhythm, S1, S2 normal, no murmur, click, rub or gallop GI: soft, non-tender; bowel sounds normal; no masses,  no organomegaly Extremities: extremities normal, atraumatic, no cyanosis or edema  ECOG PERFORMANCE STATUS: 1 - Symptomatic but completely ambulatory  Blood pressure (!) 135/57, pulse 76, temperature 98.1 F (36.7 C), temperature source Oral, resp. rate 18, weight 141 lb 5 oz (64.1 kg), SpO2 90 %.  LABORATORY DATA: Lab Results  Component Value Date   WBC 5.7 05/17/2022   HGB 11.9 (L) 05/17/2022   HCT 36.0 05/17/2022   MCV 103.7 (H) 05/17/2022   PLT 216 05/17/2022      Chemistry      Component Value Date/Time   NA 140 05/17/2022 0801   NA 141 04/12/2021 1356   K 3.8 05/17/2022 0801   CL 105 05/17/2022 0801   CO2 28 05/17/2022 0801   BUN 15 05/17/2022 0801   BUN 13 04/12/2021 1356   CREATININE 1.12 (H) 05/17/2022 0801   CREATININE 0.99 08/29/2016 1130      Component Value Date/Time   CALCIUM 9.2 05/17/2022 0801   ALKPHOS  68 05/17/2022 0801   AST 25 05/17/2022 0801   ALT 16 05/17/2022 0801   BILITOT 0.3 05/17/2022 0801       RADIOGRAPHIC STUDIES: No results found.  ASSESSMENT AND PLAN: This is a very pleasant 71 years old white female diagnosed with a stage IV (T2 a, N2, M1 B) non-small cell lung cancer, adenocarcinoma presented with left upper lobe lung mass in addition to left hilar and precarinal metastatic adenopathy as well as small left lower lobe pulmonary nodule and solitary left frontal brain metastasis diagnosed in July 2022.  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 The patient is status post SRS to the solitary brain metastasis. She underwent a course of concurrent chemoradiation to the locally advanced disease in the chest with carboplatin for AUC of 2 and paclitaxel 45 Mg/M2 status post 7 cycles.  The patient has been tolerating her treatment well with no concerning adverse effects. Her scan showed mild improvement of her disease with no concerning findings for progression. Technically the patient has a stage IV lung cancer with brain metastasis  The patient is currently undergoing systemic chemotherapy with carboplatin for AUC of 5, Alimta 500 Mg/M2 and Keytruda 200 Mg IV every 3 weeks status post 12 cycles.  Starting from cycle #5 the patient will be on maintenance treatment with Alimta and Keytruda every 3 weeks.   The patient has been tolerating her treatment fairly well with no concerning adverse effect except for mild fatigue and increased lacrimation and runny nose likely from the Alimta. I recommended for her to proceed with cycle #13 today as planned. I will see her back for follow-up visit in 3 weeks for evaluation and repeat CT scan of the chest, abdomen and pelvis for restaging of her disease. The patient was advised to call immediately if she has any other concerning symptoms in the interval. The patient voices understanding of current  disease status and treatment options and is in agreement with the current care plan. The total time spent in the appointment was 20 minutes.  All questions were answered. The patient knows to call the clinic with any problems, questions or concerns. We can certainly see the patient much sooner if necessary.  Disclaimer: This note was dictated  with voice recognition software. Similar sounding words can inadvertently be transcribed and may not be corrected upon review.

## 2022-06-08 ENCOUNTER — Other Ambulatory Visit: Payer: Self-pay

## 2022-06-14 ENCOUNTER — Other Ambulatory Visit: Payer: Self-pay | Admitting: Internal Medicine

## 2022-06-22 ENCOUNTER — Other Ambulatory Visit: Payer: Medicare HMO

## 2022-06-22 ENCOUNTER — Ambulatory Visit: Payer: Medicare HMO | Admitting: Physician Assistant

## 2022-06-22 ENCOUNTER — Ambulatory Visit: Payer: Medicare HMO

## 2022-06-25 ENCOUNTER — Ambulatory Visit (HOSPITAL_COMMUNITY)
Admission: RE | Admit: 2022-06-25 | Discharge: 2022-06-25 | Disposition: A | Discharge status: Home / Self Care | Payer: Medicare HMO | Source: Ambulatory Visit | Attending: Internal Medicine | Admitting: Internal Medicine

## 2022-06-25 DIAGNOSIS — C3490 Malignant neoplasm of unspecified part of unspecified bronchus or lung: Secondary | ICD-10-CM | POA: Insufficient documentation

## 2022-06-25 DIAGNOSIS — N281 Cyst of kidney, acquired: Secondary | ICD-10-CM | POA: Diagnosis not present

## 2022-06-25 DIAGNOSIS — K760 Fatty (change of) liver, not elsewhere classified: Secondary | ICD-10-CM | POA: Diagnosis not present

## 2022-06-25 DIAGNOSIS — J439 Emphysema, unspecified: Secondary | ICD-10-CM | POA: Diagnosis not present

## 2022-06-25 MED ORDER — IOHEXOL 300 MG/ML  SOLN
100.0000 mL | Freq: Once | INTRAMUSCULAR | Status: AC | PRN
  Administered 2022-06-25: 100 mL via INTRAVENOUS

## 2022-06-26 ENCOUNTER — Other Ambulatory Visit: Payer: Self-pay

## 2022-06-28 ENCOUNTER — Inpatient Hospital Stay: Payer: Medicare HMO

## 2022-06-28 ENCOUNTER — Inpatient Hospital Stay (HOSPITAL_BASED_OUTPATIENT_CLINIC_OR_DEPARTMENT_OTHER): Payer: Medicare HMO | Admitting: Internal Medicine

## 2022-06-28 ENCOUNTER — Encounter: Payer: Self-pay | Admitting: Internal Medicine

## 2022-06-28 ENCOUNTER — Other Ambulatory Visit: Payer: Self-pay

## 2022-06-28 VITALS — BP 134/63 | HR 89 | Temp 97.9°F | Resp 17 | Wt 140.1 lb

## 2022-06-28 VITALS — BP 126/65 | HR 67 | Temp 98.3°F | Resp 18

## 2022-06-28 DIAGNOSIS — J432 Centrilobular emphysema: Secondary | ICD-10-CM | POA: Diagnosis not present

## 2022-06-28 DIAGNOSIS — C3412 Malignant neoplasm of upper lobe, left bronchus or lung: Secondary | ICD-10-CM

## 2022-06-28 DIAGNOSIS — Z8719 Personal history of other diseases of the digestive system: Secondary | ICD-10-CM | POA: Diagnosis not present

## 2022-06-28 DIAGNOSIS — I252 Old myocardial infarction: Secondary | ICD-10-CM | POA: Diagnosis not present

## 2022-06-28 DIAGNOSIS — Z888 Allergy status to other drugs, medicaments and biological substances status: Secondary | ICD-10-CM | POA: Diagnosis not present

## 2022-06-28 DIAGNOSIS — Z923 Personal history of irradiation: Secondary | ICD-10-CM | POA: Diagnosis not present

## 2022-06-28 DIAGNOSIS — I700 Atherosclerosis of aorta: Secondary | ICD-10-CM | POA: Diagnosis not present

## 2022-06-28 DIAGNOSIS — Z882 Allergy status to sulfonamides status: Secondary | ICD-10-CM | POA: Diagnosis not present

## 2022-06-28 DIAGNOSIS — Z79890 Hormone replacement therapy (postmenopausal): Secondary | ICD-10-CM | POA: Diagnosis not present

## 2022-06-28 DIAGNOSIS — K760 Fatty (change of) liver, not elsewhere classified: Secondary | ICD-10-CM | POA: Diagnosis not present

## 2022-06-28 DIAGNOSIS — Z87440 Personal history of urinary (tract) infections: Secondary | ICD-10-CM | POA: Diagnosis not present

## 2022-06-28 DIAGNOSIS — K314 Gastric diverticulum: Secondary | ICD-10-CM | POA: Diagnosis not present

## 2022-06-28 DIAGNOSIS — I119 Hypertensive heart disease without heart failure: Secondary | ICD-10-CM | POA: Diagnosis not present

## 2022-06-28 DIAGNOSIS — Z5112 Encounter for antineoplastic immunotherapy: Secondary | ICD-10-CM | POA: Diagnosis not present

## 2022-06-28 DIAGNOSIS — J449 Chronic obstructive pulmonary disease, unspecified: Secondary | ICD-10-CM | POA: Diagnosis not present

## 2022-06-28 DIAGNOSIS — R0989 Other specified symptoms and signs involving the circulatory and respiratory systems: Secondary | ICD-10-CM | POA: Diagnosis not present

## 2022-06-28 DIAGNOSIS — Z5111 Encounter for antineoplastic chemotherapy: Secondary | ICD-10-CM | POA: Diagnosis not present

## 2022-06-28 DIAGNOSIS — R5383 Other fatigue: Secondary | ICD-10-CM | POA: Diagnosis not present

## 2022-06-28 DIAGNOSIS — Z880 Allergy status to penicillin: Secondary | ICD-10-CM | POA: Diagnosis not present

## 2022-06-28 DIAGNOSIS — K449 Diaphragmatic hernia without obstruction or gangrene: Secondary | ICD-10-CM | POA: Diagnosis not present

## 2022-06-28 DIAGNOSIS — E785 Hyperlipidemia, unspecified: Secondary | ICD-10-CM | POA: Diagnosis not present

## 2022-06-28 DIAGNOSIS — Z79899 Other long term (current) drug therapy: Secondary | ICD-10-CM | POA: Diagnosis not present

## 2022-06-28 DIAGNOSIS — C7931 Secondary malignant neoplasm of brain: Secondary | ICD-10-CM | POA: Diagnosis not present

## 2022-06-28 DIAGNOSIS — E039 Hypothyroidism, unspecified: Secondary | ICD-10-CM | POA: Diagnosis not present

## 2022-06-28 LAB — CBC WITH DIFFERENTIAL (CANCER CENTER ONLY)
Abs Immature Granulocytes: 0.01 10*3/uL (ref 0.00–0.07)
Basophils Absolute: 0.1 10*3/uL (ref 0.0–0.1)
Basophils Relative: 2 %
Eosinophils Absolute: 0.1 10*3/uL (ref 0.0–0.5)
Eosinophils Relative: 2 %
HCT: 35.2 % — ABNORMAL LOW (ref 36.0–46.0)
Hemoglobin: 11.7 g/dL — ABNORMAL LOW (ref 12.0–15.0)
Immature Granulocytes: 0 %
Lymphocytes Relative: 21 %
Lymphs Abs: 1.1 10*3/uL (ref 0.7–4.0)
MCH: 34.7 pg — ABNORMAL HIGH (ref 26.0–34.0)
MCHC: 33.2 g/dL (ref 30.0–36.0)
MCV: 104.5 fL — ABNORMAL HIGH (ref 80.0–100.0)
Monocytes Absolute: 0.4 10*3/uL (ref 0.1–1.0)
Monocytes Relative: 9 %
Neutro Abs: 3.3 10*3/uL (ref 1.7–7.7)
Neutrophils Relative %: 66 %
Platelet Count: 323 10*3/uL (ref 150–400)
RBC: 3.37 MIL/uL — ABNORMAL LOW (ref 3.87–5.11)
RDW: 14.4 % (ref 11.5–15.5)
WBC Count: 5 10*3/uL (ref 4.0–10.5)
nRBC: 0 % (ref 0.0–0.2)

## 2022-06-28 LAB — CMP (CANCER CENTER ONLY)
ALT: 18 U/L (ref 0–44)
AST: 28 U/L (ref 15–41)
Albumin: 4.1 g/dL (ref 3.5–5.0)
Alkaline Phosphatase: 78 U/L (ref 38–126)
Anion gap: 6 (ref 5–15)
BUN: 8 mg/dL (ref 8–23)
CO2: 29 mmol/L (ref 22–32)
Calcium: 9.5 mg/dL (ref 8.9–10.3)
Chloride: 105 mmol/L (ref 98–111)
Creatinine: 0.93 mg/dL (ref 0.44–1.00)
GFR, Estimated: >60 mL/min (ref >60)
Glucose, Bld: 107 mg/dL — ABNORMAL HIGH (ref 70–99)
Potassium: 3.7 mmol/L (ref 3.5–5.1)
Sodium: 140 mmol/L (ref 135–145)
Total Bilirubin: 0.4 mg/dL (ref 0.3–1.2)
Total Protein: 7.2 g/dL (ref 6.5–8.1)

## 2022-06-28 MED ORDER — SODIUM CHLORIDE 0.9 % IV SOLN
500.0000 mg/m2 | Freq: Once | INTRAVENOUS | Status: AC
  Administered 2022-06-28: 900 mg via INTRAVENOUS
  Filled 2022-06-28: qty 20

## 2022-06-28 MED ORDER — PROCHLORPERAZINE MALEATE 10 MG PO TABS
10.0000 mg | ORAL_TABLET | Freq: Once | ORAL | Status: AC
  Administered 2022-06-28: 10 mg via ORAL
  Filled 2022-06-28: qty 1

## 2022-06-28 MED ORDER — SODIUM CHLORIDE 0.9 % IV SOLN: Freq: Once | INTRAVENOUS | Status: AC

## 2022-06-28 MED ORDER — SODIUM CHLORIDE 0.9 % IV SOLN
200.0000 mg | Freq: Once | INTRAVENOUS | Status: AC
  Administered 2022-06-28: 200 mg via INTRAVENOUS
  Filled 2022-06-28: qty 200

## 2022-06-28 NOTE — Patient Instructions (Signed)
Steps to Quit Smoking Smoking tobacco is the leading cause of preventable death. It can affect almost every organ in the body. Smoking puts you and people around you at risk for many serious, long-lasting (chronic) diseases. Quitting smoking can be hard, but it is one of the best things that you can do for your health. It is never too late to quit. Do not give up if you cannot quit the first time. Some people need to try many times to quit. Do your best to stick to your quit plan, and talk with your doctor if you have any questions or concerns. How do I get ready to quit? Pick a date to quit. Set a date within the next 2 weeks to give you time to prepare. Write down the reasons why you are quitting. Keep this list in places where you will see it often. Tell your family, friends, and co-workers that you are quitting. Their support is important. Talk with your doctor about the choices that may help you quit. Find out if your health insurance will pay for these treatments. Know the people, places, things, and activities that make you want to smoke (triggers). Avoid them. What first steps can I take to quit smoking? Throw away all cigarettes at home, at work, and in your car. Throw away the things that you use when you smoke, such as ashtrays and lighters. Clean your car. Empty the ashtray. Clean your home, including curtains and carpets. What can I do to help me quit smoking? Talk with your doctor about taking medicines and seeing a counselor. You are more likely to succeed when you do both. If you are pregnant or breastfeeding: Talk with your doctor about counseling or other ways to quit smoking. Do not take medicine to help you quit smoking unless your doctor tells you to. Quit right away Quit smoking completely, instead of slowly cutting back on how much you smoke over a period of time. Stopping smoking right away may be more successful than slowly quitting. Go to counseling. In-person is best  if this is an option. You are more likely to quit if you go to counseling sessions regularly. Take medicine You may take medicines to help you quit. Some medicines need a prescription, and some you can buy over-the-counter. Some medicines may contain a drug called nicotine to replace the nicotine in cigarettes. Medicines may: Help you stop having the desire to smoke (cravings). Help to stop the problems that come when you stop smoking (withdrawal symptoms). Your doctor may ask you to use: Nicotine patches, gum, or lozenges. Nicotine inhalers or sprays. Non-nicotine medicine that you take by mouth. Find resources Find resources and other ways to help you quit smoking and remain smoke-free after you quit. They include: Online chats with a counselor. Phone quitlines. Printed self-help materials. Support groups or group counseling. Text messaging programs. Mobile phone apps. Use apps on your mobile phone or tablet that can help you stick to your quit plan. Examples of free services include Quit Guide from the CDC and smokefree.gov  What can I do to make it easier to quit?  Talk to your family and friends. Ask them to support and encourage you. Call a phone quitline, such as 1-800-QUIT-NOW, reach out to support groups, or work with a counselor. Ask people who smoke to not smoke around you. Avoid places that make you want to smoke, such as: Bars. Parties. Smoke-break areas at work. Spend time with people who do not smoke. Lower   the stress in your life. Stress can make you want to smoke. Try these things to lower stress: Getting regular exercise. Doing deep-breathing exercises. Doing yoga. Meditating. What benefits will I see if I quit smoking? Over time, you may have: A better sense of smell and taste. Less coughing and sore throat. A slower heart rate. Lower blood pressure. Clearer skin. Better breathing. Fewer sick days. Summary Quitting smoking can be hard, but it is one of  the best things that you can do for your health. Do not give up if you cannot quit the first time. Some people need to try many times to quit. When you decide to quit smoking, make a plan to help you succeed. Quit smoking right away, not slowly over a period of time. When you start quitting, get help and support to keep you smoke-free. This information is not intended to replace advice given to you by your health care provider. Make sure you discuss any questions you have with your health care provider. Document Revised: 10/14/2021 Document Reviewed: 10/14/2021 Elsevier Patient Education  2023 [employer]  

## 2022-06-28 NOTE — Patient Instructions (Signed)
[facility]COL[facility]  Discharge Instructions: Thank you for choosing [facility] to provide your oncology and hematology care.   If you have a lab appointment with the [facility], please go directly to the [facility] and check in at the registration area.   Wear comfortable clothing and clothing appropriate for easy access to any Portacath or PICC line.   We strive to give you quality time with your provider. You may need to reschedule your appointment if you arrive late (15 or more minutes).  Arriving late affects you and other patients whose appointments are after yours.  Also, if you miss three or more appointments without notifying the office, you may be dismissed from the clinic at the provider's discretion.      For prescription refill requests, have your pharmacy contact our office and allow 72 hours for refills to be completed.    Today you received the following chemotherapy and/or immunotherapy agents: Keytruda and Alimta      To help prevent nausea and vomiting after your treatment, we encourage you to take your nausea medication as directed.  BELOW ARE SYMPTOMS THAT SHOULD BE REPORTED IMMEDIATELY: *FEVER GREATER THAN 100.4 F (38 C) OR HIGHER *CHILLS OR SWEATING *NAUSEA AND VOMITING THAT IS NOT CONTROLLED WITH YOUR NAUSEA MEDICATION *UNUSUAL SHORTNESS OF BREATH *UNUSUAL BRUISING OR BLEEDING *URINARY PROBLEMS (pain or burning when urinating, or frequent urination) *BOWEL PROBLEMS (unusual diarrhea, constipation, pain near the anus) TENDERNESS IN MOUTH AND THROAT WITH OR WITHOUT PRESENCE OF ULCERS (sore throat, sores in mouth, or a toothache) UNUSUAL RASH, SWELLING OR PAIN  UNUSUAL VAGINAL DISCHARGE OR ITCHING   Items with * indicate a potential emergency and should be followed up as soon as possible or go to the Emergency Department if any problems should occur.  Please show the CHEMOTHERAPY ALERT CARD or IMMUNOTHERAPY ALERT CARD at  check-in to the Emergency Department and triage nurse.  Should you have questions after your visit or need to cancel or reschedule your appointment, please contact [facility]  Dept: [phone]  and follow the prompts.  Office hours are 8:00 a.m. to 4:30 p.m. Monday - Friday. Please note that voicemails left after 4:00 p.m. may not be returned until the following business day.  We are closed weekends and major holidays. You have access to a nurse at all times for urgent questions. Please call the main number to the clinic Dept: [phone] and follow the prompts.   For any non-urgent questions, you may also contact your provider using MyChart. We now offer e-Visits for anyone 22 and older to request care online for non-urgent symptoms. For details visit [email or web address].   Also download the MyChart app! Go to the app store, search "MyChart", open the app, select [facility], and log in with your MyChart username and password.  Masks are optional in the cancer centers. If you would like for your care team to wear a mask while they are taking care of you, please let them know. For doctor visits, patients may have with them one support person who is at least 73 years old. At this time, visitors are not allowed in the infusion area.

## 2022-06-28 NOTE — Progress Notes (Signed)
[place] [phone]   [phone]  OFFICE PROGRESS NOTE  [name], MD [place] [address]  DIAGNOSIS: Stage IV (T2 a, N2, M1b) non-small cell lung cancer, adenocarcinoma presented with left upper lobe lung mass in addition to left hilar and precarinal metastatic adenopathy and small left lower lobe pulmonary nodule in addition to solitary left frontal brain metastasis diagnosed in July 2022.  Molecular studies by [facility] [employer] showed  Positive KRAS G12C mutation PD-L1 expression was less than 1%   PRIOR THERAPY:  1) Status post SRS to the solitary brain metastasis. 20 Treatment for the locally advanced disease in the chest with carboplatin for AUC of 2 and paclitaxel 45 Mg/M2.  Status post 6 cycles.  Last dose was given 07/25/2021.  CURRENT THERAPY: Systemic chemotherapy with carboplatin for AUC of 5, Alimta 500 Mg/M2 and Keytruda 200 Mg IV every 3 weeks.  First dose August 31, 2021.  Status post 13 cycles.  Starting from cycle #5 the patient will be on maintenance treatment with Alimta and Keytruda every 3 weeks.  INTERVAL HISTORY: Jill [name] 71 y.o. female returns to the clinic today for follow-up visit.  The patient is feeling fine today with no concerning complaints.  She denied having any current chest pain, shortness of breath, cough or hemoptysis.  She has no nausea, vomiting, diarrhea or constipation.  She denied having any headache or visual changes.  She has no recent weight loss or night sweats.  She has no fever or chills.  She has been tolerating her treatment with maintenance Alimta and Keytruda fairly well.  The patient has repeat CT scan of the chest, abdomen and pelvis performed recently and she is here for evaluation and discussion of her scan results before starting cycle #14.  MEDICAL HISTORY: Past Medical History:  Diagnosis Date   Anxiety    ON PAXIL, XANAX   AVM (arteriovenous  malformation) brain    s/p stent/coil   Blood transfusion    x 2   Cancer (HCC)    Left lung   COPD (chronic obstructive pulmonary disease) (HCC)    no inhaler, no oxygen   Coronary artery disease    Prior inferior MI with stent to RCA, s/p CABG in 2008   Depression    Dizziness    Dyspnea    with exertion, no oxygen   Fatigue    Foot injury 06/01/2017   right - RESOLVED, no longer an issue per patient 05/18/21   GERD (gastroesophageal reflux disease)    Headache(784.0)    UNRUPTURED CEREBRAL ANEURYSM   Hyperlipidemia    Hypertension    Hypothyroidism    Infected cyst of skin 09/18/2013   Left knee pain 06/05/2012   Lung mass    Left lung   Myocardial infarction ([facility])    Normal nuclear stress test Ju;y 2012   No ischemia. EF 70%; fixed defect involving septum, inferoseptal and inferior wall   Pain, dental 02/06/2017   Poor dental hygiene    Retroperitoneal bleeding    Following cardiac cath   Tobacco abuse    Urine discoloration 09/18/2013   Urine incontinence 07/06/2020   UTI (lower urinary tract infection) 10/16/2013    ALLERGIES:  is allergic to varenicline, ace inhibitors, prednisone, betalin 12 [vitamin b12], penicillins, and sulfa drugs cross reactors.  MEDICATIONS:  Current Outpatient Medications  Medication Sig Dispense Refill   acetaminophen (TYLENOL) 500 MG tablet Take 500  mg by mouth every 6 (six) hours as needed.     amLODipine (NORVASC) 10 MG tablet Take 1 tablet by mouth once daily 90 tablet 1   amoxicillin-clavulanate (AUGMENTIN) 875-125 MG tablet Take 1 tablet by mouth 2 (two) times daily. (Patient not taking: Reported on 10/26/2021) 12 tablet 0   aspirin EC 81 MG tablet Take 1 tablet (81 mg total) by mouth daily. 90 tablet 2   atorvastatin (LIPITOR) 40 MG tablet Take 1 tablet (40 mg total) by mouth daily. 90 tablet 1   Cholecalciferol (VITAMIN D3) 250 MCG (10000 UT) capsule Take 10,000 mcg by mouth daily.     escitalopram (LEXAPRO) 10 MG tablet Take 1  tablet by mouth once daily 90 tablet 1   folic acid (FOLVITE) 1 MG tablet Take 1 tablet by mouth once daily 30 tablet 0   levothyroxine (SYNTHROID) 137 MCG tablet TAKE 1 TABLET BY MOUTH ONCE DAILY BEFORE BREAKFAST 30 tablet 2   losartan (COZAAR) 100 MG tablet Take 1 tablet by mouth once daily 90 tablet 1   metoprolol succinate (TOPROL-XL) 50 MG 24 hr tablet TAKE 1 TABLET BY MOUTH ONCE DAILY WITH A MEAL OR  IMMEDIATELY  FOLLOWING 90 tablet 1   Multiple Vitamins-Minerals (MULTIVITAMIN WITH MINERALS) tablet Take 1 tablet by mouth daily.     nitroGLYCERIN (NITROSTAT) 0.4 MG SL tablet Place 1 tablet (0.4 mg total) under the tongue every 5 (five) minutes as needed. For chest pain (Patient not taking: Reported on 10/26/2021) 25 tablet 6   prochlorperazine (COMPAZINE) 10 MG tablet Take 1 tablet (10 mg total) by mouth every 6 (six) hours as needed for nausea or vomiting. (Patient not taking: Reported on 10/26/2021) 30 tablet 0   Vitamin A 2400 MCG (8000 UT) TABS Take 2,400 mg by mouth daily.     vitamin B-12 (CYANOCOBALAMIN) 1000 MCG tablet Take 1,000 mcg by mouth daily. (Patient not taking: Reported on 10/26/2021)     No current facility-administered medications for this visit.    SURGICAL HISTORY:  Past Surgical History:  Procedure Laterality Date   CARDIAC CATHETERIZATION  01/02/2007   IT REVEALS MILD INFERIOR WALL HYPOKINESIS. THE EJECTION FRACTION IS AROUND 50%   COLONOSCOPY     CORONARY ARTERY BYPASS GRAFT  11/06/2006   LIMA to LAD, SVG to DX, SVG to LCX & SVG to OM 1 & 2, and SVG to PD   CORONARY STENT PLACEMENT     Remote past stent to RCA   EYE SURGERY Right    cataracts removed   HEMORRHOID SURGERY  11/07/1987   UPPER GI ENDOSCOPY     VENTRICULOSTOMY  10/06/2011   Procedure: VENTRICULOSTOMY;  Surgeon: Winfield Cunas;  Location: [facility];  Service: Neurosurgery;  Laterality: Right;  Insertion of Ventriculostomy Catheter   VIDEO BRONCHOSCOPY WITH ENDOBRONCHIAL NAVIGATION Left  05/20/2021   Procedure: VIDEO BRONCHOSCOPY WITH ENDOBRONCHIAL NAVIGATION;  Surgeon: Garner Nash, DO;  Location: [facility];  Service: Pulmonary;  Laterality: Left;   VIDEO BRONCHOSCOPY WITH ENDOBRONCHIAL ULTRASOUND Bilateral 05/20/2021   Procedure: VIDEO BRONCHOSCOPY WITH ENDOBRONCHIAL ULTRASOUND;  Surgeon: Garner Nash, DO;  Location: [facility];  Service: Pulmonary;  Laterality: Bilateral;   WISDOM TOOTH EXTRACTION      REVIEW OF SYSTEMS:  Constitutional: negative Eyes: negative Ears, nose, mouth, throat, and face: negative Respiratory: negative Cardiovascular: negative Gastrointestinal: negative Genitourinary:negative Integument/breast: negative Hematologic/lymphatic: negative Musculoskeletal:negative Neurological: negative Behavioral/Psych: negative Endocrine: negative Allergic/Immunologic: negative   PHYSICAL EXAMINATION: General appearance: alert, cooperative, fatigued, and no  distress Head: Normocephalic, without obvious abnormality, atraumatic Neck: no adenopathy, no JVD, supple, symmetrical, trachea midline, and thyroid not enlarged, symmetric, no tenderness/mass/nodules Lymph nodes: Cervical, supraclavicular, and axillary nodes normal. Resp: clear to auscultation bilaterally Back: symmetric, no curvature. ROM normal. No CVA tenderness. Cardio: regular rate and rhythm, S1, S2 normal, no murmur, click, rub or gallop GI: soft, non-tender; bowel sounds normal; no masses,  no organomegaly Extremities: extremities normal, atraumatic, no cyanosis or edema Neurologic: Alert and oriented X 3, normal strength and tone. Normal symmetric reflexes. Normal coordination and gait  ECOG PERFORMANCE STATUS: 1 - Symptomatic but completely ambulatory  Blood pressure 134/63, pulse 89, temperature 97.9 F (36.6 C), temperature source Oral, resp. rate 17, weight 140 lb 1 oz (63.5 kg), SpO2 91 %.  LABORATORY DATA: Lab Results  Component Value Date   WBC 5.0 06/28/2022   HGB 11.7 (L)  06/28/2022   HCT 35.2 (L) 06/28/2022   MCV 104.5 (H) 06/28/2022   PLT 323 06/28/2022      Chemistry      Component Value Date/Time   NA 141 06/07/2022 0917   NA 141 04/12/2021 1356   K 3.7 06/07/2022 0917   CL 105 06/07/2022 0917   CO2 29 06/07/2022 0917   BUN 14 06/07/2022 0917   BUN 13 04/12/2021 1356   CREATININE 1.00 06/07/2022 0917   CREATININE 0.99 08/29/2016 1130      Component Value Date/Time   CALCIUM 9.4 06/07/2022 0917   ALKPHOS 76 06/07/2022 0917   AST 24 06/07/2022 0917   ALT 16 06/07/2022 0917   BILITOT 0.3 06/07/2022 0917       RADIOGRAPHIC STUDIES: CT Chest W Contrast  Result Date: 06/27/2022 CLINICAL DATA:  Non-small cell lung cancer, staging, post chemotherapy and radiation therapy. Shortness of breath. * Tracking Code: BO * EXAM: CT CHEST, ABDOMEN, AND PELVIS WITH CONTRAST TECHNIQUE: Multidetector CT imaging of the chest, abdomen and pelvis was performed following the standard protocol during bolus administration of intravenous contrast. RADIATION DOSE REDUCTION: This exam was performed according to the departmental dose-optimization program which includes automated exposure control, adjustment of the mA and/or kV according to patient size and/or use of iterative reconstruction technique. CONTRAST:  170m OMNIPAQUE IOHEXOL 300 MG/ML  SOLN COMPARISON:  04/17/2022. FINDINGS: CT CHEST FINDINGS Cardiovascular: Atherosclerotic calcification of the aorta. Heart is at the upper limits of normal in size. Left ventricle is somewhat dilated. No pericardial effusion. Mediastinum/Nodes: No pathologically enlarged mediastinal, hilar or axillary lymph nodes. Esophagus is grossly unremarkable. Lungs/Pleura: Biapical pleuroparenchymal scarring. Centrilobular emphysema. Spiculated consolidation in the posterior left upper lobe measures 2.1 x 2.6 cm (7/63), stable. Surrounding architectural distortion. Otherwise, no suspicious pulmonary nodules. No pleural fluid. Minimal debris in  the airway. Musculoskeletal: Degenerative changes in the spine. No worrisome lytic or sclerotic lesions. CT ABDOMEN PELVIS FINDINGS Hepatobiliary: Liver is slightly decreased in attenuation diffusely. Subcentimeter low-attenuation lesions, too small to characterize. No specific follow-up necessary. Gallbladder unremarkable. Low-attenuation lesion in the porta hepatis measures 2.3 x 3.2 cm, stable and possibly a choledochal cyst. No biliary ductal dilatation. Pancreas: Negative. Spleen: Negative. Adrenals/Urinary Tract: Adrenal glands are unremarkable. Low-attenuation lesions in the kidneys measure up to 2.6 cm on the left and are likely cysts. No specific follow-up necessary. 1.5 cm mildly hyperdense lesion off the upper pole left kidney measures 15 Hounsfield units. On PET, lesion measures 32 Hounsfield units. A hyperdense cyst is likely. No specific follow-up necessary. Ureters are decompressed. There may be slight anterolateral bladder wall thickening,  similar. Stomach/Bowel: Tiny hiatal hernia. Posterior gastric diverticulum. Stomach, small bowel, appendix and colon are unremarkable. Vascular/Lymphatic: Atherosclerotic calcification of the aorta. No pathologically enlarged lymph nodes. Reproductive: Uterus is visualized.  No adnexal mass. Other: No free fluid.  Mesenteries and peritoneum are unremarkable. Musculoskeletal: Degenerative changes in the spine and hips. Difficult to definitively exclude mild changes of avascular necrosis in the femoral heads bilaterally. IMPRESSION: 1. Spiculated consolidation in the left upper lobe, stable and compatible with treated bronchogenic carcinoma. No evidence of metastatic disease. 2. Hepatic steatosis. 3. Possible choledochal cyst, stable. 4.  Aortic atherosclerosis (ICD10-I70.0). 5.  Emphysema (ICD10-J43.9). Electronically Signed   By: Lorin Picket M.D.   On: 06/27/2022 08:23   CT Abdomen Pelvis W Contrast  Result Date: 06/27/2022 CLINICAL DATA:  Non-small cell  lung cancer, staging, post chemotherapy and radiation therapy. Shortness of breath. * Tracking Code: BO * EXAM: CT CHEST, ABDOMEN, AND PELVIS WITH CONTRAST TECHNIQUE: Multidetector CT imaging of the chest, abdomen and pelvis was performed following the standard protocol during bolus administration of intravenous contrast. RADIATION DOSE REDUCTION: This exam was performed according to the departmental dose-optimization program which includes automated exposure control, adjustment of the mA and/or kV according to patient size and/or use of iterative reconstruction technique. CONTRAST:  173m OMNIPAQUE IOHEXOL 300 MG/ML  SOLN COMPARISON:  04/17/2022. FINDINGS: CT CHEST FINDINGS Cardiovascular: Atherosclerotic calcification of the aorta. Heart is at the upper limits of normal in size. Left ventricle is somewhat dilated. No pericardial effusion. Mediastinum/Nodes: No pathologically enlarged mediastinal, hilar or axillary lymph nodes. Esophagus is grossly unremarkable. Lungs/Pleura: Biapical pleuroparenchymal scarring. Centrilobular emphysema. Spiculated consolidation in the posterior left upper lobe measures 2.1 x 2.6 cm (7/63), stable. Surrounding architectural distortion. Otherwise, no suspicious pulmonary nodules. No pleural fluid. Minimal debris in the airway. Musculoskeletal: Degenerative changes in the spine. No worrisome lytic or sclerotic lesions. CT ABDOMEN PELVIS FINDINGS Hepatobiliary: Liver is slightly decreased in attenuation diffusely. Subcentimeter low-attenuation lesions, too small to characterize. No specific follow-up necessary. Gallbladder unremarkable. Low-attenuation lesion in the porta hepatis measures 2.3 x 3.2 cm, stable and possibly a choledochal cyst. No biliary ductal dilatation. Pancreas: Negative. Spleen: Negative. Adrenals/Urinary Tract: Adrenal glands are unremarkable. Low-attenuation lesions in the kidneys measure up to 2.6 cm on the left and are likely cysts. No specific follow-up  necessary. 1.5 cm mildly hyperdense lesion off the upper pole left kidney measures 15 Hounsfield units. On PET, lesion measures 32 Hounsfield units. A hyperdense cyst is likely. No specific follow-up necessary. Ureters are decompressed. There may be slight anterolateral bladder wall thickening, similar. Stomach/Bowel: Tiny hiatal hernia. Posterior gastric diverticulum. Stomach, small bowel, appendix and colon are unremarkable. Vascular/Lymphatic: Atherosclerotic calcification of the aorta. No pathologically enlarged lymph nodes. Reproductive: Uterus is visualized.  No adnexal mass. Other: No free fluid.  Mesenteries and peritoneum are unremarkable. Musculoskeletal: Degenerative changes in the spine and hips. Difficult to definitively exclude mild changes of avascular necrosis in the femoral heads bilaterally. IMPRESSION: 1. Spiculated consolidation in the left upper lobe, stable and compatible with treated bronchogenic carcinoma. No evidence of metastatic disease. 2. Hepatic steatosis. 3. Possible choledochal cyst, stable. 4.  Aortic atherosclerosis (ICD10-I70.0). 5.  Emphysema (ICD10-J43.9). Electronically Signed   By: MLorin PicketM.D.   On: 06/27/2022 08:23    ASSESSMENT AND PLAN: This is a very pleasant 71years old white female diagnosed with a stage IV (T2 a, N2, M1 B) non-small cell lung cancer, adenocarcinoma presented with left upper lobe lung mass in addition to left  hilar and precarinal metastatic adenopathy as well as small left lower lobe pulmonary nodule and solitary left frontal brain metastasis diagnosed in July 2022.  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 The patient is status post SRS to the solitary brain metastasis. She underwent a course of concurrent chemoradiation to the locally advanced disease in the chest with carboplatin for AUC of 2 and paclitaxel 45 Mg/M2 status post 7 cycles.  The patient has been tolerating her treatment well  with no concerning adverse effects. Her scan showed mild improvement of her disease with no concerning findings for progression. Technically the patient has a stage IV lung cancer with brain metastasis  The patient is currently undergoing systemic chemotherapy with carboplatin for AUC of 5, Alimta 500 Mg/M2 and Keytruda 200 Mg IV every 3 weeks status post 13 cycles.  Starting from cycle #5 the patient will be on maintenance treatment with Alimta and Keytruda every 3 weeks.   The patient has been tolerating this treatment well with no concerning adverse effects. She had repeat CT scan of the chest, abdomen and pelvis performed recently.  I personally and independently reviewed the scan and discussed the result with the patient today. Her scan showed no concerning findings for disease progression. I recommended for her to continue on maintenance treatment with Alimta and Keytruda and she will proceed with cycle #14 today. The patient will come back for follow-up visit in 3 weeks for evaluation before starting the next cycle of her treatment. She was advised to call immediately if she has any other concerning symptoms in the interval. The patient voices understanding of current disease status and treatment options and is in agreement with the current care plan. The total time spent in the appointment was 30 minutes.  All questions were answered. The patient knows to call the clinic with any problems, questions or concerns. We can certainly see the patient much sooner if necessary.  Disclaimer: This note was dictated with voice recognition software. Similar sounding words can inadvertently be transcribed and may not be corrected upon review.

## 2022-06-30 ENCOUNTER — Other Ambulatory Visit: Payer: Self-pay | Admitting: Radiation Therapy

## 2022-06-30 DIAGNOSIS — C7931 Secondary malignant neoplasm of brain: Secondary | ICD-10-CM

## 2022-07-07 ENCOUNTER — Telehealth: Payer: Self-pay | Admitting: Internal Medicine

## 2022-07-07 NOTE — Telephone Encounter (Signed)
Called patient regarding upcoming appointments, patient is notified. 

## 2022-07-08 ENCOUNTER — Other Ambulatory Visit: Payer: Self-pay

## 2022-07-12 ENCOUNTER — Ambulatory Visit
Admission: RE | Admit: 2022-07-12 | Discharge: 2022-07-12 | Disposition: A | Discharge status: Home / Self Care | Payer: Medicare HMO | Source: Ambulatory Visit | Attending: Radiation Oncology | Admitting: Radiation Oncology

## 2022-07-12 DIAGNOSIS — C3490 Malignant neoplasm of unspecified part of unspecified bronchus or lung: Secondary | ICD-10-CM | POA: Diagnosis not present

## 2022-07-12 DIAGNOSIS — I725 Aneurysm of other precerebral arteries: Secondary | ICD-10-CM | POA: Diagnosis not present

## 2022-07-12 DIAGNOSIS — I611 Nontraumatic intracerebral hemorrhage in hemisphere, cortical: Secondary | ICD-10-CM | POA: Diagnosis not present

## 2022-07-12 DIAGNOSIS — C7931 Secondary malignant neoplasm of brain: Secondary | ICD-10-CM

## 2022-07-12 MED ORDER — GADOBENATE DIMEGLUMINE 529 MG/ML IV SOLN
13.0000 mL | Freq: Once | INTRAVENOUS | Status: AC | PRN
Start: 2022-07-12 — End: 2022-07-12
  Administered 2022-07-12: 13 mL via INTRAVENOUS

## 2022-07-17 ENCOUNTER — Telehealth: Payer: Self-pay | Admitting: *Deleted

## 2022-07-17 ENCOUNTER — Encounter: Payer: Self-pay | Admitting: Urology

## 2022-07-17 ENCOUNTER — Inpatient Hospital Stay: Payer: Medicare HMO | Attending: Internal Medicine

## 2022-07-17 DIAGNOSIS — Z9221 Personal history of antineoplastic chemotherapy: Secondary | ICD-10-CM | POA: Insufficient documentation

## 2022-07-17 DIAGNOSIS — K449 Diaphragmatic hernia without obstruction or gangrene: Secondary | ICD-10-CM | POA: Insufficient documentation

## 2022-07-17 DIAGNOSIS — I252 Old myocardial infarction: Secondary | ICD-10-CM | POA: Insufficient documentation

## 2022-07-17 DIAGNOSIS — Z888 Allergy status to other drugs, medicaments and biological substances status: Secondary | ICD-10-CM | POA: Insufficient documentation

## 2022-07-17 DIAGNOSIS — Z880 Allergy status to penicillin: Secondary | ICD-10-CM | POA: Insufficient documentation

## 2022-07-17 DIAGNOSIS — Z923 Personal history of irradiation: Secondary | ICD-10-CM | POA: Insufficient documentation

## 2022-07-17 DIAGNOSIS — Z882 Allergy status to sulfonamides status: Secondary | ICD-10-CM | POA: Insufficient documentation

## 2022-07-17 DIAGNOSIS — I6782 Cerebral ischemia: Secondary | ICD-10-CM | POA: Insufficient documentation

## 2022-07-17 DIAGNOSIS — C7931 Secondary malignant neoplasm of brain: Secondary | ICD-10-CM | POA: Insufficient documentation

## 2022-07-17 DIAGNOSIS — E785 Hyperlipidemia, unspecified: Secondary | ICD-10-CM | POA: Insufficient documentation

## 2022-07-17 DIAGNOSIS — K314 Gastric diverticulum: Secondary | ICD-10-CM | POA: Insufficient documentation

## 2022-07-17 DIAGNOSIS — K760 Fatty (change of) liver, not elsewhere classified: Secondary | ICD-10-CM | POA: Insufficient documentation

## 2022-07-17 DIAGNOSIS — Z5111 Encounter for antineoplastic chemotherapy: Secondary | ICD-10-CM | POA: Insufficient documentation

## 2022-07-17 DIAGNOSIS — Z8719 Personal history of other diseases of the digestive system: Secondary | ICD-10-CM | POA: Insufficient documentation

## 2022-07-17 DIAGNOSIS — K219 Gastro-esophageal reflux disease without esophagitis: Secondary | ICD-10-CM | POA: Insufficient documentation

## 2022-07-17 DIAGNOSIS — Z87440 Personal history of urinary (tract) infections: Secondary | ICD-10-CM | POA: Insufficient documentation

## 2022-07-17 DIAGNOSIS — R0609 Other forms of dyspnea: Secondary | ICD-10-CM | POA: Insufficient documentation

## 2022-07-17 DIAGNOSIS — E039 Hypothyroidism, unspecified: Secondary | ICD-10-CM | POA: Insufficient documentation

## 2022-07-17 DIAGNOSIS — J634 Siderosis: Secondary | ICD-10-CM | POA: Insufficient documentation

## 2022-07-17 DIAGNOSIS — J432 Centrilobular emphysema: Secondary | ICD-10-CM | POA: Insufficient documentation

## 2022-07-17 DIAGNOSIS — C3412 Malignant neoplasm of upper lobe, left bronchus or lung: Secondary | ICD-10-CM | POA: Insufficient documentation

## 2022-07-17 DIAGNOSIS — I700 Atherosclerosis of aorta: Secondary | ICD-10-CM | POA: Insufficient documentation

## 2022-07-17 DIAGNOSIS — Z79899 Other long term (current) drug therapy: Secondary | ICD-10-CM | POA: Insufficient documentation

## 2022-07-17 DIAGNOSIS — Z79890 Hormone replacement therapy (postmenopausal): Secondary | ICD-10-CM | POA: Insufficient documentation

## 2022-07-17 DIAGNOSIS — Z5112 Encounter for antineoplastic immunotherapy: Secondary | ICD-10-CM | POA: Insufficient documentation

## 2022-07-17 NOTE — Progress Notes (Signed)
Telephone appointment. I verified patient's identity and began nursing interview. Patient denies any headaches or dizziness but reports her usual SOB, worsened due to an old diagnosis of emphysema. As long as patient does not exert herself to much, her SOB is well managed. No other issues reported at this time.  Meaningful use complete.  Reminded patient of her 9:30am-07/18/22 telephone appointment w/ Ashlyn Bruning PA-C. I left my extension [phone] in case patient needs anything. Patient verbalized understanding.  Patient contact [phone]

## 2022-07-17 NOTE — Telephone Encounter (Signed)
Called patient to ask about altering telephone fu on 07-18-22 from 8:30 am to 9:30 am, spoke with patient and she agreed to this

## 2022-07-18 ENCOUNTER — Other Ambulatory Visit: Payer: Self-pay | Admitting: Internal Medicine

## 2022-07-18 ENCOUNTER — Ambulatory Visit
Admission: RE | Admit: 2022-07-18 | Discharge: 2022-07-18 | Disposition: A | Discharge status: Home / Self Care | Payer: Medicare HMO | Source: Ambulatory Visit | Attending: Urology | Admitting: Urology

## 2022-07-18 DIAGNOSIS — C7931 Secondary malignant neoplasm of brain: Secondary | ICD-10-CM | POA: Diagnosis not present

## 2022-07-18 DIAGNOSIS — C3490 Malignant neoplasm of unspecified part of unspecified bronchus or lung: Secondary | ICD-10-CM

## 2022-07-18 DIAGNOSIS — C3412 Malignant neoplasm of upper lobe, left bronchus or lung: Secondary | ICD-10-CM | POA: Diagnosis not present

## 2022-07-18 NOTE — Progress Notes (Signed)
Radiation Oncology         [phone]) [phone] ________________________________  Name: Jill [name] MRN: [id number]  Date: 07/18/2022  DOB: [birth year]  Post Treatment Note  CC: [name], MD  Curt Bears, MD  Diagnosis:   71 yo woman with stage IV NSCLC, adenocarcinoma of the left upper lung - Stage IVA - with brain metastases     Interval Since Last Radiation: 10 months 09/28/21: SRS//PTV2-3: The metastatic lesions  in the left occipital (3 mm) and left temporal (1 mm) were treated to 20 Gy in a single fraction  06/23/21: SRS// PTV1: The solitary 7 mm left frontal  brain metastasis was treated to 20 Gy in a single fraction   06/15/21 - 08/01/21: The primary tumor in the left lung and nodes were treated to 66 Gy in 33 fractions of 2 Gy (concurrent with chemotherapy).  Narrative:  I contacted the patient to conduct her routine scheduled 3 month follow up visit to review results of her recent MRI brain scan via telephone to spare the patient unnecessary potential exposure in the healthcare setting during the current COVID-19 pandemic.  The patient was notified in advance and gave permission to proceed with this visit format.    She tolerated her SRS treatments well and she remains without complaints.  Her recent MRI brain scan from 07/12/2022 shows a stable appearance of a 4 mm focus of enhancement in the left frontal lobe (previously treated lesion) and no visible sequela of previously treated lesions in the left temporal lobe and left occipital lobe.  There are no new lesions.  We reviewed these results today.                        On review of systems, the patient states that she is doing well in general and is currently without complaints aside from allergies and a mild, persistent cough that is unchanged recently.  She specifically denies chest pain, increased shortness of breath or hemoptysis.  She reports a healthy appetite and is maintaining her weight.  She denies dysphagia,  abdominal pain, nausea, vomiting or night sweats.  She has occasional, mild headaches but nothing that has been persistent or concerning.  She denies any decrease in her visual or auditory acuity, dizziness/imbalance, focal weakness in the upper or lower extremities, tremor or seizure activity.  She initially completed 6 cycles of systemic therapy with carboplatin/paclitaxel systemic chemotherapy prior to switching to carboplatin, Alimta and Keytruda which she completed 5 cycles of. She is now on maintenance therapy with Alimta/Keytruda since 12/29/2021 and continues to  tolerate this well.  Her recent restaging CT C/A/P from 06/25/22 was stable in appearance and without evidence of metastatic disease. Overall, she is pleased with her progress to date.  Her 12th grandchild, 5th grand-daughter, was born 11/02/2021 and is now sitting up on her own.  ALLERGIES:  is allergic to varenicline, ace inhibitors, prednisone, betalin 12 [vitamin b12], penicillins, and sulfa drugs cross reactors.  Meds: Current Outpatient Medications  Medication Sig Dispense Refill   acetaminophen (TYLENOL) 500 MG tablet Take 500 mg by mouth every 6 (six) hours as needed.     amLODipine (NORVASC) 10 MG tablet Take 1 tablet by mouth once daily 90 tablet 1   amoxicillin-clavulanate (AUGMENTIN) 875-125 MG tablet Take 1 tablet by mouth 2 (two) times daily. (Patient not taking: Reported on 10/26/2021) 12 tablet 0   aspirin EC 81 MG tablet Take 1 tablet (81 mg total)  by mouth daily. 90 tablet 2   atorvastatin (LIPITOR) 40 MG tablet Take 1 tablet (40 mg total) by mouth daily. 90 tablet 1   Cholecalciferol (VITAMIN D3) 250 MCG (10000 UT) capsule Take 10,000 mcg by mouth daily.     diphenhydrAMINE HCl (BENADRYL ALLERGY PO) Take 25 mg by mouth at bedtime as needed (for sleep).     escitalopram (LEXAPRO) 10 MG tablet Take 1 tablet by mouth once daily 90 tablet 1   folic acid (FOLVITE) 1 MG tablet Take 1 tablet by mouth once daily 30 tablet 0    levothyroxine (SYNTHROID) 137 MCG tablet TAKE 1 TABLET BY MOUTH ONCE DAILY BEFORE BREAKFAST 30 tablet 2   losartan (COZAAR) 100 MG tablet Take 1 tablet by mouth once daily 90 tablet 1   metoprolol succinate (TOPROL-XL) 50 MG 24 hr tablet TAKE 1 TABLET BY MOUTH ONCE DAILY WITH A MEAL OR  IMMEDIATELY  FOLLOWING 90 tablet 1   Multiple Vitamins-Minerals (MULTIVITAMIN WITH MINERALS) tablet Take 1 tablet by mouth daily.     nitroGLYCERIN (NITROSTAT) 0.4 MG SL tablet Place 1 tablet (0.4 mg total) under the tongue every 5 (five) minutes as needed. For chest pain (Patient not taking: Reported on 10/26/2021) 25 tablet 6   prochlorperazine (COMPAZINE) 10 MG tablet Take 1 tablet (10 mg total) by mouth every 6 (six) hours as needed for nausea or vomiting. (Patient not taking: Reported on 10/26/2021) 30 tablet 0   Vitamin A 2400 MCG (8000 UT) TABS Take 2,400 mg by mouth daily.     vitamin B-12 (CYANOCOBALAMIN) 1000 MCG tablet Take 1,000 mcg by mouth daily. (Patient not taking: Reported on 10/26/2021)     No current facility-administered medications for this encounter.    Physical Findings:  vitals were not taken for this visit.  Pain Assessment Pain Score: 0-No pain/10 Unable to assess due to telephone follow-up visit format.  Lab Findings: Lab Results  Component Value Date   WBC 5.0 06/28/2022   HGB 11.7 (L) 06/28/2022   HCT 35.2 (L) 06/28/2022   MCV 104.5 (H) 06/28/2022   PLT 323 06/28/2022     Radiographic Findings: MR Brain W Wo Contrast  Result Date: 07/13/2022 CLINICAL DATA:  Brain metastases, assess treatment response 3T SRS Protocol. Lung carcinoma. Status post radiation treatment EXAM: MRI HEAD WITHOUT AND WITH CONTRAST TECHNIQUE: Multiplanar, multiecho pulse sequences of the brain and surrounding structures were obtained without and with intravenous contrast. CONTRAST:  76mL MULTIHANCE GADOBENATE DIMEGLUMINE 529 MG/ML IV SOLN COMPARISON:  04/06/2022 FINDINGS: Brain: Unchanged  contrast-enhancing lesion of the paramedian left frontal lobe, 4 mm (13:132). No new contrast-enhancing lesions. No contrast enhancement at the left temporal and occipital treatment sites. There is no acute infarct or acute hemorrhage. There is multifocal periventricular white matter hyperintensity, most often a result of chronic microvascular ischemia. 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 There are unchanged chronic microhemorrhages in the left frontal lobe. Mild generalized volume loss. Vascular: Major flow voids are maintained. 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 Skull and upper cervical spine: Old right frontal burr hole Sinuses/Orbits: Right ocular lens replacement. Paranasal sinuses are clear. Other: None IMPRESSION: 1. Unchanged 4 mm contrast-enhancing lesion of the paramedian left frontal lobe. No new contrast-enhancing lesions. 2. Status post coiled basilar tip aneurysm. Electronically Signed   By: Ulyses Jarred M.D.   On: 07/13/2022 13:45   CT Chest W Contrast  Result Date: 06/27/2022 CLINICAL DATA:  Non-small cell lung cancer, staging, post chemotherapy and radiation therapy. Shortness of breath. * Tracking Code: BO * EXAM: CT CHEST, ABDOMEN, AND PELVIS WITH CONTRAST TECHNIQUE: Multidetector CT imaging of the chest, abdomen and pelvis was performed following the standard protocol during bolus administration of intravenous contrast. RADIATION DOSE REDUCTION: This exam was performed according to the departmental dose-optimization program which includes automated exposure control, adjustment of the mA and/or kV according to patient size and/or use of iterative reconstruction technique. CONTRAST:  1105mL OMNIPAQUE IOHEXOL 300 MG/ML  SOLN COMPARISON:  04/17/2022. FINDINGS: CT CHEST FINDINGS Cardiovascular: Atherosclerotic calcification of the aorta. Heart is at the upper limits of normal in size. Left ventricle is somewhat dilated.  No pericardial effusion. Mediastinum/Nodes: No pathologically enlarged mediastinal, hilar or axillary lymph nodes. Esophagus is grossly unremarkable. Lungs/Pleura: Biapical pleuroparenchymal scarring. Centrilobular emphysema. Spiculated consolidation in the posterior left upper lobe measures 2.1 x 2.6 cm (7/63), stable. Surrounding architectural distortion. Otherwise, no suspicious pulmonary nodules. No pleural fluid. Minimal debris in the airway. Musculoskeletal: Degenerative changes in the spine. No worrisome lytic or sclerotic lesions. CT ABDOMEN PELVIS FINDINGS Hepatobiliary: Liver is slightly decreased in attenuation diffusely. Subcentimeter low-attenuation lesions, too small to characterize. No specific follow-up necessary. Gallbladder unremarkable. Low-attenuation lesion in the porta hepatis measures 2.3 x 3.2 cm, stable and possibly a choledochal cyst. No biliary ductal dilatation. Pancreas: Negative. Spleen: Negative. Adrenals/Urinary Tract: Adrenal glands are unremarkable. Low-attenuation lesions in the kidneys measure up to 2.6 cm on the left and are likely cysts. No specific follow-up necessary. 1.5 cm mildly hyperdense lesion off the upper pole left kidney measures 15 Hounsfield units. On PET, lesion measures 32 Hounsfield units. A hyperdense cyst is likely. No specific follow-up necessary. Ureters are decompressed. There may be slight anterolateral bladder wall thickening, similar. Stomach/Bowel: Tiny hiatal hernia. Posterior gastric diverticulum. Stomach, small bowel, appendix and colon are unremarkable. Vascular/Lymphatic: Atherosclerotic calcification of the aorta. No pathologically enlarged lymph nodes. Reproductive: Uterus is visualized.  No adnexal mass. Other: No free fluid.  Mesenteries and peritoneum are unremarkable. Musculoskeletal: Degenerative changes in the spine and hips. Difficult to definitively exclude mild changes of avascular necrosis in the femoral heads bilaterally. IMPRESSION: 1.  Spiculated consolidation in the left upper lobe, stable and compatible with treated bronchogenic carcinoma. No evidence of metastatic disease. 2. Hepatic steatosis. 3. Possible choledochal cyst, stable. 4.  Aortic atherosclerosis (ICD10-I70.0). 5.  Emphysema (ICD10-J43.9). Electronically Signed   By: Lorin Picket M.D.   On: 06/27/2022 08:23   CT Abdomen Pelvis W Contrast  Result Date: 06/27/2022 CLINICAL DATA:  Non-small cell lung cancer, staging, post chemotherapy and radiation therapy. Shortness of breath. * Tracking Code: BO * EXAM: CT CHEST, ABDOMEN, AND PELVIS WITH CONTRAST TECHNIQUE: Multidetector CT imaging of the chest, abdomen and pelvis was performed following the standard protocol during bolus administration of intravenous contrast. RADIATION DOSE REDUCTION: This exam was performed according to the departmental dose-optimization program which includes automated exposure control, adjustment of the mA and/or kV according to patient size and/or use of iterative reconstruction technique. CONTRAST:  144mL OMNIPAQUE IOHEXOL 300 MG/ML  SOLN COMPARISON:  04/17/2022. FINDINGS: CT CHEST FINDINGS Cardiovascular: Atherosclerotic calcification of the aorta. Heart is at the upper limits of normal in size. Left ventricle is somewhat dilated. No pericardial effusion. Mediastinum/Nodes: No pathologically enlarged mediastinal, hilar or axillary lymph nodes. Esophagus is grossly unremarkable. Lungs/Pleura: Biapical pleuroparenchymal scarring. Centrilobular emphysema. Spiculated consolidation in the posterior left upper lobe measures 2.1 x 2.6 cm (7/63), stable. Surrounding architectural distortion. Otherwise,  no suspicious pulmonary nodules. No pleural fluid. Minimal debris in the airway. Musculoskeletal: Degenerative changes in the spine. No worrisome lytic or sclerotic lesions. CT ABDOMEN PELVIS FINDINGS Hepatobiliary: Liver is slightly decreased in attenuation diffusely. Subcentimeter low-attenuation lesions, too  small to characterize. No specific follow-up necessary. Gallbladder unremarkable. Low-attenuation lesion in the porta hepatis measures 2.3 x 3.2 cm, stable and possibly a choledochal cyst. No biliary ductal dilatation. Pancreas: Negative. Spleen: Negative. Adrenals/Urinary Tract: Adrenal glands are unremarkable. Low-attenuation lesions in the kidneys measure up to 2.6 cm on the left and are likely cysts. No specific follow-up necessary. 1.5 cm mildly hyperdense lesion off the upper pole left kidney measures 15 Hounsfield units. On PET, lesion measures 32 Hounsfield units. A hyperdense cyst is likely. No specific follow-up necessary. Ureters are decompressed. There may be slight anterolateral bladder wall thickening, similar. Stomach/Bowel: Tiny hiatal hernia. Posterior gastric diverticulum. Stomach, small bowel, appendix and colon are unremarkable. Vascular/Lymphatic: Atherosclerotic calcification of the aorta. No pathologically enlarged lymph nodes. Reproductive: Uterus is visualized.  No adnexal mass. Other: No free fluid.  Mesenteries and peritoneum are unremarkable. Musculoskeletal: Degenerative changes in the spine and hips. Difficult to definitively exclude mild changes of avascular necrosis in the femoral heads bilaterally. IMPRESSION: 1. Spiculated consolidation in the left upper lobe, stable and compatible with treated bronchogenic carcinoma. No evidence of metastatic disease. 2. Hepatic steatosis. 3. Possible choledochal cyst, stable. 4.  Aortic atherosclerosis (ICD10-I70.0). 5.  Emphysema (ICD10-J43.9). Electronically Signed   By: Lorin Picket M.D.   On: 06/27/2022 08:23    Impression/Plan: 47. 71 yo woman with brain metastasis secondary to metastatic, NSCLC, adenocarcinoma of the left upper lung - Stage IVA. She has recovered well from the effects of her recent [facility] radiotherapy and remains without complaints.  She continues on maintenance therapy with Alimta and Keytruda which she is tolerating  well.   Her recent MRI brain scan from 07/12/2022 shows a stable appearance of a 4 mm focus of enhancement in the left frontal lobe (previously treated lesion) and no visible sequela of previously treated lesions in the left temporal lobe and left occipital lobe.There are no new lesions to suggest disease recurrence or progression.  She will continue management of her systemic disease under the care and direction of Dr. Earlie Server and is scheduled for a follow up visit on 07/19/22.  Regarding the brain metastases, we will continue to monitor closely with serial MRI brain scans every 3 months going forward and plan to connect by telephone following each scan to review results and recommendations from the multidisciplinary brain tumor board.  She appears to have a good understanding of these recommendations and is comfortable and in agreement with the stated plan.  She knows to call at anytime in the interim with any questions or concerns related to her previous radiation.   I personally spent 20 minutes in this encounter including chart review, reviewing radiological studies, telephone discussion with the patient, entering orders and completing documentation.     Nicholos Johns, PA-C

## 2022-07-19 ENCOUNTER — Inpatient Hospital Stay: Payer: Medicare HMO

## 2022-07-19 ENCOUNTER — Other Ambulatory Visit: Payer: Self-pay

## 2022-07-19 ENCOUNTER — Encounter: Payer: Self-pay | Admitting: Internal Medicine

## 2022-07-19 ENCOUNTER — Inpatient Hospital Stay (HOSPITAL_BASED_OUTPATIENT_CLINIC_OR_DEPARTMENT_OTHER): Payer: Medicare HMO | Admitting: Internal Medicine

## 2022-07-19 DIAGNOSIS — J634 Siderosis: Secondary | ICD-10-CM | POA: Diagnosis not present

## 2022-07-19 DIAGNOSIS — C7931 Secondary malignant neoplasm of brain: Secondary | ICD-10-CM | POA: Diagnosis not present

## 2022-07-19 DIAGNOSIS — I252 Old myocardial infarction: Secondary | ICD-10-CM | POA: Diagnosis not present

## 2022-07-19 DIAGNOSIS — K219 Gastro-esophageal reflux disease without esophagitis: Secondary | ICD-10-CM | POA: Diagnosis not present

## 2022-07-19 DIAGNOSIS — C3412 Malignant neoplasm of upper lobe, left bronchus or lung: Secondary | ICD-10-CM

## 2022-07-19 DIAGNOSIS — R0609 Other forms of dyspnea: Secondary | ICD-10-CM | POA: Diagnosis not present

## 2022-07-19 DIAGNOSIS — Z9221 Personal history of antineoplastic chemotherapy: Secondary | ICD-10-CM | POA: Diagnosis not present

## 2022-07-19 DIAGNOSIS — Z8719 Personal history of other diseases of the digestive system: Secondary | ICD-10-CM | POA: Diagnosis not present

## 2022-07-19 DIAGNOSIS — Z880 Allergy status to penicillin: Secondary | ICD-10-CM | POA: Diagnosis not present

## 2022-07-19 DIAGNOSIS — Z923 Personal history of irradiation: Secondary | ICD-10-CM | POA: Diagnosis not present

## 2022-07-19 DIAGNOSIS — E785 Hyperlipidemia, unspecified: Secondary | ICD-10-CM | POA: Diagnosis not present

## 2022-07-19 DIAGNOSIS — K449 Diaphragmatic hernia without obstruction or gangrene: Secondary | ICD-10-CM | POA: Diagnosis not present

## 2022-07-19 DIAGNOSIS — Z87440 Personal history of urinary (tract) infections: Secondary | ICD-10-CM | POA: Diagnosis not present

## 2022-07-19 DIAGNOSIS — I6782 Cerebral ischemia: Secondary | ICD-10-CM | POA: Diagnosis not present

## 2022-07-19 DIAGNOSIS — I700 Atherosclerosis of aorta: Secondary | ICD-10-CM | POA: Diagnosis not present

## 2022-07-19 DIAGNOSIS — Z5112 Encounter for antineoplastic immunotherapy: Secondary | ICD-10-CM | POA: Diagnosis present

## 2022-07-19 DIAGNOSIS — K760 Fatty (change of) liver, not elsewhere classified: Secondary | ICD-10-CM | POA: Diagnosis not present

## 2022-07-19 DIAGNOSIS — K314 Gastric diverticulum: Secondary | ICD-10-CM | POA: Diagnosis not present

## 2022-07-19 DIAGNOSIS — J432 Centrilobular emphysema: Secondary | ICD-10-CM | POA: Diagnosis not present

## 2022-07-19 DIAGNOSIS — Z79890 Hormone replacement therapy (postmenopausal): Secondary | ICD-10-CM | POA: Diagnosis not present

## 2022-07-19 DIAGNOSIS — Z882 Allergy status to sulfonamides status: Secondary | ICD-10-CM | POA: Diagnosis not present

## 2022-07-19 DIAGNOSIS — Z79899 Other long term (current) drug therapy: Secondary | ICD-10-CM | POA: Diagnosis not present

## 2022-07-19 DIAGNOSIS — E039 Hypothyroidism, unspecified: Secondary | ICD-10-CM | POA: Diagnosis not present

## 2022-07-19 DIAGNOSIS — Z5111 Encounter for antineoplastic chemotherapy: Secondary | ICD-10-CM | POA: Diagnosis present

## 2022-07-19 LAB — CBC WITH DIFFERENTIAL (CANCER CENTER ONLY)
Abs Immature Granulocytes: 0.01 10*3/uL (ref 0.00–0.07)
Basophils Absolute: 0.1 10*3/uL (ref 0.0–0.1)
Basophils Relative: 2 %
Eosinophils Absolute: 0.2 10*3/uL (ref 0.0–0.5)
Eosinophils Relative: 3 %
HCT: 37.6 % (ref 36.0–46.0)
Hemoglobin: 12.3 g/dL (ref 12.0–15.0)
Immature Granulocytes: 0 %
Lymphocytes Relative: 26 %
Lymphs Abs: 1.5 10*3/uL (ref 0.7–4.0)
MCH: 34.5 pg — ABNORMAL HIGH (ref 26.0–34.0)
MCHC: 32.7 g/dL (ref 30.0–36.0)
MCV: 105.3 fL — ABNORMAL HIGH (ref 80.0–100.0)
Monocytes Absolute: 0.6 10*3/uL (ref 0.1–1.0)
Monocytes Relative: 11 %
Neutro Abs: 3.3 10*3/uL (ref 1.7–7.7)
Neutrophils Relative %: 58 %
Platelet Count: 333 10*3/uL (ref 150–400)
RBC: 3.57 MIL/uL — ABNORMAL LOW (ref 3.87–5.11)
RDW: 14.5 % (ref 11.5–15.5)
WBC Count: 5.7 10*3/uL (ref 4.0–10.5)
nRBC: 0 % (ref 0.0–0.2)

## 2022-07-19 LAB — CMP (CANCER CENTER ONLY)
ALT: 26 U/L (ref 0–44)
AST: 35 U/L (ref 15–41)
Albumin: 4 g/dL (ref 3.5–5.0)
Alkaline Phosphatase: 73 U/L (ref 38–126)
Anion gap: 4 — ABNORMAL LOW (ref 5–15)
BUN: 15 mg/dL (ref 8–23)
CO2: 32 mmol/L (ref 22–32)
Calcium: 9.6 mg/dL (ref 8.9–10.3)
Chloride: 105 mmol/L (ref 98–111)
Creatinine: 1.05 mg/dL — ABNORMAL HIGH (ref 0.44–1.00)
GFR, Estimated: 56 mL/min — ABNORMAL LOW (ref >60)
Glucose, Bld: 114 mg/dL — ABNORMAL HIGH (ref 70–99)
Potassium: 4.1 mmol/L (ref 3.5–5.1)
Sodium: 141 mmol/L (ref 135–145)
Total Bilirubin: 0.3 mg/dL (ref 0.3–1.2)
Total Protein: 7.1 g/dL (ref 6.5–8.1)

## 2022-07-19 LAB — TSH: TSH: 14.803 u[IU]/mL — ABNORMAL HIGH (ref 0.350–4.500)

## 2022-07-19 MED ORDER — SODIUM CHLORIDE 0.9 % IV SOLN: Freq: Once | INTRAVENOUS | Status: AC

## 2022-07-19 MED ORDER — CYANOCOBALAMIN 1000 MCG/ML IJ SOLN
1000.0000 ug | Freq: Once | INTRAMUSCULAR | Status: AC
  Administered 2022-07-19: 1000 ug via INTRAMUSCULAR
  Filled 2022-07-19: qty 1

## 2022-07-19 MED ORDER — SODIUM CHLORIDE 0.9 % IV SOLN
500.0000 mg/m2 | Freq: Once | INTRAVENOUS | Status: AC
  Administered 2022-07-19: 900 mg via INTRAVENOUS
  Filled 2022-07-19: qty 20

## 2022-07-19 MED ORDER — PROCHLORPERAZINE MALEATE 10 MG PO TABS
10.0000 mg | ORAL_TABLET | Freq: Once | ORAL | Status: AC
  Administered 2022-07-19: 10 mg via ORAL
  Filled 2022-07-19: qty 1

## 2022-07-19 MED ORDER — SODIUM CHLORIDE 0.9 % IV SOLN
200.0000 mg | Freq: Once | INTRAVENOUS | Status: AC
  Administered 2022-07-19: 200 mg via INTRAVENOUS
  Filled 2022-07-19: qty 200

## 2022-07-19 NOTE — Patient Instructions (Signed)
[facility]  Discharge Instructions: Thank you for choosing [facility] to provide your oncology and hematology care.   If you have a lab appointment with the [facility], please go directly to the [facility] and check in at the registration area.   Wear comfortable clothing and clothing appropriate for easy access to any Portacath or PICC line.   We strive to give you quality time with your provider. You may need to reschedule your appointment if you arrive late (15 or more minutes).  Arriving late affects you and other patients whose appointments are after yours.  Also, if you miss three or more appointments without notifying the office, you may be dismissed from the clinic at the provider's discretion.      For prescription refill requests, have your pharmacy contact our office and allow 72 hours for refills to be completed.    Today you received the following chemotherapy and/or immunotherapy agents: Keytruda and Alimta      To help prevent nausea and vomiting after your treatment, we encourage you to take your nausea medication as directed.  BELOW ARE SYMPTOMS THAT SHOULD BE REPORTED IMMEDIATELY: *FEVER GREATER THAN 100.4 F (38 C) OR HIGHER *CHILLS OR SWEATING *NAUSEA AND VOMITING THAT IS NOT CONTROLLED WITH YOUR NAUSEA MEDICATION *UNUSUAL SHORTNESS OF BREATH *UNUSUAL BRUISING OR BLEEDING *URINARY PROBLEMS (pain or burning when urinating, or frequent urination) *BOWEL PROBLEMS (unusual diarrhea, constipation, pain near the anus) TENDERNESS IN MOUTH AND THROAT WITH OR WITHOUT PRESENCE OF ULCERS (sore throat, sores in mouth, or a toothache) UNUSUAL RASH, SWELLING OR PAIN  UNUSUAL VAGINAL DISCHARGE OR ITCHING   Items with * indicate a potential emergency and should be followed up as soon as possible or go to the Emergency Department if any problems should occur.  Please show the CHEMOTHERAPY ALERT CARD or IMMUNOTHERAPY ALERT CARD at  check-in to the Emergency Department and triage nurse.  Should you have questions after your visit or need to cancel or reschedule your appointment, please contact [facility]  Dept: [phone]  and follow the prompts.  Office hours are 8:00 a.m. to 4:30 p.m. Monday - Friday. Please note that voicemails left after 4:00 p.m. may not be returned until the following business day.  We are closed weekends and major holidays. You have access to a nurse at all times for urgent questions. Please call the main number to the clinic Dept: [phone] and follow the prompts.   For any non-urgent questions, you may also contact your provider using MyChart. We now offer e-Visits for anyone 7 and older to request care online for non-urgent symptoms. For details visit [email or web address].   Also download the MyChart app! Go to the app store, search "MyChart", open the app, select [facility], and log in with your MyChart username and password.  Masks are optional in the cancer centers. If you would like for your care team to wear a mask while they are taking care of you, please let them know. For doctor visits, patients may have with them one support Jenasis Straley who is at least 73 years old. At this time, visitors are not allowed in the infusion area.

## 2022-07-19 NOTE — Progress Notes (Signed)
[place] [phone]   [phone]  OFFICE PROGRESS NOTE  [name], MD [place] [address]  DIAGNOSIS: Stage IV (T2 a, N2, M1b) non-small cell lung cancer, adenocarcinoma presented with left upper lobe lung mass in addition to left hilar and precarinal metastatic adenopathy and small left lower lobe pulmonary nodule in addition to solitary left frontal brain metastasis diagnosed in July 2022.  Molecular studies by [employer] showed  Positive KRAS G12C mutation PD-L1 expression was less than 1%   PRIOR THERAPY:  1) Status post SRS to the solitary brain metastasis. 20 Treatment for the locally advanced disease in the chest with carboplatin for AUC of 2 and paclitaxel 45 Mg/M2.  Status post 6 cycles.  Last dose was given 07/25/2021.  CURRENT THERAPY: Systemic chemotherapy with carboplatin for AUC of 5, Alimta 500 Mg/M2 and Keytruda 200 Mg IV every 3 weeks.  First dose August 31, 2021.  Status post 14 cycles.  Starting from cycle #5 the patient will be on maintenance treatment with Alimta and Keytruda every 3 weeks.  INTERVAL HISTORY: Jill [name] 71 y.o. female returns to the clinic today for follow-up visit.  The patient is feeling fine today with no concerning complaints except for occasional shortness of breath with exertion and mild cough with no chest pain or hemoptysis.  She has no nausea, vomiting, diarrhea or constipation.  She has no headache or visual changes.  She denied having any recent weight loss or night sweats.  She continues to tolerate her treatment with maintenance Alimta and Keytruda fairly well.  She is here today for evaluation before starting cycle #15.  MEDICAL HISTORY: Past Medical History:  Diagnosis Date   Anxiety    ON PAXIL, XANAX   AVM (arteriovenous malformation) brain    s/p stent/coil   Blood transfusion    x 2   Cancer (HCC)    Left lung   COPD (chronic obstructive  pulmonary disease) (HCC)    no inhaler, no oxygen   Coronary artery disease    Prior inferior MI with stent to RCA, s/p CABG in 2008   Depression    Dizziness    Dyspnea    with exertion, no oxygen   Fatigue    Foot injury 06/01/2017   right - RESOLVED, no longer an issue per patient 05/18/21   GERD (gastroesophageal reflux disease)    Headache(784.0)    UNRUPTURED CEREBRAL ANEURYSM   Hyperlipidemia    Hypertension    Hypothyroidism    Infected cyst of skin 09/18/2013   Left knee pain 06/05/2012   Lung mass    Left lung   Myocardial infarction ([place])    Normal nuclear stress test Ju;y 2012   No ischemia. EF 70%; fixed defect involving septum, inferoseptal and inferior wall   Pain, dental 02/06/2017   Poor dental hygiene    Retroperitoneal bleeding    Following cardiac cath   Tobacco abuse    Urine discoloration 09/18/2013   Urine incontinence 07/06/2020   UTI (lower urinary tract infection) 10/16/2013    ALLERGIES:  is allergic to varenicline, ace inhibitors, prednisone, betalin 12 [vitamin b12], penicillins, and sulfa drugs cross reactors.  MEDICATIONS:  Current Outpatient Medications  Medication Sig Dispense Refill   acetaminophen (TYLENOL) 500 MG tablet Take 500 mg by mouth every 6 (six) hours as needed.     amLODipine (NORVASC) 10 MG tablet Take 1 tablet by mouth once  daily 90 tablet 1   amoxicillin-clavulanate (AUGMENTIN) 875-125 MG tablet Take 1 tablet by mouth 2 (two) times daily. (Patient not taking: Reported on 10/26/2021) 12 tablet 0   aspirin EC 81 MG tablet Take 1 tablet (81 mg total) by mouth daily. 90 tablet 2   atorvastatin (LIPITOR) 40 MG tablet Take 1 tablet (40 mg total) by mouth daily. 90 tablet 1   Cholecalciferol (VITAMIN D3) 250 MCG (10000 UT) capsule Take 10,000 mcg by mouth daily.     diphenhydrAMINE HCl (BENADRYL ALLERGY PO) Take 25 mg by mouth at bedtime as needed (for sleep).     escitalopram (LEXAPRO) 10 MG tablet Take 1 tablet by mouth once  daily 90 tablet 1   folic acid (FOLVITE) 1 MG tablet Take 1 tablet by mouth once daily 30 tablet 0   levothyroxine (SYNTHROID) 137 MCG tablet TAKE 1 TABLET BY MOUTH ONCE DAILY BEFORE BREAKFAST 30 tablet 2   losartan (COZAAR) 100 MG tablet Take 1 tablet by mouth once daily 90 tablet 1   metoprolol succinate (TOPROL-XL) 50 MG 24 hr tablet TAKE 1 TABLET BY MOUTH ONCE DAILY WITH A MEAL OR  IMMEDIATELY  FOLLOWING 90 tablet 1   Multiple Vitamins-Minerals (MULTIVITAMIN WITH MINERALS) tablet Take 1 tablet by mouth daily.     nitroGLYCERIN (NITROSTAT) 0.4 MG SL tablet Place 1 tablet (0.4 mg total) under the tongue every 5 (five) minutes as needed. For chest pain (Patient not taking: Reported on 10/26/2021) 25 tablet 6   prochlorperazine (COMPAZINE) 10 MG tablet Take 1 tablet (10 mg total) by mouth every 6 (six) hours as needed for nausea or vomiting. (Patient not taking: Reported on 10/26/2021) 30 tablet 0   Vitamin A 2400 MCG (8000 UT) TABS Take 2,400 mg by mouth daily.     vitamin B-12 (CYANOCOBALAMIN) 1000 MCG tablet Take 1,000 mcg by mouth daily. (Patient not taking: Reported on 10/26/2021)     No current facility-administered medications for this visit.    SURGICAL HISTORY:  Past Surgical History:  Procedure Laterality Date   CARDIAC CATHETERIZATION  01/02/2007   IT REVEALS MILD INFERIOR WALL HYPOKINESIS. THE EJECTION FRACTION IS AROUND 50%   COLONOSCOPY     CORONARY ARTERY BYPASS GRAFT  11/06/2006   LIMA to LAD, SVG to DX, SVG to LCX & SVG to OM 1 & 2, and SVG to PD   CORONARY STENT PLACEMENT     Remote past stent to RCA   EYE SURGERY Right    cataracts removed   HEMORRHOID SURGERY  11/07/1987   UPPER GI ENDOSCOPY     VENTRICULOSTOMY  10/06/2011   Procedure: VENTRICULOSTOMY;  Surgeon: Winfield Cunas;  Location: [facility];  Service: Neurosurgery;  Laterality: Right;  Insertion of Ventriculostomy Catheter   VIDEO BRONCHOSCOPY WITH ENDOBRONCHIAL NAVIGATION Left 05/20/2021   Procedure:  VIDEO BRONCHOSCOPY WITH ENDOBRONCHIAL NAVIGATION;  Surgeon: Garner Nash, DO;  Location: [facility];  Service: Pulmonary;  Laterality: Left;   VIDEO BRONCHOSCOPY WITH ENDOBRONCHIAL ULTRASOUND Bilateral 05/20/2021   Procedure: VIDEO BRONCHOSCOPY WITH ENDOBRONCHIAL ULTRASOUND;  Surgeon: Garner Nash, DO;  Location: [facility];  Service: Pulmonary;  Laterality: Bilateral;   WISDOM TOOTH EXTRACTION      REVIEW OF SYSTEMS:  A comprehensive review of systems was negative except for: Respiratory: positive for dyspnea on exertion   PHYSICAL EXAMINATION: General appearance: alert, cooperative, and no distress Head: Normocephalic, without obvious abnormality, atraumatic Neck: no adenopathy, no JVD, supple, symmetrical, trachea midline, and thyroid not enlarged, symmetric,  no tenderness/mass/nodules Lymph nodes: Cervical, supraclavicular, and axillary nodes normal. Resp: clear to auscultation bilaterally Back: symmetric, no curvature. ROM normal. No CVA tenderness. Cardio: regular rate and rhythm, S1, S2 normal, no murmur, click, rub or gallop GI: soft, non-tender; bowel sounds normal; no masses,  no organomegaly Extremities: extremities normal, atraumatic, no cyanosis or edema  ECOG PERFORMANCE STATUS: 1 - Symptomatic but completely ambulatory  Blood pressure 129/69, pulse 67, temperature 97.9 F (36.6 C), temperature source Oral, resp. rate 16, weight 139 lb 9 oz (63.3 kg), SpO2 93 %.  LABORATORY DATA: Lab Results  Component Value Date   WBC 5.0 06/28/2022   HGB 11.7 (L) 06/28/2022   HCT 35.2 (L) 06/28/2022   MCV 104.5 (H) 06/28/2022   PLT 323 06/28/2022      Chemistry      Component Value Date/Time   NA 140 06/28/2022 0759   NA 141 04/12/2021 1356   K 3.7 06/28/2022 0759   CL 105 06/28/2022 0759   CO2 29 06/28/2022 0759   BUN 8 06/28/2022 0759   BUN 13 04/12/2021 1356   CREATININE 0.93 06/28/2022 0759   CREATININE 0.99 08/29/2016 1130      Component Value Date/Time   CALCIUM 9.5  06/28/2022 0759   ALKPHOS 78 06/28/2022 0759   AST 28 06/28/2022 0759   ALT 18 06/28/2022 0759   BILITOT 0.4 06/28/2022 0759       RADIOGRAPHIC STUDIES: MR Brain W Wo Contrast  Result Date: 07/13/2022 CLINICAL DATA:  Brain metastases, assess treatment response 3T SRS Protocol. Lung carcinoma. Status post radiation treatment EXAM: MRI HEAD WITHOUT AND WITH CONTRAST TECHNIQUE: Multiplanar, multiecho pulse sequences of the brain and surrounding structures were obtained without and with intravenous contrast. CONTRAST:  25m MULTIHANCE GADOBENATE DIMEGLUMINE 529 MG/ML IV SOLN COMPARISON:  04/06/2022 FINDINGS: Brain: Unchanged contrast-enhancing lesion of the paramedian left frontal lobe, 4 mm (13:132). No new contrast-enhancing lesions. No contrast enhancement at the left temporal and occipital treatment sites. There is no acute infarct or acute hemorrhage. There is multifocal periventricular white matter hyperintensity, most often a result of chronic microvascular ischemia. 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 There are unchanged chronic microhemorrhages in the left frontal lobe. Mild generalized volume loss. Vascular: Major flow voids are maintained. 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 Skull and upper cervical spine: Old right frontal burr hole Sinuses/Orbits: Right ocular lens replacement. Paranasal sinuses are clear. Other: None IMPRESSION: 1. Unchanged 4 mm contrast-enhancing lesion of the paramedian left frontal lobe. No new contrast-enhancing lesions. 2. Status post coiled basilar tip aneurysm. Electronically Signed   By: KUlyses JarredM.D.   On: 07/13/2022 13:45   CT Chest W Contrast  Result Date: 06/27/2022 CLINICAL DATA:  Non-small cell lung cancer, staging, post chemotherapy and radiation therapy. Shortness of breath. * Tracking Code: BO * EXAM: CT CHEST, ABDOMEN, AND PELVIS WITH CONTRAST TECHNIQUE: Multidetector CT  imaging of the chest, abdomen and pelvis was performed following the standard protocol during bolus administration of intravenous contrast. RADIATION DOSE REDUCTION: This exam was performed according to the departmental dose-optimization program which includes automated exposure control, adjustment of the mA and/or kV according to patient size and/or use of iterative reconstruction technique. CONTRAST:  1041mOMNIPAQUE IOHEXOL 300 MG/ML  SOLN COMPARISON:  04/17/2022. FINDINGS: CT CHEST FINDINGS Cardiovascular: Atherosclerotic calcification of the aorta. Heart is at the upper limits of normal in size. Left ventricle is somewhat dilated. No pericardial  effusion. Mediastinum/Nodes: No pathologically enlarged mediastinal, hilar or axillary lymph nodes. Esophagus is grossly unremarkable. Lungs/Pleura: Biapical pleuroparenchymal scarring. Centrilobular emphysema. Spiculated consolidation in the posterior left upper lobe measures 2.1 x 2.6 cm (7/63), stable. Surrounding architectural distortion. Otherwise, no suspicious pulmonary nodules. No pleural fluid. Minimal debris in the airway. Musculoskeletal: Degenerative changes in the spine. No worrisome lytic or sclerotic lesions. CT ABDOMEN PELVIS FINDINGS Hepatobiliary: Liver is slightly decreased in attenuation diffusely. Subcentimeter low-attenuation lesions, too small to characterize. No specific follow-up necessary. Gallbladder unremarkable. Low-attenuation lesion in the porta hepatis measures 2.3 x 3.2 cm, stable and possibly a choledochal cyst. No biliary ductal dilatation. Pancreas: Negative. Spleen: Negative. Adrenals/Urinary Tract: Adrenal glands are unremarkable. Low-attenuation lesions in the kidneys measure up to 2.6 cm on the left and are likely cysts. No specific follow-up necessary. 1.5 cm mildly hyperdense lesion off the upper pole left kidney measures 15 Hounsfield units. On PET, lesion measures 32 Hounsfield units. A hyperdense cyst is likely. No specific  follow-up necessary. Ureters are decompressed. There may be slight anterolateral bladder wall thickening, similar. Stomach/Bowel: Tiny hiatal hernia. Posterior gastric diverticulum. Stomach, small bowel, appendix and colon are unremarkable. Vascular/Lymphatic: Atherosclerotic calcification of the aorta. No pathologically enlarged lymph nodes. Reproductive: Uterus is visualized.  No adnexal mass. Other: No free fluid.  Mesenteries and peritoneum are unremarkable. Musculoskeletal: Degenerative changes in the spine and hips. Difficult to definitively exclude mild changes of avascular necrosis in the femoral heads bilaterally. IMPRESSION: 1. Spiculated consolidation in the left upper lobe, stable and compatible with treated bronchogenic carcinoma. No evidence of metastatic disease. 2. Hepatic steatosis. 3. Possible choledochal cyst, stable. 4.  Aortic atherosclerosis (ICD10-I70.0). 5.  Emphysema (ICD10-J43.9). Electronically Signed   By: Lorin Picket M.D.   On: 06/27/2022 08:23   CT Abdomen Pelvis W Contrast  Result Date: 06/27/2022 CLINICAL DATA:  Non-small cell lung cancer, staging, post chemotherapy and radiation therapy. Shortness of breath. * Tracking Code: BO * EXAM: CT CHEST, ABDOMEN, AND PELVIS WITH CONTRAST TECHNIQUE: Multidetector CT imaging of the chest, abdomen and pelvis was performed following the standard protocol during bolus administration of intravenous contrast. RADIATION DOSE REDUCTION: This exam was performed according to the departmental dose-optimization program which includes automated exposure control, adjustment of the mA and/or kV according to patient size and/or use of iterative reconstruction technique. CONTRAST:  160m OMNIPAQUE IOHEXOL 300 MG/ML  SOLN COMPARISON:  04/17/2022. FINDINGS: CT CHEST FINDINGS Cardiovascular: Atherosclerotic calcification of the aorta. Heart is at the upper limits of normal in size. Left ventricle is somewhat dilated. No pericardial effusion.  Mediastinum/Nodes: No pathologically enlarged mediastinal, hilar or axillary lymph nodes. Esophagus is grossly unremarkable. Lungs/Pleura: Biapical pleuroparenchymal scarring. Centrilobular emphysema. Spiculated consolidation in the posterior left upper lobe measures 2.1 x 2.6 cm (7/63), stable. Surrounding architectural distortion. Otherwise, no suspicious pulmonary nodules. No pleural fluid. Minimal debris in the airway. Musculoskeletal: Degenerative changes in the spine. No worrisome lytic or sclerotic lesions. CT ABDOMEN PELVIS FINDINGS Hepatobiliary: Liver is slightly decreased in attenuation diffusely. Subcentimeter low-attenuation lesions, too small to characterize. No specific follow-up necessary. Gallbladder unremarkable. Low-attenuation lesion in the porta hepatis measures 2.3 x 3.2 cm, stable and possibly a choledochal cyst. No biliary ductal dilatation. Pancreas: Negative. Spleen: Negative. Adrenals/Urinary Tract: Adrenal glands are unremarkable. Low-attenuation lesions in the kidneys measure up to 2.6 cm on the left and are likely cysts. No specific follow-up necessary. 1.5 cm mildly hyperdense lesion off the upper pole left kidney measures 15 Hounsfield units. On PET, lesion measures 32  Hounsfield units. A hyperdense cyst is likely. No specific follow-up necessary. Ureters are decompressed. There may be slight anterolateral bladder wall thickening, similar. Stomach/Bowel: Tiny hiatal hernia. Posterior gastric diverticulum. Stomach, small bowel, appendix and colon are unremarkable. Vascular/Lymphatic: Atherosclerotic calcification of the aorta. No pathologically enlarged lymph nodes. Reproductive: Uterus is visualized.  No adnexal mass. Other: No free fluid.  Mesenteries and peritoneum are unremarkable. Musculoskeletal: Degenerative changes in the spine and hips. Difficult to definitively exclude mild changes of avascular necrosis in the femoral heads bilaterally. IMPRESSION: 1. Spiculated consolidation  in the left upper lobe, stable and compatible with treated bronchogenic carcinoma. No evidence of metastatic disease. 2. Hepatic steatosis. 3. Possible choledochal cyst, stable. 4.  Aortic atherosclerosis (ICD10-I70.0). 5.  Emphysema (ICD10-J43.9). Electronically Signed   By: Lorin Picket M.D.   On: 06/27/2022 08:23    ASSESSMENT AND PLAN: This is a very pleasant 71 years old white female diagnosed with a stage IV (T2 a, N2, M1 B) non-small cell lung cancer, adenocarcinoma presented with left upper lobe lung mass in addition to left hilar and precarinal metastatic adenopathy as well as small left lower lobe pulmonary nodule and solitary left frontal brain metastasis diagnosed in July 2022.  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 The patient is status post SRS to the solitary brain metastasis. She underwent a course of concurrent chemoradiation to the locally advanced disease in the chest with carboplatin for AUC of 2 and paclitaxel 45 Mg/M2 status post 7 cycles.  The patient has been tolerating her treatment well with no concerning adverse effects. Her scan showed mild improvement of her disease with no concerning findings for progression. Technically the patient has a stage IV lung cancer with brain metastasis  The patient is currently undergoing systemic chemotherapy with carboplatin for AUC of 5, Alimta 500 Mg/M2 and Keytruda 200 Mg IV every 3 weeks status post 14 cycles.  Starting from cycle #5 the patient will be on maintenance treatment with Alimta and Keytruda every 3 weeks.   The patient has been tolerating this treatment well with no concerning adverse effects. I recommended for her to proceed with cycle #15 today as planned. She will come back for follow-up visit in 3 weeks for evaluation before starting cycle #16.  I will consider repeating her imaging studies after the next cycle of her treatment. The patient was advised to call immediately  if she has any other concerning symptoms in the interval. The patient voices understanding of current disease status and treatment options and is in agreement with the current care plan. The total time spent in the appointment was 20 minutes.  All questions were answered. The patient knows to call the clinic with any problems, questions or concerns. We can certainly see the patient much sooner if necessary.  Disclaimer: This note was dictated with voice recognition software. Similar sounding words can inadvertently be transcribed and may not be corrected upon review.

## 2022-07-21 LAB — T4: T4, Total: 9.5 ug/dL (ref 4.5–12.0)

## 2022-08-01 ENCOUNTER — Other Ambulatory Visit: Payer: Self-pay | Admitting: Family Medicine

## 2022-08-06 NOTE — Progress Notes (Unsigned)
[facility] OFFICE PROGRESS NOTE  [name], MD [place] [address]  DIAGNOSIS: Stage IV (T2 a, N2, M1b) non-small cell lung cancer, adenocarcinoma presented with left upper lobe lung mass in addition to left hilar and precarinal metastatic adenopathy and small left lower lobe pulmonary nodule in addition to solitary left frontal brain metastasis diagnosed in July 2022.   Molecular studies by [facility] showed  Positive KRAS G12C mutation PD-L1 expression was less than 1%  PRIOR THERAPY: 1) Status post SRS to the solitary brain metastasis under the care of Dr. Tammi Klippel. Completed on 06/23/21 2)  Treatment for the locally advanced disease in the chest with carboplatin for AUC of 2 and paclitaxel 45 Mg/M2.  Status post 6 cycles.  Last dose was given 07/25/2021. 3) SRS to the additional metastatic brain lesions on 09/28/22  CURRENT THERAPY: Systemic chemotherapy with carboplatin for AUC of 5, Alimta 500 Mg/M2 and Keytruda 200 Mg IV every 3 weeks.  First dose August 31, 2021. Status post 15 cycles. Starting from cycle #5, she will start Alimta and keytruda maintenance IV every 3 weeks.   INTERVAL HISTORY: Jill [name] 71 y.o. female returns to the clinic today for a follow-up visit.  The patient is feeling fairly well today without any concerning complaints.  The patient is currently undergoing maintenance chemotherapy and immunotherapy.  She is tolerating it fairly well besides fatigue.  She is wondering if she can get her flu shots and COVID booster here. She denies any fever or chills. Denies any recent night sweats. She follows closely with radiation oncology for her history of metastatic disease to the brain. She recently had a brain MRI and a follow up with radiation oncology. She reports her baseline dyspnea on exertion and chronic cough, which is stable. She still smokes 1 ppd of cigarettes.  She denies any chest pain or hemoptysis.  Denies  any nausea, vomiting, or diarrhea. She may intermittently have mild constipation which she manages with OTC medications. She has a single dry itchy lesion on the dorsal aspect of her left hand. She is here for evaluation and repeat blood work before starting cycle #16.     MEDICAL HISTORY: Past Medical History:  Diagnosis Date   Anxiety    ON PAXIL, XANAX   AVM (arteriovenous malformation) brain    s/p stent/coil   Blood transfusion    x 2   Cancer (HCC)    Left lung   COPD (chronic obstructive pulmonary disease) (HCC)    no inhaler, no oxygen   Coronary artery disease    Prior inferior MI with stent to RCA, s/p CABG in 2008   Depression    Dizziness    Dyspnea    with exertion, no oxygen   Fatigue    Foot injury 06/01/2017   right - RESOLVED, no longer an issue per patient 05/18/21   GERD (gastroesophageal reflux disease)    Headache(784.0)    UNRUPTURED CEREBRAL ANEURYSM   Hyperlipidemia    Hypertension    Hypothyroidism    Infected cyst of skin 09/18/2013   Left knee pain 06/05/2012   Lung mass    Left lung   Myocardial infarction ([facility])    Normal nuclear stress test Ju;y 2012   No ischemia. EF 70%; fixed defect involving septum, inferoseptal and inferior wall   Pain, dental 02/06/2017   Poor dental hygiene    Retroperitoneal bleeding    Following cardiac cath  Tobacco abuse    Urine discoloration 09/18/2013   Urine incontinence 07/06/2020   UTI (lower urinary tract infection) 10/16/2013    ALLERGIES:  is allergic to varenicline, ace inhibitors, prednisone, betalin 12 [vitamin b12], penicillins, and sulfa drugs cross reactors.  MEDICATIONS:  Current Outpatient Medications  Medication Sig Dispense Refill   acetaminophen (TYLENOL) 500 MG tablet Take 500 mg by mouth every 6 (six) hours as needed.     amLODipine (NORVASC) 10 MG tablet Take 1 tablet by mouth once daily 90 tablet 1   aspirin EC 81 MG tablet Take 1 tablet (81 mg total) by mouth daily. 90 tablet 2    atorvastatin (LIPITOR) 40 MG tablet Take 1 tablet (40 mg total) by mouth daily. 90 tablet 1   Cholecalciferol (VITAMIN D3) 250 MCG (10000 UT) capsule Take 10,000 mcg by mouth daily.     diphenhydrAMINE HCl (BENADRYL ALLERGY PO) Take 25 mg by mouth at bedtime as needed (for sleep).     escitalopram (LEXAPRO) 10 MG tablet Take 1 tablet by mouth once daily 90 tablet 1   folic acid (FOLVITE) 1 MG tablet Take 1 tablet by mouth once daily 30 tablet 0   levothyroxine (SYNTHROID) 137 MCG tablet TAKE 1 TABLET BY MOUTH ONCE DAILY BEFORE BREAKFAST 30 tablet 2   losartan (COZAAR) 100 MG tablet Take 1 tablet by mouth once daily 90 tablet 1   metoprolol succinate (TOPROL-XL) 50 MG 24 hr tablet TAKE 1 TABLET BY MOUTH ONCE DAILY WITH A MEAL OR  IMMEDIATELY  FOLLOWING 90 tablet 1   Multiple Vitamins-Minerals (MULTIVITAMIN WITH MINERALS) tablet Take 1 tablet by mouth daily.     nitroGLYCERIN (NITROSTAT) 0.4 MG SL tablet Place 1 tablet (0.4 mg total) under the tongue every 5 (five) minutes as needed. For chest pain (Patient not taking: Reported on 10/26/2021) 25 tablet 6   Vitamin A 2400 MCG (8000 UT) TABS Take 2,400 mg by mouth daily.     No current facility-administered medications for this visit.    SURGICAL HISTORY:  Past Surgical History:  Procedure Laterality Date   CARDIAC CATHETERIZATION  01/02/2007   IT REVEALS MILD INFERIOR WALL HYPOKINESIS. THE EJECTION FRACTION IS AROUND 50%   COLONOSCOPY     CORONARY ARTERY BYPASS GRAFT  11/06/2006   LIMA to LAD, SVG to DX, SVG to LCX & SVG to OM 1 & 2, and SVG to PD   CORONARY STENT PLACEMENT     Remote past stent to RCA   EYE SURGERY Right    cataracts removed   HEMORRHOID SURGERY  11/07/1987   UPPER GI ENDOSCOPY     VENTRICULOSTOMY  10/06/2011   Procedure: VENTRICULOSTOMY;  Surgeon: Winfield Cunas;  Location: [facility];  Service: Neurosurgery;  Laterality: Right;  Insertion of Ventriculostomy Catheter   VIDEO BRONCHOSCOPY WITH ENDOBRONCHIAL NAVIGATION  Left 05/20/2021   Procedure: VIDEO BRONCHOSCOPY WITH ENDOBRONCHIAL NAVIGATION;  Surgeon: Garner Nash, DO;  Location: [facility];  Service: Pulmonary;  Laterality: Left;   VIDEO BRONCHOSCOPY WITH ENDOBRONCHIAL ULTRASOUND Bilateral 05/20/2021   Procedure: VIDEO BRONCHOSCOPY WITH ENDOBRONCHIAL ULTRASOUND;  Surgeon: Garner Nash, DO;  Location: [facility];  Service: Pulmonary;  Laterality: Bilateral;   WISDOM TOOTH EXTRACTION      REVIEW OF SYSTEMS:   Constitutional: Positive for fatigue. Negative for chills and fever. HENT: Negative for mouth sores, nosebleeds, sore throat and trouble swallowing.   Eyes: Negative for eye problems and icterus.  Respiratory: Positive for productive cough (clear phlegm).  Positive  for shortness of breath with exertion.  Negative for hemoptysis and wheezing.   Cardiovascular: Negative for chest pain and leg swelling.  Gastrointestinal: Negative for abdominal pain, diarrhea, constipation, nausea and vomiting.  Genitourinary: Negative for bladder incontinence, difficulty urinating, dysuria, frequency and hematuria.   Musculoskeletal: Negative for back pain, gait problem, neck pain and neck stiffness.  Skin:Positive for itchy dry lesion on dorsal aspect of left hand.  Neurological: Negative for dizziness, extremity weakness, gait problem, light-headedness and seizures.  Hematological: Negative for adenopathy. Does not bruise/bleed easily.  Psychiatric/Behavioral: Negative for confusion, depression and sleep disturbance. The patient is not nervous/anxious.    PHYSICAL EXAMINATION:  Blood pressure (!) 126/57, pulse 72, temperature 97.8 F (36.6 C), temperature source Oral, resp. rate 14, height _0  (1.702 m), weight 142 lb 1.6 oz (64.5 kg), SpO2 94 %.  ECOG PERFORMANCE STATUS: 1  Physical Exam  Constitutional: Oriented to person, place, and time and well-developed, well-nourished, and in no distress.   HENT:  Head: Normocephalic and atraumatic.  Mouth/Throat:  Oropharynx is clear and moist. No oropharyngeal exudate. Poor dentition with multiple missing teeth and dental caries. Has a small lesion on her right upper gum line.  Eyes: Conjunctivae are normal. Right eye exhibits no discharge. Left eye exhibits no discharge. No scleral icterus.  Neck: Normal range of motion. Neck supple.  Cardiovascular: Normal rate, regular rhythm, normal heart sounds and intact distal pulses.   Pulmonary/Chest: Effort normal.  Decreased breath sounds in all lung fields.  No respiratory distress. No wheezes. No rales.  Abdominal: Soft. Bowel sounds are normal. Exhibits no distension and no mass. There is no tenderness.  Musculoskeletal: Normal range of motion. Exhibits no edema.  Lymphadenopathy:    No cervical adenopathy.  Neurological: Alert and oriented to person, place, and time. Exhibits muscle wasting. tone. Gait normal. Coordination normal.  Skin: Skin is warm and dry. Dry skin lesion on dorsal aspect of left hand. Not diaphoretic. No erythema. No pallor.  Psychiatric: Mood, memory and judgment normal.  Vitals reviewed.  LABORATORY DATA: Lab Results  Component Value Date   WBC 4.7 08/09/2022   HGB 11.7 (L) 08/09/2022   HCT 35.4 (L) 08/09/2022   MCV 105.7 (H) 08/09/2022   PLT 335 08/09/2022      Chemistry      Component Value Date/Time   NA 142 08/09/2022 0936   NA 141 04/12/2021 1356   K 3.9 08/09/2022 0936   CL 105 08/09/2022 0936   CO2 33 (H) 08/09/2022 0936   BUN 12 08/09/2022 0936   BUN 13 04/12/2021 1356   CREATININE 1.09 (H) 08/09/2022 0936   CREATININE 0.99 08/29/2016 1130      Component Value Date/Time   CALCIUM 9.2 08/09/2022 0936   ALKPHOS 77 08/09/2022 0936   AST 25 08/09/2022 0936   ALT 14 08/09/2022 0936   BILITOT 0.2 (L) 08/09/2022 0936       RADIOGRAPHIC STUDIES:  MR Brain W Wo Contrast  Result Date: 07/13/2022 CLINICAL DATA:  Brain metastases, assess treatment response 3T SRS Protocol. Lung carcinoma. Status post  radiation treatment EXAM: MRI HEAD WITHOUT AND WITH CONTRAST TECHNIQUE: Multiplanar, multiecho pulse sequences of the brain and surrounding structures were obtained without and with intravenous contrast. CONTRAST:  9m MULTIHANCE GADOBENATE DIMEGLUMINE 529 MG/ML IV SOLN COMPARISON:  04/06/2022 FINDINGS: Brain: Unchanged contrast-enhancing lesion of the paramedian left frontal lobe, 4 mm (13:132). No new contrast-enhancing lesions. No contrast enhancement at the left temporal and occipital treatment sites. There is  no acute infarct or acute hemorrhage. There is multifocal periventricular white matter hyperintensity, most often a result of chronic microvascular ischemia. 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 There are unchanged chronic microhemorrhages in the left frontal lobe. Mild generalized volume loss. Vascular: Major flow voids are maintained. 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 Skull and upper cervical spine: Old right frontal burr hole Sinuses/Orbits: Right ocular lens replacement. Paranasal sinuses are clear. Other: None IMPRESSION: 1. Unchanged 4 mm contrast-enhancing lesion of the paramedian left frontal lobe. No new contrast-enhancing lesions. 2. Status post coiled basilar tip aneurysm. Electronically Signed   By: Ulyses Jarred M.D.   On: 07/13/2022 13:45     ASSESSMENT/PLAN:  This is a very pleasant 71 year old Caucasian female diagnosed with stage IV (T2 a, N2, M1 B) non-small cell lung cancer, adenocarcinoma.  She presented with a left upper lobe lung mass in addition to left hilar and precarinal metastatic adenopathy as well as a small left lower lobe pulmonary nodule and a solitary left frontal brain metastasis.  This was diagnosed in July 2022.  Her molecular studies show that she is positive for K-ras G12C mutation and her PD-L1 expression is negative.  She completed SRS to the solitary brain metastasis under the care  of Dr. Tammi Klippel which was completed on 06/23/2021.  She had a repeat brain MRI performed on 09/15/2021 which shows a new 1 mm and 3 mm metastatic brain lesions.  She received SRS on 09/28/2021.  She completed concurrent chemoradiation for the locally advanced disease in the chest with carboplatin for an AUC of 2 and paclitaxel 45 mg per metered squared.  She is status post 6 cycles.  She had been tolerating treatment well without any concerning adverse side effects.    The patient is currently undergoing systemic chemotherapy with carboplatin AUC 5, Alimta 500 mg per metered squared, Keytruda 20 mg IV every 3 weeks.  She is status post 15 cycles.  She started maintenance alimta and Keytruda starting from cycle #5.   Labs were reviewed.  Recommend that she proceed with cycle #16 today scheduled.  We will see her back for follow-up visit in 3 weeks for evaluation and repeat blood work before starting cycle #17.   I will arrange for a restaging CT scan of the chest, abdomen, and pelvis prior to her next cycle of treatment.   For the skin lesion, this has been there about 1 week. Encouraged to use hydrocortisone cream for the itching.   For the fatigue, I encouraged the patient to increase her activity to improve her exercise tolerance.   I reviewed with the patient we did not have the COVID-19 boosters at the clinic. However, they are available at the outpatient pharmcy. We should be getting the flu vaccine in stock. If available today, she may get it today or at her next appointment.   The patient was advised to call immediately if she has any concerning symptoms in the interval. The patient voices understanding of current disease status and treatment options and is in agreement with the current care plan. All questions were answered. The patient knows to call the clinic with any problems, questions or concerns. We can certainly see the patient much sooner if necessary       Orders Placed This  Encounter  Procedures   CT Chest W Contrast    Standing Status:   Future    Standing Expiration Date:  08/09/2023    Order Specific Question:   If indicated for the ordered procedure, I authorize the administration of contrast media per Radiology protocol    Answer:   Yes    Order Specific Question:   Preferred imaging location?    Answer:   [facility]   CT Abdomen Pelvis W Contrast    Standing Status:   Future    Standing Expiration Date:   08/09/2023    Order Specific Question:   If indicated for the ordered procedure, I authorize the administration of contrast media per Radiology protocol    Answer:   Yes    Order Specific Question:   Preferred imaging location?    Answer:   [facility]    Order Specific Question:   Is Oral Contrast requested for this exam?    Answer:   Yes, Per Radiology protocol     The total time spent in the appointment was 20-29 minutes.   Tylik Treese L Delorean Knutzen, PA-C 08/09/22

## 2022-08-09 ENCOUNTER — Inpatient Hospital Stay: Payer: Medicare HMO

## 2022-08-09 ENCOUNTER — Inpatient Hospital Stay: Payer: Medicare HMO | Attending: Internal Medicine | Admitting: Physician Assistant

## 2022-08-09 ENCOUNTER — Other Ambulatory Visit: Payer: Self-pay

## 2022-08-09 VITALS — BP 143/68 | HR 67 | Temp 98.0°F | Resp 18

## 2022-08-09 VITALS — BP 126/57 | HR 72 | Temp 97.8°F | Resp 14 | Ht 67.0 in | Wt 142.1 lb

## 2022-08-09 DIAGNOSIS — R059 Cough, unspecified: Secondary | ICD-10-CM | POA: Insufficient documentation

## 2022-08-09 DIAGNOSIS — R197 Diarrhea, unspecified: Secondary | ICD-10-CM | POA: Insufficient documentation

## 2022-08-09 DIAGNOSIS — Z5111 Encounter for antineoplastic chemotherapy: Secondary | ICD-10-CM | POA: Insufficient documentation

## 2022-08-09 DIAGNOSIS — Z5112 Encounter for antineoplastic immunotherapy: Secondary | ICD-10-CM | POA: Insufficient documentation

## 2022-08-09 DIAGNOSIS — N281 Cyst of kidney, acquired: Secondary | ICD-10-CM | POA: Diagnosis not present

## 2022-08-09 DIAGNOSIS — J634 Siderosis: Secondary | ICD-10-CM | POA: Diagnosis not present

## 2022-08-09 DIAGNOSIS — C7931 Secondary malignant neoplasm of brain: Secondary | ICD-10-CM | POA: Diagnosis not present

## 2022-08-09 DIAGNOSIS — I700 Atherosclerosis of aorta: Secondary | ICD-10-CM | POA: Diagnosis not present

## 2022-08-09 DIAGNOSIS — M47814 Spondylosis without myelopathy or radiculopathy, thoracic region: Secondary | ICD-10-CM | POA: Insufficient documentation

## 2022-08-09 DIAGNOSIS — M47816 Spondylosis without myelopathy or radiculopathy, lumbar region: Secondary | ICD-10-CM | POA: Insufficient documentation

## 2022-08-09 DIAGNOSIS — L989 Disorder of the skin and subcutaneous tissue, unspecified: Secondary | ICD-10-CM | POA: Insufficient documentation

## 2022-08-09 DIAGNOSIS — Z888 Allergy status to other drugs, medicaments and biological substances status: Secondary | ICD-10-CM | POA: Insufficient documentation

## 2022-08-09 DIAGNOSIS — E785 Hyperlipidemia, unspecified: Secondary | ICD-10-CM | POA: Diagnosis not present

## 2022-08-09 DIAGNOSIS — Z8719 Personal history of other diseases of the digestive system: Secondary | ICD-10-CM | POA: Insufficient documentation

## 2022-08-09 DIAGNOSIS — C3412 Malignant neoplasm of upper lobe, left bronchus or lung: Secondary | ICD-10-CM

## 2022-08-09 DIAGNOSIS — K449 Diaphragmatic hernia without obstruction or gangrene: Secondary | ICD-10-CM | POA: Insufficient documentation

## 2022-08-09 DIAGNOSIS — F17210 Nicotine dependence, cigarettes, uncomplicated: Secondary | ICD-10-CM | POA: Insufficient documentation

## 2022-08-09 DIAGNOSIS — K769 Liver disease, unspecified: Secondary | ICD-10-CM | POA: Insufficient documentation

## 2022-08-09 DIAGNOSIS — E039 Hypothyroidism, unspecified: Secondary | ICD-10-CM | POA: Insufficient documentation

## 2022-08-09 DIAGNOSIS — I252 Old myocardial infarction: Secondary | ICD-10-CM | POA: Insufficient documentation

## 2022-08-09 DIAGNOSIS — Z79899 Other long term (current) drug therapy: Secondary | ICD-10-CM | POA: Diagnosis not present

## 2022-08-09 DIAGNOSIS — C779 Secondary and unspecified malignant neoplasm of lymph node, unspecified: Secondary | ICD-10-CM | POA: Insufficient documentation

## 2022-08-09 DIAGNOSIS — K5900 Constipation, unspecified: Secondary | ICD-10-CM | POA: Insufficient documentation

## 2022-08-09 DIAGNOSIS — Z79890 Hormone replacement therapy (postmenopausal): Secondary | ICD-10-CM | POA: Diagnosis not present

## 2022-08-09 DIAGNOSIS — Z923 Personal history of irradiation: Secondary | ICD-10-CM | POA: Insufficient documentation

## 2022-08-09 DIAGNOSIS — Z880 Allergy status to penicillin: Secondary | ICD-10-CM | POA: Insufficient documentation

## 2022-08-09 DIAGNOSIS — R5383 Other fatigue: Secondary | ICD-10-CM | POA: Diagnosis not present

## 2022-08-09 DIAGNOSIS — Z882 Allergy status to sulfonamides status: Secondary | ICD-10-CM | POA: Insufficient documentation

## 2022-08-09 DIAGNOSIS — Z9221 Personal history of antineoplastic chemotherapy: Secondary | ICD-10-CM | POA: Insufficient documentation

## 2022-08-09 DIAGNOSIS — Z87440 Personal history of urinary (tract) infections: Secondary | ICD-10-CM | POA: Insufficient documentation

## 2022-08-09 LAB — CBC WITH DIFFERENTIAL (CANCER CENTER ONLY)
Abs Immature Granulocytes: 0 10*3/uL (ref 0.00–0.07)
Basophils Absolute: 0.1 10*3/uL (ref 0.0–0.1)
Basophils Relative: 2 %
Eosinophils Absolute: 0.2 10*3/uL (ref 0.0–0.5)
Eosinophils Relative: 3 %
HCT: 35.4 % — ABNORMAL LOW (ref 36.0–46.0)
Hemoglobin: 11.7 g/dL — ABNORMAL LOW (ref 12.0–15.0)
Immature Granulocytes: 0 %
Lymphocytes Relative: 33 %
Lymphs Abs: 1.5 10*3/uL (ref 0.7–4.0)
MCH: 34.9 pg — ABNORMAL HIGH (ref 26.0–34.0)
MCHC: 33.1 g/dL (ref 30.0–36.0)
MCV: 105.7 fL — ABNORMAL HIGH (ref 80.0–100.0)
Monocytes Absolute: 0.5 10*3/uL (ref 0.1–1.0)
Monocytes Relative: 11 %
Neutro Abs: 2.4 10*3/uL (ref 1.7–7.7)
Neutrophils Relative %: 51 %
Platelet Count: 335 10*3/uL (ref 150–400)
RBC: 3.35 MIL/uL — ABNORMAL LOW (ref 3.87–5.11)
RDW: 14.3 % (ref 11.5–15.5)
WBC Count: 4.7 10*3/uL (ref 4.0–10.5)
nRBC: 0 % (ref 0.0–0.2)

## 2022-08-09 LAB — CMP (CANCER CENTER ONLY)
ALT: 14 U/L (ref 0–44)
AST: 25 U/L (ref 15–41)
Albumin: 3.8 g/dL (ref 3.5–5.0)
Alkaline Phosphatase: 77 U/L (ref 38–126)
Anion gap: 4 — ABNORMAL LOW (ref 5–15)
BUN: 12 mg/dL (ref 8–23)
CO2: 33 mmol/L — ABNORMAL HIGH (ref 22–32)
Calcium: 9.2 mg/dL (ref 8.9–10.3)
Chloride: 105 mmol/L (ref 98–111)
Creatinine: 1.09 mg/dL — ABNORMAL HIGH (ref 0.44–1.00)
GFR, Estimated: 54 mL/min — ABNORMAL LOW (ref >60)
Glucose, Bld: 111 mg/dL — ABNORMAL HIGH (ref 70–99)
Potassium: 3.9 mmol/L (ref 3.5–5.1)
Sodium: 142 mmol/L (ref 135–145)
Total Bilirubin: 0.2 mg/dL — ABNORMAL LOW (ref 0.3–1.2)
Total Protein: 6.6 g/dL (ref 6.5–8.1)

## 2022-08-09 MED ORDER — SODIUM CHLORIDE 0.9 % IV SOLN
500.0000 mg/m2 | Freq: Once | INTRAVENOUS | Status: AC
  Administered 2022-08-09: 900 mg via INTRAVENOUS
  Filled 2022-08-09: qty 20

## 2022-08-09 MED ORDER — SODIUM CHLORIDE 0.9 % IV SOLN: Freq: Once | INTRAVENOUS | Status: AC

## 2022-08-09 MED ORDER — PROCHLORPERAZINE MALEATE 10 MG PO TABS
10.0000 mg | ORAL_TABLET | Freq: Once | ORAL | Status: AC
  Administered 2022-08-09: 10 mg via ORAL
  Filled 2022-08-09: qty 1

## 2022-08-09 MED ORDER — SODIUM CHLORIDE 0.9 % IV SOLN
200.0000 mg | Freq: Once | INTRAVENOUS | Status: AC
  Administered 2022-08-09: 200 mg via INTRAVENOUS
  Filled 2022-08-09: qty 200

## 2022-08-09 NOTE — Patient Instructions (Signed)
[facility]  Discharge Instructions: Thank you for choosing [facility] to provide your oncology and hematology care.   If you have a lab appointment with the [facility], please go directly to the [facility] and check in at the registration area.   Wear comfortable clothing and clothing appropriate for easy access to any Portacath or PICC line.   We strive to give you quality time with your provider. You may need to reschedule your appointment if you arrive late (15 or more minutes).  Arriving late affects you and other patients whose appointments are after yours.  Also, if you miss three or more appointments without notifying the office, you may be dismissed from the clinic at the provider's discretion.      For prescription refill requests, have your pharmacy contact our office and allow 72 hours for refills to be completed.    Today you received the following chemotherapy and/or immunotherapy agents: Keytruda and Alimta      To help prevent nausea and vomiting after your treatment, we encourage you to take your nausea medication as directed.  BELOW ARE SYMPTOMS THAT SHOULD BE REPORTED IMMEDIATELY: *FEVER GREATER THAN 100.4 F (38 C) OR HIGHER *CHILLS OR SWEATING *NAUSEA AND VOMITING THAT IS NOT CONTROLLED WITH YOUR NAUSEA MEDICATION *UNUSUAL SHORTNESS OF BREATH *UNUSUAL BRUISING OR BLEEDING *URINARY PROBLEMS (pain or burning when urinating, or frequent urination) *BOWEL PROBLEMS (unusual diarrhea, constipation, pain near the anus) TENDERNESS IN MOUTH AND THROAT WITH OR WITHOUT PRESENCE OF ULCERS (sore throat, sores in mouth, or a toothache) UNUSUAL RASH, SWELLING OR PAIN  UNUSUAL VAGINAL DISCHARGE OR ITCHING   Items with * indicate a potential emergency and should be followed up as soon as possible or go to the Emergency Department if any problems should occur.  Please show the CHEMOTHERAPY ALERT CARD or IMMUNOTHERAPY ALERT CARD at  check-in to the Emergency Department and triage nurse.  Should you have questions after your visit or need to cancel or reschedule your appointment, please contact [facility]  Dept: [phone]  and follow the prompts.  Office hours are 8:00 a.m. to 4:30 p.m. Monday - Friday. Please note that voicemails left after 4:00 p.m. may not be returned until the following business day.  We are closed weekends and major holidays. You have access to a nurse at all times for urgent questions. Please call the main number to the clinic Dept: [phone] and follow the prompts.   For any non-urgent questions, you may also contact your provider using MyChart. We now offer e-Visits for anyone 92 and older to request care online for non-urgent symptoms. For details visit [email or web address].   Also download the MyChart app! Go to the app store, search "MyChart", open the app, select [facility], and log in with your MyChart username and password.  Masks are optional in the cancer centers. If you would like for your care team to wear a mask while they are taking care of you, please let them know. You may have one support person who is at least 73 years old accompany you for your appointments.

## 2022-08-11 ENCOUNTER — Other Ambulatory Visit: Payer: Self-pay

## 2022-08-13 ENCOUNTER — Other Ambulatory Visit: Payer: Self-pay

## 2022-08-17 ENCOUNTER — Ambulatory Visit: Payer: Self-pay

## 2022-08-17 NOTE — Patient Outreach (Signed)
  Care Coordination   08/17/2022 Name: Jill [name] MRN: [id number] DOB: April 27, 1951   Care Coordination Outreach Attempts:  An unsuccessful telephone outreach was attempted today to offer the patient information about available care coordination services as a benefit of their health plan.   Follow Up Plan:  Additional outreach attempts will be made to offer the patient care coordination information and services.   Encounter Outcome:  No Answer  Care Coordination Interventions Activated:  No   Care Coordination Interventions:  No, not indicated   Johnney Killian, RN, BSN, CCM Care Management Coordinator [facility] Phone: [phone]: [phone]

## 2022-08-18 ENCOUNTER — Other Ambulatory Visit: Payer: Self-pay

## 2022-08-21 IMAGING — MR MR HEAD WO/W CM
12 series · 48 of 48 positions shown · IV contrast (multihance)
Comparison: Brain MRI 06/03/2021.

CLINICAL DATA: Radiation treatment planning. History of lung cancer
with brain metastases.

EXAM:
MRI HEAD WITHOUT AND WITH CONTRAST
TECHNIQUE: Multiplanar, multiecho pulse sequences of the brain and surrounding
structures were obtained without and with intravenous contrast.
CONTRAST:  12mL MULTIHANCE GADOBENATE DIMEGLUMINE 529 MG/ML IV SOLN

[Series 3: FLAIR · sagittal · 3.0mm · 0.75mm/px · 3 of 39 slices shown (1 of 2)]
[im 1/39]
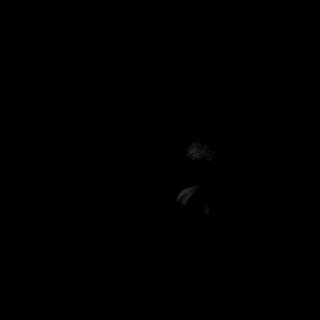
[im 20/39]
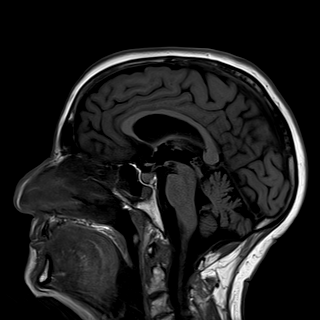
[im 39/39]
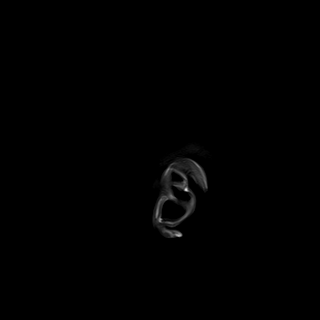

[Series 4: DWI · axial · 3.0mm · 1.50mm/px · z∈[-49,+111]mm · 4 of 84 slices shown (1 of 2)]
[im 1/84]
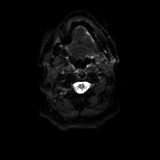
[im 28/84]
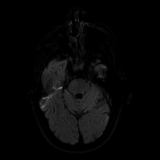
[im 56/84]
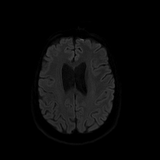
[im 84/84]
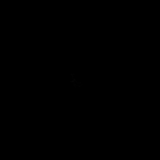

[Series 5: DWI · axial · 3.0mm · 1.50mm/px · z∈[-49,+111]mm · 2 of 41 slices shown (2 of 2)]
[im 1/41]
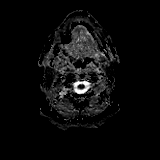
[im 41/41]
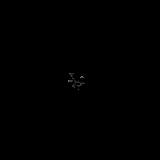

[Series 6: T2 · axial · 5.0mm · 0.57mm/px · 1 of 27 slices shown (1 of 2)]
[im 1/27]
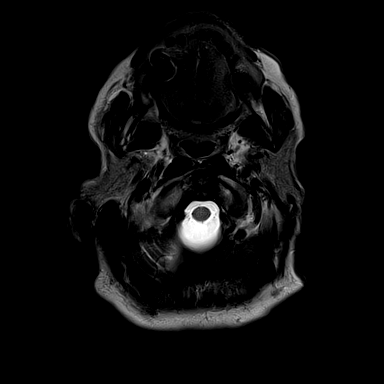

[Series 7: swi_images · axial · 1.5mm · 0.90mm/px · z∈[-45,+121]mm · 5 of 112 slices shown]
[im 1/112]
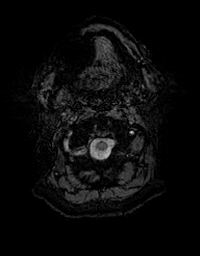
[im 28/112]
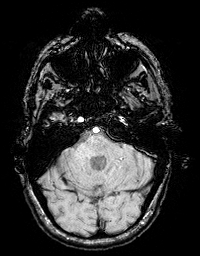
[im 56/112]
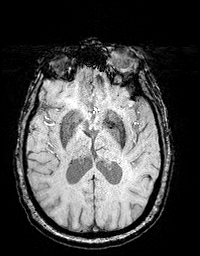
[im 84/112]
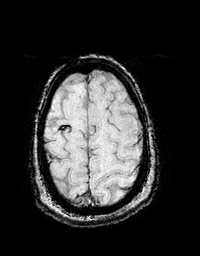
[im 112/112]
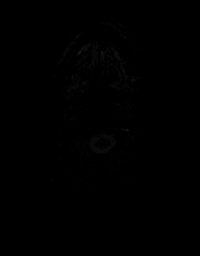

[Series 9: FLAIR · axial · 3.0mm · 0.86mm/px · z∈[-63,+129]mm · 3 of 61 slices shown (2 of 2)]
[im 1/61]
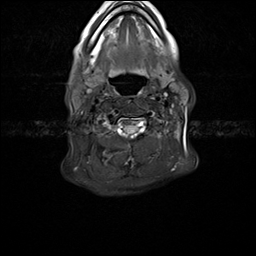
[im 31/61]
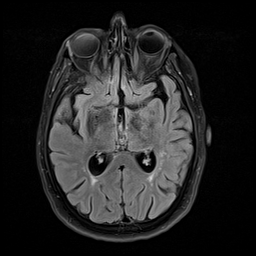
[im 61/61]
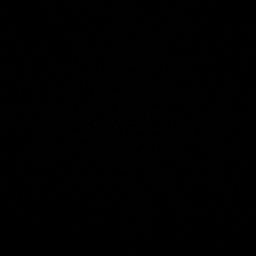

[Series 10: T2 · axial · non-contrast · 1.0mm · 0.86mm/px · z∈[-52,+120]mm · 8 of 176 slices shown (2 of 2)]
[im 1/176]
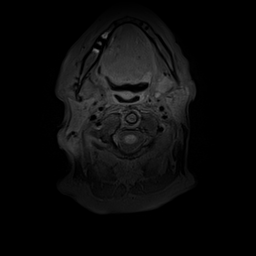
[im 26/176]
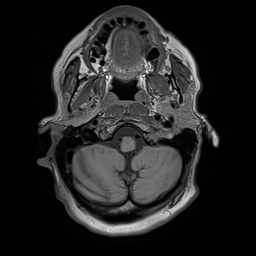
[im 51/176]
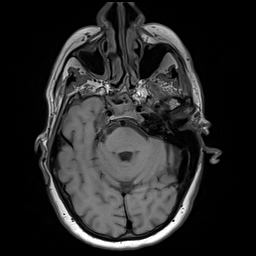
[im 76/176]
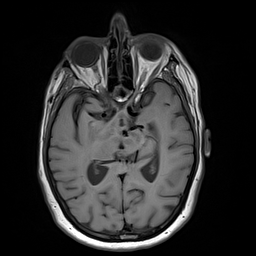
[im 101/176]
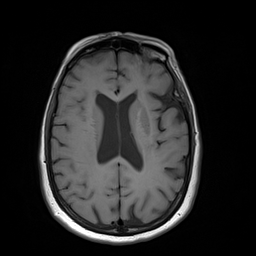
[im 126/176]
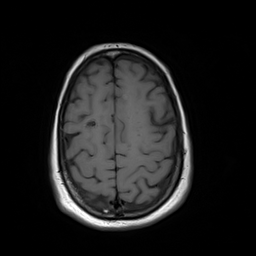
[im 151/176]
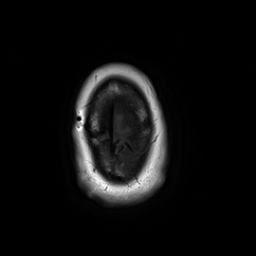
[im 176/176]
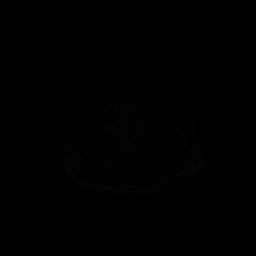

[Series 11: T2 post-contrast · coronal · 3.0mm · 0.57mm/px · 2 of 47 slices shown (1 of 2)]
[im 1/47]
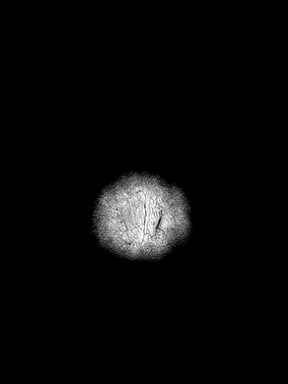
[im 47/47]
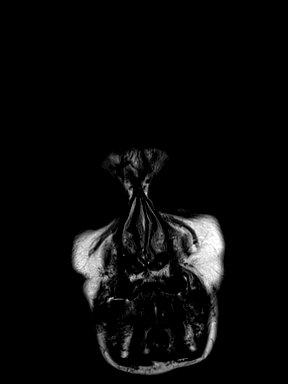

[Series 12: T2 post-contrast · axial · 1.0mm · 0.86mm/px · z∈[-52,+120]mm · 8 of 176 slices shown (2 of 2)]
[im 1/176]
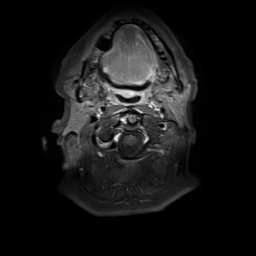
[im 26/176]
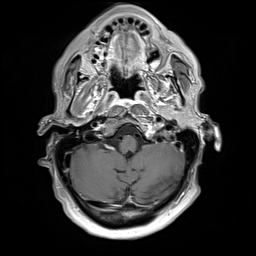
[im 51/176]
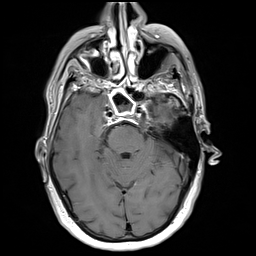
[im 76/176]
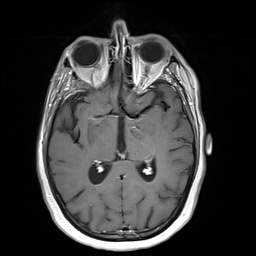
[im 101/176]
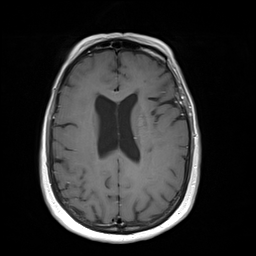
[im 126/176]
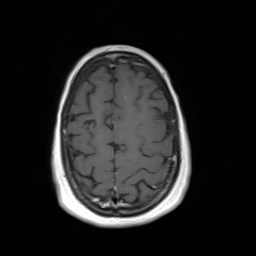
[im 151/176]
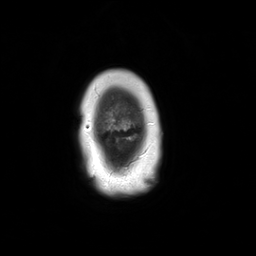
[im 176/176]
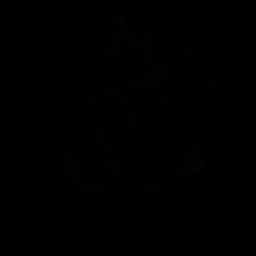

[Series 13: T1 post-contrast · axial · 1.0mm · 0.75mm/px · z∈[-45,+113]mm · 8 of 158 slices shown (1 of 2)]
[im 1/158]
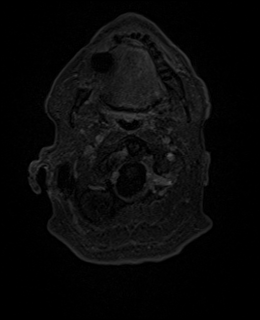
[im 23/158]
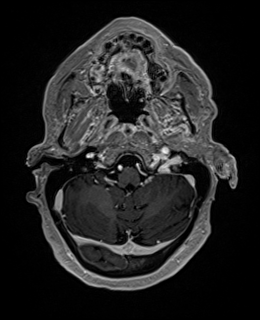
[im 45/158]
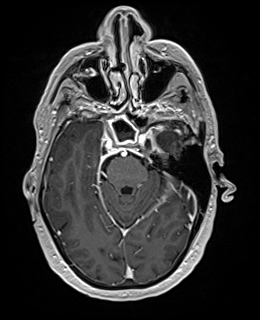
[im 68/158]
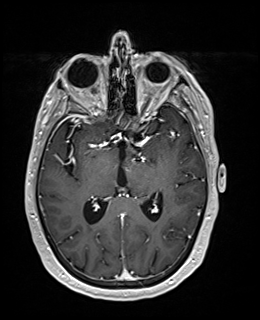
[im 90/158]
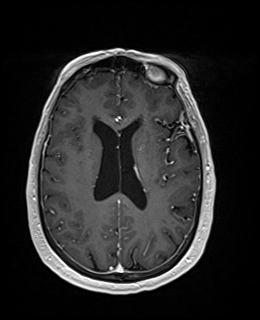
[im 113/158]
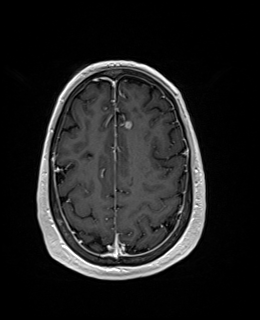
[im 135/158]
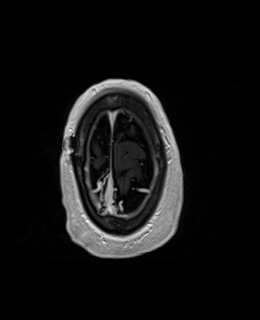
[im 158/158]
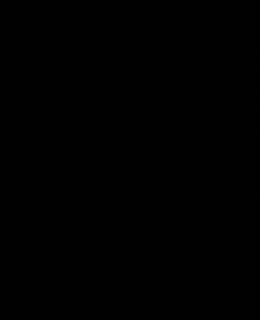

[Series 14: T1 post-contrast · coronal · 3.0mm · 0.57mm/px · 2 of 47 slices shown (2 of 2)]
[im 1/47]
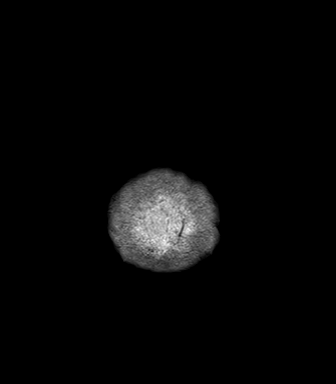
[im 47/47]
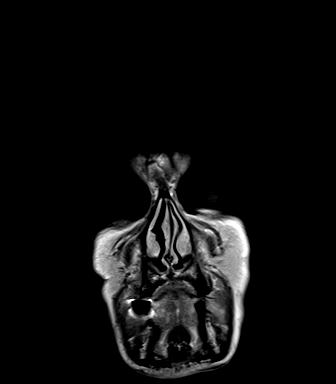

[Series 15: FLAIR post-contrast · sagittal · 3.0mm · 0.75mm/px · 2 of 39 slices shown]
[im 1/39]
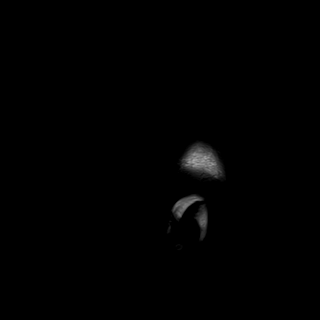
[im 39/39]
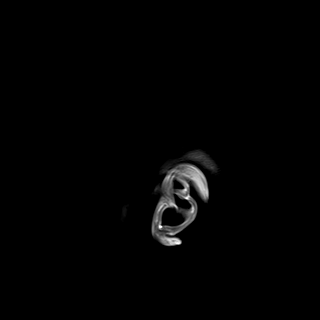

[48 of 48 positions shown; findings below may reference images not displayed]

FINDINGS: Brain: Again seen is the 7 mm enhancing lesion in the left superior
frontal gyrus near the vertex, similar in size but has slightly
increased in conspicuity since the prior study, possibly due to
difference in technique. Mild FLAIR signal surrounding this lesion
is unchanged. No new lesions are seen.

Scattered other foci of FLAIR signal abnormality throughout the
remainder of the subcortical and periventricular white matter are
unchanged, again nonspecific but likely reflecting sequela of
chronic white matter microangiopathy. There is no evidence of acute
intracranial hemorrhage, extra-axial fluid collection, or infarct. A
tract from a prior right frontal ventricular catheter with
associated gliosis and chronic blood products is unchanged. The
ventricles are stable in size. There is no midline shift.

Smooth diffuse pachymeningeal thickening and enhancement is
unchanged, nonspecific.

Vascular: Postprocedural changes reflecting treatment of a prior
basilar aneurysm are again seen. The major flow voids are present.

Skull and upper cervical spine: Marrow signal is normal.

Sinuses/Orbits: A right lens implant is noted. The left globe and
orbit are unremarkable. There is mild mucosal thickening in the
right maxillary and sphenoid sinuses with layering fluid in the
right maxillary sinus, unchanged.

Other: A nonenhancing diffusion restricting lesion in the left
frontal scalp may reflect an epidermal inclusion cyst.
IMPRESSION: No significant interval change in size of the enhancing metastatic
lesion in the left superior frontal gyrus. The increase in
conspicuity is favored due to difference in technique.

No new lesions.

## 2022-08-23 ENCOUNTER — Telehealth: Payer: Self-pay | Admitting: Internal Medicine

## 2022-08-23 NOTE — Telephone Encounter (Signed)
Called patient regarding October, November and December appointments. Left a voicemail.

## 2022-08-26 ENCOUNTER — Ambulatory Visit (HOSPITAL_COMMUNITY)
Admission: RE | Admit: 2022-08-26 | Discharge: 2022-08-26 | Disposition: A | Discharge status: Home / Self Care | Payer: Medicare HMO | Source: Ambulatory Visit | Attending: Physician Assistant | Admitting: Physician Assistant

## 2022-08-26 DIAGNOSIS — C3412 Malignant neoplasm of upper lobe, left bronchus or lung: Secondary | ICD-10-CM | POA: Insufficient documentation

## 2022-08-26 DIAGNOSIS — N289 Disorder of kidney and ureter, unspecified: Secondary | ICD-10-CM | POA: Diagnosis not present

## 2022-08-26 DIAGNOSIS — R911 Solitary pulmonary nodule: Secondary | ICD-10-CM | POA: Diagnosis not present

## 2022-08-26 DIAGNOSIS — J439 Emphysema, unspecified: Secondary | ICD-10-CM | POA: Diagnosis not present

## 2022-08-26 MED ORDER — IOHEXOL 300 MG/ML  SOLN
100.0000 mL | Freq: Once | INTRAMUSCULAR | Status: AC | PRN
  Administered 2022-08-26: 100 mL via INTRAVENOUS

## 2022-08-28 ENCOUNTER — Other Ambulatory Visit: Payer: Self-pay | Admitting: Family Medicine

## 2022-08-28 DIAGNOSIS — E785 Hyperlipidemia, unspecified: Secondary | ICD-10-CM

## 2022-08-29 ENCOUNTER — Other Ambulatory Visit: Payer: Self-pay | Admitting: Internal Medicine

## 2022-08-30 ENCOUNTER — Inpatient Hospital Stay: Payer: Medicare HMO

## 2022-08-30 ENCOUNTER — Inpatient Hospital Stay (HOSPITAL_BASED_OUTPATIENT_CLINIC_OR_DEPARTMENT_OTHER): Payer: Medicare HMO | Admitting: Internal Medicine

## 2022-08-30 ENCOUNTER — Encounter: Payer: Self-pay | Admitting: Internal Medicine

## 2022-08-30 ENCOUNTER — Other Ambulatory Visit: Payer: Self-pay

## 2022-08-30 VITALS — BP 128/64 | HR 61 | Resp 18

## 2022-08-30 DIAGNOSIS — K449 Diaphragmatic hernia without obstruction or gangrene: Secondary | ICD-10-CM | POA: Diagnosis not present

## 2022-08-30 DIAGNOSIS — R5383 Other fatigue: Secondary | ICD-10-CM | POA: Diagnosis not present

## 2022-08-30 DIAGNOSIS — M47814 Spondylosis without myelopathy or radiculopathy, thoracic region: Secondary | ICD-10-CM | POA: Diagnosis not present

## 2022-08-30 DIAGNOSIS — C7931 Secondary malignant neoplasm of brain: Secondary | ICD-10-CM | POA: Diagnosis not present

## 2022-08-30 DIAGNOSIS — Z8719 Personal history of other diseases of the digestive system: Secondary | ICD-10-CM | POA: Diagnosis not present

## 2022-08-30 DIAGNOSIS — R059 Cough, unspecified: Secondary | ICD-10-CM | POA: Diagnosis not present

## 2022-08-30 DIAGNOSIS — E785 Hyperlipidemia, unspecified: Secondary | ICD-10-CM | POA: Diagnosis not present

## 2022-08-30 DIAGNOSIS — L989 Disorder of the skin and subcutaneous tissue, unspecified: Secondary | ICD-10-CM | POA: Diagnosis not present

## 2022-08-30 DIAGNOSIS — C3412 Malignant neoplasm of upper lobe, left bronchus or lung: Secondary | ICD-10-CM

## 2022-08-30 DIAGNOSIS — J634 Siderosis: Secondary | ICD-10-CM | POA: Diagnosis not present

## 2022-08-30 DIAGNOSIS — Z79899 Other long term (current) drug therapy: Secondary | ICD-10-CM | POA: Diagnosis not present

## 2022-08-30 DIAGNOSIS — F17210 Nicotine dependence, cigarettes, uncomplicated: Secondary | ICD-10-CM | POA: Diagnosis not present

## 2022-08-30 DIAGNOSIS — Z5112 Encounter for antineoplastic immunotherapy: Secondary | ICD-10-CM | POA: Diagnosis not present

## 2022-08-30 DIAGNOSIS — E039 Hypothyroidism, unspecified: Secondary | ICD-10-CM | POA: Diagnosis not present

## 2022-08-30 DIAGNOSIS — M47816 Spondylosis without myelopathy or radiculopathy, lumbar region: Secondary | ICD-10-CM | POA: Diagnosis not present

## 2022-08-30 DIAGNOSIS — Z79890 Hormone replacement therapy (postmenopausal): Secondary | ICD-10-CM | POA: Diagnosis not present

## 2022-08-30 DIAGNOSIS — I700 Atherosclerosis of aorta: Secondary | ICD-10-CM | POA: Diagnosis not present

## 2022-08-30 DIAGNOSIS — R197 Diarrhea, unspecified: Secondary | ICD-10-CM | POA: Diagnosis not present

## 2022-08-30 DIAGNOSIS — K769 Liver disease, unspecified: Secondary | ICD-10-CM | POA: Diagnosis not present

## 2022-08-30 DIAGNOSIS — I252 Old myocardial infarction: Secondary | ICD-10-CM | POA: Diagnosis not present

## 2022-08-30 DIAGNOSIS — Z5111 Encounter for antineoplastic chemotherapy: Secondary | ICD-10-CM | POA: Diagnosis not present

## 2022-08-30 DIAGNOSIS — N281 Cyst of kidney, acquired: Secondary | ICD-10-CM | POA: Diagnosis not present

## 2022-08-30 DIAGNOSIS — C779 Secondary and unspecified malignant neoplasm of lymph node, unspecified: Secondary | ICD-10-CM | POA: Diagnosis not present

## 2022-08-30 DIAGNOSIS — K5900 Constipation, unspecified: Secondary | ICD-10-CM | POA: Diagnosis not present

## 2022-08-30 LAB — CMP (CANCER CENTER ONLY)
ALT: 14 U/L (ref 0–44)
AST: 23 U/L (ref 15–41)
Albumin: 3.7 g/dL (ref 3.5–5.0)
Alkaline Phosphatase: 78 U/L (ref 38–126)
Anion gap: 7 (ref 5–15)
BUN: 10 mg/dL (ref 8–23)
CO2: 29 mmol/L (ref 22–32)
Calcium: 9.1 mg/dL (ref 8.9–10.3)
Chloride: 104 mmol/L (ref 98–111)
Creatinine: 1.04 mg/dL — ABNORMAL HIGH (ref 0.44–1.00)
GFR, Estimated: 57 mL/min — ABNORMAL LOW (ref >60)
Glucose, Bld: 135 mg/dL — ABNORMAL HIGH (ref 70–99)
Potassium: 3.7 mmol/L (ref 3.5–5.1)
Sodium: 140 mmol/L (ref 135–145)
Total Bilirubin: 0.3 mg/dL (ref 0.3–1.2)
Total Protein: 6.9 g/dL (ref 6.5–8.1)

## 2022-08-30 LAB — CBC WITH DIFFERENTIAL (CANCER CENTER ONLY)
Abs Immature Granulocytes: 0.01 10*3/uL (ref 0.00–0.07)
Basophils Absolute: 0.1 10*3/uL (ref 0.0–0.1)
Basophils Relative: 1 %
Eosinophils Absolute: 0.2 10*3/uL (ref 0.0–0.5)
Eosinophils Relative: 3 %
HCT: 35.5 % — ABNORMAL LOW (ref 36.0–46.0)
Hemoglobin: 11.7 g/dL — ABNORMAL LOW (ref 12.0–15.0)
Immature Granulocytes: 0 %
Lymphocytes Relative: 15 %
Lymphs Abs: 0.9 10*3/uL (ref 0.7–4.0)
MCH: 34.6 pg — ABNORMAL HIGH (ref 26.0–34.0)
MCHC: 33 g/dL (ref 30.0–36.0)
MCV: 105 fL — ABNORMAL HIGH (ref 80.0–100.0)
Monocytes Absolute: 0.5 10*3/uL (ref 0.1–1.0)
Monocytes Relative: 9 %
Neutro Abs: 4.4 10*3/uL (ref 1.7–7.7)
Neutrophils Relative %: 72 %
Platelet Count: 308 10*3/uL (ref 150–400)
RBC: 3.38 MIL/uL — ABNORMAL LOW (ref 3.87–5.11)
RDW: 14.5 % (ref 11.5–15.5)
WBC Count: 6.1 10*3/uL (ref 4.0–10.5)
nRBC: 0 % (ref 0.0–0.2)

## 2022-08-30 MED ORDER — SODIUM CHLORIDE 0.9 % IV SOLN
500.0000 mg/m2 | Freq: Once | INTRAVENOUS | Status: AC
  Administered 2022-08-30: 900 mg via INTRAVENOUS
  Filled 2022-08-30: qty 20

## 2022-08-30 MED ORDER — PROCHLORPERAZINE MALEATE 10 MG PO TABS
10.0000 mg | ORAL_TABLET | Freq: Once | ORAL | Status: AC
  Administered 2022-08-30: 10 mg via ORAL
  Filled 2022-08-30: qty 1

## 2022-08-30 MED ORDER — SODIUM CHLORIDE 0.9 % IV SOLN
200.0000 mg | Freq: Once | INTRAVENOUS | Status: AC
  Administered 2022-08-30: 200 mg via INTRAVENOUS
  Filled 2022-08-30: qty 200

## 2022-08-30 MED ORDER — SODIUM CHLORIDE 0.9 % IV SOLN: Freq: Once | INTRAVENOUS | Status: AC

## 2022-08-30 NOTE — Progress Notes (Signed)
[facility] Telephone:([phone]) [phone]   [phone]  OFFICE PROGRESS NOTE  [name], MD [place] [address]  DIAGNOSIS: Stage IV (T2 a, N2, M1b) non-small cell lung cancer, adenocarcinoma presented with left upper lobe lung mass in addition to left hilar and precarinal metastatic adenopathy and small left lower lobe pulmonary nodule in addition to solitary left frontal brain metastasis diagnosed in July 2022.  Molecular studies by [facility] showed  Positive KRAS G12C mutation PD-L1 expression was less than 1%   PRIOR THERAPY:  1) Status post SRS to the solitary brain metastasis. 20 Treatment for the locally advanced disease in the chest with carboplatin for AUC of 2 and paclitaxel 45 Mg/M2.  Status post 6 cycles.  Last dose was given 07/25/2021.  CURRENT THERAPY: Systemic chemotherapy with carboplatin for AUC of 5, Alimta 500 Mg/M2 and Keytruda 200 Mg IV every 3 weeks.  First dose August 31, 2021.  Status post 16 cycles.  Starting from cycle #5 the patient will be on maintenance treatment with Alimta and Keytruda every 3 weeks.  INTERVAL HISTORY: Jill [name] 71 y.o. female returns to the clinic today for follow-up visit.  The patient is feeling fine today with no concerning complaints except for fatigue.  She denied having any current chest pain, shortness of breath, cough or hemoptysis.  She has no nausea, vomiting but has few episodes of diarrhea after the oral contrast.  She has no recent weight loss or night sweats.  She has no headache or visual changes.  She is here today with repeat CT scan of the chest, abdomen and pelvis for restaging of her disease.  MEDICAL HISTORY: Past Medical History:  Diagnosis Date   Anxiety    ON PAXIL, XANAX   AVM (arteriovenous malformation) brain    s/p stent/coil   Blood transfusion    x 2   Cancer (HCC)    Left lung   COPD (chronic obstructive pulmonary disease) (HCC)    no  inhaler, no oxygen   Coronary artery disease    Prior inferior MI with stent to RCA, s/p CABG in 2008   Depression    Dizziness    Dyspnea    with exertion, no oxygen   Fatigue    Foot injury 06/01/2017   right - RESOLVED, no longer an issue per patient 05/18/21   GERD (gastroesophageal reflux disease)    Headache(784.0)    UNRUPTURED CEREBRAL ANEURYSM   Hyperlipidemia    Hypertension    Hypothyroidism    Infected cyst of skin 09/18/2013   Left knee pain 06/05/2012   Lung mass    Left lung   Myocardial infarction ([facility])    Normal nuclear stress test Ju;y 2012   No ischemia. EF 70%; fixed defect involving septum, inferoseptal and inferior wall   Pain, dental 02/06/2017   Poor dental hygiene    Retroperitoneal bleeding    Following cardiac cath   Tobacco abuse    Urine discoloration 09/18/2013   Urine incontinence 07/06/2020   UTI (lower urinary tract infection) 10/16/2013    ALLERGIES:  is allergic to varenicline, ace inhibitors, prednisone, betalin 12 [vitamin b12], penicillins, and sulfa drugs cross reactors.  MEDICATIONS:  Current Outpatient Medications  Medication Sig Dispense Refill   acetaminophen (TYLENOL) 500 MG tablet Take 500 mg by mouth every 6 (six) hours as needed.     amLODipine (NORVASC) 10 MG tablet Take 1 tablet by mouth  once daily 90 tablet 1   aspirin EC 81 MG tablet Take 1 tablet (81 mg total) by mouth daily. 90 tablet 2   atorvastatin (LIPITOR) 40 MG tablet Take 1 tablet by mouth once daily 90 tablet 1   Cholecalciferol (VITAMIN D3) 250 MCG (10000 UT) capsule Take 10,000 mcg by mouth daily.     diphenhydrAMINE HCl (BENADRYL ALLERGY PO) Take 25 mg by mouth at bedtime as needed (for sleep).     escitalopram (LEXAPRO) 10 MG tablet Take 1 tablet by mouth once daily 90 tablet 1   folic acid (FOLVITE) 1 MG tablet Take 1 tablet by mouth once daily 30 tablet 0   levothyroxine (SYNTHROID) 137 MCG tablet TAKE 1 TABLET BY MOUTH ONCE DAILY BEFORE BREAKFAST 30 tablet  2   losartan (COZAAR) 100 MG tablet Take 1 tablet by mouth once daily 90 tablet 1   metoprolol succinate (TOPROL-XL) 50 MG 24 hr tablet TAKE 1 TABLET BY MOUTH ONCE DAILY WITH A MEAL OR  IMMEDIATELY  FOLLOWING 90 tablet 1   Multiple Vitamins-Minerals (MULTIVITAMIN WITH MINERALS) tablet Take 1 tablet by mouth daily.     nitroGLYCERIN (NITROSTAT) 0.4 MG SL tablet Place 1 tablet (0.4 mg total) under the tongue every 5 (five) minutes as needed. For chest pain (Patient not taking: Reported on 10/26/2021) 25 tablet 6   Vitamin A 2400 MCG (8000 UT) TABS Take 2,400 mg by mouth daily.     No current facility-administered medications for this visit.    SURGICAL HISTORY:  Past Surgical History:  Procedure Laterality Date   CARDIAC CATHETERIZATION  01/02/2007   IT REVEALS MILD INFERIOR WALL HYPOKINESIS. THE EJECTION FRACTION IS AROUND 50%   COLONOSCOPY     CORONARY ARTERY BYPASS GRAFT  11/06/2006   LIMA to LAD, SVG to DX, SVG to LCX & SVG to OM 1 & 2, and SVG to PD   CORONARY STENT PLACEMENT     Remote past stent to RCA   EYE SURGERY Right    cataracts removed   HEMORRHOID SURGERY  11/07/1987   UPPER GI ENDOSCOPY     VENTRICULOSTOMY  10/06/2011   Procedure: VENTRICULOSTOMY;  Surgeon: Winfield Cunas;  Location: [facility];  Service: Neurosurgery;  Laterality: Right;  Insertion of Ventriculostomy Catheter   VIDEO BRONCHOSCOPY WITH ENDOBRONCHIAL NAVIGATION Left 05/20/2021   Procedure: VIDEO BRONCHOSCOPY WITH ENDOBRONCHIAL NAVIGATION;  Surgeon: Garner Nash, DO;  Location: [facility];  Service: Pulmonary;  Laterality: Left;   VIDEO BRONCHOSCOPY WITH ENDOBRONCHIAL ULTRASOUND Bilateral 05/20/2021   Procedure: VIDEO BRONCHOSCOPY WITH ENDOBRONCHIAL ULTRASOUND;  Surgeon: Garner Nash, DO;  Location: [facility];  Service: Pulmonary;  Laterality: Bilateral;   WISDOM TOOTH EXTRACTION      REVIEW OF SYSTEMS:  Constitutional: positive for fatigue Eyes: negative Ears, nose, mouth, throat, and face:  negative Respiratory: negative Cardiovascular: negative Gastrointestinal: negative Genitourinary:negative Integument/breast: negative Hematologic/lymphatic: negative Musculoskeletal:negative Neurological: negative Behavioral/Psych: negative Endocrine: negative Allergic/Immunologic: negative   PHYSICAL EXAMINATION: General appearance: alert, cooperative, fatigued, and no distress Head: Normocephalic, without obvious abnormality, atraumatic Neck: no adenopathy, no JVD, supple, symmetrical, trachea midline, and thyroid not enlarged, symmetric, no tenderness/mass/nodules Lymph nodes: Cervical, supraclavicular, and axillary nodes normal. Resp: clear to auscultation bilaterally Back: symmetric, no curvature. ROM normal. No CVA tenderness. Cardio: regular rate and rhythm, S1, S2 normal, no murmur, click, rub or gallop GI: soft, non-tender; bowel sounds normal; no masses,  no organomegaly Extremities: extremities normal, atraumatic, no cyanosis or edema Neurologic: Alert and oriented X 3,  normal strength and tone. Normal symmetric reflexes. Normal coordination and gait  ECOG PERFORMANCE STATUS: 1 - Symptomatic but completely ambulatory  Blood pressure (!) 141/58, pulse 68, temperature (!) 97.5 F (36.4 C), temperature source Oral, resp. rate 18, height '5\' 7"'  (1.702 m), weight 141 lb 4 oz (64.1 kg), SpO2 95 %.  LABORATORY DATA: Lab Results  Component Value Date   WBC 4.7 08/09/2022   HGB 11.7 (L) 08/09/2022   HCT 35.4 (L) 08/09/2022   MCV 105.7 (H) 08/09/2022   PLT 335 08/09/2022      Chemistry      Component Value Date/Time   NA 142 08/09/2022 0936   NA 141 04/12/2021 1356   K 3.9 08/09/2022 0936   CL 105 08/09/2022 0936   CO2 33 (H) 08/09/2022 0936   BUN 12 08/09/2022 0936   BUN 13 04/12/2021 1356   CREATININE 1.09 (H) 08/09/2022 0936   CREATININE 0.99 08/29/2016 1130      Component Value Date/Time   CALCIUM 9.2 08/09/2022 0936   ALKPHOS 77 08/09/2022 0936   AST 25  08/09/2022 0936   ALT 14 08/09/2022 0936   BILITOT 0.2 (L) 08/09/2022 0936       RADIOGRAPHIC STUDIES: CT Chest W Contrast  Result Date: 08/28/2022 CLINICAL DATA:  History of non-small cell lung cancer. Follow-up exam. * Tracking Code: BO * EXAM: CT CHEST, ABDOMEN, AND PELVIS WITH CONTRAST TECHNIQUE: Multidetector CT imaging of the chest, abdomen and pelvis was performed following the standard protocol during bolus administration of intravenous contrast. RADIATION DOSE REDUCTION: This exam was performed according to the departmental dose-optimization program which includes automated exposure control, adjustment of the mA and/or kV according to patient size and/or use of iterative reconstruction technique. CONTRAST:  151m OMNIPAQUE IOHEXOL 300 MG/ML  SOLN COMPARISON:  CT C AP June 25, 2022 FINDINGS: CT CHEST FINDINGS Cardiovascular: Normal heart size. Thoracic aortic vascular calcifications. No pericardial effusion. Mediastinum/Nodes: No enlarged axillary, mediastinal or hilar lymphadenopathy. Small hiatal hernia. Normal appearance of the esophagus. Lungs/Pleura: Central airways are patent. Centrilobular and paraseptal emphysematous change. Similar to minimal increase in size of spiculated consolidation posterior left upper lobe measuring 2.2 x 3.1 cm (image 58; series 4), previously 2.6 x 2.1 cm. There is a 5 mm right upper lobe nodule (image 41; series 4)., similar to prior. Similar 7 mm right upper lobe nodular opacity (image 28; series 4). Interval development of patchy nodular opacities within the subpleural medial left upper lobe (image 29; series 4) measuring 1.3 x 0.8 cm. Stable 6 mm left upper lobe pulmonary nodule (image 49; series 4). No pleural effusion or pneumothorax. Musculoskeletal: Prior median sternotomy. Thoracic spine degenerative changes. No aggressive or acute appearing osseous lesions. CT ABDOMEN PELVIS FINDINGS Hepatobiliary: Liver is normal in size and contour. Stable too small  to characterize subcentimeter low-attenuation hepatic lesions. Gallbladder is unremarkable. Similar 2.4 x 2.4 cm low-attenuation lesion porta hepatis (image 62; series 2), potentially choledochal cyst. Pancreas: Unremarkable Spleen: Unremarkable Adrenals/Urinary Tract: Stable appearance of the adrenal glands. Kidneys enhance symmetrically with contrast. Stable 1.5 cm exophytic hyperdense lesion off the superior pole of the left kidney (image 59; series 2). Stable exophytic left renal cyst measuring 2.8 cm (image 66; series 2). Unchanged subcentimeter low-attenuation lesion inferior pole right kidney. Urinary bladder is unremarkable. Stomach/Bowel: Oral contrast of the level of the rectum. Normal appendix. No abnormal bowel wall thickening or evidence for bowel obstruction. No free fluid or free intraperitoneal air. Normal morphology of the stomach. Vascular/Lymphatic: Normal  caliber abdominal aorta. Peripheral calcified atherosclerotic plaque. No retroperitoneal lymphadenopathy. Reproductive: Uterus and adnexal structures are unremarkable. Other: None. Musculoskeletal: Lumbar spine degenerative changes. No aggressive or acute appearing osseous lesions. IMPRESSION: 1. Similar to minimal increase in size of the spiculated consolidation within the posterior left upper lobe. 2. Interval development of subpleural ground-glass and consolidative opacities within the medial left upper lobe, nonspecific in etiology. Recommend attention on follow-up. 3. Stable indeterminate hyperdense lesion off the superior pole of the left kidney. Recommend continued attention on follow-up. 4. Stable low-attenuation lesion within the porta hepatis, potentially choledochal cyst. Recommend attention on follow-up. 5. Aortic atherosclerosis. 6. Emphysema. Electronically Signed   By: Lovey Newcomer M.D.   On: 08/28/2022 10:13   CT Abdomen Pelvis W Contrast  Result Date: 08/28/2022 CLINICAL DATA:  History of non-small cell lung cancer.  Follow-up exam. * Tracking Code: BO * EXAM: CT CHEST, ABDOMEN, AND PELVIS WITH CONTRAST TECHNIQUE: Multidetector CT imaging of the chest, abdomen and pelvis was performed following the standard protocol during bolus administration of intravenous contrast. RADIATION DOSE REDUCTION: This exam was performed according to the departmental dose-optimization program which includes automated exposure control, adjustment of the mA and/or kV according to patient size and/or use of iterative reconstruction technique. CONTRAST:  161m OMNIPAQUE IOHEXOL 300 MG/ML  SOLN COMPARISON:  CT C AP June 25, 2022 FINDINGS: CT CHEST FINDINGS Cardiovascular: Normal heart size. Thoracic aortic vascular calcifications. No pericardial effusion. Mediastinum/Nodes: No enlarged axillary, mediastinal or hilar lymphadenopathy. Small hiatal hernia. Normal appearance of the esophagus. Lungs/Pleura: Central airways are patent. Centrilobular and paraseptal emphysematous change. Similar to minimal increase in size of spiculated consolidation posterior left upper lobe measuring 2.2 x 3.1 cm (image 58; series 4), previously 2.6 x 2.1 cm. There is a 5 mm right upper lobe nodule (image 41; series 4)., similar to prior. Similar 7 mm right upper lobe nodular opacity (image 28; series 4). Interval development of patchy nodular opacities within the subpleural medial left upper lobe (image 29; series 4) measuring 1.3 x 0.8 cm. Stable 6 mm left upper lobe pulmonary nodule (image 49; series 4). No pleural effusion or pneumothorax. Musculoskeletal: Prior median sternotomy. Thoracic spine degenerative changes. No aggressive or acute appearing osseous lesions. CT ABDOMEN PELVIS FINDINGS Hepatobiliary: Liver is normal in size and contour. Stable too small to characterize subcentimeter low-attenuation hepatic lesions. Gallbladder is unremarkable. Similar 2.4 x 2.4 cm low-attenuation lesion porta hepatis (image 62; series 2), potentially choledochal cyst. Pancreas:  Unremarkable Spleen: Unremarkable Adrenals/Urinary Tract: Stable appearance of the adrenal glands. Kidneys enhance symmetrically with contrast. Stable 1.5 cm exophytic hyperdense lesion off the superior pole of the left kidney (image 59; series 2). Stable exophytic left renal cyst measuring 2.8 cm (image 66; series 2). Unchanged subcentimeter low-attenuation lesion inferior pole right kidney. Urinary bladder is unremarkable. Stomach/Bowel: Oral contrast of the level of the rectum. Normal appendix. No abnormal bowel wall thickening or evidence for bowel obstruction. No free fluid or free intraperitoneal air. Normal morphology of the stomach. Vascular/Lymphatic: Normal caliber abdominal aorta. Peripheral calcified atherosclerotic plaque. No retroperitoneal lymphadenopathy. Reproductive: Uterus and adnexal structures are unremarkable. Other: None. Musculoskeletal: Lumbar spine degenerative changes. No aggressive or acute appearing osseous lesions. IMPRESSION: 1. Similar to minimal increase in size of the spiculated consolidation within the posterior left upper lobe. 2. Interval development of subpleural ground-glass and consolidative opacities within the medial left upper lobe, nonspecific in etiology. Recommend attention on follow-up. 3. Stable indeterminate hyperdense lesion off the superior pole of the left  kidney. Recommend continued attention on follow-up. 4. Stable low-attenuation lesion within the porta hepatis, potentially choledochal cyst. Recommend attention on follow-up. 5. Aortic atherosclerosis. 6. Emphysema. Electronically Signed   By: Lovey Newcomer M.D.   On: 08/28/2022 10:13    ASSESSMENT AND PLAN: This is a very pleasant 71 years old white female diagnosed with a stage IV (T2 a, N2, M1 B) non-small cell lung cancer, adenocarcinoma presented with left upper lobe lung mass in addition to left hilar and precarinal metastatic adenopathy as well as small left lower lobe pulmonary nodule and solitary left  frontal brain metastasis diagnosed in July 2022.  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 The patient is status post SRS to the solitary brain metastasis. She underwent a course of concurrent chemoradiation to the locally advanced disease in the chest with carboplatin for AUC of 2 and paclitaxel 45 Mg/M2 status post 7 cycles.  The patient has been tolerating her treatment well with no concerning adverse effects. Her scan showed mild improvement of her disease with no concerning findings for progression. Technically the patient has a stage IV lung cancer with brain metastasis  The patient is currently undergoing systemic chemotherapy with carboplatin for AUC of 5, Alimta 500 Mg/M2 and Keytruda 200 Mg IV every 3 weeks status post 16 cycles.  Starting from cycle #5 the patient will be on maintenance treatment with Alimta and Keytruda every 3 weeks.   She has been tolerating her maintenance therapy fairly well with no concerning adverse effect except for mild fatigue. She had repeat CT scan of the chest, abdomen and pelvis performed recently.  I personally and independently reviewed the scan and discussed the result with the patient today. Her scan showed no concerning findings for disease progression. I recommended for the patient to continue her current maintenance therapy and she will proceed with cycle #17 today. For the fatigue, I strongly encouraged the patient to increase her exercise level. I will see her back for follow-up visit in 3 weeks for evaluation before the next cycle of her treatment. She was advised to call immediately if she has any other concerning symptoms in the interval. The patient voices understanding of current disease status and treatment options and is in agreement with the current care plan. The total time spent in the appointment was 30 minutes.  All questions were answered. The patient knows to call the clinic with any  problems, questions or concerns. We can certainly see the patient much sooner if necessary.  Disclaimer: This note was dictated with voice recognition software. Similar sounding words can inadvertently be transcribed and may not be corrected upon review.

## 2022-08-30 NOTE — Patient Instructions (Signed)
[facility]  Discharge Instructions: Thank you for choosing [facility] to provide your oncology and hematology care.   If you have a lab appointment with the [facility], please go directly to the [facility] and check in at the registration area.   Wear comfortable clothing and clothing appropriate for easy access to any Portacath or PICC line.   We strive to give you quality time with your provider. You may need to reschedule your appointment if you arrive late (15 or more minutes).  Arriving late affects you and other patients whose appointments are after yours.  Also, if you miss three or more appointments without notifying the office, you may be dismissed from the clinic at the provider's discretion.      For prescription refill requests, have your pharmacy contact our office and allow 72 hours for refills to be completed.    Today you received the following chemotherapy and/or immunotherapy agents: Keytruda, Alimta.       To help prevent nausea and vomiting after your treatment, we encourage you to take your nausea medication as directed.  BELOW ARE SYMPTOMS THAT SHOULD BE REPORTED IMMEDIATELY: *FEVER GREATER THAN 100.4 F (38 C) OR HIGHER *CHILLS OR SWEATING *NAUSEA AND VOMITING THAT IS NOT CONTROLLED WITH YOUR NAUSEA MEDICATION *UNUSUAL SHORTNESS OF BREATH *UNUSUAL BRUISING OR BLEEDING *URINARY PROBLEMS (pain or burning when urinating, or frequent urination) *BOWEL PROBLEMS (unusual diarrhea, constipation, pain near the anus) TENDERNESS IN MOUTH AND THROAT WITH OR WITHOUT PRESENCE OF ULCERS (sore throat, sores in mouth, or a toothache) UNUSUAL RASH, SWELLING OR PAIN  UNUSUAL VAGINAL DISCHARGE OR ITCHING   Items with * indicate a potential emergency and should be followed up as soon as possible or go to the Emergency Department if any problems should occur.  Please show the CHEMOTHERAPY ALERT CARD or IMMUNOTHERAPY ALERT CARD at  check-in to the Emergency Department and triage nurse.  Should you have questions after your visit or need to cancel or reschedule your appointment, please contact [facility]  Dept: [phone]  and follow the prompts.  Office hours are 8:00 a.m. to 4:30 p.m. Monday - Friday. Please note that voicemails left after 4:00 p.m. may not be returned until the following business day.  We are closed weekends and major holidays. You have access to a nurse at all times for urgent questions. Please call the main number to the clinic Dept: [phone] and follow the prompts.   For any non-urgent questions, you may also contact your provider using MyChart. We now offer e-Visits for anyone 75 and older to request care online for non-urgent symptoms. For details visit [email or web address].   Also download the MyChart app! Go to the app store, search "MyChart", open the app, select [facility], and log in with your MyChart username and password.  Masks are optional in the cancer centers. If you would like for your care team to wear a mask while they are taking care of you, please let them know. You may have one support person who is at least 73 years old accompany you for your appointments.

## 2022-08-30 NOTE — Patient Instructions (Signed)
Steps to Quit Smoking Smoking tobacco is the leading cause of preventable death. It can affect almost every organ in the body. Smoking puts you and people around you at risk for many serious, long-lasting (chronic) diseases. Quitting smoking can be hard, but it is one of the best things that you can do for your health. It is never too late to quit. Do not give up if you cannot quit the first time. Some people need to try many times to quit. Do your best to stick to your quit plan, and talk with your doctor if you have any questions or concerns. How do I get ready to quit? Pick a date to quit. Set a date within the next 2 weeks to give you time to prepare. Write down the reasons why you are quitting. Keep this list in places where you will see it often. Tell your family, friends, and co-workers that you are quitting. Their support is important. Talk with your doctor about the choices that may help you quit. Find out if your health insurance will pay for these treatments. Know the people, places, things, and activities that make you want to smoke (triggers). Avoid them. What first steps can I take to quit smoking? Throw away all cigarettes at home, at work, and in your car. Throw away the things that you use when you smoke, such as ashtrays and lighters. Clean your car. Empty the ashtray. Clean your home, including curtains and carpets. What can I do to help me quit smoking? Talk with your doctor about taking medicines and seeing a counselor. You are more likely to succeed when you do both. If you are pregnant or breastfeeding: Talk with your doctor about counseling or other ways to quit smoking. Do not take medicine to help you quit smoking unless your doctor tells you to. Quit right away Quit smoking completely, instead of slowly cutting back on how much you smoke over a period of time. Stopping smoking right away may be more successful than slowly quitting. Go to counseling. In-person is best  if this is an option. You are more likely to quit if you go to counseling sessions regularly. Take medicine You may take medicines to help you quit. Some medicines need a prescription, and some you can buy over-the-counter. Some medicines may contain a drug called nicotine to replace the nicotine in cigarettes. Medicines may: Help you stop having the desire to smoke (cravings). Help to stop the problems that come when you stop smoking (withdrawal symptoms). Your doctor may ask you to use: Nicotine patches, gum, or lozenges. Nicotine inhalers or sprays. Non-nicotine medicine that you take by mouth. Find resources Find resources and other ways to help you quit smoking and remain smoke-free after you quit. They include: Online chats with a counselor. Phone quitlines. Printed self-help materials. Support groups or group counseling. Text messaging programs. Mobile phone apps. Use apps on your mobile phone or tablet that can help you stick to your quit plan. Examples of free services include Quit Guide from the CDC and smokefree.gov  What can I do to make it easier to quit?  Talk to your family and friends. Ask them to support and encourage you. Call a phone quitline, such as 1-800-QUIT-NOW, reach out to support groups, or work with a counselor. Ask people who smoke to not smoke around you. Avoid places that make you want to smoke, such as: Bars. Parties. Smoke-break areas at work. Spend time with people who do not smoke. Lower   the stress in your life. Stress can make you want to smoke. Try these things to lower stress: Getting regular exercise. Doing deep-breathing exercises. Doing yoga. Meditating. What benefits will I see if I quit smoking? Over time, you may have: A better sense of smell and taste. Less coughing and sore throat. A slower heart rate. Lower blood pressure. Clearer skin. Better breathing. Fewer sick days. Summary Quitting smoking can be hard, but it is one of  the best things that you can do for your health. Do not give up if you cannot quit the first time. Some people need to try many times to quit. When you decide to quit smoking, make a plan to help you succeed. Quit smoking right away, not slowly over a period of time. When you start quitting, get help and support to keep you smoke-free. This information is not intended to replace advice given to you by your health care provider. Make sure you discuss any questions you have with your health care provider. Document Revised: 10/14/2021 Document Reviewed: 10/14/2021 Elsevier Patient Education  2023 [employer]  

## 2022-09-06 ENCOUNTER — Other Ambulatory Visit: Payer: Self-pay | Admitting: Internal Medicine

## 2022-09-07 ENCOUNTER — Telehealth: Payer: Self-pay | Admitting: Radiation Therapy

## 2022-09-07 ENCOUNTER — Other Ambulatory Visit: Payer: Self-pay | Admitting: Radiation Therapy

## 2022-09-07 DIAGNOSIS — C7931 Secondary malignant neoplasm of brain: Secondary | ICD-10-CM

## 2022-09-07 NOTE — Telephone Encounter (Signed)
Left a detailed voicemail about the patients upcoming brain MRI and telephone follow-up with Ashlyn Bruning PA-C in December. The message included the appointment information, my name and contact information to call back if she has questions or concerns.    Mont Dutton R.T.(R)(T) Radiation Special Procedures Navigator

## 2022-09-19 ENCOUNTER — Other Ambulatory Visit: Payer: Self-pay | Admitting: *Deleted

## 2022-09-19 DIAGNOSIS — C3412 Malignant neoplasm of upper lobe, left bronchus or lung: Secondary | ICD-10-CM

## 2022-09-20 ENCOUNTER — Other Ambulatory Visit: Payer: Self-pay | Admitting: Internal Medicine

## 2022-09-20 ENCOUNTER — Other Ambulatory Visit: Payer: Self-pay

## 2022-09-20 ENCOUNTER — Encounter: Payer: Self-pay | Admitting: Internal Medicine

## 2022-09-20 ENCOUNTER — Inpatient Hospital Stay: Payer: Medicare HMO | Attending: Internal Medicine

## 2022-09-20 ENCOUNTER — Inpatient Hospital Stay: Payer: Medicare HMO

## 2022-09-20 ENCOUNTER — Other Ambulatory Visit: Payer: Self-pay | Admitting: Medical Oncology

## 2022-09-20 ENCOUNTER — Inpatient Hospital Stay (HOSPITAL_BASED_OUTPATIENT_CLINIC_OR_DEPARTMENT_OTHER): Payer: Medicare HMO | Admitting: Internal Medicine

## 2022-09-20 VITALS — BP 125/66 | HR 94 | Temp 98.2°F | Resp 18 | Ht 67.0 in | Wt 141.3 lb

## 2022-09-20 DIAGNOSIS — Z8719 Personal history of other diseases of the digestive system: Secondary | ICD-10-CM | POA: Diagnosis not present

## 2022-09-20 DIAGNOSIS — R058 Other specified cough: Secondary | ICD-10-CM | POA: Diagnosis not present

## 2022-09-20 DIAGNOSIS — M47814 Spondylosis without myelopathy or radiculopathy, thoracic region: Secondary | ICD-10-CM | POA: Diagnosis not present

## 2022-09-20 DIAGNOSIS — R21 Rash and other nonspecific skin eruption: Secondary | ICD-10-CM | POA: Diagnosis not present

## 2022-09-20 DIAGNOSIS — C3412 Malignant neoplasm of upper lobe, left bronchus or lung: Secondary | ICD-10-CM | POA: Insufficient documentation

## 2022-09-20 DIAGNOSIS — Z87440 Personal history of urinary (tract) infections: Secondary | ICD-10-CM | POA: Diagnosis not present

## 2022-09-20 DIAGNOSIS — E039 Hypothyroidism, unspecified: Secondary | ICD-10-CM | POA: Insufficient documentation

## 2022-09-20 DIAGNOSIS — Z9221 Personal history of antineoplastic chemotherapy: Secondary | ICD-10-CM | POA: Insufficient documentation

## 2022-09-20 DIAGNOSIS — Z923 Personal history of irradiation: Secondary | ICD-10-CM | POA: Diagnosis not present

## 2022-09-20 DIAGNOSIS — Z79890 Hormone replacement therapy (postmenopausal): Secondary | ICD-10-CM | POA: Insufficient documentation

## 2022-09-20 DIAGNOSIS — C7931 Secondary malignant neoplasm of brain: Secondary | ICD-10-CM | POA: Diagnosis not present

## 2022-09-20 DIAGNOSIS — M47816 Spondylosis without myelopathy or radiculopathy, lumbar region: Secondary | ICD-10-CM | POA: Diagnosis not present

## 2022-09-20 DIAGNOSIS — Z882 Allergy status to sulfonamides status: Secondary | ICD-10-CM | POA: Insufficient documentation

## 2022-09-20 DIAGNOSIS — K449 Diaphragmatic hernia without obstruction or gangrene: Secondary | ICD-10-CM | POA: Diagnosis not present

## 2022-09-20 DIAGNOSIS — Z5111 Encounter for antineoplastic chemotherapy: Secondary | ICD-10-CM | POA: Insufficient documentation

## 2022-09-20 DIAGNOSIS — K769 Liver disease, unspecified: Secondary | ICD-10-CM | POA: Insufficient documentation

## 2022-09-20 DIAGNOSIS — N281 Cyst of kidney, acquired: Secondary | ICD-10-CM | POA: Insufficient documentation

## 2022-09-20 DIAGNOSIS — R079 Chest pain, unspecified: Secondary | ICD-10-CM | POA: Diagnosis not present

## 2022-09-20 DIAGNOSIS — Z880 Allergy status to penicillin: Secondary | ICD-10-CM | POA: Insufficient documentation

## 2022-09-20 DIAGNOSIS — Z5112 Encounter for antineoplastic immunotherapy: Secondary | ICD-10-CM

## 2022-09-20 DIAGNOSIS — E785 Hyperlipidemia, unspecified: Secondary | ICD-10-CM | POA: Insufficient documentation

## 2022-09-20 DIAGNOSIS — J432 Centrilobular emphysema: Secondary | ICD-10-CM | POA: Insufficient documentation

## 2022-09-20 DIAGNOSIS — R0609 Other forms of dyspnea: Secondary | ICD-10-CM | POA: Insufficient documentation

## 2022-09-20 DIAGNOSIS — Z888 Allergy status to other drugs, medicaments and biological substances status: Secondary | ICD-10-CM | POA: Insufficient documentation

## 2022-09-20 DIAGNOSIS — I700 Atherosclerosis of aorta: Secondary | ICD-10-CM | POA: Insufficient documentation

## 2022-09-20 DIAGNOSIS — Z79899 Other long term (current) drug therapy: Secondary | ICD-10-CM | POA: Insufficient documentation

## 2022-09-20 DIAGNOSIS — I252 Old myocardial infarction: Secondary | ICD-10-CM | POA: Insufficient documentation

## 2022-09-20 DIAGNOSIS — I878 Other specified disorders of veins: Secondary | ICD-10-CM

## 2022-09-20 LAB — CMP (CANCER CENTER ONLY)
ALT: 15 U/L (ref 0–44)
AST: 24 U/L (ref 15–41)
Albumin: 3.6 g/dL (ref 3.5–5.0)
Alkaline Phosphatase: 84 U/L (ref 38–126)
Anion gap: 7 (ref 5–15)
BUN: 18 mg/dL (ref 8–23)
CO2: 30 mmol/L (ref 22–32)
Calcium: 9.3 mg/dL (ref 8.9–10.3)
Chloride: 101 mmol/L (ref 98–111)
Creatinine: 0.9 mg/dL (ref 0.44–1.00)
GFR, Estimated: >60 mL/min (ref >60)
Glucose, Bld: 113 mg/dL — ABNORMAL HIGH (ref 70–99)
Potassium: 3.7 mmol/L (ref 3.5–5.1)
Sodium: 138 mmol/L (ref 135–145)
Total Bilirubin: 0.2 mg/dL — ABNORMAL LOW (ref 0.3–1.2)
Total Protein: 7.3 g/dL (ref 6.5–8.1)

## 2022-09-20 LAB — CBC WITH DIFFERENTIAL (CANCER CENTER ONLY)
Abs Immature Granulocytes: 0.04 10*3/uL (ref 0.00–0.07)
Basophils Absolute: 0.1 10*3/uL (ref 0.0–0.1)
Basophils Relative: 1 %
Eosinophils Absolute: 0.2 10*3/uL (ref 0.0–0.5)
Eosinophils Relative: 2 %
HCT: 31.7 % — ABNORMAL LOW (ref 36.0–46.0)
Hemoglobin: 10.8 g/dL — ABNORMAL LOW (ref 12.0–15.0)
Immature Granulocytes: 1 %
Lymphocytes Relative: 21 %
Lymphs Abs: 1.4 10*3/uL (ref 0.7–4.0)
MCH: 35 pg — ABNORMAL HIGH (ref 26.0–34.0)
MCHC: 34.1 g/dL (ref 30.0–36.0)
MCV: 102.6 fL — ABNORMAL HIGH (ref 80.0–100.0)
Monocytes Absolute: 0.8 10*3/uL (ref 0.1–1.0)
Monocytes Relative: 12 %
Neutro Abs: 4.4 10*3/uL (ref 1.7–7.7)
Neutrophils Relative %: 63 %
Platelet Count: 366 10*3/uL (ref 150–400)
RBC: 3.09 MIL/uL — ABNORMAL LOW (ref 3.87–5.11)
RDW: 13.9 % (ref 11.5–15.5)
WBC Count: 6.9 10*3/uL (ref 4.0–10.5)
nRBC: 0 % (ref 0.0–0.2)

## 2022-09-20 LAB — TSH: TSH: 10.255 u[IU]/mL — ABNORMAL HIGH (ref 0.350–4.500)

## 2022-09-20 MED ORDER — AMOXICILLIN-POT CLAVULANATE 875-125 MG PO TABS: 1.0000 | ORAL_TABLET | Freq: Two times a day (BID) | ORAL | 0 refills | Status: DC

## 2022-09-20 MED ORDER — PROCHLORPERAZINE MALEATE 10 MG PO TABS
10.0000 mg | ORAL_TABLET | Freq: Once | ORAL | Status: AC
  Administered 2022-09-20: 10 mg via ORAL
  Filled 2022-09-20: qty 1

## 2022-09-20 MED ORDER — SODIUM CHLORIDE 0.9 % IV SOLN: Freq: Once | INTRAVENOUS | Status: AC

## 2022-09-20 MED ORDER — CYANOCOBALAMIN 1000 MCG/ML IJ SOLN
1000.0000 ug | Freq: Once | INTRAMUSCULAR | Status: AC
  Administered 2022-09-20: 1000 ug via INTRAMUSCULAR
  Filled 2022-09-20: qty 1

## 2022-09-20 MED ORDER — SODIUM CHLORIDE 0.9 % IV SOLN
500.0000 mg/m2 | Freq: Once | INTRAVENOUS | Status: AC
  Administered 2022-09-20: 900 mg via INTRAVENOUS
  Filled 2022-09-20: qty 20

## 2022-09-20 MED ORDER — SODIUM CHLORIDE 0.9 % IV SOLN
200.0000 mg | Freq: Once | INTRAVENOUS | Status: AC
  Administered 2022-09-20: 200 mg via INTRAVENOUS
  Filled 2022-09-20: qty 200

## 2022-09-20 NOTE — Patient Instructions (Signed)
Steps to Quit Smoking Smoking tobacco is the leading cause of preventable death. It can affect almost every organ in the body. Smoking puts you and people around you at risk for many serious, long-lasting (chronic) diseases. Quitting smoking can be hard, but it is one of the best things that you can do for your health. It is never too late to quit. Do not give up if you cannot quit the first time. Some people need to try many times to quit. Do your best to stick to your quit plan, and talk with your doctor if you have any questions or concerns. How do I get ready to quit? Pick a date to quit. Set a date within the next 2 weeks to give you time to prepare. Write down the reasons why you are quitting. Keep this list in places where you will see it often. Tell your family, friends, and co-workers that you are quitting. Their support is important. Talk with your doctor about the choices that may help you quit. Find out if your health insurance will pay for these treatments. Know the people, places, things, and activities that make you want to smoke (triggers). Avoid them. What first steps can I take to quit smoking? Throw away all cigarettes at home, at work, and in your car. Throw away the things that you use when you smoke, such as ashtrays and lighters. Clean your car. Empty the ashtray. Clean your home, including curtains and carpets. What can I do to help me quit smoking? Talk with your doctor about taking medicines and seeing a counselor. You are more likely to succeed when you do both. If you are pregnant or breastfeeding: Talk with your doctor about counseling or other ways to quit smoking. Do not take medicine to help you quit smoking unless your doctor tells you to. Quit right away Quit smoking completely, instead of slowly cutting back on how much you smoke over a period of time. Stopping smoking right away may be more successful than slowly quitting. Go to counseling. In-person is best  if this is an option. You are more likely to quit if you go to counseling sessions regularly. Take medicine You may take medicines to help you quit. Some medicines need a prescription, and some you can buy over-the-counter. Some medicines may contain a drug called nicotine to replace the nicotine in cigarettes. Medicines may: Help you stop having the desire to smoke (cravings). Help to stop the problems that come when you stop smoking (withdrawal symptoms). Your doctor may ask you to use: Nicotine patches, gum, or lozenges. Nicotine inhalers or sprays. Non-nicotine medicine that you take by mouth. Find resources Find resources and other ways to help you quit smoking and remain smoke-free after you quit. They include: Online chats with a counselor. Phone quitlines. Printed self-help materials. Support groups or group counseling. Text messaging programs. Mobile phone apps. Use apps on your mobile phone or tablet that can help you stick to your quit plan. Examples of free services include Quit Guide from the CDC and smokefree.gov  What can I do to make it easier to quit?  Talk to your family and friends. Ask them to support and encourage you. Call a phone quitline, such as 1-800-QUIT-NOW, reach out to support groups, or work with a counselor. Ask people who smoke to not smoke around you. Avoid places that make you want to smoke, such as: Bars. Parties. Smoke-break areas at work. Spend time with people who do not smoke. Lower   the stress in your life. Stress can make you want to smoke. Try these things to lower stress: Getting regular exercise. Doing deep-breathing exercises. Doing yoga. Meditating. What benefits will I see if I quit smoking? Over time, you may have: A better sense of smell and taste. Less coughing and sore throat. A slower heart rate. Lower blood pressure. Clearer skin. Better breathing. Fewer sick days. Summary Quitting smoking can be hard, but it is one of  the best things that you can do for your health. Do not give up if you cannot quit the first time. Some people need to try many times to quit. When you decide to quit smoking, make a plan to help you succeed. Quit smoking right away, not slowly over a period of time. When you start quitting, get help and support to keep you smoke-free. This information is not intended to replace advice given to you by your health care provider. Make sure you discuss any questions you have with your health care provider. Document Revised: 10/14/2021 Document Reviewed: 10/14/2021 Elsevier Patient Education  2023 [employer]  

## 2022-09-20 NOTE — Patient Instructions (Signed)
[facility]  Discharge Instructions: Thank you for choosing [facility] to provide your oncology and hematology care.   If you have a lab appointment with the [facility], please go directly to the [facility] and check in at the registration area.   Wear comfortable clothing and clothing appropriate for easy access to any Portacath or PICC line.   We strive to give you quality time with your provider. You may need to reschedule your appointment if you arrive late (15 or more minutes).  Arriving late affects you and other patients whose appointments are after yours.  Also, if you miss three or more appointments without notifying the office, you may be dismissed from the clinic at the provider's discretion.      For prescription refill requests, have your pharmacy contact our office and allow 72 hours for refills to be completed.    Today you received the following chemotherapy and/or immunotherapy agents: Keytruda/Alimta     To help prevent nausea and vomiting after your treatment, we encourage you to take your nausea medication as directed.  BELOW ARE SYMPTOMS THAT SHOULD BE REPORTED IMMEDIATELY: *FEVER GREATER THAN 100.4 F (38 C) OR HIGHER *CHILLS OR SWEATING *NAUSEA AND VOMITING THAT IS NOT CONTROLLED WITH YOUR NAUSEA MEDICATION *UNUSUAL SHORTNESS OF BREATH *UNUSUAL BRUISING OR BLEEDING *URINARY PROBLEMS (pain or burning when urinating, or frequent urination) *BOWEL PROBLEMS (unusual diarrhea, constipation, pain near the anus) TENDERNESS IN MOUTH AND THROAT WITH OR WITHOUT PRESENCE OF ULCERS (sore throat, sores in mouth, or a toothache) UNUSUAL RASH, SWELLING OR PAIN  UNUSUAL VAGINAL DISCHARGE OR ITCHING   Items with * indicate a potential emergency and should be followed up as soon as possible or go to the Emergency Department if any problems should occur.  Please show the CHEMOTHERAPY ALERT CARD or IMMUNOTHERAPY ALERT CARD at  check-in to the Emergency Department and triage nurse.  Should you have questions after your visit or need to cancel or reschedule your appointment, please contact [facility]  Dept: [phone]  and follow the prompts.  Office hours are 8:00 a.m. to 4:30 p.m. Monday - Friday. Please note that voicemails left after 4:00 p.m. may not be returned until the following business day.  We are closed weekends and major holidays. You have access to a nurse at all times for urgent questions. Please call the main number to the clinic Dept: [phone] and follow the prompts.   For any non-urgent questions, you may also contact your provider using MyChart. We now offer e-Visits for anyone 83 and older to request care online for non-urgent symptoms. For details visit [email or web address].   Also download the MyChart app! Go to the app store, search "MyChart", open the app, select [facility], and log in with your MyChart username and password.  Masks are optional in the cancer centers. If you would like for your care team to wear a mask while they are taking care of you, please let them know. You may have one support person who is at least 73 years old accompany you for your appointments.

## 2022-09-20 NOTE — Progress Notes (Signed)
[facility] Telephone:([phone]) [phone]   Fax:([phone]  OFFICE PROGRESS NOTE  [name], MD [place] [address]  DIAGNOSIS: Stage IV (T2 a, N2, M1b) non-small cell lung cancer, adenocarcinoma presented with left upper lobe lung mass in addition to left hilar and precarinal metastatic adenopathy and small left lower lobe pulmonary nodule in addition to solitary left frontal brain metastasis diagnosed in July 2022.  Molecular studies by [facility] showed  Positive KRAS G12C mutation PD-L1 expression was less than 1%   PRIOR THERAPY:  1) Status post SRS to the solitary brain metastasis. 20 Treatment for the locally advanced disease in the chest with carboplatin for AUC of 2 and paclitaxel 45 Mg/M2.  Status post 6 cycles.  Last dose was given 07/25/2021.  CURRENT THERAPY: Systemic chemotherapy with carboplatin for AUC of 5, Alimta 500 Mg/M2 and Keytruda 200 Mg IV every 3 weeks.  First dose August 31, 2021.  Status post 17 cycles.  Starting from cycle #5 the patient will be on maintenance treatment with Alimta and Keytruda every 3 weeks.  INTERVAL HISTORY: Jill [name] 71 y.o. female returns to the clinic today for follow-up visit.  The patient is feeling fine today with no concerning complaints except for cough productive of yellowish sputum.  She has chest pain and shortness of breath when she coughs.  She denied having any nausea, vomiting, diarrhea or constipation.  She has no headache or visual changes.  She has no recent weight loss or night sweats.  She has been tolerating her treatment with maintenance Alimta and Keytruda fairly well.  She is here today for evaluation before starting cycle #18.  MEDICAL HISTORY: Past Medical History:  Diagnosis Date   Anxiety    ON PAXIL, XANAX   AVM (arteriovenous malformation) brain    s/p stent/coil   Blood transfusion    x 2   Cancer (HCC)    Left lung   COPD (chronic obstructive  pulmonary disease) (HCC)    no inhaler, no oxygen   Coronary artery disease    Prior inferior MI with stent to RCA, s/p CABG in 2008   Depression    Dizziness    Dyspnea    with exertion, no oxygen   Fatigue    Foot injury 06/01/2017   right - RESOLVED, no longer an issue per patient 05/18/21   GERD (gastroesophageal reflux disease)    Headache(784.0)    UNRUPTURED CEREBRAL ANEURYSM   Hyperlipidemia    Hypertension    Hypothyroidism    Infected cyst of skin 09/18/2013   Left knee pain 06/05/2012   Lung mass    Left lung   Myocardial infarction ([facility])    Normal nuclear stress test Ju;y 2012   No ischemia. EF 70%; fixed defect involving septum, inferoseptal and inferior wall   Pain, dental 02/06/2017   Poor dental hygiene    Retroperitoneal bleeding    Following cardiac cath   Tobacco abuse    Urine discoloration 09/18/2013   Urine incontinence 07/06/2020   UTI (lower urinary tract infection) 10/16/2013    ALLERGIES:  is allergic to varenicline, ace inhibitors, prednisone, betalin 12 [vitamin b12], penicillins, and sulfa drugs cross reactors.  MEDICATIONS:  Current Outpatient Medications  Medication Sig Dispense Refill   acetaminophen (TYLENOL) 500 MG tablet Take 500 mg by mouth every 6 (six) hours as needed.     amLODipine (NORVASC) 10 MG tablet Take 1 tablet by  mouth once daily 90 tablet 1   aspirin EC 81 MG tablet Take 1 tablet (81 mg total) by mouth daily. 90 tablet 2   atorvastatin (LIPITOR) 40 MG tablet Take 1 tablet by mouth once daily 90 tablet 1   Cholecalciferol (VITAMIN D3) 250 MCG (10000 UT) capsule Take 10,000 mcg by mouth daily.     diphenhydrAMINE HCl (BENADRYL ALLERGY PO) Take 25 mg by mouth at bedtime as needed (for sleep).     escitalopram (LEXAPRO) 10 MG tablet Take 1 tablet by mouth once daily 90 tablet 1   folic acid (FOLVITE) 1 MG tablet Take 1 tablet by mouth once daily 30 tablet 0   levothyroxine (SYNTHROID) 137 MCG tablet TAKE 1 TABLET BY MOUTH ONCE  DAILY BEFORE BREAKFAST 30 tablet 2   losartan (COZAAR) 100 MG tablet Take 1 tablet by mouth once daily 90 tablet 1   metoprolol succinate (TOPROL-XL) 50 MG 24 hr tablet TAKE 1 TABLET BY MOUTH ONCE DAILY WITH A MEAL OR  IMMEDIATELY  FOLLOWING 90 tablet 1   Multiple Vitamins-Minerals (MULTIVITAMIN WITH MINERALS) tablet Take 1 tablet by mouth daily.     nitroGLYCERIN (NITROSTAT) 0.4 MG SL tablet Place 1 tablet (0.4 mg total) under the tongue every 5 (five) minutes as needed. For chest pain (Patient not taking: Reported on 10/26/2021) 25 tablet 6   Vitamin A 2400 MCG (8000 UT) TABS Take 2,400 mg by mouth daily.     No current facility-administered medications for this visit.    SURGICAL HISTORY:  Past Surgical History:  Procedure Laterality Date   CARDIAC CATHETERIZATION  01/02/2007   IT REVEALS MILD INFERIOR WALL HYPOKINESIS. THE EJECTION FRACTION IS AROUND 50%   COLONOSCOPY     CORONARY ARTERY BYPASS GRAFT  11/06/2006   LIMA to LAD, SVG to DX, SVG to LCX & SVG to OM 1 & 2, and SVG to PD   CORONARY STENT PLACEMENT     Remote past stent to RCA   EYE SURGERY Right    cataracts removed   HEMORRHOID SURGERY  11/07/1987   UPPER GI ENDOSCOPY     VENTRICULOSTOMY  10/06/2011   Procedure: VENTRICULOSTOMY;  Surgeon: Winfield Cunas;  Location: [facility];  Service: Neurosurgery;  Laterality: Right;  Insertion of Ventriculostomy Catheter   VIDEO BRONCHOSCOPY WITH ENDOBRONCHIAL NAVIGATION Left 05/20/2021   Procedure: VIDEO BRONCHOSCOPY WITH ENDOBRONCHIAL NAVIGATION;  Surgeon: Garner Nash, DO;  Location: [facility];  Service: Pulmonary;  Laterality: Left;   VIDEO BRONCHOSCOPY WITH ENDOBRONCHIAL ULTRASOUND Bilateral 05/20/2021   Procedure: VIDEO BRONCHOSCOPY WITH ENDOBRONCHIAL ULTRASOUND;  Surgeon: Garner Nash, DO;  Location: [facility];  Service: Pulmonary;  Laterality: Bilateral;   WISDOM TOOTH EXTRACTION      REVIEW OF SYSTEMS:  Constitutional: positive for fatigue Eyes: negative Ears, nose,  mouth, throat, and face: negative Respiratory: positive for cough, dyspnea on exertion, and sputum Cardiovascular: negative Gastrointestinal: negative Genitourinary:negative Integument/breast: negative Hematologic/lymphatic: negative Musculoskeletal:negative Neurological: negative Behavioral/Psych: negative Endocrine: negative Allergic/Immunologic: negative   PHYSICAL EXAMINATION: General appearance: alert, cooperative, fatigued, and no distress Head: Normocephalic, without obvious abnormality, atraumatic Neck: no adenopathy, no JVD, supple, symmetrical, trachea midline, and thyroid not enlarged, symmetric, no tenderness/mass/nodules Lymph nodes: Cervical, supraclavicular, and axillary nodes normal. Resp: clear to auscultation bilaterally Back: symmetric, no curvature. ROM normal. No CVA tenderness. Cardio: regular rate and rhythm, S1, S2 normal, no murmur, click, rub or gallop GI: soft, non-tender; bowel sounds normal; no masses,  no organomegaly Extremities: extremities normal, atraumatic, no cyanosis  or edema Neurologic: Alert and oriented X 3, normal strength and tone. Normal symmetric reflexes. Normal coordination and gait  ECOG PERFORMANCE STATUS: 1 - Symptomatic but completely ambulatory  Blood pressure (!) 141/58, pulse 68, temperature (!) 97.5 F (36.4 C), temperature source Oral, resp. rate 18, height 5' 7" (1.702 m), weight 141 lb 4 oz (64.1 kg), SpO2 95 %.  LABORATORY DATA: Lab Results  Component Value Date   WBC 4.7 08/09/2022   HGB 11.7 (L) 08/09/2022   HCT 35.4 (L) 08/09/2022   MCV 105.7 (H) 08/09/2022   PLT 335 08/09/2022      Chemistry      Component Value Date/Time   NA 142 08/09/2022 0936   NA 141 04/12/2021 1356   K 3.9 08/09/2022 0936   CL 105 08/09/2022 0936   CO2 33 (H) 08/09/2022 0936   BUN 12 08/09/2022 0936   BUN 13 04/12/2021 1356   CREATININE 1.09 (H) 08/09/2022 0936   CREATININE 0.99 08/29/2016 1130      Component Value Date/Time    CALCIUM 9.2 08/09/2022 0936   ALKPHOS 77 08/09/2022 0936   AST 25 08/09/2022 0936   ALT 14 08/09/2022 0936   BILITOT 0.2 (L) 08/09/2022 0936       RADIOGRAPHIC STUDIES: CT Chest W Contrast  Result Date: 08/28/2022 CLINICAL DATA:  History of non-small cell lung cancer. Follow-up exam. * Tracking Code: BO * EXAM: CT CHEST, ABDOMEN, AND PELVIS WITH CONTRAST TECHNIQUE: Multidetector CT imaging of the chest, abdomen and pelvis was performed following the standard protocol during bolus administration of intravenous contrast. RADIATION DOSE REDUCTION: This exam was performed according to the departmental dose-optimization program which includes automated exposure control, adjustment of the mA and/or kV according to patient size and/or use of iterative reconstruction technique. CONTRAST:  155m OMNIPAQUE IOHEXOL 300 MG/ML  SOLN COMPARISON:  CT C AP June 25, 2022 FINDINGS: CT CHEST FINDINGS Cardiovascular: Normal heart size. Thoracic aortic vascular calcifications. No pericardial effusion. Mediastinum/Nodes: No enlarged axillary, mediastinal or hilar lymphadenopathy. Small hiatal hernia. Normal appearance of the esophagus. Lungs/Pleura: Central airways are patent. Centrilobular and paraseptal emphysematous change. Similar to minimal increase in size of spiculated consolidation posterior left upper lobe measuring 2.2 x 3.1 cm (image 58; series 4), previously 2.6 x 2.1 cm. There is a 5 mm right upper lobe nodule (image 41; series 4)., similar to prior. Similar 7 mm right upper lobe nodular opacity (image 28; series 4). Interval development of patchy nodular opacities within the subpleural medial left upper lobe (image 29; series 4) measuring 1.3 x 0.8 cm. Stable 6 mm left upper lobe pulmonary nodule (image 49; series 4). No pleural effusion or pneumothorax. Musculoskeletal: Prior median sternotomy. Thoracic spine degenerative changes. No aggressive or acute appearing osseous lesions. CT ABDOMEN PELVIS FINDINGS  Hepatobiliary: Liver is normal in size and contour. Stable too small to characterize subcentimeter low-attenuation hepatic lesions. Gallbladder is unremarkable. Similar 2.4 x 2.4 cm low-attenuation lesion porta hepatis (image 62; series 2), potentially choledochal cyst. Pancreas: Unremarkable Spleen: Unremarkable Adrenals/Urinary Tract: Stable appearance of the adrenal glands. Kidneys enhance symmetrically with contrast. Stable 1.5 cm exophytic hyperdense lesion off the superior pole of the left kidney (image 59; series 2). Stable exophytic left renal cyst measuring 2.8 cm (image 66; series 2). Unchanged subcentimeter low-attenuation lesion inferior pole right kidney. Urinary bladder is unremarkable. Stomach/Bowel: Oral contrast of the level of the rectum. Normal appendix. No abnormal bowel wall thickening or evidence for bowel obstruction. No free fluid or free intraperitoneal  air. Normal morphology of the stomach. Vascular/Lymphatic: Normal caliber abdominal aorta. Peripheral calcified atherosclerotic plaque. No retroperitoneal lymphadenopathy. Reproductive: Uterus and adnexal structures are unremarkable. Other: None. Musculoskeletal: Lumbar spine degenerative changes. No aggressive or acute appearing osseous lesions. IMPRESSION: 1. Similar to minimal increase in size of the spiculated consolidation within the posterior left upper lobe. 2. Interval development of subpleural ground-glass and consolidative opacities within the medial left upper lobe, nonspecific in etiology. Recommend attention on follow-up. 3. Stable indeterminate hyperdense lesion off the superior pole of the left kidney. Recommend continued attention on follow-up. 4. Stable low-attenuation lesion within the porta hepatis, potentially choledochal cyst. Recommend attention on follow-up. 5. Aortic atherosclerosis. 6. Emphysema. Electronically Signed   By: Lovey Newcomer M.D.   On: 08/28/2022 10:13   CT Abdomen Pelvis W Contrast  Result Date:  08/28/2022 CLINICAL DATA:  History of non-small cell lung cancer. Follow-up exam. * Tracking Code: BO * EXAM: CT CHEST, ABDOMEN, AND PELVIS WITH CONTRAST TECHNIQUE: Multidetector CT imaging of the chest, abdomen and pelvis was performed following the standard protocol during bolus administration of intravenous contrast. RADIATION DOSE REDUCTION: This exam was performed according to the departmental dose-optimization program which includes automated exposure control, adjustment of the mA and/or kV according to patient size and/or use of iterative reconstruction technique. CONTRAST:  126m OMNIPAQUE IOHEXOL 300 MG/ML  SOLN COMPARISON:  CT C AP June 25, 2022 FINDINGS: CT CHEST FINDINGS Cardiovascular: Normal heart size. Thoracic aortic vascular calcifications. No pericardial effusion. Mediastinum/Nodes: No enlarged axillary, mediastinal or hilar lymphadenopathy. Small hiatal hernia. Normal appearance of the esophagus. Lungs/Pleura: Central airways are patent. Centrilobular and paraseptal emphysematous change. Similar to minimal increase in size of spiculated consolidation posterior left upper lobe measuring 2.2 x 3.1 cm (image 58; series 4), previously 2.6 x 2.1 cm. There is a 5 mm right upper lobe nodule (image 41; series 4)., similar to prior. Similar 7 mm right upper lobe nodular opacity (image 28; series 4). Interval development of patchy nodular opacities within the subpleural medial left upper lobe (image 29; series 4) measuring 1.3 x 0.8 cm. Stable 6 mm left upper lobe pulmonary nodule (image 49; series 4). No pleural effusion or pneumothorax. Musculoskeletal: Prior median sternotomy. Thoracic spine degenerative changes. No aggressive or acute appearing osseous lesions. CT ABDOMEN PELVIS FINDINGS Hepatobiliary: Liver is normal in size and contour. Stable too small to characterize subcentimeter low-attenuation hepatic lesions. Gallbladder is unremarkable. Similar 2.4 x 2.4 cm low-attenuation lesion porta  hepatis (image 62; series 2), potentially choledochal cyst. Pancreas: Unremarkable Spleen: Unremarkable Adrenals/Urinary Tract: Stable appearance of the adrenal glands. Kidneys enhance symmetrically with contrast. Stable 1.5 cm exophytic hyperdense lesion off the superior pole of the left kidney (image 59; series 2). Stable exophytic left renal cyst measuring 2.8 cm (image 66; series 2). Unchanged subcentimeter low-attenuation lesion inferior pole right kidney. Urinary bladder is unremarkable. Stomach/Bowel: Oral contrast of the level of the rectum. Normal appendix. No abnormal bowel wall thickening or evidence for bowel obstruction. No free fluid or free intraperitoneal air. Normal morphology of the stomach. Vascular/Lymphatic: Normal caliber abdominal aorta. Peripheral calcified atherosclerotic plaque. No retroperitoneal lymphadenopathy. Reproductive: Uterus and adnexal structures are unremarkable. Other: None. Musculoskeletal: Lumbar spine degenerative changes. No aggressive or acute appearing osseous lesions. IMPRESSION: 1. Similar to minimal increase in size of the spiculated consolidation within the posterior left upper lobe. 2. Interval development of subpleural ground-glass and consolidative opacities within the medial left upper lobe, nonspecific in etiology. Recommend attention on follow-up. 3. Stable indeterminate hyperdense  lesion off the superior pole of the left kidney. Recommend continued attention on follow-up. 4. Stable low-attenuation lesion within the porta hepatis, potentially choledochal cyst. Recommend attention on follow-up. 5. Aortic atherosclerosis. 6. Emphysema. Electronically Signed   By: Lovey Newcomer M.D.   On: 08/28/2022 10:13    ASSESSMENT AND PLAN: This is a very pleasant 71 years old white female diagnosed with a stage IV (T2 a, N2, M1 B) non-small cell lung cancer, adenocarcinoma presented with left upper lobe lung mass in addition to left hilar and precarinal metastatic adenopathy  as well as small left lower lobe pulmonary nodule and solitary left frontal brain metastasis diagnosed in July 2022.  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 The patient is status post SRS to the solitary brain metastasis. She underwent a course of concurrent chemoradiation to the locally advanced disease in the chest with carboplatin for AUC of 2 and paclitaxel 45 Mg/M2 status post 7 cycles.  The patient has been tolerating her treatment well with no concerning adverse effects. Her scan showed mild improvement of her disease with no concerning findings for progression. Technically the patient has a stage IV lung cancer with brain metastasis  The patient is currently undergoing systemic chemotherapy with carboplatin for AUC of 5, Alimta 500 Mg/M2 and Keytruda 200 Mg IV every 3 weeks status post 17 cycles.  Starting from cycle #5 the patient will be on maintenance treatment with Alimta and Keytruda every 3 weeks.   The patient has been tolerating this treatment well with no concerning adverse effects. I recommended for her to proceed with cycle #18 today as planned. For the recent bronchitis, I will start her on Augmentin 875 mg p.o. twice daily.  The patient mentions that her penicillin allergy was during childhood with rash but she took Augmentin and penicillin products as an adult with no allergy issues. I will see her back for follow-up visit in 3 weeks for evaluation before the next cycle of her treatment. She was advised to call immediately if she has any other concerning symptoms in the interval. The patient voices understanding of current disease status and treatment options and is in agreement with the current care plan. The total time spent in the appointment was 30 minutes.  All questions were answered. The patient knows to call the clinic with any problems, questions or concerns. We can certainly see the patient much sooner if  necessary.  Disclaimer: This note was dictated with voice recognition software. Similar sounding words can inadvertently be transcribed and may not be corrected upon review.

## 2022-09-21 ENCOUNTER — Other Ambulatory Visit: Payer: Self-pay

## 2022-09-23 ENCOUNTER — Other Ambulatory Visit: Payer: Self-pay

## 2022-09-29 ENCOUNTER — Other Ambulatory Visit: Payer: Self-pay

## 2022-09-30 ENCOUNTER — Other Ambulatory Visit: Payer: Self-pay

## 2022-10-03 ENCOUNTER — Other Ambulatory Visit: Payer: Self-pay | Admitting: Radiology

## 2022-10-03 DIAGNOSIS — C3412 Malignant neoplasm of upper lobe, left bronchus or lung: Secondary | ICD-10-CM

## 2022-10-04 ENCOUNTER — Other Ambulatory Visit: Payer: Self-pay | Admitting: Internal Medicine

## 2022-10-04 ENCOUNTER — Encounter (HOSPITAL_COMMUNITY): Payer: Self-pay

## 2022-10-04 ENCOUNTER — Ambulatory Visit (HOSPITAL_COMMUNITY)
Admission: RE | Admit: 2022-10-04 | Discharge: 2022-10-04 | Disposition: A | Discharge status: Home / Self Care | Payer: Medicare HMO | Source: Ambulatory Visit | Attending: Internal Medicine | Admitting: Internal Medicine

## 2022-10-04 ENCOUNTER — Other Ambulatory Visit: Payer: Self-pay

## 2022-10-04 DIAGNOSIS — E039 Hypothyroidism, unspecified: Secondary | ICD-10-CM | POA: Diagnosis not present

## 2022-10-04 DIAGNOSIS — F419 Anxiety disorder, unspecified: Secondary | ICD-10-CM | POA: Diagnosis not present

## 2022-10-04 DIAGNOSIS — I878 Other specified disorders of veins: Secondary | ICD-10-CM

## 2022-10-04 DIAGNOSIS — I2510 Atherosclerotic heart disease of native coronary artery without angina pectoris: Secondary | ICD-10-CM | POA: Diagnosis not present

## 2022-10-04 DIAGNOSIS — J449 Chronic obstructive pulmonary disease, unspecified: Secondary | ICD-10-CM | POA: Diagnosis not present

## 2022-10-04 DIAGNOSIS — Z951 Presence of aortocoronary bypass graft: Secondary | ICD-10-CM | POA: Insufficient documentation

## 2022-10-04 DIAGNOSIS — I10 Essential (primary) hypertension: Secondary | ICD-10-CM | POA: Diagnosis not present

## 2022-10-04 DIAGNOSIS — Z452 Encounter for adjustment and management of vascular access device: Secondary | ICD-10-CM | POA: Diagnosis not present

## 2022-10-04 DIAGNOSIS — C3490 Malignant neoplasm of unspecified part of unspecified bronchus or lung: Secondary | ICD-10-CM | POA: Diagnosis not present

## 2022-10-04 DIAGNOSIS — E785 Hyperlipidemia, unspecified: Secondary | ICD-10-CM | POA: Insufficient documentation

## 2022-10-04 DIAGNOSIS — I252 Old myocardial infarction: Secondary | ICD-10-CM | POA: Diagnosis not present

## 2022-10-04 DIAGNOSIS — F32A Depression, unspecified: Secondary | ICD-10-CM | POA: Diagnosis not present

## 2022-10-04 DIAGNOSIS — F17210 Nicotine dependence, cigarettes, uncomplicated: Secondary | ICD-10-CM | POA: Diagnosis not present

## 2022-10-04 DIAGNOSIS — C7931 Secondary malignant neoplasm of brain: Secondary | ICD-10-CM | POA: Insufficient documentation

## 2022-10-04 DIAGNOSIS — K219 Gastro-esophageal reflux disease without esophagitis: Secondary | ICD-10-CM | POA: Diagnosis not present

## 2022-10-04 DIAGNOSIS — R69 Illness, unspecified: Secondary | ICD-10-CM | POA: Diagnosis not present

## 2022-10-04 DIAGNOSIS — C3412 Malignant neoplasm of upper lobe, left bronchus or lung: Secondary | ICD-10-CM

## 2022-10-04 HISTORY — PX: IR IMAGING GUIDED PORT INSERTION: IMG5740

## 2022-10-04 MED ORDER — FENTANYL CITRATE (PF) 100 MCG/2ML IJ SOLN
INTRAMUSCULAR | Status: DC | PRN
  Administered 2022-10-04 (×2): 25 ug via INTRAVENOUS
  Administered 2022-10-04: 50 ug via INTRAVENOUS

## 2022-10-04 MED ORDER — SODIUM CHLORIDE 0.9 % IV SOLN: INTRAVENOUS | Status: DC

## 2022-10-04 MED ORDER — LIDOCAINE-EPINEPHRINE 1 %-1:100000 IJ SOLN
INTRAMUSCULAR | Status: DC | PRN
  Administered 2022-10-04: 10 mL

## 2022-10-04 MED ORDER — MIDAZOLAM HCL 2 MG/2ML IJ SOLN
INTRAMUSCULAR | Status: DC | PRN
  Administered 2022-10-04: 1 mg via INTRAVENOUS
  Administered 2022-10-04 (×2): .5 mg via INTRAVENOUS

## 2022-10-04 MED ORDER — MIDAZOLAM HCL 2 MG/2ML IJ SOLN
INTRAMUSCULAR | Status: AC
  Filled 2022-10-04: qty 2

## 2022-10-04 MED ORDER — FENTANYL CITRATE (PF) 100 MCG/2ML IJ SOLN
INTRAMUSCULAR | Status: AC
  Filled 2022-10-04: qty 2

## 2022-10-04 MED ORDER — LIDOCAINE-EPINEPHRINE 1 %-1:100000 IJ SOLN
INTRAMUSCULAR | Status: AC
  Filled 2022-10-04: qty 1

## 2022-10-04 MED ORDER — HEPARIN SOD (PORK) LOCK FLUSH 100 UNIT/ML IV SOLN
INTRAVENOUS | Status: AC
  Administered 2022-10-04: 5 [IU]
  Filled 2022-10-04: qty 5

## 2022-10-04 NOTE — Procedures (Signed)
Interventional Radiology Procedure Note  Procedure: RT IJ POWER PORT    Complications: None  Estimated Blood Loss:  MIN  Findings: TIP SVCRA    M. TREVOR Quisha Mabie, MD    

## 2022-10-04 NOTE — Discharge Instructions (Signed)
Moderate Conscious Sedation, Adult, Care After This sheet gives you information about how to care for yourself after your procedure. Your health care provider may also give you more specific instructions. If you have problems or questions, contact your health care provider. What can I expect after the procedure? After the procedure, it is common to have: Sleepiness for several hours. Impaired judgment for several hours. Difficulty with balance. Vomiting if you eat too soon. Follow these instructions at home: For the time period you were told by your health care provider:     Rest. Do not participate in activities where you could fall or become injured. Do not drive or use machinery. Do not drink alcohol. Do not take sleeping pills or medicines that cause drowsiness. Do not make important decisions or sign legal documents. Do not take care of children on your own. Eating and drinking  Follow the diet recommended by your health care provider. Drink enough fluid to keep your urine pale yellow. If you vomit: Drink water, juice, or soup when you can drink without vomiting. Make sure you have little or no nausea before eating solid foods. General instructions Take over-the-counter and prescription medicines only as told by your health care provider. Have a responsible adult stay with you for the time you are told. It is important to have someone help care for you until you are awake and alert. Do not smoke. Keep all follow-up visits as told by your health care provider. This is important. Contact a health care provider if: You are still sleepy or having trouble with balance after 24 hours. You feel light-headed. You keep feeling nauseous or you keep vomiting. You develop a rash. You have a fever. You have redness or swelling around the IV site. Get help right away if: You have trouble breathing. You have new-onset confusion at home. Summary After the procedure, it is common to  feel sleepy, have impaired judgment, or feel nauseous if you eat too soon. Rest after you get home. Know the things you should not do after the procedure. Follow the diet recommended by your health care provider and drink enough fluid to keep your urine pale yellow. Get help right away if you have trouble breathing or new-onset confusion at home. This information is not intended to replace advice given to you by your health care provider. Make sure you discuss any questions you have with your health care provider. Document Revised: 02/20/2020 Document Reviewed: 09/18/2019 Elsevier Patient Education  [place] Insertion, Care After The following information offers guidance on how to care for yourself after your procedure. Your health care provider may also give you more specific instructions. If you have problems or questions, contact your health care provider.  Urgent needs- Interventional Radiology clinic- [phone] Wound- May remove dressing in 24-48 hours and shower. Otherwise keep site clean and dry. May replace dressing with clean bandaids as needed.  Your Provider should set up monthly appointments for Port flush.  What can I expect after the procedure? After the procedure, it is common to have: Discomfort at the port insertion site. Bruising on the skin over the port. This should improve over 3-4 days. Follow these instructions at home: [facility] care After your port is placed, you will get a manufacturer's information card. The card has information about your port. Keep this card with you at all times. Take care of the port as told by your health care provider. Ask your health  care provider if you or a family member can get training for taking care of the port at home. A home health care nurse will be be available to help care for the port. Make sure to remember what type of port you have. Incision care     Follow instructions from your health care  provider about how to take care of your port insertion site. Make sure you: Wash your hands with soap and water for at least 20 seconds before and after you change your bandage (dressing). If soap and water are not available, use hand sanitizer. Change your dressing as told by your health care provider. Leave stitches (sutures), skin glue, or adhesive strips in place. These skin closures may need to stay in place for 2 weeks or longer. If adhesive strip edges start to loosen and curl up, you may trim the loose edges. Do not remove adhesive strips completely unless your health care provider tells you to do that. Check your port insertion site every day for signs of infection. Check for: Redness, swelling, or pain. Fluid or blood. Warmth. Pus or a bad smell. Activity Return to your normal activities as told by your health care provider. Ask your health care provider what activities are safe for you. You may have to avoid lifting. Ask your health care provider how much you can safely lift. General instructions Take over-the-counter and prescription medicines only as told by your health care provider. Do not take baths, swim, or use a hot tub until site healed. Ask your health care provider if you may take showers. You may only be allowed to take sponge baths. If you were given a sedative during the procedure, it can affect you for several hours. Do not drive or operate machinery until your health care provider says that it is safe. Wear a medical alert bracelet in case of an emergency. This will tell any health care providers that you have a port. Keep all follow-up visits. This is important. Contact a health care provider if: You cannot flush your port with saline as directed, or you cannot draw blood from the port. You have a fever or chills. You have redness, swelling, or pain around your port insertion site. You have fluid or blood coming from your port insertion site. Your port insertion  site feels warm to the touch. You have pus or a bad smell coming from the port insertion site. Get help right away if: You have chest pain or shortness of breath. You have bleeding from your port that you cannot control. These symptoms may be an emergency. Get help right away. Call 911. Do not wait to see if the symptoms will go away. Do not drive yourself to the hospital. Summary Take care of the port as told by your health care provider. Keep the manufacturer's information card with you at all times. Change your dressing as told by your health care provider. Contact a health care provider if you have a fever or chills or if you have redness, swelling, or pain around your port insertion site. Keep all follow-up visits. This information is not intended to replace advice given to you by your health care provider. Make sure you discuss any questions you have with your health care provider. Document Revised: 04/26/2021 Document Reviewed: 04/26/2021 Elsevier Patient Education  [place].

## 2022-10-04 NOTE — H&P (Signed)
Referring Physician(s): Mohamed,Mohamed  Supervising Physician: Daryll Brod  Patient Status:  [facility] OP  Chief Complaint:  "I'm here for a port a cath"  Subjective: Patient known to interventional/neurointerventional radiology service from basilar apex aneurysm stent assisted coiling in 2012.  She is a 71 year old female with stage IV non-small cell lung cancer, adenocarcinoma who presented with left upper lobe lung mass in addition to left hilar and precarinal metastatic adenopathy as well as small left lower lobe pulmonary nodule and solitary left frontal brain metastasis in July 2022.  She has poor venous access and presents today for Port-A-Cath placement to assist with additional treatment. She continues to smoke, denies fever, CP,abd/back pain,N/V or bleeding. She does have HA, chronic dyspnea, cough. Additional med hx.   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 EF 70%; fixed defect involving septum, inferoseptal and inferior wall   Pain, dental 02/06/2017   Poor dental hygiene    Retroperitoneal bleeding    Following cardiac cath   Tobacco abuse    Urine discoloration 09/18/2013   Urine  incontinence 07/06/2020   UTI (lower urinary tract infection) 10/16/2013   Past Surgical History:  Procedure Laterality Date   CARDIAC CATHETERIZATION  01/02/2007   IT REVEALS MILD INFERIOR WALL HYPOKINESIS. THE EJECTION FRACTION IS AROUND 50%   COLONOSCOPY     CORONARY ARTERY BYPASS GRAFT  11/06/2006   LIMA to LAD, SVG to DX, SVG to LCX & SVG to OM 1 & 2, and SVG to PD   CORONARY STENT PLACEMENT     Remote past stent to RCA   EYE SURGERY Right    cataracts removed   HEMORRHOID SURGERY  11/07/1987   UPPER GI ENDOSCOPY     VENTRICULOSTOMY  10/06/2011   Procedure: VENTRICULOSTOMY;  Surgeon: Winfield Cunas;  Location: [place] [facility];  Service: Neurosurgery;  Laterality: Right;  Insertion of Ventriculostomy Catheter   VIDEO BRONCHOSCOPY WITH ENDOBRONCHIAL NAVIGATION Left 05/20/2021   Procedure: VIDEO BRONCHOSCOPY WITH ENDOBRONCHIAL NAVIGATION;  Surgeon: Garner Nash, DO;  Location: [facility];  Service: Pulmonary;  Laterality: Left;   VIDEO BRONCHOSCOPY WITH ENDOBRONCHIAL ULTRASOUND Bilateral 05/20/2021   Procedure: VIDEO BRONCHOSCOPY WITH ENDOBRONCHIAL ULTRASOUND;  Surgeon: Garner Nash, DO;  Location: [place];  Service: Pulmonary;  Laterality: Bilateral;   WISDOM TOOTH EXTRACTION        Allergies: Varenicline, Ace inhibitors, Prednisone, Betalin 12 [vitamin b12], Penicillins, and Sulfa drugs cross reactors  Medications: Prior to  Admission medications   Medication Sig Start Date End Date Taking? Authorizing Provider  acetaminophen (TYLENOL) 500 MG tablet Take 500 mg by mouth every 6 (six) hours as needed.   Yes [provider]  amLODipine (NORVASC) 10 MG tablet Take 1 tablet by mouth once daily 05/22/22  Yes Andrena Mews T, MD  aspirin EC 81 MG tablet Take 1 tablet (81 mg total) by mouth daily. 07/06/20  Yes [name], MD  atorvastatin (LIPITOR) 40 MG tablet Take 1 tablet by mouth once daily 08/28/22  Yes Eniola, Kehinde T, MD  Cholecalciferol (VITAMIN D3) 250 MCG  (10000 UT) capsule Take 10,000 mcg by mouth daily.   Yes [provider]  diphenhydrAMINE HCl (BENADRYL ALLERGY PO) Take 25 mg by mouth at bedtime as needed (for sleep).   Yes [provider]  escitalopram (LEXAPRO) 10 MG tablet Take 1 tablet by mouth once daily 05/15/22  Yes [name], MD  folic acid (FOLVITE) 1 MG tablet Take 1 tablet by mouth once daily 09/06/22  Yes Curt Bears, MD  levothyroxine (SYNTHROID) 137 MCG tablet TAKE 1 TABLET BY MOUTH ONCE DAILY BEFORE BREAKFAST 08/01/22  Yes [name], MD  losartan (COZAAR) 100 MG tablet Take 1 tablet by mouth once daily 05/19/22  Yes Eniola, Kehinde T, MD  metoprolol succinate (TOPROL-XL) 50 MG 24 hr tablet TAKE 1 TABLET BY MOUTH ONCE DAILY WITH A MEAL OR  IMMEDIATELY  FOLLOWING 05/31/22  Yes [name], MD  Multiple Vitamins-Minerals (MULTIVITAMIN WITH MINERALS) tablet Take 1 tablet by mouth daily.   Yes [provider]  Vitamin A 2400 MCG (8000 UT) TABS Take 2,400 mg by mouth daily.   Yes [provider]  amoxicillin-clavulanate (AUGMENTIN) 875-125 MG tablet Take 1 tablet by mouth 2 (two) times daily. 09/20/22   Curt Bears, MD  nitroGLYCERIN (NITROSTAT) 0.4 MG SL tablet Place 1 tablet (0.4 mg total) under the tongue every 5 (five) minutes as needed. For chest pain Patient not taking: Reported on 10/26/2021 05/22/13   Nahser, Wonda Cheng, MD     Vital Signs: BP 137/80   Pulse 87   Temp 98.9 F (37.2 C) (Oral)   Resp 16   Ht 5\' 7"  (1.702 m)   Wt 141 lb 1.5 oz (64 kg)   SpO2 96%   BMI 22.10 kg/m   Physical Exam awake/alert; chest- distant BS with few exp wheezes; heart- RRR; abd- soft,+BS,NT; no LE edema  Imaging: No results found.  Labs:  CBC: Recent Labs    07/19/22 0941 08/09/22 0936 08/30/22 1104 09/20/22 0852  WBC 5.7 4.7 6.1 6.9  HGB 12.3 11.7* 11.7* 10.8*  HCT 37.6 35.4* 35.5* 31.7*  PLT 333 335 308 366    COAGS: No results for input(s): "INR", "APTT"  in the last 8760 hours.  BMP: Recent Labs    07/19/22 0941 08/09/22 0936 08/30/22 1104 09/20/22 0852  NA 141 142 140 138  K 4.1 3.9 3.7 3.7  CL 105 105 104 101  CO2 32 33* 29 30  GLUCOSE 114* 111* 135* 113*  BUN 15 12 10 18   CALCIUM 9.6 9.2 9.1 9.3  CREATININE 1.05* 1.09* 1.04* 0.90  GFRNONAA 56* 54* 57* >60    LIVER FUNCTION TESTS: Recent Labs    07/19/22 0941 08/09/22 0936 08/30/22 1104 09/20/22 0852  BILITOT 0.3 0.2* 0.3 0.2*  AST 35 25 23 24   ALT 26 14 14 15   ALKPHOS 73 77 78 84  PROT 7.1 6.6  6.9 7.3  ALBUMIN 4.0 3.8 3.7 3.6    Assessment and Plan: Patient known to interventional/neurointerventional radiology service from basilar apex aneurysm stent assisted coiling in 2012.  She is a 71 year old female with stage IV non-small cell lung cancer, adenocarcinoma who presented with left upper lobe lung mass in addition to left hilar and precarinal metastatic adenopathy as well as small left lower lobe pulmonary nodule and solitary left frontal brain metastasis in July 2022.  She has poor venous access and presents today for Port-A-Cath placement to assist with additional treatment. PMH also significant for anxiety, depression, GERD, hyperlipidemia, hypertension, hypothyroidism, COPD, coronary artery disease with prior MI, CABG.    Risks and benefits of image guided port-a-catheter placement was discussed with the patient including, but not limited to bleeding, infection, pneumothorax, or fibrin sheath development and need for additional procedures.  All of the patient's questions were answered, patient is agreeable to proceed. Consent signed and in chart.    Electronically Signed: D. Rowe Robert, PA-C 10/04/2022, 1:22 PM   I spent a total of 25 minutes at the the patient's bedside AND on the patient's hospital floor or unit, greater than 50% of which was counseling/coordinating care for port a cath placement

## 2022-10-07 ENCOUNTER — Other Ambulatory Visit: Payer: Self-pay

## 2022-10-09 NOTE — Progress Notes (Unsigned)
[facility] OFFICE PROGRESS NOTE  [name], MD [place] [address]  DIAGNOSIS: Stage IV (T2 a, N2, M1b) non-small cell lung cancer, adenocarcinoma presented with left upper lobe lung mass in addition to left hilar and precarinal metastatic adenopathy and small left lower lobe pulmonary nodule in addition to solitary left frontal brain metastasis diagnosed in July 2022.   Molecular studies by [facility] showed  Positive KRAS G12C mutation PD-L1 expression was less than 1%  PRIOR THERAPY: 1) Status post SRS to the solitary brain metastasis under the care of Dr. Tammi Klippel. Completed on 06/23/21 2)  Treatment for the locally advanced disease in the chest with carboplatin for AUC of 2 and paclitaxel 45 Mg/M2.  Status post 6 cycles.  Last dose was given 07/25/2021. 3) SRS to the additional metastatic brain lesions on 09/28/22  CURRENT THERAPY: Systemic chemotherapy with carboplatin for AUC of 5, Alimta 500 Mg/M2 and Keytruda 200 Mg IV every 3 weeks.  First dose August 31, 2021. Status post 18 cycles. Starting from cycle #5, she will start Alimta and keytruda maintenance IV every 3 weeks.    INTERVAL HISTORY: PEGGI YONO 71 y.o. female returns to the clinic today for a follow-up visit.  The patient is feeling fairly well today without any concerning complaints.  The patient is currently undergoing maintenance chemotherapy and immunotherapy. She is tolerating it fairly well besides fatigue. She denies any fever or chills. Denies any recent night sweats. She follows closely with radiation oncology for her history of metastatic disease to the brain. She is scheduled for a brain MRI on 10/12/22. She reports her baseline dyspnea on exertion and chronic cough, which is stable. Dr. Julien Nordmann gave her Augmentin at her last appointment which *** her cough. She still smokes 1 ppd of cigarettes.  She denies any chest pain or hemoptysis.  Denies any nausea, vomiting,  or diarrhea. She may intermittently have mild constipation which she manages with OTC medications. Rashes/Itching. She recently had a port-a-cath placed and tolerated it ***. She is here for evaluation and repeat blood work before starting cycle #19.   Mm gave augmentin fr cough. Also had port.   #19. Send EMLA cream.... last scan 10/21 order....   MEDICAL HISTORY: Past Medical History:  Diagnosis Date   Anxiety    ON PAXIL, XANAX   AVM (arteriovenous malformation) brain    s/p stent/coil   Blood transfusion    x 2   Cancer (HCC)    Left lung   COPD (chronic obstructive pulmonary disease) (HCC)    no inhaler, no oxygen   Coronary artery disease    Prior inferior MI with stent to RCA, s/p CABG in 2008   Depression    Dizziness    Dyspnea    with exertion, no oxygen   Fatigue    Foot injury 06/01/2017   right - RESOLVED, no longer an issue per patient 05/18/21   GERD (gastroesophageal reflux disease)    Headache(784.0)    UNRUPTURED CEREBRAL ANEURYSM   Hyperlipidemia    Hypertension    Hypothyroidism    Infected cyst of skin 09/18/2013   Left knee pain 06/05/2012   Lung mass    Left lung   Myocardial infarction ([place])    Normal nuclear stress test Ju;y 2012   No ischemia. EF 70%; fixed defect involving septum, inferoseptal and inferior wall   Pain, dental 02/06/2017   Poor dental hygiene    Retroperitoneal bleeding  Following cardiac cath   Tobacco abuse    Urine discoloration 09/18/2013   Urine incontinence 07/06/2020   UTI (lower urinary tract infection) 10/16/2013    ALLERGIES:  is allergic to varenicline, ace inhibitors, prednisone, betalin 12 [vitamin b12], penicillins, and sulfa drugs cross reactors.  MEDICATIONS:  Current Outpatient Medications  Medication Sig Dispense Refill   acetaminophen (TYLENOL) 500 MG tablet Take 500 mg by mouth every 6 (six) hours as needed.     amLODipine (NORVASC) 10 MG tablet Take 1 tablet by mouth once daily 90 tablet 1    amoxicillin-clavulanate (AUGMENTIN) 875-125 MG tablet Take 1 tablet by mouth 2 (two) times daily. 14 tablet 0   aspirin EC 81 MG tablet Take 1 tablet (81 mg total) by mouth daily. 90 tablet 2   atorvastatin (LIPITOR) 40 MG tablet Take 1 tablet by mouth once daily 90 tablet 1   Cholecalciferol (VITAMIN D3) 250 MCG (10000 UT) capsule Take 10,000 mcg by mouth daily.     diphenhydrAMINE HCl (BENADRYL ALLERGY PO) Take 25 mg by mouth at bedtime as needed (for sleep).     escitalopram (LEXAPRO) 10 MG tablet Take 1 tablet by mouth once daily 90 tablet 1   folic acid (FOLVITE) 1 MG tablet Take 1 tablet by mouth once daily 30 tablet 0   levothyroxine (SYNTHROID) 137 MCG tablet TAKE 1 TABLET BY MOUTH ONCE DAILY BEFORE BREAKFAST 30 tablet 2   losartan (COZAAR) 100 MG tablet Take 1 tablet by mouth once daily 90 tablet 1   metoprolol succinate (TOPROL-XL) 50 MG 24 hr tablet TAKE 1 TABLET BY MOUTH ONCE DAILY WITH A MEAL OR  IMMEDIATELY  FOLLOWING 90 tablet 1   Multiple Vitamins-Minerals (MULTIVITAMIN WITH MINERALS) tablet Take 1 tablet by mouth daily.     nitroGLYCERIN (NITROSTAT) 0.4 MG SL tablet Place 1 tablet (0.4 mg total) under the tongue every 5 (five) minutes as needed. For chest pain (Patient not taking: Reported on 10/26/2021) 25 tablet 6   Vitamin A 2400 MCG (8000 UT) TABS Take 2,400 mg by mouth daily.     No current facility-administered medications for this visit.    SURGICAL HISTORY:  Past Surgical History:  Procedure Laterality Date   CARDIAC CATHETERIZATION  01/02/2007   IT REVEALS MILD INFERIOR WALL HYPOKINESIS. THE EJECTION FRACTION IS AROUND 50%   COLONOSCOPY     CORONARY ARTERY BYPASS GRAFT  11/06/2006   LIMA to LAD, SVG to DX, SVG to LCX & SVG to OM 1 & 2, and SVG to PD   CORONARY STENT PLACEMENT     Remote past stent to RCA   EYE SURGERY Right    cataracts removed   HEMORRHOID SURGERY  11/07/1987   IR IMAGING GUIDED PORT INSERTION  10/04/2022   UPPER GI ENDOSCOPY      VENTRICULOSTOMY  10/06/2011   Procedure: VENTRICULOSTOMY;  Surgeon: Winfield Cunas;  Location: [facility];  Service: Neurosurgery;  Laterality: Right;  Insertion of Ventriculostomy Catheter   VIDEO BRONCHOSCOPY WITH ENDOBRONCHIAL NAVIGATION Left 05/20/2021   Procedure: VIDEO BRONCHOSCOPY WITH ENDOBRONCHIAL NAVIGATION;  Surgeon: Garner Nash, DO;  Location: [facility];  Service: Pulmonary;  Laterality: Left;   VIDEO BRONCHOSCOPY WITH ENDOBRONCHIAL ULTRASOUND Bilateral 05/20/2021   Procedure: VIDEO BRONCHOSCOPY WITH ENDOBRONCHIAL ULTRASOUND;  Surgeon: Garner Nash, DO;  Location: [facility];  Service: Pulmonary;  Laterality: Bilateral;   WISDOM TOOTH EXTRACTION      REVIEW OF SYSTEMS:   Review of Systems  Constitutional: Negative for appetite  change, chills, fatigue, fever and unexpected weight change.  HENT:   Negative for mouth sores, nosebleeds, sore throat and trouble swallowing.   Eyes: Negative for eye problems and icterus.  Respiratory: Negative for cough, hemoptysis, shortness of breath and wheezing.   Cardiovascular: Negative for chest pain and leg swelling.  Gastrointestinal: Negative for abdominal pain, constipation, diarrhea, nausea and vomiting.  Genitourinary: Negative for bladder incontinence, difficulty urinating, dysuria, frequency and hematuria.   Musculoskeletal: Negative for back pain, gait problem, neck pain and neck stiffness.  Skin: Negative for itching and rash.  Neurological: Negative for dizziness, extremity weakness, gait problem, headaches, light-headedness and seizures.  Hematological: Negative for adenopathy. Does not bruise/bleed easily.  Psychiatric/Behavioral: Negative for confusion, depression and sleep disturbance. The patient is not nervous/anxious.     PHYSICAL EXAMINATION:  There were no vitals taken for this visit.  ECOG PERFORMANCE STATUS: {CHL ONC ECOG [id number]  Physical Exam  Constitutional: Oriented to person, place, and time and  well-developed, well-nourished, and in no distress. No distress.  HENT:  Head: Normocephalic and atraumatic.  Mouth/Throat: Oropharynx is clear and moist. No oropharyngeal exudate.  Eyes: Conjunctivae are normal. Right eye exhibits no discharge. Left eye exhibits no discharge. No scleral icterus.  Neck: Normal range of motion. Neck supple.  Cardiovascular: Normal rate, regular rhythm, normal heart sounds and intact distal pulses.   Pulmonary/Chest: Effort normal and breath sounds normal. No respiratory distress. No wheezes. No rales.  Abdominal: Soft. Bowel sounds are normal. Exhibits no distension and no mass. There is no tenderness.  Musculoskeletal: Normal range of motion. Exhibits no edema.  Lymphadenopathy:    No cervical adenopathy.  Neurological: Alert and oriented to person, place, and time. Exhibits normal muscle tone. Gait normal. Coordination normal.  Skin: Skin is warm and dry. No rash noted. Not diaphoretic. No erythema. No pallor.  Psychiatric: Mood, memory and judgment normal.  Vitals reviewed.  LABORATORY DATA: Lab Results  Component Value Date   WBC 6.9 09/20/2022   HGB 10.8 (L) 09/20/2022   HCT 31.7 (L) 09/20/2022   MCV 102.6 (H) 09/20/2022   PLT 366 09/20/2022      Chemistry      Component Value Date/Time   NA 138 09/20/2022 0852   NA 141 04/12/2021 1356   K 3.7 09/20/2022 0852   CL 101 09/20/2022 0852   CO2 30 09/20/2022 0852   BUN 18 09/20/2022 0852   BUN 13 04/12/2021 1356   CREATININE 0.90 09/20/2022 0852   CREATININE 0.99 08/29/2016 1130      Component Value Date/Time   CALCIUM 9.3 09/20/2022 0852   ALKPHOS 84 09/20/2022 0852   AST 24 09/20/2022 0852   ALT 15 09/20/2022 0852   BILITOT 0.2 (L) 09/20/2022 0852       RADIOGRAPHIC STUDIES:  IR IMAGING GUIDED PORT INSERTION  Result Date: 10/04/2022 CLINICAL DATA:  Lung cancer EXAM: RIGHT INTERNAL JUGULAR SINGLE LUMEN POWER PORT CATHETER INSERTION Date:  10/04/2022 10/04/2022 4:31 pm  Radiologist:  Jerilynn Mages. Daryll Brod, MD Guidance:  ULTRASOUND AND FLUOROSCOPIC MEDICATIONS: 1% LIDOCAINE LOCAL WITH EPINEPHRINE ANESTHESIA/SEDATION: Versed 2.0 mg IV; Fentanyl 100 mcg IV; Moderate Sedation Time:  20 minutes The patient was continuously monitored during the procedure by the interventional radiology nurse under my direct supervision. FLUOROSCOPY: 0 minutes, 47 seconds (2.0 mGy) COMPLICATIONS: None immediate. CONTRAST:  None. PROCEDURE: Informed consent was obtained from the patient following explanation of the procedure, risks, benefits and alternatives. The patient understands, agrees and consents for the procedure. All questions  were addressed. A time out was performed. Maximal barrier sterile technique utilized including caps, mask, sterile gowns, sterile gloves, large sterile drape, hand hygiene, and 2% chlorhexidine scrub. Under sterile conditions and local anesthesia, right internal jugular micropuncture venous access was performed. Access was performed with ultrasound. Images were obtained for documentation of the patent right internal jugular vein. A guide wire was inserted followed by a transitional dilator. This allowed insertion of a guide wire and catheter into the IVC. Measurements were obtained from the SVC / RA junction back to the right IJ venotomy site. In the right infraclavicular chest, a subcutaneous pocket was created over the second anterior rib. This was done under sterile conditions and local anesthesia. 1% lidocaine with epinephrine was utilized for this. A 2.5 cm incision was made in the skin. Blunt dissection was performed to create a subcutaneous pocket over the right pectoralis major muscle. The pocket was flushed with saline vigorously. There was adequate hemostasis. The port catheter was assembled and checked for leakage. The port catheter was secured in the pocket with two retention sutures. The tubing was tunneled subcutaneously to the right venotomy site and inserted into  the SVC/RA junction through a valved peel-away sheath. Position was confirmed with fluoroscopy. Images were obtained for documentation. The patient tolerated the procedure well. No immediate complications. Incisions were closed in a two layer fashion with 4 - 0 Vicryl suture. Dermabond was applied to the skin. The port catheter was accessed, blood was aspirated followed by saline and heparin flushes. Needle was removed. A dry sterile dressing was applied. IMPRESSION: Ultrasound and fluoroscopically guided right internal jugular single lumen power port catheter insertion. Tip in the SVC/RA junction. Catheter ready for use. Electronically Signed   By: Jerilynn Mages.  Shick M.D.   On: 10/04/2022 16:47     ASSESSMENT/PLAN:  This is a very pleasant 71 year old Caucasian female diagnosed with stage IV (T2 a, N2, M1 B) non-small cell lung cancer, adenocarcinoma.  She presented with a left upper lobe lung mass in addition to left hilar and precarinal metastatic adenopathy as well as a small left lower lobe pulmonary nodule and a solitary left frontal brain metastasis.  This was diagnosed in July 2022.  Her molecular studies show that she is positive for K-ras G12C mutation and her PD-L1 expression is negative.   She completed SRS to the solitary brain metastasis under the care of Dr. Tammi Klippel which was completed on 06/23/2021.  She had a repeat brain MRI performed on 09/15/2021 which shows a new 1 mm and 3 mm metastatic brain lesions.  She received SRS on 09/28/2021.   She completed concurrent chemoradiation for the locally advanced disease in the chest with carboplatin for an AUC of 2 and paclitaxel 45 mg per metered squared.  She is status post 6 cycles.  She had been tolerating treatment well without any concerning adverse side effects.   The patient is currently undergoing systemic chemotherapy with carboplatin AUC 5, Alimta 500 mg per metered squared, Keytruda 20 mg IV every 3 weeks.  She is status post 18 cycles.  She  started maintenance alimta and Keytruda starting from cycle #5.    The patient was seen with Dr. Julien Nordmann. Labs were reviewed. Recommend that she *** with cycle #19 today as scheduled.   We will see her back for a follow up visit in 3 weeks for evaluation and repeat blood work before before starting cycle #20.   I will arrange for a restaging CT scan of the chest,  abdomen, and pelvis prior to starting the next cycle of treatment.   She is scheduled for a brain MRI on 12/7. She will have this performed and follow up with *** as scheduled.   I will send a prescription for EMLA cream to the pharmacy.   The patient was advised to call immediately if she has any concerning symptoms in the interval. The patient voices understanding of current disease status and treatment options and is in agreement with the current care plan. All questions were answered. The patient knows to call the clinic with any problems, questions or concerns. We can certainly see the patient much sooner if necessary          No orders of the defined types were placed in this encounter.    I spent {CHL ONC TIME VISIT - QJJHE:[id number]} counseling the patient face to face. The total time spent in the appointment was {CHL ONC TIME VISIT - EHUDJ:[id number]}.  Shahira Fiske L Margeret Stachnik, PA-C 10/09/22

## 2022-10-11 ENCOUNTER — Inpatient Hospital Stay: Payer: Medicare HMO | Attending: Internal Medicine | Admitting: Physician Assistant

## 2022-10-11 ENCOUNTER — Inpatient Hospital Stay: Payer: Medicare HMO

## 2022-10-11 VITALS — BP 134/71 | HR 82 | Temp 97.9°F | Resp 14 | Wt 138.9 lb

## 2022-10-11 VITALS — BP 134/63 | HR 72 | Temp 98.0°F | Resp 18

## 2022-10-11 DIAGNOSIS — I700 Atherosclerosis of aorta: Secondary | ICD-10-CM | POA: Insufficient documentation

## 2022-10-11 DIAGNOSIS — M5137 Other intervertebral disc degeneration, lumbosacral region: Secondary | ICD-10-CM | POA: Diagnosis not present

## 2022-10-11 DIAGNOSIS — Z923 Personal history of irradiation: Secondary | ICD-10-CM | POA: Diagnosis not present

## 2022-10-11 DIAGNOSIS — Z9221 Personal history of antineoplastic chemotherapy: Secondary | ICD-10-CM | POA: Insufficient documentation

## 2022-10-11 DIAGNOSIS — Z880 Allergy status to penicillin: Secondary | ICD-10-CM | POA: Diagnosis not present

## 2022-10-11 DIAGNOSIS — M5136 Other intervertebral disc degeneration, lumbar region: Secondary | ICD-10-CM | POA: Diagnosis not present

## 2022-10-11 DIAGNOSIS — E785 Hyperlipidemia, unspecified: Secondary | ICD-10-CM | POA: Insufficient documentation

## 2022-10-11 DIAGNOSIS — K314 Gastric diverticulum: Secondary | ICD-10-CM | POA: Diagnosis not present

## 2022-10-11 DIAGNOSIS — F17210 Nicotine dependence, cigarettes, uncomplicated: Secondary | ICD-10-CM | POA: Diagnosis not present

## 2022-10-11 DIAGNOSIS — Z87440 Personal history of urinary (tract) infections: Secondary | ICD-10-CM | POA: Diagnosis not present

## 2022-10-11 DIAGNOSIS — Z5112 Encounter for antineoplastic immunotherapy: Secondary | ICD-10-CM

## 2022-10-11 DIAGNOSIS — E039 Hypothyroidism, unspecified: Secondary | ICD-10-CM | POA: Diagnosis not present

## 2022-10-11 DIAGNOSIS — R5383 Other fatigue: Secondary | ICD-10-CM | POA: Diagnosis not present

## 2022-10-11 DIAGNOSIS — Z882 Allergy status to sulfonamides status: Secondary | ICD-10-CM | POA: Diagnosis not present

## 2022-10-11 DIAGNOSIS — Z888 Allergy status to other drugs, medicaments and biological substances status: Secondary | ICD-10-CM | POA: Diagnosis not present

## 2022-10-11 DIAGNOSIS — Z8719 Personal history of other diseases of the digestive system: Secondary | ICD-10-CM | POA: Diagnosis not present

## 2022-10-11 DIAGNOSIS — C7931 Secondary malignant neoplasm of brain: Secondary | ICD-10-CM | POA: Insufficient documentation

## 2022-10-11 DIAGNOSIS — C3412 Malignant neoplasm of upper lobe, left bronchus or lung: Secondary | ICD-10-CM

## 2022-10-11 DIAGNOSIS — I252 Old myocardial infarction: Secondary | ICD-10-CM | POA: Insufficient documentation

## 2022-10-11 DIAGNOSIS — J449 Chronic obstructive pulmonary disease, unspecified: Secondary | ICD-10-CM | POA: Insufficient documentation

## 2022-10-11 DIAGNOSIS — Z5111 Encounter for antineoplastic chemotherapy: Secondary | ICD-10-CM

## 2022-10-11 DIAGNOSIS — C779 Secondary and unspecified malignant neoplasm of lymph node, unspecified: Secondary | ICD-10-CM | POA: Insufficient documentation

## 2022-10-11 DIAGNOSIS — Z79899 Other long term (current) drug therapy: Secondary | ICD-10-CM | POA: Diagnosis not present

## 2022-10-11 DIAGNOSIS — G9389 Other specified disorders of brain: Secondary | ICD-10-CM | POA: Diagnosis not present

## 2022-10-11 LAB — CBC WITH DIFFERENTIAL (CANCER CENTER ONLY)
Abs Immature Granulocytes: 0 10*3/uL (ref 0.00–0.07)
Basophils Absolute: 0.1 10*3/uL (ref 0.0–0.1)
Basophils Relative: 1 %
Eosinophils Absolute: 0.1 10*3/uL (ref 0.0–0.5)
Eosinophils Relative: 2 %
HCT: 35.4 % — ABNORMAL LOW (ref 36.0–46.0)
Hemoglobin: 11.6 g/dL — ABNORMAL LOW (ref 12.0–15.0)
Immature Granulocytes: 0 %
Lymphocytes Relative: 20 %
Lymphs Abs: 1.1 10*3/uL (ref 0.7–4.0)
MCH: 34.8 pg — ABNORMAL HIGH (ref 26.0–34.0)
MCHC: 32.8 g/dL (ref 30.0–36.0)
MCV: 106.3 fL — ABNORMAL HIGH (ref 80.0–100.0)
Monocytes Absolute: 0.6 10*3/uL (ref 0.1–1.0)
Monocytes Relative: 11 %
Neutro Abs: 3.6 10*3/uL (ref 1.7–7.7)
Neutrophils Relative %: 66 %
Platelet Count: 303 10*3/uL (ref 150–400)
RBC: 3.33 MIL/uL — ABNORMAL LOW (ref 3.87–5.11)
RDW: 15.8 % — ABNORMAL HIGH (ref 11.5–15.5)
WBC Count: 5.5 10*3/uL (ref 4.0–10.5)
nRBC: 0 % (ref 0.0–0.2)

## 2022-10-11 LAB — CMP (CANCER CENTER ONLY)
ALT: 23 U/L (ref 0–44)
AST: 33 U/L (ref 15–41)
Albumin: 3.7 g/dL (ref 3.5–5.0)
Alkaline Phosphatase: 79 U/L (ref 38–126)
Anion gap: 5 (ref 5–15)
BUN: 22 mg/dL (ref 8–23)
CO2: 31 mmol/L (ref 22–32)
Calcium: 9.7 mg/dL (ref 8.9–10.3)
Chloride: 103 mmol/L (ref 98–111)
Creatinine: 0.92 mg/dL (ref 0.44–1.00)
GFR, Estimated: >60 mL/min (ref >60)
Glucose, Bld: 125 mg/dL — ABNORMAL HIGH (ref 70–99)
Potassium: 3.8 mmol/L (ref 3.5–5.1)
Sodium: 139 mmol/L (ref 135–145)
Total Bilirubin: 0.3 mg/dL (ref 0.3–1.2)
Total Protein: 7.1 g/dL (ref 6.5–8.1)

## 2022-10-11 MED ORDER — SODIUM CHLORIDE 0.9 % IV SOLN: Freq: Once | INTRAVENOUS | Status: AC

## 2022-10-11 MED ORDER — LIDOCAINE-PRILOCAINE 2.5-2.5 % EX CREA: 1.0000 | TOPICAL_CREAM | CUTANEOUS | 2 refills | Status: AC | PRN

## 2022-10-11 MED ORDER — PROCHLORPERAZINE MALEATE 10 MG PO TABS
10.0000 mg | ORAL_TABLET | Freq: Once | ORAL | Status: AC
  Administered 2022-10-11: 10 mg via ORAL
  Filled 2022-10-11: qty 1

## 2022-10-11 MED ORDER — SODIUM CHLORIDE 0.9% FLUSH
10.0000 mL | INTRAVENOUS | Status: DC | PRN
  Administered 2022-10-11: 10 mL

## 2022-10-11 MED ORDER — AZITHROMYCIN 250 MG PO TABS: ORAL_TABLET | ORAL | 0 refills | Status: DC

## 2022-10-11 MED ORDER — HEPARIN SOD (PORK) LOCK FLUSH 100 UNIT/ML IV SOLN
500.0000 [IU] | Freq: Once | INTRAVENOUS | Status: AC | PRN
  Administered 2022-10-11: 500 [IU]

## 2022-10-11 MED ORDER — SODIUM CHLORIDE 0.9 % IV SOLN
200.0000 mg | Freq: Once | INTRAVENOUS | Status: AC
  Administered 2022-10-11: 200 mg via INTRAVENOUS
  Filled 2022-10-11: qty 200

## 2022-10-11 MED ORDER — SODIUM CHLORIDE 0.9 % IV SOLN
500.0000 mg/m2 | Freq: Once | INTRAVENOUS | Status: AC
  Administered 2022-10-11: 900 mg via INTRAVENOUS
  Filled 2022-10-11: qty 20

## 2022-10-11 NOTE — Patient Instructions (Signed)
[facility] ONCOLOGY  Discharge Instructions: Thank you for choosing [facility] to provide your oncology and hematology care.   If you have a lab appointment with the [facility], please go directly to the [facility] and check in at the registration area.   Wear comfortable clothing and clothing appropriate for easy access to any Portacath or PICC line.   We strive to give you quality time with your provider. You may need to reschedule your appointment if you arrive late (15 or more minutes).  Arriving late affects you and other patients whose appointments are after yours.  Also, if you miss three or more appointments without notifying the office, you may be dismissed from the clinic at the provider's discretion.      For prescription refill requests, have your pharmacy contact our office and allow 72 hours for refills to be completed.    Today you received the following chemotherapy and/or immunotherapy agents: Keytruda/Alimta     To help prevent nausea and vomiting after your treatment, we encourage you to take your nausea medication as directed.  BELOW ARE SYMPTOMS THAT SHOULD BE REPORTED IMMEDIATELY: *FEVER GREATER THAN 100.4 F (38 C) OR HIGHER *CHILLS OR SWEATING *NAUSEA AND VOMITING THAT IS NOT CONTROLLED WITH YOUR NAUSEA MEDICATION *UNUSUAL SHORTNESS OF BREATH *UNUSUAL BRUISING OR BLEEDING *URINARY PROBLEMS (pain or burning when urinating, or frequent urination) *BOWEL PROBLEMS (unusual diarrhea, constipation, pain near the anus) TENDERNESS IN MOUTH AND THROAT WITH OR WITHOUT PRESENCE OF ULCERS (sore throat, sores in mouth, or a toothache) UNUSUAL RASH, SWELLING OR PAIN  UNUSUAL VAGINAL DISCHARGE OR ITCHING   Items with * indicate a potential emergency and should be followed up as soon as possible or go to the Emergency Department if any problems should occur.  Please show the CHEMOTHERAPY ALERT CARD or IMMUNOTHERAPY ALERT CARD at  check-in to the Emergency Department and triage nurse.  Should you have questions after your visit or need to cancel or reschedule your appointment, please contact [facility]  Dept: [phone]  and follow the prompts.  Office hours are 8:00 a.m. to 4:30 p.m. Monday - Friday. Please note that voicemails left after 4:00 p.m. may not be returned until the following business day.  We are closed weekends and major holidays. You have access to a nurse at all times for urgent questions. Please call the main number to the clinic Dept: [phone] and follow the prompts.   For any non-urgent questions, you may also contact your provider using MyChart. We now offer e-Visits for anyone 42 and older to request care online for non-urgent symptoms. For details visit [email or web address].   Also download the MyChart app! Go to the app store, search "MyChart", open the app, select [facility], and log in with your MyChart username and password.  Masks are optional in the cancer centers. If you would like for your care team to wear a mask while they are taking care of you, please let them know. You may have one support person who is at least 73 years old accompany you for your appointments.

## 2022-10-12 ENCOUNTER — Ambulatory Visit
Admission: RE | Admit: 2022-10-12 | Discharge: 2022-10-12 | Disposition: A | Discharge status: Home / Self Care | Payer: Medicare HMO | Source: Ambulatory Visit | Attending: Radiation Oncology | Admitting: Radiation Oncology

## 2022-10-12 DIAGNOSIS — C7931 Secondary malignant neoplasm of brain: Secondary | ICD-10-CM

## 2022-10-12 MED ORDER — GADOPICLENOL 0.5 MMOL/ML IV SOLN
7.0000 mL | Freq: Once | INTRAVENOUS | Status: AC | PRN
  Administered 2022-10-12: 7 mL via INTRAVENOUS

## 2022-10-13 ENCOUNTER — Other Ambulatory Visit: Payer: Self-pay

## 2022-10-16 ENCOUNTER — Inpatient Hospital Stay: Payer: Medicare HMO

## 2022-10-17 ENCOUNTER — Encounter: Payer: Self-pay | Admitting: Urology

## 2022-10-17 NOTE — Progress Notes (Signed)
Telephone nursing appointment for a 71 yo woman with stage IV NSCLC, adenocarcinoma of the left upper lung - Stage IVA - with brain metastases. Patient is to receive most recent scan results from 10/12/22 per Ashlyn Bruning PA-C. I verified patient's identity and began nursing interview. Patient reports doing well.   Meaningful use complete.   Patient aware of their 8:30am-10/18/22 telephone appointment w/ Ashlyn Bruning PA-C. I left my extension [phone] in case patient needs anything. Patient verbalized understanding. This concludes the nursing interview.   Patient contact [phone]     Leandra Kern, LPN

## 2022-10-18 ENCOUNTER — Ambulatory Visit
Admission: RE | Admit: 2022-10-18 | Discharge: 2022-10-18 | Disposition: A | Discharge status: Home / Self Care | Payer: Medicare HMO | Source: Ambulatory Visit | Attending: Urology | Admitting: Urology

## 2022-10-18 DIAGNOSIS — C7931 Secondary malignant neoplasm of brain: Secondary | ICD-10-CM | POA: Diagnosis not present

## 2022-10-18 DIAGNOSIS — C3490 Malignant neoplasm of unspecified part of unspecified bronchus or lung: Secondary | ICD-10-CM

## 2022-10-18 DIAGNOSIS — C3412 Malignant neoplasm of upper lobe, left bronchus or lung: Secondary | ICD-10-CM | POA: Diagnosis not present

## 2022-10-18 NOTE — Progress Notes (Signed)
Radiation Oncology         [phone] ________________________________  Name: Jill [name] MRN: [id number]  Date: 10/18/2022  DOB: 1951-05-26  Post Treatment Note  CC: Jill Feil, MD  Jill Bears, MD  Diagnosis:   71 yo woman with stage IV NSCLC, adenocarcinoma of the left upper lung - Stage IVA - with brain metastases     Interval Since Last Radiation: 1 year 09/28/21: SRS//PTV2-3: The metastatic lesions  in the left occipital (3 mm) and left temporal (1 mm) were treated to 20 Gy in a single fraction  06/23/21: SRS// PTV1: The solitary 7 mm left frontal  brain metastasis was treated to 20 Gy in a single fraction   06/15/21 - 08/01/21: The primary tumor in the left lung and nodes were treated to 66 Gy in 33 fractions of 2 Gy (concurrent with chemotherapy).  Narrative:  I contacted the patient to conduct her routine scheduled 3 month follow up visit to review results of her recent MRI brain scan via telephone to spare the patient unnecessary potential exposure in the healthcare setting during the current COVID-19 pandemic.  The patient was notified in advance and gave permission to proceed with this visit format.    She tolerated her SRS treatments well and she remains without complaints.  Her recent MRI brain scan from 10/12/2022 shows a stable appearance of a 4-5 mm focus of enhancement in the left frontal lobe (previously treated lesion) and no visible sequela of previously treated lesions in the left temporal lobe and left occipital lobe.  There are no new lesions.  We reviewed these results today.                        On review of systems, the patient states that she is doing well in general and is currently without complaints aside from a mild, persistent cough that is improving since recently completing antibiotics.  She specifically denies chest pain, increased shortness of breath or hemoptysis.  She reports a healthy appetite and is maintaining her weight.  She denies  dysphagia, abdominal pain, nausea, vomiting or night sweats.  She has occasional, mild headaches but nothing that has been persistent or concerning.  She denies any decrease in her visual or auditory acuity, dizziness/imbalance, focal weakness in the upper or lower extremities, tremor or seizure activity.  She initially completed 6 cycles of systemic therapy with carboplatin/paclitaxel systemic chemotherapy prior to switching to carboplatin, Alimta and Keytruda which she completed 5 cycles of. She is now on maintenance therapy with Alimta/Keytruda since 12/29/2021 and continues to tolerate this well.  Her restaging CT C/A/P from 06/25/22 was stable in appearance and without evidence of metastatic disease. Overall, she is pleased with her progress to date and is scheduled for repeat systemic imaging on 10/27/22.  Her 12th grandchild, 43th grand-daughter, was born 11/02/2021 and brings her great joy.  ALLERGIES:  is allergic to varenicline, ace inhibitors, prednisone, betalin 12 [vitamin b12], penicillins, and sulfa drugs cross reactors.  Meds: Current Outpatient Medications  Medication Sig Dispense Refill   acetaminophen (TYLENOL) 500 MG tablet Take 500 mg by mouth every 6 (six) hours as needed.     amLODipine (NORVASC) 10 MG tablet Take 1 tablet by mouth once daily 90 tablet 1   amoxicillin-clavulanate (AUGMENTIN) 875-125 MG tablet Take 1 tablet by mouth 2 (two) times daily. 14 tablet 0   aspirin EC 81 MG tablet Take 1 tablet (81 mg total) by  mouth daily. 90 tablet 2   atorvastatin (LIPITOR) 40 MG tablet Take 1 tablet by mouth once daily 90 tablet 1   azithromycin (ZITHROMAX Z-PAK) 250 MG tablet Use as instructed 6 each 0   Cholecalciferol (VITAMIN D3) 250 MCG (10000 UT) capsule Take 10,000 mcg by mouth daily.     diphenhydrAMINE HCl (BENADRYL ALLERGY PO) Take 25 mg by mouth at bedtime as needed (for sleep).     escitalopram (LEXAPRO) 10 MG tablet Take 1 tablet by mouth once daily 90 tablet 1   folic  acid (FOLVITE) 1 MG tablet Take 1 tablet by mouth once daily 30 tablet 0   levothyroxine (SYNTHROID) 137 MCG tablet TAKE 1 TABLET BY MOUTH ONCE DAILY BEFORE BREAKFAST 30 tablet 2   lidocaine-prilocaine (EMLA) cream Apply 1 Application topically as needed. 30 g 2   losartan (COZAAR) 100 MG tablet Take 1 tablet by mouth once daily 90 tablet 1   metoprolol succinate (TOPROL-XL) 50 MG 24 hr tablet TAKE 1 TABLET BY MOUTH ONCE DAILY WITH A MEAL OR  IMMEDIATELY  FOLLOWING 90 tablet 1   Multiple Vitamins-Minerals (MULTIVITAMIN WITH MINERALS) tablet Take 1 tablet by mouth daily.     nitroGLYCERIN (NITROSTAT) 0.4 MG SL tablet Place 1 tablet (0.4 mg total) under the tongue every 5 (five) minutes as needed. For chest pain (Patient not taking: Reported on 10/26/2021) 25 tablet 6   Vitamin A 2400 MCG (8000 UT) TABS Take 2,400 mg by mouth daily.     No current facility-administered medications for this encounter.    Physical Findings:  vitals were not taken for this visit.  Pain Assessment Pain Score: 0-No pain/10 Unable to assess due to telephone follow-up visit format.  Lab Findings: Lab Results  Component Value Date   WBC 5.5 10/11/2022   HGB 11.6 (L) 10/11/2022   HCT 35.4 (L) 10/11/2022   MCV 106.3 (H) 10/11/2022   PLT 303 10/11/2022     Radiographic Findings: MR Brain W Wo Contrast  Result Date: 10/14/2022 CLINICAL DATA:  71 year old female with stage IV lung cancer, brain metastases diagnosed in July 2022. Status post SRS. Restaging. History also of coil embolized basilar tip aneurysm. EXAM: MRI HEAD WITHOUT AND WITH CONTRAST TECHNIQUE: Multiplanar, multiecho pulse sequences of the brain and surrounding structures were obtained without and with intravenous contrast. CONTRAST:  7 mL Vueway COMPARISON:  Brain MRI 07/12/2022 and earlier. FINDINGS: Brain: Small round 5 mm enhancing lesion of the anterior left superior frontal gyrus at the gray-white junction is unchanged since February. Mild  regional T2 and FLAIR hyperintensity appears stable without mass effect. No other No abnormal enhancement identified. No dural thickening identified. Right superior frontal gyrus area of mild regional hemosiderin and encephalomalacia is stable, possibly related to previous ventriculostomy. No superimposed restricted diffusion to suggest acute infarction. No midline shift, mass effect, ventriculomegaly, extra-axial collection or acute intracranial hemorrhage. Cervicomedullary junction and pituitary are within normal limits. Widely scattered bilateral cerebral white matter nonspecific T2 and FLAIR hyperintensity is stable. Chronic microhemorrhage in the anterior left frontal lobe white matter is stable. Vascular: Major intracranial vascular flow voids are preserved. There is generalized intracranial artery dolichoectasia most pronounced at the distal basilar. History of coiled basilar tip aneurysm. Skull and upper cervical spine: Stable. Visualized bone marrow signal is within normal limits. Negative for age visible cervical spine. Sinuses/Orbits: Stable, negative. Other: Mastoids remain clear. Visible internal auditory structures appear normal. Small benign chronic left scalp soft tissue nodule is unchanged from last year. IMPRESSION:  1. Continued stable and satisfactory post treatment appearance of the brain. Solitary residual 5 mm enhancing lesion of the left superior frontal gyrus is unchanged. 2. No new metastatic disease or acute intracranial abnormality identified. Electronically Signed   By: Genevie Ann M.D.   On: 10/14/2022 13:54   IR IMAGING GUIDED PORT INSERTION  Result Date: 10/04/2022 CLINICAL DATA:  Lung cancer EXAM: RIGHT INTERNAL JUGULAR SINGLE LUMEN POWER PORT CATHETER INSERTION Date:  10/04/2022 10/04/2022 4:31 pm Radiologist:  Jerilynn Mages. Daryll Brod, MD Guidance:  ULTRASOUND AND FLUOROSCOPIC MEDICATIONS: 1% LIDOCAINE LOCAL WITH EPINEPHRINE ANESTHESIA/SEDATION: Versed 2.0 mg IV; Fentanyl 100 mcg IV;  Moderate Sedation Time:  20 minutes The patient was continuously monitored during the procedure by the interventional radiology nurse under my direct supervision. FLUOROSCOPY: 0 minutes, 47 seconds (2.0 mGy) COMPLICATIONS: None immediate. CONTRAST:  None. PROCEDURE: Informed consent was obtained from the patient following explanation of the procedure, risks, benefits and alternatives. The patient understands, agrees and consents for the procedure. All questions were addressed. A time out was performed. Maximal barrier sterile technique utilized including caps, mask, sterile gowns, sterile gloves, large sterile drape, hand hygiene, and 2% chlorhexidine scrub. Under sterile conditions and local anesthesia, right internal jugular micropuncture venous access was performed. Access was performed with ultrasound. Images were obtained for documentation of the patent right internal jugular vein. A guide wire was inserted followed by a transitional dilator. This allowed insertion of a guide wire and catheter into the IVC. Measurements were obtained from the SVC / RA junction back to the right IJ venotomy site. In the right infraclavicular chest, a subcutaneous pocket was created over the second anterior rib. This was done under sterile conditions and local anesthesia. 1% lidocaine with epinephrine was utilized for this. A 2.5 cm incision was made in the skin. Blunt dissection was performed to create a subcutaneous pocket over the right pectoralis major muscle. The pocket was flushed with saline vigorously. There was adequate hemostasis. The port catheter was assembled and checked for leakage. The port catheter was secured in the pocket with two retention sutures. The tubing was tunneled subcutaneously to the right venotomy site and inserted into the SVC/RA junction through a valved peel-away sheath. Position was confirmed with fluoroscopy. Images were obtained for documentation. The patient tolerated the procedure well. No  immediate complications. Incisions were closed in a two layer fashion with 4 - 0 Vicryl suture. Dermabond was applied to the skin. The port catheter was accessed, blood was aspirated followed by saline and heparin flushes. Needle was removed. A dry sterile dressing was applied. IMPRESSION: Ultrasound and fluoroscopically guided right internal jugular single lumen power port catheter insertion. Tip in the SVC/RA junction. Catheter ready for use. Electronically Signed   By: Jerilynn Mages.  Shick M.D.   On: 10/04/2022 16:47    Impression/Plan: 5. 71 yo woman with brain metastasis secondary to metastatic, NSCLC, adenocarcinoma of the left upper lung - Stage IVA. She has recovered well from the effects of her recent [facility] radiotherapy and remains without complaints.  She continues on maintenance therapy with Alimta and Keytruda which she is tolerating well.   Her recent MRI brain scan from 10/12/2022 shows a stable appearance of a 4-5 mm focus of enhancement in the left frontal lobe (previously treated lesion) and no visible sequela of previously treated lesions in the left temporal lobe and left occipital lobe. There are no new lesions to suggest disease recurrence or progression.  She will continue management of her systemic disease under the  care and direction of Dr. Earlie Server and is scheduled for a follow up visit on 11/01/22 following repeat systemic imaging scheduled for 10/27/22.  Regarding the brain metastases, we will continue to monitor closely with serial MRI brain scans every 3 months and plan to connect by telephone following each scan to review results and recommendations from the multidisciplinary brain tumor board.  She appears to have a good understanding of these recommendations and is comfortable and in agreement with the stated plan.  She knows to call at anytime in the interim with any questions or concerns related to her previous radiation.   I personally spent 20 minutes in this encounter including chart  review, reviewing radiological studies, telephone discussion with the patient, entering orders and completing documentation.     Nicholos Johns, PA-C

## 2022-10-22 ENCOUNTER — Other Ambulatory Visit: Payer: Self-pay

## 2022-10-24 ENCOUNTER — Telehealth: Payer: Self-pay

## 2022-10-24 NOTE — Patient Outreach (Signed)
  Care Coordination   10/24/2022 Name: Jill [name] MRN: [id number] DOB: [birth year]   Care Coordination Outreach Attempts:  A second unsuccessful outreach was attempted today to offer the patient with information about available care coordination services as a benefit of their health plan.     Follow Up Plan:  Additional outreach attempts will be made to offer the patient care coordination information and services.   Encounter Outcome:  No Answer   Care Coordination Interventions:  No, not indicated    Jone Baseman, RN, MSN [facility] Care Management Coordinator Direct Line [phone]

## 2022-10-27 ENCOUNTER — Ambulatory Visit (HOSPITAL_COMMUNITY)
Admission: RE | Admit: 2022-10-27 | Discharge: 2022-10-27 | Disposition: A | Discharge status: Home / Self Care | Payer: Medicare HMO | Source: Ambulatory Visit | Attending: Physician Assistant | Admitting: Physician Assistant

## 2022-10-27 DIAGNOSIS — J439 Emphysema, unspecified: Secondary | ICD-10-CM | POA: Diagnosis not present

## 2022-10-27 DIAGNOSIS — N289 Disorder of kidney and ureter, unspecified: Secondary | ICD-10-CM | POA: Diagnosis not present

## 2022-10-27 DIAGNOSIS — J90 Pleural effusion, not elsewhere classified: Secondary | ICD-10-CM | POA: Diagnosis not present

## 2022-10-27 DIAGNOSIS — C3412 Malignant neoplasm of upper lobe, left bronchus or lung: Secondary | ICD-10-CM | POA: Diagnosis not present

## 2022-10-27 DIAGNOSIS — R918 Other nonspecific abnormal finding of lung field: Secondary | ICD-10-CM | POA: Diagnosis not present

## 2022-10-27 DIAGNOSIS — C3490 Malignant neoplasm of unspecified part of unspecified bronchus or lung: Secondary | ICD-10-CM | POA: Diagnosis not present

## 2022-10-27 MED ORDER — SODIUM CHLORIDE (PF) 0.9 % IJ SOLN
INTRAMUSCULAR | Status: AC
  Filled 2022-10-27: qty 50

## 2022-10-27 MED ORDER — IOHEXOL 300 MG/ML  SOLN
100.0000 mL | Freq: Once | INTRAMUSCULAR | Status: AC | PRN
  Administered 2022-10-27: 100 mL via INTRAVENOUS

## 2022-10-31 NOTE — Progress Notes (Unsigned)
[facility] OFFICE PROGRESS NOTE  [name], MD [place] [address]  DIAGNOSIS:  Stage IV (T2 a, N2, M1b) non-small cell lung cancer, adenocarcinoma presented with left upper lobe lung mass in addition to left hilar and precarinal metastatic adenopathy and small left lower lobe pulmonary nodule in addition to solitary left frontal brain metastasis diagnosed in July 2022.   Molecular studies by [facility] showed  Positive KRAS G12C mutation PD-L1 expression was less than 1%  PRIOR THERAPY: 1) Status post SRS to the solitary brain metastasis under the care of Dr. Tammi Klippel. Completed on 06/23/21 2)  Treatment for the locally advanced disease in the chest with carboplatin for AUC of 2 and paclitaxel 45 Mg/M2.  Status post 6 cycles.  Last dose was given 07/25/2021. 3) SRS to the additional metastatic brain lesions on 09/28/22  CURRENT THERAPY: Systemic chemotherapy with carboplatin for AUC of 5, Alimta 500 Mg/M2 and Keytruda 200 Mg IV every 3 weeks.  First dose August 31, 2021. Status post 19 cycles. Starting from cycle #5, she will start Alimta and keytruda maintenance IV every 3 weeks.   INTERVAL HISTORY: Jill [name] 71 y.o. female returns to the clinic today for a follow-up visit.  The patient is feeling fairly well today without any concerning complaints. he patient is currently undergoing maintenance chemotherapy and immunotherapy. She is tolerating it fairly well besides fatigue. She denies any fever or chills. Denies any recent night sweats. She had lost weight a her last appointment. Her weight is *** today. She drinks 3 boost per day. She denies any chest pain or hemoptysis. At her last appointment, she was given a prescription for antibitoics for cough. Of note, she still smokes 1 ppd and does not take her inhalers as prescribed by pulmonary medicine for her COPD. Denies any nausea, vomiting, constipation, or diarrhea. She denies rashes and  itching.She recently had a restaging CT scan performed. She is here for evaluation and repeat blood work before starting cycle #20   MEDICAL HISTORY: Past Medical History:  Diagnosis Date   Anxiety    ON PAXIL, XANAX   AVM (arteriovenous malformation) brain    s/p stent/coil   Blood transfusion    x 2   Cancer ([facility])    Left lung   COPD (chronic obstructive pulmonary disease) (HCC)    no inhaler, no oxygen   Coronary artery disease    Prior inferior MI with stent to RCA, s/p CABG in 2008   Depression    Dizziness    Dyspnea    with exertion, no oxygen   Fatigue    Foot injury 06/01/2017   right - RESOLVED, no longer an issue per patient 05/18/21   GERD (gastroesophageal reflux disease)    Headache(784.0)    UNRUPTURED CEREBRAL ANEURYSM   Hyperlipidemia    Hypertension    Hypothyroidism    Infected cyst of skin 09/18/2013   Left knee pain 06/05/2012   Lung mass    Left lung   Myocardial infarction ([facility])    Normal nuclear stress test Ju;y 2012   No ischemia. EF 70%; fixed defect involving septum, inferoseptal and inferior wall   Pain, dental 02/06/2017   Poor dental hygiene    Retroperitoneal bleeding    Following cardiac cath   Tobacco abuse    Urine discoloration 09/18/2013   Urine incontinence 07/06/2020   UTI (lower urinary tract infection) 10/16/2013    ALLERGIES:  is allergic to  varenicline, ace inhibitors, prednisone, betalin 12 [vitamin b12], penicillins, and sulfa drugs cross reactors.  MEDICATIONS:  Current Outpatient Medications  Medication Sig Dispense Refill   acetaminophen (TYLENOL) 500 MG tablet Take 500 mg by mouth every 6 (six) hours as needed.     amLODipine (NORVASC) 10 MG tablet Take 1 tablet by mouth once daily 90 tablet 1   amoxicillin-clavulanate (AUGMENTIN) 875-125 MG tablet Take 1 tablet by mouth 2 (two) times daily. 14 tablet 0   aspirin EC 81 MG tablet Take 1 tablet (81 mg total) by mouth daily. 90 tablet 2   atorvastatin (LIPITOR) 40 MG  tablet Take 1 tablet by mouth once daily 90 tablet 1   azithromycin (ZITHROMAX Z-PAK) 250 MG tablet Use as instructed 6 each 0   Cholecalciferol (VITAMIN D3) 250 MCG (10000 UT) capsule Take 10,000 mcg by mouth daily.     diphenhydrAMINE HCl (BENADRYL ALLERGY PO) Take 25 mg by mouth at bedtime as needed (for sleep).     escitalopram (LEXAPRO) 10 MG tablet Take 1 tablet by mouth once daily 90 tablet 1   folic acid (FOLVITE) 1 MG tablet Take 1 tablet by mouth once daily 30 tablet 0   levothyroxine (SYNTHROID) 137 MCG tablet TAKE 1 TABLET BY MOUTH ONCE DAILY BEFORE BREAKFAST 30 tablet 2   lidocaine-prilocaine (EMLA) cream Apply 1 Application topically as needed. 30 g 2   losartan (COZAAR) 100 MG tablet Take 1 tablet by mouth once daily 90 tablet 1   metoprolol succinate (TOPROL-XL) 50 MG 24 hr tablet TAKE 1 TABLET BY MOUTH ONCE DAILY WITH A MEAL OR  IMMEDIATELY  FOLLOWING 90 tablet 1   Multiple Vitamins-Minerals (MULTIVITAMIN WITH MINERALS) tablet Take 1 tablet by mouth daily.     nitroGLYCERIN (NITROSTAT) 0.4 MG SL tablet Place 1 tablet (0.4 mg total) under the tongue every 5 (five) minutes as needed. For chest pain (Patient not taking: Reported on 10/26/2021) 25 tablet 6   Vitamin A 2400 MCG (8000 UT) TABS Take 2,400 mg by mouth daily.     No current facility-administered medications for this visit.    SURGICAL HISTORY:  Past Surgical History:  Procedure Laterality Date   CARDIAC CATHETERIZATION  01/02/2007   IT REVEALS MILD INFERIOR WALL HYPOKINESIS. THE EJECTION FRACTION IS AROUND 50%   COLONOSCOPY     CORONARY ARTERY BYPASS GRAFT  11/06/2006   LIMA to LAD, SVG to DX, SVG to LCX & SVG to OM 1 & 2, and SVG to PD   CORONARY STENT PLACEMENT     Remote past stent to RCA   EYE SURGERY Right    cataracts removed   HEMORRHOID SURGERY  11/07/1987   IR IMAGING GUIDED PORT INSERTION  10/04/2022   UPPER GI ENDOSCOPY     VENTRICULOSTOMY  10/06/2011   Procedure: VENTRICULOSTOMY;  Surgeon: Winfield Cunas;  Location: [facility];  Service: Neurosurgery;  Laterality: Right;  Insertion of Ventriculostomy Catheter   VIDEO BRONCHOSCOPY WITH ENDOBRONCHIAL NAVIGATION Left 05/20/2021   Procedure: VIDEO BRONCHOSCOPY WITH ENDOBRONCHIAL NAVIGATION;  Surgeon: Garner Nash, DO;  Location: [facility];  Service: Pulmonary;  Laterality: Left;   VIDEO BRONCHOSCOPY WITH ENDOBRONCHIAL ULTRASOUND Bilateral 05/20/2021   Procedure: VIDEO BRONCHOSCOPY WITH ENDOBRONCHIAL ULTRASOUND;  Surgeon: Garner Nash, DO;  Location: [facility];  Service: Pulmonary;  Laterality: Bilateral;   WISDOM TOOTH EXTRACTION      REVIEW OF SYSTEMS:   Review of Systems  Constitutional: Negative for appetite change, chills, fatigue, fever and unexpected  weight change.  HENT:   Negative for mouth sores, nosebleeds, sore throat and trouble swallowing.   Eyes: Negative for eye problems and icterus.  Respiratory: Negative for cough, hemoptysis, shortness of breath and wheezing.   Cardiovascular: Negative for chest pain and leg swelling.  Gastrointestinal: Negative for abdominal pain, constipation, diarrhea, nausea and vomiting.  Genitourinary: Negative for bladder incontinence, difficulty urinating, dysuria, frequency and hematuria.   Musculoskeletal: Negative for back pain, gait problem, neck pain and neck stiffness.  Skin: Negative for itching and rash.  Neurological: Negative for dizziness, extremity weakness, gait problem, headaches, light-headedness and seizures.  Hematological: Negative for adenopathy. Does not bruise/bleed easily.  Psychiatric/Behavioral: Negative for confusion, depression and sleep disturbance. The patient is not nervous/anxious.     PHYSICAL EXAMINATION:  There were no vitals taken for this visit.  ECOG PERFORMANCE STATUS: {CHL ONC ECOG [id number]  Physical Exam  Constitutional: Oriented to person, place, and time and well-developed, well-nourished, and in no distress. No distress.  HENT:  Head:  Normocephalic and atraumatic.  Mouth/Throat: Oropharynx is clear and moist. No oropharyngeal exudate.  Eyes: Conjunctivae are normal. Right eye exhibits no discharge. Left eye exhibits no discharge. No scleral icterus.  Neck: Normal range of motion. Neck supple.  Cardiovascular: Normal rate, regular rhythm, normal heart sounds and intact distal pulses.   Pulmonary/Chest: Effort normal and breath sounds normal. No respiratory distress. No wheezes. No rales.  Abdominal: Soft. Bowel sounds are normal. Exhibits no distension and no mass. There is no tenderness.  Musculoskeletal: Normal range of motion. Exhibits no edema.  Lymphadenopathy:    No cervical adenopathy.  Neurological: Alert and oriented to person, place, and time. Exhibits normal muscle tone. Gait normal. Coordination normal.  Skin: Skin is warm and dry. No rash noted. Not diaphoretic. No erythema. No pallor.  Psychiatric: Mood, memory and judgment normal.  Vitals reviewed.  LABORATORY DATA: Lab Results  Component Value Date   WBC 5.5 10/11/2022   HGB 11.6 (L) 10/11/2022   HCT 35.4 (L) 10/11/2022   MCV 106.3 (H) 10/11/2022   PLT 303 10/11/2022      Chemistry      Component Value Date/Time   NA 139 10/11/2022 0951   NA 141 04/12/2021 1356   K 3.8 10/11/2022 0951   CL 103 10/11/2022 0951   CO2 31 10/11/2022 0951   BUN 22 10/11/2022 0951   BUN 13 04/12/2021 1356   CREATININE 0.92 10/11/2022 0951   CREATININE 0.99 08/29/2016 1130      Component Value Date/Time   CALCIUM 9.7 10/11/2022 0951   ALKPHOS 79 10/11/2022 0951   AST 33 10/11/2022 0951   ALT 23 10/11/2022 0951   BILITOT 0.3 10/11/2022 0951       RADIOGRAPHIC STUDIES:  MR Brain W Wo Contrast  Result Date: 10/14/2022 CLINICAL DATA:  71 year old female with stage IV lung cancer, brain metastases diagnosed in July 2022. Status post SRS. Restaging. History also of coil embolized basilar tip aneurysm. EXAM: MRI HEAD WITHOUT AND WITH CONTRAST TECHNIQUE:  Multiplanar, multiecho pulse sequences of the brain and surrounding structures were obtained without and with intravenous contrast. CONTRAST:  7 mL [facility] COMPARISON:  Brain MRI 07/12/2022 and earlier. FINDINGS: Brain: Small round 5 mm enhancing lesion of the anterior left superior frontal gyrus at the gray-white junction is unchanged since February. Mild regional T2 and FLAIR hyperintensity appears stable without mass effect. No other No abnormal enhancement identified. No dural thickening identified. Right superior frontal gyrus area of mild regional hemosiderin  and encephalomalacia is stable, possibly related to previous ventriculostomy. No superimposed restricted diffusion to suggest acute infarction. No midline shift, mass effect, ventriculomegaly, extra-axial collection or acute intracranial hemorrhage. Cervicomedullary junction and pituitary are within normal limits. Widely scattered bilateral cerebral white matter nonspecific T2 and FLAIR hyperintensity is stable. Chronic microhemorrhage in the anterior left frontal lobe white matter is stable. Vascular: Major intracranial vascular flow voids are preserved. There is generalized intracranial artery dolichoectasia most pronounced at the distal basilar. History of coiled basilar tip aneurysm. Skull and upper cervical spine: Stable. Visualized bone marrow signal is within normal limits. Negative for age visible cervical spine. Sinuses/Orbits: Stable, negative. Other: Mastoids remain clear. Visible internal auditory structures appear normal. Small benign chronic left scalp soft tissue nodule is unchanged from last year. IMPRESSION: 1. Continued stable and satisfactory post treatment appearance of the brain. Solitary residual 5 mm enhancing lesion of the left superior frontal gyrus is unchanged. 2. No new metastatic disease or acute intracranial abnormality identified. Electronically Signed   By: Genevie Ann M.D.   On: 10/14/2022 13:54   IR IMAGING GUIDED PORT  INSERTION  Result Date: 10/04/2022 CLINICAL DATA:  Lung cancer EXAM: RIGHT INTERNAL JUGULAR SINGLE LUMEN POWER PORT CATHETER INSERTION Date:  10/04/2022 10/04/2022 4:31 pm Radiologist:  Jerilynn Mages. Daryll Brod, MD Guidance:  ULTRASOUND AND FLUOROSCOPIC MEDICATIONS: 1% LIDOCAINE LOCAL WITH EPINEPHRINE ANESTHESIA/SEDATION: Versed 2.0 mg IV; Fentanyl 100 mcg IV; Moderate Sedation Time:  20 minutes The patient was continuously monitored during the procedure by the interventional radiology nurse under my direct supervision. FLUOROSCOPY: 0 minutes, 47 seconds (2.0 mGy) COMPLICATIONS: None immediate. CONTRAST:  None. PROCEDURE: Informed consent was obtained from the patient following explanation of the procedure, risks, benefits and alternatives. The patient understands, agrees and consents for the procedure. All questions were addressed. A time out was performed. Maximal barrier sterile technique utilized including caps, mask, sterile gowns, sterile gloves, large sterile drape, hand hygiene, and 2% chlorhexidine scrub. Under sterile conditions and local anesthesia, right internal jugular micropuncture venous access was performed. Access was performed with ultrasound. Images were obtained for documentation of the patent right internal jugular vein. A guide wire was inserted followed by a transitional dilator. This allowed insertion of a guide wire and catheter into the IVC. Measurements were obtained from the SVC / RA junction back to the right IJ venotomy site. In the right infraclavicular chest, a subcutaneous pocket was created over the second anterior rib. This was done under sterile conditions and local anesthesia. 1% lidocaine with epinephrine was utilized for this. A 2.5 cm incision was made in the skin. Blunt dissection was performed to create a subcutaneous pocket over the right pectoralis major muscle. The pocket was flushed with saline vigorously. There was adequate hemostasis. The port catheter was assembled and  checked for leakage. The port catheter was secured in the pocket with two retention sutures. The tubing was tunneled subcutaneously to the right venotomy site and inserted into the SVC/RA junction through a valved peel-away sheath. Position was confirmed with fluoroscopy. Images were obtained for documentation. The patient tolerated the procedure well. No immediate complications. Incisions were closed in a two layer fashion with 4 - 0 Vicryl suture. Dermabond was applied to the skin. The port catheter was accessed, blood was aspirated followed by saline and heparin flushes. Needle was removed. A dry sterile dressing was applied. IMPRESSION: Ultrasound and fluoroscopically guided right internal jugular single lumen power port catheter insertion. Tip in the SVC/RA junction. Catheter ready for use. Electronically  Signed   By: Jerilynn Mages.  Shick M.D.   On: 10/04/2022 16:47     ASSESSMENT/PLAN:  This is a very pleasant 71 year old Caucasian female diagnosed with stage IV (T2 a, N2, M1 B) non-small cell lung cancer, adenocarcinoma.  She presented with a left upper lobe lung mass in addition to left hilar and precarinal metastatic adenopathy as well as a small left lower lobe pulmonary nodule and a solitary left frontal brain metastasis.  This was diagnosed in July 2022.  Her molecular studies show that she is positive for K-ras G12C mutation and her PD-L1 expression is negative.    She completed SRS to the solitary brain metastasis under the care of Dr. Tammi Klippel which was completed on 06/23/2021.  She had a repeat brain MRI performed on 09/15/2021 which shows a new 1 mm and 3 mm metastatic brain lesions.  She received SRS on 09/28/2021.   She completed concurrent chemoradiation for the locally advanced disease in the chest with carboplatin for an AUC of 2 and paclitaxel 45 mg per metered squared.  She is status post 6 cycles.  She had been tolerating treatment well without any concerning adverse side effects.    The  patient is currently undergoing systemic chemotherapy with carboplatin AUC 5, Alimta 500 mg per metered squared, Keytruda 20 mg IV every 3 weeks.  She is status post 19 cycles.  She started maintenance alimta and Keytruda starting from cycle #5.    The patient recently had a restaging CT scan performed.  Dr. Julien Nordmann personally independently reviewed the scan discussed results with the patient today.  The scan showed ***.   Dr. Julien Nordmann recommends that the patient ***same treatment at the same dose.  We will see her back for follow-up visit in 3 weeks for evaluation repeat blood work before undergoing cycle #21.  The patient was advised to call immediately if she has any concerning symptoms in the interval. The patient voices understanding of current disease status and treatment options and is in agreement with the current care plan. All questions were answered. The patient knows to call the clinic with any problems, questions or concerns. We can certainly see the patient much sooner if necessary    No orders of the defined types were placed in this encounter.    I spent {CHL ONC TIME VISIT - TSVXB:[id number]} counseling the patient face to face. The total time spent in the appointment was {CHL ONC TIME VISIT - RAQTM:[id number]}.  Praise Dolecki L Codylee Patil, PA-C 10/31/22

## 2022-11-01 ENCOUNTER — Inpatient Hospital Stay: Payer: Medicare HMO | Admitting: Internal Medicine

## 2022-11-01 ENCOUNTER — Other Ambulatory Visit: Payer: Medicare HMO

## 2022-11-01 ENCOUNTER — Inpatient Hospital Stay: Payer: Medicare HMO

## 2022-11-01 ENCOUNTER — Ambulatory Visit: Payer: Medicare HMO | Admitting: Adult Health

## 2022-11-01 ENCOUNTER — Inpatient Hospital Stay (HOSPITAL_BASED_OUTPATIENT_CLINIC_OR_DEPARTMENT_OTHER): Payer: Medicare HMO | Admitting: Physician Assistant

## 2022-11-01 ENCOUNTER — Ambulatory Visit: Payer: Medicare HMO | Admitting: Internal Medicine

## 2022-11-01 ENCOUNTER — Ambulatory Visit: Payer: Medicare HMO

## 2022-11-01 ENCOUNTER — Other Ambulatory Visit: Payer: Self-pay

## 2022-11-01 VITALS — BP 138/69 | HR 75 | Temp 97.8°F | Resp 20 | Ht 67.0 in | Wt 142.3 lb

## 2022-11-01 DIAGNOSIS — G9389 Other specified disorders of brain: Secondary | ICD-10-CM | POA: Diagnosis not present

## 2022-11-01 DIAGNOSIS — C3412 Malignant neoplasm of upper lobe, left bronchus or lung: Secondary | ICD-10-CM

## 2022-11-01 DIAGNOSIS — Z5112 Encounter for antineoplastic immunotherapy: Secondary | ICD-10-CM | POA: Diagnosis not present

## 2022-11-01 DIAGNOSIS — Z5111 Encounter for antineoplastic chemotherapy: Secondary | ICD-10-CM

## 2022-11-01 DIAGNOSIS — K314 Gastric diverticulum: Secondary | ICD-10-CM | POA: Diagnosis not present

## 2022-11-01 DIAGNOSIS — Z95828 Presence of other vascular implants and grafts: Secondary | ICD-10-CM

## 2022-11-01 DIAGNOSIS — E785 Hyperlipidemia, unspecified: Secondary | ICD-10-CM | POA: Diagnosis not present

## 2022-11-01 DIAGNOSIS — Z8719 Personal history of other diseases of the digestive system: Secondary | ICD-10-CM | POA: Diagnosis not present

## 2022-11-01 DIAGNOSIS — Z9221 Personal history of antineoplastic chemotherapy: Secondary | ICD-10-CM | POA: Diagnosis not present

## 2022-11-01 DIAGNOSIS — R5383 Other fatigue: Secondary | ICD-10-CM | POA: Diagnosis not present

## 2022-11-01 DIAGNOSIS — I700 Atherosclerosis of aorta: Secondary | ICD-10-CM | POA: Diagnosis not present

## 2022-11-01 DIAGNOSIS — Z888 Allergy status to other drugs, medicaments and biological substances status: Secondary | ICD-10-CM | POA: Diagnosis not present

## 2022-11-01 DIAGNOSIS — M5136 Other intervertebral disc degeneration, lumbar region: Secondary | ICD-10-CM | POA: Diagnosis not present

## 2022-11-01 DIAGNOSIS — Z923 Personal history of irradiation: Secondary | ICD-10-CM | POA: Diagnosis not present

## 2022-11-01 DIAGNOSIS — J449 Chronic obstructive pulmonary disease, unspecified: Secondary | ICD-10-CM | POA: Diagnosis not present

## 2022-11-01 DIAGNOSIS — Z87440 Personal history of urinary (tract) infections: Secondary | ICD-10-CM | POA: Diagnosis not present

## 2022-11-01 DIAGNOSIS — F17210 Nicotine dependence, cigarettes, uncomplicated: Secondary | ICD-10-CM | POA: Diagnosis not present

## 2022-11-01 DIAGNOSIS — Z880 Allergy status to penicillin: Secondary | ICD-10-CM | POA: Diagnosis not present

## 2022-11-01 DIAGNOSIS — Z882 Allergy status to sulfonamides status: Secondary | ICD-10-CM | POA: Diagnosis not present

## 2022-11-01 DIAGNOSIS — I252 Old myocardial infarction: Secondary | ICD-10-CM | POA: Diagnosis not present

## 2022-11-01 DIAGNOSIS — Z79899 Other long term (current) drug therapy: Secondary | ICD-10-CM | POA: Diagnosis not present

## 2022-11-01 DIAGNOSIS — M5137 Other intervertebral disc degeneration, lumbosacral region: Secondary | ICD-10-CM | POA: Diagnosis not present

## 2022-11-01 DIAGNOSIS — C779 Secondary and unspecified malignant neoplasm of lymph node, unspecified: Secondary | ICD-10-CM | POA: Diagnosis not present

## 2022-11-01 DIAGNOSIS — C7931 Secondary malignant neoplasm of brain: Secondary | ICD-10-CM | POA: Diagnosis not present

## 2022-11-01 DIAGNOSIS — E039 Hypothyroidism, unspecified: Secondary | ICD-10-CM | POA: Diagnosis not present

## 2022-11-01 LAB — CMP (CANCER CENTER ONLY)
ALT: 15 U/L (ref 0–44)
AST: 24 U/L (ref 15–41)
Albumin: 3.7 g/dL (ref 3.5–5.0)
Alkaline Phosphatase: 71 U/L (ref 38–126)
Anion gap: 4 — ABNORMAL LOW (ref 5–15)
BUN: 18 mg/dL (ref 8–23)
CO2: 30 mmol/L (ref 22–32)
Calcium: 9.2 mg/dL (ref 8.9–10.3)
Chloride: 105 mmol/L (ref 98–111)
Creatinine: 1.05 mg/dL — ABNORMAL HIGH (ref 0.44–1.00)
GFR, Estimated: 56 mL/min — ABNORMAL LOW (ref >60)
Glucose, Bld: 107 mg/dL — ABNORMAL HIGH (ref 70–99)
Potassium: 3.7 mmol/L (ref 3.5–5.1)
Sodium: 139 mmol/L (ref 135–145)
Total Bilirubin: 0.3 mg/dL (ref 0.3–1.2)
Total Protein: 7.1 g/dL (ref 6.5–8.1)

## 2022-11-01 LAB — CBC WITH DIFFERENTIAL (CANCER CENTER ONLY)
Abs Immature Granulocytes: 0.01 10*3/uL (ref 0.00–0.07)
Basophils Absolute: 0.1 10*3/uL (ref 0.0–0.1)
Basophils Relative: 1 %
Eosinophils Absolute: 0.2 10*3/uL (ref 0.0–0.5)
Eosinophils Relative: 4 %
HCT: 32.4 % — ABNORMAL LOW (ref 36.0–46.0)
Hemoglobin: 10.7 g/dL — ABNORMAL LOW (ref 12.0–15.0)
Immature Granulocytes: 0 %
Lymphocytes Relative: 26 %
Lymphs Abs: 1.4 10*3/uL (ref 0.7–4.0)
MCH: 34.7 pg — ABNORMAL HIGH (ref 26.0–34.0)
MCHC: 33 g/dL (ref 30.0–36.0)
MCV: 105.2 fL — ABNORMAL HIGH (ref 80.0–100.0)
Monocytes Absolute: 0.6 10*3/uL (ref 0.1–1.0)
Monocytes Relative: 12 %
Neutro Abs: 2.9 10*3/uL (ref 1.7–7.7)
Neutrophils Relative %: 57 %
Platelet Count: 322 10*3/uL (ref 150–400)
RBC: 3.08 MIL/uL — ABNORMAL LOW (ref 3.87–5.11)
RDW: 15.9 % — ABNORMAL HIGH (ref 11.5–15.5)
WBC Count: 5.1 10*3/uL (ref 4.0–10.5)
nRBC: 0 % (ref 0.0–0.2)

## 2022-11-01 MED ORDER — SODIUM CHLORIDE 0.9 % IV SOLN
200.0000 mg | Freq: Once | INTRAVENOUS | Status: AC
  Administered 2022-11-01: 200 mg via INTRAVENOUS
  Filled 2022-11-01: qty 8

## 2022-11-01 MED ORDER — SODIUM CHLORIDE 0.9 % IV SOLN
500.0000 mg/m2 | Freq: Once | INTRAVENOUS | Status: AC
  Administered 2022-11-01: 900 mg via INTRAVENOUS
  Filled 2022-11-01: qty 20

## 2022-11-01 MED ORDER — SODIUM CHLORIDE 0.9% FLUSH
10.0000 mL | INTRAVENOUS | Status: AC | PRN
  Administered 2022-11-01: 10 mL

## 2022-11-01 MED ORDER — PROCHLORPERAZINE MALEATE 10 MG PO TABS
10.0000 mg | ORAL_TABLET | Freq: Once | ORAL | Status: AC
  Administered 2022-11-01: 10 mg via ORAL
  Filled 2022-11-01: qty 1

## 2022-11-01 MED ORDER — SODIUM CHLORIDE 0.9 % IV SOLN: Freq: Once | INTRAVENOUS | Status: AC

## 2022-11-01 MED ORDER — SODIUM CHLORIDE 0.9% FLUSH: 10.0000 mL | INTRAVENOUS | Status: DC | PRN

## 2022-11-01 MED ORDER — HEPARIN SOD (PORK) LOCK FLUSH 100 UNIT/ML IV SOLN: 500.0000 [IU] | Freq: Once | INTRAVENOUS | Status: DC | PRN

## 2022-11-01 NOTE — Patient Instructions (Signed)
[facility]COL[facility]  Discharge Instructions: Thank you for choosing [facility] to provide your oncology and hematology care.   If you have a lab appointment with the [facility], please go directly to the [facility] and check in at the registration area.   Wear comfortable clothing and clothing appropriate for easy access to any Portacath or PICC line.   We strive to give you quality time with your provider. You may need to reschedule your appointment if you arrive late (15 or more minutes).  Arriving late affects you and other patients whose appointments are after yours.  Also, if you miss three or more appointments without notifying the office, you may be dismissed from the clinic at the provider's discretion.      For prescription refill requests, have your pharmacy contact our office and allow 72 hours for refills to be completed.    Today you received the following chemotherapy and/or immunotherapy agents pembrolizumab, pemetrexed      To help prevent nausea and vomiting after your treatment, we encourage you to take your nausea medication as directed.  BELOW ARE SYMPTOMS THAT SHOULD BE REPORTED IMMEDIATELY: *FEVER GREATER THAN 100.4 F (38 C) OR HIGHER *CHILLS OR SWEATING *NAUSEA AND VOMITING THAT IS NOT CONTROLLED WITH YOUR NAUSEA MEDICATION *UNUSUAL SHORTNESS OF BREATH *UNUSUAL BRUISING OR BLEEDING *URINARY PROBLEMS (pain or burning when urinating, or frequent urination) *BOWEL PROBLEMS (unusual diarrhea, constipation, pain near the anus) TENDERNESS IN MOUTH AND THROAT WITH OR WITHOUT PRESENCE OF ULCERS (sore throat, sores in mouth, or a toothache) UNUSUAL RASH, SWELLING OR PAIN  UNUSUAL VAGINAL DISCHARGE OR ITCHING   Items with * indicate a potential emergency and should be followed up as soon as possible or go to the Emergency Department if any problems should occur.  Please show the CHEMOTHERAPY ALERT CARD or IMMUNOTHERAPY ALERT CARD  at check-in to the Emergency Department and triage nurse.  Should you have questions after your visit or need to cancel or reschedule your appointment, please contact [facility]  Dept: [phone]  and follow the prompts.  Office hours are 8:00 a.m. to 4:30 p.m. Monday - Friday. Please note that voicemails left after 4:00 p.m. may not be returned until the following business day.  We are closed weekends and major holidays. You have access to a nurse at all times for urgent questions. Please call the main number to the clinic Dept: [phone] and follow the prompts.   For any non-urgent questions, you may also contact your provider using MyChart. We now offer e-Visits for anyone 63 and older to request care online for non-urgent symptoms. For details visit [email or web address].   Also download the MyChart app! Go to the app store, search "MyChart", open the app, select [facility], and log in with your MyChart username and password.

## 2022-11-03 ENCOUNTER — Telehealth: Payer: Self-pay | Admitting: Internal Medicine

## 2022-11-03 NOTE — Telephone Encounter (Signed)
Called patient regarding January appointment, patient has been called and voicemail was left.

## 2022-11-05 ENCOUNTER — Other Ambulatory Visit: Payer: Self-pay | Admitting: Family Medicine

## 2022-11-08 ENCOUNTER — Other Ambulatory Visit: Payer: Self-pay

## 2022-11-08 ENCOUNTER — Telehealth: Payer: Self-pay | Admitting: Medical Oncology

## 2022-11-08 NOTE — Telephone Encounter (Signed)
Spoke with patient. Informed of note left . Patient understood and said she would do that. Salvatore Marvel, CMA

## 2022-11-08 NOTE — Telephone Encounter (Signed)
Dr Gwendlyn Deutscher asked for Dr Julien Nordmann take over managing thyroid testing and meds since he is monitoring TSH.

## 2022-11-09 ENCOUNTER — Other Ambulatory Visit: Payer: Self-pay

## 2022-11-10 ENCOUNTER — Other Ambulatory Visit: Payer: Self-pay | Admitting: Internal Medicine

## 2022-11-10 ENCOUNTER — Telehealth: Payer: Self-pay

## 2022-11-10 MED ORDER — LEVOTHYROXINE SODIUM 137 MCG PO TABS: 137.0000 ug | ORAL_TABLET | Freq: Every day | ORAL | 2 refills | Status: DC

## 2022-11-10 NOTE — Patient Outreach (Signed)
  Care Coordination   11/10/2022 Name: Jill [name] MRN: [id number] DOB: 17-Oct-1951   Care Coordination Outreach Attempts:  A third unsuccessful outreach was attempted today to offer the patient with information about available care coordination services as a benefit of their health plan.   Follow Up Plan:  No further outreach attempts will be made at this time. We have been unable to contact the patient to offer or enroll patient in care coordination services  Encounter Outcome:  No Answer   Care Coordination Interventions:  No, not indicated    Jone Baseman, RN, MSN [facility] Care Management Coordinator Direct Line [phone]

## 2022-11-14 ENCOUNTER — Other Ambulatory Visit: Payer: Self-pay | Admitting: Internal Medicine

## 2022-11-17 NOTE — Progress Notes (Signed)
[facility] OFFICE PROGRESS NOTE  Jill Eland, MD [address] [place] [address]  DIAGNOSIS: Stage IV (T2 a, N2, M1b) non-small cell lung cancer, adenocarcinoma presented with left upper lobe lung mass in addition to left hilar and precarinal metastatic adenopathy and small left lower lobe pulmonary nodule in addition to solitary left frontal brain metastasis diagnosed in July 2022.   Molecular studies by [employer] showed  Positive KRAS G12C mutation PD-L1 expression was less than 1%  PRIOR THERAPY: 1) Status post SRS to the solitary brain metastasis under the care of Dr. Kathrynn Running. Completed on 06/23/21 2)  Treatment for the locally advanced disease in the chest with carboplatin for AUC of 2 and paclitaxel 45 Mg/M2.  Status post 6 cycles.  Last dose was given 07/25/2021. 3) SRS to the additional metastatic brain lesions on 09/28/22  CURRENT THERAPY: Systemic chemotherapy with carboplatin for AUC of 5, Alimta 500 Mg/M2 and Keytruda 200 Mg IV every 3 weeks.  First dose August 31, 2021. Status post 20 cycles. Starting from cycle #5, she will start Alimta and keytruda maintenance IV every 3 weeks.    INTERVAL HISTORY: Jill [name] 72 y.o. female returns to the clinic today for a follow-up visit.  The patient is feeling fairly well today without any concerning complaints. The patient is currently undergoing maintenance chemotherapy and immunotherapy. She is tolerating it fairly well besides stable fatigue. She denies any fever or chills. Denies any recent night sweats. Her appetite is improving somewhat. She denies any chest pain or hemoptysis. Of note, she still smokes 1 ppd and does not take her inhalers as prescribed by pulmonary medicine for her COPD. She has a baseline shortness of breath and cough which is stable. Her cough typically produces a lot of mucus. She does not use mucinex or cough suppressant because she wants to get the phlegm up. Denies any  nausea, vomiting, constipation, or diarrhea. She denies rashes and itching.  She mentions if she gets a B12 injection, she gets a full body rash if she does not take benadryl. Apparently she has been premedicating with benadryl at home prior to her appointments on days she is expected to have a B12 injection. She forgot she was supposed to get B12 today. She has a driver today. She is here for evaluation and repeat blood work before starting cycle #21      MEDICAL HISTORY: Past Medical History:  Diagnosis Date   Anxiety    ON PAXIL, XANAX   AVM (arteriovenous malformation) brain    s/p stent/coil   Blood transfusion    x 2   Cancer (HCC)    Left lung   COPD (chronic obstructive pulmonary disease) (HCC)    no inhaler, no oxygen   Coronary artery disease    Prior inferior MI with stent to RCA, s/p CABG in 2008   Depression    Dizziness    Dyspnea    with exertion, no oxygen   Fatigue    Foot injury 06/01/2017   right - RESOLVED, no longer an issue per patient 05/18/21   GERD (gastroesophageal reflux disease)    Headache(784.0)    UNRUPTURED CEREBRAL ANEURYSM   Hyperlipidemia    Hypertension    Hypothyroidism    Infected cyst of skin 09/18/2013   Left knee pain 06/05/2012   Lung mass    Left lung   Myocardial infarction (HCC)    Normal nuclear stress test Ju;y 2012  No ischemia. EF 70%; fixed defect involving septum, inferoseptal and inferior wall   Pain, dental 02/06/2017   Poor dental hygiene    Retroperitoneal bleeding    Following cardiac cath   Tobacco abuse    Urine discoloration 09/18/2013   Urine incontinence 07/06/2020   UTI (lower urinary tract infection) 10/16/2013    ALLERGIES:  is allergic to varenicline, ace inhibitors, prednisone, betalin 12 [vitamin b12], penicillins, and sulfa drugs cross reactors.  MEDICATIONS:  Current Outpatient Medications  Medication Sig Dispense Refill   acetaminophen (TYLENOL) 500 MG tablet Take 500 mg by mouth every 6 (six)  hours as needed.     amLODipine (NORVASC) 10 MG tablet Take 1 tablet by mouth once daily 90 tablet 1   amoxicillin-clavulanate (AUGMENTIN) 875-125 MG tablet Take 1 tablet by mouth 2 (two) times daily. 14 tablet 0   aspirin EC 81 MG tablet Take 1 tablet (81 mg total) by mouth daily. 90 tablet 2   atorvastatin (LIPITOR) 40 MG tablet Take 1 tablet by mouth once daily 90 tablet 1   azithromycin (ZITHROMAX Z-PAK) 250 MG tablet Use as instructed 6 each 0   Cholecalciferol (VITAMIN D3) 250 MCG (10000 UT) capsule Take 10,000 mcg by mouth daily.     diphenhydrAMINE HCl (BENADRYL ALLERGY PO) Take 25 mg by mouth at bedtime as needed (for sleep).     escitalopram (LEXAPRO) 10 MG tablet Take 1 tablet by mouth once daily 90 tablet 1   folic acid (FOLVITE) 1 MG tablet Take 1 tablet by mouth once daily 30 tablet 0   levothyroxine (SYNTHROID) 137 MCG tablet Take 1 tablet (137 mcg total) by mouth daily before breakfast. 30 tablet 2   lidocaine-prilocaine (EMLA) cream Apply 1 Application topically as needed. 30 g 2   losartan (COZAAR) 100 MG tablet Take 1 tablet by mouth once daily 90 tablet 1   metoprolol succinate (TOPROL-XL) 50 MG 24 hr tablet TAKE 1 TABLET BY MOUTH ONCE DAILY WITH A MEAL OR  IMMEDIATELY  FOLLOWING 90 tablet 1   Multiple Vitamins-Minerals (MULTIVITAMIN WITH MINERALS) tablet Take 1 tablet by mouth daily.     nitroGLYCERIN (NITROSTAT) 0.4 MG SL tablet Place 1 tablet (0.4 mg total) under the tongue every 5 (five) minutes as needed. For chest pain (Patient not taking: Reported on 10/26/2021) 25 tablet 6   Vitamin A 2400 MCG (8000 UT) TABS Take 2,400 mg by mouth daily.     No current facility-administered medications for this visit.    SURGICAL HISTORY:  Past Surgical History:  Procedure Laterality Date   CARDIAC CATHETERIZATION  01/02/2007   IT REVEALS MILD INFERIOR WALL HYPOKINESIS. THE EJECTION FRACTION IS AROUND 50%   COLONOSCOPY     CORONARY ARTERY BYPASS GRAFT  11/06/2006   LIMA to LAD,  SVG to DX, SVG to LCX & SVG to OM 1 & 2, and SVG to PD   CORONARY STENT PLACEMENT     Remote past stent to RCA   EYE SURGERY Right    cataracts removed   HEMORRHOID SURGERY  11/07/1987   IR IMAGING GUIDED PORT INSERTION  10/04/2022   UPPER GI ENDOSCOPY     VENTRICULOSTOMY  10/06/2011   Procedure: VENTRICULOSTOMY;  Surgeon: Carmela Hurt;  Location: [facility];  Service: Neurosurgery;  Laterality: Right;  Insertion of Ventriculostomy Catheter   VIDEO BRONCHOSCOPY WITH ENDOBRONCHIAL NAVIGATION Left 05/20/2021   Procedure: VIDEO BRONCHOSCOPY WITH ENDOBRONCHIAL NAVIGATION;  Surgeon: Josephine Igo, DO;  Location: [facility] OR;  Service:  Pulmonary;  Laterality: Left;   VIDEO BRONCHOSCOPY WITH ENDOBRONCHIAL ULTRASOUND Bilateral 05/20/2021   Procedure: VIDEO BRONCHOSCOPY WITH ENDOBRONCHIAL ULTRASOUND;  Surgeon: Josephine Igo, DO;  Location: [facility] OR;  Service: Pulmonary;  Laterality: Bilateral;   WISDOM TOOTH EXTRACTION      REVIEW OF SYSTEMS:   Constitutional: Positive for fatigue. Negative for chills and fever. HENT: Negative for mouth sores, nosebleeds, sore throat and trouble swallowing.   Eyes: Negative for eye problems and icterus.  Respiratory: Positive for stable shortness of breath with exertion and chronic cough.  Negative for hemoptysis and wheezing.   Cardiovascular: Negative for chest pain and leg swelling.  Gastrointestinal: Negative for abdominal pain, diarrhea, constipation, nausea and vomiting.  Genitourinary: Negative for bladder incontinence, difficulty urinating, dysuria, frequency and hematuria.   Musculoskeletal: Negative for back pain, gait problem, neck pain and neck stiffness.  Skin:Positive for itchy dry lesion on dorsal aspect of left hand.  Neurological: Negative for dizziness, extremity weakness, gait problem, light-headedness and seizures.  Hematological: Negative for adenopathy. Does not bruise/bleed easily.  Psychiatric/Behavioral: Negative for confusion, depression  and sleep disturbance. The patient is not nervous/anxious.   PHYSICAL EXAMINATION:  Blood pressure 136/65, pulse 81, temperature 97.8 F (36.6 C), temperature source Oral, resp. rate 16, weight 143 lb 14.4 oz (65.3 kg), SpO2 92 %.  ECOG PERFORMANCE STATUS: 1  Physical Exam  Constitutional: Oriented to person, place, and time and well-developed, well-nourished, and in no distress. HENT:  Head: Normocephalic and atraumatic.  Mouth/Throat: Oropharynx is clear and moist. No oropharyngeal exudate.  Eyes: Conjunctivae are normal. Right eye exhibits no discharge. Left eye exhibits no discharge. No scleral icterus.  Neck: Normal range of motion. Neck supple.  Cardiovascular: Normal rate, regular rhythm, normal heart sounds and intact distal pulses.   Pulmonary/Chest: Effort normal and breath sounds normal. No respiratory distress. No wheezes. No rales.  Abdominal: Soft. Bowel sounds are normal. Exhibits no distension and no mass. There is no tenderness.  Musculoskeletal: Normal range of motion. Exhibits no edema.  Lymphadenopathy:    No cervical adenopathy.  Neurological: Alert and oriented to person, place, and time. Exhibits normal muscle tone. Gait normal. Coordination normal.  Skin: Skin is warm and dry. No rash noted. Not diaphoretic. No erythema. No pallor.  Psychiatric: Mood, memory and judgment normal.  Vitals reviewed.  LABORATORY DATA: Lab Results  Component Value Date   WBC 6.4 11/22/2022   HGB 11.6 (L) 11/22/2022   HCT 34.0 (L) 11/22/2022   MCV 104.0 (H) 11/22/2022   PLT 320 11/22/2022      Chemistry      Component Value Date/Time   NA 140 11/22/2022 0939   NA 141 04/12/2021 1356   K 3.4 (L) 11/22/2022 0939   CL 103 11/22/2022 0939   CO2 32 11/22/2022 0939   BUN 8 11/22/2022 0939   BUN 13 04/12/2021 1356   CREATININE 0.93 11/22/2022 0939   CREATININE 0.99 08/29/2016 1130      Component Value Date/Time   CALCIUM 9.2 11/22/2022 0939   ALKPHOS 74 11/22/2022 0939    AST 27 11/22/2022 0939   ALT 16 11/22/2022 0939   BILITOT 0.3 11/22/2022 0939       RADIOGRAPHIC STUDIES:  CT Chest W Contrast  Result Date: 10/31/2022 CLINICAL DATA:  Non-small-cell lung cancer, left-sided. Metastatic disease evaluation. * Tracking Code: BO * EXAM: CT CHEST, ABDOMEN, AND PELVIS WITH CONTRAST TECHNIQUE: Multidetector CT imaging of the chest, abdomen and pelvis was performed following the standard protocol during  bolus administration of intravenous contrast. RADIATION DOSE REDUCTION: This exam was performed according to the departmental dose-optimization program which includes automated exposure control, adjustment of the mA and/or kV according to patient size and/or use of iterative reconstruction technique. CONTRAST:  OMNIPAQUE IOHEXOL 300 MG/ML  SOLN COMPARISON:  08/26/2022 FINDINGS: CT CHEST FINDINGS Cardiovascular: Right Port-A-Cath tip high right atrium. Advanced aortic and branch vessel atherosclerosis. Normal heart size, without pericardial effusion. Median sternotomy for CABG. No central pulmonary embolism, on this non-dedicated study. Mediastinum/Nodes: No supraclavicular adenopathy. No mediastinal or hilar adenopathy. Lungs/Pleura: Trace left pleural fluid is similar. Moderate centrilobular emphysema. Lateral right upper lobe interstitial thickening versus nodularity, similar at 4 mm on 27/5. 5 mm right upper lobe pulmonary nodule is unchanged on 40/5. Spiculated posterior left upper lobe pulmonary nodule measures 2.8 by 2.3 cm on 60/5 versus 3.0 x 2.3 cm when remeasured in a similar fashion on the prior exam, suggesting stability. Mild surrounding interstitial thickening is unchanged. The medial left apical reticulonodular opacity is significantly improved, nearly resolved. Musculoskeletal: No acute osseous abnormality. CT ABDOMEN PELVIS FINDINGS Hepatobiliary: Right hepatic lobe low-density lesions are too small to characterize, similar and favored to represent small  cysts. No suspicious liver lesion. Normal gallbladder. Again identified is a low-density structure within the porta hepatis including on 65/2 at maximally 2.3 cm. Pancreas: Normal, without mass or ductal dilatation. Spleen: Normal in size, without focal abnormality. Adrenals/Urinary Tract: Normal adrenal glands. Right renal too small to characterize lesions. Anterior interpolar left renal 2.9 cm cyst or minimally complex cyst. Exophytic posterior upper pole left renal lesion is similar in size at 1.6 cm on 61/2. Measures greater than fluid density. No hydronephrosis.  Normal urinary bladder. Stomach/Bowel: Portions of the gastric cardia and antrum appear thick walled, likely due to underdistention. Small posterior gastric diverticulum again identified. Colonic stool burden suggests constipation. Scattered colonic diverticula. Normal terminal ileum and appendix. Normal small bowel. Vascular/Lymphatic: Advanced aortic and branch vessel atherosclerosis. No abdominopelvic adenopathy. Reproductive: Normal uterus and adnexa. Other: No significant free fluid. No evidence of omental or peritoneal disease. Musculoskeletal: Disc bulges at L4-5 and L5-S1. IMPRESSION: 1. Similar left upper lobe spiculated nodule, likely treated primary bronchogenic carcinoma. 2. No new or progressive disease. The medial left upper lobe reticulonodular opacity on the prior exam has significantly improved and was likely infectious/inflammatory. 3. Similar trace left pleural fluid. 4. Similar size of an indeterminate upper pole left renal lesion which could represent a complex cyst or solid neoplasm. Recommend attention on follow-up. 5. Possible choledochal cyst in the porta hepatis, unchanged. 6. Aortic atherosclerosis (ICD10-I70.0) and emphysema (ICD10-J43.9). Electronically Signed   By: Jeronimo Greaves M.D.   On: 10/31/2022 18:31   CT Abdomen Pelvis W Contrast  Result Date: 10/31/2022 CLINICAL DATA:  Non-small-cell lung cancer, left-sided.  Metastatic disease evaluation. * Tracking Code: BO * EXAM: CT CHEST, ABDOMEN, AND PELVIS WITH CONTRAST TECHNIQUE: Multidetector CT imaging of the chest, abdomen and pelvis was performed following the standard protocol during bolus administration of intravenous contrast. RADIATION DOSE REDUCTION: This exam was performed according to the departmental dose-optimization program which includes automated exposure control, adjustment of the mA and/or kV according to patient size and/or use of iterative reconstruction technique. CONTRAST:  OMNIPAQUE IOHEXOL 300 MG/ML  SOLN COMPARISON:  08/26/2022 FINDINGS: CT CHEST FINDINGS Cardiovascular: Right Port-A-Cath tip high right atrium. Advanced aortic and branch vessel atherosclerosis. Normal heart size, without pericardial effusion. Median sternotomy for CABG. No central pulmonary embolism, on this non-dedicated study. Mediastinum/Nodes:  No supraclavicular adenopathy. No mediastinal or hilar adenopathy. Lungs/Pleura: Trace left pleural fluid is similar. Moderate centrilobular emphysema. Lateral right upper lobe interstitial thickening versus nodularity, similar at 4 mm on 27/5. 5 mm right upper lobe pulmonary nodule is unchanged on 40/5. Spiculated posterior left upper lobe pulmonary nodule measures 2.8 by 2.3 cm on 60/5 versus 3.0 x 2.3 cm when remeasured in a similar fashion on the prior exam, suggesting stability. Mild surrounding interstitial thickening is unchanged. The medial left apical reticulonodular opacity is significantly improved, nearly resolved. Musculoskeletal: No acute osseous abnormality. CT ABDOMEN PELVIS FINDINGS Hepatobiliary: Right hepatic lobe low-density lesions are too small to characterize, similar and favored to represent small cysts. No suspicious liver lesion. Normal gallbladder. Again identified is a low-density structure within the porta hepatis including on 65/2 at maximally 2.3 cm. Pancreas: Normal, without mass or ductal dilatation.  Spleen: Normal in size, without focal abnormality. Adrenals/Urinary Tract: Normal adrenal glands. Right renal too small to characterize lesions. Anterior interpolar left renal 2.9 cm cyst or minimally complex cyst. Exophytic posterior upper pole left renal lesion is similar in size at 1.6 cm on 61/2. Measures greater than fluid density. No hydronephrosis.  Normal urinary bladder. Stomach/Bowel: Portions of the gastric cardia and antrum appear thick walled, likely due to underdistention. Small posterior gastric diverticulum again identified. Colonic stool burden suggests constipation. Scattered colonic diverticula. Normal terminal ileum and appendix. Normal small bowel. Vascular/Lymphatic: Advanced aortic and branch vessel atherosclerosis. No abdominopelvic adenopathy. Reproductive: Normal uterus and adnexa. Other: No significant free fluid. No evidence of omental or peritoneal disease. Musculoskeletal: Disc bulges at L4-5 and L5-S1. IMPRESSION: 1. Similar left upper lobe spiculated nodule, likely treated primary bronchogenic carcinoma. 2. No new or progressive disease. The medial left upper lobe reticulonodular opacity on the prior exam has significantly improved and was likely infectious/inflammatory. 3. Similar trace left pleural fluid. 4. Similar size of an indeterminate upper pole left renal lesion which could represent a complex cyst or solid neoplasm. Recommend attention on follow-up. 5. Possible choledochal cyst in the porta hepatis, unchanged. 6. Aortic atherosclerosis (ICD10-I70.0) and emphysema (ICD10-J43.9). Electronically Signed   By: Abigail Miyamoto M.D.   On: 10/31/2022 18:31     ASSESSMENT/PLAN:  This is a very pleasant 72 year old Caucasian female diagnosed with stage IV (T2 a, N2, M1 B) non-small cell lung cancer, adenocarcinoma.  She presented with a left upper lobe lung mass in addition to left hilar and precarinal metastatic adenopathy as well as a small left lower lobe pulmonary nodule and a  solitary left frontal brain metastasis.  This was diagnosed in July 2022.  Her molecular studies show that she is positive for K-ras G12C mutation and her PD-L1 expression is negative.    She completed SRS to the solitary brain metastasis under the care of Dr. Tammi Klippel which was completed on 06/23/2021.  She had a repeat brain MRI performed on 09/15/2021 which shows a new 1 mm and 3 mm metastatic brain lesions.  She received SRS on 09/28/2021.    She completed concurrent chemoradiation for the locally advanced disease in the chest with carboplatin for an AUC of 2 and paclitaxel 45 mg per metered squared.  She is status post 6 cycles.  She had been tolerating treatment well without any concerning adverse side effects.   The patient is currently undergoing systemic chemotherapy with carboplatin AUC 5, Alimta 500 mg per metered squared, Keytruda 20 mg IV every 3 weeks.  She is status post 20 cycles.  She started  maintenance alimta and Keytruda starting from cycle #5.       Labs were reviewed.  Recommend that she proceed cycle #21 today scheduled.  I talked to Dr. Arbutus Ped about oral B12 daily at home vs. Continuing b12 injections in the clinic with benadryl premedication. He would prefer she receive her B12 every 9 weeks. Therefore, I reached out to pharmacy who will add benadryl 25 mg on days she receives B12. She has a driver today.   We will see her back for follow-up visit in 3 weeks for evaluation repeat blood work before undergoing cycle number 22.   The patient was advised to call immediately if she has any concerning symptoms in the interval. The patient voices understanding of current disease status and treatment options and is in agreement with the current care plan. All questions were answered. The patient knows to call the clinic with any problems, questions or concerns. We can certainly see the patient much sooner if necessary  No orders of the defined types were placed in this encounter.     The total time spent in the appointment was 20-29 minutes.   Selden Noteboom L Matti Minney, PA-C 11/22/22

## 2022-11-18 ENCOUNTER — Other Ambulatory Visit: Payer: Self-pay

## 2022-11-22 ENCOUNTER — Inpatient Hospital Stay: Payer: Medicare HMO | Attending: Internal Medicine | Admitting: Physician Assistant

## 2022-11-22 ENCOUNTER — Inpatient Hospital Stay: Payer: Medicare HMO

## 2022-11-22 ENCOUNTER — Other Ambulatory Visit: Payer: Self-pay | Admitting: Physician Assistant

## 2022-11-22 ENCOUNTER — Other Ambulatory Visit: Payer: Self-pay

## 2022-11-22 ENCOUNTER — Other Ambulatory Visit: Payer: Self-pay | Admitting: Family Medicine

## 2022-11-22 ENCOUNTER — Other Ambulatory Visit: Payer: Medicare HMO

## 2022-11-22 VITALS — BP 136/65 | HR 81 | Temp 97.8°F | Resp 16 | Wt 143.9 lb

## 2022-11-22 DIAGNOSIS — E785 Hyperlipidemia, unspecified: Secondary | ICD-10-CM | POA: Insufficient documentation

## 2022-11-22 DIAGNOSIS — M5136 Other intervertebral disc degeneration, lumbar region: Secondary | ICD-10-CM | POA: Diagnosis not present

## 2022-11-22 DIAGNOSIS — Z880 Allergy status to penicillin: Secondary | ICD-10-CM | POA: Insufficient documentation

## 2022-11-22 DIAGNOSIS — E039 Hypothyroidism, unspecified: Secondary | ICD-10-CM | POA: Insufficient documentation

## 2022-11-22 DIAGNOSIS — I700 Atherosclerosis of aorta: Secondary | ICD-10-CM | POA: Diagnosis not present

## 2022-11-22 DIAGNOSIS — C7931 Secondary malignant neoplasm of brain: Secondary | ICD-10-CM | POA: Diagnosis not present

## 2022-11-22 DIAGNOSIS — Z5111 Encounter for antineoplastic chemotherapy: Secondary | ICD-10-CM | POA: Insufficient documentation

## 2022-11-22 DIAGNOSIS — Z923 Personal history of irradiation: Secondary | ICD-10-CM | POA: Insufficient documentation

## 2022-11-22 DIAGNOSIS — I252 Old myocardial infarction: Secondary | ICD-10-CM | POA: Diagnosis not present

## 2022-11-22 DIAGNOSIS — Z79899 Other long term (current) drug therapy: Secondary | ICD-10-CM | POA: Insufficient documentation

## 2022-11-22 DIAGNOSIS — C3412 Malignant neoplasm of upper lobe, left bronchus or lung: Secondary | ICD-10-CM

## 2022-11-22 DIAGNOSIS — M5137 Other intervertebral disc degeneration, lumbosacral region: Secondary | ICD-10-CM | POA: Insufficient documentation

## 2022-11-22 DIAGNOSIS — Z79890 Hormone replacement therapy (postmenopausal): Secondary | ICD-10-CM | POA: Diagnosis not present

## 2022-11-22 DIAGNOSIS — Z8719 Personal history of other diseases of the digestive system: Secondary | ICD-10-CM | POA: Diagnosis not present

## 2022-11-22 DIAGNOSIS — Z95828 Presence of other vascular implants and grafts: Secondary | ICD-10-CM

## 2022-11-22 DIAGNOSIS — Z5112 Encounter for antineoplastic immunotherapy: Secondary | ICD-10-CM | POA: Insufficient documentation

## 2022-11-22 DIAGNOSIS — F17210 Nicotine dependence, cigarettes, uncomplicated: Secondary | ICD-10-CM | POA: Insufficient documentation

## 2022-11-22 DIAGNOSIS — Z882 Allergy status to sulfonamides status: Secondary | ICD-10-CM | POA: Insufficient documentation

## 2022-11-22 DIAGNOSIS — Z888 Allergy status to other drugs, medicaments and biological substances status: Secondary | ICD-10-CM | POA: Diagnosis not present

## 2022-11-22 DIAGNOSIS — Z87440 Personal history of urinary (tract) infections: Secondary | ICD-10-CM | POA: Insufficient documentation

## 2022-11-22 LAB — CBC WITH DIFFERENTIAL (CANCER CENTER ONLY)
Abs Immature Granulocytes: 0.01 10*3/uL (ref 0.00–0.07)
Basophils Absolute: 0.1 10*3/uL (ref 0.0–0.1)
Basophils Relative: 1 %
Eosinophils Absolute: 0.1 10*3/uL (ref 0.0–0.5)
Eosinophils Relative: 2 %
HCT: 34 % — ABNORMAL LOW (ref 36.0–46.0)
Hemoglobin: 11.6 g/dL — ABNORMAL LOW (ref 12.0–15.0)
Immature Granulocytes: 0 %
Lymphocytes Relative: 25 %
Lymphs Abs: 1.6 10*3/uL (ref 0.7–4.0)
MCH: 35.5 pg — ABNORMAL HIGH (ref 26.0–34.0)
MCHC: 34.1 g/dL (ref 30.0–36.0)
MCV: 104 fL — ABNORMAL HIGH (ref 80.0–100.0)
Monocytes Absolute: 0.8 10*3/uL (ref 0.1–1.0)
Monocytes Relative: 12 %
Neutro Abs: 3.9 10*3/uL (ref 1.7–7.7)
Neutrophils Relative %: 60 %
Platelet Count: 320 10*3/uL (ref 150–400)
RBC: 3.27 MIL/uL — ABNORMAL LOW (ref 3.87–5.11)
RDW: 15.2 % (ref 11.5–15.5)
WBC Count: 6.4 10*3/uL (ref 4.0–10.5)
nRBC: 0 % (ref 0.0–0.2)

## 2022-11-22 LAB — CMP (CANCER CENTER ONLY)
ALT: 16 U/L (ref 0–44)
AST: 27 U/L (ref 15–41)
Albumin: 3.6 g/dL (ref 3.5–5.0)
Alkaline Phosphatase: 74 U/L (ref 38–126)
Anion gap: 5 (ref 5–15)
BUN: 8 mg/dL (ref 8–23)
CO2: 32 mmol/L (ref 22–32)
Calcium: 9.2 mg/dL (ref 8.9–10.3)
Chloride: 103 mmol/L (ref 98–111)
Creatinine: 0.93 mg/dL (ref 0.44–1.00)
GFR, Estimated: >60 mL/min (ref >60)
Glucose, Bld: 87 mg/dL (ref 70–99)
Potassium: 3.4 mmol/L — ABNORMAL LOW (ref 3.5–5.1)
Sodium: 140 mmol/L (ref 135–145)
Total Bilirubin: 0.3 mg/dL (ref 0.3–1.2)
Total Protein: 6.8 g/dL (ref 6.5–8.1)

## 2022-11-22 LAB — TSH: TSH: 27.237 u[IU]/mL — ABNORMAL HIGH (ref 0.350–4.500)

## 2022-11-22 MED ORDER — CYANOCOBALAMIN 1000 MCG/ML IJ SOLN
1000.0000 ug | Freq: Once | INTRAMUSCULAR | Status: AC
  Administered 2022-11-22: 1000 ug via INTRAMUSCULAR
  Filled 2022-11-22: qty 1

## 2022-11-22 MED ORDER — SODIUM CHLORIDE 0.9 % IV SOLN: Freq: Once | INTRAVENOUS | Status: AC

## 2022-11-22 MED ORDER — PROCHLORPERAZINE MALEATE 10 MG PO TABS
10.0000 mg | ORAL_TABLET | Freq: Once | ORAL | Status: AC
  Administered 2022-11-22: 10 mg via ORAL
  Filled 2022-11-22: qty 1

## 2022-11-22 MED ORDER — SODIUM CHLORIDE 0.9% FLUSH
10.0000 mL | INTRAVENOUS | Status: AC | PRN
  Administered 2022-11-22: 10 mL

## 2022-11-22 MED ORDER — DIPHENHYDRAMINE HCL 25 MG PO CAPS
25.0000 mg | ORAL_CAPSULE | Freq: Once | ORAL | Status: AC
  Administered 2022-11-22: 25 mg via ORAL
  Filled 2022-11-22: qty 1

## 2022-11-22 MED ORDER — LEVOTHYROXINE SODIUM 150 MCG PO TABS: 150.0000 ug | ORAL_TABLET | Freq: Every day | ORAL | 2 refills | Status: DC

## 2022-11-22 MED ORDER — SODIUM CHLORIDE 0.9 % IV SOLN
200.0000 mg | Freq: Once | INTRAVENOUS | Status: AC
  Administered 2022-11-22: 200 mg via INTRAVENOUS
  Filled 2022-11-22: qty 200

## 2022-11-22 MED ORDER — SODIUM CHLORIDE 0.9 % IV SOLN
500.0000 mg/m2 | Freq: Once | INTRAVENOUS | Status: AC
  Administered 2022-11-22: 900 mg via INTRAVENOUS
  Filled 2022-11-22: qty 20

## 2022-11-23 ENCOUNTER — Telehealth: Payer: Self-pay

## 2022-11-23 ENCOUNTER — Telehealth: Payer: Self-pay | Admitting: Medical Oncology

## 2022-11-23 ENCOUNTER — Telehealth: Payer: Self-pay | Admitting: Physician Assistant

## 2022-11-23 LAB — T4: T4, Total: 10.1 ug/dL (ref 4.5–12.0)

## 2022-11-23 NOTE — Telephone Encounter (Signed)
I called the patient to let her know I increased the dose of her thyroid medication.

## 2022-11-23 NOTE — Telephone Encounter (Signed)
Patient calls nurse line requesting an apt.   Patient reports she has a very painful cyst located on her back. She reports she noticed this a few days ago. She reports the area has been very red and sore.   She denies any fevers, chills, drainage or madarous odor.   ED precautions given.   Patient schedule for tomorrow for evaluation.

## 2022-11-23 NOTE — Telephone Encounter (Signed)
Pt is scheduled for an appt tomorrow @310  with Dr.Sun

## 2022-11-23 NOTE — Telephone Encounter (Signed)
-----  Message from Si Gaul, MD sent at 11/22/2022  3:23 PM EST ----- We may need to increase her dose of the levothyroxine by 1 level.  Thank you ----- Message ----- From: Leory Plowman, Lab In [place] Sent: 11/22/2022  10:00 AM EST To: Si Gaul, MD

## 2022-11-23 NOTE — Telephone Encounter (Signed)
"  Cyst noticed yesterday". She is seeing PCP tomorrow for same to " have it drained"

## 2022-11-24 ENCOUNTER — Ambulatory Visit (INDEPENDENT_AMBULATORY_CARE_PROVIDER_SITE_OTHER): Payer: Medicare HMO | Admitting: Family Medicine

## 2022-11-24 ENCOUNTER — Other Ambulatory Visit: Payer: Self-pay | Admitting: Radiation Therapy

## 2022-11-24 ENCOUNTER — Other Ambulatory Visit: Payer: Self-pay

## 2022-11-24 VITALS — BP 122/68 | HR 73 | Ht 67.0 in | Wt 143.0 lb

## 2022-11-24 DIAGNOSIS — L0291 Cutaneous abscess, unspecified: Secondary | ICD-10-CM | POA: Diagnosis not present

## 2022-11-24 DIAGNOSIS — C7931 Secondary malignant neoplasm of brain: Secondary | ICD-10-CM

## 2022-11-24 NOTE — Progress Notes (Signed)
    SUBJECTIVE:   CHIEF COMPLAINT / HPI:  Chief Complaint  Patient presents with   cyst on back    Patient called the nurse line yesterday reporting a painful cyst on her back noticed a few days prior which has been red and sore.  Denies fever, chills, drainage.  She takes ASA 81 mg but no other blood thinners.  She has had similar abscesses to her back before (previously upper back) that have had to be I&D'd before.  PERTINENT  PMH / PSH: COPD, depression, HTN, HLD, hypothyroidism, lung cancer, tobacco abuse  Patient Care Team: [name], MD as PCP - General (Family Medicine)   OBJECTIVE:   BP 122/68   Pulse 73   Ht 5\' 7"  (1.702 m)   Wt 143 lb (64.9 kg)   SpO2 90%   BMI 22.40 kg/m   Physical Exam Constitutional:      General: She is not in acute distress.    Appearance: Normal appearance. She is normal weight.  Pulmonary:     Effort: Pulmonary effort is normal.  Skin:    Comments: Approximately 4x4 cm erythematous and fluctuant abscess to left lateral back with punctate opening  Neurological:     Mental Status: She is alert and oriented to person, place, and time. Mental status is at baseline.  Psychiatric:        Mood and Affect: Mood normal.        Behavior: Behavior normal.      ASSESSMENT/PLAN:   1. Abscess Incision and drainage performed today. No signs of cellulitis or systemic infection. Defer systemic abx given source control. Conservative treatment with Tylenol as needed for discomfort. Discussed signs of cellulitis/infection and return precautions provided.   Diagnosis: abscess - Location: Left lateral Back Procedure: Incision & drainage Informed consent:  Discussed risks (bleeding, infection, scarring) and benefits of the procedure, as well as the alternatives.  Informed consent was obtained. Anesthesia: 5 cc Lidocaine with Epi The area was prepared and draped in a standard fashion. The lesion drained pus, white, chalky, cyst material, clear,  mucoid fluid, and blood. A large amount of fluid was drained. The lesion was multiloculated. Pieces of cyst wall were extracted. The patient tolerated the procedure well. Incision closed with a single steri-strip.  The patient was instructed on post-op care.  Sharion Settler, [place]

## 2022-11-24 NOTE — Patient Instructions (Addendum)
It was nice seeing you today!  We drained the abscess to your back today! I do not think you need antibiotics. Keep the bandage dry for 24 hours. Return for streaking redness, fevers, or worsening pain. You can take Tylenol as needed for pain.  [facility] [phone]  --  Make sure to check out at the front desk before you leave today.  Please arrive at least 15 minutes prior to your scheduled appointments.  If you had blood work today, I will send you a MyChart message or a letter if results are normal. Otherwise, I will give you a call.  If you had a referral placed, they will call you to set up an appointment. Please give [place] a call if you don't hear back in the next 2 weeks.  If you need additional refills before your next appointment, please call your pharmacy first.

## 2022-11-26 ENCOUNTER — Other Ambulatory Visit: Payer: Self-pay

## 2022-12-06 ENCOUNTER — Telehealth: Payer: Self-pay | Admitting: Radiation Therapy

## 2022-12-06 NOTE — Telephone Encounter (Signed)
I left a detailed voice message about Ms. Daloia's upcoming brain MRI and telephone follow-up with Ashlyn in March. My contact information was included in case she has questions or a conflict.   Mont Dutton R.T.(R)(T) Radiation Special Procedures Navigator

## 2022-12-13 ENCOUNTER — Inpatient Hospital Stay: Payer: Medicare HMO

## 2022-12-13 ENCOUNTER — Other Ambulatory Visit: Payer: Medicare HMO

## 2022-12-13 ENCOUNTER — Ambulatory Visit: Payer: Medicare HMO

## 2022-12-13 ENCOUNTER — Other Ambulatory Visit: Payer: Self-pay

## 2022-12-13 ENCOUNTER — Encounter: Payer: Self-pay | Admitting: Internal Medicine

## 2022-12-13 ENCOUNTER — Inpatient Hospital Stay: Payer: Medicare HMO | Attending: Internal Medicine | Admitting: Internal Medicine

## 2022-12-13 VITALS — BP 142/61 | HR 67 | Temp 97.3°F

## 2022-12-13 DIAGNOSIS — E039 Hypothyroidism, unspecified: Secondary | ICD-10-CM | POA: Insufficient documentation

## 2022-12-13 DIAGNOSIS — Z888 Allergy status to other drugs, medicaments and biological substances status: Secondary | ICD-10-CM | POA: Diagnosis not present

## 2022-12-13 DIAGNOSIS — Z8719 Personal history of other diseases of the digestive system: Secondary | ICD-10-CM | POA: Insufficient documentation

## 2022-12-13 DIAGNOSIS — C3412 Malignant neoplasm of upper lobe, left bronchus or lung: Secondary | ICD-10-CM | POA: Diagnosis not present

## 2022-12-13 DIAGNOSIS — Z882 Allergy status to sulfonamides status: Secondary | ICD-10-CM | POA: Diagnosis not present

## 2022-12-13 DIAGNOSIS — Z923 Personal history of irradiation: Secondary | ICD-10-CM | POA: Insufficient documentation

## 2022-12-13 DIAGNOSIS — Z79631 Long term (current) use of antimetabolite agent: Secondary | ICD-10-CM | POA: Diagnosis not present

## 2022-12-13 DIAGNOSIS — Z79899 Other long term (current) drug therapy: Secondary | ICD-10-CM | POA: Diagnosis not present

## 2022-12-13 DIAGNOSIS — Z5111 Encounter for antineoplastic chemotherapy: Secondary | ICD-10-CM | POA: Diagnosis present

## 2022-12-13 DIAGNOSIS — R5383 Other fatigue: Secondary | ICD-10-CM | POA: Diagnosis not present

## 2022-12-13 DIAGNOSIS — I252 Old myocardial infarction: Secondary | ICD-10-CM | POA: Diagnosis not present

## 2022-12-13 DIAGNOSIS — E785 Hyperlipidemia, unspecified: Secondary | ICD-10-CM | POA: Insufficient documentation

## 2022-12-13 DIAGNOSIS — Z79620 Long term (current) use of immunosuppressive biologic: Secondary | ICD-10-CM | POA: Diagnosis not present

## 2022-12-13 DIAGNOSIS — R0609 Other forms of dyspnea: Secondary | ICD-10-CM | POA: Insufficient documentation

## 2022-12-13 DIAGNOSIS — Z5112 Encounter for antineoplastic immunotherapy: Secondary | ICD-10-CM | POA: Insufficient documentation

## 2022-12-13 DIAGNOSIS — Z880 Allergy status to penicillin: Secondary | ICD-10-CM | POA: Diagnosis not present

## 2022-12-13 DIAGNOSIS — Z95828 Presence of other vascular implants and grafts: Secondary | ICD-10-CM | POA: Insufficient documentation

## 2022-12-13 DIAGNOSIS — C7931 Secondary malignant neoplasm of brain: Secondary | ICD-10-CM | POA: Diagnosis not present

## 2022-12-13 DIAGNOSIS — Z87440 Personal history of urinary (tract) infections: Secondary | ICD-10-CM | POA: Diagnosis not present

## 2022-12-13 LAB — CBC WITH DIFFERENTIAL (CANCER CENTER ONLY)
Abs Immature Granulocytes: 0.01 10*3/uL (ref 0.00–0.07)
Basophils Absolute: 0.1 10*3/uL (ref 0.0–0.1)
Basophils Relative: 1 %
Eosinophils Absolute: 0.1 10*3/uL (ref 0.0–0.5)
Eosinophils Relative: 2 %
HCT: 33.9 % — ABNORMAL LOW (ref 36.0–46.0)
Hemoglobin: 11.3 g/dL — ABNORMAL LOW (ref 12.0–15.0)
Immature Granulocytes: 0 %
Lymphocytes Relative: 28 %
Lymphs Abs: 1.3 10*3/uL (ref 0.7–4.0)
MCH: 34.5 pg — ABNORMAL HIGH (ref 26.0–34.0)
MCHC: 33.3 g/dL (ref 30.0–36.0)
MCV: 103.4 fL — ABNORMAL HIGH (ref 80.0–100.0)
Monocytes Absolute: 0.6 10*3/uL (ref 0.1–1.0)
Monocytes Relative: 13 %
Neutro Abs: 2.7 10*3/uL (ref 1.7–7.7)
Neutrophils Relative %: 56 %
Platelet Count: 406 10*3/uL — ABNORMAL HIGH (ref 150–400)
RBC: 3.28 MIL/uL — ABNORMAL LOW (ref 3.87–5.11)
RDW: 15.1 % (ref 11.5–15.5)
WBC Count: 4.8 10*3/uL (ref 4.0–10.5)
nRBC: 0 % (ref 0.0–0.2)

## 2022-12-13 LAB — CMP (CANCER CENTER ONLY)
ALT: 16 U/L (ref 0–44)
AST: 26 U/L (ref 15–41)
Albumin: 3.5 g/dL (ref 3.5–5.0)
Alkaline Phosphatase: 75 U/L (ref 38–126)
Anion gap: 3 — ABNORMAL LOW (ref 5–15)
BUN: 10 mg/dL (ref 8–23)
CO2: 33 mmol/L — ABNORMAL HIGH (ref 22–32)
Calcium: 9.2 mg/dL (ref 8.9–10.3)
Chloride: 103 mmol/L (ref 98–111)
Creatinine: 0.94 mg/dL (ref 0.44–1.00)
GFR, Estimated: >60 mL/min (ref >60)
Glucose, Bld: 92 mg/dL (ref 70–99)
Potassium: 3.3 mmol/L — ABNORMAL LOW (ref 3.5–5.1)
Sodium: 139 mmol/L (ref 135–145)
Total Bilirubin: 0.4 mg/dL (ref 0.3–1.2)
Total Protein: 6.6 g/dL (ref 6.5–8.1)

## 2022-12-13 MED ORDER — HEPARIN SOD (PORK) LOCK FLUSH 100 UNIT/ML IV SOLN
500.0000 [IU] | Freq: Once | INTRAVENOUS | Status: AC | PRN
  Administered 2022-12-13: 500 [IU]

## 2022-12-13 MED ORDER — PROCHLORPERAZINE MALEATE 10 MG PO TABS: 10.0000 mg | ORAL_TABLET | Freq: Four times a day (QID) | ORAL | 0 refills | Status: DC | PRN

## 2022-12-13 MED ORDER — SODIUM CHLORIDE 0.9 % IV SOLN
500.0000 mg/m2 | Freq: Once | INTRAVENOUS | Status: AC
  Administered 2022-12-13: 900 mg via INTRAVENOUS
  Filled 2022-12-13: qty 20

## 2022-12-13 MED ORDER — SODIUM CHLORIDE 0.9% FLUSH
10.0000 mL | Freq: Once | INTRAVENOUS | Status: AC
  Administered 2022-12-13: 10 mL

## 2022-12-13 MED ORDER — PROCHLORPERAZINE MALEATE 10 MG PO TABS
10.0000 mg | ORAL_TABLET | Freq: Once | ORAL | Status: AC
  Administered 2022-12-13: 10 mg via ORAL
  Filled 2022-12-13: qty 1

## 2022-12-13 MED ORDER — SODIUM CHLORIDE 0.9 % IV SOLN
200.0000 mg | Freq: Once | INTRAVENOUS | Status: AC
  Administered 2022-12-13: 200 mg via INTRAVENOUS
  Filled 2022-12-13: qty 8

## 2022-12-13 MED ORDER — SODIUM CHLORIDE 0.9 % IV SOLN: Freq: Once | INTRAVENOUS | Status: AC

## 2022-12-13 MED ORDER — SODIUM CHLORIDE 0.9% FLUSH
10.0000 mL | INTRAVENOUS | Status: DC | PRN
  Administered 2022-12-13: 10 mL

## 2022-12-13 NOTE — Patient Instructions (Signed)
[facility]  Discharge Instructions: Thank you for choosing [facility] to provide your oncology and hematology care.   If you have a lab appointment with the [facility], please go directly to the [facility] and check in at the registration area.   Wear comfortable clothing and clothing appropriate for easy access to any Portacath or PICC line.   We strive to give you quality time with your provider. You may need to reschedule your appointment if you arrive late (15 or more minutes).  Arriving late affects you and other patients whose appointments are after yours.  Also, if you miss three or more appointments without notifying the office, you may be dismissed from the clinic at the provider's discretion.      For prescription refill requests, have your pharmacy contact our office and allow 72 hours for refills to be completed.    Today you received the following chemotherapy and/or immunotherapy agents: Keytruda, Alimta.       To help prevent nausea and vomiting after your treatment, we encourage you to take your nausea medication as directed.  BELOW ARE SYMPTOMS THAT SHOULD BE REPORTED IMMEDIATELY: *FEVER GREATER THAN 100.4 F (38 C) OR HIGHER *CHILLS OR SWEATING *NAUSEA AND VOMITING THAT IS NOT CONTROLLED WITH YOUR NAUSEA MEDICATION *UNUSUAL SHORTNESS OF BREATH *UNUSUAL BRUISING OR BLEEDING *URINARY PROBLEMS (pain or burning when urinating, or frequent urination) *BOWEL PROBLEMS (unusual diarrhea, constipation, pain near the anus) TENDERNESS IN MOUTH AND THROAT WITH OR WITHOUT PRESENCE OF ULCERS (sore throat, sores in mouth, or a toothache) UNUSUAL RASH, SWELLING OR PAIN  UNUSUAL VAGINAL DISCHARGE OR ITCHING   Items with * indicate a potential emergency and should be followed up as soon as possible or go to the Emergency Department if any problems should occur.  Please show the CHEMOTHERAPY ALERT CARD or IMMUNOTHERAPY ALERT CARD  at check-in to the Emergency Department and triage nurse.  Should you have questions after your visit or need to cancel or reschedule your appointment, please contact [facility]  Dept: [phone]  and follow the prompts.  Office hours are 8:00 a.m. to 4:30 p.m. Monday - Friday. Please note that voicemails left after 4:00 p.m. may not be returned until the following business day.  We are closed weekends and major holidays. You have access to a nurse at all times for urgent questions. Please call the main number to the clinic Dept: [phone] and follow the prompts.   For any non-urgent questions, you may also contact your provider using MyChart. We now offer e-Visits for anyone 34 and older to request care online for non-urgent symptoms. For details visit [email or web address].   Also download the MyChart app! Go to the app store, search "MyChart", open the app, select [facility], and log in with your MyChart username and password.

## 2022-12-13 NOTE — Progress Notes (Signed)
[facility] [phone]   [phone]  OFFICE PROGRESS NOTE  [name], MD [place] [address]  DIAGNOSIS: Stage IV (T2 a, N2, M1b) non-small cell lung cancer, adenocarcinoma presented with left upper lobe lung mass in addition to left hilar and precarinal metastatic adenopathy and small left lower lobe pulmonary nodule in addition to solitary left frontal brain metastasis diagnosed in July 2022.  Molecular studies by [facility] showed  Positive KRAS G12C mutation PD-L1 expression was less than 1%   PRIOR THERAPY:  1) Status post SRS to the solitary brain metastasis. 20 Treatment for the locally advanced disease in the chest with carboplatin for AUC of 2 and paclitaxel 45 Mg/M2.  Status post 6 cycles.  Last dose was given 07/25/2021.  CURRENT THERAPY: Systemic chemotherapy with carboplatin for AUC of 5, Alimta 500 Mg/M2 and Keytruda 200 Mg IV every 3 weeks.  First dose August 31, 2021.  Status post 21 cycles.  Starting from cycle #5 the patient will be on maintenance treatment with Alimta and Keytruda every 3 weeks.  INTERVAL HISTORY: Jill [name] 72 y.o. female returns to the clinic today for follow-up visit accompanied by her daughter, [name].  The patient is feeling fine today with no concerning complaints except for mild fatigue.  She denied having any current chest pain, shortness of breath, cough or hemoptysis.  She has no nausea, vomiting, diarrhea or constipation.  She has no headache or visual changes.  She had a skin cyst on the back that was drained by her primary care physician.  The patient is here today for evaluation before starting cycle #22 of her treatment with maintenance Alimta and Keytruda.  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 EF 70%; fixed defect involving septum, inferoseptal and inferior wall   Pain, dental 02/06/2017   Poor dental hygiene    Retroperitoneal bleeding    Following cardiac cath   Tobacco abuse    Urine discoloration 09/18/2013   Urine incontinence 07/06/2020   UTI (lower urinary tract infection) 10/16/2013    ALLERGIES:  is allergic to varenicline, ace inhibitors, prednisone, betalin 12 [vitamin b12], penicillins, and sulfa drugs cross reactors.  MEDICATIONS:  Current Outpatient Medications  Medication Sig Dispense Refill   acetaminophen (TYLENOL) 500 MG tablet Take 500 mg by mouth every 6 (six) hours as needed.     amLODipine (NORVASC) 10 MG  tablet TAKE 1 TABLET BY MOUTH ONCE DAILY . APPOINTMENT REQUIRED FOR FUTURE REFILLS 90 tablet 0   aspirin EC 81 MG tablet Take 1 tablet (81 mg total) by mouth daily. 90 tablet 2   atorvastatin (LIPITOR) 40 MG tablet Take 1 tablet by mouth once daily 90 tablet 1   Cholecalciferol (VITAMIN D3) 250 MCG (10000 UT) capsule Take 10,000 mcg by mouth daily.     diphenhydrAMINE HCl (BENADRYL ALLERGY PO) Take 25 mg by mouth at bedtime as needed (for sleep).     escitalopram (LEXAPRO) 10 MG tablet Take 1 tablet by mouth once daily 90 tablet 1   folic acid (FOLVITE) 1 MG tablet Take 1 tablet by mouth once daily 30 tablet 0    levothyroxine (SYNTHROID) 150 MCG tablet Take 1 tablet (150 mcg total) by mouth daily before breakfast. 30 tablet 2   lidocaine-prilocaine (EMLA) cream Apply 1 Application topically as needed. 30 g 2   losartan (COZAAR) 100 MG tablet Take 1 tablet by mouth once daily 90 tablet 1   metoprolol succinate (TOPROL-XL) 50 MG 24 hr tablet TAKE 1 TABLET BY MOUTH ONCE DAILY WITH A MEAL OR  IMMEDIATELY  FOLLOWING 90 tablet 1   Multiple Vitamins-Minerals (MULTIVITAMIN WITH MINERALS) tablet Take 1 tablet by mouth daily.     nitroGLYCERIN (NITROSTAT) 0.4 MG SL tablet Place 1 tablet (0.4 mg total) under the tongue every 5 (five) minutes as needed. For chest pain (Patient not taking: Reported on 10/26/2021) 25 tablet 6   Vitamin A 2400 MCG (8000 UT) TABS Take 2,400 mg by mouth daily.     No current facility-administered medications for this visit.    SURGICAL HISTORY:  Past Surgical History:  Procedure Laterality Date   CARDIAC CATHETERIZATION  01/02/2007   IT REVEALS MILD INFERIOR WALL HYPOKINESIS. THE EJECTION FRACTION IS AROUND 50%   COLONOSCOPY     CORONARY ARTERY BYPASS GRAFT  11/06/2006   LIMA to LAD, SVG to DX, SVG to LCX & SVG to OM 1 & 2, and SVG to PD   CORONARY STENT PLACEMENT     Remote past stent to RCA   EYE SURGERY Right    cataracts removed   HEMORRHOID SURGERY  11/07/1987   IR IMAGING GUIDED PORT INSERTION  10/04/2022   UPPER GI ENDOSCOPY     VENTRICULOSTOMY  10/06/2011   Procedure: VENTRICULOSTOMY;  Surgeon: Winfield Cunas;  Location: [facility];  Service: Neurosurgery;  Laterality: Right;  Insertion of Ventriculostomy Catheter   VIDEO BRONCHOSCOPY WITH ENDOBRONCHIAL NAVIGATION Left 05/20/2021   Procedure: VIDEO BRONCHOSCOPY WITH ENDOBRONCHIAL NAVIGATION;  Surgeon: Garner Nash, DO;  Location: [facility];  Service: Pulmonary;  Laterality: Left;   VIDEO BRONCHOSCOPY WITH ENDOBRONCHIAL ULTRASOUND Bilateral 05/20/2021   Procedure: VIDEO BRONCHOSCOPY WITH ENDOBRONCHIAL ULTRASOUND;   Surgeon: Garner Nash, DO;  Location: [facility];  Service: Pulmonary;  Laterality: Bilateral;   WISDOM TOOTH EXTRACTION      REVIEW OF SYSTEMS:  A comprehensive review of systems was negative except for: Constitutional: positive for fatigue   PHYSICAL EXAMINATION: General appearance: alert, cooperative, fatigued, and no distress Head: Normocephalic, without obvious abnormality, atraumatic Neck: no adenopathy, no JVD, supple, symmetrical, trachea midline, and thyroid not enlarged, symmetric, no tenderness/mass/nodules Lymph nodes: Cervical, supraclavicular, and axillary nodes normal. Resp: clear to auscultation bilaterally Back: symmetric, no curvature. ROM normal. No CVA tenderness. Cardio: regular rate and rhythm, S1, S2 normal, no murmur, click, rub or gallop GI: soft, non-tender; bowel sounds normal; no masses,  no organomegaly Extremities: extremities normal, atraumatic, no cyanosis or edema  ECOG PERFORMANCE STATUS: 1 - Symptomatic but completely ambulatory  Blood pressure (!) 142/61, pulse 67, temperature (!) 97.3 F (36.3 C), temperature source Oral, SpO2 93 %.  LABORATORY DATA: Lab Results  Component Value Date   WBC 4.8 12/13/2022   HGB 11.3 (L) 12/13/2022   HCT 33.9 (L) 12/13/2022   MCV 103.4 (H) 12/13/2022   PLT 406 (H) 12/13/2022      Chemistry      Component Value Date/Time   NA 140 11/22/2022 0939   NA 141 04/12/2021 1356   K 3.4 (L) 11/22/2022 0939   CL 103 11/22/2022 0939   CO2 32 11/22/2022 0939   BUN 8 11/22/2022 0939   BUN 13 04/12/2021 1356   CREATININE 0.93 11/22/2022 0939   CREATININE 0.99 08/29/2016 1130      Component Value Date/Time   CALCIUM 9.2 11/22/2022 0939   ALKPHOS 74 11/22/2022 0939   AST 27 11/22/2022 0939   ALT 16 11/22/2022 0939   BILITOT 0.3 11/22/2022 0939       RADIOGRAPHIC STUDIES: No results found.  ASSESSMENT AND PLAN: This is a very pleasant 72 years old white female diagnosed with a stage IV (T2 a, N2, M1 B)  non-small cell lung cancer, adenocarcinoma presented with left upper lobe lung mass in addition to left hilar and precarinal metastatic adenopathy as well as small left lower lobe pulmonary nodule and solitary left frontal brain metastasis diagnosed in July 2022.  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 The patient is status post SRS to the solitary brain metastasis. She underwent a course of concurrent chemoradiation to the locally advanced disease in the chest with carboplatin for AUC of 2 and paclitaxel 45 Mg/M2 status post 7 cycles.  The patient has been tolerating her treatment well with no concerning adverse effects. Her scan showed mild improvement of her disease with no concerning findings for progression. Technically the patient has a stage IV lung cancer with brain metastasis  The patient is currently undergoing systemic chemotherapy with carboplatin for AUC of 5, Alimta 500 Mg/M2 and Keytruda 200 Mg IV every 3 weeks status post 21 cycles.  Starting from cycle #5 the patient will be on maintenance treatment with Alimta and Keytruda every 3 weeks.   The patient has been tolerating this treatment well with no concerning adverse effects except for mild fatigue. I recommended her to proceed with cycle #22 today as planned. I will see her back for follow-up visit in 3 weeks for evaluation before the next cycle of her treatment. The patient was advised to call immediately if she has any other concerning symptoms in the interval. The patient voices understanding of current disease status and treatment options and is in agreement with the current care plan. The total time spent in the appointment was 20 minutes.  All questions were answered. The patient knows to call the clinic with any problems, questions or concerns. We can certainly see the patient much sooner if necessary.  Disclaimer: This note was dictated with voice recognition software.  Similar sounding words can inadvertently be transcribed and may not be corrected upon review.

## 2022-12-17 NOTE — Patient Instructions (Signed)

## 2022-12-17 NOTE — Progress Notes (Unsigned)
I connected with  Jill [name] on 12/18/2022 by a audio enabled telemedicine application and verified that I am speaking with the correct person using two identifiers.  Patient Location: Home  Provider Location: Home Office  I discussed the limitations of evaluation and management by telemedicine. The patient expressed understanding and agreed to proceed.  Subjective:   Jill [name] is a 72 y.o. female who presents for an Initial Medicare Annual Wellness Visit.  Review of Systems    Per HPI unless specifically indicated below.  Cardiac Risk Factors include: advanced age (>67men, >63 women);female gender, Hypertension, CAD, and Hyperlipidemia.          Objective:       12/13/2022   10:51 AM 11/24/2022    3:33 PM 11/22/2022    9:53 AM  Vitals with BMI  Height  5\' 7"    Weight  143 lbs 143 lbs 14 oz  BMI  22.39 22.53  Systolic 142 122 098  Diastolic 61 68 65  Pulse 67 73 81    There were no vitals filed for this visit. There is no height or weight on file to calculate BMI.     12/13/2022   10:04 AM 11/24/2022    3:35 PM 10/17/2022    1:25 PM 10/11/2022   10:56 AM 10/04/2022   12:39 PM 09/20/2022    9:30 AM 08/30/2022   11:34 AM  Advanced Directives  Does Patient Have a Medical Advance Directive? No No No No No No No  Would patient like information on creating a medical advance directive? No - Patient declined No - Patient declined  No - Patient declined No - Patient declined No - Patient declined No - Patient declined    Current Medications (verified) Outpatient Encounter Medications as of 12/18/2022  Medication Sig   acetaminophen (TYLENOL) 500 MG tablet Take 500 mg by mouth every 6 (six) hours as needed.   amLODipine (NORVASC) 10 MG tablet TAKE 1 TABLET BY MOUTH ONCE DAILY . APPOINTMENT REQUIRED FOR FUTURE REFILLS   aspirin EC 81 MG tablet Take 1 tablet (81 mg total) by mouth daily.   atorvastatin (LIPITOR) 40 MG tablet Take 1 tablet by mouth once daily    Cholecalciferol (VITAMIN D3) 250 MCG (10000 UT) capsule Take 10,000 mcg by mouth daily.   diphenhydrAMINE HCl (BENADRYL ALLERGY PO) Take 25 mg by mouth at bedtime as needed (for sleep).   escitalopram (LEXAPRO) 10 MG tablet Take 1 tablet by mouth once daily   folic acid (FOLVITE) 1 MG tablet Take 1 tablet by mouth once daily   levothyroxine (SYNTHROID) 150 MCG tablet Take 1 tablet (150 mcg total) by mouth daily before breakfast.   lidocaine-prilocaine (EMLA) cream Apply 1 Application topically as needed.   losartan (COZAAR) 100 MG tablet Take 1 tablet by mouth once daily   metoprolol succinate (TOPROL-XL) 50 MG 24 hr tablet TAKE 1 TABLET BY MOUTH ONCE DAILY WITH A MEAL OR  IMMEDIATELY  FOLLOWING   Multiple Vitamins-Minerals (MULTIVITAMIN WITH MINERALS) tablet Take 1 tablet by mouth daily.   nitroGLYCERIN (NITROSTAT) 0.4 MG SL tablet Place 1 tablet (0.4 mg total) under the tongue every 5 (five) minutes as needed. For chest pain (Patient not taking: Reported on 10/26/2021)   prochlorperazine (COMPAZINE) 10 MG tablet Take 1 tablet (10 mg total) by mouth every 6 (six) hours as needed for nausea or vomiting.   Vitamin A 2400 MCG (8000 UT) TABS Take 2,400 mg by mouth daily.   No  facility-administered encounter medications on file as of 12/18/2022.    Allergies (verified) Varenicline, Ace inhibitors, Prednisone, Betalin 12 [vitamin b12], Penicillins, and Sulfa drugs cross reactors   History: Past Medical History:  Diagnosis Date   Anxiety    ON PAXIL, XANAX   AVM (arteriovenous malformation) brain    s/p stent/coil   Blood transfusion    x 2   Cancer (HCC)    Left lung   COPD (chronic obstructive pulmonary disease) (HCC)    no inhaler, no oxygen   Coronary artery disease    Prior inferior MI with stent to RCA, s/p CABG in 2008   Depression    Dizziness    Dyspnea    with exertion, no oxygen   Fatigue    Foot injury 06/01/2017   right - RESOLVED, no longer an issue per patient 05/18/21    GERD (gastroesophageal reflux disease)    Headache(784.0)    UNRUPTURED CEREBRAL ANEURYSM   Hyperlipidemia    Hypertension    Hypothyroidism    Infected cyst of skin 09/18/2013   Left knee pain 06/05/2012   Lung mass    Left lung   Myocardial infarction (HCC)    Normal nuclear stress test Ju;y 2012   No ischemia. EF 70%; fixed defect involving septum, inferoseptal and inferior wall   Pain, dental 02/06/2017   Poor dental hygiene    Retroperitoneal bleeding    Following cardiac cath   Tobacco abuse    Urine discoloration 09/18/2013   Urine incontinence 07/06/2020   UTI (lower urinary tract infection) 10/16/2013   Past Surgical History:  Procedure Laterality Date   CARDIAC CATHETERIZATION  01/02/2007   IT REVEALS MILD INFERIOR WALL HYPOKINESIS. THE EJECTION FRACTION IS AROUND 50%   COLONOSCOPY     CORONARY ARTERY BYPASS GRAFT  11/06/2006   LIMA to LAD, SVG to DX, SVG to LCX & SVG to OM 1 & 2, and SVG to PD   CORONARY STENT PLACEMENT     Remote past stent to RCA   EYE SURGERY Right    cataracts removed   HEMORRHOID SURGERY  11/07/1987   IR IMAGING GUIDED PORT INSERTION  10/04/2022   UPPER GI ENDOSCOPY     VENTRICULOSTOMY  10/06/2011   Procedure: VENTRICULOSTOMY;  Surgeon: Carmela Hurt;  Location: [facility];  Service: Neurosurgery;  Laterality: Right;  Insertion of Ventriculostomy Catheter   VIDEO BRONCHOSCOPY WITH ENDOBRONCHIAL NAVIGATION Left 05/20/2021   Procedure: VIDEO BRONCHOSCOPY WITH ENDOBRONCHIAL NAVIGATION;  Surgeon: Josephine Igo, DO;  Location: [facility];  Service: Pulmonary;  Laterality: Left;   VIDEO BRONCHOSCOPY WITH ENDOBRONCHIAL ULTRASOUND Bilateral 05/20/2021   Procedure: VIDEO BRONCHOSCOPY WITH ENDOBRONCHIAL ULTRASOUND;  Surgeon: Josephine Igo, DO;  Location: [facility];  Service: Pulmonary;  Laterality: Bilateral;   WISDOM TOOTH EXTRACTION     Family History  Problem Relation Age of Onset   Cancer Mother 41   Diabetes Mother    Alcohol abuse Father     Asthma Father    Diabetes Brother    Lung cancer Brother    Ovarian cancer Maternal Aunt    Liver cancer Paternal Aunt    Heart disease Neg Hx    Social History   Socioeconomic History   Marital status: Married    Spouse name: Not on file   Number of children: Not on file   Years of education: Not on file   Highest education level: Not on file  Occupational History   Not on file  Tobacco Use   Smoking status: Every Day    Packs/day: 0.50    Years: 58.00    Total pack years: 29.00    Types: Cigarettes    Start date: 11/06/1957   Smokeless tobacco: Never   Tobacco comments:    Has smoked 3 ppd, current 1.5-2 ppd  Vaping Use   Vaping Use: Never used  Substance and Sexual Activity   Alcohol use: No   Drug use: No   Sexual activity: Yes    Birth control/protection: Post-menopausal  Other Topics Concern   Not on file  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 worked in [employer] most recently.      Lives with husband and 2 adult daughters and 4 grandchildren.   Social Determinants of Health   Financial Resource Strain: Not on file  Food Insecurity: Not on file  Transportation Needs: Not on file  Physical Activity: Not on file  Stress: Not on file  Social Connections: Not on file    Tobacco Counseling Ready to quit: Not Answered Counseling given: Not Answered Tobacco comments: Has smoked 3 ppd, current 1.5-2 ppd   Clinical Intake:                 Diabetic?No         Activities of Daily Living    10/04/2022   12:38 PM  In your present state of health, do you have any difficulty performing the following activities:  Hearing? 0  Vision? 0  Difficulty concentrating or making decisions? 0  Walking or climbing stairs? 0  Dressing or bathing? 0    Patient Care Team: [name], MD as PCP - General (Family Medicine)  Indicate any recent  Medical Services you may have received from other than [facility] providers in the past year (date may be approximate).     Assessment:   This is a routine wellness examination for [place].  Hearing/Vision screen Denies any hearing issues.  Dietary issues and exercise activities discussed:     Goals Addressed   None    Depression Screen    11/24/2022    3:34 PM 10/26/2021    9:54 AM 04/12/2021   11:05 AM 07/06/2020   11:08 AM 10/28/2019    2:47 PM 08/21/2018   11:33 AM 07/17/2018   11:00 AM  PHQ 2/9 Scores  PHQ - 2 Score 4 6 4 3  0 0 0  PHQ- 9 Score 15 14 13 16        Fall Risk    11/24/2022    3:35 PM 10/26/2021    9:54 AM 04/12/2021   11:04 AM 10/28/2019    2:47 PM 08/21/2018   11:33 AM  Fall Risk   Falls in the past year? 0 0 1 0 No  Number falls in past yr: 0 0 0 0   Injury with Fall? 0 0 1 0   Follow up Falls evaluation completed        FALL RISK PREVENTION PERTAINING TO THE HOME:  Any stairs in or around the home? {YES/NO:21197} If so, are there any without handrails? {YES/NO:21197} Home free of loose throw rugs in walkways, pet beds, electrical cords, etc? {YES/NO:21197} Adequate lighting in your home to reduce risk of falls? {YES/NO:21197}  ASSISTIVE DEVICES UTILIZED TO PREVENT FALLS:  Life alert? {YES/NO:21197} Use of a cane, walker or w/c? {YES/NO:21197} Grab bars in the bathroom? {YES/NO:21197} Shower  chair or bench in shower? {YES/NO:21197} Elevated toilet seat or a handicapped toilet? {YES/NO:21197}  TIMED UP AND GO:  Was the test performed? Unable to perform, virtual appointment    Cognitive Function:        Immunizations Immunization History  Administered Date(s) Administered   Influenza,inj,Quad PF,6+ Mos 08/29/2016, 08/21/2018, 10/28/2019, 07/06/2020   PFIZER Comirnaty(Gray Top)Covid-19 Tri-Sucrose Vaccine 04/12/2021   PFIZER(Purple Top)SARS-COV-2 Vaccination 04/15/2020, 05/06/2020   PNEUMOCOCCAL CONJUGATE-20 04/12/2021   Pneumococcal  Conjugate-13 08/29/2016   Pneumococcal Polysaccharide-23 01/24/2018   Tdap 06/05/2012    TDAP status: Due, Education has been provided regarding the importance of this vaccine. Advised may receive this vaccine at local pharmacy or Health Dept. Aware to provide a copy of the vaccination record if obtained from local pharmacy or Health Dept. Verbalized acceptance and understanding.  Flu Vaccine status: Due, Education has been provided regarding the importance of this vaccine. Advised may receive this vaccine at local pharmacy or Health Dept. Aware to provide a copy of the vaccination record if obtained from local pharmacy or Health Dept. Verbalized acceptance and understanding.  Pneumococcal vaccine status: Up to date  Covid-19 vaccine status: Declined, Education has been provided regarding the importance of this vaccine but patient still declined. Advised may receive this vaccine at local pharmacy or Health Dept.or vaccine clinic. Aware to provide a copy of the vaccination record if obtained from local pharmacy or Health Dept. Verbalized acceptance and understanding.  Qualifies for Shingles Vaccine? Yes   Zostavax completed No   Shingrix Completed?: No.    Education has been provided regarding the importance of this vaccine. Patient has been advised to call insurance company to determine out of pocket expense if they have not yet received this vaccine. Advised may also receive vaccine at local pharmacy or Health Dept. Verbalized acceptance and understanding.  Screening Tests Health Maintenance  Topic Date Due   Medicare Annual Wellness (AWV)  Never done   Zoster Vaccines- Shingrix (1 of 2) Never done   DEXA SCAN  Never done   DTaP/Tdap/Td (2 - Td or Tdap) 06/05/2022   COVID-19 Vaccine (4 - 2023-24 season) 07/07/2022   INFLUENZA VACCINE  07/08/2023 (Originally 06/06/2022)   Fecal DNA (Cologuard)  08/03/2023   Pneumonia Vaccine 79+ Years old  Completed   Hepatitis C Screening  Completed   HPV  VACCINES  Aged Out    Health Maintenance  Health Maintenance Due  Topic Date Due   Medicare Annual Wellness (AWV)  Never done   Zoster Vaccines- Shingrix (1 of 2) Never done   DEXA SCAN  Never done   DTaP/Tdap/Td (2 - Td or Tdap) 06/05/2022   COVID-19 Vaccine (4 - 2023-24 season) 07/07/2022    Colorectal cancer screening: Type of screening: Cologuard. Completed 08/02/2020. Repeat every 3 years  {Mammogram status:21018020}  DEXA Scan: Overdue  Lung Cancer Screening: (Low Dose CT Chest recommended if Age 64-80 years, 30 pack-year currently smoking OR have quit w/in 15years.) {DOES NOT does:27190::"does not"} qualify.   Lung Cancer Screening Referral: ***  Additional Screening:  Hepatitis C Screening: does qualify; Completed 08/29/2016  Vision Screening: Recommended annual ophthalmology exams for early detection of glaucoma and other disorders of the eye. Is the patient up to date with their annual eye exam?  {YES/NO:21197} Who is the provider or what is the name of the office in which the patient attends annual eye exams? *** If pt is not established with a provider, would they like to be referred to a provider to  establish care? {YES/NO:21197}.   Dental Screening: Recommended annual dental exams for proper oral hygiene  Community Resource Referral / Chronic Care Management: CRR required this visit?  No   CCM required this visit?  No      Plan:     I have personally reviewed and noted the following in the patient's chart:   Medical and social history Use of alcohol, tobacco or illicit drugs  Current medications and supplements including opioid prescriptions. Patient is not currently taking opioid prescriptions. Functional ability and status Nutritional status Physical activity Advanced directives List of other physicians Hospitalizations, surgeries, and ER visits in previous 12 months Vitals Screenings to include cognitive, depression, and falls Referrals and  appointments  In addition, I have reviewed and discussed with patient certain preventive protocols, quality metrics, and best practice recommendations. A written personalized care plan for preventive services as well as general preventive health recommendations were provided to patient.     Lonna Cobb, CMA   12/18/2022  Nurse Notes: Approximately 30 minute Non-Face -To-Face Medicare Wellness Visit

## 2022-12-18 ENCOUNTER — Telehealth: Payer: Self-pay | Admitting: Internal Medicine

## 2022-12-18 ENCOUNTER — Ambulatory Visit (INDEPENDENT_AMBULATORY_CARE_PROVIDER_SITE_OTHER): Payer: Medicare HMO

## 2022-12-18 DIAGNOSIS — Z0000 Encounter for general adult medical examination without abnormal findings: Secondary | ICD-10-CM

## 2022-12-18 NOTE — Telephone Encounter (Signed)
Called patient regarding February-April appointments, patient is notified.

## 2022-12-21 ENCOUNTER — Other Ambulatory Visit: Payer: Self-pay

## 2022-12-21 ENCOUNTER — Other Ambulatory Visit: Payer: Self-pay | Admitting: Physician Assistant

## 2022-12-21 ENCOUNTER — Other Ambulatory Visit: Payer: Self-pay | Admitting: Internal Medicine

## 2022-12-21 ENCOUNTER — Other Ambulatory Visit: Payer: Self-pay | Admitting: Family Medicine

## 2022-12-21 DIAGNOSIS — I10 Essential (primary) hypertension: Secondary | ICD-10-CM

## 2022-12-21 DIAGNOSIS — Z5111 Encounter for antineoplastic chemotherapy: Secondary | ICD-10-CM

## 2022-12-21 DIAGNOSIS — E039 Hypothyroidism, unspecified: Secondary | ICD-10-CM

## 2022-12-21 DIAGNOSIS — C3490 Malignant neoplasm of unspecified part of unspecified bronchus or lung: Secondary | ICD-10-CM

## 2022-12-21 MED ORDER — FOLIC ACID 1 MG PO TABS: 1.0000 mg | ORAL_TABLET | Freq: Every day | ORAL | 0 refills | Status: DC

## 2022-12-21 MED ORDER — LOSARTAN POTASSIUM 100 MG PO TABS: 100.0000 mg | ORAL_TABLET | Freq: Every day | ORAL | 1 refills | Status: DC

## 2022-12-21 MED ORDER — AMLODIPINE BESYLATE 10 MG PO TABS: ORAL_TABLET | ORAL | 1 refills | Status: DC

## 2022-12-21 MED ORDER — LEVOTHYROXINE SODIUM 150 MCG PO TABS: 150.0000 ug | ORAL_TABLET | Freq: Every day | ORAL | 0 refills | Status: DC

## 2022-12-21 MED ORDER — ESCITALOPRAM OXALATE 10 MG PO TABS: 10.0000 mg | ORAL_TABLET | Freq: Every day | ORAL | 1 refills | Status: DC

## 2022-12-21 MED ORDER — METOPROLOL SUCCINATE ER 50 MG PO TB24: ORAL_TABLET | ORAL | 1 refills | Status: DC

## 2022-12-21 NOTE — Telephone Encounter (Signed)
Patient calls nurse line requesting a pharmacy change.   She reports she does not want to use [facility] anymore and wishes to change to [facility] in [place].  I have updated her pharmacy and pended the medications she is in need of.   I will cancel the Metoprolol prescription at [facility].

## 2023-01-03 ENCOUNTER — Inpatient Hospital Stay: Payer: Medicare HMO

## 2023-01-03 ENCOUNTER — Encounter: Payer: Self-pay | Admitting: Medical Oncology

## 2023-01-03 ENCOUNTER — Other Ambulatory Visit: Payer: Self-pay | Admitting: Internal Medicine

## 2023-01-03 ENCOUNTER — Inpatient Hospital Stay (HOSPITAL_BASED_OUTPATIENT_CLINIC_OR_DEPARTMENT_OTHER): Payer: Medicare HMO | Admitting: Internal Medicine

## 2023-01-03 ENCOUNTER — Encounter: Payer: Self-pay | Admitting: Internal Medicine

## 2023-01-03 ENCOUNTER — Other Ambulatory Visit: Payer: Self-pay

## 2023-01-03 VITALS — BP 139/74 | HR 73 | Resp 18

## 2023-01-03 DIAGNOSIS — Z5112 Encounter for antineoplastic immunotherapy: Secondary | ICD-10-CM | POA: Diagnosis not present

## 2023-01-03 DIAGNOSIS — I252 Old myocardial infarction: Secondary | ICD-10-CM | POA: Diagnosis not present

## 2023-01-03 DIAGNOSIS — Z79899 Other long term (current) drug therapy: Secondary | ICD-10-CM | POA: Diagnosis not present

## 2023-01-03 DIAGNOSIS — C3412 Malignant neoplasm of upper lobe, left bronchus or lung: Secondary | ICD-10-CM

## 2023-01-03 DIAGNOSIS — Z8719 Personal history of other diseases of the digestive system: Secondary | ICD-10-CM | POA: Diagnosis not present

## 2023-01-03 DIAGNOSIS — C7931 Secondary malignant neoplasm of brain: Secondary | ICD-10-CM | POA: Diagnosis not present

## 2023-01-03 DIAGNOSIS — Z87440 Personal history of urinary (tract) infections: Secondary | ICD-10-CM | POA: Diagnosis not present

## 2023-01-03 DIAGNOSIS — R5383 Other fatigue: Secondary | ICD-10-CM | POA: Diagnosis not present

## 2023-01-03 DIAGNOSIS — C3490 Malignant neoplasm of unspecified part of unspecified bronchus or lung: Secondary | ICD-10-CM

## 2023-01-03 DIAGNOSIS — R0609 Other forms of dyspnea: Secondary | ICD-10-CM | POA: Diagnosis not present

## 2023-01-03 DIAGNOSIS — E785 Hyperlipidemia, unspecified: Secondary | ICD-10-CM | POA: Diagnosis not present

## 2023-01-03 DIAGNOSIS — Z5111 Encounter for antineoplastic chemotherapy: Secondary | ICD-10-CM | POA: Diagnosis not present

## 2023-01-03 DIAGNOSIS — Z95828 Presence of other vascular implants and grafts: Secondary | ICD-10-CM

## 2023-01-03 DIAGNOSIS — Z923 Personal history of irradiation: Secondary | ICD-10-CM | POA: Diagnosis not present

## 2023-01-03 DIAGNOSIS — Z882 Allergy status to sulfonamides status: Secondary | ICD-10-CM | POA: Diagnosis not present

## 2023-01-03 DIAGNOSIS — Z79620 Long term (current) use of immunosuppressive biologic: Secondary | ICD-10-CM | POA: Diagnosis not present

## 2023-01-03 DIAGNOSIS — Z79631 Long term (current) use of antimetabolite agent: Secondary | ICD-10-CM | POA: Diagnosis not present

## 2023-01-03 DIAGNOSIS — E039 Hypothyroidism, unspecified: Secondary | ICD-10-CM | POA: Diagnosis not present

## 2023-01-03 DIAGNOSIS — Z888 Allergy status to other drugs, medicaments and biological substances status: Secondary | ICD-10-CM | POA: Diagnosis not present

## 2023-01-03 DIAGNOSIS — Z880 Allergy status to penicillin: Secondary | ICD-10-CM | POA: Diagnosis not present

## 2023-01-03 LAB — CBC WITH DIFFERENTIAL (CANCER CENTER ONLY)
Abs Immature Granulocytes: 0.02 10*3/uL (ref 0.00–0.07)
Basophils Absolute: 0.1 10*3/uL (ref 0.0–0.1)
Basophils Relative: 1 %
Eosinophils Absolute: 0.2 10*3/uL (ref 0.0–0.5)
Eosinophils Relative: 2 %
HCT: 34.2 % — ABNORMAL LOW (ref 36.0–46.0)
Hemoglobin: 11.5 g/dL — ABNORMAL LOW (ref 12.0–15.0)
Immature Granulocytes: 0 %
Lymphocytes Relative: 22 %
Lymphs Abs: 1.5 10*3/uL (ref 0.7–4.0)
MCH: 35.3 pg — ABNORMAL HIGH (ref 26.0–34.0)
MCHC: 33.6 g/dL (ref 30.0–36.0)
MCV: 104.9 fL — ABNORMAL HIGH (ref 80.0–100.0)
Monocytes Absolute: 0.8 10*3/uL (ref 0.1–1.0)
Monocytes Relative: 11 %
Neutro Abs: 4.6 10*3/uL (ref 1.7–7.7)
Neutrophils Relative %: 64 %
Platelet Count: 382 10*3/uL (ref 150–400)
RBC: 3.26 MIL/uL — ABNORMAL LOW (ref 3.87–5.11)
RDW: 15.3 % (ref 11.5–15.5)
WBC Count: 7.1 10*3/uL (ref 4.0–10.5)
nRBC: 0 % (ref 0.0–0.2)

## 2023-01-03 LAB — CMP (CANCER CENTER ONLY)
ALT: 14 U/L (ref 0–44)
AST: 28 U/L (ref 15–41)
Albumin: 3.8 g/dL (ref 3.5–5.0)
Alkaline Phosphatase: 75 U/L (ref 38–126)
Anion gap: 6 (ref 5–15)
BUN: 8 mg/dL (ref 8–23)
CO2: 32 mmol/L (ref 22–32)
Calcium: 9 mg/dL (ref 8.9–10.3)
Chloride: 102 mmol/L (ref 98–111)
Creatinine: 0.99 mg/dL (ref 0.44–1.00)
GFR, Estimated: >60 mL/min (ref >60)
Glucose, Bld: 142 mg/dL — ABNORMAL HIGH (ref 70–99)
Potassium: 3.1 mmol/L — ABNORMAL LOW (ref 3.5–5.1)
Sodium: 140 mmol/L (ref 135–145)
Total Bilirubin: 0.3 mg/dL (ref 0.3–1.2)
Total Protein: 6.7 g/dL (ref 6.5–8.1)

## 2023-01-03 MED ORDER — SODIUM CHLORIDE 0.9 % IV SOLN: Freq: Once | INTRAVENOUS | Status: AC

## 2023-01-03 MED ORDER — SODIUM CHLORIDE 0.9% FLUSH
10.0000 mL | INTRAVENOUS | Status: DC | PRN
  Administered 2023-01-03: 10 mL

## 2023-01-03 MED ORDER — PROCHLORPERAZINE MALEATE 10 MG PO TABS
10.0000 mg | ORAL_TABLET | Freq: Once | ORAL | Status: AC
  Administered 2023-01-03: 10 mg via ORAL
  Filled 2023-01-03: qty 1

## 2023-01-03 MED ORDER — SODIUM CHLORIDE 0.9 % IV SOLN
500.0000 mg/m2 | Freq: Once | INTRAVENOUS | Status: AC
  Administered 2023-01-03: 900 mg via INTRAVENOUS
  Filled 2023-01-03: qty 20

## 2023-01-03 MED ORDER — SODIUM CHLORIDE 0.9 % IV SOLN
200.0000 mg | Freq: Once | INTRAVENOUS | Status: AC
  Administered 2023-01-03: 200 mg via INTRAVENOUS
  Filled 2023-01-03: qty 8

## 2023-01-03 MED ORDER — SODIUM CHLORIDE 0.9% FLUSH
10.0000 mL | Freq: Once | INTRAVENOUS | Status: AC
  Administered 2023-01-03: 10 mL

## 2023-01-03 MED ORDER — POTASSIUM CHLORIDE CRYS ER 20 MEQ PO TBCR: 20.0000 meq | EXTENDED_RELEASE_TABLET | Freq: Two times a day (BID) | ORAL | 0 refills | Status: DC

## 2023-01-03 MED ORDER — HEPARIN SOD (PORK) LOCK FLUSH 100 UNIT/ML IV SOLN
500.0000 [IU] | Freq: Once | INTRAVENOUS | Status: AC | PRN
  Administered 2023-01-03: 500 [IU]

## 2023-01-03 NOTE — Patient Instructions (Signed)
Steps to Quit Smoking Smoking tobacco is the leading cause of preventable death. It can affect almost every organ in the body. Smoking puts you and people around you at risk for many serious, long-lasting (chronic) diseases. Quitting smoking can be hard, but it is one of the best things that you can do for your health. It is never too late to quit. Do not give up if you cannot quit the first time. Some people need to try many times to quit. Do your best to stick to your quit plan, and talk with your doctor if you have any questions or concerns. How do I get ready to quit? Pick a date to quit. Set a date within the next 2 weeks to give you time to prepare. Write down the reasons why you are quitting. Keep this list in places where you will see it often. Tell your family, friends, and co-workers that you are quitting. Their support is important. Talk with your doctor about the choices that may help you quit. Find out if your health insurance will pay for these treatments. Know the people, places, things, and activities that make you want to smoke (triggers). Avoid them. What first steps can I take to quit smoking? Throw away all cigarettes at home, at work, and in your car. Throw away the things that you use when you smoke, such as ashtrays and lighters. Clean your car. Empty the ashtray. Clean your home, including curtains and carpets. What can I do to help me quit smoking? Talk with your doctor about taking medicines and seeing a counselor. You are more likely to succeed when you do both. If you are pregnant or breastfeeding: Talk with your doctor about counseling or other ways to quit smoking. Do not take medicine to help you quit smoking unless your doctor tells you to. Quit right away Quit smoking completely, instead of slowly cutting back on how much you smoke over a period of time. Stopping smoking right away may be more successful than slowly quitting. Go to counseling. In-person is best  if this is an option. You are more likely to quit if you go to counseling sessions regularly. Take medicine You may take medicines to help you quit. Some medicines need a prescription, and some you can buy over-the-counter. Some medicines may contain a drug called nicotine to replace the nicotine in cigarettes. Medicines may: Help you stop having the desire to smoke (cravings). Help to stop the problems that come when you stop smoking (withdrawal symptoms). Your doctor may ask you to use: Nicotine patches, gum, or lozenges. Nicotine inhalers or sprays. Non-nicotine medicine that you take by mouth. Find resources Find resources and other ways to help you quit smoking and remain smoke-free after you quit. They include: Online chats with a counselor. Phone quitlines. Printed self-help materials. Support groups or group counseling. Text messaging programs. Mobile phone apps. Use apps on your mobile phone or tablet that can help you stick to your quit plan. Examples of free services include Quit Guide from the CDC and smokefree.gov  What can I do to make it easier to quit?  Talk to your family and friends. Ask them to support and encourage you. Call a phone quitline, such as 1-800-QUIT-NOW, reach out to support groups, or work with a counselor. Ask people who smoke to not smoke around you. Avoid places that make you want to smoke, such as: Bars. Parties. Smoke-break areas at work. Spend time with people who do not smoke. Lower   the stress in your life. Stress can make you want to smoke. Try these things to lower stress: Getting regular exercise. Doing deep-breathing exercises. Doing yoga. Meditating. What benefits will I see if I quit smoking? Over time, you may have: A better sense of smell and taste. Less coughing and sore throat. A slower heart rate. Lower blood pressure. Clearer skin. Better breathing. Fewer sick days. Summary Quitting smoking can be hard, but it is one of  the best things that you can do for your health. Do not give up if you cannot quit the first time. Some people need to try many times to quit. When you decide to quit smoking, make a plan to help you succeed. Quit smoking right away, not slowly over a period of time. When you start quitting, get help and support to keep you smoke-free. This information is not intended to replace advice given to you by your health care provider. Make sure you discuss any questions you have with your health care provider. Document Revised: 10/14/2021 Document Reviewed: 10/14/2021 Elsevier Patient Education  2023 [employer]  

## 2023-01-03 NOTE — Patient Instructions (Signed)
[facility]  Discharge Instructions: Thank you for choosing [facility] to provide your oncology and hematology care.   If you have a lab appointment with the Cancer Center, please go directly to the Cancer Center and check in at the registration area.   Wear comfortable clothing and clothing appropriate for easy access to any Portacath or PICC line.   We strive to give you quality time with your provider. You may need to reschedule your appointment if you arrive late (15 or more minutes).  Arriving late affects you and other patients whose appointments are after yours.  Also, if you miss three or more appointments without notifying the office, you may be dismissed from the clinic at the provider's discretion.      For prescription refill requests, have your pharmacy contact our office and allow 72 hours for refills to be completed.    Today you received the following chemotherapy and/or immunotherapy agents: Keytruda, Alimta.       To help prevent nausea and vomiting after your treatment, we encourage you to take your nausea medication as directed.  BELOW ARE SYMPTOMS THAT SHOULD BE REPORTED IMMEDIATELY: *FEVER GREATER THAN 100.4 F (38 C) OR HIGHER *CHILLS OR SWEATING *NAUSEA AND VOMITING THAT IS NOT CONTROLLED WITH YOUR NAUSEA MEDICATION *UNUSUAL SHORTNESS OF BREATH *UNUSUAL BRUISING OR BLEEDING *URINARY PROBLEMS (pain or burning when urinating, or frequent urination) *BOWEL PROBLEMS (unusual diarrhea, constipation, pain near the anus) TENDERNESS IN MOUTH AND THROAT WITH OR WITHOUT PRESENCE OF ULCERS (sore throat, sores in mouth, or a toothache) UNUSUAL RASH, SWELLING OR PAIN  UNUSUAL VAGINAL DISCHARGE OR ITCHING   Items with * indicate a potential emergency and should be followed up as soon as possible or go to the Emergency Department if any problems should occur.  Please show the CHEMOTHERAPY ALERT CARD or IMMUNOTHERAPY ALERT CARD  at check-in to the Emergency Department and triage nurse.  Should you have questions after your visit or need to cancel or reschedule your appointment, please contact  [facility]  Dept: [phone]  and follow the prompts.  Office hours are 8:00 a.m. to 4:30 p.m. Monday - Friday. Please note that voicemails left after 4:00 p.m. may not be returned until the following business day.  We are closed weekends and major holidays. You have access to a nurse at all times for urgent questions. Please call the main number to the clinic Dept: [phone] and follow the prompts.   For any non-urgent questions, you may also contact your provider using MyChart. We now offer e-Visits for anyone 18 and older to request care online for non-urgent symptoms. For details visit [email or web address].   Also download the MyChart app! Go to the app store, search "MyChart", open the app, select , and log in with your MyChart username and password.   

## 2023-01-03 NOTE — Progress Notes (Signed)
[place] [phone]   [phone]  OFFICE PROGRESS NOTE  [name], MD [place] [address]  DIAGNOSIS: Stage IV (T2 a, N2, M1b) non-small cell lung cancer, adenocarcinoma presented with left upper lobe lung mass in addition to left hilar and precarinal metastatic adenopathy and small left lower lobe pulmonary nodule in addition to solitary left frontal brain metastasis diagnosed in July 2022.  Molecular studies by [employer] showed  Positive KRAS G12C mutation PD-L1 expression was less than 1%   PRIOR THERAPY:  1) Status post SRS to the solitary brain metastasis. 20 Treatment for the locally advanced disease in the chest with carboplatin for AUC of 2 and paclitaxel 45 Mg/M2.  Status post 6 cycles.  Last dose was given 07/25/2021.  CURRENT THERAPY: Systemic chemotherapy with carboplatin for AUC of 5, Alimta 500 Mg/M2 and Keytruda 200 Mg IV every 3 weeks.  First dose August 31, 2021.  Status post 22 cycles.  Starting from cycle #5 the patient will be on maintenance treatment with Alimta and Keytruda every 3 weeks.  INTERVAL HISTORY: Jill [name] 72 y.o. female returns to the clinic today for follow-up visit.  The patient is feeling fine today with no concerning complaints except for mild fatigue.  She also has shortness of breath with exertion.  She denied having any chest pain, cough or hemoptysis.  She has no nausea, vomiting, diarrhea or constipation.  She denied having any headache or visual changes.  She has no recent weight loss or night sweats.  She is here today for evaluation before starting cycle #23 of her treatment.  MEDICAL HISTORY: Past Medical History:  Diagnosis Date   Anxiety    ON PAXIL, XANAX   AVM (arteriovenous malformation) brain    s/p stent/coil   Blood transfusion    x 2   Cancer (HCC)    Left lung   COPD (chronic obstructive pulmonary disease) (HCC)    no inhaler, no oxygen    Coronary artery disease    Prior inferior MI with stent to RCA, s/p CABG in 2008   Depression    Dizziness    Dyspnea    with exertion, no oxygen   Fatigue    Foot injury 06/01/2017   right - RESOLVED, no longer an issue per patient 05/18/21   GERD (gastroesophageal reflux disease)    Headache(784.0)    UNRUPTURED CEREBRAL ANEURYSM   Hyperlipidemia    Hypertension    Hypothyroidism    Infected cyst of skin 09/18/2013   Left knee pain 06/05/2012   Lung mass    Left lung   Myocardial infarction ([place])    Normal nuclear stress test Ju;y 2012   No ischemia. EF 70%; fixed defect involving septum, inferoseptal and inferior wall   Pain, dental 02/06/2017   Poor dental hygiene    Retroperitoneal bleeding    Following cardiac cath   Tobacco abuse    Urine discoloration 09/18/2013   Urine incontinence 07/06/2020   UTI (lower urinary tract infection) 10/16/2013    ALLERGIES:  is allergic to varenicline, ace inhibitors, prednisone, betalin 12 [vitamin b12], penicillins, and sulfa drugs cross reactors.  MEDICATIONS:  Current Outpatient Medications  Medication Sig Dispense Refill   acetaminophen (TYLENOL) 500 MG tablet Take 500 mg by mouth every 6 (six) hours as needed.     amLODipine (NORVASC) 10 MG tablet TAKE 1 TABLET BY MOUTH ONCE DAILY . APPOINTMENT REQUIRED FOR  FUTURE REFILLS 90 tablet 1   aspirin EC 81 MG tablet Take 1 tablet (81 mg total) by mouth daily. 90 tablet 2   atorvastatin (LIPITOR) 40 MG tablet Take 1 tablet by mouth once daily 90 tablet 1   Cholecalciferol (VITAMIN D3) 250 MCG (10000 UT) capsule Take 10,000 mcg by mouth daily.     diphenhydrAMINE HCl (BENADRYL ALLERGY PO) Take 25 mg by mouth at bedtime as needed (for sleep).     escitalopram (LEXAPRO) 10 MG tablet Take 1 tablet (10 mg total) by mouth daily. 90 tablet 1   folic acid (FOLVITE) 1 MG tablet Take 1 tablet (1 mg total) by mouth daily. 90 tablet 0   levothyroxine (SYNTHROID) 150 MCG tablet Take 1 tablet (150  mcg total) by mouth daily before breakfast. 90 tablet 0   lidocaine-prilocaine (EMLA) cream Apply 1 Application topically as needed. 30 g 2   losartan (COZAAR) 100 MG tablet Take 1 tablet (100 mg total) by mouth daily. 90 tablet 1   metoprolol succinate (TOPROL-XL) 50 MG 24 hr tablet TAKE 1 TABLET BY MOUTH ONCE DAILY WITH MEALS OR  IMMEDIATLEY  AFTER  A  MEAL 90 tablet 1   Multiple Vitamins-Minerals (MULTIVITAMIN WITH MINERALS) tablet Take 1 tablet by mouth daily.     nitroGLYCERIN (NITROSTAT) 0.4 MG SL tablet Place 1 tablet (0.4 mg total) under the tongue every 5 (five) minutes as needed. For chest pain (Patient not taking: Reported on 10/26/2021) 25 tablet 6   prochlorperazine (COMPAZINE) 10 MG tablet Take 1 tablet (10 mg total) by mouth every 6 (six) hours as needed for nausea or vomiting. 30 tablet 0   Vitamin A 2400 MCG (8000 UT) TABS Take 2,400 mg by mouth daily.     No current facility-administered medications for this visit.    SURGICAL HISTORY:  Past Surgical History:  Procedure Laterality Date   CARDIAC CATHETERIZATION  01/02/2007   IT REVEALS MILD INFERIOR WALL HYPOKINESIS. THE EJECTION FRACTION IS AROUND 50%   COLONOSCOPY     CORONARY ARTERY BYPASS GRAFT  11/06/2006   LIMA to LAD, SVG to DX, SVG to LCX & SVG to OM 1 & 2, and SVG to PD   CORONARY STENT PLACEMENT     Remote past stent to RCA   EYE SURGERY Right    cataracts removed   HEMORRHOID SURGERY  11/07/1987   IR IMAGING GUIDED PORT INSERTION  10/04/2022   UPPER GI ENDOSCOPY     VENTRICULOSTOMY  10/06/2011   Procedure: VENTRICULOSTOMY;  Surgeon: Winfield Cunas;  Location: [facility];  Service: Neurosurgery;  Laterality: Right;  Insertion of Ventriculostomy Catheter   VIDEO BRONCHOSCOPY WITH ENDOBRONCHIAL NAVIGATION Left 05/20/2021   Procedure: VIDEO BRONCHOSCOPY WITH ENDOBRONCHIAL NAVIGATION;  Surgeon: Garner Nash, DO;  Location: [facility];  Service: Pulmonary;  Laterality: Left;   VIDEO BRONCHOSCOPY WITH  ENDOBRONCHIAL ULTRASOUND Bilateral 05/20/2021   Procedure: VIDEO BRONCHOSCOPY WITH ENDOBRONCHIAL ULTRASOUND;  Surgeon: Garner Nash, DO;  Location: [facility];  Service: Pulmonary;  Laterality: Bilateral;   WISDOM TOOTH EXTRACTION      REVIEW OF SYSTEMS:  A comprehensive review of systems was negative except for: Constitutional: positive for fatigue Respiratory: positive for dyspnea on exertion   PHYSICAL EXAMINATION: General appearance: alert, cooperative, fatigued, and no distress Head: Normocephalic, without obvious abnormality, atraumatic Neck: no adenopathy, no JVD, supple, symmetrical, trachea midline, and thyroid not enlarged, symmetric, no tenderness/mass/nodules Lymph nodes: Cervical, supraclavicular, and axillary nodes normal. Resp: clear to auscultation bilaterally  Back: symmetric, no curvature. ROM normal. No CVA tenderness. Cardio: regular rate and rhythm, S1, S2 normal, no murmur, click, rub or gallop GI: soft, non-tender; bowel sounds normal; no masses,  no organomegaly Extremities: extremities normal, atraumatic, no cyanosis or edema  ECOG PERFORMANCE STATUS: 1 - Symptomatic but completely ambulatory  Blood pressure 137/66, pulse 88, temperature 97.8 F (36.6 C), temperature source Oral, resp. rate 18, weight 143 lb 8 oz (65.1 kg), SpO2 92 %.  LABORATORY DATA: Lab Results  Component Value Date   WBC 4.8 12/13/2022   HGB 11.3 (L) 12/13/2022   HCT 33.9 (L) 12/13/2022   MCV 103.4 (H) 12/13/2022   PLT 406 (H) 12/13/2022      Chemistry      Component Value Date/Time   NA 139 12/13/2022 0948   NA 141 04/12/2021 1356   K 3.3 (L) 12/13/2022 0948   CL 103 12/13/2022 0948   CO2 33 (H) 12/13/2022 0948   BUN 10 12/13/2022 0948   BUN 13 04/12/2021 1356   CREATININE 0.94 12/13/2022 0948   CREATININE 0.99 08/29/2016 1130      Component Value Date/Time   CALCIUM 9.2 12/13/2022 0948   ALKPHOS 75 12/13/2022 0948   AST 26 12/13/2022 0948   ALT 16 12/13/2022 0948    BILITOT 0.4 12/13/2022 0948       RADIOGRAPHIC STUDIES: No results found.  ASSESSMENT AND PLAN: This is a very pleasant 72 years old white female diagnosed with a stage IV (T2 a, N2, M1 B) non-small cell lung cancer, adenocarcinoma presented with left upper lobe lung mass in addition to left hilar and precarinal metastatic adenopathy as well as small left lower lobe pulmonary nodule and solitary left frontal brain metastasis diagnosed in July 2022.  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 The patient is status post SRS to the solitary brain metastasis. She underwent a course of concurrent chemoradiation to the locally advanced disease in the chest with carboplatin for AUC of 2 and paclitaxel 45 Mg/M2 status post 7 cycles.  The patient has been tolerating her treatment well with no concerning adverse effects. Her scan showed mild improvement of her disease with no concerning findings for progression. Technically the patient has a stage IV lung cancer with brain metastasis  The patient is currently undergoing systemic chemotherapy with carboplatin for AUC of 5, Alimta 500 Mg/M2 and Keytruda 200 Mg IV every 3 weeks status post 22 cycles.  Starting from cycle #5 the patient will be on maintenance treatment with Alimta and Keytruda every 3 weeks.   The patient has been tolerating this treatment well except for the mild fatigue. I recommended for her to proceed with cycle #23 today as planned. I will see her back for follow-up visit in 3 weeks for evaluation before the next cycle of her treatment. She was advised to call immediately if she has any concerning symptoms in the interval. The patient voices understanding of current disease status and treatment options and is in agreement with the current care plan. The total time spent in the appointment was 20 minutes.  All questions were answered. The patient knows to call the clinic with any problems,  questions or concerns. We can certainly see the patient much sooner if necessary.  Disclaimer: This note was dictated with voice recognition software. Similar sounding words can inadvertently be transcribed and may not be corrected upon review.

## 2023-01-03 NOTE — Progress Notes (Unsigned)
Patient seen by MD today  Vitals are within treatment parameters.  Labs reviewed: and are within treatment parameters.  Per physician team, patient is ready for treatment and there are NO modifications to the treatment plan.

## 2023-01-03 NOTE — Progress Notes (Signed)
Patient potassium low 3.1 today. Potassium prescription sent to her pharmacy by Dr. Julien Nordmann. Patient aware.

## 2023-01-07 ENCOUNTER — Other Ambulatory Visit: Payer: Self-pay | Admitting: Internal Medicine

## 2023-01-10 ENCOUNTER — Other Ambulatory Visit: Payer: Self-pay

## 2023-01-11 ENCOUNTER — Ambulatory Visit
Admission: RE | Admit: 2023-01-11 | Discharge: 2023-01-11 | Disposition: A | Discharge status: Home / Self Care | Payer: Medicare HMO | Source: Ambulatory Visit | Attending: Radiation Oncology | Admitting: Radiation Oncology

## 2023-01-11 DIAGNOSIS — G939 Disorder of brain, unspecified: Secondary | ICD-10-CM | POA: Diagnosis not present

## 2023-01-11 DIAGNOSIS — C7931 Secondary malignant neoplasm of brain: Secondary | ICD-10-CM

## 2023-01-11 DIAGNOSIS — C729 Malignant neoplasm of central nervous system, unspecified: Secondary | ICD-10-CM | POA: Diagnosis not present

## 2023-01-11 MED ORDER — GADOPICLENOL 0.5 MMOL/ML IV SOLN
10.0000 mL | Freq: Once | INTRAVENOUS | Status: AC | PRN
  Administered 2023-01-11: 10 mL via INTRAVENOUS

## 2023-01-15 ENCOUNTER — Inpatient Hospital Stay: Payer: Medicare HMO | Attending: Internal Medicine

## 2023-01-15 DIAGNOSIS — C779 Secondary and unspecified malignant neoplasm of lymph node, unspecified: Secondary | ICD-10-CM | POA: Insufficient documentation

## 2023-01-15 DIAGNOSIS — Z9221 Personal history of antineoplastic chemotherapy: Secondary | ICD-10-CM | POA: Insufficient documentation

## 2023-01-15 DIAGNOSIS — R0981 Nasal congestion: Secondary | ICD-10-CM | POA: Insufficient documentation

## 2023-01-15 DIAGNOSIS — Z882 Allergy status to sulfonamides status: Secondary | ICD-10-CM | POA: Insufficient documentation

## 2023-01-15 DIAGNOSIS — Z5112 Encounter for antineoplastic immunotherapy: Secondary | ICD-10-CM | POA: Insufficient documentation

## 2023-01-15 DIAGNOSIS — Z923 Personal history of irradiation: Secondary | ICD-10-CM | POA: Insufficient documentation

## 2023-01-15 DIAGNOSIS — F17210 Nicotine dependence, cigarettes, uncomplicated: Secondary | ICD-10-CM | POA: Insufficient documentation

## 2023-01-15 DIAGNOSIS — I252 Old myocardial infarction: Secondary | ICD-10-CM | POA: Insufficient documentation

## 2023-01-15 DIAGNOSIS — C7931 Secondary malignant neoplasm of brain: Secondary | ICD-10-CM | POA: Insufficient documentation

## 2023-01-15 DIAGNOSIS — Z880 Allergy status to penicillin: Secondary | ICD-10-CM | POA: Insufficient documentation

## 2023-01-15 DIAGNOSIS — Z8719 Personal history of other diseases of the digestive system: Secondary | ICD-10-CM | POA: Insufficient documentation

## 2023-01-15 DIAGNOSIS — R0602 Shortness of breath: Secondary | ICD-10-CM | POA: Insufficient documentation

## 2023-01-15 DIAGNOSIS — C3412 Malignant neoplasm of upper lobe, left bronchus or lung: Secondary | ICD-10-CM | POA: Insufficient documentation

## 2023-01-15 DIAGNOSIS — Z5111 Encounter for antineoplastic chemotherapy: Secondary | ICD-10-CM | POA: Insufficient documentation

## 2023-01-15 DIAGNOSIS — J449 Chronic obstructive pulmonary disease, unspecified: Secondary | ICD-10-CM | POA: Insufficient documentation

## 2023-01-15 DIAGNOSIS — Z888 Allergy status to other drugs, medicaments and biological substances status: Secondary | ICD-10-CM | POA: Insufficient documentation

## 2023-01-15 DIAGNOSIS — Z87440 Personal history of urinary (tract) infections: Secondary | ICD-10-CM | POA: Insufficient documentation

## 2023-01-15 DIAGNOSIS — Z79899 Other long term (current) drug therapy: Secondary | ICD-10-CM | POA: Insufficient documentation

## 2023-01-15 DIAGNOSIS — E039 Hypothyroidism, unspecified: Secondary | ICD-10-CM | POA: Insufficient documentation

## 2023-01-15 DIAGNOSIS — E785 Hyperlipidemia, unspecified: Secondary | ICD-10-CM | POA: Insufficient documentation

## 2023-01-16 ENCOUNTER — Encounter: Payer: Self-pay | Admitting: Urology

## 2023-01-16 NOTE — Progress Notes (Signed)
Telephone nursing appointment for to review most recent scan from . I verified patient's identity and began nursing interview. Patient reports SOB (COPD), cough, rhinorrhagia, fatigue, and reduced appetite.   Meaningful use complete.   Patient aware of their 10:30am-01/17/23 telephone appointment w/ Ashlyn Bruning PA-C. I left my extension [phone] in case patient needs anything. Patient verbalized understanding. This concludes the nursing interview.   Patient contact [phone]     Leandra Kern, LPN

## 2023-01-17 ENCOUNTER — Ambulatory Visit
Admission: RE | Admit: 2023-01-17 | Discharge: 2023-01-17 | Disposition: A | Discharge status: Home / Self Care | Payer: Medicare HMO | Source: Ambulatory Visit | Attending: Urology | Admitting: Urology

## 2023-01-17 DIAGNOSIS — C3412 Malignant neoplasm of upper lobe, left bronchus or lung: Secondary | ICD-10-CM | POA: Diagnosis not present

## 2023-01-17 DIAGNOSIS — C7931 Secondary malignant neoplasm of brain: Secondary | ICD-10-CM | POA: Diagnosis not present

## 2023-01-17 DIAGNOSIS — C3490 Malignant neoplasm of unspecified part of unspecified bronchus or lung: Secondary | ICD-10-CM

## 2023-01-17 NOTE — Progress Notes (Signed)
Radiation Oncology         [phone] ________________________________  Name: Jill [name] MRN: [id number]  Date: 01/17/2023  DOB: [birth year]  Post Treatment Note  CC: [name], MD  Curt Bears, MD  Diagnosis:   72 yo woman with stage IV NSCLC, adenocarcinoma of the left upper lung - Stage IVA - with brain metastases     Interval Since Last Radiation: 1 year and 4 months 09/28/21: SRS//PTV2-3: The metastatic lesions  in the left occipital (3 mm) and left temporal (1 mm) were treated to 20 Gy in a single fraction  06/23/21: SRS// PTV1: The solitary 7 mm left frontal  brain metastasis was treated to 20 Gy in a single fraction   06/15/21 - 08/01/21: The primary tumor in the left lung and nodes were treated to 66 Gy in 33 fractions of 2 Gy (concurrent with chemotherapy).  Narrative:  I contacted the patient to conduct her routine scheduled 3 month follow up visit to review results of her recent MRI brain scan via telephone to spare the patient unnecessary potential exposure in the healthcare setting during the current COVID-19 pandemic.  The patient was notified in advance and gave permission to proceed with this visit format.    She tolerated her SRS treatments well and she remains without complaints.  Her recent MRI brain scan from 01/11/2023 shows a stable appearance of the residual 5 mm focus of enhancement in the left frontal lobe (previously treated lesion) and no visible sequela of previously treated lesions in the left temporal lobe and left occipital lobe.  There are no new lesions.  We reviewed these results today.                        On review of systems, the patient states that she is doing well in general and is currently without complaints aside from some mild fatigue.  She specifically denies chest pain, increased shortness of breath or hemoptysis.  She reports a healthy appetite and is maintaining her weight.  She denies dysphagia, abdominal pain, nausea, vomiting  or night sweats.  She has occasional, mild headaches but nothing that has been persistent or concerning.  She denies any decrease in her visual or auditory acuity, dizziness/imbalance, focal weakness in the upper or lower extremities, tremor or seizure activity.  She initially completed 6 cycles of systemic therapy with carboplatin/paclitaxel systemic chemotherapy prior to switching to carboplatin, Alimta and Keytruda which she completed 5 cycles of. She is now on maintenance therapy with Alimta/Keytruda since 12/29/2021 and continues to tolerate this well.  Her restaging CT C/A/P from 10/27/22 was stable in appearance and without evidence of disease recurrence or progression. Overall, she is pleased with her progress to date and is scheduled for a follow up visit with Dr. Julien Nordmann 02/14/23 and will likely have repeat systemic imaging prior to that visit.  Her 12th grandchild, 59th grand-daughter, was born 11/02/2021 and brings her great joy. She enjoys spending time with her grand-kids daily which helps keep her active.  ALLERGIES:  is allergic to varenicline, ace inhibitors, prednisone, betalin 12 [vitamin b12], penicillins, and sulfa drugs cross reactors.  Meds: Current Outpatient Medications  Medication Sig Dispense Refill   acetaminophen (TYLENOL) 500 MG tablet Take 500 mg by mouth every 6 (six) hours as needed.     amLODipine (NORVASC) 10 MG tablet TAKE 1 TABLET BY MOUTH ONCE DAILY . APPOINTMENT REQUIRED FOR FUTURE REFILLS 90 tablet 1  aspirin EC 81 MG tablet Take 1 tablet (81 mg total) by mouth daily. 90 tablet 2   atorvastatin (LIPITOR) 40 MG tablet Take 1 tablet by mouth once daily 90 tablet 1   Cholecalciferol (VITAMIN D3) 250 MCG (10000 UT) capsule Take 10,000 mcg by mouth daily.     diphenhydrAMINE HCl (BENADRYL ALLERGY PO) Take 25 mg by mouth at bedtime as needed (for sleep).     escitalopram (LEXAPRO) 10 MG tablet Take 1 tablet (10 mg total) by mouth daily. 90 tablet 1   folic acid  (FOLVITE) 1 MG tablet Take 1 tablet (1 mg total) by mouth daily. 90 tablet 0   levothyroxine (SYNTHROID) 150 MCG tablet Take 1 tablet (150 mcg total) by mouth daily before breakfast. 90 tablet 0   lidocaine-prilocaine (EMLA) cream Apply 1 Application topically as needed. 30 g 2   losartan (COZAAR) 100 MG tablet Take 1 tablet (100 mg total) by mouth daily. 90 tablet 1   metoprolol succinate (TOPROL-XL) 50 MG 24 hr tablet TAKE 1 TABLET BY MOUTH ONCE DAILY WITH MEALS OR  IMMEDIATLEY  AFTER  A  MEAL 90 tablet 1   Multiple Vitamins-Minerals (MULTIVITAMIN WITH MINERALS) tablet Take 1 tablet by mouth daily.     nitroGLYCERIN (NITROSTAT) 0.4 MG SL tablet Place 1 tablet (0.4 mg total) under the tongue every 5 (five) minutes as needed. For chest pain (Patient not taking: Reported on 10/26/2021) 25 tablet 6   potassium chloride SA (KLOR-CON M) 20 MEQ tablet Take 1 tablet (20 mEq total) by mouth 2 (two) times daily. 7 tablet 0   prochlorperazine (COMPAZINE) 10 MG tablet TAKE 1 TABLET BY MOUTH EVERY 6 HOURS AS NEEDED FOR NAUSEA FOR VOMITING 30 tablet 0   Vitamin A 2400 MCG (8000 UT) TABS Take 2,400 mg by mouth daily.     No current facility-administered medications for this encounter.    Physical Findings:  vitals were not taken for this visit.  Pain Assessment Pain Score: 0-No pain/10 Unable to assess due to telephone follow-up visit format.  Lab Findings: Lab Results  Component Value Date   WBC 7.1 01/03/2023   HGB 11.5 (L) 01/03/2023   HCT 34.2 (L) 01/03/2023   MCV 104.9 (H) 01/03/2023   PLT 382 01/03/2023     Radiographic Findings: MR Brain W Wo Contrast  Result Date: 01/12/2023 CLINICAL DATA:  Brain metastases, assess treatment response 3T SRS Protocol EXAM: MRI HEAD WITHOUT AND WITH CONTRAST TECHNIQUE: Multiplanar, multiecho pulse sequences of the brain and surrounding structures were obtained without and with intravenous contrast. CONTRAST:  8 ml [facility] COMPARISON:  MRI head 10/12/2022.  FINDINGS: Brain: Unchanged 5 mm enhancing lesion in the left frontal lobe. Similar surrounding FLAIR hyperintensity. No new enhancing lesions identified. Scattered T2/FLAIR hyperintensities in the white matter, compatible with chronic microvascular ischemic disease. Similar chronic microhemorrhages. No evidence of acute infarct, acute hemorrhage, midline shift or hydrocephalus. Sequela prior ventricular catheter in the right frontal lobe. Vascular: Major arterial flow voids are maintained at the skull base. Skull and upper cervical spine: Prior right burr hole. Otherwise, normal marrow signal. Sinuses/Orbits: Mild paranasal sinus mucosal thickening. No acute orbital findings. Other: No mastoid effusions. IMPRESSION: Unchanged 5 mm enhancing lesion in the left frontal lobe. No new metastasis identified. Electronically Signed   By: Margaretha Sheffield M.D.   On: 01/12/2023 14:33    Impression/Plan: 4. 72 yo woman with brain metastasis secondary to metastatic, NSCLC, adenocarcinoma of the left upper lung - Stage IVA. She  has recovered well from the effects of her previous radiotherapy and remains without complaints.  She continues on maintenance therapy with Alimta and Keytruda which she is tolerating well.   Her recent MRI brain scan from 01/11/2023 shows a stable appearance of the residual 5 mm focus of enhancement in the left frontal lobe (previously treated lesion) and no visible sequela of previously treated lesions in the left temporal lobe and left occipital lobe. There are no new lesions to suggest disease recurrence or progression.  She will continue management of her systemic disease under the care and direction of Dr. Earlie Server and is scheduled for a follow up visit on 02/14/23, likely following repeat systemic imaging.  Regarding the brain metastases, since her disease has remained stable for the past year, we will continue to monitor closely but can move to serial MRI brain scans every 4 months for the  remainder of this year with plans to move to 6 month scans thereafter if she remains stable. We will continue to connect by telephone following each scan to review the results and recommendations from the multidisciplinary brain tumor board.  She appears to have a good understanding of these recommendations and is comfortable and in agreement with the stated plan.  She knows to call at anytime in the interim with any questions or concerns related to her previous radiation.   I personally spent 20 minutes in this encounter including chart review, reviewing radiological studies, telephone discussion with the patient, entering orders and completing documentation.     Nicholos Johns, PA-C

## 2023-01-18 ENCOUNTER — Other Ambulatory Visit: Payer: Self-pay

## 2023-01-18 ENCOUNTER — Telehealth: Payer: Self-pay | Admitting: Medical Oncology

## 2023-01-18 DIAGNOSIS — E785 Hyperlipidemia, unspecified: Secondary | ICD-10-CM

## 2023-01-18 MED ORDER — ATORVASTATIN CALCIUM 40 MG PO TABS: 40.0000 mg | ORAL_TABLET | Freq: Every day | ORAL | 1 refills | Status: DC

## 2023-01-18 NOTE — Telephone Encounter (Signed)
LVM for pt to return my call re her request for an antibiotic.What are her symptoms?

## 2023-01-21 NOTE — Progress Notes (Deleted)
Jill [name]  Jill Feil, MD [place] [address]  DIAGNOSIS: Stage IV (T2 a, N2, M1b) non-small cell lung cancer, adenocarcinoma presented with left upper lobe lung mass in addition to left hilar and precarinal metastatic adenopathy and small left lower lobe pulmonary nodule in addition to solitary left frontal brain metastasis diagnosed in July 2022.   Molecular studies by [employer] [employer] showed  Positive KRAS G12C mutation PD-L1 expression was less than 1%  PRIOR THERAPY: 1) Status post SRS to the solitary brain metastasis under the care of Dr. Tammi Klippel. Completed on 06/23/21 2)  Treatment for the locally advanced disease in the chest with carboplatin for AUC of 2 and paclitaxel 45 Mg/M2.  Status post 6 cycles.  Last dose was given 07/25/2021. 3) SRS to the additional metastatic brain lesions on 09/28/22  CURRENT THERAPY: Systemic chemotherapy with carboplatin for AUC of 5, Alimta 500 Mg/M2 and Keytruda 200 Mg IV every 3 weeks.  First dose August 31, 2021. Status post 23 cycles. Starting from cycle #5, she will start Alimta and keytruda maintenance IV every 3 weeks.    INTERVAL HISTORY: Jill [name] 72 y.o. female returns to the clinic today for a follow-up visit.  The patient is feeling fairly well today without any concerning complaints except for ***.  Increased fatigue, shortness of breath, rhinorrhea, decreased appetite, and cough.  Antibiotic?  She is currently undergoing chemotherapy and immunotherapy which she tolerates well except for the fatigue.  She also recently had a follow-up appointment with radiation oncology after having a brain MRI which was stable.  She denies any fever, chills, or unexplained night sweats.  Appetite and weight?  She denies any chest pain or hemoptysis.  Still smokes 1 pack of cigarettes per day?  Is she taking her inhalers?  Does not take Mucinex or cough suppressant because she wants to get  the phlegm out?  Denies any nausea, vomiting, diarrhea, or constipation.  Denies any rashes or skin changes.  Denies any headache or visual changes.  She is here today for evaluation repeat blood work before undergoing cycle #24.  MEDICAL HISTORY: Past Medical History:  Diagnosis Date   Anxiety    ON PAXIL, XANAX   AVM (arteriovenous malformation) brain    s/p stent/coil   Blood transfusion    x 2   Cancer (HCC)    Left lung   COPD (chronic obstructive pulmonary disease) (HCC)    no inhaler, no oxygen   Coronary artery disease    Prior inferior MI with stent to RCA, s/p CABG in 2008   Depression    Dizziness    Dyspnea    with exertion, no oxygen   Fatigue    Foot injury 06/01/2017   right - RESOLVED, no longer an issue per patient 05/18/21   GERD (gastroesophageal reflux disease)    Headache(784.0)    UNRUPTURED CEREBRAL ANEURYSM   Hyperlipidemia    Hypertension    Hypothyroidism    Infected cyst of skin 09/18/2013   Left knee pain 06/05/2012   Lung mass    Left lung   Myocardial infarction ([place])    Normal nuclear stress test Ju;y 2012   No ischemia. EF 70%; fixed defect involving septum, inferoseptal and inferior wall   Pain, dental 02/06/2017   Poor dental hygiene    Retroperitoneal bleeding    Following cardiac cath   Tobacco abuse    Urine discoloration 09/18/2013  Urine incontinence 07/06/2020   UTI (lower urinary tract infection) 10/16/2013    ALLERGIES:  is allergic to varenicline, ace inhibitors, prednisone, betalin 12 [vitamin b12], penicillins, and sulfa drugs cross reactors.  MEDICATIONS:  Current Outpatient Medications  Medication Sig Dispense Refill   acetaminophen (TYLENOL) 500 MG tablet Take 500 mg by mouth every 6 (six) hours as needed.     amLODipine (NORVASC) 10 MG tablet TAKE 1 TABLET BY MOUTH ONCE DAILY . APPOINTMENT REQUIRED FOR FUTURE REFILLS 90 tablet 1   aspirin EC 81 MG tablet Take 1 tablet (81 mg total) by mouth daily. 90 tablet 2    atorvastatin (LIPITOR) 40 MG tablet Take 1 tablet (40 mg total) by mouth daily. 90 tablet 1   Cholecalciferol (VITAMIN D3) 250 MCG (10000 UT) capsule Take 10,000 mcg by mouth daily.     diphenhydrAMINE HCl (BENADRYL ALLERGY PO) Take 25 mg by mouth at bedtime as needed (for sleep).     escitalopram (LEXAPRO) 10 MG tablet Take 1 tablet (10 mg total) by mouth daily. 90 tablet 1   folic acid (FOLVITE) 1 MG tablet Take 1 tablet (1 mg total) by mouth daily. 90 tablet 0   levothyroxine (SYNTHROID) 150 MCG tablet Take 1 tablet (150 mcg total) by mouth daily before breakfast. 90 tablet 0   lidocaine-prilocaine (EMLA) cream Apply 1 Application topically as needed. 30 g 2   losartan (COZAAR) 100 MG tablet Take 1 tablet (100 mg total) by mouth daily. 90 tablet 1   metoprolol succinate (TOPROL-XL) 50 MG 24 hr tablet TAKE 1 TABLET BY MOUTH ONCE DAILY WITH MEALS OR  IMMEDIATLEY  AFTER  A  MEAL 90 tablet 1   Multiple Vitamins-Minerals (MULTIVITAMIN WITH MINERALS) tablet Take 1 tablet by mouth daily.     nitroGLYCERIN (NITROSTAT) 0.4 MG SL tablet Place 1 tablet (0.4 mg total) under the tongue every 5 (five) minutes as needed. For chest pain (Patient not taking: Reported on 10/26/2021) 25 tablet 6   potassium chloride SA (KLOR-CON M) 20 MEQ tablet Take 1 tablet (20 mEq total) by mouth 2 (two) times daily. 7 tablet 0   prochlorperazine (COMPAZINE) 10 MG tablet TAKE 1 TABLET BY MOUTH EVERY 6 HOURS AS NEEDED FOR NAUSEA FOR VOMITING 30 tablet 0   Vitamin A 2400 MCG (8000 UT) TABS Take 2,400 mg by mouth daily.     No current facility-administered medications for this visit.    SURGICAL HISTORY:  Past Surgical History:  Procedure Laterality Date   CARDIAC CATHETERIZATION  01/02/2007   IT REVEALS MILD INFERIOR WALL HYPOKINESIS. THE EJECTION FRACTION IS AROUND 50%   COLONOSCOPY     CORONARY ARTERY BYPASS GRAFT  11/06/2006   LIMA to LAD, SVG to DX, SVG to LCX & SVG to OM 1 & 2, and SVG to PD   CORONARY STENT  PLACEMENT     Remote past stent to RCA   EYE SURGERY Right    cataracts removed   HEMORRHOID SURGERY  11/07/1987   IR IMAGING GUIDED PORT INSERTION  10/04/2022   UPPER GI ENDOSCOPY     VENTRICULOSTOMY  10/06/2011   Procedure: VENTRICULOSTOMY;  Surgeon: Winfield Cunas;  Location: [facility];  Service: Neurosurgery;  Laterality: Right;  Insertion of Ventriculostomy Catheter   VIDEO BRONCHOSCOPY WITH ENDOBRONCHIAL NAVIGATION Left 05/20/2021   Procedure: VIDEO BRONCHOSCOPY WITH ENDOBRONCHIAL NAVIGATION;  Surgeon: Garner Nash, DO;  Location: [facility];  Service: Pulmonary;  Laterality: Left;   VIDEO BRONCHOSCOPY WITH ENDOBRONCHIAL ULTRASOUND Bilateral  05/20/2021   Procedure: VIDEO BRONCHOSCOPY WITH ENDOBRONCHIAL ULTRASOUND;  Surgeon: Garner Nash, DO;  Location: [facility];  Service: Pulmonary;  Laterality: Bilateral;   WISDOM TOOTH EXTRACTION      REVIEW OF SYSTEMS:   Review of Systems  Constitutional: Negative for appetite change, chills, fatigue, fever and unexpected weight change.  HENT:   Negative for mouth sores, nosebleeds, sore throat and trouble swallowing.   Eyes: Negative for eye problems and icterus.  Respiratory: Negative for cough, hemoptysis, shortness of breath and wheezing.   Cardiovascular: Negative for chest pain and leg swelling.  Gastrointestinal: Negative for abdominal pain, constipation, diarrhea, nausea and vomiting.  Genitourinary: Negative for bladder incontinence, difficulty urinating, dysuria, frequency and hematuria.   Musculoskeletal: Negative for back pain, gait problem, neck pain and neck stiffness.  Skin: Negative for itching and rash.  Neurological: Negative for dizziness, extremity weakness, gait problem, headaches, light-headedness and seizures.  Hematological: Negative for adenopathy. Does not bruise/bleed easily.  Psychiatric/Behavioral: Negative for confusion, depression and sleep disturbance. The patient is not nervous/anxious.     PHYSICAL  EXAMINATION:  There were no vitals taken for this visit.  ECOG PERFORMANCE STATUS: {CHL ONC ECOG [id number]  Physical Exam  Constitutional: Oriented to person, place, and time and well-developed, well-nourished, and in no distress. No distress.  HENT:  Head: Normocephalic and atraumatic.  Mouth/Throat: Oropharynx is clear and moist. No oropharyngeal exudate.  Eyes: Conjunctivae are normal. Right eye exhibits no discharge. Left eye exhibits no discharge. No scleral icterus.  Neck: Normal range of motion. Neck supple.  Cardiovascular: Normal rate, regular rhythm, normal heart sounds and intact distal pulses.   Pulmonary/Chest: Effort normal and breath sounds normal. No respiratory distress. No wheezes. No rales.  Abdominal: Soft. Bowel sounds are normal. Exhibits no distension and no mass. There is no tenderness.  Musculoskeletal: Normal range of motion. Exhibits no edema.  Lymphadenopathy:    No cervical adenopathy.  Neurological: Alert and oriented to person, place, and time. Exhibits normal muscle tone. Gait normal. Coordination normal.  Skin: Skin is warm and dry. No rash noted. Not diaphoretic. No erythema. No pallor.  Psychiatric: Mood, memory and judgment normal.  Vitals reviewed.  LABORATORY DATA: Lab Results  Component Value Date   WBC 7.1 01/03/2023   HGB 11.5 (L) 01/03/2023   HCT 34.2 (L) 01/03/2023   MCV 104.9 (H) 01/03/2023   PLT 382 01/03/2023      Chemistry      Component Value Date/Time   NA 140 01/03/2023 0829   NA 141 04/12/2021 1356   K 3.1 (L) 01/03/2023 0829   CL 102 01/03/2023 0829   CO2 32 01/03/2023 0829   BUN 8 01/03/2023 0829   BUN 13 04/12/2021 1356   CREATININE 0.99 01/03/2023 0829   CREATININE 0.99 08/29/2016 1130      Component Value Date/Time   CALCIUM 9.0 01/03/2023 0829   ALKPHOS 75 01/03/2023 0829   AST 28 01/03/2023 0829   ALT 14 01/03/2023 0829   BILITOT 0.3 01/03/2023 0829       RADIOGRAPHIC STUDIES:  MR Brain W Wo  Contrast  Result Date: 01/12/2023 CLINICAL DATA:  Brain metastases, assess treatment response 3T SRS Protocol EXAM: MRI HEAD WITHOUT AND WITH CONTRAST TECHNIQUE: Multiplanar, multiecho pulse sequences of the brain and surrounding structures were obtained without and with intravenous contrast. CONTRAST:  8 ml [employer] COMPARISON:  MRI head 10/12/2022. FINDINGS: Brain: Unchanged 5 mm enhancing lesion in the left frontal lobe. Similar surrounding FLAIR hyperintensity. No  new enhancing lesions identified. Scattered T2/FLAIR hyperintensities in the white matter, compatible with chronic microvascular ischemic disease. Similar chronic microhemorrhages. No evidence of acute infarct, acute hemorrhage, midline shift or hydrocephalus. Sequela prior ventricular catheter in the right frontal lobe. Vascular: Major arterial flow voids are maintained at the skull base. Skull and upper cervical spine: Prior right burr hole. Otherwise, normal marrow signal. Sinuses/Orbits: Mild paranasal sinus mucosal thickening. No acute orbital findings. Other: No mastoid effusions. IMPRESSION: Unchanged 5 mm enhancing lesion in the left frontal lobe. No new metastasis identified. Electronically Signed   By: Margaretha Sheffield M.D.   On: 01/12/2023 14:33     ASSESSMENT/PLAN:  This is a very pleasant 72 year old Caucasian female diagnosed with stage IV (T2 a, N2, M1 B) non-small cell lung cancer, adenocarcinoma.  She presented with a left upper lobe lung mass in addition to left hilar and precarinal metastatic adenopathy as well as a small left lower lobe pulmonary nodule and a solitary left frontal brain metastasis.  This was diagnosed in July 2022.  Her molecular studies show that she is positive for K-ras G12C mutation and her PD-L1 expression is negative.    She completed SRS to the solitary brain metastasis under the care of Dr. Tammi Klippel which was completed on 06/23/2021.  She had a repeat brain MRI performed on 09/15/2021 which shows a new 1  mm and 3 mm metastatic brain lesions.  She received SRS on 09/28/2021.    She completed concurrent chemoradiation for the locally advanced disease in the chest with carboplatin for an AUC of 2 and paclitaxel 45 mg per metered squared.  She is status post 6 cycles.  She had been tolerating treatment well without any concerning adverse side effects.  The patient is currently undergoing systemic chemotherapy with carboplatin AUC 5, Alimta 500 mg per metered squared, Keytruda 20 mg IV every 3 weeks.  She is status post 23 cycles.  She started maintenance alimta and Keytruda starting from cycle #5.        Labs were reviewed.  Recommend that she proceed cycle #24 today scheduled.   I will arrange for restaging CT scan of the chest, abdomen, pelvis prior to her next appointment.  Antibiotic?  We will see her back for follow-up visit in 3 weeks for evaluation and to review her scan results before undergoing cycle number 25         No orders of the defined types were placed in this encounter.    I spent {CHL ONC TIME VISIT - [id number] counseling the patient face to face. The total time spent in the appointment was {CHL ONC TIME VISIT - [id number].  Meliah Appleman L Falan Hensler, PA-C 01/21/23

## 2023-01-22 NOTE — Telephone Encounter (Signed)
Patient has appt on 3/27. Jill [name], CMA

## 2023-01-24 ENCOUNTER — Telehealth: Payer: Self-pay

## 2023-01-24 ENCOUNTER — Inpatient Hospital Stay: Payer: Medicare HMO | Admitting: Physician Assistant

## 2023-01-24 ENCOUNTER — Inpatient Hospital Stay: Payer: Medicare HMO

## 2023-01-24 ENCOUNTER — Other Ambulatory Visit: Payer: Self-pay | Admitting: Physician Assistant

## 2023-01-24 DIAGNOSIS — C3412 Malignant neoplasm of upper lobe, left bronchus or lung: Secondary | ICD-10-CM

## 2023-01-24 NOTE — Telephone Encounter (Signed)
This nurse received a call from this patient stating that she has Bronchitis and she will not be coming in for her appointments today.  She also request the provider to call in an antibiotic for her Bronchitis.  This nurse forwarded request to provider.  No further concerns noted at this time.

## 2023-01-24 NOTE — Telephone Encounter (Signed)
This nurse reached back out to patient and advised that provider recommends that patient keeps appointment for the office visit so that she can be evaluated.  Patient refused and states that she does not feel like coming in.  This nurse advised per the provider that she needs to be evaluated  and suggested primary care physician or urgent care.  Patient states that she will see if she can get to the urgent care close to her home.  This nurse acknowledged and made provider aware.

## 2023-01-26 NOTE — Progress Notes (Unsigned)
[facility] OFFICE PROGRESS NOTE  Jill Feil, MD [place] [address]  DIAGNOSIS: Stage IV (T2 a, N2, M1b) non-small cell lung cancer, adenocarcinoma presented with left upper lobe lung mass in addition to left hilar and precarinal metastatic adenopathy and small left lower lobe pulmonary nodule in addition to solitary left frontal brain metastasis diagnosed in July 2022.   Molecular studies by [employer] showed  Positive KRAS G12C mutation PD-L1 expression was less than 1%  PRIOR THERAPY: 1) Status post SRS to the solitary brain metastasis under the care of Dr. Tammi Klippel. Completed on 06/23/21 2)  Treatment for the locally advanced disease in the chest with carboplatin for AUC of 2 and paclitaxel 45 Mg/M2.  Status post 6 cycles.  Last dose was given 07/25/2021. 3) SRS to the additional metastatic brain lesions on 09/28/22  CURRENT THERAPY: Systemic chemotherapy with carboplatin for AUC of 5, Alimta 500 Mg/M2 and Keytruda 200 Mg IV every 3 weeks.  First dose August 31, 2021. Status post 23 cycles. Starting from cycle #5, she will start Alimta and keytruda maintenance IV every 3 weeks.    INTERVAL HISTORY: Jill [name] 72 y.o. female returns to the clinic today for a follow-up visit.  The patient is reporting bronchitis today. She states she gets this every year. She has an appointment with her PCP at 1:30 today to be evaluated for this. She states this has been occurring for a few weeks and is getting better. She has not been taking any antihistamines for her nasal congestion. She has not been taking mucinex or any cough suppressant for her cough. She does not have any rescue inhalers or inhalers for her COPD.  States she produces clear to yellow/green phlegm.  Denies any associated fevers.  She is currently undergoing chemotherapy and immunotherapy which she tolerates well except for the fatigue.  She states she feels well enough for treatment  today.  She also recently had a follow-up appointment with radiation oncology earlier this month after having a brain MRI which was stable.  She denies any fever. She states she gets cold after she eats but otherwise denies any chills.  She denies any chest pain or hemoptysis.  She still continues to smoke cigarettes.  Denies any diarrhea or constipation.  She may have emesis after having a coughing episode.  She reports some dry skin on her face and on her elbows bilaterally.  Denies any headache or visual changes.  She is here today for evaluation repeat blood work before undergoing cycle #24.   MEDICAL HISTORY: Past Medical History:  Diagnosis Date   Anxiety    ON PAXIL, XANAX   AVM (arteriovenous malformation) brain    s/p stent/coil   Blood transfusion    x 2   Cancer (HCC)    Left lung   COPD (chronic obstructive pulmonary disease) (HCC)    no inhaler, no oxygen   Coronary artery disease    Prior inferior MI with stent to RCA, s/p CABG in 2008   Depression    Dizziness    Dyspnea    with exertion, no oxygen   Fatigue    Foot injury 06/01/2017   right - RESOLVED, no longer an issue per patient 05/18/21   GERD (gastroesophageal reflux disease)    Headache(784.0)    UNRUPTURED CEREBRAL ANEURYSM   Hyperlipidemia    Hypertension    Hypothyroidism    Infected cyst of skin 09/18/2013   Left  knee pain 06/05/2012   Lung mass    Left lung   Myocardial infarction [facility])    Normal nuclear stress test Ju;y 2012   No ischemia. EF 70%; fixed defect involving septum, inferoseptal and inferior wall   Pain, dental 02/06/2017   Poor dental hygiene    Retroperitoneal bleeding    Following cardiac cath   Tobacco abuse    Urine discoloration 09/18/2013   Urine incontinence 07/06/2020   UTI (lower urinary tract infection) 10/16/2013    ALLERGIES:  is allergic to varenicline, ace inhibitors, prednisone, betalin 12 [vitamin b12], penicillins, and sulfa drugs cross reactors.  MEDICATIONS:   Current Outpatient Medications  Medication Sig Dispense Refill   hydrocortisone cream 0.5 % Apply 1 Application topically 2 (two) times daily. As needed for itching. 30 g 0   acetaminophen (TYLENOL) 500 MG tablet Take 500 mg by mouth every 6 (six) hours as needed.     amLODipine (NORVASC) 10 MG tablet TAKE 1 TABLET BY MOUTH ONCE DAILY . APPOINTMENT REQUIRED FOR FUTURE REFILLS 90 tablet 1   aspirin EC 81 MG tablet Take 1 tablet (81 mg total) by mouth daily. 90 tablet 2   atorvastatin (LIPITOR) 40 MG tablet Take 1 tablet (40 mg total) by mouth daily. 90 tablet 1   Cholecalciferol (VITAMIN D3) 250 MCG (10000 UT) capsule Take 10,000 mcg by mouth daily.     diphenhydrAMINE HCl (BENADRYL ALLERGY PO) Take 25 mg by mouth at bedtime as needed (for sleep).     escitalopram (LEXAPRO) 10 MG tablet Take 1 tablet (10 mg total) by mouth daily. 90 tablet 1   folic acid (FOLVITE) 1 MG tablet Take 1 tablet (1 mg total) by mouth daily. 90 tablet 0   levothyroxine (SYNTHROID) 150 MCG tablet Take 1 tablet (150 mcg total) by mouth daily before breakfast. 90 tablet 0   lidocaine-prilocaine (EMLA) cream Apply 1 Application topically as needed. 30 g 2   losartan (COZAAR) 100 MG tablet Take 1 tablet (100 mg total) by mouth daily. 90 tablet 1   metoprolol succinate (TOPROL-XL) 50 MG 24 hr tablet TAKE 1 TABLET BY MOUTH ONCE DAILY WITH MEALS OR  IMMEDIATLEY  AFTER  A  MEAL 90 tablet 1   Multiple Vitamins-Minerals (MULTIVITAMIN WITH MINERALS) tablet Take 1 tablet by mouth daily.     nitroGLYCERIN (NITROSTAT) 0.4 MG SL tablet Place 1 tablet (0.4 mg total) under the tongue every 5 (five) minutes as needed. For chest pain (Patient not taking: Reported on 10/26/2021) 25 tablet 6   potassium chloride SA (KLOR-CON M) 20 MEQ tablet Take 1 tablet (20 mEq total) by mouth 2 (two) times daily. 7 tablet 0   prochlorperazine (COMPAZINE) 10 MG tablet TAKE 1 TABLET BY MOUTH EVERY 6 HOURS AS NEEDED FOR NAUSEA FOR VOMITING 30 tablet 0    Vitamin A 2400 MCG (8000 UT) TABS Take 2,400 mg by mouth daily.     No current facility-administered medications for this visit.    SURGICAL HISTORY:  Past Surgical History:  Procedure Laterality Date   CARDIAC CATHETERIZATION  01/02/2007   IT REVEALS MILD INFERIOR WALL HYPOKINESIS. THE EJECTION FRACTION IS AROUND 50%   COLONOSCOPY     CORONARY ARTERY BYPASS GRAFT  11/06/2006   LIMA to LAD, SVG to DX, SVG to LCX & SVG to OM 1 & 2, and SVG to PD   CORONARY STENT PLACEMENT     Remote past stent to RCA   EYE SURGERY Right    cataracts  removed   HEMORRHOID SURGERY  11/07/1987   IR IMAGING GUIDED PORT INSERTION  10/04/2022   UPPER GI ENDOSCOPY     VENTRICULOSTOMY  10/06/2011   Procedure: VENTRICULOSTOMY;  Surgeon: Winfield Cunas;  Location: [facility];  Service: Neurosurgery;  Laterality: Right;  Insertion of Ventriculostomy Catheter   VIDEO BRONCHOSCOPY WITH ENDOBRONCHIAL NAVIGATION Left 05/20/2021   Procedure: VIDEO BRONCHOSCOPY WITH ENDOBRONCHIAL NAVIGATION;  Surgeon: Garner Nash, DO;  Location: [facility];  Service: Pulmonary;  Laterality: Left;   VIDEO BRONCHOSCOPY WITH ENDOBRONCHIAL ULTRASOUND Bilateral 05/20/2021   Procedure: VIDEO BRONCHOSCOPY WITH ENDOBRONCHIAL ULTRASOUND;  Surgeon: Garner Nash, DO;  Location: [facility];  Service: Pulmonary;  Laterality: Bilateral;   WISDOM TOOTH EXTRACTION      REVIEW OF SYSTEMS:   Constitutional: Positive for fatigue. Negative for  fever. HENT: Positive for nasal congestion.  Negative for mouth sores, nosebleeds, sore throat and trouble swallowing.   Eyes: Negative for eye problems and icterus.  Respiratory: Positive for shortness of breath with exertion and cough.  Negative for hemoptysis and wheezing.   Cardiovascular: Negative for chest pain and leg swelling.  Gastrointestinal: Positive for emesis if having coughing episode.  Negative for abdominal pain, diarrhea, constipation, and nausea.  Genitourinary: Negative for bladder  incontinence, difficulty urinating, dysuria, frequency and hematuria.   Musculoskeletal: Negative for back pain, gait problem, neck pain and neck stiffness.  Skin: Positive for dry skin on her elbows bilaterally. Neurological: Negative for dizziness, extremity weakness, gait problem, light-headedness and seizures.  Hematological: Negative for adenopathy. Does not bruise/bleed easily.  Psychiatric/Behavioral: Negative for confusion, depression and sleep disturbance. The patient is not nervous/anxious   PHYSICAL EXAMINATION:  Blood pressure 123/61, pulse 85, temperature 98 F (36.7 C), temperature source Oral, resp. rate 18, height 5\' 7"  (1.702 m), weight 138 lb 9.6 oz (62.9 kg), SpO2 92 %.  ECOG PERFORMANCE STATUS: 1  Physical Exam  Constitutional: Oriented to person, place, and time and well-developed, well-nourished, and in no distress.  HENT:  Head: Normocephalic and atraumatic.  Mouth/Throat: Oropharynx is clear and moist. No oropharyngeal exudate.  Eyes: Conjunctivae are normal. Right eye exhibits no discharge. Left eye exhibits no discharge. No scleral icterus.  Neck: Normal range of motion. Neck supple.  Cardiovascular: Normal rate, regular rhythm, normal heart sounds and intact distal pulses.   Pulmonary/Chest: Effort normal.  Quiet breath sounds bilaterally.  No respiratory distress. No wheezes. No rales.  Abdominal: Soft. Bowel sounds are normal. Exhibits no distension and no mass. There is no tenderness.  Musculoskeletal: Normal range of motion. Exhibits no edema.  Lymphadenopathy:    No cervical adenopathy.  Neurological: Alert and oriented to person, place, and time. Exhibits also wasting. Gait normal. Coordination normal.  Skin: Skin is warm and dry.  Positive for dry skin on her elbows bilaterally.  Not diaphoretic. No erythema. No pallor.  Psychiatric: Mood, memory and judgment normal.  Vitals reviewed.  LABORATORY DATA: Lab Results  Component Value Date   WBC 6.4  01/31/2023   HGB 11.3 (L) 01/31/2023   HCT 34.3 (L) 01/31/2023   MCV 106.2 (H) 01/31/2023   PLT 247 01/31/2023      Chemistry      Component Value Date/Time   NA 140 01/03/2023 0829   NA 141 04/12/2021 1356   K 3.1 (L) 01/03/2023 0829   CL 102 01/03/2023 0829   CO2 32 01/03/2023 0829   BUN 8 01/03/2023 0829   BUN 13 04/12/2021 1356   CREATININE 0.99 01/03/2023  [id number]   CREATININE 0.99 08/29/2016 1130      Component Value Date/Time   CALCIUM 9.0 01/03/2023 0829   ALKPHOS 75 01/03/2023 0829   AST 28 01/03/2023 0829   ALT 14 01/03/2023 0829   BILITOT 0.3 01/03/2023 0829       RADIOGRAPHIC STUDIES:  MR Brain W Wo Contrast  Result Date: 01/12/2023 CLINICAL DATA:  Brain metastases, assess treatment response 3T SRS Protocol EXAM: MRI HEAD WITHOUT AND WITH CONTRAST TECHNIQUE: Multiplanar, multiecho pulse sequences of the brain and surrounding structures were obtained without and with intravenous contrast. CONTRAST:  8 ml [employer] COMPARISON:  MRI head 10/12/2022. FINDINGS: Brain: Unchanged 5 mm enhancing lesion in the left frontal lobe. Similar surrounding FLAIR hyperintensity. No new enhancing lesions identified. Scattered T2/FLAIR hyperintensities in the white matter, compatible with chronic microvascular ischemic disease. Similar chronic microhemorrhages. No evidence of acute infarct, acute hemorrhage, midline shift or hydrocephalus. Sequela prior ventricular catheter in the right frontal lobe. Vascular: Major arterial flow voids are maintained at the skull base. Skull and upper cervical spine: Prior right burr hole. Otherwise, normal marrow signal. Sinuses/Orbits: Mild paranasal sinus mucosal thickening. No acute orbital findings. Other: No mastoid effusions. IMPRESSION: Unchanged 5 mm enhancing lesion in the left frontal lobe. No new metastasis identified. Electronically Signed   By: Margaretha Sheffield M.D.   On: 01/12/2023 14:33     ASSESSMENT/PLAN:  This is a very pleasant 72 year old  Caucasian female diagnosed with stage IV (T2 a, N2, M1 B) non-small cell lung cancer, adenocarcinoma.  She presented with a left upper lobe lung mass in addition to left hilar and precarinal metastatic adenopathy as well as a small left lower lobe pulmonary nodule and a solitary left frontal brain metastasis.  This was diagnosed in July 2022.  Her molecular studies show that she is positive for K-ras G12C mutation and her PD-L1 expression is negative.    She completed SRS to the solitary brain metastasis under the care of Dr. Tammi Klippel which was completed on 06/23/2021.  She had a repeat brain MRI performed on 09/15/2021 which shows a new 1 mm and 3 mm metastatic brain lesions.  She received SRS on 09/28/2021.    She completed concurrent chemoradiation for the locally advanced disease in the chest with carboplatin for an AUC of 2 and paclitaxel 45 mg per metered squared.  She is status post 6 cycles.  She had been tolerating treatment well without any concerning adverse side effects.  The patient is currently undergoing systemic chemotherapy with carboplatin AUC 5, Alimta 500 mg per metered squared, Keytruda 20 mg IV every 3 weeks.  She is status post 23 cycles.  She started maintenance alimta and Keytruda starting from cycle #5.      I reviewed her symptoms with Dr. Julien Nordmann today.  The patient is scheduled to see her PCP for bronchitis later today.  I offered to perform a 6-minute walking test on the patient to see if she qualifies for any supplemental oxygen or desaturates when she walks.  She declined.  We will not delay her treatment any further this week and she will proceed with cycle #24 today as scheduled.  If the patient does not receive any antibiotics at her PCPs office today, we will let her know we be happy to send azithromycin to her pharmacy.  I also strongly encouraged the patient to discuss with her PCP about any maintenance or rescue inhalers for her COPD.  Otherwise I would encourage her to  follow-up  with her pulmonologist which she states she has not seen in over 2 years   I will arrange for restaging CT scan of the chest, abdomen, pelvis prior to her next appointment.  Also discussed that is okay to take an antihistamine if needed for allergies and nasal congestion.  Also discussed she may take Mucinex if needed for mucus.  We will see her back for follow-up visit in 3 weeks for evaluation and to review her scan results before undergoing cycle number 25  The patient was advised to call immediately if she has any concerning symptoms in the interval. The patient voices understanding of current disease status and treatment options and is in agreement with the current care plan. All questions were answered. The patient knows to call the clinic with any problems, questions or concerns. We can certainly see the patient much sooner if necessary   Orders Placed This Encounter  Procedures   CT Chest W Contrast    Standing Status:   Future    Standing Expiration Date:   01/31/2024    Order Specific Question:   If indicated for the ordered procedure, I authorize the administration of contrast media per Radiology protocol    Answer:   Yes    Order Specific Question:   Does the patient have a contrast media/X-ray dye allergy?    Answer:   No    Order Specific Question:   Preferred imaging location?    Answer:   [facility]   CT Abdomen Pelvis W Contrast    Standing Status:   Future    Standing Expiration Date:   01/31/2024    Order Specific Question:   If indicated for the ordered procedure, I authorize the administration of contrast media per Radiology protocol    Answer:   Yes    Order Specific Question:   Does the patient have a contrast media/X-ray dye allergy?    Answer:   No    Order Specific Question:   Preferred imaging location?    Answer:   [facility]    Order Specific Question:   Is Oral Contrast requested for this exam?    Answer:   Yes, Per Radiology  protocol     The total time spent in the appointment was 20-29 minutes.   Jd Mccaster L Savita Runner, PA-C 01/31/23

## 2023-01-29 ENCOUNTER — Other Ambulatory Visit: Payer: Self-pay | Admitting: Radiation Therapy

## 2023-01-29 DIAGNOSIS — C7931 Secondary malignant neoplasm of brain: Secondary | ICD-10-CM

## 2023-01-31 ENCOUNTER — Ambulatory Visit (INDEPENDENT_AMBULATORY_CARE_PROVIDER_SITE_OTHER): Payer: Medicare HMO | Admitting: Family Medicine

## 2023-01-31 ENCOUNTER — Inpatient Hospital Stay: Payer: Medicare HMO

## 2023-01-31 ENCOUNTER — Inpatient Hospital Stay (HOSPITAL_BASED_OUTPATIENT_CLINIC_OR_DEPARTMENT_OTHER): Payer: Medicare HMO | Admitting: Physician Assistant

## 2023-01-31 ENCOUNTER — Telehealth: Payer: Self-pay | Admitting: Radiation Therapy

## 2023-01-31 ENCOUNTER — Other Ambulatory Visit: Payer: Self-pay | Admitting: Radiation Therapy

## 2023-01-31 ENCOUNTER — Ambulatory Visit: Payer: Medicare HMO

## 2023-01-31 ENCOUNTER — Other Ambulatory Visit: Payer: Self-pay

## 2023-01-31 VITALS — BP 123/61 | HR 85 | Temp 98.0°F | Resp 18 | Ht 67.0 in | Wt 138.6 lb

## 2023-01-31 VITALS — BP 137/62 | HR 83 | Ht 67.0 in | Wt 139.0 lb

## 2023-01-31 DIAGNOSIS — Z5111 Encounter for antineoplastic chemotherapy: Secondary | ICD-10-CM

## 2023-01-31 DIAGNOSIS — R634 Abnormal weight loss: Secondary | ICD-10-CM | POA: Diagnosis not present

## 2023-01-31 DIAGNOSIS — F17210 Nicotine dependence, cigarettes, uncomplicated: Secondary | ICD-10-CM | POA: Diagnosis not present

## 2023-01-31 DIAGNOSIS — R0602 Shortness of breath: Secondary | ICD-10-CM | POA: Diagnosis not present

## 2023-01-31 DIAGNOSIS — J441 Chronic obstructive pulmonary disease with (acute) exacerbation: Secondary | ICD-10-CM | POA: Diagnosis not present

## 2023-01-31 DIAGNOSIS — Z880 Allergy status to penicillin: Secondary | ICD-10-CM | POA: Diagnosis not present

## 2023-01-31 DIAGNOSIS — C779 Secondary and unspecified malignant neoplasm of lymph node, unspecified: Secondary | ICD-10-CM | POA: Diagnosis not present

## 2023-01-31 DIAGNOSIS — Z9221 Personal history of antineoplastic chemotherapy: Secondary | ICD-10-CM | POA: Diagnosis not present

## 2023-01-31 DIAGNOSIS — C3412 Malignant neoplasm of upper lobe, left bronchus or lung: Secondary | ICD-10-CM

## 2023-01-31 DIAGNOSIS — Z888 Allergy status to other drugs, medicaments and biological substances status: Secondary | ICD-10-CM | POA: Diagnosis not present

## 2023-01-31 DIAGNOSIS — R630 Anorexia: Secondary | ICD-10-CM | POA: Diagnosis not present

## 2023-01-31 DIAGNOSIS — C7931 Secondary malignant neoplasm of brain: Secondary | ICD-10-CM

## 2023-01-31 DIAGNOSIS — Z5112 Encounter for antineoplastic immunotherapy: Secondary | ICD-10-CM | POA: Diagnosis not present

## 2023-01-31 DIAGNOSIS — E785 Hyperlipidemia, unspecified: Secondary | ICD-10-CM | POA: Diagnosis not present

## 2023-01-31 DIAGNOSIS — Z882 Allergy status to sulfonamides status: Secondary | ICD-10-CM | POA: Diagnosis not present

## 2023-01-31 DIAGNOSIS — R21 Rash and other nonspecific skin eruption: Secondary | ICD-10-CM

## 2023-01-31 DIAGNOSIS — R5383 Other fatigue: Secondary | ICD-10-CM | POA: Diagnosis not present

## 2023-01-31 DIAGNOSIS — E039 Hypothyroidism, unspecified: Secondary | ICD-10-CM | POA: Diagnosis not present

## 2023-01-31 DIAGNOSIS — J449 Chronic obstructive pulmonary disease, unspecified: Secondary | ICD-10-CM | POA: Diagnosis not present

## 2023-01-31 DIAGNOSIS — Z79899 Other long term (current) drug therapy: Secondary | ICD-10-CM | POA: Diagnosis not present

## 2023-01-31 DIAGNOSIS — Z8719 Personal history of other diseases of the digestive system: Secondary | ICD-10-CM | POA: Diagnosis not present

## 2023-01-31 DIAGNOSIS — R0981 Nasal congestion: Secondary | ICD-10-CM | POA: Diagnosis not present

## 2023-01-31 DIAGNOSIS — Z923 Personal history of irradiation: Secondary | ICD-10-CM | POA: Diagnosis not present

## 2023-01-31 DIAGNOSIS — Z87440 Personal history of urinary (tract) infections: Secondary | ICD-10-CM | POA: Diagnosis not present

## 2023-01-31 DIAGNOSIS — I252 Old myocardial infarction: Secondary | ICD-10-CM | POA: Diagnosis not present

## 2023-01-31 LAB — CBC WITH DIFFERENTIAL (CANCER CENTER ONLY)
Abs Immature Granulocytes: 0.01 10*3/uL (ref 0.00–0.07)
Basophils Absolute: 0.1 10*3/uL (ref 0.0–0.1)
Basophils Relative: 1 %
Eosinophils Absolute: 0.2 10*3/uL (ref 0.0–0.5)
Eosinophils Relative: 4 %
HCT: 34.3 % — ABNORMAL LOW (ref 36.0–46.0)
Hemoglobin: 11.3 g/dL — ABNORMAL LOW (ref 12.0–15.0)
Immature Granulocytes: 0 %
Lymphocytes Relative: 16 %
Lymphs Abs: 1 10*3/uL (ref 0.7–4.0)
MCH: 35 pg — ABNORMAL HIGH (ref 26.0–34.0)
MCHC: 32.9 g/dL (ref 30.0–36.0)
MCV: 106.2 fL — ABNORMAL HIGH (ref 80.0–100.0)
Monocytes Absolute: 0.6 10*3/uL (ref 0.1–1.0)
Monocytes Relative: 10 %
Neutro Abs: 4.4 10*3/uL (ref 1.7–7.7)
Neutrophils Relative %: 69 %
Platelet Count: 247 10*3/uL (ref 150–400)
RBC: 3.23 MIL/uL — ABNORMAL LOW (ref 3.87–5.11)
RDW: 14.8 % (ref 11.5–15.5)
WBC Count: 6.4 10*3/uL (ref 4.0–10.5)
nRBC: 0 % (ref 0.0–0.2)

## 2023-01-31 LAB — CMP (CANCER CENTER ONLY)
ALT: 11 U/L (ref 0–44)
AST: 22 U/L (ref 15–41)
Albumin: 3.8 g/dL (ref 3.5–5.0)
Alkaline Phosphatase: 66 U/L (ref 38–126)
Anion gap: 5 (ref 5–15)
BUN: 10 mg/dL (ref 8–23)
CO2: 33 mmol/L — ABNORMAL HIGH (ref 22–32)
Calcium: 9.3 mg/dL (ref 8.9–10.3)
Chloride: 102 mmol/L (ref 98–111)
Creatinine: 1.04 mg/dL — ABNORMAL HIGH (ref 0.44–1.00)
GFR, Estimated: 57 mL/min — ABNORMAL LOW (ref >60)
Glucose, Bld: 144 mg/dL — ABNORMAL HIGH (ref 70–99)
Potassium: 3.5 mmol/L (ref 3.5–5.1)
Sodium: 140 mmol/L (ref 135–145)
Total Bilirubin: 0.4 mg/dL (ref 0.3–1.2)
Total Protein: 7.2 g/dL (ref 6.5–8.1)

## 2023-01-31 LAB — TSH: TSH: 3.996 u[IU]/mL (ref 0.350–4.500)

## 2023-01-31 MED ORDER — SODIUM CHLORIDE 0.9 % IV SOLN
200.0000 mg | Freq: Once | INTRAVENOUS | Status: AC
  Administered 2023-01-31: 200 mg via INTRAVENOUS
  Filled 2023-01-31: qty 8

## 2023-01-31 MED ORDER — ALBUTEROL SULFATE (2.5 MG/3ML) 0.083% IN NEBU
2.5000 mg | INHALATION_SOLUTION | Freq: Once | RESPIRATORY_TRACT | Status: AC
  Administered 2023-01-31: 2.5 mg via RESPIRATORY_TRACT

## 2023-01-31 MED ORDER — SODIUM CHLORIDE 0.9 % IV SOLN: Freq: Once | INTRAVENOUS | Status: AC

## 2023-01-31 MED ORDER — DIPHENHYDRAMINE HCL 25 MG PO CAPS
25.0000 mg | ORAL_CAPSULE | Freq: Once | ORAL | Status: AC
  Administered 2023-01-31: 25 mg via ORAL
  Filled 2023-01-31: qty 1

## 2023-01-31 MED ORDER — CYANOCOBALAMIN 1000 MCG/ML IJ SOLN
1000.0000 ug | Freq: Once | INTRAMUSCULAR | Status: AC
  Administered 2023-01-31: 1000 ug via INTRAMUSCULAR
  Filled 2023-01-31: qty 1

## 2023-01-31 MED ORDER — SODIUM CHLORIDE 0.9% FLUSH
10.0000 mL | INTRAVENOUS | Status: DC | PRN
  Administered 2023-01-31: 10 mL

## 2023-01-31 MED ORDER — SODIUM CHLORIDE 0.9 % IV SOLN
500.0000 mg/m2 | Freq: Once | INTRAVENOUS | Status: AC
  Administered 2023-01-31: 900 mg via INTRAVENOUS
  Filled 2023-01-31: qty 20

## 2023-01-31 MED ORDER — IPRATROPIUM BROMIDE 0.02 % IN SOLN
0.5000 mg | Freq: Once | RESPIRATORY_TRACT | Status: AC
  Administered 2023-01-31: 0.5 mg via RESPIRATORY_TRACT

## 2023-01-31 MED ORDER — IPRATROPIUM-ALBUTEROL 0.5-2.5 (3) MG/3ML IN SOLN: 3.0000 mL | Freq: Once | RESPIRATORY_TRACT | Status: DC

## 2023-01-31 MED ORDER — HEPARIN SOD (PORK) LOCK FLUSH 100 UNIT/ML IV SOLN
500.0000 [IU] | Freq: Once | INTRAVENOUS | Status: AC | PRN
  Administered 2023-01-31: 500 [IU]

## 2023-01-31 MED ORDER — ESCITALOPRAM OXALATE 5 MG PO TABS: ORAL_TABLET | ORAL | 0 refills | Status: DC

## 2023-01-31 MED ORDER — DOXYCYCLINE HYCLATE 100 MG PO TABS: 100.0000 mg | ORAL_TABLET | Freq: Two times a day (BID) | ORAL | 0 refills | Status: AC

## 2023-01-31 MED ORDER — HYDROCORTISONE 0.5 % EX CREA: 1.0000 | TOPICAL_CREAM | Freq: Two times a day (BID) | CUTANEOUS | 0 refills | Status: DC

## 2023-01-31 MED ORDER — ALBUTEROL SULFATE HFA 108 (90 BASE) MCG/ACT IN AERS: 2.0000 | INHALATION_SPRAY | Freq: Four times a day (QID) | RESPIRATORY_TRACT | 2 refills | Status: AC | PRN

## 2023-01-31 MED ORDER — PROCHLORPERAZINE MALEATE 10 MG PO TABS
10.0000 mg | ORAL_TABLET | Freq: Once | ORAL | Status: AC
  Administered 2023-01-31: 10 mg via ORAL
  Filled 2023-01-31: qty 1

## 2023-01-31 MED ORDER — UMECLIDINIUM-VILANTEROL 62.5-25 MCG/ACT IN AEPB: 1.0000 | INHALATION_SPRAY | Freq: Every day | RESPIRATORY_TRACT | 1 refills | Status: DC

## 2023-01-31 MED ORDER — MIRTAZAPINE 15 MG PO TABS: 15.0000 mg | ORAL_TABLET | Freq: Every day | ORAL | 1 refills | Status: DC

## 2023-01-31 NOTE — Patient Instructions (Signed)
It was nice seeing you today. I have given you antibiotic for COPD exacerbation. Go to radiology department from here for chest xray. Please go to the ED if symptoms worsens. I would love to see you back in 2 weeks.  Also as discussed, we plan of tapering you off Lexapro to start Remeron 15 mg daily for depression and appetite stimulation.  You wil take Lexapro 5 mg daily for 14 days, then 5 mg every other day for 14 days and then 2.5 mg every other day for 14 days and then stop the medication. Please call if having any reaction to these medication switch.

## 2023-01-31 NOTE — Patient Instructions (Signed)
[facility]  Discharge Instructions: Thank you for choosing [facility] to provide your oncology and hematology care.   If you have a lab appointment with the Cancer Center, please go directly to the Cancer Center and check in at the registration area.   Wear comfortable clothing and clothing appropriate for easy access to any Portacath or PICC line.   We strive to give you quality time with your provider. You may need to reschedule your appointment if you arrive late (15 or more minutes).  Arriving late affects you and other patients whose appointments are after yours.  Also, if you miss three or more appointments without notifying the office, you may be dismissed from the clinic at the provider's discretion.      For prescription refill requests, have your pharmacy contact our office and allow 72 hours for refills to be completed.    Today you received the following chemotherapy and/or immunotherapy agents: Keytruda, Alimta.       To help prevent nausea and vomiting after your treatment, we encourage you to take your nausea medication as directed.  BELOW ARE SYMPTOMS THAT SHOULD BE REPORTED IMMEDIATELY: *FEVER GREATER THAN 100.4 F (38 C) OR HIGHER *CHILLS OR SWEATING *NAUSEA AND VOMITING THAT IS NOT CONTROLLED WITH YOUR NAUSEA MEDICATION *UNUSUAL SHORTNESS OF BREATH *UNUSUAL BRUISING OR BLEEDING *URINARY PROBLEMS (pain or burning when urinating, or frequent urination) *BOWEL PROBLEMS (unusual diarrhea, constipation, pain near the anus) TENDERNESS IN MOUTH AND THROAT WITH OR WITHOUT PRESENCE OF ULCERS (sore throat, sores in mouth, or a toothache) UNUSUAL RASH, SWELLING OR PAIN  UNUSUAL VAGINAL DISCHARGE OR ITCHING   Items with * indicate a potential emergency and should be followed up as soon as possible or go to the Emergency Department if any problems should occur.  Please show the CHEMOTHERAPY ALERT CARD or IMMUNOTHERAPY ALERT CARD  at check-in to the Emergency Department and triage nurse.  Should you have questions after your visit or need to cancel or reschedule your appointment, please contact [facility]  Dept: [phone]  and follow the prompts.  Office hours are 8:00 a.m. to 4:30 p.m. Monday - Friday. Please note that voicemails left after 4:00 p.m. may not be returned until the following business day.  We are closed weekends and major holidays. You have access to a nurse at all times for urgent questions. Please call the main number to the clinic Dept: [phone] and follow the prompts.   For any non-urgent questions, you may also contact your provider using MyChart. We now offer e-Visits for anyone 18 and older to request care online for non-urgent symptoms. For details visit [email or web address].   Also download the MyChart app! Go to the app store, search "MyChart", open the app, select , and log in with your MyChart username and password.   

## 2023-01-31 NOTE — Assessment & Plan Note (Signed)
Likely multifactorial - polypharmacy, chemotherapy, hx of lung cancer Unable to exercise due to SOB. Perhaps if her COPD improves she can start graded exercise. Monitor for now.

## 2023-01-31 NOTE — Assessment & Plan Note (Signed)
Oncology note and labs reviewed. F/U chemotherapy as planned.

## 2023-01-31 NOTE — Assessment & Plan Note (Signed)
May be related to chronic illness. She wishes to try Remeron. I discussed cross interaction and risk of taking with Lexapro. She wishes to taper Lexapro while starting Remeron. Tapering instruction provided. Remeron initiated.

## 2023-01-31 NOTE — Progress Notes (Signed)
    SUBJECTIVE:   CHIEF COMPLAINT / HPI:   The patient originally came in for AWV. However, due to her being sick, this was switched to a sick visit.  COPD: C/O cough, sputum production, dyspnea on exertion, wheezing, chest tightness x 2 weeks. No fever. She endorses difficulty lying flat on her back for a long period, but denies PND. No leg swelling. She has cut back on smoking to 1/2 pack per day. Last seen by her pulmonologist in 2021 and had been off Dulera or Symbicort since 2022 due to cost.  Weight loss/Poor appetite/Fatigue: She c/o worsening fatigue, poor appetite, and weight loss. She tries to eat at most two small meals daily. No other GI symptoms.   PERTINENT  PMH / PSH: PMHx reviewed  OBJECTIVE:   BP 137/62   Pulse 83   Ht 5\' 7"  (1.702 m)   Wt 139 lb (63 kg)   SpO2 99%   BMI 21.77 kg/m   Physical Exam Vitals and nursing note reviewed.  Cardiovascular:     Rate and Rhythm: Normal rate and regular rhythm.     Heart sounds: Normal heart sounds. No murmur heard. Pulmonary:     Effort: Pulmonary effort is normal.     Comments: Reduced breath sound B/L with faint wheezing.     ASSESSMENT/PLAN:   COPD exacerbation ([place]) Does not wish to go to the ED. PFT in 2017 reviewed - showed moderately severe obstructive lung disease. Recent CT chest reviewed. I discussed xray. However, she stated that her oncology requested a CT lung in the next week for her lung cancer and will wait for that instead. Duoneb treatment given during this visit. Doxycycline prescribed for 5 days. Steroid intolerance, hence prednisone was not prescribed. Started Anoro Ellipta and I also refilled her albuterol prn. Smoking cessation counseling and resources offered. She prefers to quit cold [place]. F/U in 1-2 weeks for reassessment. Consider ECHO if no improvement. Consider pulm referral as well. ED precautions discussed. She agreed with the plan.  Decreased appetite May be related to  chronic illness. She wishes to try Remeron. I discussed cross interaction and risk of taking with Lexapro. She wishes to taper Lexapro while starting Remeron. Tapering instruction provided. Remeron initiated.  Weight loss Due to reduced calorie intake. Initiate Remeron Monitor for improvement.  Fatigue Likely multifactorial - polypharmacy, chemotherapy, hx of lung cancer Unable to exercise due to SOB. Perhaps if her COPD improves she can start graded exercise. Monitor for now.  Primary adenocarcinoma of left upper lobe of lung [facility]) Oncology note and labs reviewed. F/U chemotherapy as planned.   More than 50% of this 50 minute face to face encounter was spent on acute COPD management, counseling and coordination of care.  Andrena Mews, MD [place]

## 2023-01-31 NOTE — Progress Notes (Signed)
Patient seen by PA today  Vitals are within treatment parameters.  Labs reviewed: and are within treatment parameters.  Per physician team, patient is ready for treatment and there are NO modifications to the treatment plan.  

## 2023-01-31 NOTE — Telephone Encounter (Signed)
I spoke with Jill [name] about her upcoming brain MRI and telephone follow-up in July. She would like her port accessed, order entered.   Mont Dutton R.T(R)(T) Radiation Special Procedures Navigator

## 2023-01-31 NOTE — Assessment & Plan Note (Signed)
Due to reduced calorie intake. Initiate Remeron Monitor for improvement.

## 2023-01-31 NOTE — Assessment & Plan Note (Addendum)
Does not wish to go to the ED. PFT in 2017 reviewed - showed moderately severe obstructive lung disease. Recent CT chest reviewed. I discussed xray. However, she stated that her oncology requested a CT lung in the next week for her lung cancer and will wait for that instead. Duoneb treatment given during this visit. Doxycycline prescribed for 5 days. Steroid intolerance, hence prednisone was not prescribed. Started Anoro Ellipta and I also refilled her albuterol prn. Smoking cessation counseling and resources offered. She prefers to quit cold [place]. F/U in 1-2 weeks for reassessment. Consider ECHO if no improvement. Consider pulm referral as well. ED precautions discussed. She agreed with the plan.

## 2023-02-01 LAB — T4: T4, Total: 12.1 ug/dL — ABNORMAL HIGH (ref 4.5–12.0)

## 2023-02-06 ENCOUNTER — Other Ambulatory Visit: Payer: Self-pay

## 2023-02-13 ENCOUNTER — Encounter: Payer: Self-pay | Admitting: Family Medicine

## 2023-02-13 ENCOUNTER — Ambulatory Visit (INDEPENDENT_AMBULATORY_CARE_PROVIDER_SITE_OTHER): Payer: Medicare HMO | Admitting: Family Medicine

## 2023-02-13 VITALS — BP 121/69 | HR 87 | Ht 66.0 in | Wt 137.0 lb

## 2023-02-13 DIAGNOSIS — Z0000 Encounter for general adult medical examination without abnormal findings: Secondary | ICD-10-CM

## 2023-02-13 DIAGNOSIS — I10 Essential (primary) hypertension: Secondary | ICD-10-CM

## 2023-02-13 DIAGNOSIS — Z23 Encounter for immunization: Secondary | ICD-10-CM

## 2023-02-13 DIAGNOSIS — Z1211 Encounter for screening for malignant neoplasm of colon: Secondary | ICD-10-CM | POA: Diagnosis not present

## 2023-02-13 DIAGNOSIS — R0609 Other forms of dyspnea: Secondary | ICD-10-CM | POA: Diagnosis not present

## 2023-02-13 DIAGNOSIS — J441 Chronic obstructive pulmonary disease with (acute) exacerbation: Secondary | ICD-10-CM | POA: Diagnosis not present

## 2023-02-13 MED ORDER — ALBUTEROL SULFATE (2.5 MG/3ML) 0.083% IN NEBU: 2.5000 mg | INHALATION_SOLUTION | Freq: Four times a day (QID) | RESPIRATORY_TRACT | 12 refills | Status: AC | PRN

## 2023-02-13 NOTE — Progress Notes (Signed)
Subjective:   Jill EasternCarol J [name] is a 72 y.o. female who presents for an Initial Medicare Annual Wellness Visit.  Review of Systems    Chronic cough - also having SOB on climbing the stairs. No leg edema. Uses Albuterol prn, but does not use her Anoro as instructed.       Objective:    Today's Vitals   02/13/23 1108 02/13/23 1120  BP: 121/69   Pulse: 87   SpO2: 99%   Weight: 137 lb (62.1 kg)   Height: 5\' 6"  (1.676 m)   PainSc: 0-No pain 5    Body mass index is 22.11 kg/m.     02/13/2023   11:12 AM 01/31/2023    1:41 PM 01/16/2023   10:12 AM 01/03/2023    8:48 AM 12/13/2022   10:04 AM 11/24/2022    3:35 PM 10/17/2022    1:25 PM  Advanced Directives  Does Patient Have a Medical Advance Directive? No No No No No No No  Would patient like information on creating a medical advance directive? No - Patient declined No - Patient declined  No - Patient declined No - Patient declined No - Patient declined     Current Medications (verified) Outpatient Encounter Medications as of 02/13/2023  Medication Sig   albuterol (PROVENTIL) (2.5 MG/3ML) 0.083% nebulizer solution Take 3 mLs (2.5 mg total) by nebulization every 6 (six) hours as needed for wheezing or shortness of breath.   amLODipine (NORVASC) 10 MG tablet TAKE 1 TABLET BY MOUTH ONCE DAILY . APPOINTMENT REQUIRED FOR FUTURE REFILLS   aspirin EC 81 MG tablet Take 1 tablet (81 mg total) by mouth daily.   atorvastatin (LIPITOR) 40 MG tablet Take 1 tablet (40 mg total) by mouth daily.   Cholecalciferol (VITAMIN D3) 250 MCG (10000 UT) capsule Take 10,000 mcg by mouth daily.   escitalopram (LEXAPRO) 5 MG tablet Take 1 tablet (5 mg total) by mouth daily for 14 days, THEN 1 tablet (5 mg total) every other day for 14 days, THEN 0.5 tablets (2.5 mg total) every other day for 14 days.   folic acid (FOLVITE) 1 MG tablet Take 1 tablet (1 mg total) by mouth daily.   levothyroxine (SYNTHROID) 150 MCG tablet Take 1 tablet (150 mcg total) by mouth daily  before breakfast.   losartan (COZAAR) 100 MG tablet Take 1 tablet (100 mg total) by mouth daily.   metoprolol succinate (TOPROL-XL) 50 MG 24 hr tablet TAKE 1 TABLET BY MOUTH ONCE DAILY WITH MEALS OR  IMMEDIATLEY  AFTER  A  MEAL   Multiple Vitamins-Minerals (MULTIVITAMIN WITH MINERALS) tablet Take 1 tablet by mouth daily.   umeclidinium-vilanterol (ANORO ELLIPTA) 62.5-25 MCG/ACT AEPB Inhale 1 puff into the lungs daily.   acetaminophen (TYLENOL) 500 MG tablet Take 500 mg by mouth every 6 (six) hours as needed. (Patient not taking: Reported on 01/31/2023)   albuterol (VENTOLIN HFA) 108 (90 Base) MCG/ACT inhaler Inhale 2 puffs into the lungs every 6 (six) hours as needed for wheezing or shortness of breath. (Patient not taking: Reported on 02/13/2023)   diphenhydrAMINE HCl (BENADRYL ALLERGY PO) Take 25 mg by mouth at bedtime as needed (for sleep). (Patient not taking: Reported on 01/31/2023)   hydrocortisone cream 0.5 % Apply 1 Application topically 2 (two) times daily. As needed for itching. (Patient not taking: Reported on 02/13/2023)   lidocaine-prilocaine (EMLA) cream Apply 1 Application topically as needed. (Patient not taking: Reported on 01/31/2023)   mirtazapine (REMERON) 15 MG tablet Take 1  tablet (15 mg total) by mouth at bedtime. (Patient not taking: Reported on 02/13/2023)   nitroGLYCERIN (NITROSTAT) 0.4 MG SL tablet Place 1 tablet (0.4 mg total) under the tongue every 5 (five) minutes as needed. For chest pain (Patient not taking: Reported on 10/26/2021)   prochlorperazine (COMPAZINE) 10 MG tablet TAKE 1 TABLET BY MOUTH EVERY 6 HOURS AS NEEDED FOR NAUSEA FOR VOMITING (Patient not taking: Reported on 02/13/2023)   Vitamin A 2400 MCG (8000 UT) TABS Take 2,400 mg by mouth daily. (Patient not taking: Reported on 01/31/2023)   No facility-administered encounter medications on file as of 02/13/2023.    Allergies (verified) Varenicline, Ace inhibitors, Prednisone, Betalin 12 [vitamin b12], Penicillins, and  Sulfa drugs cross reactors   History: Past Medical History:  Diagnosis Date   Anxiety    ON PAXIL, XANAX   AVM (arteriovenous malformation) brain    s/p stent/coil   Blood transfusion    x 2   Cancer    Left lung   COPD (chronic obstructive pulmonary disease)    no inhaler, no oxygen   Coronary artery disease    Prior inferior MI with stent to RCA, s/p CABG in 2008   Depression    Dizziness    Dyspnea    with exertion, no oxygen   Fatigue    Foot injury 06/01/2017   right - RESOLVED, no longer an issue per patient 05/18/21   GERD (gastroesophageal reflux disease)    Headache(784.0)    UNRUPTURED CEREBRAL ANEURYSM   Hyperlipidemia    Hypertension    Hypothyroidism    Infected cyst of skin 09/18/2013   Left knee pain 06/05/2012   Lung mass    Left lung   Myocardial infarction    Normal nuclear stress test Ju;y 2012   No ischemia. EF 70%; fixed defect involving septum, inferoseptal and inferior wall   Pain, dental 02/06/2017   Poor dental hygiene    Retroperitoneal bleeding    Following cardiac cath   Tobacco abuse    Urine discoloration 09/18/2013   Urine incontinence 07/06/2020   UTI (lower urinary tract infection) 10/16/2013   Past Surgical History:  Procedure Laterality Date   CARDIAC CATHETERIZATION  01/02/2007   IT REVEALS MILD INFERIOR WALL HYPOKINESIS. THE EJECTION FRACTION IS AROUND 50%   COLONOSCOPY     CORONARY ARTERY BYPASS GRAFT  11/06/2006   LIMA to LAD, SVG to DX, SVG to LCX & SVG to OM 1 & 2, and SVG to PD   CORONARY STENT PLACEMENT     Remote past stent to RCA   EYE SURGERY Right    cataracts removed   HEMORRHOID SURGERY  11/07/1987   IR IMAGING GUIDED PORT INSERTION  10/04/2022   UPPER GI ENDOSCOPY     VENTRICULOSTOMY  10/06/2011   Procedure: VENTRICULOSTOMY;  Surgeon: Carmela Hurt;  Location: [facility];  Service: Neurosurgery;  Laterality: Right;  Insertion of Ventriculostomy Catheter   VIDEO BRONCHOSCOPY WITH ENDOBRONCHIAL NAVIGATION  Left 05/20/2021   Procedure: VIDEO BRONCHOSCOPY WITH ENDOBRONCHIAL NAVIGATION;  Surgeon: Josephine Igo, DO;  Location: [facility] OR;  Service: Pulmonary;  Laterality: Left;   VIDEO BRONCHOSCOPY WITH ENDOBRONCHIAL ULTRASOUND Bilateral 05/20/2021   Procedure: VIDEO BRONCHOSCOPY WITH ENDOBRONCHIAL ULTRASOUND;  Surgeon: Josephine Igo, DO;  Location: [facility] OR;  Service: Pulmonary;  Laterality: Bilateral;   WISDOM TOOTH EXTRACTION     Family History  Problem Relation Age of Onset   Cancer Mother 37   Diabetes Mother  Alcohol abuse Father    Asthma Father    Diabetes Brother    Lung cancer Brother    Ovarian cancer Maternal Aunt    Liver cancer Paternal Aunt    Heart disease Neg Hx    Social History   Socioeconomic History   Marital status: Married    Spouse name: Not on file   Number of children: Not on file   Years of education: Not on file   Highest education level: Not on file  Occupational History   Not on file  Tobacco Use   Smoking status: Every Day    Packs/day: 0.50    Years: 58.00    Additional pack years: 0.00    Total pack years: 29.00    Types: Cigarettes    Start date: 11/06/1957   Smokeless tobacco: Never   Tobacco comments:    Has smoked 3 ppd, current 1.5-2 ppd    She will continue to work on cutting back on the amount of tobacco use  Vaping Use   Vaping Use: Never used  Substance and Sexual Activity   Alcohol use: No   Drug use: No   Sexual activity: Yes    Birth control/protection: Post-menopausal  Other Topics Concern   Not on file  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 worked in [employer] most recently.      Lives with husband and 2 adult daughters and 4 grandchildren.   Social Determinants of Health   Financial Resource Strain: Not on file  Food Insecurity: No Food Insecurity (02/13/2023)   Hunger Vital Sign    Worried About Running Out of Food in the Last  Year: Never true    Ran Out of Food in the Last Year: Never true  Transportation Needs: No Transportation Needs (02/13/2023)   PRAPARE - Administrator, Civil Service (Medical): No    Lack of Transportation (Non-Medical): No  Physical Activity: Inactive (02/13/2023)   Exercise Vital Sign    Days of Exercise per Week: 0 days    Minutes of Exercise per Session: 0 min  Stress: Not on file  Social Connections: Not on file    Tobacco Counseling Ready to quit: Yes Counseling given: Yes Tobacco comments: Has smoked 3 ppd, current 1.5-2 ppd She will continue to work on cutting back on the amount of tobacco use   Clinical Intake:  Pre-visit preparation completed: Yes  Pain : 0-10 Pain Score: 5  (Chronic back pain for many years.)     Nutritional Status: BMI of 19-24  Normal Nutritional Risks: Unintentional weight loss (Lost 2 lbs since last visit) Diabetes: No  How often do you need to have someone help you when you read instructions, pamphlets, or other written materials from your doctor or pharmacy?: 1 - Never What is the last grade level you completed in school?: 12TH GRADE  Diabetic?No hx  Interpreter Needed?: No      Activities of Daily Living    10/04/2022   12:38 PM  In your present state of health, do you have any difficulty performing the following activities:  Hearing? 0  Vision? 0  Difficulty concentrating or making decisions? 0  Walking or climbing stairs? 0  Dressing or bathing? 0    Patient Care Team: [name], MD as PCP - General (Family Medicine)  Indicate any recent Medical Services you may have received from other  than [facility] providers in the past year (date may be approximate).    Physical Exam Vitals and nursing note reviewed.  HENT:     Right Ear: Tympanic membrane normal.     Left Ear: Tympanic membrane normal.     Mouth/Throat:     Pharynx: No oropharyngeal exudate.     Comments: Missing teeth with dental caries.  Eyes:      Conjunctiva/sclera: Conjunctivae normal.     Pupils: Pupils are equal, round, and reactive to light.  Cardiovascular:     Rate and Rhythm: Normal rate.     Heart sounds: Normal heart sounds. No murmur heard. Pulmonary:     Effort: Pulmonary effort is normal. No respiratory distress.     Breath sounds: Normal breath sounds. No stridor.     Comments: Faint expiratory wheeze    Assessment:   This is a routine wellness examination for Jill [name].  Hearing/Vision screen Hearing Screening        Right ear Pass Pass Pass Pass  Left ear Pass Pass Pass Pass   Vision Screening   Right eye Left eye Both eyes  Without correction  With correction       Dietary issues and exercise activities discussed:     Goals Addressed             This Visit's Progress    Gain weight       I would like to gain weight to 160lbs. However, the plan is to eat 2-4 small meals per day to gain at least 2-4 lbs per month       Depression Screen    02/13/2023   11:28 AM 02/13/2023   11:14 AM 02/13/2023   11:08 AM 01/31/2023    1:41 PM 01/31/2023    1:40 PM 11/24/2022    3:34 PM 10/26/2021    9:54 AM  PHQ 2/9 Scores  PHQ - 2 Score 0     4 6  PHQ- 9 Score      15 14  Exception Documentation  Patient refusal Patient refusal Patient refusal Patient refusal      Fall Risk    02/13/2023   11:31 AM 02/13/2023   11:14 AM 02/13/2023   11:08 AM 01/31/2023    1:40 PM 11/24/2022    3:35 PM  Fall Risk   Falls in the past year? 0 0 0 0 0  Number falls in past yr: 0 0 0 0 0  Injury with Fall? 0 0 0 0 0  Risk for fall due to : No Fall Risks      Follow up     Falls evaluation completed    FALL RISK PREVENTION PERTAINING TO THE HOME:  Any stairs in or around the home? Yes  If so, are there any without handrails? No  Home free of loose throw rugs in walkways, pet beds, electrical cords, etc? Yes  Adequate lighting in your home to reduce risk of falls? Yes    ASSISTIVE DEVICES UTILIZED TO PREVENT FALLS:  Life alert? No  Use of a cane, walker or w/c? No  Grab bars in the bathroom? Yes  Shower chair or bench in shower? No  Elevated toilet seat or a handicapped toilet? No   TIMED UP AND GO:  Was the test performed? Yes .  Length of time to ambulate 10 feet: 8 sec.   Gait steady and fast without use of assistive device  Cognitive Function:  02/13/2023   11:30 AM  6CIT Screen  What Year? 0 points  What month? 0 points  What time? 0 points  Count back from 20 0 points  Months in reverse 0 points  Repeat phrase 0 points  Total Score 0 points    Mini-Cog - 02/13/23 1132     Normal clock drawing test? yes    How many words correct? 3             Immunizations Immunization History  Administered Date(s) Administered   COVID-19, mRNA, vaccine(Comirnaty)12 years and older 02/13/2023   Influenza,inj,Quad PF,6+ Mos 08/29/2016, 08/21/2018, 10/28/2019, 07/06/2020   PFIZER Comirnaty(Gray Top)Covid-19 Tri-Sucrose Vaccine 04/12/2021   PFIZER(Purple Top)SARS-COV-2 Vaccination 04/15/2020, 05/06/2020   PNEUMOCOCCAL CONJUGATE-20 04/12/2021   Pneumococcal Conjugate-13 08/29/2016   Pneumococcal Polysaccharide-23 01/24/2018   Tdap 06/05/2012    TDAP status: Due, Education has been provided regarding the importance of this vaccine. Advised may receive this vaccine at local pharmacy or Health Dept. Aware to provide a copy of the vaccination record if obtained from local pharmacy or Health Dept. Verbalized acceptance and understanding.  Flu Vaccine status: Declined, Education has been provided regarding the importance of this vaccine but patient still declined. Advised may receive this vaccine at local pharmacy or Health Dept. Aware to provide a copy of the vaccination record if obtained from local pharmacy or Health Dept. Verbalized acceptance and understanding. We are currently off flu season.  Pneumococcal vaccine status: Up to  date  Covid-19 vaccine status: Completed vaccines  Qualifies for Shingles Vaccine? Yes   Zostavax completed No   Shingrix Completed?: No.    Education has been provided regarding the importance of this vaccine. Patient has been advised to call insurance company to determine out of pocket expense if they have not yet received this vaccine. Advised may also receive vaccine at local pharmacy or Health Dept. Verbalized acceptance and understanding.  Screening Tests Health Maintenance  Topic Date Due   Medicare Annual Wellness (AWV)  Never done   Zoster Vaccines- Shingrix (1 of 2) Never done   DTaP/Tdap/Td (2 - Td or Tdap) 06/05/2022   DEXA SCAN  02/05/2024 (Originally 03/08/2014)   COVID-19 Vaccine (5 - 2023-24 season) 04/10/2023   INFLUENZA VACCINE  06/07/2023   Fecal DNA (Cologuard)  08/03/2023   Pneumonia Vaccine 57+ Years old  Completed   Hepatitis C Screening  Completed   HPV VACCINES  Aged Out    Health Maintenance  Health Maintenance Due  Topic Date Due   Medicare Annual Wellness (AWV)  Never done   Zoster Vaccines- Shingrix (1 of 2) Never done   DTaP/Tdap/Td (2 - Td or Tdap) 06/05/2022    Colorectal cancer screening: Type of screening: Cologuard. Completed 2021. Repeat every 3 years  Mammogram status: No longer required due to patient declined.  Dexa scan: Declined  Lung Cancer Screening: (Low Dose CT Chest recommended if Age 66-80 years, 30 pack-year currently smoking OR have quit w/in 15years.) does not qualify.   Lung Cancer Screening Referral: Previously completed and she is currently undergoing lung cancer treatment  Additional Screening:  Hepatitis C Screening: does qualify; Completed 2017  Vision Screening: Recommended annual ophthalmology exams for early detection of glaucoma and other disorders of the eye. Is the patient up to date with their annual eye exam?  No  Who is the provider or what is the name of the office in which the patient attends annual eye  exams? N/A If pt is not established with a  provider, would they like to be referred to a provider to establish care? No .   Dental Screening: Recommended annual dental exams for proper oral hygiene  Community Resource Referral / Chronic Care Management: CRR required this visit?  No   CCM required this visit?  No      Plan:     I have personally reviewed and noted the following in the patient's chart:   Medical and social history Use of alcohol, tobacco or illicit drugs  Current medications and supplements including opioid prescriptions. Patient is not currently taking opioid prescriptions. Functional ability and status Nutritional status Physical activity Advanced directives - form provided to her. List of other physicians Hospitalizations, surgeries, and ER visits in previous 12 months Vitals Screenings to include cognitive, depression, and falls Referrals and appointments  In addition, I have reviewed and discussed with patient certain preventive protocols, quality metrics, and best practice recommendations. A written personalized care plan for preventive services as well as general preventive health recommendations were provided to patient.   NB: Counseling provided regarding use of Anoro Ellipta.  Nebulizer machine was given during this visit - patient completed insurance form for this. Albuterol solution escribed.  ECHO ordered.  Janit Pagan, MD   02/13/2023

## 2023-02-13 NOTE — Patient Instructions (Signed)
Preventive Care 65 Years and Older, Female Preventive care refers to lifestyle choices and visits with your health care provider that can promote health and wellness. Preventive care visits are also called wellness exams. What can I expect for my preventive care visit? Counseling Your health care provider may ask you questions about your: Medical history, including: Past medical problems. Family medical history. Pregnancy and menstrual history. History of falls. Current health, including: Memory and ability to understand (cognition). Emotional well-being. Home life and relationship well-being. Sexual activity and sexual health. Lifestyle, including: Alcohol, nicotine or tobacco, and drug use. Access to firearms. Diet, exercise, and sleep habits. Work and work environment. Sunscreen use. Safety issues such as seatbelt and bike helmet use. Physical exam Your health care provider will check your: Height and weight. These may be used to calculate your BMI (body mass index). BMI is a measurement that tells if you are at a healthy weight. Waist circumference. This measures the distance around your waistline. This measurement also tells if you are at a healthy weight and may help predict your risk of certain diseases, such as type 2 diabetes and high blood pressure. Heart rate and blood pressure. Body temperature. Skin for abnormal spots. What immunizations do I need?  Vaccines are usually given at various ages, according to a schedule. Your health care provider will recommend vaccines for you based on your age, medical history, and lifestyle or other factors, such as travel or where you work. What tests do I need? Screening Your health care provider may recommend screening tests for certain conditions. This may include: Lipid and cholesterol levels. Hepatitis C test. Hepatitis B test. HIV (human immunodeficiency virus) test. STI (sexually transmitted infection) testing, if you are at  risk. Lung cancer screening. Colorectal cancer screening. Diabetes screening. This is done by checking your blood sugar (glucose) after you have not eaten for a while (fasting). Mammogram. Talk with your health care provider about how often you should have regular mammograms. BRCA-related cancer screening. This may be done if you have a family history of breast, ovarian, tubal, or peritoneal cancers. Bone density scan. This is done to screen for osteoporosis. Talk with your health care provider about your test results, treatment options, and if necessary, the need for more tests. Follow these instructions at home: Eating and drinking  Eat a diet that includes fresh fruits and vegetables, whole grains, lean protein, and low-fat dairy products. Limit your intake of foods with high amounts of sugar, saturated fats, and salt. Take vitamin and mineral supplements as recommended by your health care provider. Do not drink alcohol if your health care provider tells you not to drink. If you drink alcohol: Limit how much you have to 0-1 drink a day. Know how much alcohol is in your drink. In the [place] one drink equals one 12 oz bottle of beer (355 mL), one 5 oz glass of wine (148 mL), or one 1 oz glass of hard liquor (44 mL). Lifestyle Brush your teeth every morning and night with fluoride toothpaste. Floss one time each day. Exercise for at least 30 minutes 5 or more days each week. Do not use any products that contain nicotine or tobacco. These products include cigarettes, chewing tobacco, and vaping devices, such as e-cigarettes. If you need help quitting, ask your health care provider. Do not use drugs. If you are sexually active, practice safe sex. Use a condom or other form of protection in order to prevent STIs. Take aspirin only as told by   your health care provider. Make sure that you understand how much to take and what form to take. Work with your health care provider to find out whether it  is safe and beneficial for you to take aspirin daily. Ask your health care provider if you need to take a cholesterol-lowering medicine (statin). Find healthy ways to manage stress, such as: Meditation, yoga, or listening to music. Journaling. Talking to a trusted person. Spending time with friends and family. Minimize exposure to UV radiation to reduce your risk of skin cancer. Safety Always wear your seat belt while driving or riding in a vehicle. Do not drive: If you have been drinking alcohol. Do not ride with someone who has been drinking. When you are tired or distracted. While texting. If you have been using any mind-altering substances or drugs. Wear a helmet and other protective equipment during sports activities. If you have firearms in your house, make sure you follow all gun safety procedures. What's next? Visit your health care provider once a year for an annual wellness visit. Ask your health care provider how often you should have your eyes and teeth checked. Stay up to date on all vaccines. This information is not intended to replace advice given to you by your health care provider. Make sure you discuss any questions you have with your health care provider. Document Revised: 04/20/2021 Document Reviewed: 04/20/2021 Elsevier Patient Education  2023 [employer]   

## 2023-02-14 ENCOUNTER — Ambulatory Visit: Payer: Medicare HMO

## 2023-02-14 ENCOUNTER — Other Ambulatory Visit: Payer: Medicare HMO

## 2023-02-14 ENCOUNTER — Ambulatory Visit: Payer: Medicare HMO | Admitting: Internal Medicine

## 2023-02-14 ENCOUNTER — Telehealth: Payer: Self-pay | Admitting: Family Medicine

## 2023-02-14 LAB — LIPID PANEL
Chol/HDL Ratio: 3 ratio (ref 0.0–4.4)
Cholesterol, Total: 130 mg/dL (ref 100–199)
HDL: 43 mg/dL (ref >39)
LDL Chol Calc (NIH): 66 mg/dL (ref 0–99)
Triglycerides: 114 mg/dL (ref 0–149)
VLDL Cholesterol Cal: 21 mg/dL (ref 5–40)

## 2023-02-14 LAB — HEMOGLOBIN A1C
Est. average glucose Bld gHb Est-mCnc: 114 mg/dL
Hgb A1c MFr Bld: 5.6 % (ref 4.8–5.6)

## 2023-02-14 NOTE — Telephone Encounter (Signed)
HIPAA compliant callback message left. Please advise normal test results - Cholesterol looks good and A1C is neg for DM.

## 2023-02-16 ENCOUNTER — Other Ambulatory Visit: Payer: Self-pay | Admitting: Family Medicine

## 2023-02-16 DIAGNOSIS — E785 Hyperlipidemia, unspecified: Secondary | ICD-10-CM

## 2023-02-20 ENCOUNTER — Other Ambulatory Visit: Payer: Self-pay

## 2023-02-20 ENCOUNTER — Inpatient Hospital Stay: Payer: Medicare HMO

## 2023-02-20 ENCOUNTER — Inpatient Hospital Stay (HOSPITAL_BASED_OUTPATIENT_CLINIC_OR_DEPARTMENT_OTHER): Payer: Medicare HMO | Admitting: Internal Medicine

## 2023-02-20 ENCOUNTER — Encounter: Payer: Self-pay | Admitting: Medical Oncology

## 2023-02-20 ENCOUNTER — Inpatient Hospital Stay: Payer: Medicare HMO | Attending: Internal Medicine

## 2023-02-20 DIAGNOSIS — C3412 Malignant neoplasm of upper lobe, left bronchus or lung: Secondary | ICD-10-CM

## 2023-02-20 DIAGNOSIS — I252 Old myocardial infarction: Secondary | ICD-10-CM | POA: Insufficient documentation

## 2023-02-20 DIAGNOSIS — Z882 Allergy status to sulfonamides status: Secondary | ICD-10-CM | POA: Diagnosis not present

## 2023-02-20 DIAGNOSIS — Z5112 Encounter for antineoplastic immunotherapy: Secondary | ICD-10-CM | POA: Insufficient documentation

## 2023-02-20 DIAGNOSIS — J449 Chronic obstructive pulmonary disease, unspecified: Secondary | ICD-10-CM | POA: Diagnosis not present

## 2023-02-20 DIAGNOSIS — Z87440 Personal history of urinary (tract) infections: Secondary | ICD-10-CM | POA: Insufficient documentation

## 2023-02-20 DIAGNOSIS — Z79631 Long term (current) use of antimetabolite agent: Secondary | ICD-10-CM | POA: Diagnosis not present

## 2023-02-20 DIAGNOSIS — Z923 Personal history of irradiation: Secondary | ICD-10-CM | POA: Diagnosis not present

## 2023-02-20 DIAGNOSIS — Z79890 Hormone replacement therapy (postmenopausal): Secondary | ICD-10-CM | POA: Diagnosis not present

## 2023-02-20 DIAGNOSIS — Z8719 Personal history of other diseases of the digestive system: Secondary | ICD-10-CM | POA: Insufficient documentation

## 2023-02-20 DIAGNOSIS — Z888 Allergy status to other drugs, medicaments and biological substances status: Secondary | ICD-10-CM | POA: Diagnosis not present

## 2023-02-20 DIAGNOSIS — Z5111 Encounter for antineoplastic chemotherapy: Secondary | ICD-10-CM | POA: Insufficient documentation

## 2023-02-20 DIAGNOSIS — Z79899 Other long term (current) drug therapy: Secondary | ICD-10-CM | POA: Diagnosis not present

## 2023-02-20 DIAGNOSIS — C7931 Secondary malignant neoplasm of brain: Secondary | ICD-10-CM | POA: Insufficient documentation

## 2023-02-20 DIAGNOSIS — E785 Hyperlipidemia, unspecified: Secondary | ICD-10-CM | POA: Insufficient documentation

## 2023-02-20 DIAGNOSIS — Z79620 Long term (current) use of immunosuppressive biologic: Secondary | ICD-10-CM | POA: Diagnosis not present

## 2023-02-20 DIAGNOSIS — E039 Hypothyroidism, unspecified: Secondary | ICD-10-CM | POA: Insufficient documentation

## 2023-02-20 DIAGNOSIS — Z880 Allergy status to penicillin: Secondary | ICD-10-CM | POA: Diagnosis not present

## 2023-02-20 LAB — CBC WITH DIFFERENTIAL (CANCER CENTER ONLY)
Abs Immature Granulocytes: 0.01 10*3/uL (ref 0.00–0.07)
Basophils Absolute: 0.1 10*3/uL (ref 0.0–0.1)
Basophils Relative: 1 %
Eosinophils Absolute: 0.2 10*3/uL (ref 0.0–0.5)
Eosinophils Relative: 4 %
HCT: 33.2 % — ABNORMAL LOW (ref 36.0–46.0)
Hemoglobin: 11 g/dL — ABNORMAL LOW (ref 12.0–15.0)
Immature Granulocytes: 0 %
Lymphocytes Relative: 23 %
Lymphs Abs: 1.3 10*3/uL (ref 0.7–4.0)
MCH: 35.1 pg — ABNORMAL HIGH (ref 26.0–34.0)
MCHC: 33.1 g/dL (ref 30.0–36.0)
MCV: 106.1 fL — ABNORMAL HIGH (ref 80.0–100.0)
Monocytes Absolute: 0.6 10*3/uL (ref 0.1–1.0)
Monocytes Relative: 11 %
Neutro Abs: 3.3 10*3/uL (ref 1.7–7.7)
Neutrophils Relative %: 61 %
Platelet Count: 376 10*3/uL (ref 150–400)
RBC: 3.13 MIL/uL — ABNORMAL LOW (ref 3.87–5.11)
RDW: 14.4 % (ref 11.5–15.5)
WBC Count: 5.5 10*3/uL (ref 4.0–10.5)
nRBC: 0 % (ref 0.0–0.2)

## 2023-02-20 LAB — CMP (CANCER CENTER ONLY)
ALT: 11 U/L (ref 0–44)
AST: 22 U/L (ref 15–41)
Albumin: 3.5 g/dL (ref 3.5–5.0)
Alkaline Phosphatase: 67 U/L (ref 38–126)
Anion gap: 6 (ref 5–15)
BUN: 13 mg/dL (ref 8–23)
CO2: 30 mmol/L (ref 22–32)
Calcium: 9.1 mg/dL (ref 8.9–10.3)
Chloride: 105 mmol/L (ref 98–111)
Creatinine: 1.09 mg/dL — ABNORMAL HIGH (ref 0.44–1.00)
GFR, Estimated: 54 mL/min — ABNORMAL LOW (ref >60)
Glucose, Bld: 94 mg/dL (ref 70–99)
Potassium: 3.7 mmol/L (ref 3.5–5.1)
Sodium: 141 mmol/L (ref 135–145)
Total Bilirubin: 0.3 mg/dL (ref 0.3–1.2)
Total Protein: 7 g/dL (ref 6.5–8.1)

## 2023-02-20 MED ORDER — SODIUM CHLORIDE 0.9 % IV SOLN
500.0000 mg/m2 | Freq: Once | INTRAVENOUS | Status: AC
  Administered 2023-02-20: 900 mg via INTRAVENOUS
  Filled 2023-02-20: qty 20

## 2023-02-20 MED ORDER — PROCHLORPERAZINE MALEATE 10 MG PO TABS
10.0000 mg | ORAL_TABLET | Freq: Once | ORAL | Status: AC
  Administered 2023-02-20: 10 mg via ORAL
  Filled 2023-02-20: qty 1

## 2023-02-20 MED ORDER — SODIUM CHLORIDE 0.9% FLUSH
10.0000 mL | INTRAVENOUS | Status: DC | PRN
  Administered 2023-02-20: 10 mL

## 2023-02-20 MED ORDER — SODIUM CHLORIDE 0.9 % IV SOLN
200.0000 mg | Freq: Once | INTRAVENOUS | Status: AC
  Administered 2023-02-20: 200 mg via INTRAVENOUS
  Filled 2023-02-20: qty 200

## 2023-02-20 MED ORDER — SODIUM CHLORIDE 0.9 % IV SOLN: Freq: Once | INTRAVENOUS | Status: AC

## 2023-02-20 MED ORDER — HEPARIN SOD (PORK) LOCK FLUSH 100 UNIT/ML IV SOLN
500.0000 [IU] | Freq: Once | INTRAVENOUS | Status: AC | PRN
  Administered 2023-02-20: 500 [IU]

## 2023-02-20 NOTE — Patient Instructions (Signed)
[facility]  Discharge Instructions: Thank you for choosing [facility] to provide your oncology and hematology care.   If you have a lab appointment with the Cancer Center, please go directly to the Cancer Center and check in at the registration area.   Wear comfortable clothing and clothing appropriate for easy access to any Portacath or PICC line.   We strive to give you quality time with your provider. You may need to reschedule your appointment if you arrive late (15 or more minutes).  Arriving late affects you and other patients whose appointments are after yours.  Also, if you miss three or more appointments without notifying the office, you may be dismissed from the clinic at the provider's discretion.      For prescription refill requests, have your pharmacy contact our office and allow 72 hours for refills to be completed.    Today you received the following chemotherapy and/or immunotherapy agents: pembrolizumab and pemetrexed      To help prevent nausea and vomiting after your treatment, we encourage you to take your nausea medication as directed.  BELOW ARE SYMPTOMS THAT SHOULD BE REPORTED IMMEDIATELY: *FEVER GREATER THAN 100.4 F (38 C) OR HIGHER *CHILLS OR SWEATING *NAUSEA AND VOMITING THAT IS NOT CONTROLLED WITH YOUR NAUSEA MEDICATION *UNUSUAL SHORTNESS OF BREATH *UNUSUAL BRUISING OR BLEEDING *URINARY PROBLEMS (pain or burning when urinating, or frequent urination) *BOWEL PROBLEMS (unusual diarrhea, constipation, pain near the anus) TENDERNESS IN MOUTH AND THROAT WITH OR WITHOUT PRESENCE OF ULCERS (sore throat, sores in mouth, or a toothache) UNUSUAL RASH, SWELLING OR PAIN  UNUSUAL VAGINAL DISCHARGE OR ITCHING   Items with * indicate a potential emergency and should be followed up as soon as possible or go to the Emergency Department if any problems should occur.  Please show the CHEMOTHERAPY ALERT CARD or IMMUNOTHERAPY  ALERT CARD at check-in to the Emergency Department and triage nurse.  Should you have questions after your visit or need to cancel or reschedule your appointment, please contact [facility]  Dept: [phone]  and follow the prompts.  Office hours are 8:00 a.m. to 4:30 p.m. Monday - Friday. Please note that voicemails left after 4:00 p.m. may not be returned until the following business day.  We are closed weekends and major holidays. You have access to a nurse at all times for urgent questions. Please call the main number to the clinic Dept: [phone] and follow the prompts.   For any non-urgent questions, you may also contact your provider using MyChart. We now offer e-Visits for anyone 18 and older to request care online for non-urgent symptoms. For details visit [email or web address].   Also download the MyChart app! Go to the app store, search "MyChart", open the app, select [facility], and log in with your MyChart username and password.   

## 2023-02-20 NOTE — Progress Notes (Signed)
Patient seen by Dr. Gypsy Balsam are within treatment parameters.  Labs reviewed: and are within treatment parameters.  Per physician team, patient is ready for treatment and there are NO modifications to the treatment plan.  Keytruda and Pemetrexed.

## 2023-02-20 NOTE — Progress Notes (Signed)
[facility] [phone]   [phone]  OFFICE PROGRESS NOTE  [name], MD [address] [place] [address]  DIAGNOSIS: Stage IV (T2 a, N2, M1b) non-small cell lung cancer, adenocarcinoma presented with left upper lobe lung mass in addition to left hilar and precarinal metastatic adenopathy and small left lower lobe pulmonary nodule in addition to solitary left frontal brain metastasis diagnosed in July 2022.  Molecular studies by [employer] showed  Positive KRAS G12C mutation PD-L1 expression was less than 1%   PRIOR THERAPY:  1) Status post SRS to the solitary brain metastasis. 20 Treatment for the locally advanced disease in the chest with carboplatin for AUC of 2 and paclitaxel 45 Mg/M2.  Status post 6 cycles.  Last dose was given 07/25/2021.  CURRENT THERAPY: Systemic chemotherapy with carboplatin for AUC of 5, Alimta 500 Mg/M2 and Keytruda 200 Mg IV every 3 weeks.  First dose August 31, 2021.  Status post 24 cycles.  Starting from cycle #5 the patient will be on maintenance treatment with Alimta and Keytruda every 3 weeks.  INTERVAL HISTORY: Jill [name] 72 y.o. female returns to the clinic today for follow-up visit.  The patient is feeling fine today with no concerning complaints except for mild shortness of breath with exertion.  She is currently on several medication for her COPD including Proventil nebulizer as well as Ventolin and Anoro.  She denied having any chest pain has mild cough with no hemoptysis.  She denied having any fever or chills.  She has no nausea, vomiting, diarrhea or constipation.  She has no headache or visual changes.  She is here today for evaluation before starting cycle #25.  MEDICAL HISTORY: Past Medical History:  Diagnosis Date   Anxiety    ON PAXIL, XANAX   AVM (arteriovenous malformation) brain    s/p stent/coil   Blood transfusion    x 2   Cancer    Left lung   COPD (chronic  obstructive pulmonary disease)    no inhaler, no oxygen   Coronary artery disease    Prior inferior MI with stent to RCA, s/p CABG in 2008   Depression    Dizziness    Dyspnea    with exertion, no oxygen   Fatigue    Foot injury 06/01/2017   right - RESOLVED, no longer an issue per patient 05/18/21   GERD (gastroesophageal reflux disease)    Headache(784.0)    UNRUPTURED CEREBRAL ANEURYSM   Hyperlipidemia    Hypertension    Hypothyroidism    Infected cyst of skin 09/18/2013   Left knee pain 06/05/2012   Lung mass    Left lung   Myocardial infarction    Normal nuclear stress test Ju;y 2012   No ischemia. EF 70%; fixed defect involving septum, inferoseptal and inferior wall   Pain, dental 02/06/2017   Poor dental hygiene    Retroperitoneal bleeding    Following cardiac cath   Tobacco abuse    Urine discoloration 09/18/2013   Urine incontinence 07/06/2020   UTI (lower urinary tract infection) 10/16/2013    ALLERGIES:  is allergic to varenicline, ace inhibitors, prednisone, betalin 12 [vitamin b12], penicillins, and sulfa drugs cross reactors.  MEDICATIONS:  Current Outpatient Medications  Medication Sig Dispense Refill   acetaminophen (TYLENOL) 500 MG tablet Take 500 mg by mouth every 6 (six) hours as needed. (Patient not taking: Reported on 01/31/2023)     albuterol (PROVENTIL) (  2.5 MG/3ML) 0.083% nebulizer solution Take 3 mLs (2.5 mg total) by nebulization every 6 (six) hours as needed for wheezing or shortness of breath. 75 mL 12   albuterol (VENTOLIN HFA) 108 (90 Base) MCG/ACT inhaler Inhale 2 puffs into the lungs every 6 (six) hours as needed for wheezing or shortness of breath. (Patient not taking: Reported on 02/13/2023) 8 g 2   amLODipine (NORVASC) 10 MG tablet TAKE 1 TABLET BY MOUTH ONCE DAILY . APPOINTMENT REQUIRED FOR FUTURE REFILLS 90 tablet 1   aspirin EC 81 MG tablet Take 1 tablet (81 mg total) by mouth daily. 90 tablet 2   atorvastatin (LIPITOR) 40 MG tablet Take 1  tablet (40 mg total) by mouth daily. 90 tablet 1   Cholecalciferol (VITAMIN D3) 250 MCG (10000 UT) capsule Take 10,000 mcg by mouth daily.     diphenhydrAMINE HCl (BENADRYL ALLERGY PO) Take 25 mg by mouth at bedtime as needed (for sleep). (Patient not taking: Reported on 01/31/2023)     escitalopram (LEXAPRO) 5 MG tablet Take 1 tablet (5 mg total) by mouth daily for 14 days, THEN 1 tablet (5 mg total) every other day for 14 days, THEN 0.5 tablets (2.5 mg total) every other day for 14 days. 24 tablet 0   folic acid (FOLVITE) 1 MG tablet Take 1 tablet (1 mg total) by mouth daily. 90 tablet 0   hydrocortisone cream 0.5 % Apply 1 Application topically 2 (two) times daily. As needed for itching. (Patient not taking: Reported on 02/13/2023) 30 g 0   levothyroxine (SYNTHROID) 150 MCG tablet Take 1 tablet (150 mcg total) by mouth daily before breakfast. 90 tablet 0   lidocaine-prilocaine (EMLA) cream Apply 1 Application topically as needed. (Patient not taking: Reported on 01/31/2023) 30 g 2   losartan (COZAAR) 100 MG tablet Take 1 tablet (100 mg total) by mouth daily. 90 tablet 1   metoprolol succinate (TOPROL-XL) 50 MG 24 hr tablet TAKE 1 TABLET BY MOUTH ONCE DAILY WITH MEALS OR  IMMEDIATLEY  AFTER  A  MEAL 90 tablet 1   mirtazapine (REMERON) 15 MG tablet Take 1 tablet (15 mg total) by mouth at bedtime. (Patient not taking: Reported on 02/13/2023) 30 tablet 1   Multiple Vitamins-Minerals (MULTIVITAMIN WITH MINERALS) tablet Take 1 tablet by mouth daily.     nitroGLYCERIN (NITROSTAT) 0.4 MG SL tablet Place 1 tablet (0.4 mg total) under the tongue every 5 (five) minutes as needed. For chest pain (Patient not taking: Reported on 10/26/2021) 25 tablet 6   prochlorperazine (COMPAZINE) 10 MG tablet TAKE 1 TABLET BY MOUTH EVERY 6 HOURS AS NEEDED FOR NAUSEA FOR VOMITING (Patient not taking: Reported on 02/13/2023) 30 tablet 0   umeclidinium-vilanterol (ANORO ELLIPTA) 62.5-25 MCG/ACT AEPB Inhale 1 puff into the lungs daily.  60 each 1   Vitamin A 2400 MCG (8000 UT) TABS Take 2,400 mg by mouth daily. (Patient not taking: Reported on 01/31/2023)     No current facility-administered medications for this visit.    SURGICAL HISTORY:  Past Surgical History:  Procedure Laterality Date   CARDIAC CATHETERIZATION  01/02/2007   IT REVEALS MILD INFERIOR WALL HYPOKINESIS. THE EJECTION FRACTION IS AROUND 50%   COLONOSCOPY     CORONARY ARTERY BYPASS GRAFT  11/06/2006   LIMA to LAD, SVG to DX, SVG to LCX & SVG to OM 1 & 2, and SVG to PD   CORONARY STENT PLACEMENT     Remote past stent to RCA   EYE SURGERY Right  cataracts removed   HEMORRHOID SURGERY  11/07/1987   IR IMAGING GUIDED PORT INSERTION  10/04/2022   UPPER GI ENDOSCOPY     VENTRICULOSTOMY  10/06/2011   Procedure: VENTRICULOSTOMY;  Surgeon: Carmela Hurt;  Location: [facility];  Service: Neurosurgery;  Laterality: Right;  Insertion of Ventriculostomy Catheter   VIDEO BRONCHOSCOPY WITH ENDOBRONCHIAL NAVIGATION Left 05/20/2021   Procedure: VIDEO BRONCHOSCOPY WITH ENDOBRONCHIAL NAVIGATION;  Surgeon: Josephine Igo, DO;  Location: [facility] OR;  Service: Pulmonary;  Laterality: Left;   VIDEO BRONCHOSCOPY WITH ENDOBRONCHIAL ULTRASOUND Bilateral 05/20/2021   Procedure: VIDEO BRONCHOSCOPY WITH ENDOBRONCHIAL ULTRASOUND;  Surgeon: Josephine Igo, DO;  Location: [facility] OR;  Service: Pulmonary;  Laterality: Bilateral;   WISDOM TOOTH EXTRACTION      REVIEW OF SYSTEMS:  A comprehensive review of systems was negative except for: Constitutional: positive for fatigue Respiratory: positive for dyspnea on exertion   PHYSICAL EXAMINATION: General appearance: alert, cooperative, fatigued, and no distress Head: Normocephalic, without obvious abnormality, atraumatic Neck: no adenopathy, no JVD, supple, symmetrical, trachea midline, and thyroid not enlarged, symmetric, no tenderness/mass/nodules Lymph nodes: Cervical, supraclavicular, and axillary nodes normal. Resp: clear to  auscultation bilaterally Back: symmetric, no curvature. ROM normal. No CVA tenderness. Cardio: regular rate and rhythm, S1, S2 normal, no murmur, click, rub or gallop GI: soft, non-tender; bowel sounds normal; no masses,  no organomegaly Extremities: extremities normal, atraumatic, no cyanosis or edema  ECOG PERFORMANCE STATUS: 1 - Symptomatic but completely ambulatory  Blood pressure (!) 141/69, pulse 92, temperature 97.8 F (36.6 C), temperature source Oral, resp. rate 14, weight 140 lb 11.2 oz (63.8 kg), SpO2 90 %.  LABORATORY DATA: Lab Results  Component Value Date   WBC 5.5 02/20/2023   HGB 11.0 (L) 02/20/2023   HCT 33.2 (L) 02/20/2023   MCV 106.1 (H) 02/20/2023   PLT 376 02/20/2023      Chemistry      Component Value Date/Time   NA 140 01/31/2023 0949   NA 141 04/12/2021 1356   K 3.5 01/31/2023 0949   CL 102 01/31/2023 0949   CO2 33 (H) 01/31/2023 0949   BUN 10 01/31/2023 0949   BUN 13 04/12/2021 1356   CREATININE 1.04 (H) 01/31/2023 0949   CREATININE 0.99 08/29/2016 1130      Component Value Date/Time   CALCIUM 9.3 01/31/2023 0949   ALKPHOS 66 01/31/2023 0949   AST 22 01/31/2023 0949   ALT 11 01/31/2023 0949   BILITOT 0.4 01/31/2023 0949       RADIOGRAPHIC STUDIES: No results found.  ASSESSMENT AND PLAN: This is a very pleasant 72 years old white female diagnosed with a stage IV (T2 a, N2, M1 B) non-small cell lung cancer, adenocarcinoma presented with left upper lobe lung mass in addition to left hilar and precarinal metastatic adenopathy as well as small left lower lobe pulmonary nodule and solitary left frontal brain metastasis diagnosed in July 2022.  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 The patient is status post SRS to the solitary brain metastasis. She underwent a course of concurrent chemoradiation to the locally advanced disease in the chest with carboplatin for AUC of 2 and paclitaxel 45 Mg/M2  status post 7 cycles.  The patient has been tolerating her treatment well with no concerning adverse effects. Her scan showed mild improvement of her disease with no concerning findings for progression. Technically the patient has a stage IV lung cancer with brain metastasis  The patient is  currently undergoing systemic chemotherapy with carboplatin for AUC of 5, Alimta 500 Mg/M2 and Keytruda 200 Mg IV every 3 weeks status post 24 cycles.  Starting from cycle #5 the patient will be on maintenance treatment with Alimta and Keytruda every 3 weeks.   The patient continues to tolerate this treatment fairly well with no concerning adverse effects.  I recommended for her to proceed with cycle #25 today as planned. I will see her back for follow-up visit in 3 weeks for evaluation with repeat CT scan of the chest, abdomen and pelvis for restaging of her disease. The patient was advised to call immediately if she has any other concerning symptoms in the interval. The patient voices understanding of current disease status and treatment options and is in agreement with the current care plan. The total time spent in the appointment was 20 minutes.  All questions were answered. The patient knows to call the clinic with any problems, questions or concerns. We can certainly see the patient much sooner if necessary.  Disclaimer: This note was dictated with voice recognition software. Similar sounding words can inadvertently be transcribed and may not be corrected upon review.

## 2023-02-21 ENCOUNTER — Ambulatory Visit: Payer: Medicare HMO

## 2023-02-21 ENCOUNTER — Other Ambulatory Visit: Payer: Self-pay | Admitting: Family Medicine

## 2023-02-21 ENCOUNTER — Other Ambulatory Visit: Payer: Medicare HMO

## 2023-02-22 ENCOUNTER — Telehealth: Payer: Self-pay | Admitting: Internal Medicine

## 2023-02-22 NOTE — Telephone Encounter (Signed)
Called patient regarding May appointments, left a voicemail. ?

## 2023-02-23 ENCOUNTER — Other Ambulatory Visit: Payer: Self-pay | Admitting: Family Medicine

## 2023-03-05 ENCOUNTER — Encounter (HOSPITAL_COMMUNITY): Payer: Self-pay

## 2023-03-05 ENCOUNTER — Ambulatory Visit (HOSPITAL_COMMUNITY)
Admission: RE | Admit: 2023-03-05 | Discharge: 2023-03-05 | Disposition: A | Discharge status: Home / Self Care | Payer: Medicare HMO | Source: Ambulatory Visit | Attending: Physician Assistant | Admitting: Physician Assistant

## 2023-03-05 DIAGNOSIS — C3490 Malignant neoplasm of unspecified part of unspecified bronchus or lung: Secondary | ICD-10-CM | POA: Diagnosis not present

## 2023-03-05 DIAGNOSIS — N281 Cyst of kidney, acquired: Secondary | ICD-10-CM | POA: Diagnosis not present

## 2023-03-05 DIAGNOSIS — J90 Pleural effusion, not elsewhere classified: Secondary | ICD-10-CM | POA: Diagnosis not present

## 2023-03-05 DIAGNOSIS — K3189 Other diseases of stomach and duodenum: Secondary | ICD-10-CM | POA: Diagnosis not present

## 2023-03-05 DIAGNOSIS — C3412 Malignant neoplasm of upper lobe, left bronchus or lung: Secondary | ICD-10-CM | POA: Insufficient documentation

## 2023-03-05 MED ORDER — IOHEXOL 300 MG/ML  SOLN
100.0000 mL | Freq: Once | INTRAMUSCULAR | Status: AC | PRN
  Administered 2023-03-05: 100 mL via INTRAVENOUS

## 2023-03-05 MED ORDER — SODIUM CHLORIDE (PF) 0.9 % IJ SOLN
INTRAMUSCULAR | Status: AC
  Filled 2023-03-05: qty 50

## 2023-03-06 ENCOUNTER — Ambulatory Visit (HOSPITAL_COMMUNITY)
Admission: RE | Admit: 2023-03-06 | Discharge: 2023-03-06 | Disposition: A | Discharge status: Home / Self Care | Payer: Medicare HMO | Source: Ambulatory Visit | Attending: Family Medicine | Admitting: Family Medicine

## 2023-03-06 DIAGNOSIS — F17200 Nicotine dependence, unspecified, uncomplicated: Secondary | ICD-10-CM | POA: Diagnosis not present

## 2023-03-06 DIAGNOSIS — K219 Gastro-esophageal reflux disease without esophagitis: Secondary | ICD-10-CM | POA: Insufficient documentation

## 2023-03-06 DIAGNOSIS — I252 Old myocardial infarction: Secondary | ICD-10-CM | POA: Diagnosis not present

## 2023-03-06 DIAGNOSIS — J449 Chronic obstructive pulmonary disease, unspecified: Secondary | ICD-10-CM | POA: Insufficient documentation

## 2023-03-06 DIAGNOSIS — R0609 Other forms of dyspnea: Secondary | ICD-10-CM | POA: Diagnosis not present

## 2023-03-06 DIAGNOSIS — E785 Hyperlipidemia, unspecified: Secondary | ICD-10-CM | POA: Diagnosis not present

## 2023-03-06 DIAGNOSIS — I10 Essential (primary) hypertension: Secondary | ICD-10-CM | POA: Insufficient documentation

## 2023-03-06 DIAGNOSIS — R0600 Dyspnea, unspecified: Secondary | ICD-10-CM | POA: Insufficient documentation

## 2023-03-06 DIAGNOSIS — I2510 Atherosclerotic heart disease of native coronary artery without angina pectoris: Secondary | ICD-10-CM | POA: Insufficient documentation

## 2023-03-06 LAB — ECHOCARDIOGRAM COMPLETE
Area-P 1/2: 4.36 cm2
Calc EF: 48.2 %
S' Lateral: 2.9 cm
Single Plane A2C EF: 48.5 %
Single Plane A4C EF: 49.5 %

## 2023-03-06 NOTE — Progress Notes (Signed)
Echocardiogram 2D Echocardiogram has been performed.  Augustine Radar 03/06/2023, 3:38 PM

## 2023-03-07 ENCOUNTER — Ambulatory Visit: Payer: Medicare HMO

## 2023-03-07 ENCOUNTER — Other Ambulatory Visit: Payer: Medicare HMO

## 2023-03-07 ENCOUNTER — Inpatient Hospital Stay: Payer: Medicare HMO | Admitting: Physician Assistant

## 2023-03-07 ENCOUNTER — Telehealth: Payer: Self-pay | Admitting: Family Medicine

## 2023-03-07 ENCOUNTER — Ambulatory Visit: Payer: Medicare HMO | Admitting: Internal Medicine

## 2023-03-07 NOTE — Telephone Encounter (Signed)
HIPAA compliant callback message left.   Please advise when she calls.  ECHO: Normal Ejection fraction with some ventricular regional wall dysmotility. Overall, ECHO looks reassuring. F/U as planned.

## 2023-03-08 ENCOUNTER — Telehealth: Payer: Self-pay

## 2023-03-08 NOTE — Telephone Encounter (Signed)
This nurse reached out to patient to see how she is feeling and if she is having any difficulty with breathing.  Left message for patient.  Advised to give [place] a call in the clinic if she has any concerns or questions.

## 2023-03-13 NOTE — Telephone Encounter (Signed)
Patient returns call to nurse line requesting ECHO results.   ECHO results given to patient.  Patient appreciative.

## 2023-03-14 ENCOUNTER — Inpatient Hospital Stay: Payer: Medicare HMO

## 2023-03-14 ENCOUNTER — Inpatient Hospital Stay (HOSPITAL_BASED_OUTPATIENT_CLINIC_OR_DEPARTMENT_OTHER): Payer: Medicare HMO | Admitting: Internal Medicine

## 2023-03-14 ENCOUNTER — Inpatient Hospital Stay: Payer: Medicare HMO | Attending: Internal Medicine

## 2023-03-14 ENCOUNTER — Other Ambulatory Visit: Payer: Self-pay

## 2023-03-14 VITALS — BP 130/82 | HR 90 | Temp 97.5°F | Resp 18 | Wt 141.1 lb

## 2023-03-14 VITALS — BP 122/67 | HR 83 | Resp 17

## 2023-03-14 DIAGNOSIS — Z8719 Personal history of other diseases of the digestive system: Secondary | ICD-10-CM | POA: Diagnosis not present

## 2023-03-14 DIAGNOSIS — C3412 Malignant neoplasm of upper lobe, left bronchus or lung: Secondary | ICD-10-CM | POA: Insufficient documentation

## 2023-03-14 DIAGNOSIS — Z79620 Long term (current) use of immunosuppressive biologic: Secondary | ICD-10-CM | POA: Diagnosis not present

## 2023-03-14 DIAGNOSIS — C7931 Secondary malignant neoplasm of brain: Secondary | ICD-10-CM | POA: Diagnosis not present

## 2023-03-14 DIAGNOSIS — E785 Hyperlipidemia, unspecified: Secondary | ICD-10-CM | POA: Diagnosis not present

## 2023-03-14 DIAGNOSIS — E039 Hypothyroidism, unspecified: Secondary | ICD-10-CM | POA: Insufficient documentation

## 2023-03-14 DIAGNOSIS — M8589 Other specified disorders of bone density and structure, multiple sites: Secondary | ICD-10-CM | POA: Insufficient documentation

## 2023-03-14 DIAGNOSIS — R0609 Other forms of dyspnea: Secondary | ICD-10-CM | POA: Insufficient documentation

## 2023-03-14 DIAGNOSIS — Z5111 Encounter for antineoplastic chemotherapy: Secondary | ICD-10-CM | POA: Insufficient documentation

## 2023-03-14 DIAGNOSIS — Z880 Allergy status to penicillin: Secondary | ICD-10-CM | POA: Insufficient documentation

## 2023-03-14 DIAGNOSIS — J449 Chronic obstructive pulmonary disease, unspecified: Secondary | ICD-10-CM | POA: Diagnosis not present

## 2023-03-14 DIAGNOSIS — F17210 Nicotine dependence, cigarettes, uncomplicated: Secondary | ICD-10-CM | POA: Insufficient documentation

## 2023-03-14 DIAGNOSIS — Z923 Personal history of irradiation: Secondary | ICD-10-CM | POA: Diagnosis not present

## 2023-03-14 DIAGNOSIS — Z87440 Personal history of urinary (tract) infections: Secondary | ICD-10-CM | POA: Diagnosis not present

## 2023-03-14 DIAGNOSIS — N281 Cyst of kidney, acquired: Secondary | ICD-10-CM | POA: Diagnosis not present

## 2023-03-14 DIAGNOSIS — Z95828 Presence of other vascular implants and grafts: Secondary | ICD-10-CM

## 2023-03-14 DIAGNOSIS — Z9221 Personal history of antineoplastic chemotherapy: Secondary | ICD-10-CM | POA: Insufficient documentation

## 2023-03-14 DIAGNOSIS — Z5112 Encounter for antineoplastic immunotherapy: Secondary | ICD-10-CM | POA: Insufficient documentation

## 2023-03-14 DIAGNOSIS — R5383 Other fatigue: Secondary | ICD-10-CM | POA: Insufficient documentation

## 2023-03-14 DIAGNOSIS — I252 Old myocardial infarction: Secondary | ICD-10-CM | POA: Insufficient documentation

## 2023-03-14 DIAGNOSIS — K429 Umbilical hernia without obstruction or gangrene: Secondary | ICD-10-CM | POA: Diagnosis not present

## 2023-03-14 DIAGNOSIS — C3490 Malignant neoplasm of unspecified part of unspecified bronchus or lung: Secondary | ICD-10-CM

## 2023-03-14 DIAGNOSIS — Z79899 Other long term (current) drug therapy: Secondary | ICD-10-CM | POA: Insufficient documentation

## 2023-03-14 DIAGNOSIS — R601 Generalized edema: Secondary | ICD-10-CM | POA: Insufficient documentation

## 2023-03-14 DIAGNOSIS — Z888 Allergy status to other drugs, medicaments and biological substances status: Secondary | ICD-10-CM | POA: Insufficient documentation

## 2023-03-14 DIAGNOSIS — J90 Pleural effusion, not elsewhere classified: Secondary | ICD-10-CM | POA: Insufficient documentation

## 2023-03-14 DIAGNOSIS — Z79631 Long term (current) use of antimetabolite agent: Secondary | ICD-10-CM | POA: Diagnosis not present

## 2023-03-14 DIAGNOSIS — Z79890 Hormone replacement therapy (postmenopausal): Secondary | ICD-10-CM | POA: Insufficient documentation

## 2023-03-14 DIAGNOSIS — Z882 Allergy status to sulfonamides status: Secondary | ICD-10-CM | POA: Insufficient documentation

## 2023-03-14 LAB — CBC WITH DIFFERENTIAL (CANCER CENTER ONLY)
Abs Immature Granulocytes: 0.01 10*3/uL (ref 0.00–0.07)
Basophils Absolute: 0.1 10*3/uL (ref 0.0–0.1)
Basophils Relative: 1 %
Eosinophils Absolute: 0.2 10*3/uL (ref 0.0–0.5)
Eosinophils Relative: 4 %
HCT: 36.7 % (ref 36.0–46.0)
Hemoglobin: 11.8 g/dL — ABNORMAL LOW (ref 12.0–15.0)
Immature Granulocytes: 0 %
Lymphocytes Relative: 18 %
Lymphs Abs: 1.1 10*3/uL (ref 0.7–4.0)
MCH: 34.5 pg — ABNORMAL HIGH (ref 26.0–34.0)
MCHC: 32.2 g/dL (ref 30.0–36.0)
MCV: 107.3 fL — ABNORMAL HIGH (ref 80.0–100.0)
Monocytes Absolute: 0.7 10*3/uL (ref 0.1–1.0)
Monocytes Relative: 11 %
Neutro Abs: 4 10*3/uL (ref 1.7–7.7)
Neutrophils Relative %: 66 %
Platelet Count: 435 10*3/uL — ABNORMAL HIGH (ref 150–400)
RBC: 3.42 MIL/uL — ABNORMAL LOW (ref 3.87–5.11)
RDW: 14.5 % (ref 11.5–15.5)
WBC Count: 6.1 10*3/uL (ref 4.0–10.5)
nRBC: 0 % (ref 0.0–0.2)

## 2023-03-14 LAB — CMP (CANCER CENTER ONLY)
ALT: 9 U/L (ref 0–44)
AST: 20 U/L (ref 15–41)
Albumin: 3.5 g/dL (ref 3.5–5.0)
Alkaline Phosphatase: 81 U/L (ref 38–126)
Anion gap: 3 — ABNORMAL LOW (ref 5–15)
BUN: 11 mg/dL (ref 8–23)
CO2: 33 mmol/L — ABNORMAL HIGH (ref 22–32)
Calcium: 8.9 mg/dL (ref 8.9–10.3)
Chloride: 104 mmol/L (ref 98–111)
Creatinine: 1.01 mg/dL — ABNORMAL HIGH (ref 0.44–1.00)
GFR, Estimated: 58 mL/min — ABNORMAL LOW (ref >60)
Glucose, Bld: 93 mg/dL (ref 70–99)
Potassium: 4.1 mmol/L (ref 3.5–5.1)
Sodium: 140 mmol/L (ref 135–145)
Total Bilirubin: 0.3 mg/dL (ref 0.3–1.2)
Total Protein: 7 g/dL (ref 6.5–8.1)

## 2023-03-14 MED ORDER — SODIUM CHLORIDE 0.9% FLUSH
10.0000 mL | Freq: Once | INTRAVENOUS | Status: AC
  Administered 2023-03-14: 10 mL

## 2023-03-14 MED ORDER — SODIUM CHLORIDE 0.9 % IV SOLN
200.0000 mg | Freq: Once | INTRAVENOUS | Status: AC
  Administered 2023-03-14: 200 mg via INTRAVENOUS
  Filled 2023-03-14: qty 200

## 2023-03-14 MED ORDER — SODIUM CHLORIDE 0.9 % IV SOLN: Freq: Once | INTRAVENOUS | Status: AC

## 2023-03-14 MED ORDER — SODIUM CHLORIDE 0.9% FLUSH
10.0000 mL | INTRAVENOUS | Status: DC | PRN
  Administered 2023-03-14 (×2): 10 mL

## 2023-03-14 MED ORDER — PROCHLORPERAZINE MALEATE 10 MG PO TABS
10.0000 mg | ORAL_TABLET | Freq: Once | ORAL | Status: AC
  Administered 2023-03-14: 10 mg via ORAL
  Filled 2023-03-14: qty 1

## 2023-03-14 MED ORDER — SODIUM CHLORIDE 0.9 % IV SOLN
500.0000 mg/m2 | Freq: Once | INTRAVENOUS | Status: AC
  Administered 2023-03-14: 900 mg via INTRAVENOUS
  Filled 2023-03-14: qty 20

## 2023-03-14 MED ORDER — HEPARIN SOD (PORK) LOCK FLUSH 100 UNIT/ML IV SOLN
500.0000 [IU] | Freq: Once | INTRAVENOUS | Status: AC | PRN
  Administered 2023-03-14: 500 [IU]

## 2023-03-14 NOTE — Progress Notes (Signed)
[facility] [phone]   [phone]  OFFICE PROGRESS NOTE  [name], MD [address] [place] [address]  DIAGNOSIS: Stage IV (T2 a, N2, M1b) non-small cell lung cancer, adenocarcinoma presented with left upper lobe lung mass in addition to left hilar and precarinal metastatic adenopathy and small left lower lobe pulmonary nodule in addition to solitary left frontal brain metastasis diagnosed in July 2022.  Molecular studies by [employer] showed  Positive KRAS G12C mutation PD-L1 expression was less than 1%   PRIOR THERAPY:  1) Status post SRS to the solitary brain metastasis. 20 Treatment for the locally advanced disease in the chest with carboplatin for AUC of 2 and paclitaxel 45 Mg/M2.  Status post 6 cycles.  Last dose was given 07/25/2021.  CURRENT THERAPY: Systemic chemotherapy with carboplatin for AUC of 5, Alimta 500 Mg/M2 and Keytruda 200 Mg IV every 3 weeks.  First dose August 31, 2021.  Status post 25 cycles.  Starting from cycle #5 the patient will be on maintenance treatment with Alimta and Keytruda every 3 weeks.  INTERVAL HISTORY: Jill [name] 72 y.o. female returns to the clinic today for follow-up visit.  The patient is feeling fine today with no concerning complaints except for fatigue.  She denied having any chest pain but has shortness of breath with exertion with no cough or hemoptysis.  She has no nausea, vomiting, diarrhea or constipation.  She has no headache or visual changes.  She denied having any recent weight loss or night sweats.  She has been tolerating her treatment fairly well.  She is here for evaluation and repeat CT scan of the chest, abdomen and pelvis for restaging of her disease.  MEDICAL HISTORY: Past Medical History:  Diagnosis Date   Anxiety    ON PAXIL, XANAX   AVM (arteriovenous malformation) brain    s/p stent/coil   Blood transfusion    x 2   Cancer (HCC)    Left lung    COPD (chronic obstructive pulmonary disease) (HCC)    no inhaler, no oxygen   Coronary artery disease    Prior inferior MI with stent to RCA, s/p CABG in 2008   Depression    Dizziness    Dyspnea    with exertion, no oxygen   Fatigue    Foot injury 06/01/2017   right - RESOLVED, no longer an issue per patient 05/18/21   GERD (gastroesophageal reflux disease)    Headache(784.0)    UNRUPTURED CEREBRAL ANEURYSM   Hyperlipidemia    Hypertension    Hypothyroidism    Infected cyst of skin 09/18/2013   Left knee pain 06/05/2012   Lung mass    Left lung   Myocardial infarction (HCC)    Normal nuclear stress test Ju;y 2012   No ischemia. EF 70%; fixed defect involving septum, inferoseptal and inferior wall   Pain, dental 02/06/2017   Poor dental hygiene    Retroperitoneal bleeding    Following cardiac cath   Tobacco abuse    Urine discoloration 09/18/2013   Urine incontinence 07/06/2020   UTI (lower urinary tract infection) 10/16/2013    ALLERGIES:  is allergic to varenicline, ace inhibitors, prednisone, betalin 12 [vitamin b12], penicillins, and sulfa drugs cross reactors.  MEDICATIONS:  Current Outpatient Medications  Medication Sig Dispense Refill   acetaminophen (TYLENOL) 500 MG tablet Take 500 mg by mouth every 6 (six) hours as needed. (Patient not taking: Reported on  01/31/2023)     albuterol (PROVENTIL) (2.5 MG/3ML) 0.083% nebulizer solution Take 3 mLs (2.5 mg total) by nebulization every 6 (six) hours as needed for wheezing or shortness of breath. 75 mL 12   albuterol (VENTOLIN HFA) 108 (90 Base) MCG/ACT inhaler Inhale 2 puffs into the lungs every 6 (six) hours as needed for wheezing or shortness of breath. (Patient not taking: Reported on 02/13/2023) 8 g 2   amLODipine (NORVASC) 10 MG tablet TAKE 1 TABLET BY MOUTH ONCE DAILY . APPOINTMENT REQUIRED FOR FUTURE REFILLS 90 tablet 1   aspirin EC 81 MG tablet Take 1 tablet (81 mg total) by mouth daily. 90 tablet 2   atorvastatin  (LIPITOR) 40 MG tablet Take 1 tablet (40 mg total) by mouth daily. 90 tablet 1   Cholecalciferol (VITAMIN D3) 250 MCG (10000 UT) capsule Take 10,000 mcg by mouth daily.     diphenhydrAMINE HCl (BENADRYL ALLERGY PO) Take 25 mg by mouth at bedtime as needed (for sleep). (Patient not taking: Reported on 01/31/2023)     folic acid (FOLVITE) 1 MG tablet Take 1 tablet (1 mg total) by mouth daily. 90 tablet 0   hydrocortisone cream 0.5 % Apply 1 Application topically 2 (two) times daily. As needed for itching. (Patient not taking: Reported on 02/13/2023) 30 g 0   levothyroxine (SYNTHROID) 150 MCG tablet Take 1 tablet (150 mcg total) by mouth daily before breakfast. 90 tablet 0   lidocaine-prilocaine (EMLA) cream Apply 1 Application topically as needed. (Patient not taking: Reported on 01/31/2023) 30 g 2   losartan (COZAAR) 100 MG tablet Take 1 tablet (100 mg total) by mouth daily. 90 tablet 1   metoprolol succinate (TOPROL-XL) 50 MG 24 hr tablet TAKE 1 TABLET BY MOUTH ONCE DAILY WITH MEALS OR  IMMEDIATLEY  AFTER  A  MEAL 90 tablet 1   mirtazapine (REMERON) 15 MG tablet TAKE 1 TABLET BY MOUTH EVERYDAY AT BEDTIME 90 tablet 1   Multiple Vitamins-Minerals (MULTIVITAMIN WITH MINERALS) tablet Take 1 tablet by mouth daily.     nitroGLYCERIN (NITROSTAT) 0.4 MG SL tablet Place 1 tablet (0.4 mg total) under the tongue every 5 (five) minutes as needed. For chest pain (Patient not taking: Reported on 10/26/2021) 25 tablet 6   prochlorperazine (COMPAZINE) 10 MG tablet TAKE 1 TABLET BY MOUTH EVERY 6 HOURS AS NEEDED FOR NAUSEA FOR VOMITING (Patient not taking: Reported on 02/13/2023) 30 tablet 0   umeclidinium-vilanterol (ANORO ELLIPTA) 62.5-25 MCG/ACT AEPB Inhale 1 puff into the lungs daily. 60 each 1   Vitamin A 2400 MCG (8000 UT) TABS Take 2,400 mg by mouth daily. (Patient not taking: Reported on 01/31/2023)     No current facility-administered medications for this visit.    SURGICAL HISTORY:  Past Surgical History:   Procedure Laterality Date   CARDIAC CATHETERIZATION  01/02/2007   IT REVEALS MILD INFERIOR WALL HYPOKINESIS. THE EJECTION FRACTION IS AROUND 50%   COLONOSCOPY     CORONARY ARTERY BYPASS GRAFT  11/06/2006   LIMA to LAD, SVG to DX, SVG to LCX & SVG to OM 1 & 2, and SVG to PD   CORONARY STENT PLACEMENT     Remote past stent to RCA   EYE SURGERY Right    cataracts removed   HEMORRHOID SURGERY  11/07/1987   IR IMAGING GUIDED PORT INSERTION  10/04/2022   UPPER GI ENDOSCOPY     VENTRICULOSTOMY  10/06/2011   Procedure: VENTRICULOSTOMY;  Surgeon: Carmela Hurt;  Location: [facility];  Service: Neurosurgery;  Laterality: Right;  Insertion of Ventriculostomy Catheter   VIDEO BRONCHOSCOPY WITH ENDOBRONCHIAL NAVIGATION Left 05/20/2021   Procedure: VIDEO BRONCHOSCOPY WITH ENDOBRONCHIAL NAVIGATION;  Surgeon: Josephine Igo, DO;  Location: [facility] OR;  Service: Pulmonary;  Laterality: Left;   VIDEO BRONCHOSCOPY WITH ENDOBRONCHIAL ULTRASOUND Bilateral 05/20/2021   Procedure: VIDEO BRONCHOSCOPY WITH ENDOBRONCHIAL ULTRASOUND;  Surgeon: Josephine Igo, DO;  Location: [facility] OR;  Service: Pulmonary;  Laterality: Bilateral;   WISDOM TOOTH EXTRACTION      REVIEW OF SYSTEMS:  Constitutional: positive for fatigue Eyes: negative Ears, nose, mouth, throat, and face: negative Respiratory: positive for dyspnea on exertion Cardiovascular: negative Gastrointestinal: negative Genitourinary:negative Integument/breast: negative Hematologic/lymphatic: negative Musculoskeletal:negative Neurological: negative Behavioral/Psych: negative Endocrine: negative Allergic/Immunologic: negative   PHYSICAL EXAMINATION: General appearance: alert, cooperative, fatigued, and no distress Head: Normocephalic, without obvious abnormality, atraumatic Neck: no adenopathy, no JVD, supple, symmetrical, trachea midline, and thyroid not enlarged, symmetric, no tenderness/mass/nodules Lymph nodes: Cervical, supraclavicular, and axillary  nodes normal. Resp: clear to auscultation bilaterally Back: symmetric, no curvature. ROM normal. No CVA tenderness. Cardio: regular rate and rhythm, S1, S2 normal, no murmur, click, rub or gallop GI: soft, non-tender; bowel sounds normal; no masses,  no organomegaly Extremities: extremities normal, atraumatic, no cyanosis or edema Neurologic: Alert and oriented X 3, normal strength and tone. Normal symmetric reflexes. Normal coordination and gait  ECOG PERFORMANCE STATUS: 1 - Symptomatic but completely ambulatory  Blood pressure 130/82, pulse 90, temperature (!) 97.5 F (36.4 C), temperature source Oral, resp. rate 18, weight 141 lb 1.6 oz (64 kg), SpO2 92 %.  LABORATORY DATA: Lab Results  Component Value Date   WBC 5.5 02/20/2023   HGB 11.0 (L) 02/20/2023   HCT 33.2 (L) 02/20/2023   MCV 106.1 (H) 02/20/2023   PLT 376 02/20/2023      Chemistry      Component Value Date/Time   NA 141 02/20/2023 0914   NA 141 04/12/2021 1356   K 3.7 02/20/2023 0914   CL 105 02/20/2023 0914   CO2 30 02/20/2023 0914   BUN 13 02/20/2023 0914   BUN 13 04/12/2021 1356   CREATININE 1.09 (H) 02/20/2023 0914   CREATININE 0.99 08/29/2016 1130      Component Value Date/Time   CALCIUM 9.1 02/20/2023 0914   ALKPHOS 67 02/20/2023 0914   AST 22 02/20/2023 0914   ALT 11 02/20/2023 0914   BILITOT 0.3 02/20/2023 0914       RADIOGRAPHIC STUDIES: CT Chest W Contrast  Result Date: 03/07/2023 CLINICAL DATA:  Non-small-cell lung cancer. Left upper lobe adenocarcinoma with hilar and precarinal nodes. * Tracking Code: BO * EXAM: CT CHEST, ABDOMEN, AND PELVIS WITH CONTRAST TECHNIQUE: Multidetector CT imaging of the chest, abdomen and pelvis was performed following the standard protocol during bolus administration of intravenous contrast. RADIATION DOSE REDUCTION: This exam was performed according to the departmental dose-optimization program which includes automated exposure control, adjustment of the mA and/or  kV according to patient size and/or use of iterative reconstruction technique. CONTRAST:  OMNIPAQUE IOHEXOL 300 MG/ML  SOLN COMPARISON:  CT scan 10/27/2022 FINDINGS: CT CHEST FINDINGS Cardiovascular: Status post median sternotomy. Heart is nonenlarged. Trace pericardial fluid. Coronary artery calcifications are seen. There are some calcifications along the aortic valve. Bovine type aortic arch, normal variant. Scattered vascular calcifications along the thoracic aorta and branch vessels. Right upper chest port. Mediastinum/Nodes: Slight wall thickening along the lower esophagus, unchanged from previous. There is some mild edema of the mediastinal fat. Small  thyroid gland. No specific abnormal lymph node enlargement identified in the axillary region, hilum or mediastinum. Lungs/Pleura: Diffuse centrilobular emphysematous lung changes once again identified. Scattered reticular changes. Right lung is without consolidation, pneumothorax or effusion. Subtle small ground-glass area focally in the middle lobe on series 4, image 105 is stable. There is new moderate left pleural effusion. Compressive atelectasis is increased in the area of the lingula dependently. The ill-defined spiculated masslike area in the inferolateral left upper lobe extending out to the pleura laterally is again seen. Previously this area was measured at 2.8 x 2.3 cm on the previous exam and today when measured in the same fashion on series 4, image 65 would be estimated at 2.4 by 2.0 cm. Slightly smaller. No new dominant left-sided lung nodule. Musculoskeletal: Scattered degenerative changes of the spine. Osteopenia CT ABDOMEN PELVIS FINDINGS Hepatobiliary: Patent portal vein. Gallbladder is nondilated. There are some tiny low-attenuation lesion seen in the liver, unchanged from previous. These are too small to completely characterize although likely small cystic lesions and no specific imaging follow-up. There is also focal fat deposition  seen in the liver adjacent to the falciform ligament. The cystic like areas seen in the porta hepatis measuring 2.3 cm on the prior is unchanged today when adjusting for technique. Pancreas: Unremarkable. No pancreatic ductal dilatation or surrounding inflammatory changes. Spleen: Normal in size without focal abnormality.  Small splenule. Adrenals/Urinary Tract: Adrenal glands are preserved. Small bilateral Bosniak 1 renal cysts are seen. Largest is exophytic from the anterior aspect of the left kidney measuring 2.8 cm in diameter with Hounsfield unit of 20. not significantly changed when adjusting for technique. No collecting system dilatation. The ureters have normal course and caliber extending down to the bladder. Preserved contours of the urinary bladder. Stomach/Bowel: Moderate colonic stool. Large bowel is nondilated. Slightly tortuous course to the colon. Stomach and small bowel are nondilated. Fundal posterior gastric diverticulum. Again mild gastric fold thickening. Normal appendix. Vascular/Lymphatic: Diffuse vascular calcifications along the aorta and branch vessels. Areas of stenosis seen particularly along the iliac vessels, renal arteries. Please correlate with any particular symptoms. Normal course and caliber of the IVC. No discrete abnormal lymph node enlargement present in the abdomen and pelvis. Reproductive: Uterus and bilateral adnexa are unremarkable. Probable Bartholin's gland cysts along the right side of the vagina. Other: Mild anasarca. Small fat containing umbilical hernia. No free air or free fluid. Musculoskeletal: Mild degenerative changes along the spine with some osteopenia. Mild sclerosis along the left femoral head, nonspecific. IMPRESSION: Slight decrease in size left upper lobe spiculated nodule. No developing other new mass lesion, lymph node enlargement. New moderate left pleural effusion. Stable renal cystic lesions including the borderline dense structure on the left. Simple  attention on continued follow-up for the patient's neoplasm. Moderate colonic stool. Stable complex cystic focus in the area of the porta hepatis of uncertain etiology and significance. Electronically Signed   By: Karen Kays M.D.   On: 03/07/2023 15:49   CT Abdomen Pelvis W Contrast  Result Date: 03/07/2023 CLINICAL DATA:  Non-small-cell lung cancer. Left upper lobe adenocarcinoma with hilar and precarinal nodes. * Tracking Code: BO * EXAM: CT CHEST, ABDOMEN, AND PELVIS WITH CONTRAST TECHNIQUE: Multidetector CT imaging of the chest, abdomen and pelvis was performed following the standard protocol during bolus administration of intravenous contrast. RADIATION DOSE REDUCTION: This exam was performed according to the departmental dose-optimization program which includes automated exposure control, adjustment of the mA and/or kV according to patient size and/or  use of iterative reconstruction technique. CONTRAST:  OMNIPAQUE IOHEXOL 300 MG/ML  SOLN COMPARISON:  CT scan 10/27/2022 FINDINGS: CT CHEST FINDINGS Cardiovascular: Status post median sternotomy. Heart is nonenlarged. Trace pericardial fluid. Coronary artery calcifications are seen. There are some calcifications along the aortic valve. Bovine type aortic arch, normal variant. Scattered vascular calcifications along the thoracic aorta and branch vessels. Right upper chest port. Mediastinum/Nodes: Slight wall thickening along the lower esophagus, unchanged from previous. There is some mild edema of the mediastinal fat. Small thyroid gland. No specific abnormal lymph node enlargement identified in the axillary region, hilum or mediastinum. Lungs/Pleura: Diffuse centrilobular emphysematous lung changes once again identified. Scattered reticular changes. Right lung is without consolidation, pneumothorax or effusion. Subtle small ground-glass area focally in the middle lobe on series 4, image 105 is stable. There is new moderate left pleural effusion.  Compressive atelectasis is increased in the area of the lingula dependently. The ill-defined spiculated masslike area in the inferolateral left upper lobe extending out to the pleura laterally is again seen. Previously this area was measured at 2.8 x 2.3 cm on the previous exam and today when measured in the same fashion on series 4, image 65 would be estimated at 2.4 by 2.0 cm. Slightly smaller. No new dominant left-sided lung nodule. Musculoskeletal: Scattered degenerative changes of the spine. Osteopenia CT ABDOMEN PELVIS FINDINGS Hepatobiliary: Patent portal vein. Gallbladder is nondilated. There are some tiny low-attenuation lesion seen in the liver, unchanged from previous. These are too small to completely characterize although likely small cystic lesions and no specific imaging follow-up. There is also focal fat deposition seen in the liver adjacent to the falciform ligament. The cystic like areas seen in the porta hepatis measuring 2.3 cm on the prior is unchanged today when adjusting for technique. Pancreas: Unremarkable. No pancreatic ductal dilatation or surrounding inflammatory changes. Spleen: Normal in size without focal abnormality.  Small splenule. Adrenals/Urinary Tract: Adrenal glands are preserved. Small bilateral Bosniak 1 renal cysts are seen. Largest is exophytic from the anterior aspect of the left kidney measuring 2.8 cm in diameter with Hounsfield unit of 20. not significantly changed when adjusting for technique. No collecting system dilatation. The ureters have normal course and caliber extending down to the bladder. Preserved contours of the urinary bladder. Stomach/Bowel: Moderate colonic stool. Large bowel is nondilated. Slightly tortuous course to the colon. Stomach and small bowel are nondilated. Fundal posterior gastric diverticulum. Again mild gastric fold thickening. Normal appendix. Vascular/Lymphatic: Diffuse vascular calcifications along the aorta and branch vessels. Areas of  stenosis seen particularly along the iliac vessels, renal arteries. Please correlate with any particular symptoms. Normal course and caliber of the IVC. No discrete abnormal lymph node enlargement present in the abdomen and pelvis. Reproductive: Uterus and bilateral adnexa are unremarkable. Probable Bartholin's gland cysts along the right side of the vagina. Other: Mild anasarca. Small fat containing umbilical hernia. No free air or free fluid. Musculoskeletal: Mild degenerative changes along the spine with some osteopenia. Mild sclerosis along the left femoral head, nonspecific. IMPRESSION: Slight decrease in size left upper lobe spiculated nodule. No developing other new mass lesion, lymph node enlargement. New moderate left pleural effusion. Stable renal cystic lesions including the borderline dense structure on the left. Simple attention on continued follow-up for the patient's neoplasm. Moderate colonic stool. Stable complex cystic focus in the area of the porta hepatis of uncertain etiology and significance. Electronically Signed   By: Karen Kays M.D.   On: 03/07/2023 15:49  ECHOCARDIOGRAM COMPLETE  Result Date: 03/06/2023    ECHOCARDIOGRAM REPORT   Patient Name:   Jill [name] Date of Exam: 03/06/2023 Medical Rec #:  [id number]      Height:       66.0 in Accession #:    [id number]     Weight:       140.7 lb Date of Birth:  [birth year]       BSA:          1.722 m Patient Age:    71 years       BP:           141/69 mmHg Patient Gender: F              HR:           79 bpm. Exam Location:  Outpatient Procedure: 2D Echo, Cardiac Doppler and Color Doppler Indications:    Dyspnea R06.00  History:        Patient has no prior history of Echocardiogram examinations. CAD                 and Previous Myocardial Infarction, COPD; Risk                 Factors:Dyslipidemia, Hypertension, Current Smoker and GERD.  Sonographer:    Eulah Pont RDCS Referring Phys: [id number] Theador Hawthorne ENIOLA IMPRESSIONS  1. Left ventricular  ejection fraction, by estimation, is 50 to 55%. The left ventricle has low normal function. The left ventricle demonstrates regional wall motion abnormalities (see scoring diagram/findings for description). Left ventricular diastolic  parameters are consistent with Grade I diastolic dysfunction (impaired relaxation). There is incoordinate septal motion.  2. Right ventricular systolic function is mildly reduced. The right ventricular size is normal. There is normal pulmonary artery systolic pressure. The estimated right ventricular systolic pressure is 33.7 mmHg.  3. There is no evidence of cardiac tamponade.  4. The mitral valve is grossly normal. Trivial mitral valve regurgitation.  5. The aortic valve is tricuspid. Aortic valve regurgitation is not visualized. Aortic valve sclerosis/calcification is present, without any evidence of aortic stenosis.  6. The inferior vena cava is normal in size with greater than 50% respiratory variability, suggesting right atrial pressure of 3 mmHg. Comparison(s): No prior Echocardiogram. FINDINGS  Left Ventricle: Left ventricular ejection fraction, by estimation, is 50 to 55%. The left ventricle has low normal function. The left ventricle demonstrates regional wall motion abnormalities. The left ventricular internal cavity size was normal in size. There is no left ventricular hypertrophy. Incoordinate septal motion. Left ventricular diastolic parameters are consistent with Grade I diastolic dysfunction (impaired relaxation). Indeterminate filling pressures. Right Ventricle: The right ventricular size is normal. No increase in right ventricular wall thickness. Right ventricular systolic function is mildly reduced. There is normal pulmonary artery systolic pressure. The tricuspid regurgitant velocity is 2.77 m/s, and with an assumed right atrial pressure of 3 mmHg, the estimated right ventricular systolic pressure is 33.7 mmHg. Left Atrium: Left atrial size was normal in size. Right  Atrium: Right atrial size was normal in size. Pericardium: Trivial pericardial effusion is present. The pericardial effusion is circumferential. There is no evidence of cardiac tamponade. Mitral Valve: The mitral valve is grossly normal. Trivial mitral valve regurgitation. Tricuspid Valve: The tricuspid valve is grossly normal. Tricuspid valve regurgitation is mild. Aortic Valve: The aortic valve is tricuspid. Aortic valve regurgitation is not visualized. Aortic valve sclerosis/calcification is present, without any evidence of aortic stenosis. Pulmonic Valve: The pulmonic valve  was grossly normal. Pulmonic valve regurgitation is not visualized. Aorta: The aortic root and ascending aorta are structurally normal, with no evidence of dilitation. Venous: The inferior vena cava is normal in size with greater than 50% respiratory variability, suggesting right atrial pressure of 3 mmHg. IAS/Shunts: No atrial level shunt detected by color flow Doppler. Additional Comments: There is pleural effusion in the left lateral region.  LEFT VENTRICLE PLAX 2D LVIDd:         4.20 cm     Diastology LVIDs:         2.90 cm     LV e' medial:    5.63 cm/s LV PW:         0.90 cm     LV E/e' medial:  13.0 LV IVS:        0.90 cm     LV e' lateral:   6.83 cm/s LVOT diam:     2.00 cm     LV E/e' lateral: 10.7 LV SV:         57 LV SV Index:   33 LVOT Area:     3.14 cm  LV Volumes (MOD) LV vol d, MOD A2C: 77.9 ml LV vol d, MOD A4C: 58.2 ml LV vol s, MOD A2C: 40.1 ml LV vol s, MOD A4C: 29.4 ml LV SV MOD A2C:     37.8 ml LV SV MOD A4C:     58.2 ml LV SV MOD BP:      32.7 ml RIGHT VENTRICLE RV S prime:     7.80 cm/s TAPSE (M-mode): 1.4 cm LEFT ATRIUM             Index        RIGHT ATRIUM           Index LA diam:        3.60 cm 2.09 cm/m   RA Area:     12.50 cm LA Vol (A2C):   47.9 ml 27.81 ml/m  RA Volume:   26.90 ml  15.62 ml/m LA Vol (A4C):   33.7 ml 19.57 ml/m LA Biplane Vol: 41.1 ml 23.87 ml/m  AORTIC VALVE LVOT Vmax:   107.00 cm/s LVOT  Vmean:  68.600 cm/s LVOT VTI:    0.183 m  AORTA Ao Root diam: 2.90 cm Ao Asc diam:  3.30 cm MITRAL VALVE                TRICUSPID VALVE MV Area (PHT): 4.36 cm     TR Peak grad:   30.7 mmHg MV Decel Time: 174 msec     TR Vmax:        277.00 cm/s MV E velocity: 73.30 cm/s MV A velocity: 118.00 cm/s  SHUNTS MV E/A ratio:  0.62         Systemic VTI:  0.18 m                             Systemic Diam: 2.00 cm Zoila Shutter MD Electronically signed by Zoila Shutter MD Signature Date/Time: 03/06/2023/3:54:08 PM    Final     ASSESSMENT AND PLAN: This is a very pleasant 72 years old white female diagnosed with a stage IV (T2 a, N2, M1 B) non-small cell lung cancer, adenocarcinoma presented with left upper lobe lung mass in addition to left hilar and precarinal metastatic adenopathy as well as small left lower lobe pulmonary nodule and solitary left frontal brain metastasis diagnosed in  July 2022.  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 The patient is status post SRS to the solitary brain metastasis. She underwent a course of concurrent chemoradiation to the locally advanced disease in the chest with carboplatin for AUC of 2 and paclitaxel 45 Mg/M2 status post 7 cycles.  The patient has been tolerating her treatment well with no concerning adverse effects. Her scan showed mild improvement of her disease with no concerning findings for progression. Technically the patient has a stage IV lung cancer with brain metastasis  The patient is currently undergoing systemic chemotherapy with carboplatin for AUC of 5, Alimta 500 Mg/M2 and Keytruda 200 Mg IV every 3 weeks status post 25 cycles.  Starting from cycle #5 the patient will be on maintenance treatment with Alimta and Keytruda every 3 weeks.   She continues to tolerate her treatment fairly well with no concerning adverse effect except for fatigue. The patient had repeat CT scan of the chest, abdomen and pelvis performed  recently.  I personally and independently reviewed the scan images and discussed the results with the patient today. Her scan showed no concerning findings for disease progression but she has new moderate left pleural effusion. I discussed with the patient referral to IR for consideration of ultrasound guided left thoracentesis but she declined. She will continue her current treatment with maintenance Alimta and Keytruda with cycle #26 today. I will see her back for follow-up visit in 3 weeks for evaluation before the next cycle of her treatment. The patient was advised to call immediately if she has any concerning symptoms in the interval. The patient voices understanding of current disease status and treatment options and is in agreement with the current care plan. The total time spent in the appointment was 30 minutes.  All questions were answered. The patient knows to call the clinic with any problems, questions or concerns. We can certainly see the patient much sooner if necessary.  Disclaimer: This note was dictated with voice recognition software. Similar sounding words can inadvertently be transcribed and may not be corrected upon review.

## 2023-03-15 ENCOUNTER — Other Ambulatory Visit: Payer: Self-pay

## 2023-03-15 ENCOUNTER — Telehealth: Payer: Self-pay

## 2023-03-15 DIAGNOSIS — E039 Hypothyroidism, unspecified: Secondary | ICD-10-CM

## 2023-03-15 DIAGNOSIS — C3490 Malignant neoplasm of unspecified part of unspecified bronchus or lung: Secondary | ICD-10-CM

## 2023-03-15 DIAGNOSIS — J441 Chronic obstructive pulmonary disease with (acute) exacerbation: Secondary | ICD-10-CM | POA: Diagnosis not present

## 2023-03-15 MED ORDER — FOLIC ACID 1 MG PO TABS
1.0000 mg | ORAL_TABLET | Freq: Every day | ORAL | 0 refills | Status: DC
Start: 2023-03-15 — End: 2023-07-24

## 2023-03-15 NOTE — Telephone Encounter (Signed)
This nurse received a call from this patient who stated that at her office visit on 03/14/23 the doctor informed her that she had fluid on her lung and she should have it removed.  Patient refused however she has reconsidered and would like to make arrangements to have fluid removed.  This nurse forward patient request to provider.

## 2023-03-18 ENCOUNTER — Other Ambulatory Visit: Payer: Self-pay | Admitting: Physician Assistant

## 2023-03-18 DIAGNOSIS — E039 Hypothyroidism, unspecified: Secondary | ICD-10-CM

## 2023-03-18 DIAGNOSIS — C3490 Malignant neoplasm of unspecified part of unspecified bronchus or lung: Secondary | ICD-10-CM

## 2023-04-04 ENCOUNTER — Inpatient Hospital Stay: Payer: Medicare HMO

## 2023-04-04 ENCOUNTER — Other Ambulatory Visit: Payer: Medicare HMO

## 2023-04-04 ENCOUNTER — Encounter: Payer: Self-pay | Admitting: Medical Oncology

## 2023-04-04 ENCOUNTER — Inpatient Hospital Stay (HOSPITAL_BASED_OUTPATIENT_CLINIC_OR_DEPARTMENT_OTHER): Payer: Medicare HMO | Admitting: Internal Medicine

## 2023-04-04 VITALS — BP 133/69 | HR 77 | Resp 17

## 2023-04-04 VITALS — BP 134/71 | HR 86 | Temp 97.7°F | Resp 17 | Wt 140.3 lb

## 2023-04-04 DIAGNOSIS — E039 Hypothyroidism, unspecified: Secondary | ICD-10-CM | POA: Diagnosis not present

## 2023-04-04 DIAGNOSIS — Z79899 Other long term (current) drug therapy: Secondary | ICD-10-CM | POA: Diagnosis not present

## 2023-04-04 DIAGNOSIS — Z923 Personal history of irradiation: Secondary | ICD-10-CM | POA: Diagnosis not present

## 2023-04-04 DIAGNOSIS — J90 Pleural effusion, not elsewhere classified: Secondary | ICD-10-CM | POA: Diagnosis not present

## 2023-04-04 DIAGNOSIS — C3412 Malignant neoplasm of upper lobe, left bronchus or lung: Secondary | ICD-10-CM

## 2023-04-04 DIAGNOSIS — E785 Hyperlipidemia, unspecified: Secondary | ICD-10-CM | POA: Diagnosis not present

## 2023-04-04 DIAGNOSIS — K429 Umbilical hernia without obstruction or gangrene: Secondary | ICD-10-CM | POA: Diagnosis not present

## 2023-04-04 DIAGNOSIS — Z8719 Personal history of other diseases of the digestive system: Secondary | ICD-10-CM | POA: Diagnosis not present

## 2023-04-04 DIAGNOSIS — F17210 Nicotine dependence, cigarettes, uncomplicated: Secondary | ICD-10-CM | POA: Diagnosis not present

## 2023-04-04 DIAGNOSIS — Z79620 Long term (current) use of immunosuppressive biologic: Secondary | ICD-10-CM | POA: Diagnosis not present

## 2023-04-04 DIAGNOSIS — R601 Generalized edema: Secondary | ICD-10-CM | POA: Diagnosis not present

## 2023-04-04 DIAGNOSIS — Z79890 Hormone replacement therapy (postmenopausal): Secondary | ICD-10-CM | POA: Diagnosis not present

## 2023-04-04 DIAGNOSIS — Z5112 Encounter for antineoplastic immunotherapy: Secondary | ICD-10-CM | POA: Diagnosis not present

## 2023-04-04 DIAGNOSIS — I252 Old myocardial infarction: Secondary | ICD-10-CM | POA: Diagnosis not present

## 2023-04-04 DIAGNOSIS — M8589 Other specified disorders of bone density and structure, multiple sites: Secondary | ICD-10-CM | POA: Diagnosis not present

## 2023-04-04 DIAGNOSIS — Z95828 Presence of other vascular implants and grafts: Secondary | ICD-10-CM

## 2023-04-04 DIAGNOSIS — Z79631 Long term (current) use of antimetabolite agent: Secondary | ICD-10-CM | POA: Diagnosis not present

## 2023-04-04 DIAGNOSIS — Z87440 Personal history of urinary (tract) infections: Secondary | ICD-10-CM | POA: Diagnosis not present

## 2023-04-04 DIAGNOSIS — Z9221 Personal history of antineoplastic chemotherapy: Secondary | ICD-10-CM | POA: Diagnosis not present

## 2023-04-04 DIAGNOSIS — R0609 Other forms of dyspnea: Secondary | ICD-10-CM | POA: Diagnosis not present

## 2023-04-04 DIAGNOSIS — R5383 Other fatigue: Secondary | ICD-10-CM | POA: Diagnosis not present

## 2023-04-04 DIAGNOSIS — C3490 Malignant neoplasm of unspecified part of unspecified bronchus or lung: Secondary | ICD-10-CM

## 2023-04-04 DIAGNOSIS — C7931 Secondary malignant neoplasm of brain: Secondary | ICD-10-CM | POA: Diagnosis not present

## 2023-04-04 DIAGNOSIS — J449 Chronic obstructive pulmonary disease, unspecified: Secondary | ICD-10-CM | POA: Diagnosis not present

## 2023-04-04 DIAGNOSIS — Z5111 Encounter for antineoplastic chemotherapy: Secondary | ICD-10-CM | POA: Diagnosis not present

## 2023-04-04 DIAGNOSIS — N281 Cyst of kidney, acquired: Secondary | ICD-10-CM | POA: Diagnosis not present

## 2023-04-04 LAB — CBC WITH DIFFERENTIAL (CANCER CENTER ONLY)
Abs Immature Granulocytes: 0.02 10*3/uL (ref 0.00–0.07)
Basophils Absolute: 0.1 10*3/uL (ref 0.0–0.1)
Basophils Relative: 1 %
Eosinophils Absolute: 0.3 10*3/uL (ref 0.0–0.5)
Eosinophils Relative: 4 %
HCT: 34.6 % — ABNORMAL LOW (ref 36.0–46.0)
Hemoglobin: 11.3 g/dL — ABNORMAL LOW (ref 12.0–15.0)
Immature Granulocytes: 0 %
Lymphocytes Relative: 19 %
Lymphs Abs: 1.3 10*3/uL (ref 0.7–4.0)
MCH: 34.5 pg — ABNORMAL HIGH (ref 26.0–34.0)
MCHC: 32.7 g/dL (ref 30.0–36.0)
MCV: 105.5 fL — ABNORMAL HIGH (ref 80.0–100.0)
Monocytes Absolute: 0.7 10*3/uL (ref 0.1–1.0)
Monocytes Relative: 10 %
Neutro Abs: 4.5 10*3/uL (ref 1.7–7.7)
Neutrophils Relative %: 66 %
Platelet Count: 439 10*3/uL — ABNORMAL HIGH (ref 150–400)
RBC: 3.28 MIL/uL — ABNORMAL LOW (ref 3.87–5.11)
RDW: 14.5 % (ref 11.5–15.5)
WBC Count: 6.8 10*3/uL (ref 4.0–10.5)
nRBC: 0 % (ref 0.0–0.2)

## 2023-04-04 LAB — CMP (CANCER CENTER ONLY)
ALT: 11 U/L (ref 0–44)
AST: 22 U/L (ref 15–41)
Albumin: 3.5 g/dL (ref 3.5–5.0)
Alkaline Phosphatase: 82 U/L (ref 38–126)
Anion gap: 5 (ref 5–15)
BUN: 13 mg/dL (ref 8–23)
CO2: 30 mmol/L (ref 22–32)
Calcium: 9 mg/dL (ref 8.9–10.3)
Chloride: 105 mmol/L (ref 98–111)
Creatinine: 1.01 mg/dL — ABNORMAL HIGH (ref 0.44–1.00)
GFR, Estimated: 58 mL/min — ABNORMAL LOW (ref >60)
Glucose, Bld: 113 mg/dL — ABNORMAL HIGH (ref 70–99)
Potassium: 4 mmol/L (ref 3.5–5.1)
Sodium: 140 mmol/L (ref 135–145)
Total Bilirubin: 0.3 mg/dL (ref 0.3–1.2)
Total Protein: 7.4 g/dL (ref 6.5–8.1)

## 2023-04-04 LAB — TSH: TSH: 11.445 u[IU]/mL — ABNORMAL HIGH (ref 0.350–4.500)

## 2023-04-04 MED ORDER — SODIUM CHLORIDE 0.9 % IV SOLN: Freq: Once | INTRAVENOUS | Status: AC

## 2023-04-04 MED ORDER — SODIUM CHLORIDE 0.9 % IV SOLN
500.0000 mg/m2 | Freq: Once | INTRAVENOUS | Status: AC
  Administered 2023-04-04: 900 mg via INTRAVENOUS
  Filled 2023-04-04: qty 20

## 2023-04-04 MED ORDER — SODIUM CHLORIDE 0.9% FLUSH
10.0000 mL | INTRAVENOUS | Status: DC | PRN
  Administered 2023-04-04: 10 mL

## 2023-04-04 MED ORDER — PROCHLORPERAZINE MALEATE 10 MG PO TABS
10.0000 mg | ORAL_TABLET | Freq: Once | ORAL | Status: AC
  Administered 2023-04-04: 10 mg via ORAL
  Filled 2023-04-04: qty 1

## 2023-04-04 MED ORDER — SODIUM CHLORIDE 0.9% FLUSH
10.0000 mL | Freq: Once | INTRAVENOUS | Status: AC
  Administered 2023-04-04: 10 mL

## 2023-04-04 MED ORDER — DIPHENHYDRAMINE HCL 25 MG PO CAPS
25.0000 mg | ORAL_CAPSULE | Freq: Once | ORAL | Status: AC
  Administered 2023-04-04: 25 mg via ORAL
  Filled 2023-04-04: qty 1

## 2023-04-04 MED ORDER — CYANOCOBALAMIN 1000 MCG/ML IJ SOLN
1000.0000 ug | Freq: Once | INTRAMUSCULAR | Status: AC
  Administered 2023-04-04: 1000 ug via INTRAMUSCULAR
  Filled 2023-04-04: qty 1

## 2023-04-04 MED ORDER — HEPARIN SOD (PORK) LOCK FLUSH 100 UNIT/ML IV SOLN
500.0000 [IU] | Freq: Once | INTRAVENOUS | Status: AC | PRN
  Administered 2023-04-04: 500 [IU]

## 2023-04-04 MED ORDER — SODIUM CHLORIDE 0.9 % IV SOLN
200.0000 mg | Freq: Once | INTRAVENOUS | Status: AC
  Administered 2023-04-04: 200 mg via INTRAVENOUS
  Filled 2023-04-04: qty 200

## 2023-04-04 NOTE — Progress Notes (Signed)
Patient seen by Dr. Mohamed  Vitals are within treatment parameters.  Labs reviewed: and are within treatment parameters.  Per physician team, patient is ready for treatment and there are NO modifications to the treatment plan.  

## 2023-04-04 NOTE — Progress Notes (Signed)
[facility] Telephone:([phone]) [phone]   [phone]  OFFICE PROGRESS NOTE  [name], MD [address] [place] [address]  DIAGNOSIS: Stage IV (T2 a, N2, M1b) non-small cell lung cancer, adenocarcinoma presented with left upper lobe lung mass in addition to left hilar and precarinal metastatic adenopathy and small left lower lobe pulmonary nodule in addition to solitary left frontal brain metastasis diagnosed in July 2022.  Molecular studies by [employer] showed  Positive KRAS G12C mutation PD-L1 expression was less than 1%   PRIOR THERAPY:  1) Status post SRS to the solitary brain metastasis. 20 Treatment for the locally advanced disease in the chest with carboplatin for AUC of 2 and paclitaxel 45 Mg/M2.  Status post 6 cycles.  Last dose was given 07/25/2021.  CURRENT THERAPY: Systemic chemotherapy with carboplatin for AUC of 5, Alimta 500 Mg/M2 and Keytruda 200 Mg IV every 3 weeks.  First dose August 31, 2021.  Status post 26 cycles.  Starting from cycle #5 the patient will be on maintenance treatment with Alimta and Keytruda every 3 weeks.  INTERVAL HISTORY: Jill [name] 72 y.o. female returns to the clinic today for follow-up visit.  The patient is feeling fine today with no concerning complaints.  She denied having any chest pain but has shortness of breath at baseline increased with exertion with no cough or hemoptysis.  She has no nausea, vomiting, diarrhea or constipation.  She has no headache or visual changes.  The patient is here today for evaluation before starting cycle #27.  MEDICAL HISTORY: Past Medical History:  Diagnosis Date   Anxiety    ON PAXIL, XANAX   AVM (arteriovenous malformation) brain    s/p stent/coil   Blood transfusion    x 2   Cancer (HCC)    Left lung   COPD (chronic obstructive pulmonary disease) (HCC)    no inhaler, no oxygen   Coronary artery disease    Prior inferior MI with stent to RCA, s/p  CABG in 2008   Depression    Dizziness    Dyspnea    with exertion, no oxygen   Fatigue    Foot injury 06/01/2017   right - RESOLVED, no longer an issue per patient 05/18/21   GERD (gastroesophageal reflux disease)    Headache(784.0)    UNRUPTURED CEREBRAL ANEURYSM   Hyperlipidemia    Hypertension    Hypothyroidism    Infected cyst of skin 09/18/2013   Left knee pain 06/05/2012   Lung mass    Left lung   Myocardial infarction (HCC)    Normal nuclear stress test Ju;y 2012   No ischemia. EF 70%; fixed defect involving septum, inferoseptal and inferior wall   Pain, dental 02/06/2017   Poor dental hygiene    Retroperitoneal bleeding    Following cardiac cath   Tobacco abuse    Urine discoloration 09/18/2013   Urine incontinence 07/06/2020   UTI (lower urinary tract infection) 10/16/2013    ALLERGIES:  is allergic to varenicline, ace inhibitors, prednisone, betalin 12 [vitamin b12], penicillins, and sulfa drugs cross reactors.  MEDICATIONS:  Current Outpatient Medications  Medication Sig Dispense Refill   acetaminophen (TYLENOL) 500 MG tablet Take 500 mg by mouth every 6 (six) hours as needed.     albuterol (PROVENTIL) (2.5 MG/3ML) 0.083% nebulizer solution Take 3 mLs (2.5 mg total) by nebulization every 6 (six) hours as needed for wheezing or shortness of breath. 75 mL 12  albuterol (VENTOLIN HFA) 108 (90 Base) MCG/ACT inhaler Inhale 2 puffs into the lungs every 6 (six) hours as needed for wheezing or shortness of breath. 8 g 2   amLODipine (NORVASC) 10 MG tablet TAKE 1 TABLET BY MOUTH ONCE DAILY . APPOINTMENT REQUIRED FOR FUTURE REFILLS 90 tablet 1   aspirin EC 81 MG tablet Take 1 tablet (81 mg total) by mouth daily. 90 tablet 2   atorvastatin (LIPITOR) 40 MG tablet Take 1 tablet (40 mg total) by mouth daily. 90 tablet 1   Cholecalciferol (VITAMIN D3) 250 MCG (10000 UT) capsule Take 10,000 mcg by mouth daily.     diphenhydrAMINE HCl (BENADRYL ALLERGY PO) Take 25 mg by mouth at  bedtime as needed (for sleep).     folic acid (FOLVITE) 1 MG tablet Take 1 tablet (1 mg total) by mouth daily. 90 tablet 0   hydrocortisone cream 0.5 % Apply 1 Application topically 2 (two) times daily. As needed for itching. 30 g 0   levothyroxine (SYNTHROID) 150 MCG tablet TAKE 1 TABLET BY MOUTH DAILY BEFORE BREAKFAST. 90 tablet 0   lidocaine-prilocaine (EMLA) cream Apply 1 Application topically as needed. 30 g 2   losartan (COZAAR) 100 MG tablet Take 1 tablet (100 mg total) by mouth daily. 90 tablet 1   metoprolol succinate (TOPROL-XL) 50 MG 24 hr tablet TAKE 1 TABLET BY MOUTH ONCE DAILY WITH MEALS OR  IMMEDIATLEY  AFTER  A  MEAL 90 tablet 1   mirtazapine (REMERON) 15 MG tablet TAKE 1 TABLET BY MOUTH EVERYDAY AT BEDTIME 90 tablet 1   Multiple Vitamins-Minerals (MULTIVITAMIN WITH MINERALS) tablet Take 1 tablet by mouth daily.     nitroGLYCERIN (NITROSTAT) 0.4 MG SL tablet Place 1 tablet (0.4 mg total) under the tongue every 5 (five) minutes as needed. For chest pain 25 tablet 6   prochlorperazine (COMPAZINE) 10 MG tablet TAKE 1 TABLET BY MOUTH EVERY 6 HOURS AS NEEDED FOR NAUSEA FOR VOMITING 30 tablet 0   umeclidinium-vilanterol (ANORO ELLIPTA) 62.5-25 MCG/ACT AEPB Inhale 1 puff into the lungs daily. 60 each 1   Vitamin A 2400 MCG (8000 UT) TABS Take 2,400 mg by mouth daily.     No current facility-administered medications for this visit.    SURGICAL HISTORY:  Past Surgical History:  Procedure Laterality Date   CARDIAC CATHETERIZATION  01/02/2007   IT REVEALS MILD INFERIOR WALL HYPOKINESIS. THE EJECTION FRACTION IS AROUND 50%   COLONOSCOPY     CORONARY ARTERY BYPASS GRAFT  11/06/2006   LIMA to LAD, SVG to DX, SVG to LCX & SVG to OM 1 & 2, and SVG to PD   CORONARY STENT PLACEMENT     Remote past stent to RCA   EYE SURGERY Right    cataracts removed   HEMORRHOID SURGERY  11/07/1987   IR IMAGING GUIDED PORT INSERTION  10/04/2022   UPPER GI ENDOSCOPY     VENTRICULOSTOMY  10/06/2011    Procedure: VENTRICULOSTOMY;  Surgeon: Carmela Hurt;  Location: [facility];  Service: Neurosurgery;  Laterality: Right;  Insertion of Ventriculostomy Catheter   VIDEO BRONCHOSCOPY WITH ENDOBRONCHIAL NAVIGATION Left 05/20/2021   Procedure: VIDEO BRONCHOSCOPY WITH ENDOBRONCHIAL NAVIGATION;  Surgeon: Josephine Igo, DO;  Location: [facility] OR;  Service: Pulmonary;  Laterality: Left;   VIDEO BRONCHOSCOPY WITH ENDOBRONCHIAL ULTRASOUND Bilateral 05/20/2021   Procedure: VIDEO BRONCHOSCOPY WITH ENDOBRONCHIAL ULTRASOUND;  Surgeon: Josephine Igo, DO;  Location: [facility] OR;  Service: Pulmonary;  Laterality: Bilateral;   WISDOM TOOTH EXTRACTION  REVIEW OF SYSTEMS:  A comprehensive review of systems was negative except for: Constitutional: positive for fatigue Respiratory: positive for dyspnea on exertion   PHYSICAL EXAMINATION: General appearance: alert, cooperative, fatigued, and no distress Head: Normocephalic, without obvious abnormality, atraumatic Neck: no adenopathy, no JVD, supple, symmetrical, trachea midline, and thyroid not enlarged, symmetric, no tenderness/mass/nodules Lymph nodes: Cervical, supraclavicular, and axillary nodes normal. Resp: clear to auscultation bilaterally Back: symmetric, no curvature. ROM normal. No CVA tenderness. Cardio: regular rate and rhythm, S1, S2 normal, no murmur, click, rub or gallop GI: soft, non-tender; bowel sounds normal; no masses,  no organomegaly Extremities: extremities normal, atraumatic, no cyanosis or edema  ECOG PERFORMANCE STATUS: 1 - Symptomatic but completely ambulatory  Blood pressure 134/71, pulse 86, temperature 97.7 F (36.5 C), temperature source Oral, resp. rate 17, weight 140 lb 4.8 oz (63.6 kg), SpO2 91 %.  LABORATORY DATA: Lab Results  Component Value Date   WBC 6.1 03/14/2023   HGB 11.8 (L) 03/14/2023   HCT 36.7 03/14/2023   MCV 107.3 (H) 03/14/2023   PLT 435 (H) 03/14/2023      Chemistry      Component Value Date/Time   NA  140 03/14/2023 0849   NA 141 04/12/2021 1356   K 4.1 03/14/2023 0849   CL 104 03/14/2023 0849   CO2 33 (H) 03/14/2023 0849   BUN 11 03/14/2023 0849   BUN 13 04/12/2021 1356   CREATININE 1.01 (H) 03/14/2023 0849   CREATININE 0.99 08/29/2016 1130      Component Value Date/Time   CALCIUM 8.9 03/14/2023 0849   ALKPHOS 81 03/14/2023 0849   AST 20 03/14/2023 0849   ALT 9 03/14/2023 0849   BILITOT 0.3 03/14/2023 0849       RADIOGRAPHIC STUDIES: CT Chest W Contrast  Result Date: 03/07/2023 CLINICAL DATA:  Non-small-cell lung cancer. Left upper lobe adenocarcinoma with hilar and precarinal nodes. * Tracking Code: BO * EXAM: CT CHEST, ABDOMEN, AND PELVIS WITH CONTRAST TECHNIQUE: Multidetector CT imaging of the chest, abdomen and pelvis was performed following the standard protocol during bolus administration of intravenous contrast. RADIATION DOSE REDUCTION: This exam was performed according to the departmental dose-optimization program which includes automated exposure control, adjustment of the mA and/or kV according to patient size and/or use of iterative reconstruction technique. CONTRAST:  OMNIPAQUE IOHEXOL 300 MG/ML  SOLN COMPARISON:  CT scan 10/27/2022 FINDINGS: CT CHEST FINDINGS Cardiovascular: Status post median sternotomy. Heart is nonenlarged. Trace pericardial fluid. Coronary artery calcifications are seen. There are some calcifications along the aortic valve. Bovine type aortic arch, normal variant. Scattered vascular calcifications along the thoracic aorta and branch vessels. Right upper chest port. Mediastinum/Nodes: Slight wall thickening along the lower esophagus, unchanged from previous. There is some mild edema of the mediastinal fat. Small thyroid gland. No specific abnormal lymph node enlargement identified in the axillary region, hilum or mediastinum. Lungs/Pleura: Diffuse centrilobular emphysematous lung changes once again identified. Scattered reticular changes. Right lung  is without consolidation, pneumothorax or effusion. Subtle small ground-glass area focally in the middle lobe on series 4, image 105 is stable. There is new moderate left pleural effusion. Compressive atelectasis is increased in the area of the lingula dependently. The ill-defined spiculated masslike area in the inferolateral left upper lobe extending out to the pleura laterally is again seen. Previously this area was measured at 2.8 x 2.3 cm on the previous exam and today when measured in the same fashion on series 4, image 65 would be estimated at  2.4 by 2.0 cm. Slightly smaller. No new dominant left-sided lung nodule. Musculoskeletal: Scattered degenerative changes of the spine. Osteopenia CT ABDOMEN PELVIS FINDINGS Hepatobiliary: Patent portal vein. Gallbladder is nondilated. There are some tiny low-attenuation lesion seen in the liver, unchanged from previous. These are too small to completely characterize although likely small cystic lesions and no specific imaging follow-up. There is also focal fat deposition seen in the liver adjacent to the falciform ligament. The cystic like areas seen in the porta hepatis measuring 2.3 cm on the prior is unchanged today when adjusting for technique. Pancreas: Unremarkable. No pancreatic ductal dilatation or surrounding inflammatory changes. Spleen: Normal in size without focal abnormality.  Small splenule. Adrenals/Urinary Tract: Adrenal glands are preserved. Small bilateral Bosniak 1 renal cysts are seen. Largest is exophytic from the anterior aspect of the left kidney measuring 2.8 cm in diameter with Hounsfield unit of 20. not significantly changed when adjusting for technique. No collecting system dilatation. The ureters have normal course and caliber extending down to the bladder. Preserved contours of the urinary bladder. Stomach/Bowel: Moderate colonic stool. Large bowel is nondilated. Slightly tortuous course to the colon. Stomach and small bowel are nondilated.  Fundal posterior gastric diverticulum. Again mild gastric fold thickening. Normal appendix. Vascular/Lymphatic: Diffuse vascular calcifications along the aorta and branch vessels. Areas of stenosis seen particularly along the iliac vessels, renal arteries. Please correlate with any particular symptoms. Normal course and caliber of the IVC. No discrete abnormal lymph node enlargement present in the abdomen and pelvis. Reproductive: Uterus and bilateral adnexa are unremarkable. Probable Bartholin's gland cysts along the right side of the vagina. Other: Mild anasarca. Small fat containing umbilical hernia. No free air or free fluid. Musculoskeletal: Mild degenerative changes along the spine with some osteopenia. Mild sclerosis along the left femoral head, nonspecific. IMPRESSION: Slight decrease in size left upper lobe spiculated nodule. No developing other new mass lesion, lymph node enlargement. New moderate left pleural effusion. Stable renal cystic lesions including the borderline dense structure on the left. Simple attention on continued follow-up for the patient's neoplasm. Moderate colonic stool. Stable complex cystic focus in the area of the porta hepatis of uncertain etiology and significance. Electronically Signed   By: Karen Kays M.D.   On: 03/07/2023 15:49   CT Abdomen Pelvis W Contrast  Result Date: 03/07/2023 CLINICAL DATA:  Non-small-cell lung cancer. Left upper lobe adenocarcinoma with hilar and precarinal nodes. * Tracking Code: BO * EXAM: CT CHEST, ABDOMEN, AND PELVIS WITH CONTRAST TECHNIQUE: Multidetector CT imaging of the chest, abdomen and pelvis was performed following the standard protocol during bolus administration of intravenous contrast. RADIATION DOSE REDUCTION: This exam was performed according to the departmental dose-optimization program which includes automated exposure control, adjustment of the mA and/or kV according to patient size and/or use of iterative reconstruction  technique. CONTRAST:  OMNIPAQUE IOHEXOL 300 MG/ML  SOLN COMPARISON:  CT scan 10/27/2022 FINDINGS: CT CHEST FINDINGS Cardiovascular: Status post median sternotomy. Heart is nonenlarged. Trace pericardial fluid. Coronary artery calcifications are seen. There are some calcifications along the aortic valve. Bovine type aortic arch, normal variant. Scattered vascular calcifications along the thoracic aorta and branch vessels. Right upper chest port. Mediastinum/Nodes: Slight wall thickening along the lower esophagus, unchanged from previous. There is some mild edema of the mediastinal fat. Small thyroid gland. No specific abnormal lymph node enlargement identified in the axillary region, hilum or mediastinum. Lungs/Pleura: Diffuse centrilobular emphysematous lung changes once again identified. Scattered reticular changes. Right lung is without consolidation,  pneumothorax or effusion. Subtle small ground-glass area focally in the middle lobe on series 4, image 105 is stable. There is new moderate left pleural effusion. Compressive atelectasis is increased in the area of the lingula dependently. The ill-defined spiculated masslike area in the inferolateral left upper lobe extending out to the pleura laterally is again seen. Previously this area was measured at 2.8 x 2.3 cm on the previous exam and today when measured in the same fashion on series 4, image 65 would be estimated at 2.4 by 2.0 cm. Slightly smaller. No new dominant left-sided lung nodule. Musculoskeletal: Scattered degenerative changes of the spine. Osteopenia CT ABDOMEN PELVIS FINDINGS Hepatobiliary: Patent portal vein. Gallbladder is nondilated. There are some tiny low-attenuation lesion seen in the liver, unchanged from previous. These are too small to completely characterize although likely small cystic lesions and no specific imaging follow-up. There is also focal fat deposition seen in the liver adjacent to the falciform ligament. The cystic like  areas seen in the porta hepatis measuring 2.3 cm on the prior is unchanged today when adjusting for technique. Pancreas: Unremarkable. No pancreatic ductal dilatation or surrounding inflammatory changes. Spleen: Normal in size without focal abnormality.  Small splenule. Adrenals/Urinary Tract: Adrenal glands are preserved. Small bilateral Bosniak 1 renal cysts are seen. Largest is exophytic from the anterior aspect of the left kidney measuring 2.8 cm in diameter with Hounsfield unit of 20. not significantly changed when adjusting for technique. No collecting system dilatation. The ureters have normal course and caliber extending down to the bladder. Preserved contours of the urinary bladder. Stomach/Bowel: Moderate colonic stool. Large bowel is nondilated. Slightly tortuous course to the colon. Stomach and small bowel are nondilated. Fundal posterior gastric diverticulum. Again mild gastric fold thickening. Normal appendix. Vascular/Lymphatic: Diffuse vascular calcifications along the aorta and branch vessels. Areas of stenosis seen particularly along the iliac vessels, renal arteries. Please correlate with any particular symptoms. Normal course and caliber of the IVC. No discrete abnormal lymph node enlargement present in the abdomen and pelvis. Reproductive: Uterus and bilateral adnexa are unremarkable. Probable Bartholin's gland cysts along the right side of the vagina. Other: Mild anasarca. Small fat containing umbilical hernia. No free air or free fluid. Musculoskeletal: Mild degenerative changes along the spine with some osteopenia. Mild sclerosis along the left femoral head, nonspecific. IMPRESSION: Slight decrease in size left upper lobe spiculated nodule. No developing other new mass lesion, lymph node enlargement. New moderate left pleural effusion. Stable renal cystic lesions including the borderline dense structure on the left. Simple attention on continued follow-up for the patient's neoplasm. Moderate  colonic stool. Stable complex cystic focus in the area of the porta hepatis of uncertain etiology and significance. Electronically Signed   By: Karen Kays M.D.   On: 03/07/2023 15:49   ECHOCARDIOGRAM COMPLETE  Result Date: 03/06/2023    ECHOCARDIOGRAM REPORT   Patient Name:   Jill [name] Date of Exam: 03/06/2023 Medical Rec #:  [id number]      Height:       66.0 in Accession #:    [id number]     Weight:       140.7 lb Date of Birth:  Jan 14, 1951       BSA:          1.722 m Patient Age:    71 years       BP:           141/69 mmHg Patient Gender: F  HR:           79 bpm. Exam Location:  Outpatient Procedure: 2D Echo, Cardiac Doppler and Color Doppler Indications:    Dyspnea R06.00  History:        Patient has no prior history of Echocardiogram examinations. CAD                 and Previous Myocardial Infarction, COPD; Risk                 Factors:Dyslipidemia, Hypertension, Current Smoker and GERD.  Sonographer:    Eulah Pont RDCS Referring Phys: [id number] Theador Hawthorne ENIOLA IMPRESSIONS  1. Left ventricular ejection fraction, by estimation, is 50 to 55%. The left ventricle has low normal function. The left ventricle demonstrates regional wall motion abnormalities (see scoring diagram/findings for description). Left ventricular diastolic  parameters are consistent with Grade I diastolic dysfunction (impaired relaxation). There is incoordinate septal motion.  2. Right ventricular systolic function is mildly reduced. The right ventricular size is normal. There is normal pulmonary artery systolic pressure. The estimated right ventricular systolic pressure is 33.7 mmHg.  3. There is no evidence of cardiac tamponade.  4. The mitral valve is grossly normal. Trivial mitral valve regurgitation.  5. The aortic valve is tricuspid. Aortic valve regurgitation is not visualized. Aortic valve sclerosis/calcification is present, without any evidence of aortic stenosis.  6. The inferior vena cava is normal in size with  greater than 50% respiratory variability, suggesting right atrial pressure of 3 mmHg. Comparison(s): No prior Echocardiogram. FINDINGS  Left Ventricle: Left ventricular ejection fraction, by estimation, is 50 to 55%. The left ventricle has low normal function. The left ventricle demonstrates regional wall motion abnormalities. The left ventricular internal cavity size was normal in size. There is no left ventricular hypertrophy. Incoordinate septal motion. Left ventricular diastolic parameters are consistent with Grade I diastolic dysfunction (impaired relaxation). Indeterminate filling pressures. Right Ventricle: The right ventricular size is normal. No increase in right ventricular wall thickness. Right ventricular systolic function is mildly reduced. There is normal pulmonary artery systolic pressure. The tricuspid regurgitant velocity is 2.77 m/s, and with an assumed right atrial pressure of 3 mmHg, the estimated right ventricular systolic pressure is 33.7 mmHg. Left Atrium: Left atrial size was normal in size. Right Atrium: Right atrial size was normal in size. Pericardium: Trivial pericardial effusion is present. The pericardial effusion is circumferential. There is no evidence of cardiac tamponade. Mitral Valve: The mitral valve is grossly normal. Trivial mitral valve regurgitation. Tricuspid Valve: The tricuspid valve is grossly normal. Tricuspid valve regurgitation is mild. Aortic Valve: The aortic valve is tricuspid. Aortic valve regurgitation is not visualized. Aortic valve sclerosis/calcification is present, without any evidence of aortic stenosis. Pulmonic Valve: The pulmonic valve was grossly normal. Pulmonic valve regurgitation is not visualized. Aorta: The aortic root and ascending aorta are structurally normal, with no evidence of dilitation. Venous: The inferior vena cava is normal in size with greater than 50% respiratory variability, suggesting right atrial pressure of 3 mmHg. IAS/Shunts: No  atrial level shunt detected by color flow Doppler. Additional Comments: There is pleural effusion in the left lateral region.  LEFT VENTRICLE PLAX 2D LVIDd:         4.20 cm     Diastology LVIDs:         2.90 cm     LV e' medial:    5.63 cm/s LV PW:         0.90 cm  LV E/e' medial:  13.0 LV IVS:        0.90 cm     LV e' lateral:   6.83 cm/s LVOT diam:     2.00 cm     LV E/e' lateral: 10.7 LV SV:         57 LV SV Index:   33 LVOT Area:     3.14 cm  LV Volumes (MOD) LV vol d, MOD A2C: 77.9 ml LV vol d, MOD A4C: 58.2 ml LV vol s, MOD A2C: 40.1 ml LV vol s, MOD A4C: 29.4 ml LV SV MOD A2C:     37.8 ml LV SV MOD A4C:     58.2 ml LV SV MOD BP:      32.7 ml RIGHT VENTRICLE RV S prime:     7.80 cm/s TAPSE (M-mode): 1.4 cm LEFT ATRIUM             Index        RIGHT ATRIUM           Index LA diam:        3.60 cm 2.09 cm/m   RA Area:     12.50 cm LA Vol (A2C):   47.9 ml 27.81 ml/m  RA Volume:   26.90 ml  15.62 ml/m LA Vol (A4C):   33.7 ml 19.57 ml/m LA Biplane Vol: 41.1 ml 23.87 ml/m  AORTIC VALVE LVOT Vmax:   107.00 cm/s LVOT Vmean:  68.600 cm/s LVOT VTI:    0.183 m  AORTA Ao Root diam: 2.90 cm Ao Asc diam:  3.30 cm MITRAL VALVE                TRICUSPID VALVE MV Area (PHT): 4.36 cm     TR Peak grad:   30.7 mmHg MV Decel Time: 174 msec     TR Vmax:        277.00 cm/s MV E velocity: 73.30 cm/s MV A velocity: 118.00 cm/s  SHUNTS MV E/A ratio:  0.62         Systemic VTI:  0.18 m                             Systemic Diam: 2.00 cm Zoila Shutter MD Electronically signed by Zoila Shutter MD Signature Date/Time: 03/06/2023/3:54:08 PM    Final     ASSESSMENT AND PLAN: This is a very pleasant 72 years old white female diagnosed with a stage IV (T2 a, N2, M1 B) non-small cell lung cancer, adenocarcinoma presented with left upper lobe lung mass in addition to left hilar and precarinal metastatic adenopathy as well as small left lower lobe pulmonary nodule and solitary left frontal brain metastasis diagnosed in July 2022.   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 The patient is status post SRS to the solitary brain metastasis. She underwent a course of concurrent chemoradiation to the locally advanced disease in the chest with carboplatin for AUC of 2 and paclitaxel 45 Mg/M2 status post 7 cycles.  The patient has been tolerating her treatment well with no concerning adverse effects. Her scan showed mild improvement of her disease with no concerning findings for progression. Technically the patient has a stage IV lung cancer with brain metastasis  The patient is currently undergoing systemic chemotherapy with carboplatin for AUC of 5, Alimta 500 Mg/M2 and Keytruda 200 Mg IV every 3 weeks status post 26 cycles.  Starting  from cycle #5 the patient will be on maintenance treatment with Alimta and Keytruda every 3 weeks.   The patient has been tolerating this treatment well with no concerning adverse effects except for mild fatigue. I recommended for her to proceed with cycle #27 today as planned. For the shortness of breath, I reviewed the recent scan images with the patient and she has moderate left pleural effusion.  I gave her the option of proceeding with ultrasound-guided thoracentesis versus close monitoring.  She would like to monitor it for now unless she is more short of breath. For the anemia, I advised her to take oral iron tablet every other day with vitamin C. The patient will come back for follow-up visit in 3 weeks for evaluation before the next cycle of her treatment. She was advised to call immediately if she has any other concerning symptoms in the interval. The patient voices understanding of current disease status and treatment options and is in agreement with the current care plan. The total time spent in the appointment was 20 minutes.  All questions were answered. The patient knows to call the clinic with any problems, questions or concerns. We can  certainly see the patient much sooner if necessary.  Disclaimer: This note was dictated with voice recognition software. Similar sounding words can inadvertently be transcribed and may not be corrected upon review.

## 2023-04-04 NOTE — Patient Instructions (Signed)
[facility]  Discharge Instructions: Thank you for choosing [facility] to provide your oncology and hematology care.   If you have a lab appointment with the Cancer Center, please go directly to the Cancer Center and check in at the registration area.   Wear comfortable clothing and clothing appropriate for easy access to any Portacath or PICC line.   We strive to give you quality time with your provider. You may need to reschedule your appointment if you arrive late (15 or more minutes).  Arriving late affects you and other patients whose appointments are after yours.  Also, if you miss three or more appointments without notifying the office, you may be dismissed from the clinic at the provider's discretion.      For prescription refill requests, have your pharmacy contact our office and allow 72 hours for refills to be completed.    Today you received the following chemotherapy and/or immunotherapy agents: Keytruda and Alimta      To help prevent nausea and vomiting after your treatment, we encourage you to take your nausea medication as directed.  BELOW ARE SYMPTOMS THAT SHOULD BE REPORTED IMMEDIATELY: *FEVER GREATER THAN 100.4 F (38 C) OR HIGHER *CHILLS OR SWEATING *NAUSEA AND VOMITING THAT IS NOT CONTROLLED WITH YOUR NAUSEA MEDICATION *UNUSUAL SHORTNESS OF BREATH *UNUSUAL BRUISING OR BLEEDING *URINARY PROBLEMS (pain or burning when urinating, or frequent urination) *BOWEL PROBLEMS (unusual diarrhea, constipation, pain near the anus) TENDERNESS IN MOUTH AND THROAT WITH OR WITHOUT PRESENCE OF ULCERS (sore throat, sores in mouth, or a toothache) UNUSUAL RASH, SWELLING OR PAIN  UNUSUAL VAGINAL DISCHARGE OR ITCHING   Items with * indicate a potential emergency and should be followed up as soon as possible or go to the Emergency Department if any problems should occur.  Please show the CHEMOTHERAPY ALERT CARD or IMMUNOTHERAPY ALERT  CARD at check-in to the Emergency Department and triage nurse.  Should you have questions after your visit or need to cancel or reschedule your appointment, please contact [facility]  Dept: [phone]  and follow the prompts.  Office hours are 8:00 a.m. to 4:30 p.m. Monday - Friday. Please note that voicemails left after 4:00 p.m. may not be returned until the following business day.  We are closed weekends and major holidays. You have access to a nurse at all times for urgent questions. Please call the main number to the clinic Dept: [phone] and follow the prompts.   For any non-urgent questions, you may also contact your provider using MyChart. We now offer e-Visits for anyone 18 and older to request care online for non-urgent symptoms. For details visit [email or web address].   Also download the MyChart app! Go to the app store, search "MyChart", open the app, select [facility], and log in with your MyChart username and password.   

## 2023-04-05 ENCOUNTER — Other Ambulatory Visit: Payer: Self-pay | Admitting: Internal Medicine

## 2023-04-05 ENCOUNTER — Telehealth: Payer: Self-pay | Admitting: Medical Oncology

## 2023-04-05 ENCOUNTER — Telehealth: Payer: Self-pay | Admitting: *Deleted

## 2023-04-05 LAB — T4: T4, Total: 10.9 ug/dL (ref 4.5–12.0)

## 2023-04-05 MED ORDER — AZITHROMYCIN 250 MG PO TABS: ORAL_TABLET | ORAL | 0 refills | Status: DC

## 2023-04-05 NOTE — Progress Notes (Signed)
  Care Coordination  Outreach Note  04/05/2023 Name: Jill [name] MRN: [id number] DOB: [birth year]   Care Coordination Outreach Attempts: An unsuccessful telephone outreach was attempted today to offer the patient information about available care coordination services.  Follow Up Plan:  Additional outreach attempts will be made to offer the patient care coordination information and services.   Encounter Outcome:  No Answer  Christie Nottingham  Care Coordination Care Guide  Direct Dial: [phone]

## 2023-04-05 NOTE — Telephone Encounter (Signed)
I told pt to pick up z-pack later today .

## 2023-04-05 NOTE — Progress Notes (Signed)
  Care Coordination   Note   04/05/2023 Name: Jill [name] MRN: [id number] DOB: 01-17-1951  Jill [name] is a 72 y.o. year old female who sees [name], MD for primary care. I reached out to [name] by phone today to offer care coordination services.  [name] was given information about Care Coordination services today including:   The Care Coordination services include support from the care team which includes your Nurse Coordinator, Clinical Social Worker, or Pharmacist.  The Care Coordination team is here to help remove barriers to the health concerns and goals most important to you. Care Coordination services are voluntary, and the patient may decline or stop services at any time by request to their care team member.   Care Coordination Consent Status: Patient agreed to services and verbal consent obtained.   Follow up plan:  Telephone appointment with care coordination team member scheduled for:  04/06/23  Encounter Outcome:  Pt. Scheduled  [facility] Coordination Care Guide  Direct Dial: [phone]

## 2023-04-06 ENCOUNTER — Telehealth: Payer: Self-pay

## 2023-04-06 NOTE — Patient Outreach (Signed)
  Care Coordination   04/06/2023 Name: GEORGI HAKIMIAN MRN: [id number] DOB: Mar 11, 1951   Care Coordination Outreach Attempts:  An unsuccessful telephone outreach was attempted today to offer the patient information about available care coordination services.  Follow Up Plan:  Additional outreach attempts will be made to offer the patient care coordination information and services.   Encounter Outcome:  No Answer   Care Coordination Interventions:  No, not indicated    Juanell Fairly RN, BSN, [facility] Care Coordinator [facility]   Phone: [phone]

## 2023-04-10 ENCOUNTER — Other Ambulatory Visit: Payer: Self-pay

## 2023-04-11 ENCOUNTER — Other Ambulatory Visit: Payer: Self-pay | Admitting: Internal Medicine

## 2023-04-11 ENCOUNTER — Telehealth: Payer: Self-pay | Admitting: Medical Oncology

## 2023-04-11 DIAGNOSIS — C3412 Malignant neoplasm of upper lobe, left bronchus or lung: Secondary | ICD-10-CM

## 2023-04-11 NOTE — Telephone Encounter (Signed)
Pt stated antibiotic produced a lot of thick phlegm especially with activity. Finished antibiotic Monday .She is still producing a lot of phlegm. She  requested a "thoracentesis next week". I told her to call back Friday with an update. I instructed pt to get Mucinex and drink lots of water.

## 2023-04-13 ENCOUNTER — Telehealth: Payer: Self-pay | Admitting: Medical Oncology

## 2023-04-13 NOTE — Telephone Encounter (Signed)
LVM of appt date for thoracentesis.

## 2023-04-15 DIAGNOSIS — J441 Chronic obstructive pulmonary disease with (acute) exacerbation: Secondary | ICD-10-CM | POA: Diagnosis not present

## 2023-04-16 ENCOUNTER — Other Ambulatory Visit: Payer: Self-pay | Admitting: Radiology

## 2023-04-17 ENCOUNTER — Ambulatory Visit (HOSPITAL_COMMUNITY)
Admission: RE | Admit: 2023-04-17 | Discharge: 2023-04-17 | Disposition: A | Discharge status: Home / Self Care | Payer: Medicare HMO | Source: Ambulatory Visit | Attending: Internal Medicine | Admitting: Internal Medicine

## 2023-04-17 ENCOUNTER — Ambulatory Visit (HOSPITAL_COMMUNITY)
Admission: RE | Admit: 2023-04-17 | Discharge: 2023-04-17 | Disposition: A | Discharge status: Home / Self Care | Payer: Medicare HMO | Source: Ambulatory Visit | Attending: Radiology | Admitting: Radiology

## 2023-04-17 VITALS — BP 109/74

## 2023-04-17 DIAGNOSIS — C3490 Malignant neoplasm of unspecified part of unspecified bronchus or lung: Secondary | ICD-10-CM | POA: Diagnosis not present

## 2023-04-17 DIAGNOSIS — J90 Pleural effusion, not elsewhere classified: Secondary | ICD-10-CM | POA: Diagnosis not present

## 2023-04-17 DIAGNOSIS — C7931 Secondary malignant neoplasm of brain: Secondary | ICD-10-CM | POA: Insufficient documentation

## 2023-04-17 DIAGNOSIS — R0600 Dyspnea, unspecified: Secondary | ICD-10-CM | POA: Diagnosis not present

## 2023-04-17 DIAGNOSIS — C3412 Malignant neoplasm of upper lobe, left bronchus or lung: Secondary | ICD-10-CM | POA: Insufficient documentation

## 2023-04-17 MED ORDER — LIDOCAINE HCL 1 % IJ SOLN
INTRAMUSCULAR | Status: AC
  Filled 2023-04-17: qty 20

## 2023-04-17 NOTE — Procedures (Signed)
Ultrasound-guided therapeutic left thoracentesis performed yielding 1.1 liters of yellow  fluid. No immediate complications. Follow-up chest x-ray pending. EBL none.

## 2023-04-19 ENCOUNTER — Telehealth: Payer: Self-pay | Admitting: *Deleted

## 2023-04-19 NOTE — Progress Notes (Signed)
  Care Coordination Note  04/19/2023 Name: Jill [name] MRN: [id number] DOB: [birth year]  Jill [name] is a 72 y.o. year old female who is a primary care patient of [name], MD and is actively engaged with the care management team. I reached out to [name] by phone today to assist with re-scheduling an initial visit with the RN Case Manager  Follow up plan: Telephone appointment with care management team member scheduled for:04/20/23  [facility] Coordination Care Guide  Direct Dial: [phone]

## 2023-04-19 NOTE — Progress Notes (Signed)
  Care Coordination Note  04/19/2023 Name: Jill [name] MRN: [id number] DOB: [birth year]  Jill [name] is a 71 y.o. year old female who is a primary care patient of [name], MD and is actively engaged with the care management team. I reached out to [name] by phone today to assist with re-scheduling an initial visit with the RN Case Manager  Follow up plan: Unsuccessful telephone outreach attempt made. A HIPAA compliant phone message was left for the patient providing contact information and requesting a return call.   [facility]  Care Coordination Care Guide  Direct Dial: [phone]

## 2023-04-20 ENCOUNTER — Telehealth: Payer: Self-pay

## 2023-04-20 NOTE — Patient Outreach (Signed)
  Care Coordination   04/20/2023 Name: Jill [name] MRN: [id number] DOB: 01-21-1951   Care Coordination Outreach Attempts:  A second unsuccessful outreach was attempted today to offer the patient with information about available care coordination services.  Follow Up Plan:  Additional outreach attempts will be made to offer the patient care coordination information and services.   Encounter Outcome:  No Answer   Care Coordination Interventions:  No, not indicated    Juanell Fairly RN, BSN, [facility] Care Coordinator [facility]   Phone: [phone]

## 2023-04-24 ENCOUNTER — Encounter: Payer: Self-pay | Admitting: Medical Oncology

## 2023-04-24 ENCOUNTER — Inpatient Hospital Stay: Payer: Medicare HMO

## 2023-04-24 ENCOUNTER — Inpatient Hospital Stay: Payer: Medicare HMO | Attending: Internal Medicine

## 2023-04-24 ENCOUNTER — Other Ambulatory Visit: Payer: Self-pay

## 2023-04-24 ENCOUNTER — Inpatient Hospital Stay (HOSPITAL_BASED_OUTPATIENT_CLINIC_OR_DEPARTMENT_OTHER): Payer: Medicare HMO | Admitting: Internal Medicine

## 2023-04-24 ENCOUNTER — Other Ambulatory Visit: Payer: Medicare HMO

## 2023-04-24 VITALS — BP 121/63 | HR 79 | Temp 97.4°F | Resp 14 | Wt 140.3 lb

## 2023-04-24 DIAGNOSIS — Z923 Personal history of irradiation: Secondary | ICD-10-CM | POA: Diagnosis not present

## 2023-04-24 DIAGNOSIS — C3412 Malignant neoplasm of upper lobe, left bronchus or lung: Secondary | ICD-10-CM | POA: Diagnosis not present

## 2023-04-24 DIAGNOSIS — Z8719 Personal history of other diseases of the digestive system: Secondary | ICD-10-CM | POA: Diagnosis not present

## 2023-04-24 DIAGNOSIS — Z882 Allergy status to sulfonamides status: Secondary | ICD-10-CM | POA: Diagnosis not present

## 2023-04-24 DIAGNOSIS — Z79631 Long term (current) use of antimetabolite agent: Secondary | ICD-10-CM | POA: Diagnosis not present

## 2023-04-24 DIAGNOSIS — Z5111 Encounter for antineoplastic chemotherapy: Secondary | ICD-10-CM | POA: Diagnosis present

## 2023-04-24 DIAGNOSIS — Z79899 Other long term (current) drug therapy: Secondary | ICD-10-CM | POA: Diagnosis not present

## 2023-04-24 DIAGNOSIS — Z87440 Personal history of urinary (tract) infections: Secondary | ICD-10-CM | POA: Diagnosis not present

## 2023-04-24 DIAGNOSIS — J90 Pleural effusion, not elsewhere classified: Secondary | ICD-10-CM | POA: Diagnosis not present

## 2023-04-24 DIAGNOSIS — Z9221 Personal history of antineoplastic chemotherapy: Secondary | ICD-10-CM | POA: Diagnosis not present

## 2023-04-24 DIAGNOSIS — C7931 Secondary malignant neoplasm of brain: Secondary | ICD-10-CM | POA: Diagnosis not present

## 2023-04-24 DIAGNOSIS — J449 Chronic obstructive pulmonary disease, unspecified: Secondary | ICD-10-CM | POA: Diagnosis not present

## 2023-04-24 DIAGNOSIS — E039 Hypothyroidism, unspecified: Secondary | ICD-10-CM | POA: Insufficient documentation

## 2023-04-24 DIAGNOSIS — Z880 Allergy status to penicillin: Secondary | ICD-10-CM | POA: Diagnosis not present

## 2023-04-24 DIAGNOSIS — I252 Old myocardial infarction: Secondary | ICD-10-CM | POA: Diagnosis not present

## 2023-04-24 DIAGNOSIS — Z79620 Long term (current) use of immunosuppressive biologic: Secondary | ICD-10-CM | POA: Insufficient documentation

## 2023-04-24 DIAGNOSIS — Z95828 Presence of other vascular implants and grafts: Secondary | ICD-10-CM

## 2023-04-24 DIAGNOSIS — Z79890 Hormone replacement therapy (postmenopausal): Secondary | ICD-10-CM | POA: Insufficient documentation

## 2023-04-24 DIAGNOSIS — D649 Anemia, unspecified: Secondary | ICD-10-CM | POA: Insufficient documentation

## 2023-04-24 DIAGNOSIS — C3490 Malignant neoplasm of unspecified part of unspecified bronchus or lung: Secondary | ICD-10-CM

## 2023-04-24 DIAGNOSIS — Z5112 Encounter for antineoplastic immunotherapy: Secondary | ICD-10-CM | POA: Insufficient documentation

## 2023-04-24 DIAGNOSIS — Z888 Allergy status to other drugs, medicaments and biological substances status: Secondary | ICD-10-CM | POA: Diagnosis not present

## 2023-04-24 LAB — CMP (CANCER CENTER ONLY)
ALT: 12 U/L (ref 0–44)
AST: 22 U/L (ref 15–41)
Albumin: 3.2 g/dL — ABNORMAL LOW (ref 3.5–5.0)
Alkaline Phosphatase: 72 U/L (ref 38–126)
Anion gap: 5 (ref 5–15)
BUN: 12 mg/dL (ref 8–23)
CO2: 31 mmol/L (ref 22–32)
Calcium: 9.2 mg/dL (ref 8.9–10.3)
Chloride: 103 mmol/L (ref 98–111)
Creatinine: 0.96 mg/dL (ref 0.44–1.00)
GFR, Estimated: >60 mL/min (ref >60)
Glucose, Bld: 109 mg/dL — ABNORMAL HIGH (ref 70–99)
Potassium: 4.1 mmol/L (ref 3.5–5.1)
Sodium: 139 mmol/L (ref 135–145)
Total Bilirubin: 0.2 mg/dL — ABNORMAL LOW (ref 0.3–1.2)
Total Protein: 6.8 g/dL (ref 6.5–8.1)

## 2023-04-24 LAB — CBC WITH DIFFERENTIAL (CANCER CENTER ONLY)
Abs Immature Granulocytes: 0.03 10*3/uL (ref 0.00–0.07)
Basophils Absolute: 0.1 10*3/uL (ref 0.0–0.1)
Basophils Relative: 1 %
Eosinophils Absolute: 0.1 10*3/uL (ref 0.0–0.5)
Eosinophils Relative: 2 %
HCT: 32.9 % — ABNORMAL LOW (ref 36.0–46.0)
Hemoglobin: 10.5 g/dL — ABNORMAL LOW (ref 12.0–15.0)
Immature Granulocytes: 1 %
Lymphocytes Relative: 18 %
Lymphs Abs: 1.2 10*3/uL (ref 0.7–4.0)
MCH: 34 pg (ref 26.0–34.0)
MCHC: 31.9 g/dL (ref 30.0–36.0)
MCV: 106.5 fL — ABNORMAL HIGH (ref 80.0–100.0)
Monocytes Absolute: 0.7 10*3/uL (ref 0.1–1.0)
Monocytes Relative: 11 %
Neutro Abs: 4.4 10*3/uL (ref 1.7–7.7)
Neutrophils Relative %: 67 %
Platelet Count: 590 10*3/uL — ABNORMAL HIGH (ref 150–400)
RBC: 3.09 MIL/uL — ABNORMAL LOW (ref 3.87–5.11)
RDW: 14.5 % (ref 11.5–15.5)
WBC Count: 6.6 10*3/uL (ref 4.0–10.5)
nRBC: 0 % (ref 0.0–0.2)

## 2023-04-24 MED ORDER — SODIUM CHLORIDE 0.9 % IV SOLN
200.0000 mg | Freq: Once | INTRAVENOUS | Status: AC
  Administered 2023-04-24: 200 mg via INTRAVENOUS
  Filled 2023-04-24: qty 200

## 2023-04-24 MED ORDER — PROCHLORPERAZINE MALEATE 10 MG PO TABS
10.0000 mg | ORAL_TABLET | Freq: Once | ORAL | Status: AC
  Administered 2023-04-24: 10 mg via ORAL
  Filled 2023-04-24: qty 1

## 2023-04-24 MED ORDER — SODIUM CHLORIDE 0.9 % IV SOLN: Freq: Once | INTRAVENOUS | Status: AC

## 2023-04-24 MED ORDER — SODIUM CHLORIDE 0.9 % IV SOLN
500.0000 mg/m2 | Freq: Once | INTRAVENOUS | Status: AC
  Administered 2023-04-24: 900 mg via INTRAVENOUS
  Filled 2023-04-24: qty 20

## 2023-04-24 MED ORDER — SODIUM CHLORIDE 0.9% FLUSH
10.0000 mL | Freq: Once | INTRAVENOUS | Status: AC
  Administered 2023-04-24: 10 mL

## 2023-04-24 NOTE — Progress Notes (Signed)
Patient seen by Dr. Gypsy Balsam are within treatment parameters.  Labs reviewed: and are within treatment parameters.  Per physician team, patient is ready for treatment and there are NO modifications to the treatment plan. Pembrolizumab, Pemetrexed

## 2023-04-24 NOTE — Progress Notes (Signed)
[facility] [phone]   [phone]  OFFICE PROGRESS NOTE  [name], MD [address] [place] [address]  DIAGNOSIS: Stage IV (T2 a, N2, M1b) non-small cell lung cancer, adenocarcinoma presented with left upper lobe lung mass in addition to left hilar and precarinal metastatic adenopathy and small left lower lobe pulmonary nodule in addition to solitary left frontal brain metastasis diagnosed in July 2022.  Molecular studies by [employer] showed  Positive KRAS G12C mutation PD-L1 expression was less than 1%   PRIOR THERAPY:  1) Status post SRS to the solitary brain metastasis. 20 Treatment for the locally advanced disease in the chest with carboplatin for AUC of 2 and paclitaxel 45 Mg/M2.  Status post 6 cycles.  Last dose was given 07/25/2021.  CURRENT THERAPY: Systemic chemotherapy with carboplatin for AUC of 5, Alimta 500 Mg/M2 and Keytruda 200 Mg IV every 3 weeks.  First dose August 31, 2021.  Status post 27 cycles.  Starting from cycle #5 the patient will be on maintenance treatment with Alimta and Keytruda every 3 weeks.  INTERVAL HISTORY: Jill [name] 72 y.o. female turns to the clinic today for follow-up visit.  The patient is feeling much better today after she underwent ultrasound-guided left thoracentesis with drainage of 1.1 L of pleural fluid.  She denied having any current chest pain, shortness of breath but has cough with no hemoptysis.  She has no nausea, vomiting, diarrhea or constipation.  She has no headache or visual changes.  She has no recent weight loss or night sweats.  She is here today for evaluation before starting cycle #28 of her treatment.  MEDICAL HISTORY: Past Medical History:  Diagnosis Date   Anxiety    ON PAXIL, XANAX   AVM (arteriovenous malformation) brain    s/p stent/coil   Blood transfusion    x 2   Cancer (HCC)    Left lung   COPD (chronic obstructive pulmonary disease) (HCC)     no inhaler, no oxygen   Coronary artery disease    Prior inferior MI with stent to RCA, s/p CABG in 2008   Depression    Dizziness    Dyspnea    with exertion, no oxygen   Fatigue    Foot injury 06/01/2017   right - RESOLVED, no longer an issue per patient 05/18/21   GERD (gastroesophageal reflux disease)    Headache(784.0)    UNRUPTURED CEREBRAL ANEURYSM   Hyperlipidemia    Hypertension    Hypothyroidism    Infected cyst of skin 09/18/2013   Left knee pain 06/05/2012   Lung mass    Left lung   Myocardial infarction (HCC)    Normal nuclear stress test Ju;y 2012   No ischemia. EF 70%; fixed defect involving septum, inferoseptal and inferior wall   Pain, dental 02/06/2017   Poor dental hygiene    Retroperitoneal bleeding    Following cardiac cath   Tobacco abuse    Urine discoloration 09/18/2013   Urine incontinence 07/06/2020   UTI (lower urinary tract infection) 10/16/2013    ALLERGIES:  is allergic to varenicline, ace inhibitors, prednisone, betalin 12 [vitamin b12], penicillins, and sulfa drugs cross reactors.  MEDICATIONS:  Current Outpatient Medications  Medication Sig Dispense Refill   azithromycin (ZITHROMAX) 250 MG tablet Use as instructed 6 each 0   acetaminophen (TYLENOL) 500 MG tablet Take 500 mg by mouth every 6 (six) hours as needed.  albuterol (PROVENTIL) (2.5 MG/3ML) 0.083% nebulizer solution Take 3 mLs (2.5 mg total) by nebulization every 6 (six) hours as needed for wheezing or shortness of breath. 75 mL 12   albuterol (VENTOLIN HFA) 108 (90 Base) MCG/ACT inhaler Inhale 2 puffs into the lungs every 6 (six) hours as needed for wheezing or shortness of breath. 8 g 2   amLODipine (NORVASC) 10 MG tablet TAKE 1 TABLET BY MOUTH ONCE DAILY . APPOINTMENT REQUIRED FOR FUTURE REFILLS 90 tablet 1   aspirin EC 81 MG tablet Take 1 tablet (81 mg total) by mouth daily. 90 tablet 2   atorvastatin (LIPITOR) 40 MG tablet Take 1 tablet (40 mg total) by mouth daily. 90  tablet 1   Cholecalciferol (VITAMIN D3) 250 MCG (10000 UT) capsule Take 10,000 mcg by mouth daily.     diphenhydrAMINE HCl (BENADRYL ALLERGY PO) Take 25 mg by mouth at bedtime as needed (for sleep).     folic acid (FOLVITE) 1 MG tablet Take 1 tablet (1 mg total) by mouth daily. 90 tablet 0   hydrocortisone cream 0.5 % Apply 1 Application topically 2 (two) times daily. As needed for itching. 30 g 0   levothyroxine (SYNTHROID) 150 MCG tablet TAKE 1 TABLET BY MOUTH DAILY BEFORE BREAKFAST. 90 tablet 0   lidocaine-prilocaine (EMLA) cream Apply 1 Application topically as needed. 30 g 2   losartan (COZAAR) 100 MG tablet Take 1 tablet (100 mg total) by mouth daily. 90 tablet 1   metoprolol succinate (TOPROL-XL) 50 MG 24 hr tablet TAKE 1 TABLET BY MOUTH ONCE DAILY WITH MEALS OR  IMMEDIATLEY  AFTER  A  MEAL 90 tablet 1   mirtazapine (REMERON) 15 MG tablet TAKE 1 TABLET BY MOUTH EVERYDAY AT BEDTIME 90 tablet 1   Multiple Vitamins-Minerals (MULTIVITAMIN WITH MINERALS) tablet Take 1 tablet by mouth daily.     nitroGLYCERIN (NITROSTAT) 0.4 MG SL tablet Place 1 tablet (0.4 mg total) under the tongue every 5 (five) minutes as needed. For chest pain 25 tablet 6   prochlorperazine (COMPAZINE) 10 MG tablet TAKE 1 TABLET BY MOUTH EVERY 6 HOURS AS NEEDED FOR NAUSEA FOR VOMITING 30 tablet 0   umeclidinium-vilanterol (ANORO ELLIPTA) 62.5-25 MCG/ACT AEPB Inhale 1 puff into the lungs daily. 60 each 1   Vitamin A 2400 MCG (8000 UT) TABS Take 2,400 mg by mouth daily.     No current facility-administered medications for this visit.    SURGICAL HISTORY:  Past Surgical History:  Procedure Laterality Date   CARDIAC CATHETERIZATION  01/02/2007   IT REVEALS MILD INFERIOR WALL HYPOKINESIS. THE EJECTION FRACTION IS AROUND 50%   COLONOSCOPY     CORONARY ARTERY BYPASS GRAFT  11/06/2006   LIMA to LAD, SVG to DX, SVG to LCX & SVG to OM 1 & 2, and SVG to PD   CORONARY STENT PLACEMENT     Remote past stent to RCA   EYE SURGERY  Right    cataracts removed   HEMORRHOID SURGERY  11/07/1987   IR IMAGING GUIDED PORT INSERTION  10/04/2022   UPPER GI ENDOSCOPY     VENTRICULOSTOMY  10/06/2011   Procedure: VENTRICULOSTOMY;  Surgeon: Carmela Hurt;  Location: [facility];  Service: Neurosurgery;  Laterality: Right;  Insertion of Ventriculostomy Catheter   VIDEO BRONCHOSCOPY WITH ENDOBRONCHIAL NAVIGATION Left 05/20/2021   Procedure: VIDEO BRONCHOSCOPY WITH ENDOBRONCHIAL NAVIGATION;  Surgeon: Josephine Igo, DO;  Location: [facility] OR;  Service: Pulmonary;  Laterality: Left;   VIDEO BRONCHOSCOPY WITH ENDOBRONCHIAL ULTRASOUND  Bilateral 05/20/2021   Procedure: VIDEO BRONCHOSCOPY WITH ENDOBRONCHIAL ULTRASOUND;  Surgeon: Josephine Igo, DO;  Location: [facility] OR;  Service: Pulmonary;  Laterality: Bilateral;   WISDOM TOOTH EXTRACTION      REVIEW OF SYSTEMS:  A comprehensive review of systems was negative except for: Respiratory: positive for cough   PHYSICAL EXAMINATION: General appearance: alert, cooperative, and no distress Head: Normocephalic, without obvious abnormality, atraumatic Neck: no adenopathy, no JVD, supple, symmetrical, trachea midline, and thyroid not enlarged, symmetric, no tenderness/mass/nodules Lymph nodes: Cervical, supraclavicular, and axillary nodes normal. Resp: clear to auscultation bilaterally Back: symmetric, no curvature. ROM normal. No CVA tenderness. Cardio: regular rate and rhythm, S1, S2 normal, no murmur, click, rub or gallop GI: soft, non-tender; bowel sounds normal; no masses,  no organomegaly Extremities: extremities normal, atraumatic, no cyanosis or edema  ECOG PERFORMANCE STATUS: 1 - Symptomatic but completely ambulatory  Blood pressure 121/63, pulse 79, temperature (!) 97.4 F (36.3 C), temperature source Oral, resp. rate 14, weight 140 lb 4.8 oz (63.6 kg), SpO2 92 %.  LABORATORY DATA: Lab Results  Component Value Date   WBC 6.8 04/04/2023   HGB 11.3 (L) 04/04/2023   HCT 34.6 (L)  04/04/2023   MCV 105.5 (H) 04/04/2023   PLT 439 (H) 04/04/2023      Chemistry      Component Value Date/Time   NA 140 04/04/2023 0831   NA 141 04/12/2021 1356   K 4.0 04/04/2023 0831   CL 105 04/04/2023 0831   CO2 30 04/04/2023 0831   BUN 13 04/04/2023 0831   BUN 13 04/12/2021 1356   CREATININE 1.01 (H) 04/04/2023 0831   CREATININE 0.99 08/29/2016 1130      Component Value Date/Time   CALCIUM 9.0 04/04/2023 0831   ALKPHOS 82 04/04/2023 0831   AST 22 04/04/2023 0831   ALT 11 04/04/2023 0831   BILITOT 0.3 04/04/2023 0831       RADIOGRAPHIC STUDIES: US THORACENTESIS ASP PLEURAL SPACE W/IMG GUIDE  Result Date: 04/17/2023 INDICATION: Patient with history of stage IV lung cancer, dyspnea, left pleural effusion. Request received for therapeutic left thoracentesis EXAM: ULTRASOUND GUIDED THERAPEUTIC LEFT THORACENTESIS MEDICATIONS: 8 mL 1% lidocaine COMPLICATIONS: None immediate. PROCEDURE: An ultrasound guided thoracentesis was thoroughly discussed with the patient and questions answered. The benefits, risks, alternatives and complications were also discussed. The patient understands and wishes to proceed with the procedure. Written consent was obtained. Ultrasound was performed to localize and mark an adequate pocket of fluid in the left chest. The area was then prepped and draped in the normal sterile fashion. 1% Lidocaine was used for local anesthesia. Under ultrasound guidance a 6 Fr Safe-T-Centesis catheter was introduced. Thoracentesis was performed. The catheter was removed and a dressing applied. FINDINGS: A total of approximately 1.1 liters of yellow fluid was removed. IMPRESSION: Successful ultrasound guided therapeutic LEFT thoracentesis yielding 1.1 liters of pleural fluid. Performed by: Jeananne Rama, PA-C Electronically Signed   By: Roanna Banning M.D.   On: 04/17/2023 13:40   DG Chest 1 View  Result Date: 04/17/2023 CLINICAL DATA:  [id number] S/P thoracentesis [id number] EXAM: PORTABLE  CHEST 1 VIEW COMPARISON:  IR thoracentesis, earlier same day. Chest XR, 05/20/2021. CT chest, 03/05/2023. FINDINGS: Support lines: RIGHT IJ-approach chest port with catheter tip well-positioned at the superior cavoatrial junction. The LEFT cardiac border is obscured. Coronary bypass changes. The RIGHT lung is clear. LEFT perihilar and LEFT basilar opacities, with small volume residual LEFT pleural effusion. No pneumothorax. Median sternotomy changes. No acute  osseous abnormality. IMPRESSION: 1. No pneumothorax post LEFT thoracentesis. Small volume residual pleural effusion. 2. LEFT perihilar and basilar opacities, favored to represent atelectasis. Electronically Signed   By: Roanna Banning M.D.   On: 04/17/2023 12:48    ASSESSMENT AND PLAN: This is a very pleasant 72 years old white female diagnosed with a stage IV (T2 a, N2, M1 B) non-small cell lung cancer, adenocarcinoma presented with left upper lobe lung mass in addition to left hilar and precarinal metastatic adenopathy as well as small left lower lobe pulmonary nodule and solitary left frontal brain metastasis diagnosed in July 2022.  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 The patient is status post SRS to the solitary brain metastasis. She underwent a course of concurrent chemoradiation to the locally advanced disease in the chest with carboplatin for AUC of 2 and paclitaxel 45 Mg/M2 status post 7 cycles.  The patient has been tolerating her treatment well with no concerning adverse effects. Her scan showed mild improvement of her disease with no concerning findings for progression. Technically the patient has a stage IV lung cancer with brain metastasis  The patient is currently undergoing systemic chemotherapy with carboplatin for AUC of 5, Alimta 500 Mg/M2 and Keytruda 200 Mg IV every 3 weeks status post 27 cycles.  Starting from cycle #5 the patient will be on maintenance treatment with Alimta and  Keytruda every 3 weeks.   She has been tolerating this treatment well with no concerning adverse effects except for mild fatigue. I recommended for her to proceed with cycle #28 today as planned. I will see her back for follow-up visit in 3 weeks for evaluation with repeat CT scan of the chest, abdomen and pelvis for restaging of her disease. For the anemia, I advised her to take oral iron tablet every other day with vitamin C. The patient was advised to call immediately if she has any other concerning symptoms in the interval. The patient voices understanding of current disease status and treatment options and is in agreement with the current care plan. The total time spent in the appointment was 20 minutes.  All questions were answered. The patient knows to call the clinic with any problems, questions or concerns. We can certainly see the patient much sooner if necessary.  Disclaimer: This note was dictated with voice recognition software. Similar sounding words can inadvertently be transcribed and may not be corrected upon review.

## 2023-05-01 ENCOUNTER — Telehealth: Payer: Self-pay | Admitting: *Deleted

## 2023-05-01 ENCOUNTER — Other Ambulatory Visit: Payer: Self-pay

## 2023-05-01 NOTE — Progress Notes (Signed)
  Care Coordination Note  05/01/2023 Name: [name] MRN: [id number] DOB: 23-Aug-1951  Jill [name] is a 72 y.o. year old female who is a primary care patient of [name], MD and is actively engaged with the care management team. I reached out to [name] by phone today to assist with re-scheduling an initial visit with the RN Case Manager  Follow up plan: Unsuccessful telephone outreach attempt made. A HIPAA compliant phone message was left for the patient providing contact information and requesting a return call.  [facility]  Care Coordination Care Guide  Direct Dial: [phone]

## 2023-05-02 ENCOUNTER — Other Ambulatory Visit: Payer: Self-pay

## 2023-05-07 ENCOUNTER — Telehealth: Payer: Self-pay | Admitting: Internal Medicine

## 2023-05-07 NOTE — Telephone Encounter (Signed)
Called patient regarding July/August appointments, patient is notified.

## 2023-05-07 NOTE — Progress Notes (Signed)
  Care Coordination Note  05/07/2023 Name: Jill [name] MRN: [id number] DOB: [birth year]  Jill [name] is a 72 y.o. year old female who is a primary care patient of [name], MD and is actively engaged with the care management team. I reached out to [name] by phone today to assist with re-scheduling an initial visit with the RN Case Manager  Follow up plan: Unsuccessful telephone outreach attempt made. A HIPAA compliant phone message was left for the patient providing contact information and requesting a return call.  We have been unable to make contact with the patient for follow up. The care management team is available to follow up with the patient after provider conversation with the patient regarding recommendation for care management engagement and subsequent re-referral to the care management team.   [facility] Coordination Care Guide  Direct Dial: [phone]

## 2023-05-11 ENCOUNTER — Ambulatory Visit (HOSPITAL_COMMUNITY)
Admission: RE | Admit: 2023-05-11 | Discharge: 2023-05-11 | Disposition: A | Discharge status: Home / Self Care | Payer: Medicare HMO | Source: Ambulatory Visit | Attending: Internal Medicine | Admitting: Internal Medicine

## 2023-05-11 DIAGNOSIS — C3490 Malignant neoplasm of unspecified part of unspecified bronchus or lung: Secondary | ICD-10-CM | POA: Diagnosis not present

## 2023-05-11 DIAGNOSIS — I700 Atherosclerosis of aorta: Secondary | ICD-10-CM | POA: Diagnosis not present

## 2023-05-11 DIAGNOSIS — K8020 Calculus of gallbladder without cholecystitis without obstruction: Secondary | ICD-10-CM | POA: Diagnosis not present

## 2023-05-11 DIAGNOSIS — J90 Pleural effusion, not elsewhere classified: Secondary | ICD-10-CM | POA: Diagnosis not present

## 2023-05-11 MED ORDER — IOHEXOL 300 MG/ML  SOLN
100.0000 mL | Freq: Once | INTRAMUSCULAR | Status: AC | PRN
  Administered 2023-05-11: 100 mL via INTRAVENOUS

## 2023-05-11 MED ORDER — SODIUM CHLORIDE (PF) 0.9 % IJ SOLN
INTRAMUSCULAR | Status: AC
  Filled 2023-05-11: qty 50

## 2023-05-15 ENCOUNTER — Ambulatory Visit
Admission: RE | Admit: 2023-05-15 | Discharge: 2023-05-15 | Disposition: A | Discharge status: Home / Self Care | Payer: Medicare HMO | Source: Ambulatory Visit | Attending: Radiation Oncology | Admitting: Radiation Oncology

## 2023-05-15 DIAGNOSIS — C7931 Secondary malignant neoplasm of brain: Secondary | ICD-10-CM | POA: Diagnosis not present

## 2023-05-15 DIAGNOSIS — J441 Chronic obstructive pulmonary disease with (acute) exacerbation: Secondary | ICD-10-CM | POA: Diagnosis not present

## 2023-05-15 DIAGNOSIS — C3490 Malignant neoplasm of unspecified part of unspecified bronchus or lung: Secondary | ICD-10-CM | POA: Diagnosis not present

## 2023-05-15 MED ORDER — GADOPICLENOL 0.5 MMOL/ML IV SOLN
7.0000 mL | Freq: Once | INTRAVENOUS | Status: AC | PRN
  Administered 2023-05-15: 7 mL via INTRAVENOUS

## 2023-05-16 ENCOUNTER — Inpatient Hospital Stay: Payer: Medicare HMO

## 2023-05-16 ENCOUNTER — Other Ambulatory Visit: Payer: Self-pay

## 2023-05-16 ENCOUNTER — Inpatient Hospital Stay: Payer: Medicare HMO | Attending: Internal Medicine

## 2023-05-16 ENCOUNTER — Telehealth: Payer: Self-pay

## 2023-05-16 ENCOUNTER — Inpatient Hospital Stay (HOSPITAL_BASED_OUTPATIENT_CLINIC_OR_DEPARTMENT_OTHER): Payer: Medicare HMO | Admitting: Internal Medicine

## 2023-05-16 VITALS — BP 129/66 | HR 87

## 2023-05-16 VITALS — BP 126/65 | HR 94 | Temp 97.5°F | Resp 17 | Ht 66.0 in | Wt 139.4 lb

## 2023-05-16 DIAGNOSIS — Z8719 Personal history of other diseases of the digestive system: Secondary | ICD-10-CM | POA: Insufficient documentation

## 2023-05-16 DIAGNOSIS — C3412 Malignant neoplasm of upper lobe, left bronchus or lung: Secondary | ICD-10-CM | POA: Insufficient documentation

## 2023-05-16 DIAGNOSIS — Z79631 Long term (current) use of antimetabolite agent: Secondary | ICD-10-CM | POA: Insufficient documentation

## 2023-05-16 DIAGNOSIS — J449 Chronic obstructive pulmonary disease, unspecified: Secondary | ICD-10-CM | POA: Diagnosis not present

## 2023-05-16 DIAGNOSIS — I252 Old myocardial infarction: Secondary | ICD-10-CM | POA: Diagnosis not present

## 2023-05-16 DIAGNOSIS — Z5111 Encounter for antineoplastic chemotherapy: Secondary | ICD-10-CM | POA: Diagnosis not present

## 2023-05-16 DIAGNOSIS — Z79620 Long term (current) use of immunosuppressive biologic: Secondary | ICD-10-CM | POA: Diagnosis not present

## 2023-05-16 DIAGNOSIS — Z79899 Other long term (current) drug therapy: Secondary | ICD-10-CM | POA: Diagnosis not present

## 2023-05-16 DIAGNOSIS — I700 Atherosclerosis of aorta: Secondary | ICD-10-CM | POA: Insufficient documentation

## 2023-05-16 DIAGNOSIS — N281 Cyst of kidney, acquired: Secondary | ICD-10-CM | POA: Diagnosis not present

## 2023-05-16 DIAGNOSIS — C7931 Secondary malignant neoplasm of brain: Secondary | ICD-10-CM | POA: Insufficient documentation

## 2023-05-16 DIAGNOSIS — Z9221 Personal history of antineoplastic chemotherapy: Secondary | ICD-10-CM | POA: Insufficient documentation

## 2023-05-16 DIAGNOSIS — E039 Hypothyroidism, unspecified: Secondary | ICD-10-CM | POA: Insufficient documentation

## 2023-05-16 DIAGNOSIS — J948 Other specified pleural conditions: Secondary | ICD-10-CM | POA: Insufficient documentation

## 2023-05-16 DIAGNOSIS — E785 Hyperlipidemia, unspecified: Secondary | ICD-10-CM | POA: Insufficient documentation

## 2023-05-16 DIAGNOSIS — J479 Bronchiectasis, uncomplicated: Secondary | ICD-10-CM | POA: Insufficient documentation

## 2023-05-16 DIAGNOSIS — Z87440 Personal history of urinary (tract) infections: Secondary | ICD-10-CM | POA: Insufficient documentation

## 2023-05-16 DIAGNOSIS — R110 Nausea: Secondary | ICD-10-CM | POA: Insufficient documentation

## 2023-05-16 DIAGNOSIS — Z5112 Encounter for antineoplastic immunotherapy: Secondary | ICD-10-CM | POA: Insufficient documentation

## 2023-05-16 DIAGNOSIS — Z882 Allergy status to sulfonamides status: Secondary | ICD-10-CM | POA: Insufficient documentation

## 2023-05-16 DIAGNOSIS — Z923 Personal history of irradiation: Secondary | ICD-10-CM | POA: Diagnosis not present

## 2023-05-16 DIAGNOSIS — C3490 Malignant neoplasm of unspecified part of unspecified bronchus or lung: Secondary | ICD-10-CM

## 2023-05-16 DIAGNOSIS — J90 Pleural effusion, not elsewhere classified: Secondary | ICD-10-CM | POA: Diagnosis not present

## 2023-05-16 DIAGNOSIS — D649 Anemia, unspecified: Secondary | ICD-10-CM | POA: Insufficient documentation

## 2023-05-16 DIAGNOSIS — Z880 Allergy status to penicillin: Secondary | ICD-10-CM | POA: Insufficient documentation

## 2023-05-16 DIAGNOSIS — Z888 Allergy status to other drugs, medicaments and biological substances status: Secondary | ICD-10-CM | POA: Insufficient documentation

## 2023-05-16 DIAGNOSIS — Z95828 Presence of other vascular implants and grafts: Secondary | ICD-10-CM

## 2023-05-16 LAB — CBC WITH DIFFERENTIAL (CANCER CENTER ONLY)
Abs Immature Granulocytes: 0.03 10*3/uL (ref 0.00–0.07)
Basophils Absolute: 0.1 10*3/uL (ref 0.0–0.1)
Basophils Relative: 1 %
Eosinophils Absolute: 0.1 10*3/uL (ref 0.0–0.5)
Eosinophils Relative: 1 %
HCT: 32.6 % — ABNORMAL LOW (ref 36.0–46.0)
Hemoglobin: 10.7 g/dL — ABNORMAL LOW (ref 12.0–15.0)
Immature Granulocytes: 0 %
Lymphocytes Relative: 14 %
Lymphs Abs: 1.2 10*3/uL (ref 0.7–4.0)
MCH: 34.4 pg — ABNORMAL HIGH (ref 26.0–34.0)
MCHC: 32.8 g/dL (ref 30.0–36.0)
MCV: 104.8 fL — ABNORMAL HIGH (ref 80.0–100.0)
Monocytes Absolute: 0.7 10*3/uL (ref 0.1–1.0)
Monocytes Relative: 9 %
Neutro Abs: 5.9 10*3/uL (ref 1.7–7.7)
Neutrophils Relative %: 75 %
Platelet Count: 490 10*3/uL — ABNORMAL HIGH (ref 150–400)
RBC: 3.11 MIL/uL — ABNORMAL LOW (ref 3.87–5.11)
RDW: 15.3 % (ref 11.5–15.5)
WBC Count: 8 10*3/uL (ref 4.0–10.5)
nRBC: 0 % (ref 0.0–0.2)

## 2023-05-16 LAB — CMP (CANCER CENTER ONLY)
ALT: 9 U/L (ref 0–44)
AST: 21 U/L (ref 15–41)
Albumin: 3.2 g/dL — ABNORMAL LOW (ref 3.5–5.0)
Alkaline Phosphatase: 83 U/L (ref 38–126)
Anion gap: 5 (ref 5–15)
BUN: 11 mg/dL (ref 8–23)
CO2: 30 mmol/L (ref 22–32)
Calcium: 9.1 mg/dL (ref 8.9–10.3)
Chloride: 103 mmol/L (ref 98–111)
Creatinine: 1.02 mg/dL — ABNORMAL HIGH (ref 0.44–1.00)
GFR, Estimated: 58 mL/min — ABNORMAL LOW (ref >60)
Glucose, Bld: 128 mg/dL — ABNORMAL HIGH (ref 70–99)
Potassium: 3.6 mmol/L (ref 3.5–5.1)
Sodium: 138 mmol/L (ref 135–145)
Total Bilirubin: 0.3 mg/dL (ref 0.3–1.2)
Total Protein: 7.4 g/dL (ref 6.5–8.1)

## 2023-05-16 MED ORDER — SODIUM CHLORIDE 0.9 % IV SOLN
200.0000 mg | Freq: Once | INTRAVENOUS | Status: AC
  Administered 2023-05-16: 200 mg via INTRAVENOUS
  Filled 2023-05-16: qty 200

## 2023-05-16 MED ORDER — PROCHLORPERAZINE MALEATE 10 MG PO TABS
10.0000 mg | ORAL_TABLET | Freq: Once | ORAL | Status: AC
  Administered 2023-05-16: 10 mg via ORAL
  Filled 2023-05-16: qty 1

## 2023-05-16 MED ORDER — SODIUM CHLORIDE 0.9% FLUSH
10.0000 mL | INTRAVENOUS | Status: DC | PRN
  Administered 2023-05-16: 10 mL

## 2023-05-16 MED ORDER — SODIUM CHLORIDE 0.9 % IV SOLN: Freq: Once | INTRAVENOUS | Status: AC

## 2023-05-16 MED ORDER — HEPARIN SOD (PORK) LOCK FLUSH 100 UNIT/ML IV SOLN
500.0000 [IU] | Freq: Once | INTRAVENOUS | Status: AC | PRN
  Administered 2023-05-16: 500 [IU]

## 2023-05-16 MED ORDER — SODIUM CHLORIDE 0.9 % IV SOLN
500.0000 mg/m2 | Freq: Once | INTRAVENOUS | Status: AC
  Administered 2023-05-16: 900 mg via INTRAVENOUS
  Filled 2023-05-16: qty 20

## 2023-05-16 MED ORDER — SODIUM CHLORIDE 0.9% FLUSH
10.0000 mL | Freq: Once | INTRAVENOUS | Status: AC
  Administered 2023-05-16: 10 mL

## 2023-05-16 NOTE — Patient Instructions (Signed)
[facility]  Discharge Instructions: Thank you for choosing [facility] to provide your oncology and hematology care.   If you have a lab appointment with the Cancer Center, please go directly to the Cancer Center and check in at the registration area.   Wear comfortable clothing and clothing appropriate for easy access to any Portacath or PICC line.   We strive to give you quality time with your provider. You may need to reschedule your appointment if you arrive late (15 or more minutes).  Arriving late affects you and other patients whose appointments are after yours.  Also, if you miss three or more appointments without notifying the office, you may be dismissed from the clinic at the provider's discretion.      For prescription refill requests, have your pharmacy contact our office and allow 72 hours for refills to be completed.    Today you received the following chemotherapy and/or immunotherapy agents: Pembrolizumab, Pemetrexed      To help prevent nausea and vomiting after your treatment, we encourage you to take your nausea medication as directed.  BELOW ARE SYMPTOMS THAT SHOULD BE REPORTED IMMEDIATELY: *FEVER GREATER THAN 100.4 F (38 C) OR HIGHER *CHILLS OR SWEATING *NAUSEA AND VOMITING THAT IS NOT CONTROLLED WITH YOUR NAUSEA MEDICATION *UNUSUAL SHORTNESS OF BREATH *UNUSUAL BRUISING OR BLEEDING *URINARY PROBLEMS (pain or burning when urinating, or frequent urination) *BOWEL PROBLEMS (unusual diarrhea, constipation, pain near the anus) TENDERNESS IN MOUTH AND THROAT WITH OR WITHOUT PRESENCE OF ULCERS (sore throat, sores in mouth, or a toothache) UNUSUAL RASH, SWELLING OR PAIN  UNUSUAL VAGINAL DISCHARGE OR ITCHING   Items with * indicate a potential emergency and should be followed up as soon as possible or go to the Emergency Department if any problems should occur.  Please show the CHEMOTHERAPY ALERT CARD or IMMUNOTHERAPY  ALERT CARD at check-in to the Emergency Department and triage nurse.  Should you have questions after your visit or need to cancel or reschedule your appointment, please contact [facility]  Dept: [phone]  and follow the prompts.  Office hours are 8:00 a.m. to 4:30 p.m. Monday - Friday. Please note that voicemails left after 4:00 p.m. may not be returned until the following business day.  We are closed weekends and major holidays. You have access to a nurse at all times for urgent questions. Please call the main number to the clinic Dept: [phone] and follow the prompts.   For any non-urgent questions, you may also contact your provider using MyChart. We now offer e-Visits for anyone 60 and older to request care online for non-urgent symptoms. For details visit [email or web address].   Also download the MyChart app! Go to the app store, search "MyChart", open the app, select [place], and log in with your MyChart username and password.

## 2023-05-16 NOTE — Telephone Encounter (Signed)
Pt called asking what the appt on 05/23/23 was for. I explained that it is a follow up from her 05/16/23 MRI. Also, explained that it is a telephone follow up and she does not need to come in for this appt. She asked what they were going to do for her "spot" found on the MRI. I explained that everything from the MRI would be discussed at follow-up.

## 2023-05-16 NOTE — Progress Notes (Signed)
[facility] [phone]   Fax:([phone]  OFFICE PROGRESS NOTE  [name], MD [address] [place] [address]  DIAGNOSIS: Stage IV (T2 a, N2, M1b) non-small cell lung cancer, adenocarcinoma presented with left upper lobe lung mass in addition to left hilar and precarinal metastatic adenopathy and small left lower lobe pulmonary nodule in addition to solitary left frontal brain metastasis diagnosed in July 2022.  Molecular studies by [employer] showed  Positive KRAS G12C mutation PD-L1 expression was less than 1%   PRIOR THERAPY:  1) Status post SRS to the solitary brain metastasis. 20 Treatment for the locally advanced disease in the chest with carboplatin for AUC of 2 and paclitaxel 45 Mg/M2.  Status post 6 cycles.  Last dose was given 07/25/2021.  CURRENT THERAPY: Systemic chemotherapy with carboplatin for AUC of 5, Alimta 500 Mg/M2 and Keytruda 200 Mg IV every 3 weeks.  First dose August 31, 2021.  Status post 27 cycles.  Starting from cycle #5 the patient will be on maintenance treatment with Alimta and Keytruda every 3 weeks.  INTERVAL HISTORY: Jill [name] 72 y.o. female returns to the clinic today for follow-up visit.  The patient has no complaints today except for fatigue and shortness of breath with exertion.  She underwent left-sided ultrasound-guided thoracentesis on 04/17/2023 with drainage of 1.1 L of fluids.  The patient has no current chest pain, cough or hemoptysis.  She has no nausea, vomiting, diarrhea or constipation.  She has no headache or visual changes.  She had repeat MRI of the brain as well as CT scan of the chest, abdomen and pelvis performed recently and she is here for evaluation and discussion of her scan results.  MEDICAL HISTORY: Past Medical History:  Diagnosis Date   Anxiety    ON PAXIL, XANAX   AVM (arteriovenous malformation) brain    s/p stent/coil   Blood transfusion    x 2   Cancer  (HCC)    Left lung   COPD (chronic obstructive pulmonary disease) (HCC)    no inhaler, no oxygen   Coronary artery disease    Prior inferior MI with stent to RCA, s/p CABG in 2008   Depression    Dizziness    Dyspnea    with exertion, no oxygen   Fatigue    Foot injury 06/01/2017   right - RESOLVED, no longer an issue per patient 05/18/21   GERD (gastroesophageal reflux disease)    Headache(784.0)    UNRUPTURED CEREBRAL ANEURYSM   Hyperlipidemia    Hypertension    Hypothyroidism    Infected cyst of skin 09/18/2013   Left knee pain 06/05/2012   Lung mass    Left lung   Myocardial infarction (HCC)    Normal nuclear stress test Ju;y 2012   No ischemia. EF 70%; fixed defect involving septum, inferoseptal and inferior wall   Pain, dental 02/06/2017   Poor dental hygiene    Retroperitoneal bleeding    Following cardiac cath   Tobacco abuse    Urine discoloration 09/18/2013   Urine incontinence 07/06/2020   UTI (lower urinary tract infection) 10/16/2013    ALLERGIES:  is allergic to varenicline, ace inhibitors, prednisone, betalin 12 [vitamin b12], penicillins, and sulfa drugs cross reactors.  MEDICATIONS:  Current Outpatient Medications  Medication Sig Dispense Refill   azithromycin (ZITHROMAX) 250 MG tablet Use as instructed 6 each 0   acetaminophen (TYLENOL) 500 MG tablet Take 500  mg by mouth every 6 (six) hours as needed.     albuterol (PROVENTIL) (2.5 MG/3ML) 0.083% nebulizer solution Take 3 mLs (2.5 mg total) by nebulization every 6 (six) hours as needed for wheezing or shortness of breath. 75 mL 12   albuterol (VENTOLIN HFA) 108 (90 Base) MCG/ACT inhaler Inhale 2 puffs into the lungs every 6 (six) hours as needed for wheezing or shortness of breath. 8 g 2   amLODipine (NORVASC) 10 MG tablet TAKE 1 TABLET BY MOUTH ONCE DAILY . APPOINTMENT REQUIRED FOR FUTURE REFILLS 90 tablet 1   aspirin EC 81 MG tablet Take 1 tablet (81 mg total) by mouth daily. 90 tablet 2   atorvastatin  (LIPITOR) 40 MG tablet Take 1 tablet (40 mg total) by mouth daily. 90 tablet 1   Cholecalciferol (VITAMIN D3) 250 MCG (10000 UT) capsule Take 10,000 mcg by mouth daily.     diphenhydrAMINE HCl (BENADRYL ALLERGY PO) Take 25 mg by mouth at bedtime as needed (for sleep).     folic acid (FOLVITE) 1 MG tablet Take 1 tablet (1 mg total) by mouth daily. 90 tablet 0   hydrocortisone cream 0.5 % Apply 1 Application topically 2 (two) times daily. As needed for itching. 30 g 0   levothyroxine (SYNTHROID) 150 MCG tablet TAKE 1 TABLET BY MOUTH DAILY BEFORE BREAKFAST. 90 tablet 0   lidocaine-prilocaine (EMLA) cream Apply 1 Application topically as needed. 30 g 2   losartan (COZAAR) 100 MG tablet Take 1 tablet (100 mg total) by mouth daily. 90 tablet 1   metoprolol succinate (TOPROL-XL) 50 MG 24 hr tablet TAKE 1 TABLET BY MOUTH ONCE DAILY WITH MEALS OR  IMMEDIATLEY  AFTER  A  MEAL 90 tablet 1   mirtazapine (REMERON) 15 MG tablet TAKE 1 TABLET BY MOUTH EVERYDAY AT BEDTIME 90 tablet 1   Multiple Vitamins-Minerals (MULTIVITAMIN WITH MINERALS) tablet Take 1 tablet by mouth daily.     nitroGLYCERIN (NITROSTAT) 0.4 MG SL tablet Place 1 tablet (0.4 mg total) under the tongue every 5 (five) minutes as needed. For chest pain 25 tablet 6   prochlorperazine (COMPAZINE) 10 MG tablet TAKE 1 TABLET BY MOUTH EVERY 6 HOURS AS NEEDED FOR NAUSEA FOR VOMITING 30 tablet 0   umeclidinium-vilanterol (ANORO ELLIPTA) 62.5-25 MCG/ACT AEPB Inhale 1 puff into the lungs daily. 60 each 1   Vitamin A 2400 MCG (8000 UT) TABS Take 2,400 mg by mouth daily.     No current facility-administered medications for this visit.    SURGICAL HISTORY:  Past Surgical History:  Procedure Laterality Date   CARDIAC CATHETERIZATION  01/02/2007   IT REVEALS MILD INFERIOR WALL HYPOKINESIS. THE EJECTION FRACTION IS AROUND 50%   COLONOSCOPY     CORONARY ARTERY BYPASS GRAFT  11/06/2006   LIMA to LAD, SVG to DX, SVG to LCX & SVG to OM 1 & 2, and SVG to PD    CORONARY STENT PLACEMENT     Remote past stent to RCA   EYE SURGERY Right    cataracts removed   HEMORRHOID SURGERY  11/07/1987   IR IMAGING GUIDED PORT INSERTION  10/04/2022   UPPER GI ENDOSCOPY     VENTRICULOSTOMY  10/06/2011   Procedure: VENTRICULOSTOMY;  Surgeon: Carmela Hurt;  Location: [facility];  Service: Neurosurgery;  Laterality: Right;  Insertion of Ventriculostomy Catheter   VIDEO BRONCHOSCOPY WITH ENDOBRONCHIAL NAVIGATION Left 05/20/2021   Procedure: VIDEO BRONCHOSCOPY WITH ENDOBRONCHIAL NAVIGATION;  Surgeon: Josephine Igo, DO;  Location: [facility] OR;  Service: Pulmonary;  Laterality: Left;   VIDEO BRONCHOSCOPY WITH ENDOBRONCHIAL ULTRASOUND Bilateral 05/20/2021   Procedure: VIDEO BRONCHOSCOPY WITH ENDOBRONCHIAL ULTRASOUND;  Surgeon: Josephine Igo, DO;  Location: [facility] OR;  Service: Pulmonary;  Laterality: Bilateral;   WISDOM TOOTH EXTRACTION      REVIEW OF SYSTEMS:  Constitutional: positive for fatigue Eyes: negative Ears, nose, mouth, throat, and face: negative Respiratory: positive for dyspnea on exertion Cardiovascular: negative Gastrointestinal: negative Genitourinary:negative Integument/breast: negative Hematologic/lymphatic: negative Musculoskeletal:negative Neurological: negative Behavioral/Psych: negative Endocrine: negative Allergic/Immunologic: negative   PHYSICAL EXAMINATION: General appearance: alert, cooperative, fatigued, and no distress Head: Normocephalic, without obvious abnormality, atraumatic Neck: no adenopathy, no JVD, supple, symmetrical, trachea midline, and thyroid not enlarged, symmetric, no tenderness/mass/nodules Lymph nodes: Cervical, supraclavicular, and axillary nodes normal. Resp: clear to auscultation bilaterally Back: symmetric, no curvature. ROM normal. No CVA tenderness. Cardio: regular rate and rhythm, S1, S2 normal, no murmur, click, rub or gallop GI: soft, non-tender; bowel sounds normal; no masses,  no organomegaly Extremities:  extremities normal, atraumatic, no cyanosis or edema Neurologic: Alert and oriented X 3, normal strength and tone. Normal symmetric reflexes. Normal coordination and gait  ECOG PERFORMANCE STATUS: 1 - Symptomatic but completely ambulatory  Blood pressure 126/65, pulse 94, temperature (!) 97.5 F (36.4 C), temperature source Oral, resp. rate 17, height 5\' 6"  (1.676 m), weight 139 lb 6.4 oz (63.2 kg), SpO2 97 %.  LABORATORY DATA: Lab Results  Component Value Date   WBC 8.0 05/16/2023   HGB 10.7 (L) 05/16/2023   HCT 32.6 (L) 05/16/2023   MCV 104.8 (H) 05/16/2023   PLT 490 (H) 05/16/2023      Chemistry      Component Value Date/Time   NA 139 04/24/2023 0847   NA 141 04/12/2021 1356   K 4.1 04/24/2023 0847   CL 103 04/24/2023 0847   CO2 31 04/24/2023 0847   BUN 12 04/24/2023 0847   BUN 13 04/12/2021 1356   CREATININE 0.96 04/24/2023 0847   CREATININE 0.99 08/29/2016 1130      Component Value Date/Time   CALCIUM 9.2 04/24/2023 0847   ALKPHOS 72 04/24/2023 0847   AST 22 04/24/2023 0847   ALT 12 04/24/2023 0847   BILITOT 0.2 (L) 04/24/2023 0847       RADIOGRAPHIC STUDIES: MR Brain W Wo Contrast  Result Date: 05/16/2023 CLINICAL DATA:  72 year old female with metastatic lung cancer. Brain metastases diagnosed in July 2022 status post SRS. Solitary residual left frontal gyrus lesion on restaging MRIs since last year. Subsequent encounter. EXAM: MRI HEAD WITHOUT AND WITH CONTRAST TECHNIQUE: Multiplanar, multiecho pulse sequences of the brain and surrounding structures were obtained without and with intravenous contrast. CONTRAST:  7 mL [employer] COMPARISON:  Brain MRI 01/11/2023 and 10/12/2022. FINDINGS: Brain: 5 mm enhancing left superior frontal gyrus lesion on series 13, image 136 is unchanged in size since December 2023. However, regional FLAIR hyperintensity has mildly increased since that time on series 10, image 50 (compare to the same series and image number on 10/12/2022). No  significant regional mass effect. No new abnormal intracranial enhancement. No dural thickening identified. And stable gray and white matter signal elsewhere, with no other areas suspicious for cerebral edema. Mild encephalomalacia along a linear probable previous shunt track into the right lateral ventricle through the right superior frontal gyrus. Stable hemosiderin there, and a chronic microhemorrhage in the anterior left frontal lobe series 9, image 68. No restricted diffusion to suggest acute infarction. No midline shift, ventriculomegaly, extra-axial collection or acute  intracranial hemorrhage. Cervicomedullary junction and pituitary are within normal limits. Vascular: Major intracranial vascular flow voids are stable. Chronically coiled basilar tip aneurysm as seen on prior CT in 2022. Skull and upper cervical spine: Negative for age visible cervical spine, spinal cord. Previous right vertex craniotomy or burr hole. Visualized bone marrow signal is within normal limits. Sinuses/Orbits: Stable mild paranasal sinus mucosal thickening. Stable orbits. Other: Mastoids are clear. Visible internal auditory structures appear normal. IMPRESSION: 1. Solitary residual left superior frontal gyrus metastasis size is stable (5 mm) since December 2023, but regional FLAIR hyperintensity has mildly increased since that time. No associated mass effect. 2. No new intracranial metastatic disease. No other acute intracranial abnormality. 3. Previously coil embolized Basilar Tip Aneurysm. Electronically Signed   By: Odessa Fleming M.D.   On: 05/16/2023 08:09   US THORACENTESIS ASP PLEURAL SPACE W/IMG GUIDE  Result Date: 04/17/2023 INDICATION: Patient with history of stage IV lung cancer, dyspnea, left pleural effusion. Request received for therapeutic left thoracentesis EXAM: ULTRASOUND GUIDED THERAPEUTIC LEFT THORACENTESIS MEDICATIONS: 8 mL 1% lidocaine COMPLICATIONS: None immediate. PROCEDURE: An ultrasound guided thoracentesis was  thoroughly discussed with the patient and questions answered. The benefits, risks, alternatives and complications were also discussed. The patient understands and wishes to proceed with the procedure. Written consent was obtained. Ultrasound was performed to localize and mark an adequate pocket of fluid in the left chest. The area was then prepped and draped in the normal sterile fashion. 1% Lidocaine was used for local anesthesia. Under ultrasound guidance a 6 Fr Safe-T-Centesis catheter was introduced. Thoracentesis was performed. The catheter was removed and a dressing applied. FINDINGS: A total of approximately 1.1 liters of yellow fluid was removed. IMPRESSION: Successful ultrasound guided therapeutic LEFT thoracentesis yielding 1.1 liters of pleural fluid. Performed by: Jeananne Rama, PA-C Electronically Signed   By: Roanna Banning M.D.   On: 04/17/2023 13:40   DG Chest 1 View  Result Date: 04/17/2023 CLINICAL DATA:  [id number] S/P thoracentesis [id number] EXAM: PORTABLE CHEST 1 VIEW COMPARISON:  IR thoracentesis, earlier same day. Chest XR, 05/20/2021. CT chest, 03/05/2023. FINDINGS: Support lines: RIGHT IJ-approach chest port with catheter tip well-positioned at the superior cavoatrial junction. The LEFT cardiac border is obscured. Coronary bypass changes. The RIGHT lung is clear. LEFT perihilar and LEFT basilar opacities, with small volume residual LEFT pleural effusion. No pneumothorax. Median sternotomy changes. No acute osseous abnormality. IMPRESSION: 1. No pneumothorax post LEFT thoracentesis. Small volume residual pleural effusion. 2. LEFT perihilar and basilar opacities, favored to represent atelectasis. Electronically Signed   By: Roanna Banning M.D.   On: 04/17/2023 12:48    ASSESSMENT AND PLAN: This is a very pleasant 72 years old white female diagnosed with a stage IV (T2 a, N2, M1 B) non-small cell lung cancer, adenocarcinoma presented with left upper lobe lung mass in addition to left hilar and  precarinal metastatic adenopathy as well as small left lower lobe pulmonary nodule and solitary left frontal brain metastasis diagnosed in July 2022.  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 The patient is status post SRS to the solitary brain metastasis. She underwent a course of concurrent chemoradiation to the locally advanced disease in the chest with carboplatin for AUC of 2 and paclitaxel 45 Mg/M2 status post 7 cycles.  The patient has been tolerating her treatment well with no concerning adverse effects. Her scan showed mild improvement of her disease with no concerning findings for progression. Technically the patient has  a stage IV lung cancer with brain metastasis  The patient is currently undergoing systemic chemotherapy with carboplatin for AUC of 5, Alimta 500 Mg/M2 and Keytruda 200 Mg IV every 3 weeks status post 28 cycles.  Starting from cycle #5 the patient will be on maintenance treatment with Alimta and Keytruda every 3 weeks.   The patient has been tolerating this treatment well with no concerning adverse effects except for mild fatigue. She had repeat MRI of the brain that showed solitary residual left superior frontal gyrus metastatic site that stable around 5 mm.  She is scheduled to undergo SRS under the care of Dr. Kathrynn Running on 05/23/2023. She also had repeat CT scan of the chest, abdomen and pelvis performed recently.  I personally and independently reviewed the scan images and discussed the result with the patient today.  Her scan showed no concerning findings for disease progression and there was decrease in the size of the left pleural effusion. I recommended for the patient to proceed with cycle #29 today as planned. I will see her back for follow-up visit in 3 weeks for evaluation before the next cycle of her treatment. For the anemia, I advised her to take oral iron tablet every other day with vitamin C. The patient was advised  to call immediately if she has any other concerning symptoms in the interval. The patient voices understanding of current disease status and treatment options and is in agreement with the current care plan. The total time spent in the appointment was 30 minutes.  All questions were answered. The patient knows to call the clinic with any problems, questions or concerns. We can certainly see the patient much sooner if necessary.  Disclaimer: This note was dictated with voice recognition software. Similar sounding words can inadvertently be transcribed and may not be corrected upon review.

## 2023-05-19 ENCOUNTER — Other Ambulatory Visit: Payer: Self-pay

## 2023-05-20 ENCOUNTER — Other Ambulatory Visit: Payer: Self-pay

## 2023-05-21 ENCOUNTER — Inpatient Hospital Stay: Payer: Medicare HMO

## 2023-05-22 NOTE — Progress Notes (Signed)
Radiation Oncology         [phone] ________________________________  Name: Jill [name] MRN: [id number]  Date: 05/23/2023  DOB: 1951/11/03  Post Treatment Note  CC: [name], MD  Si Gaul, MD  Diagnosis:   72 yo woman with stage IV NSCLC, adenocarcinoma of the left upper lung - Stage IVA - with brain metastases     Interval Since Last Radiation: 1 year and 8 months 09/28/21: SRS//PTV2-3: The metastatic lesions  in the left occipital (3 mm) and left temporal (1 mm) were treated to 20 Gy in a single fraction  06/23/21: SRS// PTV1: The solitary 7 mm left frontal  brain metastasis was treated to 20 Gy in a single fraction   06/15/21 - 08/01/21: The primary tumor in the left lung and nodes were treated to 66 Gy in 33 fractions of 2 Gy (concurrent with chemotherapy).  Narrative:  I contacted the patient to conduct her routine scheduled 4 month follow up visit to review results of her recent MRI brain scan via telephone to spare the patient unnecessary potential exposure in the healthcare setting during the current COVID-19 pandemic.  The patient was notified in advance and gave permission to proceed with this visit format.    She tolerated her SRS treatments well and she remains without complaints.  Her recent MRI brain scan from 05/15/2023 shows a stable appearance of the residual 5 mm focus of enhancement in the left frontal lobe (previously treated lesion) with only slight increased regional flair and no visible sequela of previously treated lesions in the left temporal lobe and left occipital lobe.  There are no new lesions.  We reviewed these results today.                        On review of systems, the patient states that she is doing well in general and is currently without complaints aside from some mild fatigue. She has recently been experiencing some headaches and nausea, but attributes this to a viral illness she is currently getting over. She also endorses some  weakness and dizziness over the past 4-5 months that she attributes to her decrease in appetite. Her weight has remained stable. She specifically denies chest pain, increased shortness of breath or hemoptysis. She has occasional, mild headaches but nothing that has been persistent or concerning.  She denies any decrease in her visual or auditory acuity, focal weakness in the upper or lower extremities, tremor or seizure activity.  She initially completed 6 cycles of systemic therapy with carboplatin/paclitaxel systemic chemotherapy prior to switching to carboplatin, Alimta and Keytruda which she completed 5 cycles of. She is now on maintenance therapy with Alimta/Keytruda since 12/29/2021 and continues to tolerate this well.  Her restaging CT C/A/P from 05/11/23 was stable in appearance and without evidence of disease recurrence or progression. She has been having some increased shortness of breath and fatigue and underwent left-sided ultrasound-guided thoracentesis on 04/17/2023 with drainage of 1.1 L of fluids. Her breathing has improved since that procedure and overall, she is pleased with her progress to date. She had a recent follow up visit with Dr. Arbutus Ped 05/16/23 with plans to continue her maintenance therapy.   Her 12th grandchild, 5th grand-daughter, was born 11/02/2021 and brings her great joy. She enjoys spending time with her grand-kids daily which helps keep her active.  ALLERGIES:  is allergic to varenicline, ace inhibitors, prednisone, betalin 12 [vitamin b12], penicillins, and sulfa drugs cross  reactors.  Meds: Current Outpatient Medications  Medication Sig Dispense Refill   azithromycin (ZITHROMAX) 250 MG tablet Use as instructed 6 each 0   acetaminophen (TYLENOL) 500 MG tablet Take 500 mg by mouth every 6 (six) hours as needed.     albuterol (PROVENTIL) (2.5 MG/3ML) 0.083% nebulizer solution Take 3 mLs (2.5 mg total) by nebulization every 6 (six) hours as needed for wheezing or shortness of  breath. 75 mL 12   albuterol (VENTOLIN HFA) 108 (90 Base) MCG/ACT inhaler Inhale 2 puffs into the lungs every 6 (six) hours as needed for wheezing or shortness of breath. 8 g 2   amLODipine (NORVASC) 10 MG tablet TAKE 1 TABLET BY MOUTH ONCE DAILY . APPOINTMENT REQUIRED FOR FUTURE REFILLS 90 tablet 1   aspirin EC 81 MG tablet Take 1 tablet (81 mg total) by mouth daily. 90 tablet 2   atorvastatin (LIPITOR) 40 MG tablet Take 1 tablet (40 mg total) by mouth daily. 90 tablet 1   Cholecalciferol (VITAMIN D3) 250 MCG (10000 UT) capsule Take 10,000 mcg by mouth daily.     diphenhydrAMINE HCl (BENADRYL ALLERGY PO) Take 25 mg by mouth at bedtime as needed (for sleep).     folic acid (FOLVITE) 1 MG tablet Take 1 tablet (1 mg total) by mouth daily. 90 tablet 0   hydrocortisone cream 0.5 % Apply 1 Application topically 2 (two) times daily. As needed for itching. 30 g 0   levothyroxine (SYNTHROID) 150 MCG tablet TAKE 1 TABLET BY MOUTH DAILY BEFORE BREAKFAST. 90 tablet 0   lidocaine-prilocaine (EMLA) cream Apply 1 Application topically as needed. 30 g 2   losartan (COZAAR) 100 MG tablet Take 1 tablet (100 mg total) by mouth daily. 90 tablet 1   metoprolol succinate (TOPROL-XL) 50 MG 24 hr tablet TAKE 1 TABLET BY MOUTH ONCE DAILY WITH MEALS OR  IMMEDIATLEY  AFTER  A  MEAL 90 tablet 1   mirtazapine (REMERON) 15 MG tablet TAKE 1 TABLET BY MOUTH EVERYDAY AT BEDTIME 90 tablet 1   Multiple Vitamins-Minerals (MULTIVITAMIN WITH MINERALS) tablet Take 1 tablet by mouth daily.     nitroGLYCERIN (NITROSTAT) 0.4 MG SL tablet Place 1 tablet (0.4 mg total) under the tongue every 5 (five) minutes as needed. For chest pain 25 tablet 6   prochlorperazine (COMPAZINE) 10 MG tablet TAKE 1 TABLET BY MOUTH EVERY 6 HOURS AS NEEDED FOR NAUSEA FOR VOMITING 30 tablet 0   umeclidinium-vilanterol (ANORO ELLIPTA) 62.5-25 MCG/ACT AEPB Inhale 1 puff into the lungs daily. 60 each 1   Vitamin A 2400 MCG (8000 UT) TABS Take 2,400 mg by mouth  daily.     No current facility-administered medications for this encounter.    Physical Findings:  vitals were not taken for this visit.   /10 Unable to assess due to telephone follow-up visit format.  Lab Findings: Lab Results  Component Value Date   WBC 8.0 05/16/2023   HGB 10.7 (L) 05/16/2023   HCT 32.6 (L) 05/16/2023   MCV 104.8 (H) 05/16/2023   PLT 490 (H) 05/16/2023     Radiographic Findings: CT Chest W Contrast  Result Date: 05/16/2023 CLINICAL DATA:  Follow-up non-small-cell lung carcinoma. Undergoing chemotherapy. Previous radiation therapy. * Tracking Code: BO * EXAM: CT CHEST, ABDOMEN, AND PELVIS WITH CONTRAST TECHNIQUE: Multidetector CT imaging of the chest, abdomen and pelvis was performed following the standard protocol during bolus administration of intravenous contrast. RADIATION DOSE REDUCTION: This exam was performed according to the departmental dose-optimization program which  includes automated exposure control, adjustment of the mA and/or kV according to patient size and/or use of iterative reconstruction technique. CONTRAST:  OMNIPAQUE IOHEXOL 300 MG/ML  SOLN COMPARISON:  03/05/2023 FINDINGS: CT CHEST FINDINGS Cardiovascular: No acute findings. Stable pericardial thickening noted. Prior CABG. Mediastinum/Lymph Nodes: No masses or pathologically enlarged lymph nodes identified. Lungs/Pleura: Small to moderate left pleural effusion shows slight decrease in size since prior study. Persistent solid nodular opacity is seen in the posterior left upper lobe, measuring 2.5 x 2.2 cm on image 55/6. This remains stable in size when remeasured in same planes on prior exam. Left upper lobe scarring and mild bronchiectasis is also stable, likely due to prior radiation therapy. 6 mm sub-solid nodule in the right middle lobe on image 92/6 remains stable. No new or enlarging nodules or masses identified. Moderate to severe centrilobular emphysema again noted. Musculoskeletal:  No  suspicious bone lesions identified. CT ABDOMEN AND PELVIS FINDINGS Hepatobiliary: No suspicious masses identified. Tiny less than 5 mm calcified gallstone seen, however there is no evidence of cholecystitis or biliary ductal dilatation. Pancreas:  No mass or inflammatory changes. Spleen:  Within normal limits in size and appearance. Adrenals/Urinary tract: Normal adrenal glands. Stable small benign-appearing left renal cysts (No followup imaging is recommended). No suspicious masses or hydronephrosis. Stomach/Bowel: Small posterior gastric fundal diverticulum incidentally noted. No evidence of obstruction, inflammatory process, or abnormal fluid collections. Vascular/Lymphatic: No pathologically enlarged lymph nodes identified. No acute vascular findings. Reproductive:  No mass or other significant abnormality identified. Other:  None. Musculoskeletal:  No suspicious bone lesions identified. IMPRESSION: Stable 2.5 cm solid nodular opacity in posterior left upper lobe. Stable 6 mm sub-solid nodule in right middle lobe. No new or progressive disease within the thorax. Slight decrease in size of left pleural effusion. No evidence of abdominal or pelvic metastatic disease. Aortic Atherosclerosis (ICD10-I70.0) and Emphysema (ICD10-J43.9). Electronically Signed   By: Danae Orleans M.D.   On: 05/16/2023 08:28   CT Abdomen Pelvis W Contrast  Result Date: 05/16/2023 CLINICAL DATA:  Follow-up non-small-cell lung carcinoma. Undergoing chemotherapy. Previous radiation therapy. * Tracking Code: BO * EXAM: CT CHEST, ABDOMEN, AND PELVIS WITH CONTRAST TECHNIQUE: Multidetector CT imaging of the chest, abdomen and pelvis was performed following the standard protocol during bolus administration of intravenous contrast. RADIATION DOSE REDUCTION: This exam was performed according to the departmental dose-optimization program which includes automated exposure control, adjustment of the mA and/or kV according to patient size and/or use  of iterative reconstruction technique. CONTRAST:  OMNIPAQUE IOHEXOL 300 MG/ML  SOLN COMPARISON:  03/05/2023 FINDINGS: CT CHEST FINDINGS Cardiovascular: No acute findings. Stable pericardial thickening noted. Prior CABG. Mediastinum/Lymph Nodes: No masses or pathologically enlarged lymph nodes identified. Lungs/Pleura: Small to moderate left pleural effusion shows slight decrease in size since prior study. Persistent solid nodular opacity is seen in the posterior left upper lobe, measuring 2.5 x 2.2 cm on image 55/6. This remains stable in size when remeasured in same planes on prior exam. Left upper lobe scarring and mild bronchiectasis is also stable, likely due to prior radiation therapy. 6 mm sub-solid nodule in the right middle lobe on image 92/6 remains stable. No new or enlarging nodules or masses identified. Moderate to severe centrilobular emphysema again noted. Musculoskeletal:  No suspicious bone lesions identified. CT ABDOMEN AND PELVIS FINDINGS Hepatobiliary: No suspicious masses identified. Tiny less than 5 mm calcified gallstone seen, however there is no evidence of cholecystitis or biliary ductal dilatation. Pancreas:  No mass or inflammatory  changes. Spleen:  Within normal limits in size and appearance. Adrenals/Urinary tract: Normal adrenal glands. Stable small benign-appearing left renal cysts (No followup imaging is recommended). No suspicious masses or hydronephrosis. Stomach/Bowel: Small posterior gastric fundal diverticulum incidentally noted. No evidence of obstruction, inflammatory process, or abnormal fluid collections. Vascular/Lymphatic: No pathologically enlarged lymph nodes identified. No acute vascular findings. Reproductive:  No mass or other significant abnormality identified. Other:  None. Musculoskeletal:  No suspicious bone lesions identified. IMPRESSION: Stable 2.5 cm solid nodular opacity in posterior left upper lobe. Stable 6 mm sub-solid nodule in right middle lobe. No  new or progressive disease within the thorax. Slight decrease in size of left pleural effusion. No evidence of abdominal or pelvic metastatic disease. Aortic Atherosclerosis (ICD10-I70.0) and Emphysema (ICD10-J43.9). Electronically Signed   By: Danae Orleans M.D.   On: 05/16/2023 08:28   MR Brain W Wo Contrast  Result Date: 05/16/2023 CLINICAL DATA:  72 year old female with metastatic lung cancer. Brain metastases diagnosed in July 2022 status post SRS. Solitary residual left frontal gyrus lesion on restaging MRIs since last year. Subsequent encounter. EXAM: MRI HEAD WITHOUT AND WITH CONTRAST TECHNIQUE: Multiplanar, multiecho pulse sequences of the brain and surrounding structures were obtained without and with intravenous contrast. CONTRAST:  7 mL [employer] COMPARISON:  Brain MRI 01/11/2023 and 10/12/2022. FINDINGS: Brain: 5 mm enhancing left superior frontal gyrus lesion on series 13, image 136 is unchanged in size since December 2023. However, regional FLAIR hyperintensity has mildly increased since that time on series 10, image 50 (compare to the same series and image number on 10/12/2022). No significant regional mass effect. No new abnormal intracranial enhancement. No dural thickening identified. And stable gray and white matter signal elsewhere, with no other areas suspicious for cerebral edema. Mild encephalomalacia along a linear probable previous shunt track into the right lateral ventricle through the right superior frontal gyrus. Stable hemosiderin there, and a chronic microhemorrhage in the anterior left frontal lobe series 9, image 68. No restricted diffusion to suggest acute infarction. No midline shift, ventriculomegaly, extra-axial collection or acute intracranial hemorrhage. Cervicomedullary junction and pituitary are within normal limits. Vascular: Major intracranial vascular flow voids are stable. Chronically coiled basilar tip aneurysm as seen on prior CT in 2022. Skull and upper cervical  spine: Negative for age visible cervical spine, spinal cord. Previous right vertex craniotomy or burr hole. Visualized bone marrow signal is within normal limits. Sinuses/Orbits: Stable mild paranasal sinus mucosal thickening. Stable orbits. Other: Mastoids are clear. Visible internal auditory structures appear normal. IMPRESSION: 1. Solitary residual left superior frontal gyrus metastasis size is stable (5 mm) since December 2023, but regional FLAIR hyperintensity has mildly increased since that time. No associated mass effect. 2. No new intracranial metastatic disease. No other acute intracranial abnormality. 3. Previously coil embolized Basilar Tip Aneurysm. Electronically Signed   By: Odessa Fleming M.D.   On: 05/16/2023 08:09    Impression/Plan: 1. 72 yo woman with brain metastasis secondary to metastatic, NSCLC, adenocarcinoma of the left upper lung - Stage IVA. She has recovered well from the effects of her previous radiotherapy and remains without complaints.  She continues on maintenance therapy with Alimta and Keytruda which she is tolerating well.   Her recent MRI brain scan from 05/15/2023 shows a stable appearance of the residual 5 mm focus of enhancement in the left frontal lobe (previously treated lesion) with only a slight increase in regional flair and no visible sequela of previously treated lesions in the left temporal lobe and  left occipital lobe. There are no new lesions to suggest disease recurrence or progression.  She will continue management of her systemic disease under the care and direction of Dr. Shirline Frees and is scheduled for a follow up visit on 06/06/23.  Regarding the brain metastases, since her disease has remained stable for the past year, we will continue to monitor closely but can move to serial MRI brain scans every 4 months for the remainder of this year with plans to move to 6 month scans thereafter if she remains stable. We will continue to connect by telephone following each scan  to review the results and recommendations from the multidisciplinary brain tumor board.  She appears to have a good understanding of these recommendations and is comfortable and in agreement with the stated plan.  She knows to call at anytime in the interim with any questions or concerns related to her previous radiation.   I personally spent 20 minutes in this encounter including chart review, reviewing radiological studies, telephone discussion with the patient, entering orders and completing documentation.     Joyice Faster, PA-C

## 2023-05-23 ENCOUNTER — Other Ambulatory Visit: Payer: Self-pay

## 2023-05-23 ENCOUNTER — Ambulatory Visit
Admission: RE | Admit: 2023-05-23 | Discharge: 2023-05-23 | Disposition: A | Discharge status: Home / Self Care | Payer: Medicare HMO | Source: Ambulatory Visit | Attending: Urology | Admitting: Urology

## 2023-05-23 DIAGNOSIS — C3490 Malignant neoplasm of unspecified part of unspecified bronchus or lung: Secondary | ICD-10-CM

## 2023-05-28 ENCOUNTER — Ambulatory Visit
Admission: RE | Admit: 2023-05-28 | Discharge: 2023-05-28 | Disposition: A | Discharge status: Home / Self Care | Payer: Medicare HMO | Source: Ambulatory Visit | Attending: Radiation Oncology | Admitting: Radiation Oncology

## 2023-05-28 ENCOUNTER — Ambulatory Visit: Admission: RE | Admit: 2023-05-28 | Payer: Medicare HMO | Source: Ambulatory Visit | Admitting: Radiation Oncology

## 2023-05-28 ENCOUNTER — Encounter: Payer: Self-pay | Admitting: Radiation Oncology

## 2023-05-28 DIAGNOSIS — C3490 Malignant neoplasm of unspecified part of unspecified bronchus or lung: Secondary | ICD-10-CM

## 2023-05-28 NOTE — Progress Notes (Signed)
Telephone nursing appointment for patient to review most recent scan results from 05/16/2023. I verified patient's identity x2 and began nursing interview. Patient reports doing well. No issues conveyed at this time.   Meaningful use complete.   Patient aware of their 4pm-05/28/2023 telephone appointment w/ Ashlyn Bruning PA-C. I left my extension [phone] in case patient needs anything. Patient verbalized understanding. This concludes the nursing interview.   Patient contact [phone]     Ruel Favors, LPN

## 2023-05-31 ENCOUNTER — Other Ambulatory Visit: Payer: Self-pay

## 2023-06-01 ENCOUNTER — Other Ambulatory Visit: Payer: Self-pay | Admitting: Radiation Therapy

## 2023-06-01 DIAGNOSIS — C7931 Secondary malignant neoplasm of brain: Secondary | ICD-10-CM

## 2023-06-06 ENCOUNTER — Other Ambulatory Visit: Payer: Medicare HMO

## 2023-06-06 ENCOUNTER — Inpatient Hospital Stay (HOSPITAL_BASED_OUTPATIENT_CLINIC_OR_DEPARTMENT_OTHER): Payer: Medicare HMO | Admitting: Internal Medicine

## 2023-06-06 ENCOUNTER — Inpatient Hospital Stay: Payer: Medicare HMO

## 2023-06-06 ENCOUNTER — Other Ambulatory Visit: Payer: Self-pay

## 2023-06-06 VITALS — BP 131/73 | HR 83

## 2023-06-06 DIAGNOSIS — I252 Old myocardial infarction: Secondary | ICD-10-CM | POA: Diagnosis not present

## 2023-06-06 DIAGNOSIS — Z5112 Encounter for antineoplastic immunotherapy: Secondary | ICD-10-CM | POA: Diagnosis not present

## 2023-06-06 DIAGNOSIS — C3412 Malignant neoplasm of upper lobe, left bronchus or lung: Secondary | ICD-10-CM | POA: Diagnosis not present

## 2023-06-06 DIAGNOSIS — J479 Bronchiectasis, uncomplicated: Secondary | ICD-10-CM | POA: Diagnosis not present

## 2023-06-06 DIAGNOSIS — Z880 Allergy status to penicillin: Secondary | ICD-10-CM | POA: Diagnosis not present

## 2023-06-06 DIAGNOSIS — Z882 Allergy status to sulfonamides status: Secondary | ICD-10-CM | POA: Diagnosis not present

## 2023-06-06 DIAGNOSIS — C3490 Malignant neoplasm of unspecified part of unspecified bronchus or lung: Secondary | ICD-10-CM

## 2023-06-06 DIAGNOSIS — J449 Chronic obstructive pulmonary disease, unspecified: Secondary | ICD-10-CM | POA: Diagnosis not present

## 2023-06-06 DIAGNOSIS — E039 Hypothyroidism, unspecified: Secondary | ICD-10-CM | POA: Diagnosis not present

## 2023-06-06 DIAGNOSIS — Z8719 Personal history of other diseases of the digestive system: Secondary | ICD-10-CM | POA: Diagnosis not present

## 2023-06-06 DIAGNOSIS — N281 Cyst of kidney, acquired: Secondary | ICD-10-CM | POA: Diagnosis not present

## 2023-06-06 DIAGNOSIS — D649 Anemia, unspecified: Secondary | ICD-10-CM | POA: Diagnosis not present

## 2023-06-06 DIAGNOSIS — Z79620 Long term (current) use of immunosuppressive biologic: Secondary | ICD-10-CM | POA: Diagnosis not present

## 2023-06-06 DIAGNOSIS — J948 Other specified pleural conditions: Secondary | ICD-10-CM | POA: Diagnosis not present

## 2023-06-06 DIAGNOSIS — R110 Nausea: Secondary | ICD-10-CM | POA: Diagnosis not present

## 2023-06-06 DIAGNOSIS — I700 Atherosclerosis of aorta: Secondary | ICD-10-CM | POA: Diagnosis not present

## 2023-06-06 DIAGNOSIS — Z9221 Personal history of antineoplastic chemotherapy: Secondary | ICD-10-CM | POA: Diagnosis not present

## 2023-06-06 DIAGNOSIS — J90 Pleural effusion, not elsewhere classified: Secondary | ICD-10-CM | POA: Diagnosis not present

## 2023-06-06 DIAGNOSIS — E785 Hyperlipidemia, unspecified: Secondary | ICD-10-CM | POA: Diagnosis not present

## 2023-06-06 DIAGNOSIS — Z95828 Presence of other vascular implants and grafts: Secondary | ICD-10-CM

## 2023-06-06 DIAGNOSIS — Z79899 Other long term (current) drug therapy: Secondary | ICD-10-CM | POA: Diagnosis not present

## 2023-06-06 DIAGNOSIS — Z5111 Encounter for antineoplastic chemotherapy: Secondary | ICD-10-CM | POA: Diagnosis not present

## 2023-06-06 DIAGNOSIS — Z79631 Long term (current) use of antimetabolite agent: Secondary | ICD-10-CM | POA: Diagnosis not present

## 2023-06-06 DIAGNOSIS — Z87440 Personal history of urinary (tract) infections: Secondary | ICD-10-CM | POA: Diagnosis not present

## 2023-06-06 DIAGNOSIS — Z923 Personal history of irradiation: Secondary | ICD-10-CM | POA: Diagnosis not present

## 2023-06-06 DIAGNOSIS — C7931 Secondary malignant neoplasm of brain: Secondary | ICD-10-CM | POA: Diagnosis not present

## 2023-06-06 LAB — CMP (CANCER CENTER ONLY)
ALT: 11 U/L (ref 0–44)
AST: 18 U/L (ref 15–41)
Albumin: 3.3 g/dL — ABNORMAL LOW (ref 3.5–5.0)
Alkaline Phosphatase: 79 U/L (ref 38–126)
Anion gap: 6 (ref 5–15)
BUN: 14 mg/dL (ref 8–23)
CO2: 30 mmol/L (ref 22–32)
Calcium: 9.1 mg/dL (ref 8.9–10.3)
Chloride: 103 mmol/L (ref 98–111)
Creatinine: 0.91 mg/dL (ref 0.44–1.00)
GFR, Estimated: >60 mL/min (ref >60)
Glucose, Bld: 107 mg/dL — ABNORMAL HIGH (ref 70–99)
Potassium: 3.8 mmol/L (ref 3.5–5.1)
Sodium: 139 mmol/L (ref 135–145)
Total Bilirubin: 0.3 mg/dL (ref 0.3–1.2)
Total Protein: 7.3 g/dL (ref 6.5–8.1)

## 2023-06-06 LAB — CBC WITH DIFFERENTIAL (CANCER CENTER ONLY)
Abs Immature Granulocytes: 0.02 10*3/uL (ref 0.00–0.07)
Basophils Absolute: 0.1 10*3/uL (ref 0.0–0.1)
Basophils Relative: 1 %
Eosinophils Absolute: 0.2 10*3/uL (ref 0.0–0.5)
Eosinophils Relative: 3 %
HCT: 31.3 % — ABNORMAL LOW (ref 36.0–46.0)
Hemoglobin: 10 g/dL — ABNORMAL LOW (ref 12.0–15.0)
Immature Granulocytes: 0 %
Lymphocytes Relative: 16 %
Lymphs Abs: 1.1 10*3/uL (ref 0.7–4.0)
MCH: 32.9 pg (ref 26.0–34.0)
MCHC: 31.9 g/dL (ref 30.0–36.0)
MCV: 103 fL — ABNORMAL HIGH (ref 80.0–100.0)
Monocytes Absolute: 0.8 10*3/uL (ref 0.1–1.0)
Monocytes Relative: 11 %
Neutro Abs: 4.8 10*3/uL (ref 1.7–7.7)
Neutrophils Relative %: 69 %
Platelet Count: 444 10*3/uL — ABNORMAL HIGH (ref 150–400)
RBC: 3.04 MIL/uL — ABNORMAL LOW (ref 3.87–5.11)
RDW: 15.7 % — ABNORMAL HIGH (ref 11.5–15.5)
WBC Count: 7 10*3/uL (ref 4.0–10.5)
nRBC: 0 % (ref 0.0–0.2)

## 2023-06-06 LAB — TSH: TSH: 15.309 u[IU]/mL — ABNORMAL HIGH (ref 0.350–4.500)

## 2023-06-06 MED ORDER — DIPHENHYDRAMINE HCL 25 MG PO CAPS
25.0000 mg | ORAL_CAPSULE | Freq: Once | ORAL | Status: AC
  Administered 2023-06-06: 25 mg via ORAL
  Filled 2023-06-06: qty 1

## 2023-06-06 MED ORDER — SODIUM CHLORIDE 0.9 % IV SOLN: Freq: Once | INTRAVENOUS | Status: AC

## 2023-06-06 MED ORDER — CYANOCOBALAMIN 1000 MCG/ML IJ SOLN
1000.0000 ug | Freq: Once | INTRAMUSCULAR | Status: AC
  Administered 2023-06-06: 1000 ug via INTRAMUSCULAR
  Filled 2023-06-06: qty 1

## 2023-06-06 MED ORDER — SODIUM CHLORIDE 0.9 % IV SOLN
200.0000 mg | Freq: Once | INTRAVENOUS | Status: AC
  Administered 2023-06-06: 200 mg via INTRAVENOUS
  Filled 2023-06-06: qty 200

## 2023-06-06 MED ORDER — SODIUM CHLORIDE 0.9% FLUSH
10.0000 mL | Freq: Once | INTRAVENOUS | Status: AC
  Administered 2023-06-06: 10 mL

## 2023-06-06 MED ORDER — SODIUM CHLORIDE 0.9% FLUSH
10.0000 mL | INTRAVENOUS | Status: DC | PRN
  Administered 2023-06-06: 10 mL

## 2023-06-06 MED ORDER — HEPARIN SOD (PORK) LOCK FLUSH 100 UNIT/ML IV SOLN
500.0000 [IU] | Freq: Once | INTRAVENOUS | Status: AC | PRN
  Administered 2023-06-06: 500 [IU]

## 2023-06-06 NOTE — Progress Notes (Signed)
[facility] [phone]   [phone]  OFFICE PROGRESS NOTE  [name], MD [address] [place] [address]  DIAGNOSIS: Stage IV (T2 a, N2, M1b) non-small cell lung cancer, adenocarcinoma presented with left upper lobe lung mass in addition to left hilar and precarinal metastatic adenopathy and small left lower lobe pulmonary nodule in addition to solitary left frontal brain metastasis diagnosed in July 2022.  Molecular studies by [employer] showed  Positive KRAS G12C mutation PD-L1 expression was less than 1%   PRIOR THERAPY:  1) Status post SRS to the solitary brain metastasis. 20 Treatment for the locally advanced disease in the chest with carboplatin for AUC of 2 and paclitaxel 45 Mg/M2.  Status post 6 cycles.  Last dose was given 07/25/2021.  CURRENT THERAPY: Systemic chemotherapy with carboplatin for AUC of 5, Alimta 500 Mg/M2 and Keytruda 200 Mg IV every 3 weeks.  First dose August 31, 2021.  Status post 29 cycles.  Starting from cycle #5 the patient will be on maintenance treatment with Alimta and Keytruda every 3 weeks.  Starting from cycle #30 she will be on single agent Keytruda 200 Mg IV every 3 weeks.  Alimta was discontinued secondary to intolerance.  INTERVAL HISTORY: JENEL WICE 72 y.o. female returns to the clinic today for follow-up visit.  The patient is feeling fine with no concerning complaints except for the increasing fatigue and weakness.  She denied having any chest pain, shortness of breath, cough or hemoptysis.  She has occasional nausea but no significant vomiting, diarrhea or constipation.  She has no headache or visual changes.  She is here today for evaluation before starting cycle #30 of her treatment.  MEDICAL HISTORY: Past Medical History:  Diagnosis Date   Anxiety    ON PAXIL, XANAX   AVM (arteriovenous malformation) brain    s/p stent/coil   Blood transfusion    x 2   Cancer (HCC)     Left lung   COPD (chronic obstructive pulmonary disease) (HCC)    no inhaler, no oxygen   Coronary artery disease    Prior inferior MI with stent to RCA, s/p CABG in 2008   Depression    Dizziness    Dyspnea    with exertion, no oxygen   Fatigue    Foot injury 06/01/2017   right - RESOLVED, no longer an issue per patient 05/18/21   GERD (gastroesophageal reflux disease)    Headache(784.0)    UNRUPTURED CEREBRAL ANEURYSM   Hyperlipidemia    Hypertension    Hypothyroidism    Infected cyst of skin 09/18/2013   Left knee pain 06/05/2012   Lung mass    Left lung   Myocardial infarction (HCC)    Normal nuclear stress test Ju;y 2012   No ischemia. EF 70%; fixed defect involving septum, inferoseptal and inferior wall   Pain, dental 02/06/2017   Poor dental hygiene    Retroperitoneal bleeding    Following cardiac cath   Tobacco abuse    Urine discoloration 09/18/2013   Urine incontinence 07/06/2020   UTI (lower urinary tract infection) 10/16/2013    ALLERGIES:  is allergic to varenicline, ace inhibitors, prednisone, betalin 12 [vitamin b12], penicillins, and sulfa drugs cross reactors.  MEDICATIONS:  Current Outpatient Medications  Medication Sig Dispense Refill   azithromycin (ZITHROMAX) 250 MG tablet Use as instructed 6 each 0   acetaminophen (TYLENOL) 500 MG tablet Take 500 mg by mouth  every 6 (six) hours as needed.     albuterol (PROVENTIL) (2.5 MG/3ML) 0.083% nebulizer solution Take 3 mLs (2.5 mg total) by nebulization every 6 (six) hours as needed for wheezing or shortness of breath. 75 mL 12   albuterol (VENTOLIN HFA) 108 (90 Base) MCG/ACT inhaler Inhale 2 puffs into the lungs every 6 (six) hours as needed for wheezing or shortness of breath. 8 g 2   amLODipine (NORVASC) 10 MG tablet TAKE 1 TABLET BY MOUTH ONCE DAILY . APPOINTMENT REQUIRED FOR FUTURE REFILLS 90 tablet 1   aspirin EC 81 MG tablet Take 1 tablet (81 mg total) by mouth daily. 90 tablet 2   atorvastatin  (LIPITOR) 40 MG tablet Take 1 tablet (40 mg total) by mouth daily. 90 tablet 1   Cholecalciferol (VITAMIN D3) 250 MCG (10000 UT) capsule Take 10,000 mcg by mouth daily.     diphenhydrAMINE HCl (BENADRYL ALLERGY PO) Take 25 mg by mouth at bedtime as needed (for sleep).     folic acid (FOLVITE) 1 MG tablet Take 1 tablet (1 mg total) by mouth daily. 90 tablet 0   hydrocortisone cream 0.5 % Apply 1 Application topically 2 (two) times daily. As needed for itching. 30 g 0   levothyroxine (SYNTHROID) 150 MCG tablet TAKE 1 TABLET BY MOUTH DAILY BEFORE BREAKFAST. 90 tablet 0   lidocaine-prilocaine (EMLA) cream Apply 1 Application topically as needed. 30 g 2   losartan (COZAAR) 100 MG tablet Take 1 tablet (100 mg total) by mouth daily. 90 tablet 1   metoprolol succinate (TOPROL-XL) 50 MG 24 hr tablet TAKE 1 TABLET BY MOUTH ONCE DAILY WITH MEALS OR  IMMEDIATLEY  AFTER  A  MEAL 90 tablet 1   mirtazapine (REMERON) 15 MG tablet TAKE 1 TABLET BY MOUTH EVERYDAY AT BEDTIME 90 tablet 1   Multiple Vitamins-Minerals (MULTIVITAMIN WITH MINERALS) tablet Take 1 tablet by mouth daily.     nitroGLYCERIN (NITROSTAT) 0.4 MG SL tablet Place 1 tablet (0.4 mg total) under the tongue every 5 (five) minutes as needed. For chest pain 25 tablet 6   prochlorperazine (COMPAZINE) 10 MG tablet TAKE 1 TABLET BY MOUTH EVERY 6 HOURS AS NEEDED FOR NAUSEA FOR VOMITING 30 tablet 0   umeclidinium-vilanterol (ANORO ELLIPTA) 62.5-25 MCG/ACT AEPB Inhale 1 puff into the lungs daily. 60 each 1   Vitamin A 2400 MCG (8000 UT) TABS Take 2,400 mg by mouth daily.     No current facility-administered medications for this visit.    SURGICAL HISTORY:  Past Surgical History:  Procedure Laterality Date   CARDIAC CATHETERIZATION  01/02/2007   IT REVEALS MILD INFERIOR WALL HYPOKINESIS. THE EJECTION FRACTION IS AROUND 50%   COLONOSCOPY     CORONARY ARTERY BYPASS GRAFT  11/06/2006   LIMA to LAD, SVG to DX, SVG to LCX & SVG to OM 1 & 2, and SVG to PD    CORONARY STENT PLACEMENT     Remote past stent to RCA   EYE SURGERY Right    cataracts removed   HEMORRHOID SURGERY  11/07/1987   IR IMAGING GUIDED PORT INSERTION  10/04/2022   UPPER GI ENDOSCOPY     VENTRICULOSTOMY  10/06/2011   Procedure: VENTRICULOSTOMY;  Surgeon: Carmela Hurt;  Location: [facility];  Service: Neurosurgery;  Laterality: Right;  Insertion of Ventriculostomy Catheter   VIDEO BRONCHOSCOPY WITH ENDOBRONCHIAL NAVIGATION Left 05/20/2021   Procedure: VIDEO BRONCHOSCOPY WITH ENDOBRONCHIAL NAVIGATION;  Surgeon: Josephine Igo, DO;  Location: [facility] OR;  Service: Pulmonary;  Laterality: Left;   VIDEO BRONCHOSCOPY WITH ENDOBRONCHIAL ULTRASOUND Bilateral 05/20/2021   Procedure: VIDEO BRONCHOSCOPY WITH ENDOBRONCHIAL ULTRASOUND;  Surgeon: Josephine Igo, DO;  Location: [facility] OR;  Service: Pulmonary;  Laterality: Bilateral;   WISDOM TOOTH EXTRACTION      REVIEW OF SYSTEMS:  A comprehensive review of systems was negative except for: Constitutional: positive for fatigue Gastrointestinal: positive for nausea   PHYSICAL EXAMINATION: General appearance: alert, cooperative, fatigued, and no distress Head: Normocephalic, without obvious abnormality, atraumatic Neck: no adenopathy, no JVD, supple, symmetrical, trachea midline, and thyroid not enlarged, symmetric, no tenderness/mass/nodules Lymph nodes: Cervical, supraclavicular, and axillary nodes normal. Resp: clear to auscultation bilaterally Back: symmetric, no curvature. ROM normal. No CVA tenderness. Cardio: regular rate and rhythm, S1, S2 normal, no murmur, click, rub or gallop GI: soft, non-tender; bowel sounds normal; no masses,  no organomegaly Extremities: extremities normal, atraumatic, no cyanosis or edema  ECOG PERFORMANCE STATUS: 1 - Symptomatic but completely ambulatory  Blood pressure 119/72, pulse 90, temperature 98 F (36.7 C), temperature source Oral, resp. rate 17, height 5\' 6"  (1.676 m), weight 139 lb (63 kg), SpO2  99%.  LABORATORY DATA: Lab Results  Component Value Date   WBC 8.0 05/16/2023   HGB 10.7 (L) 05/16/2023   HCT 32.6 (L) 05/16/2023   MCV 104.8 (H) 05/16/2023   PLT 490 (H) 05/16/2023      Chemistry      Component Value Date/Time   NA 138 05/16/2023 0908   NA 141 04/12/2021 1356   K 3.6 05/16/2023 0908   CL 103 05/16/2023 0908   CO2 30 05/16/2023 0908   BUN 11 05/16/2023 0908   BUN 13 04/12/2021 1356   CREATININE 1.02 (H) 05/16/2023 0908   CREATININE 0.99 08/29/2016 1130      Component Value Date/Time   CALCIUM 9.1 05/16/2023 0908   ALKPHOS 83 05/16/2023 0908   AST 21 05/16/2023 0908   ALT 9 05/16/2023 0908   BILITOT 0.3 05/16/2023 0908       RADIOGRAPHIC STUDIES: CT Chest W Contrast  Result Date: 05/16/2023 CLINICAL DATA:  Follow-up non-small-cell lung carcinoma. Undergoing chemotherapy. Previous radiation therapy. * Tracking Code: BO * EXAM: CT CHEST, ABDOMEN, AND PELVIS WITH CONTRAST TECHNIQUE: Multidetector CT imaging of the chest, abdomen and pelvis was performed following the standard protocol during bolus administration of intravenous contrast. RADIATION DOSE REDUCTION: This exam was performed according to the departmental dose-optimization program which includes automated exposure control, adjustment of the mA and/or kV according to patient size and/or use of iterative reconstruction technique. CONTRAST:  OMNIPAQUE IOHEXOL 300 MG/ML  SOLN COMPARISON:  03/05/2023 FINDINGS: CT CHEST FINDINGS Cardiovascular: No acute findings. Stable pericardial thickening noted. Prior CABG. Mediastinum/Lymph Nodes: No masses or pathologically enlarged lymph nodes identified. Lungs/Pleura: Small to moderate left pleural effusion shows slight decrease in size since prior study. Persistent solid nodular opacity is seen in the posterior left upper lobe, measuring 2.5 x 2.2 cm on image 55/6. This remains stable in size when remeasured in same planes on prior exam. Left upper lobe scarring  and mild bronchiectasis is also stable, likely due to prior radiation therapy. 6 mm sub-solid nodule in the right middle lobe on image 92/6 remains stable. No new or enlarging nodules or masses identified. Moderate to severe centrilobular emphysema again noted. Musculoskeletal:  No suspicious bone lesions identified. CT ABDOMEN AND PELVIS FINDINGS Hepatobiliary: No suspicious masses identified. Tiny less than 5 mm calcified gallstone seen, however there is no evidence of cholecystitis or  biliary ductal dilatation. Pancreas:  No mass or inflammatory changes. Spleen:  Within normal limits in size and appearance. Adrenals/Urinary tract: Normal adrenal glands. Stable small benign-appearing left renal cysts (No followup imaging is recommended). No suspicious masses or hydronephrosis. Stomach/Bowel: Small posterior gastric fundal diverticulum incidentally noted. No evidence of obstruction, inflammatory process, or abnormal fluid collections. Vascular/Lymphatic: No pathologically enlarged lymph nodes identified. No acute vascular findings. Reproductive:  No mass or other significant abnormality identified. Other:  None. Musculoskeletal:  No suspicious bone lesions identified. IMPRESSION: Stable 2.5 cm solid nodular opacity in posterior left upper lobe. Stable 6 mm sub-solid nodule in right middle lobe. No new or progressive disease within the thorax. Slight decrease in size of left pleural effusion. No evidence of abdominal or pelvic metastatic disease. Aortic Atherosclerosis (ICD10-I70.0) and Emphysema (ICD10-J43.9). Electronically Signed   By: Danae Orleans M.D.   On: 05/16/2023 08:28   CT Abdomen Pelvis W Contrast  Result Date: 05/16/2023 CLINICAL DATA:  Follow-up non-small-cell lung carcinoma. Undergoing chemotherapy. Previous radiation therapy. * Tracking Code: BO * EXAM: CT CHEST, ABDOMEN, AND PELVIS WITH CONTRAST TECHNIQUE: Multidetector CT imaging of the chest, abdomen and pelvis was performed following the  standard protocol during bolus administration of intravenous contrast. RADIATION DOSE REDUCTION: This exam was performed according to the departmental dose-optimization program which includes automated exposure control, adjustment of the mA and/or kV according to patient size and/or use of iterative reconstruction technique. CONTRAST:  OMNIPAQUE IOHEXOL 300 MG/ML  SOLN COMPARISON:  03/05/2023 FINDINGS: CT CHEST FINDINGS Cardiovascular: No acute findings. Stable pericardial thickening noted. Prior CABG. Mediastinum/Lymph Nodes: No masses or pathologically enlarged lymph nodes identified. Lungs/Pleura: Small to moderate left pleural effusion shows slight decrease in size since prior study. Persistent solid nodular opacity is seen in the posterior left upper lobe, measuring 2.5 x 2.2 cm on image 55/6. This remains stable in size when remeasured in same planes on prior exam. Left upper lobe scarring and mild bronchiectasis is also stable, likely due to prior radiation therapy. 6 mm sub-solid nodule in the right middle lobe on image 92/6 remains stable. No new or enlarging nodules or masses identified. Moderate to severe centrilobular emphysema again noted. Musculoskeletal:  No suspicious bone lesions identified. CT ABDOMEN AND PELVIS FINDINGS Hepatobiliary: No suspicious masses identified. Tiny less than 5 mm calcified gallstone seen, however there is no evidence of cholecystitis or biliary ductal dilatation. Pancreas:  No mass or inflammatory changes. Spleen:  Within normal limits in size and appearance. Adrenals/Urinary tract: Normal adrenal glands. Stable small benign-appearing left renal cysts (No followup imaging is recommended). No suspicious masses or hydronephrosis. Stomach/Bowel: Small posterior gastric fundal diverticulum incidentally noted. No evidence of obstruction, inflammatory process, or abnormal fluid collections. Vascular/Lymphatic: No pathologically enlarged lymph nodes identified. No acute  vascular findings. Reproductive:  No mass or other significant abnormality identified. Other:  None. Musculoskeletal:  No suspicious bone lesions identified. IMPRESSION: Stable 2.5 cm solid nodular opacity in posterior left upper lobe. Stable 6 mm sub-solid nodule in right middle lobe. No new or progressive disease within the thorax. Slight decrease in size of left pleural effusion. No evidence of abdominal or pelvic metastatic disease. Aortic Atherosclerosis (ICD10-I70.0) and Emphysema (ICD10-J43.9). Electronically Signed   By: Danae Orleans M.D.   On: 05/16/2023 08:28   MR Brain W Wo Contrast  Result Date: 05/16/2023 CLINICAL DATA:  72 year old female with metastatic lung cancer. Brain metastases diagnosed in July 2022 status post SRS. Solitary residual left frontal gyrus lesion on restaging MRIs  since last year. Subsequent encounter. EXAM: MRI HEAD WITHOUT AND WITH CONTRAST TECHNIQUE: Multiplanar, multiecho pulse sequences of the brain and surrounding structures were obtained without and with intravenous contrast. CONTRAST:  7 mL [employer] COMPARISON:  Brain MRI 01/11/2023 and 10/12/2022. FINDINGS: Brain: 5 mm enhancing left superior frontal gyrus lesion on series 13, image 136 is unchanged in size since December 2023. However, regional FLAIR hyperintensity has mildly increased since that time on series 10, image 50 (compare to the same series and image number on 10/12/2022). No significant regional mass effect. No new abnormal intracranial enhancement. No dural thickening identified. And stable gray and white matter signal elsewhere, with no other areas suspicious for cerebral edema. Mild encephalomalacia along a linear probable previous shunt track into the right lateral ventricle through the right superior frontal gyrus. Stable hemosiderin there, and a chronic microhemorrhage in the anterior left frontal lobe series 9, image 68. No restricted diffusion to suggest acute infarction. No midline shift,  ventriculomegaly, extra-axial collection or acute intracranial hemorrhage. Cervicomedullary junction and pituitary are within normal limits. Vascular: Major intracranial vascular flow voids are stable. Chronically coiled basilar tip aneurysm as seen on prior CT in 2022. Skull and upper cervical spine: Negative for age visible cervical spine, spinal cord. Previous right vertex craniotomy or burr hole. Visualized bone marrow signal is within normal limits. Sinuses/Orbits: Stable mild paranasal sinus mucosal thickening. Stable orbits. Other: Mastoids are clear. Visible internal auditory structures appear normal. IMPRESSION: 1. Solitary residual left superior frontal gyrus metastasis size is stable (5 mm) since December 2023, but regional FLAIR hyperintensity has mildly increased since that time. No associated mass effect. 2. No new intracranial metastatic disease. No other acute intracranial abnormality. 3. Previously coil embolized Basilar Tip Aneurysm. Electronically Signed   By: Odessa Fleming M.D.   On: 05/16/2023 08:09    ASSESSMENT AND PLAN: This is a very pleasant 72 years old white female diagnosed with a stage IV (T2 a, N2, M1 B) non-small cell lung cancer, adenocarcinoma presented with left upper lobe lung mass in addition to left hilar and precarinal metastatic adenopathy as well as small left lower lobe pulmonary nodule and solitary left frontal brain metastasis diagnosed in July 2022.  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 The patient is status post SRS to the solitary brain metastasis. She underwent a course of concurrent chemoradiation to the locally advanced disease in the chest with carboplatin for AUC of 2 and paclitaxel 45 Mg/M2 status post 7 cycles.  The patient has been tolerating her treatment well with no concerning adverse effects. Her scan showed mild improvement of her disease with no concerning findings for progression. Technically the  patient has a stage IV lung cancer with brain metastasis  The patient is currently undergoing systemic chemotherapy with carboplatin for AUC of 5, Alimta 500 Mg/M2 and Keytruda 200 Mg IV every 3 weeks status post 29 cycles.  Starting from cycle #5 the patient will be on maintenance treatment with Alimta and Keytruda every 3 weeks.  Starting from cycle #30 she will be on treatment with single agent Keytruda every 3 weeks.  Alimta was discontinued secondary to increasing fatigue and weakness and intolerance. The patient has been tolerating her maintenance treatment well except for the increasing fatigue and nausea. I recommended for her to proceed with cycle #30 but with only single agent Keytruda. Her last MRI of the brain that showed solitary residual left superior frontal gyrus metastatic site that stable around  5 mm.  She underwent SRS under the care of Dr. Kathrynn Running on 05/23/2023. I will see the patient back for follow-up visit in 3 weeks for evaluation before starting cycle #31. For the anemia, I advised her to take oral iron tablet every other day with vitamin C. She was advised to call immediately if she has any other concerning symptoms in the interval. The patient voices understanding of current disease status and treatment options and is in agreement with the current care plan. The total time spent in the appointment was 30 minutes.  All questions were answered. The patient knows to call the clinic with any problems, questions or concerns. We can certainly see the patient much sooner if necessary.  Disclaimer: This note was dictated with voice recognition software. Similar sounding words can inadvertently be transcribed and may not be corrected upon review.

## 2023-06-06 NOTE — Progress Notes (Signed)
Per Dr. Arbutus Ped, OK to administer B-12 injection today, even though patient not to receive Alimta infusion.

## 2023-06-06 NOTE — Patient Instructions (Signed)

## 2023-06-07 ENCOUNTER — Other Ambulatory Visit: Payer: Self-pay | Admitting: Family Medicine

## 2023-06-08 ENCOUNTER — Other Ambulatory Visit: Payer: Self-pay | Admitting: Family Medicine

## 2023-06-15 ENCOUNTER — Other Ambulatory Visit: Payer: Self-pay | Admitting: Internal Medicine

## 2023-06-15 DIAGNOSIS — J441 Chronic obstructive pulmonary disease with (acute) exacerbation: Secondary | ICD-10-CM | POA: Diagnosis not present

## 2023-06-16 ENCOUNTER — Other Ambulatory Visit: Payer: Self-pay | Admitting: Physician Assistant

## 2023-06-16 DIAGNOSIS — C3490 Malignant neoplasm of unspecified part of unspecified bronchus or lung: Secondary | ICD-10-CM

## 2023-06-16 DIAGNOSIS — E039 Hypothyroidism, unspecified: Secondary | ICD-10-CM

## 2023-06-21 ENCOUNTER — Other Ambulatory Visit: Payer: Self-pay | Admitting: Family Medicine

## 2023-06-21 DIAGNOSIS — I10 Essential (primary) hypertension: Secondary | ICD-10-CM

## 2023-06-23 NOTE — Progress Notes (Unsigned)
[facility] OFFICE PROGRESS NOTE  [name], MD [address] [place] [address]  DIAGNOSIS:  Stage IV (T2 a, N2, M1b) non-small cell lung cancer, adenocarcinoma presented with left upper lobe lung mass in addition to left hilar and precarinal metastatic adenopathy and small left lower lobe pulmonary nodule in addition to solitary left frontal brain metastasis diagnosed in July 2022.   Molecular studies by [employer] showed  Positive KRAS G12C mutation PD-L1 expression was less than 1%  PRIOR THERAPY: 1) Status post SRS to the solitary brain metastasis under the care of Dr. Kathrynn Running. Completed on 06/23/21 2)  Treatment for the locally advanced disease in the chest with carboplatin for AUC of 2 and paclitaxel 45 Mg/M2.  Status post 6 cycles.  Last dose was given 07/25/2021. 3) SRS to the additional metastatic brain lesions on 09/28/22  CURRENT THERAPY: Systemic chemotherapy with carboplatin for AUC of 5, Alimta 500 Mg/M2 and Keytruda 200 Mg IV every 3 weeks.  First dose August 31, 2021.  Status post 30 cycles.  Starting from cycle #5 the patient will be on maintenance treatment with Alimta and Keytruda every 3 weeks.  Starting from cycle #30 she will be on single agent Keytruda 200 Mg IV every 3 weeks.  Alimta was discontinued secondary to intolerance.   INTERVAL HISTORY: Jill [name] 72 y.o. female returns to the clinic today for a follow-up visit. The patient was last seen by Dr. Arbutus Ped 3 weeks ago. She is feeling fairly well today. She struggles with fatigue.  She denies any fever. She states she gets cold after she eats but otherwise denies any chills.  She denies night sweats. Weight loss. She denies any chest pain or hemoptysis.  She still continues to smoke cigarettes.  Denies any diarrhea or constipation.  She may have emesis after having a coughing episode. She reports some dry skin on her face and on her elbows bilaterally.  Denies any headache or  visual changes. She is here today for evaluation repeat blood work before undergoing cycle #31.      MEDICAL HISTORY: Past Medical History:  Diagnosis Date   Anxiety    ON PAXIL, XANAX   AVM (arteriovenous malformation) brain    s/p stent/coil   Blood transfusion    x 2   Cancer (HCC)    Left lung   COPD (chronic obstructive pulmonary disease) (HCC)    no inhaler, no oxygen   Coronary artery disease    Prior inferior MI with stent to RCA, s/p CABG in 2008   Depression    Dizziness    Dyspnea    with exertion, no oxygen   Fatigue    Foot injury 06/01/2017   right - RESOLVED, no longer an issue per patient 05/18/21   GERD (gastroesophageal reflux disease)    Headache(784.0)    UNRUPTURED CEREBRAL ANEURYSM   Hyperlipidemia    Hypertension    Hypothyroidism    Infected cyst of skin 09/18/2013   Left knee pain 06/05/2012   Lung mass    Left lung   Myocardial infarction (HCC)    Normal nuclear stress test Ju;y 2012   No ischemia. EF 70%; fixed defect involving septum, inferoseptal and inferior wall   Pain, dental 02/06/2017   Poor dental hygiene    Retroperitoneal bleeding    Following cardiac cath   Tobacco abuse    Urine discoloration 09/18/2013   Urine incontinence 07/06/2020   UTI (lower urinary tract  infection) 10/16/2013    ALLERGIES:  is allergic to varenicline, ace inhibitors, prednisone, betalin 12 [vitamin b12], penicillins, and sulfa drugs cross reactors.  MEDICATIONS:  Current Outpatient Medications  Medication Sig Dispense Refill   azithromycin (ZITHROMAX) 250 MG tablet Use as instructed 6 each 0   acetaminophen (TYLENOL) 500 MG tablet Take 500 mg by mouth every 6 (six) hours as needed.     albuterol (PROVENTIL) (2.5 MG/3ML) 0.083% nebulizer solution Take 3 mLs (2.5 mg total) by nebulization every 6 (six) hours as needed for wheezing or shortness of breath. 75 mL 12   albuterol (VENTOLIN HFA) 108 (90 Base) MCG/ACT inhaler Inhale 2 puffs into the lungs  every 6 (six) hours as needed for wheezing or shortness of breath. 8 g 2   amLODipine (NORVASC) 10 MG tablet TAKE 1 TABLET BY MOUTH ONCE DAILY . APPOINTMENT REQUIRED FOR FUTURE REFILLS 90 tablet 1   aspirin EC 81 MG tablet Take 1 tablet (81 mg total) by mouth daily. 90 tablet 2   atorvastatin (LIPITOR) 40 MG tablet Take 1 tablet (40 mg total) by mouth daily. 90 tablet 1   Cholecalciferol (VITAMIN D3) 250 MCG (10000 UT) capsule Take 10,000 mcg by mouth daily.     diphenhydrAMINE HCl (BENADRYL ALLERGY PO) Take 25 mg by mouth at bedtime as needed (for sleep).     folic acid (FOLVITE) 1 MG tablet Take 1 tablet (1 mg total) by mouth daily. 90 tablet 0   hydrocortisone cream 0.5 % Apply 1 Application topically 2 (two) times daily. As needed for itching. 30 g 0   levothyroxine (SYNTHROID) 150 MCG tablet TAKE 1 TABLET BY MOUTH EVERY DAY BEFORE BREAKFAST 90 tablet 0   lidocaine-prilocaine (EMLA) cream Apply 1 Application topically as needed. 30 g 2   losartan (COZAAR) 100 MG tablet TAKE 1 TABLET BY MOUTH EVERY DAY 90 tablet 1   metoprolol succinate (TOPROL-XL) 50 MG 24 hr tablet TAKE 1 TABLET BY MOUTH ONCE DAILY WITH MEALS OR IMMEDIATLEY AFTER A MEAL 90 tablet 1   mirtazapine (REMERON) 15 MG tablet TAKE 1 TABLET BY MOUTH EVERYDAY AT BEDTIME 90 tablet 1   Multiple Vitamins-Minerals (MULTIVITAMIN WITH MINERALS) tablet Take 1 tablet by mouth daily.     nitroGLYCERIN (NITROSTAT) 0.4 MG SL tablet Place 1 tablet (0.4 mg total) under the tongue every 5 (five) minutes as needed. For chest pain 25 tablet 6   prochlorperazine (COMPAZINE) 10 MG tablet TAKE 1 TABLET BY MOUTH EVERY 6 HOURS AS NEEDED NAUSEA AND VOMITING 30 tablet 0   umeclidinium-vilanterol (ANORO ELLIPTA) 62.5-25 MCG/ACT AEPB Inhale 1 puff into the lungs daily. 60 each 1   Vitamin A 2400 MCG (8000 UT) TABS Take 2,400 mg by mouth daily.     No current facility-administered medications for this visit.    SURGICAL HISTORY:  Past Surgical History:   Procedure Laterality Date   CARDIAC CATHETERIZATION  01/02/2007   IT REVEALS MILD INFERIOR WALL HYPOKINESIS. THE EJECTION FRACTION IS AROUND 50%   COLONOSCOPY     CORONARY ARTERY BYPASS GRAFT  11/06/2006   LIMA to LAD, SVG to DX, SVG to LCX & SVG to OM 1 & 2, and SVG to PD   CORONARY STENT PLACEMENT     Remote past stent to RCA   EYE SURGERY Right    cataracts removed   HEMORRHOID SURGERY  11/07/1987   IR IMAGING GUIDED PORT INSERTION  10/04/2022   UPPER GI ENDOSCOPY     VENTRICULOSTOMY  10/06/2011  Procedure: VENTRICULOSTOMY;  Surgeon: Carmela Hurt;  Location: [facility];  Service: Neurosurgery;  Laterality: Right;  Insertion of Ventriculostomy Catheter   VIDEO BRONCHOSCOPY WITH ENDOBRONCHIAL NAVIGATION Left 05/20/2021   Procedure: VIDEO BRONCHOSCOPY WITH ENDOBRONCHIAL NAVIGATION;  Surgeon: Josephine Igo, DO;  Location: [facility] OR;  Service: Pulmonary;  Laterality: Left;   VIDEO BRONCHOSCOPY WITH ENDOBRONCHIAL ULTRASOUND Bilateral 05/20/2021   Procedure: VIDEO BRONCHOSCOPY WITH ENDOBRONCHIAL ULTRASOUND;  Surgeon: Josephine Igo, DO;  Location: [facility] OR;  Service: Pulmonary;  Laterality: Bilateral;   WISDOM TOOTH EXTRACTION      REVIEW OF SYSTEMS:   Review of Systems  Constitutional: Negative for appetite change, chills, fatigue, fever and unexpected weight change.  HENT:   Negative for mouth sores, nosebleeds, sore throat and trouble swallowing.   Eyes: Negative for eye problems and icterus.  Respiratory: Negative for cough, hemoptysis, shortness of breath and wheezing.   Cardiovascular: Negative for chest pain and leg swelling.  Gastrointestinal: Negative for abdominal pain, constipation, diarrhea, nausea and vomiting.  Genitourinary: Negative for bladder incontinence, difficulty urinating, dysuria, frequency and hematuria.   Musculoskeletal: Negative for back pain, gait problem, neck pain and neck stiffness.  Skin: Negative for itching and rash.  Neurological: Negative for  dizziness, extremity weakness, gait problem, headaches, light-headedness and seizures.  Hematological: Negative for adenopathy. Does not bruise/bleed easily.  Psychiatric/Behavioral: Negative for confusion, depression and sleep disturbance. The patient is not nervous/anxious.     PHYSICAL EXAMINATION:  There were no vitals taken for this visit.  ECOG PERFORMANCE STATUS: {CHL ONC ECOG [id number]  Physical Exam  Constitutional: Oriented to person, place, and time and well-developed, well-nourished, and in no distress. No distress.  HENT:  Head: Normocephalic and atraumatic.  Mouth/Throat: Oropharynx is clear and moist. No oropharyngeal exudate.  Eyes: Conjunctivae are normal. Right eye exhibits no discharge. Left eye exhibits no discharge. No scleral icterus.  Neck: Normal range of motion. Neck supple.  Cardiovascular: Normal rate, regular rhythm, normal heart sounds and intact distal pulses.   Pulmonary/Chest: Effort normal and breath sounds normal. No respiratory distress. No wheezes. No rales.  Abdominal: Soft. Bowel sounds are normal. Exhibits no distension and no mass. There is no tenderness.  Musculoskeletal: Normal range of motion. Exhibits no edema.  Lymphadenopathy:    No cervical adenopathy.  Neurological: Alert and oriented to person, place, and time. Exhibits normal muscle tone. Gait normal. Coordination normal.  Skin: Skin is warm and dry. No rash noted. Not diaphoretic. No erythema. No pallor.  Psychiatric: Mood, memory and judgment normal.  Vitals reviewed.  LABORATORY DATA: Lab Results  Component Value Date   WBC 7.0 06/06/2023   HGB 10.0 (L) 06/06/2023   HCT 31.3 (L) 06/06/2023   MCV 103.0 (H) 06/06/2023   PLT 444 (H) 06/06/2023      Chemistry      Component Value Date/Time   NA 139 06/06/2023 1327   NA 141 04/12/2021 1356   K 3.8 06/06/2023 1327   CL 103 06/06/2023 1327   CO2 30 06/06/2023 1327   BUN 14 06/06/2023 1327   BUN 13 04/12/2021 1356    CREATININE 0.91 06/06/2023 1327   CREATININE 0.99 08/29/2016 1130      Component Value Date/Time   CALCIUM 9.1 06/06/2023 1327   ALKPHOS 79 06/06/2023 1327   AST 18 06/06/2023 1327   ALT 11 06/06/2023 1327   BILITOT 0.3 06/06/2023 1327       RADIOGRAPHIC STUDIES:  No results found.   ASSESSMENT/PLAN:  This  is a very pleasant 72 year old Caucasian female diagnosed with stage IV (T2 a, N2, M1 B) non-small cell lung cancer, adenocarcinoma.  She presented with a left upper lobe lung mass in addition to left hilar and precarinal metastatic adenopathy as well as a small left lower lobe pulmonary nodule and a solitary left frontal brain metastasis.  This was diagnosed in July 2022.  Her molecular studies show that she is positive for K-ras G12C mutation and her PD-L1 expression is negative.    She completed SRS to the solitary brain metastasis under the care of Dr. Kathrynn Running which was completed on 06/23/2021.  She had a repeat brain MRI performed on 09/15/2021 which shows a new 1 mm and 3 mm metastatic brain lesions.  She received SRS on 09/28/2021.   She completed concurrent chemoradiation for the locally advanced disease in the chest with carboplatin for an AUC of 2 and paclitaxel 45 mg per metered squared.  She is status post 6 cycles.  She had been tolerating treatment well without any concerning adverse side effects.   The patient is currently undergoing systemic chemotherapy with carboplatin AUC 5, Alimta 500 mg per metered squared, Keytruda 20 mg IV every 3 weeks.  She is status post 30 cycles.  She started maintenance alimta and Keytruda starting from cycle #5.     The patient was seen with Dr. Arbutus Ped today. Labs were reviewed. Recommend ***  We will see her back for labs and a follow up visit in 3 weeks for repeat blood work before undergoing cycle #32   Last scan on 7/5.  The patient was advised to call immediately if she has any concerning symptoms in the interval. The patient  voices understanding of current disease status and treatment options and is in agreement with the current care plan. All questions were answered. The patient knows to call the clinic with any problems, questions or concerns. We can certainly see the patient much sooner if necessary      No orders of the defined types were placed in this encounter.    I spent {CHL ONC TIME VISIT - [id number]} counseling the patient face to face. The total time spent in the appointment was {CHL ONC TIME VISIT - [id number]}.  Maycel Riffe L Iban Utz, PA-C 06/23/23

## 2023-06-27 ENCOUNTER — Inpatient Hospital Stay: Payer: Medicare HMO

## 2023-06-27 ENCOUNTER — Ambulatory Visit: Payer: Medicare HMO

## 2023-06-27 ENCOUNTER — Inpatient Hospital Stay: Payer: Medicare HMO | Attending: Internal Medicine

## 2023-06-27 ENCOUNTER — Inpatient Hospital Stay (HOSPITAL_BASED_OUTPATIENT_CLINIC_OR_DEPARTMENT_OTHER): Payer: Medicare HMO | Admitting: Physician Assistant

## 2023-06-27 ENCOUNTER — Ambulatory Visit: Payer: Medicare HMO | Admitting: Physician Assistant

## 2023-06-27 ENCOUNTER — Other Ambulatory Visit: Payer: Medicare HMO

## 2023-06-27 DIAGNOSIS — J449 Chronic obstructive pulmonary disease, unspecified: Secondary | ICD-10-CM | POA: Insufficient documentation

## 2023-06-27 DIAGNOSIS — Z87440 Personal history of urinary (tract) infections: Secondary | ICD-10-CM | POA: Insufficient documentation

## 2023-06-27 DIAGNOSIS — Z888 Allergy status to other drugs, medicaments and biological substances status: Secondary | ICD-10-CM | POA: Insufficient documentation

## 2023-06-27 DIAGNOSIS — Z5112 Encounter for antineoplastic immunotherapy: Secondary | ICD-10-CM | POA: Insufficient documentation

## 2023-06-27 DIAGNOSIS — R61 Generalized hyperhidrosis: Secondary | ICD-10-CM | POA: Diagnosis not present

## 2023-06-27 DIAGNOSIS — R6881 Early satiety: Secondary | ICD-10-CM | POA: Diagnosis not present

## 2023-06-27 DIAGNOSIS — C779 Secondary and unspecified malignant neoplasm of lymph node, unspecified: Secondary | ICD-10-CM | POA: Diagnosis not present

## 2023-06-27 DIAGNOSIS — Z923 Personal history of irradiation: Secondary | ICD-10-CM | POA: Insufficient documentation

## 2023-06-27 DIAGNOSIS — Z79620 Long term (current) use of immunosuppressive biologic: Secondary | ICD-10-CM | POA: Insufficient documentation

## 2023-06-27 DIAGNOSIS — Z8719 Personal history of other diseases of the digestive system: Secondary | ICD-10-CM | POA: Diagnosis not present

## 2023-06-27 DIAGNOSIS — E785 Hyperlipidemia, unspecified: Secondary | ICD-10-CM | POA: Insufficient documentation

## 2023-06-27 DIAGNOSIS — Z880 Allergy status to penicillin: Secondary | ICD-10-CM | POA: Insufficient documentation

## 2023-06-27 DIAGNOSIS — E039 Hypothyroidism, unspecified: Secondary | ICD-10-CM | POA: Diagnosis not present

## 2023-06-27 DIAGNOSIS — Z882 Allergy status to sulfonamides status: Secondary | ICD-10-CM | POA: Diagnosis not present

## 2023-06-27 DIAGNOSIS — C3490 Malignant neoplasm of unspecified part of unspecified bronchus or lung: Secondary | ICD-10-CM

## 2023-06-27 DIAGNOSIS — R630 Anorexia: Secondary | ICD-10-CM | POA: Insufficient documentation

## 2023-06-27 DIAGNOSIS — I252 Old myocardial infarction: Secondary | ICD-10-CM | POA: Diagnosis not present

## 2023-06-27 DIAGNOSIS — R110 Nausea: Secondary | ICD-10-CM | POA: Insufficient documentation

## 2023-06-27 DIAGNOSIS — R0602 Shortness of breath: Secondary | ICD-10-CM | POA: Insufficient documentation

## 2023-06-27 DIAGNOSIS — C3412 Malignant neoplasm of upper lobe, left bronchus or lung: Secondary | ICD-10-CM

## 2023-06-27 DIAGNOSIS — Z79899 Other long term (current) drug therapy: Secondary | ICD-10-CM | POA: Diagnosis not present

## 2023-06-27 DIAGNOSIS — C7931 Secondary malignant neoplasm of brain: Secondary | ICD-10-CM | POA: Diagnosis not present

## 2023-06-27 DIAGNOSIS — Z95828 Presence of other vascular implants and grafts: Secondary | ICD-10-CM

## 2023-06-27 LAB — CMP (CANCER CENTER ONLY)
ALT: 10 U/L (ref 0–44)
AST: 19 U/L (ref 15–41)
Albumin: 3.3 g/dL — ABNORMAL LOW (ref 3.5–5.0)
Alkaline Phosphatase: 94 U/L (ref 38–126)
Anion gap: 5 (ref 5–15)
BUN: 9 mg/dL (ref 8–23)
CO2: 33 mmol/L — ABNORMAL HIGH (ref 22–32)
Calcium: 9.8 mg/dL (ref 8.9–10.3)
Chloride: 101 mmol/L (ref 98–111)
Creatinine: 0.9 mg/dL (ref 0.44–1.00)
GFR, Estimated: >60 mL/min (ref >60)
Glucose, Bld: 152 mg/dL — ABNORMAL HIGH (ref 70–99)
Potassium: 3.3 mmol/L — ABNORMAL LOW (ref 3.5–5.1)
Sodium: 139 mmol/L (ref 135–145)
Total Bilirubin: 0.4 mg/dL (ref 0.3–1.2)
Total Protein: 7.5 g/dL (ref 6.5–8.1)

## 2023-06-27 LAB — CBC WITH DIFFERENTIAL (CANCER CENTER ONLY)
Abs Immature Granulocytes: 0.02 10*3/uL (ref 0.00–0.07)
Basophils Absolute: 0.1 10*3/uL (ref 0.0–0.1)
Basophils Relative: 1 %
Eosinophils Absolute: 0.2 10*3/uL (ref 0.0–0.5)
Eosinophils Relative: 2 %
HCT: 33.6 % — ABNORMAL LOW (ref 36.0–46.0)
Hemoglobin: 11 g/dL — ABNORMAL LOW (ref 12.0–15.0)
Immature Granulocytes: 0 %
Lymphocytes Relative: 16 %
Lymphs Abs: 1.2 10*3/uL (ref 0.7–4.0)
MCH: 33.3 pg (ref 26.0–34.0)
MCHC: 32.7 g/dL (ref 30.0–36.0)
MCV: 101.8 fL — ABNORMAL HIGH (ref 80.0–100.0)
Monocytes Absolute: 0.7 10*3/uL (ref 0.1–1.0)
Monocytes Relative: 9 %
Neutro Abs: 5.3 10*3/uL (ref 1.7–7.7)
Neutrophils Relative %: 72 %
Platelet Count: 339 10*3/uL (ref 150–400)
RBC: 3.3 MIL/uL — ABNORMAL LOW (ref 3.87–5.11)
RDW: 14.6 % (ref 11.5–15.5)
WBC Count: 7.4 10*3/uL (ref 4.0–10.5)
nRBC: 0 % (ref 0.0–0.2)

## 2023-06-27 MED ORDER — SODIUM CHLORIDE 0.9% FLUSH
10.0000 mL | Freq: Once | INTRAVENOUS | Status: AC
  Administered 2023-06-27: 10 mL

## 2023-06-27 MED ORDER — SODIUM CHLORIDE 0.9 % IV SOLN
200.0000 mg | Freq: Once | INTRAVENOUS | Status: AC
  Administered 2023-06-27: 200 mg via INTRAVENOUS
  Filled 2023-06-27: qty 200

## 2023-06-27 MED ORDER — SODIUM CHLORIDE 0.9 % IV SOLN: Freq: Once | INTRAVENOUS | Status: AC

## 2023-06-27 NOTE — Patient Instructions (Signed)

## 2023-07-12 ENCOUNTER — Ambulatory Visit (HOSPITAL_COMMUNITY)
Admission: RE | Admit: 2023-07-12 | Discharge: 2023-07-12 | Disposition: A | Discharge status: Home / Self Care | Payer: Medicare HMO | Source: Ambulatory Visit | Attending: Physician Assistant | Admitting: Physician Assistant

## 2023-07-12 DIAGNOSIS — C3412 Malignant neoplasm of upper lobe, left bronchus or lung: Secondary | ICD-10-CM | POA: Diagnosis not present

## 2023-07-12 DIAGNOSIS — R911 Solitary pulmonary nodule: Secondary | ICD-10-CM | POA: Diagnosis not present

## 2023-07-12 DIAGNOSIS — J90 Pleural effusion, not elsewhere classified: Secondary | ICD-10-CM | POA: Diagnosis not present

## 2023-07-12 DIAGNOSIS — N281 Cyst of kidney, acquired: Secondary | ICD-10-CM | POA: Diagnosis not present

## 2023-07-12 MED ORDER — SODIUM CHLORIDE (PF) 0.9 % IJ SOLN
INTRAMUSCULAR | Status: AC
  Filled 2023-07-12: qty 50

## 2023-07-12 MED ORDER — IOHEXOL 300 MG/ML  SOLN
100.0000 mL | Freq: Once | INTRAMUSCULAR | Status: AC | PRN
  Administered 2023-07-12: 100 mL via INTRAVENOUS

## 2023-07-16 DIAGNOSIS — J441 Chronic obstructive pulmonary disease with (acute) exacerbation: Secondary | ICD-10-CM | POA: Diagnosis not present

## 2023-07-17 ENCOUNTER — Inpatient Hospital Stay: Payer: Medicare HMO | Attending: Internal Medicine

## 2023-07-17 ENCOUNTER — Inpatient Hospital Stay: Payer: Medicare HMO

## 2023-07-17 ENCOUNTER — Inpatient Hospital Stay (HOSPITAL_BASED_OUTPATIENT_CLINIC_OR_DEPARTMENT_OTHER): Payer: Medicare HMO | Admitting: Internal Medicine

## 2023-07-17 DIAGNOSIS — Z880 Allergy status to penicillin: Secondary | ICD-10-CM | POA: Diagnosis not present

## 2023-07-17 DIAGNOSIS — I252 Old myocardial infarction: Secondary | ICD-10-CM | POA: Insufficient documentation

## 2023-07-17 DIAGNOSIS — C3490 Malignant neoplasm of unspecified part of unspecified bronchus or lung: Secondary | ICD-10-CM

## 2023-07-17 DIAGNOSIS — Z95828 Presence of other vascular implants and grafts: Secondary | ICD-10-CM

## 2023-07-17 DIAGNOSIS — Z888 Allergy status to other drugs, medicaments and biological substances status: Secondary | ICD-10-CM | POA: Insufficient documentation

## 2023-07-17 DIAGNOSIS — C7931 Secondary malignant neoplasm of brain: Secondary | ICD-10-CM | POA: Diagnosis not present

## 2023-07-17 DIAGNOSIS — Z9221 Personal history of antineoplastic chemotherapy: Secondary | ICD-10-CM | POA: Insufficient documentation

## 2023-07-17 DIAGNOSIS — Z5112 Encounter for antineoplastic immunotherapy: Secondary | ICD-10-CM | POA: Insufficient documentation

## 2023-07-17 DIAGNOSIS — D649 Anemia, unspecified: Secondary | ICD-10-CM | POA: Insufficient documentation

## 2023-07-17 DIAGNOSIS — E785 Hyperlipidemia, unspecified: Secondary | ICD-10-CM | POA: Diagnosis not present

## 2023-07-17 DIAGNOSIS — J90 Pleural effusion, not elsewhere classified: Secondary | ICD-10-CM | POA: Insufficient documentation

## 2023-07-17 DIAGNOSIS — Z923 Personal history of irradiation: Secondary | ICD-10-CM | POA: Diagnosis not present

## 2023-07-17 DIAGNOSIS — R059 Cough, unspecified: Secondary | ICD-10-CM | POA: Diagnosis not present

## 2023-07-17 DIAGNOSIS — E039 Hypothyroidism, unspecified: Secondary | ICD-10-CM | POA: Insufficient documentation

## 2023-07-17 DIAGNOSIS — Z79620 Long term (current) use of immunosuppressive biologic: Secondary | ICD-10-CM | POA: Diagnosis not present

## 2023-07-17 DIAGNOSIS — R0989 Other specified symptoms and signs involving the circulatory and respiratory systems: Secondary | ICD-10-CM | POA: Diagnosis not present

## 2023-07-17 DIAGNOSIS — Z882 Allergy status to sulfonamides status: Secondary | ICD-10-CM | POA: Diagnosis not present

## 2023-07-17 DIAGNOSIS — F32A Depression, unspecified: Secondary | ICD-10-CM | POA: Insufficient documentation

## 2023-07-17 DIAGNOSIS — Z8719 Personal history of other diseases of the digestive system: Secondary | ICD-10-CM | POA: Diagnosis not present

## 2023-07-17 DIAGNOSIS — J439 Emphysema, unspecified: Secondary | ICD-10-CM | POA: Insufficient documentation

## 2023-07-17 DIAGNOSIS — Z79899 Other long term (current) drug therapy: Secondary | ICD-10-CM | POA: Diagnosis not present

## 2023-07-17 DIAGNOSIS — C3412 Malignant neoplasm of upper lobe, left bronchus or lung: Secondary | ICD-10-CM | POA: Insufficient documentation

## 2023-07-17 DIAGNOSIS — K449 Diaphragmatic hernia without obstruction or gangrene: Secondary | ICD-10-CM | POA: Insufficient documentation

## 2023-07-17 DIAGNOSIS — Z87440 Personal history of urinary (tract) infections: Secondary | ICD-10-CM | POA: Insufficient documentation

## 2023-07-17 DIAGNOSIS — I700 Atherosclerosis of aorta: Secondary | ICD-10-CM | POA: Insufficient documentation

## 2023-07-17 LAB — CMP (CANCER CENTER ONLY)
ALT: 8 U/L (ref 0–44)
AST: 20 U/L (ref 15–41)
Albumin: 3.3 g/dL — ABNORMAL LOW (ref 3.5–5.0)
Alkaline Phosphatase: 86 U/L (ref 38–126)
Anion gap: 6 (ref 5–15)
BUN: 11 mg/dL (ref 8–23)
CO2: 31 mmol/L (ref 22–32)
Calcium: 9.4 mg/dL (ref 8.9–10.3)
Chloride: 102 mmol/L (ref 98–111)
Creatinine: 1 mg/dL (ref 0.44–1.00)
GFR, Estimated: 59 mL/min — ABNORMAL LOW (ref >60)
Glucose, Bld: 119 mg/dL — ABNORMAL HIGH (ref 70–99)
Potassium: 3.5 mmol/L (ref 3.5–5.1)
Sodium: 139 mmol/L (ref 135–145)
Total Bilirubin: 0.3 mg/dL (ref 0.3–1.2)
Total Protein: 7.4 g/dL (ref 6.5–8.1)

## 2023-07-17 LAB — CBC WITH DIFFERENTIAL (CANCER CENTER ONLY)
Abs Immature Granulocytes: 0.03 10*3/uL (ref 0.00–0.07)
Basophils Absolute: 0.1 10*3/uL (ref 0.0–0.1)
Basophils Relative: 1 %
Eosinophils Absolute: 0.3 10*3/uL (ref 0.0–0.5)
Eosinophils Relative: 5 %
HCT: 33.9 % — ABNORMAL LOW (ref 36.0–46.0)
Hemoglobin: 10.8 g/dL — ABNORMAL LOW (ref 12.0–15.0)
Immature Granulocytes: 0 %
Lymphocytes Relative: 16 %
Lymphs Abs: 1.1 10*3/uL (ref 0.7–4.0)
MCH: 32.1 pg (ref 26.0–34.0)
MCHC: 31.9 g/dL (ref 30.0–36.0)
MCV: 100.9 fL — ABNORMAL HIGH (ref 80.0–100.0)
Monocytes Absolute: 0.6 10*3/uL (ref 0.1–1.0)
Monocytes Relative: 9 %
Neutro Abs: 5 10*3/uL (ref 1.7–7.7)
Neutrophils Relative %: 69 %
Platelet Count: 304 10*3/uL (ref 150–400)
RBC: 3.36 MIL/uL — ABNORMAL LOW (ref 3.87–5.11)
RDW: 14.3 % (ref 11.5–15.5)
WBC Count: 7.2 10*3/uL (ref 4.0–10.5)
nRBC: 0 % (ref 0.0–0.2)

## 2023-07-17 MED ORDER — SODIUM CHLORIDE 0.9 % IV SOLN
200.0000 mg | Freq: Once | INTRAVENOUS | Status: AC
  Administered 2023-07-17: 200 mg via INTRAVENOUS
  Filled 2023-07-17: qty 200

## 2023-07-17 MED ORDER — SODIUM CHLORIDE 0.9% FLUSH
10.0000 mL | INTRAVENOUS | Status: DC | PRN
  Administered 2023-07-17: 10 mL

## 2023-07-17 MED ORDER — PROCHLORPERAZINE MALEATE 10 MG PO TABS: 10.0000 mg | ORAL_TABLET | Freq: Once | ORAL | Status: DC

## 2023-07-17 MED ORDER — SODIUM CHLORIDE 0.9 % IV SOLN: Freq: Once | INTRAVENOUS | Status: AC

## 2023-07-17 MED ORDER — SODIUM CHLORIDE 0.9% FLUSH
10.0000 mL | Freq: Once | INTRAVENOUS | Status: AC
  Administered 2023-07-17: 10 mL

## 2023-07-17 MED ORDER — HEPARIN SOD (PORK) LOCK FLUSH 100 UNIT/ML IV SOLN
500.0000 [IU] | Freq: Once | INTRAVENOUS | Status: AC | PRN
  Administered 2023-07-17: 500 [IU]

## 2023-07-17 NOTE — Patient Instructions (Signed)

## 2023-07-17 NOTE — Progress Notes (Signed)
[facility] [phone]   [phone]  OFFICE PROGRESS NOTE  [name], MD [address] [place] [address]  DIAGNOSIS: Stage IV (T2 a, N2, M1b) non-small cell lung cancer, adenocarcinoma presented with left upper lobe lung mass in addition to left hilar and precarinal metastatic adenopathy and small left lower lobe pulmonary nodule in addition to solitary left frontal brain metastasis diagnosed in July 2022.  Molecular studies by [employer] showed  Positive KRAS G12C mutation PD-L1 expression was less than 1%   PRIOR THERAPY:  1) Status post SRS to the solitary brain metastasis. 20 Treatment for the locally advanced disease in the chest with carboplatin for AUC of 2 and paclitaxel 45 Mg/M2.  Status post 6 cycles.  Last dose was given 07/25/2021.  CURRENT THERAPY: Systemic chemotherapy with carboplatin for AUC of 5, Alimta 500 Mg/M2 and Keytruda 200 Mg IV every 3 weeks.  First dose August 31, 2021.  Status post 31 cycles.  Starting from cycle #5 the patient will be on maintenance treatment with Alimta and Keytruda every 3 weeks.  Starting from cycle #30 she will be on single agent Keytruda 200 Mg IV every 3 weeks.  Alimta was discontinued secondary to intolerance.  INTERVAL HISTORY: Jill [name] 72 y.o. female returns the clinic today for follow-up visit.  The patient is feeling fine today with no concerning complaints except for recent chest congestion and cough productive of whitish sputum.  She has cold-like symptoms at this point.  She denied having any fever or chills.  She has no nausea, vomiting, diarrhea or constipation.  She has no headache or visual changes.  She has no recent weight loss or night sweats.  She denied having any chest pain or hemoptysis.  She has been tolerating her treatment with maintenance Keytruda fairly well.  She is here for evaluation before starting cycle #32.  MEDICAL HISTORY: Past Medical  History:  Diagnosis Date   Anxiety    ON PAXIL, XANAX   AVM (arteriovenous malformation) brain    s/p stent/coil   Blood transfusion    x 2   Cancer (HCC)    Left lung   COPD (chronic obstructive pulmonary disease) (HCC)    no inhaler, no oxygen   Coronary artery disease    Prior inferior MI with stent to RCA, s/p CABG in 2008   Depression    Dizziness    Dyspnea    with exertion, no oxygen   Fatigue    Foot injury 06/01/2017   right - RESOLVED, no longer an issue per patient 05/18/21   GERD (gastroesophageal reflux disease)    Headache(784.0)    UNRUPTURED CEREBRAL ANEURYSM   Hyperlipidemia    Hypertension    Hypothyroidism    Infected cyst of skin 09/18/2013   Left knee pain 06/05/2012   Lung mass    Left lung   Myocardial infarction (HCC)    Normal nuclear stress test Ju;y 2012   No ischemia. EF 70%; fixed defect involving septum, inferoseptal and inferior wall   Pain, dental 02/06/2017   Poor dental hygiene    Retroperitoneal bleeding    Following cardiac cath   Tobacco abuse    Urine discoloration 09/18/2013   Urine incontinence 07/06/2020   UTI (lower urinary tract infection) 10/16/2013    ALLERGIES:  is allergic to varenicline, ace inhibitors, prednisone, betalin 12 [vitamin b12], penicillins, and sulfa drugs cross reactors.  MEDICATIONS:  Current Outpatient  Medications  Medication Sig Dispense Refill   acetaminophen (TYLENOL) 500 MG tablet Take 500 mg by mouth every 6 (six) hours as needed.     albuterol (PROVENTIL) (2.5 MG/3ML) 0.083% nebulizer solution Take 3 mLs (2.5 mg total) by nebulization every 6 (six) hours as needed for wheezing or shortness of breath. 75 mL 12   albuterol (VENTOLIN HFA) 108 (90 Base) MCG/ACT inhaler Inhale 2 puffs into the lungs every 6 (six) hours as needed for wheezing or shortness of breath. 8 g 2   amLODipine (NORVASC) 10 MG tablet TAKE 1 TABLET BY MOUTH ONCE DAILY . APPOINTMENT REQUIRED FOR FUTURE REFILLS 90 tablet 1   aspirin  EC 81 MG tablet Take 1 tablet (81 mg total) by mouth daily. 90 tablet 2   atorvastatin (LIPITOR) 40 MG tablet Take 1 tablet (40 mg total) by mouth daily. 90 tablet 1   azithromycin (ZITHROMAX) 250 MG tablet Use as instructed 6 each 0   Cholecalciferol (VITAMIN D3) 250 MCG (10000 UT) capsule Take 10,000 mcg by mouth daily.     diphenhydrAMINE HCl (BENADRYL ALLERGY PO) Take 25 mg by mouth at bedtime as needed (for sleep).     folic acid (FOLVITE) 1 MG tablet Take 1 tablet (1 mg total) by mouth daily. 90 tablet 0   hydrocortisone cream 0.5 % Apply 1 Application topically 2 (two) times daily. As needed for itching. 30 g 0   levothyroxine (SYNTHROID) 150 MCG tablet TAKE 1 TABLET BY MOUTH EVERY DAY BEFORE BREAKFAST 90 tablet 0   lidocaine-prilocaine (EMLA) cream Apply 1 Application topically as needed. 30 g 2   losartan (COZAAR) 100 MG tablet TAKE 1 TABLET BY MOUTH EVERY DAY 90 tablet 1   metoprolol succinate (TOPROL-XL) 50 MG 24 hr tablet TAKE 1 TABLET BY MOUTH ONCE DAILY WITH MEALS OR IMMEDIATLEY AFTER A MEAL 90 tablet 1   mirtazapine (REMERON) 15 MG tablet TAKE 1 TABLET BY MOUTH EVERYDAY AT BEDTIME 90 tablet 1   Multiple Vitamins-Minerals (MULTIVITAMIN WITH MINERALS) tablet Take 1 tablet by mouth daily.     nitroGLYCERIN (NITROSTAT) 0.4 MG SL tablet Place 1 tablet (0.4 mg total) under the tongue every 5 (five) minutes as needed. For chest pain 25 tablet 6   prochlorperazine (COMPAZINE) 10 MG tablet TAKE 1 TABLET BY MOUTH EVERY 6 HOURS AS NEEDED NAUSEA AND VOMITING 30 tablet 0   Vitamin A 2400 MCG (8000 UT) TABS Take 2,400 mg by mouth daily.     No current facility-administered medications for this visit.    SURGICAL HISTORY:  Past Surgical History:  Procedure Laterality Date   CARDIAC CATHETERIZATION  01/02/2007   IT REVEALS MILD INFERIOR WALL HYPOKINESIS. THE EJECTION FRACTION IS AROUND 50%   COLONOSCOPY     CORONARY ARTERY BYPASS GRAFT  11/06/2006   LIMA to LAD, SVG to DX, SVG to LCX & SVG  to OM 1 & 2, and SVG to PD   CORONARY STENT PLACEMENT     Remote past stent to RCA   EYE SURGERY Right    cataracts removed   HEMORRHOID SURGERY  11/07/1987   IR IMAGING GUIDED PORT INSERTION  10/04/2022   UPPER GI ENDOSCOPY     VENTRICULOSTOMY  10/06/2011   Procedure: VENTRICULOSTOMY;  Surgeon: Carmela Hurt;  Location: [facility];  Service: Neurosurgery;  Laterality: Right;  Insertion of Ventriculostomy Catheter   VIDEO BRONCHOSCOPY WITH ENDOBRONCHIAL NAVIGATION Left 05/20/2021   Procedure: VIDEO BRONCHOSCOPY WITH ENDOBRONCHIAL NAVIGATION;  Surgeon: Josephine Igo, DO;  Location: [facility] OR;  Service: Pulmonary;  Laterality: Left;   VIDEO BRONCHOSCOPY WITH ENDOBRONCHIAL ULTRASOUND Bilateral 05/20/2021   Procedure: VIDEO BRONCHOSCOPY WITH ENDOBRONCHIAL ULTRASOUND;  Surgeon: Josephine Igo, DO;  Location: [facility] OR;  Service: Pulmonary;  Laterality: Bilateral;   WISDOM TOOTH EXTRACTION      REVIEW OF SYSTEMS:  Constitutional: positive for fatigue Eyes: negative Ears, nose, mouth, throat, and face: positive for nasal congestion Respiratory: positive for cough Cardiovascular: negative Gastrointestinal: negative Genitourinary:negative Integument/breast: negative Hematologic/lymphatic: negative Musculoskeletal:negative Neurological: negative Behavioral/Psych: negative Endocrine: negative Allergic/Immunologic: negative   PHYSICAL EXAMINATION: General appearance: alert, cooperative, fatigued, and no distress Head: Normocephalic, without obvious abnormality, atraumatic Neck: no adenopathy, no JVD, supple, symmetrical, trachea midline, and thyroid not enlarged, symmetric, no tenderness/mass/nodules Lymph nodes: Cervical, supraclavicular, and axillary nodes normal. Resp: clear to auscultation bilaterally Back: symmetric, no curvature. ROM normal. No CVA tenderness. Cardio: regular rate and rhythm, S1, S2 normal, no murmur, click, rub or gallop GI: soft, non-tender; bowel sounds normal; no  masses,  no organomegaly Extremities: extremities normal, atraumatic, no cyanosis or edema Neurologic: Alert and oriented X 3, normal strength and tone. Normal symmetric reflexes. Normal coordination and gait  ECOG PERFORMANCE STATUS: 1 - Symptomatic but completely ambulatory  Blood pressure 119/66, pulse 86, temperature 98.2 F (36.8 C), temperature source Oral, resp. rate 17, height 5\' 6"  (1.676 m), weight 137 lb 1.6 oz (62.2 kg), SpO2 97%.  LABORATORY DATA: Lab Results  Component Value Date   WBC 7.4 06/27/2023   HGB 11.0 (L) 06/27/2023   HCT 33.6 (L) 06/27/2023   MCV 101.8 (H) 06/27/2023   PLT 339 06/27/2023      Chemistry      Component Value Date/Time   NA 139 06/27/2023 1416   NA 141 04/12/2021 1356   K 3.3 (L) 06/27/2023 1416   CL 101 06/27/2023 1416   CO2 33 (H) 06/27/2023 1416   BUN 9 06/27/2023 1416   BUN 13 04/12/2021 1356   CREATININE 0.90 06/27/2023 1416   CREATININE 0.99 08/29/2016 1130      Component Value Date/Time   CALCIUM 9.8 06/27/2023 1416   ALKPHOS 94 06/27/2023 1416   AST 19 06/27/2023 1416   ALT 10 06/27/2023 1416   BILITOT 0.4 06/27/2023 1416       RADIOGRAPHIC STUDIES: CT CHEST ABDOMEN PELVIS W CONTRAST  Result Date: 07/16/2023 CLINICAL DATA:  Metastatic disease evaluation, lung cancer restaging * Tracking Code: BO * EXAM: CT CHEST, ABDOMEN, AND PELVIS WITH CONTRAST TECHNIQUE: Multidetector CT imaging of the chest, abdomen and pelvis was performed following the standard protocol during bolus administration of intravenous contrast. RADIATION DOSE REDUCTION: This exam was performed according to the departmental dose-optimization program which includes automated exposure control, adjustment of the mA and/or kV according to patient size and/or use of iterative reconstruction technique. CONTRAST:  OMNIPAQUE IOHEXOL 300 MG/ML  SOLN COMPARISON:  05/11/2023 FINDINGS: CT CHEST FINDINGS Cardiovascular: Aortic atherosclerosis. Right chest port  catheter. Normal heart size. Three-vessel coronary artery calcifications. No pericardial effusion. Mediastinum/Nodes: No enlarged mediastinal, hilar, or axillary lymph nodes. Small hiatal hernia. Thyroid gland, trachea, and esophagus demonstrate no significant findings. Lungs/Pleura: Small, loculated left pleural effusion, slightly diminished in volume. Severe emphysema. Unchanged post treatment appearance of a spiculated mass of the peripheral left upper lobe measuring 2.5 x 2.4 cm (series 6, image 62), with adjacent bandlike scarring and fibrosis which is somewhat increased compared to prior examination. Unchanged subsolid nodule of the lateral segment right middle lobe measuring 0.6 cm (  series 6, image 102) Musculoskeletal: No chest wall abnormality. No acute osseous findings. CT ABDOMEN PELVIS FINDINGS Hepatobiliary: No solid liver abnormality is seen. No gallstones, gallbladder wall thickening, or biliary dilatation. Pancreas: Unremarkable. No pancreatic ductal dilatation or surrounding inflammatory changes. Spleen: Normal in size without significant abnormality. Adrenals/Urinary Tract: Adrenal glands are unremarkable. Simple, benign renal cortical cysts, for which no further follow-up or characterization is required. Kidneys are otherwise normal, without renal calculi, solid lesion, or hydronephrosis. Bladder is unremarkable. Stomach/Bowel: Stomach is within normal limits. Appendix appears normal. No evidence of bowel wall thickening, distention, or inflammatory changes. Large burden of stool throughout the colon. Vascular/Lymphatic: Severe aortic atherosclerosis. No enlarged abdominal or pelvic lymph nodes. Reproductive: No mass or other abnormality. Other: No abdominal wall hernia or abnormality. No ascites. Musculoskeletal: No acute osseous findings. IMPRESSION: 1. Unchanged post treatment appearance of a spiculated mass of the peripheral left upper lobe. Adjacent bandlike scarring and fibrosis is somewhat  increased compared to prior examination, generally in keeping with expected evolution of radiation pneumonitis and fibrosis. 2. Unchanged subsolid nodule of the lateral segment right middle lobe measuring 0.6 cm. Continued attention on follow-up. 3. Small, loculated left pleural effusion, slightly diminished in volume. 4. No evidence of lymphadenopathy or metastatic disease in the abdomen or pelvis. 5. Coronary artery disease. Aortic Atherosclerosis (ICD10-I70.0) and Emphysema (ICD10-J43.9). Electronically Signed   By: Jearld Lesch M.D.   On: 07/16/2023 10:33    ASSESSMENT AND PLAN: This is a very pleasant 72 years old white female diagnosed with a stage IV (T2 a, N2, M1 B) non-small cell lung cancer, adenocarcinoma presented with left upper lobe lung mass in addition to left hilar and precarinal metastatic adenopathy as well as small left lower lobe pulmonary nodule and solitary left frontal brain metastasis diagnosed in July 2022.  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 The patient is status post SRS to the solitary brain metastasis. She underwent a course of concurrent chemoradiation to the locally advanced disease in the chest with carboplatin for AUC of 2 and paclitaxel 45 Mg/M2 status post 7 cycles.  The patient has been tolerating her treatment well with no concerning adverse effects. Her scan showed mild improvement of her disease with no concerning findings for progression. Technically the patient has a stage IV lung cancer with brain metastasis  The patient is currently undergoing systemic chemotherapy with carboplatin for AUC of 5, Alimta 500 Mg/M2 and Keytruda 200 Mg IV every 3 weeks status post 31 cycles.  Starting from cycle #5 the patient will be on maintenance treatment with Alimta and Keytruda every 3 weeks.  Starting from cycle #30 she will be on treatment with single agent Keytruda every 3 weeks.  Alimta was discontinued secondary to  increasing fatigue and weakness and intolerance. The patient has been tolerating this treatment well with no concerning adverse effects. She had repeat CT scan of the chest, abdomen and pelvis performed recently.  I personally and independently reviewed the scan and discussed the result with the patient today. Her scan showed no concerning findings for disease progression. I recommended for her to continue her current treatment with [facility] and she will proceed with cycle #32 today. I will see her back for follow-up visit in 3 weeks for evaluation before the next cycle of her treatment. Her last MRI of the brain that showed solitary residual left superior frontal gyrus metastatic site that stable around 5 mm.  She underwent SRS under the  care of Dr. Kathrynn Running on 05/23/2023. For the anemia, I advised her to take oral iron tablet every other day with vitamin C. She was advised to call immediately if she has any other concerning symptoms in the interval. The patient voices understanding of current disease status and treatment options and is in agreement with the current care plan. The total time spent in the appointment was 30 minutes.  All questions were answered. The patient knows to call the clinic with any problems, questions or concerns. We can certainly see the patient much sooner if necessary.  Disclaimer: This note was dictated with voice recognition software. Similar sounding words can inadvertently be transcribed and may not be corrected upon review.

## 2023-07-21 ENCOUNTER — Other Ambulatory Visit: Payer: Self-pay

## 2023-07-24 ENCOUNTER — Other Ambulatory Visit: Payer: Self-pay | Admitting: Internal Medicine

## 2023-07-24 DIAGNOSIS — E039 Hypothyroidism, unspecified: Secondary | ICD-10-CM

## 2023-07-24 DIAGNOSIS — C3490 Malignant neoplasm of unspecified part of unspecified bronchus or lung: Secondary | ICD-10-CM

## 2023-07-31 ENCOUNTER — Ambulatory Visit (INDEPENDENT_AMBULATORY_CARE_PROVIDER_SITE_OTHER): Payer: Medicare HMO | Admitting: Family Medicine

## 2023-07-31 ENCOUNTER — Encounter: Payer: Self-pay | Admitting: Family Medicine

## 2023-07-31 VITALS — BP 125/77 | HR 92 | Ht 66.0 in | Wt 133.6 lb

## 2023-07-31 DIAGNOSIS — I10 Essential (primary) hypertension: Secondary | ICD-10-CM

## 2023-07-31 DIAGNOSIS — E785 Hyperlipidemia, unspecified: Secondary | ICD-10-CM

## 2023-07-31 DIAGNOSIS — Z23 Encounter for immunization: Secondary | ICD-10-CM | POA: Diagnosis not present

## 2023-07-31 DIAGNOSIS — R5383 Other fatigue: Secondary | ICD-10-CM | POA: Diagnosis not present

## 2023-07-31 DIAGNOSIS — F32A Depression, unspecified: Secondary | ICD-10-CM

## 2023-07-31 DIAGNOSIS — K5900 Constipation, unspecified: Secondary | ICD-10-CM | POA: Diagnosis not present

## 2023-07-31 DIAGNOSIS — C3490 Malignant neoplasm of unspecified part of unspecified bronchus or lung: Secondary | ICD-10-CM | POA: Diagnosis not present

## 2023-07-31 DIAGNOSIS — Z1211 Encounter for screening for malignant neoplasm of colon: Secondary | ICD-10-CM

## 2023-07-31 DIAGNOSIS — E039 Hypothyroidism, unspecified: Secondary | ICD-10-CM | POA: Diagnosis not present

## 2023-07-31 DIAGNOSIS — C3412 Malignant neoplasm of upper lobe, left bronchus or lung: Secondary | ICD-10-CM

## 2023-07-31 DIAGNOSIS — C7931 Secondary malignant neoplasm of brain: Secondary | ICD-10-CM

## 2023-07-31 DIAGNOSIS — F419 Anxiety disorder, unspecified: Secondary | ICD-10-CM | POA: Diagnosis not present

## 2023-07-31 MED ORDER — ZOSTER VAC RECOMB ADJUVANTED 50 MCG/0.5ML IM SUSR: 0.5000 mL | Freq: Once | INTRAMUSCULAR | 0 refills | Status: AC

## 2023-07-31 MED ORDER — TETANUS-DIPHTH-ACELL PERTUSSIS 5-2.5-18.5 LF-MCG/0.5 IM SUSP: 0.5000 mL | Freq: Once | INTRAMUSCULAR | 0 refills | Status: AC

## 2023-07-31 MED ORDER — SENNA 8.6 MG PO TABS: 1.0000 | ORAL_TABLET | Freq: Two times a day (BID) | ORAL | 0 refills | Status: DC | PRN

## 2023-07-31 MED ORDER — ESCITALOPRAM OXALATE 10 MG PO TABS: 10.0000 mg | ORAL_TABLET | Freq: Every day | ORAL | 1 refills | Status: DC

## 2023-07-31 MED ORDER — CLONAZEPAM 0.5 MG PO TABS: 0.5000 mg | ORAL_TABLET | Freq: Two times a day (BID) | ORAL | 1 refills | Status: DC | PRN

## 2023-07-31 NOTE — Progress Notes (Signed)
SUBJECTIVE:   CHIEF COMPLAINT / HPI:   Depression      The patient presents with depression.  This is a recurrent problem.  The current episode started more than 1 year ago.   The onset quality is gradual.   The problem has been gradually worsening since onset.  Associated symptoms include fatigue, hopelessness, decreased interest, appetite change and sad.  Associated symptoms include no suicidal ideas.( She denies SI or HI, however, feels better off dead given her lung cancer diagnosis with Metastasis to her brain. She feels she is dying and this makes her nervous)     Exacerbated by: Medical problem.  Compliance with prior treatments: She self d/ced Remeron about 2 weeks ago and resumes her Lexapro 1 week ago. She wants to get back on Lexapro and something for her anxiety.  Past medical history includes hypothyroidism, chronic illness, anxiety and depression.   Anxiety Presents for follow-up visit. Symptoms include nervous/anxious behavior, palpitations and shortness of breath. Patient reports no dizziness or suicidal ideas. Symptoms occur constantly. The severity of symptoms is severe. The quality of sleep is fair.   Her past medical history is significant for depression.   COPD/Lung Ca: She coughs daily which is typical. She still smokes. Denies any SOB related to her COPD. However, with anxiety, she can become short winded sometimes. No chest tightness, but occasional chest pain beneath her left breast. She follows up regularly with oncology for her lung cancer.  Constipation: C/O slowed BM. Last BM was about 4 days ago. She sues Miralax daily with no improvement. No other GI symptoms.  HTN/HLD/Hypothyroidism:  Compliant with her meds  PERTINENT  PMH / PSH: PMhx reviewed  OBJECTIVE:   BP 125/77   Pulse 92   Ht 5\' 6"  (1.676 m)   Wt 133 lb 9.6 oz (60.6 kg)   SpO2 97%   BMI 21.56 kg/m   Physical Exam Vitals and nursing note reviewed.  Cardiovascular:     Rate and Rhythm:  Normal rate and regular rhythm.     Heart sounds: Normal heart sounds. No murmur heard. Pulmonary:     Effort: Pulmonary effort is normal. No respiratory distress.     Breath sounds: Normal breath sounds. No wheezing.  Abdominal:     General: There is no distension.      ASSESSMENT/PLAN:   Depression Acute exacerbation No current SI or HI Resume Lexapro at 10 mg every day D/C Remeron as she is currently not taking it F/U in 4 weeks for reassessment  Anxiety Exacerbated by chronic health problem - lung cancer diagnosis Emotional support provided She declined Therapy referral Klonopin started at low dose Continue Lexapro as prescribed F/U in 4 weeks for reassessment  Primary malignant neoplasm of lung with metastasis to brain [facility]) Close f/u with oncology  Primary adenocarcinoma of left upper lobe of lung (HCC) Stable  Hypothyroidism May be contributing to her depression symptoms with fatigue TSH checked today Will contact with result soon   Hyperlipidemia Stable on her current regimen  Hypertension Stable  Constipation Continue Miralax Senna added on I discussed need to update her colon cancer screening Cologuard escrbed F/U soon if there is no improvement   Fatigue: Chronic fatigue likely related to her chronic problems including hypothyroidism, Lung CA on Chemo, anemia of chronic disease She had a recent CBC 2 weeks ago which is at her baseline of 10 Consider recheck if symptoms worsen  Janit Pagan, MD [facility]

## 2023-07-31 NOTE — Assessment & Plan Note (Signed)
Stable on her current regimen.

## 2023-07-31 NOTE — Assessment & Plan Note (Signed)
May be contributing to her depression symptoms with fatigue TSH checked today Will contact with result soon

## 2023-07-31 NOTE — Patient Instructions (Signed)
Managing Depression, Adult Depression is a mental health condition that affects your thoughts, feelings, and actions. Being diagnosed with depression can bring you relief if you did not know why you have felt or behaved a certain way. It could also leave you feeling overwhelmed. Finding ways to manage your symptoms can help you feel more positive about your future. How to manage lifestyle changes Being depressed is difficult. Depression can increase the level of everyday stress. Stress can make depression symptoms worse. You may believe your symptoms cannot be managed or will never improve. However, there are many things you can try to help manage your symptoms. There is hope. Managing stress  Stress is your body's reaction to life changes and events, both good and bad. Stress can add to your feelings of depression. Learning to manage your stress can help lessen your feelings of depression. Try some of the following approaches to reducing your stress (stress reduction techniques): Listen to music that you enjoy and that inspires you. Try using a meditation app or take a meditation class. Develop a practice that helps you connect with your spiritual self. Walk in nature, pray, or go to a place of worship. Practice deep breathing. To do this, inhale slowly through your nose. Pause at the top of your inhale for a few seconds and then exhale slowly, letting yourself relax. Repeat this three or four times. Practice yoga to help relax and work your muscles. Choose a stress reduction technique that works for you. These techniques take time and practice to develop. Set aside 5-15 minutes a day to do them. Therapists can offer training in these techniques. Do these things to help manage stress: Keep a journal. Know your limits. Set healthy boundaries for yourself and others, such as saying "no" when you think something is too much. Pay attention to how you react to certain situations. You may not be able to  control everything, but you can change your reaction. Add humor to your life by watching funny movies or shows. Make time for activities that you enjoy and that relax you. Spend less time using electronics, especially at night before bed. The light from screens can make your brain think it is time to get up rather than go to bed.  Medicines Medicines, such as antidepressants, are often a part of treatment for depression. Talk with your pharmacist or health care provider about all the medicines, supplements, and herbal products that you take, their possible side effects, and what medicines and other products are safe to take together. Make sure to report any side effects you may have to your health care provider. Relationships Your health care provider may suggest family therapy, couples therapy, or individual therapy as part of your treatment. How to recognize changes Everyone responds differently to treatment for depression. As you recover from depression, you may start to: Have more interest in doing activities. Feel more hopeful. Have more energy. Eat a more regular amount of food. Have better mental focus. It is important to recognize if your depression is not getting better or is getting worse. The symptoms you had in the beginning may return, such as: Feeling tired. Eating too much or too little. Sleeping too much or too little. Feeling restless, agitated, or hopeless. Trouble focusing or making decisions. Having unexplained aches and pains. Feeling irritable, angry, or aggressive. If you or your family members notice these symptoms coming back, let your health care provider know right away. Follow these instructions at home: Activity Try to  get some form of exercise each day, such as walking. Try yoga, mindfulness, or other stress reduction techniques. Participate in group activities if you are able. Lifestyle Get enough sleep. Cut down on or stop using caffeine, tobacco,  alcohol, and any other harmful substances. Eat a healthy diet that includes plenty of vegetables, fruits, whole grains, low-fat dairy products, and lean protein. Limit foods that are high in solid fats, added sugar, or salt (sodium). General instructions Take over-the-counter and prescription medicines only as told by your health care provider. Keep all follow-up visits. It is important for your health care provider to check on your mood, behavior, and medicines. Your health care provider may need to make changes to your treatment. Where to find support Talking to others  Friends and family members can be sources of support and guidance. Talk to trusted friends or family members about your condition. Explain your symptoms and let them know that you are working with a health care provider to treat your depression. Tell friends and family how they can help. Finances Find mental health providers that fit with your financial situation. Talk with your health care provider if you are worried about access to food, housing, or medicine. Call your insurance company to learn about your co-pays and prescription plan. Where to find more information You can find support in your area from: Anxiety and Depression Association of America (ADAA): adaa.org Mental Health America: mentalhealthamerica.net [employer] on Mental Illness: nami.org Contact a health care provider if: You stop taking your antidepressant medicines, and you have any of these symptoms: Nausea. Headache. Light-headedness. Chills and body aches. Not being able to sleep (insomnia). You or your friends and family think your depression is getting worse. Get help right away if: You have thoughts of hurting yourself or others. Get help right away if you feel like you may hurt yourself or others, or have thoughts about taking your own life. Go to your nearest emergency room or: Call 911. Call the National Suicide Prevention Lifeline at  (561)568-8485 or 988. This is open 24 hours a day. Text the Crisis Text Line at (215)453-3993. This information is not intended to replace advice given to you by your health care provider. Make sure you discuss any questions you have with your health care provider. Document Revised: 02/28/2022 Document Reviewed: 02/28/2022 Elsevier Patient Education  2024 [employer].

## 2023-07-31 NOTE — Assessment & Plan Note (Signed)
Stable

## 2023-07-31 NOTE — Assessment & Plan Note (Signed)
Continue Miralax Senna added on I discussed need to update her colon cancer screening Cologuard ordered F/U soon if there is no improvement

## 2023-07-31 NOTE — Assessment & Plan Note (Signed)
Acute exacerbation No current SI or HI Resume Lexapro at 10 mg every day D/C Remeron as she is currently not taking it F/U in 4 weeks for reassessment

## 2023-07-31 NOTE — Assessment & Plan Note (Signed)
Exacerbated by chronic health problem - lung cancer diagnosis Emotional support provided She declined Therapy referral Klonopin started at low dose Continue Lexapro as prescribed F/U in 4 weeks for reassessment

## 2023-07-31 NOTE — Assessment & Plan Note (Signed)
Close f/u with oncology

## 2023-08-01 ENCOUNTER — Telehealth: Payer: Self-pay | Admitting: Family Medicine

## 2023-08-01 LAB — TSH: TSH: 19.4 u[IU]/mL — ABNORMAL HIGH (ref 0.450–4.500)

## 2023-08-01 MED ORDER — LEVOTHYROXINE SODIUM 175 MCG PO TABS: 175.0000 ug | ORAL_TABLET | Freq: Every day | ORAL | 1 refills | Status: DC

## 2023-08-01 NOTE — Assessment & Plan Note (Signed)
Chronic fatigue likely related to her chronic problems including hypothyroidism, Lung CA on Chemo, anemia of chronic disease She had a recent CBC 2 weeks ago which is at her baseline of 10 Consider recheck if symptoms worsen

## 2023-08-01 NOTE — Telephone Encounter (Signed)
Elevated TSH was discussed despite compliance with her Synthroid 150 mcg QD.  Plan to increase her Synthroid to 175 mcg QD and repeat her TSH in 4-8 weeks. She did well overnight on her Klonopin but was a bit dizzy this morning. Hydration is encouraged, and consider cutting her dose to half. I discussed her most recent hemoglobin check, which is at her baseline. She'll return sooner to re-check if her fatigue and/or dizziness worsens. She denies any active bleeding.

## 2023-08-07 ENCOUNTER — Inpatient Hospital Stay: Payer: Medicare HMO

## 2023-08-07 ENCOUNTER — Inpatient Hospital Stay: Payer: Medicare HMO | Attending: Internal Medicine

## 2023-08-07 ENCOUNTER — Other Ambulatory Visit: Payer: Self-pay

## 2023-08-07 ENCOUNTER — Inpatient Hospital Stay (HOSPITAL_BASED_OUTPATIENT_CLINIC_OR_DEPARTMENT_OTHER): Payer: Medicare HMO | Admitting: Internal Medicine

## 2023-08-07 DIAGNOSIS — Z888 Allergy status to other drugs, medicaments and biological substances status: Secondary | ICD-10-CM | POA: Diagnosis not present

## 2023-08-07 DIAGNOSIS — C3412 Malignant neoplasm of upper lobe, left bronchus or lung: Secondary | ICD-10-CM

## 2023-08-07 DIAGNOSIS — I700 Atherosclerosis of aorta: Secondary | ICD-10-CM | POA: Insufficient documentation

## 2023-08-07 DIAGNOSIS — D649 Anemia, unspecified: Secondary | ICD-10-CM | POA: Diagnosis not present

## 2023-08-07 DIAGNOSIS — I252 Old myocardial infarction: Secondary | ICD-10-CM | POA: Insufficient documentation

## 2023-08-07 DIAGNOSIS — R110 Nausea: Secondary | ICD-10-CM | POA: Diagnosis not present

## 2023-08-07 DIAGNOSIS — I10 Essential (primary) hypertension: Secondary | ICD-10-CM | POA: Insufficient documentation

## 2023-08-07 DIAGNOSIS — F419 Anxiety disorder, unspecified: Secondary | ICD-10-CM | POA: Diagnosis not present

## 2023-08-07 DIAGNOSIS — J90 Pleural effusion, not elsewhere classified: Secondary | ICD-10-CM | POA: Insufficient documentation

## 2023-08-07 DIAGNOSIS — R519 Headache, unspecified: Secondary | ICD-10-CM | POA: Diagnosis not present

## 2023-08-07 DIAGNOSIS — Z923 Personal history of irradiation: Secondary | ICD-10-CM | POA: Diagnosis not present

## 2023-08-07 DIAGNOSIS — Z9221 Personal history of antineoplastic chemotherapy: Secondary | ICD-10-CM | POA: Diagnosis not present

## 2023-08-07 DIAGNOSIS — K449 Diaphragmatic hernia without obstruction or gangrene: Secondary | ICD-10-CM | POA: Insufficient documentation

## 2023-08-07 DIAGNOSIS — Z5112 Encounter for antineoplastic immunotherapy: Secondary | ICD-10-CM | POA: Diagnosis not present

## 2023-08-07 DIAGNOSIS — C7931 Secondary malignant neoplasm of brain: Secondary | ICD-10-CM | POA: Diagnosis not present

## 2023-08-07 DIAGNOSIS — Z8719 Personal history of other diseases of the digestive system: Secondary | ICD-10-CM | POA: Diagnosis not present

## 2023-08-07 DIAGNOSIS — E785 Hyperlipidemia, unspecified: Secondary | ICD-10-CM | POA: Diagnosis not present

## 2023-08-07 DIAGNOSIS — Z87440 Personal history of urinary (tract) infections: Secondary | ICD-10-CM | POA: Insufficient documentation

## 2023-08-07 DIAGNOSIS — Z882 Allergy status to sulfonamides status: Secondary | ICD-10-CM | POA: Diagnosis not present

## 2023-08-07 DIAGNOSIS — E039 Hypothyroidism, unspecified: Secondary | ICD-10-CM | POA: Insufficient documentation

## 2023-08-07 DIAGNOSIS — Z880 Allergy status to penicillin: Secondary | ICD-10-CM | POA: Insufficient documentation

## 2023-08-07 DIAGNOSIS — J439 Emphysema, unspecified: Secondary | ICD-10-CM | POA: Diagnosis not present

## 2023-08-07 DIAGNOSIS — Z79899 Other long term (current) drug therapy: Secondary | ICD-10-CM | POA: Insufficient documentation

## 2023-08-07 DIAGNOSIS — C3490 Malignant neoplasm of unspecified part of unspecified bronchus or lung: Secondary | ICD-10-CM

## 2023-08-07 DIAGNOSIS — Z95828 Presence of other vascular implants and grafts: Secondary | ICD-10-CM

## 2023-08-07 LAB — CBC WITH DIFFERENTIAL (CANCER CENTER ONLY)
Abs Immature Granulocytes: 0.01 10*3/uL (ref 0.00–0.07)
Basophils Absolute: 0.1 10*3/uL (ref 0.0–0.1)
Basophils Relative: 1 %
Eosinophils Absolute: 0.1 10*3/uL (ref 0.0–0.5)
Eosinophils Relative: 2 %
HCT: 34.1 % — ABNORMAL LOW (ref 36.0–46.0)
Hemoglobin: 11 g/dL — ABNORMAL LOW (ref 12.0–15.0)
Immature Granulocytes: 0 %
Lymphocytes Relative: 17 %
Lymphs Abs: 1.4 10*3/uL (ref 0.7–4.0)
MCH: 32 pg (ref 26.0–34.0)
MCHC: 32.3 g/dL (ref 30.0–36.0)
MCV: 99.1 fL (ref 80.0–100.0)
Monocytes Absolute: 0.6 10*3/uL (ref 0.1–1.0)
Monocytes Relative: 8 %
Neutro Abs: 5.9 10*3/uL (ref 1.7–7.7)
Neutrophils Relative %: 72 %
Platelet Count: 327 10*3/uL (ref 150–400)
RBC: 3.44 MIL/uL — ABNORMAL LOW (ref 3.87–5.11)
RDW: 14 % (ref 11.5–15.5)
WBC Count: 8.1 10*3/uL (ref 4.0–10.5)
nRBC: 0 % (ref 0.0–0.2)

## 2023-08-07 LAB — CMP (CANCER CENTER ONLY)
ALT: 9 U/L (ref 0–44)
AST: 15 U/L (ref 15–41)
Albumin: 3.4 g/dL — ABNORMAL LOW (ref 3.5–5.0)
Alkaline Phosphatase: 75 U/L (ref 38–126)
Anion gap: 7 (ref 5–15)
BUN: 10 mg/dL (ref 8–23)
CO2: 30 mmol/L (ref 22–32)
Calcium: 10 mg/dL (ref 8.9–10.3)
Chloride: 100 mmol/L (ref 98–111)
Creatinine: 0.9 mg/dL (ref 0.44–1.00)
GFR, Estimated: >60 mL/min (ref >60)
Glucose, Bld: 141 mg/dL — ABNORMAL HIGH (ref 70–99)
Potassium: 3.5 mmol/L (ref 3.5–5.1)
Sodium: 137 mmol/L (ref 135–145)
Total Bilirubin: 0.3 mg/dL (ref 0.3–1.2)
Total Protein: 7.5 g/dL (ref 6.5–8.1)

## 2023-08-07 MED ORDER — PROCHLORPERAZINE MALEATE 10 MG PO TABS: 10.0000 mg | ORAL_TABLET | Freq: Once | ORAL | Status: DC

## 2023-08-07 MED ORDER — SODIUM CHLORIDE 0.9% FLUSH
10.0000 mL | Freq: Once | INTRAVENOUS | Status: AC
  Administered 2023-08-07: 10 mL

## 2023-08-07 MED ORDER — SODIUM CHLORIDE 0.9 % IV SOLN
200.0000 mg | Freq: Once | INTRAVENOUS | Status: AC
  Administered 2023-08-07: 200 mg via INTRAVENOUS
  Filled 2023-08-07: qty 200

## 2023-08-07 MED ORDER — SODIUM CHLORIDE 0.9 % IV SOLN: Freq: Once | INTRAVENOUS | Status: AC

## 2023-08-07 MED ORDER — SODIUM CHLORIDE 0.9% FLUSH
10.0000 mL | INTRAVENOUS | Status: DC | PRN
  Administered 2023-08-07: 10 mL

## 2023-08-07 NOTE — Patient Instructions (Signed)
[facility]  Discharge Instructions: Thank you for choosing [facility] to provide your oncology and hematology care.   If you have a lab appointment with the Cancer Center, please go directly to the Cancer Center and check in at the registration area.   Wear comfortable clothing and clothing appropriate for easy access to any Portacath or PICC line.   We strive to give you quality time with your provider. You may need to reschedule your appointment if you arrive late (15 or more minutes).  Arriving late affects you and other patients whose appointments are after yours.  Also, if you miss three or more appointments without notifying the office, you may be dismissed from the clinic at the provider's discretion.      For prescription refill requests, have your pharmacy contact our office and allow 72 hours for refills to be completed.    Today you received the following chemotherapy and/or immunotherapy agents keytruda      To help prevent nausea and vomiting after your treatment, we encourage you to take your nausea medication as directed.  BELOW ARE SYMPTOMS THAT SHOULD BE REPORTED IMMEDIATELY: *FEVER GREATER THAN 100.4 F (38 C) OR HIGHER *CHILLS OR SWEATING *NAUSEA AND VOMITING THAT IS NOT CONTROLLED WITH YOUR NAUSEA MEDICATION *UNUSUAL SHORTNESS OF BREATH *UNUSUAL BRUISING OR BLEEDING *URINARY PROBLEMS (pain or burning when urinating, or frequent urination) *BOWEL PROBLEMS (unusual diarrhea, constipation, pain near the anus) TENDERNESS IN MOUTH AND THROAT WITH OR WITHOUT PRESENCE OF ULCERS (sore throat, sores in mouth, or a toothache) UNUSUAL RASH, SWELLING OR PAIN  UNUSUAL VAGINAL DISCHARGE OR ITCHING   Items with * indicate a potential emergency and should be followed up as soon as possible or go to the Emergency Department if any problems should occur.  Please show the CHEMOTHERAPY ALERT CARD or IMMUNOTHERAPY ALERT CARD at  check-in to the Emergency Department and triage nurse.  Should you have questions after your visit or need to cancel or reschedule your appointment, please contact [facility]  Dept: [phone]  and follow the prompts.  Office hours are 8:00 a.m. to 4:30 p.m. Monday - Friday. Please note that voicemails left after 4:00 p.m. may not be returned until the following business day.  We are closed weekends and major holidays. You have access to a nurse at all times for urgent questions. Please call the main number to the clinic Dept: [phone] and follow the prompts.   For any non-urgent questions, you may also contact your provider using MyChart. We now offer e-Visits for anyone 18 and older to request care online for non-urgent symptoms. For details visit [email or web address].   Also download the MyChart app! Go to the app store, search "MyChart", open the app, select , and log in with your MyChart username and password.   

## 2023-08-07 NOTE — Patient Instructions (Signed)

## 2023-08-07 NOTE — Progress Notes (Signed)
[facility] [phone]   [phone]  OFFICE PROGRESS NOTE  [name], MD [address] [place] [address]  DIAGNOSIS: Stage IV (T2 a, N2, M1b) non-small cell lung cancer, adenocarcinoma presented with left upper lobe lung mass in addition to left hilar and precarinal metastatic adenopathy and small left lower lobe pulmonary nodule in addition to solitary left frontal brain metastasis diagnosed in July 2022.  Molecular studies by [employer] showed  Positive KRAS G12C mutation PD-L1 expression was less than 1%   PRIOR THERAPY:  1) Status post SRS to the solitary brain metastasis. 20 Treatment for the locally advanced disease in the chest with carboplatin for AUC of 2 and paclitaxel 45 Mg/M2.  Status post 6 cycles.  Last dose was given 07/25/2021.  CURRENT THERAPY: Systemic chemotherapy with carboplatin for AUC of 5, Alimta 500 Mg/M2 and Keytruda 200 Mg IV every 3 weeks.  First dose August 31, 2021.  Status post 32  cycles.  Starting from cycle #5 the patient will be on maintenance treatment with Alimta and Keytruda every 3 weeks.  Starting from cycle #30 she will be on single agent Keytruda 200 Mg IV every 3 weeks.  Alimta was discontinued secondary to intolerance.  INTERVAL HISTORY: Jill [name] 72 y.o. female returns to the clinic today for follow-up visit.Discussed the use of AI scribe software for clinical note transcription with the patient, who gave verbal consent to proceed.  History of Present Illness   The patient, a 72 year old individual with a history of stage four non-small cell lung cancer, presents with recent changes in thyroid function. She reports feeling tired, irritable, headachy, weak, and nauseated, with additional symptoms of itchiness.  The patient also reports coughing up clear sputum, but denies any hemoptysis. She has not experienced any recent weight loss and her weight remains stable from the  previous visit.  Regarding her cancer treatment, the patient has completed 32 cycles of chemotherapy, initially with carboplatin, Alimta, and Keytruda, and currently on Keytruda alone. She has tolerated the treatment well and expresses a preference for the initial chemotherapy and steroid regimen.  The patient also has a history of mild anemia, which remains stable. Despite the recent thyroid-related symptoms, she reports feeling slightly better at the time of the consultation.       MEDICAL HISTORY: Past Medical History:  Diagnosis Date   Anxiety    ON PAXIL, XANAX   AVM (arteriovenous malformation) brain    s/p stent/coil   Blood transfusion    x 2   Cancer (HCC)    Left lung   COPD (chronic obstructive pulmonary disease) (HCC)    no inhaler, no oxygen   Coronary artery disease    Prior inferior MI with stent to RCA, s/p CABG in 2008   Depression    Dizziness    Dyspnea    with exertion, no oxygen   Fatigue    Foot injury 06/01/2017   right - RESOLVED, no longer an issue per patient 05/18/21   GERD (gastroesophageal reflux disease)    Headache(784.0)    UNRUPTURED CEREBRAL ANEURYSM   Hyperlipidemia    Hypertension    Hypothyroidism    Infected cyst of skin 09/18/2013   Left knee pain 06/05/2012   Lung mass    Left lung   Myocardial infarction (HCC)    Normal nuclear stress test Ju;y 2012   No ischemia. EF 70%; fixed defect involving septum, inferoseptal and  inferior wall   Pain, dental 02/06/2017   Poor dental hygiene    Retroperitoneal bleeding    Following cardiac cath   Tobacco abuse    Urine discoloration 09/18/2013   Urine incontinence 07/06/2020   UTI (lower urinary tract infection) 10/16/2013    ALLERGIES:  is allergic to varenicline, ace inhibitors, prednisone, betalin 12 [vitamin b12], penicillins, and sulfa drugs cross reactors.  MEDICATIONS:  Current Outpatient Medications  Medication Sig Dispense Refill   acetaminophen (TYLENOL) 500 MG tablet Take  500 mg by mouth every 6 (six) hours as needed.     albuterol (PROVENTIL) (2.5 MG/3ML) 0.083% nebulizer solution Take 3 mLs (2.5 mg total) by nebulization every 6 (six) hours as needed for wheezing or shortness of breath. (Patient not taking: Reported on 07/31/2023) 75 mL 12   albuterol (VENTOLIN HFA) 108 (90 Base) MCG/ACT inhaler Inhale 2 puffs into the lungs every 6 (six) hours as needed for wheezing or shortness of breath. (Patient not taking: Reported on 07/31/2023) 8 g 2   amLODipine (NORVASC) 10 MG tablet TAKE 1 TABLET BY MOUTH ONCE DAILY . APPOINTMENT REQUIRED FOR FUTURE REFILLS 90 tablet 1   aspirin EC 81 MG tablet Take 1 tablet (81 mg total) by mouth daily. 90 tablet 2   atorvastatin (LIPITOR) 40 MG tablet Take 1 tablet (40 mg total) by mouth daily. 90 tablet 1   Cholecalciferol (VITAMIN D3) 250 MCG (10000 UT) capsule Take 10,000 mcg by mouth daily.     clonazePAM (KLONOPIN) 0.5 MG tablet Take 1 tablet (0.5 mg total) by mouth 2 (two) times daily as needed for anxiety. 50 tablet 1   diphenhydrAMINE HCl (BENADRYL ALLERGY PO) Take 25 mg by mouth at bedtime as needed (for sleep). (Patient not taking: Reported on 07/31/2023)     escitalopram (LEXAPRO) 10 MG tablet Take 1 tablet (10 mg total) by mouth daily. 30 tablet 1   folic acid (FOLVITE) 1 MG tablet TAKE 1 TABLET BY MOUTH EVERY DAY 90 tablet 0   hydrocortisone cream 0.5 % Apply 1 Application topically 2 (two) times daily. As needed for itching. (Patient not taking: Reported on 07/31/2023) 30 g 0   levothyroxine (SYNTHROID) 175 MCG tablet Take 1 tablet (175 mcg total) by mouth daily before breakfast. 30 tablet 1   lidocaine-prilocaine (EMLA) cream Apply 1 Application topically as needed. (Patient not taking: Reported on 07/31/2023) 30 g 2   losartan (COZAAR) 100 MG tablet TAKE 1 TABLET BY MOUTH EVERY DAY 90 tablet 1   metoprolol succinate (TOPROL-XL) 50 MG 24 hr tablet TAKE 1 TABLET BY MOUTH ONCE DAILY WITH MEALS OR IMMEDIATLEY AFTER A MEAL 90 tablet  1   Multiple Vitamins-Minerals (MULTIVITAMIN WITH MINERALS) tablet Take 1 tablet by mouth daily.     nitroGLYCERIN (NITROSTAT) 0.4 MG SL tablet Place 1 tablet (0.4 mg total) under the tongue every 5 (five) minutes as needed. For chest pain (Patient not taking: Reported on 07/31/2023) 25 tablet 6   prochlorperazine (COMPAZINE) 10 MG tablet TAKE 1 TABLET BY MOUTH EVERY 6 HOURS AS NEEDED NAUSEA AND VOMITING (Patient not taking: Reported on 07/31/2023) 30 tablet 0   senna (SENOKOT) 8.6 MG TABS tablet Take 1 tablet (8.6 mg total) by mouth 2 (two) times daily as needed for mild constipation. 60 tablet 0   Vitamin A 2400 MCG (8000 UT) TABS Take 2,400 mg by mouth daily.     No current facility-administered medications for this visit.    SURGICAL HISTORY:  Past Surgical History:  Procedure Laterality Date   CARDIAC CATHETERIZATION  01/02/2007   IT REVEALS MILD INFERIOR WALL HYPOKINESIS. THE EJECTION FRACTION IS AROUND 50%   COLONOSCOPY     CORONARY ARTERY BYPASS GRAFT  11/06/2006   LIMA to LAD, SVG to DX, SVG to LCX & SVG to OM 1 & 2, and SVG to PD   CORONARY STENT PLACEMENT     Remote past stent to RCA   EYE SURGERY Right    cataracts removed   HEMORRHOID SURGERY  11/07/1987   IR IMAGING GUIDED PORT INSERTION  10/04/2022   UPPER GI ENDOSCOPY     VENTRICULOSTOMY  10/06/2011   Procedure: VENTRICULOSTOMY;  Surgeon: Carmela Hurt;  Location: [facility];  Service: Neurosurgery;  Laterality: Right;  Insertion of Ventriculostomy Catheter   VIDEO BRONCHOSCOPY WITH ENDOBRONCHIAL NAVIGATION Left 05/20/2021   Procedure: VIDEO BRONCHOSCOPY WITH ENDOBRONCHIAL NAVIGATION;  Surgeon: Josephine Igo, DO;  Location: [facility] OR;  Service: Pulmonary;  Laterality: Left;   VIDEO BRONCHOSCOPY WITH ENDOBRONCHIAL ULTRASOUND Bilateral 05/20/2021   Procedure: VIDEO BRONCHOSCOPY WITH ENDOBRONCHIAL ULTRASOUND;  Surgeon: Josephine Igo, DO;  Location: [facility] OR;  Service: Pulmonary;  Laterality: Bilateral;   WISDOM TOOTH  EXTRACTION      REVIEW OF SYSTEMS:  A comprehensive review of systems was negative except for: Constitutional: positive for fatigue Respiratory: positive for cough Musculoskeletal: positive for muscle weakness   PHYSICAL EXAMINATION: General appearance: alert, cooperative, fatigued, and no distress Head: Normocephalic, without obvious abnormality, atraumatic Neck: no adenopathy, no JVD, supple, symmetrical, trachea midline, and thyroid not enlarged, symmetric, no tenderness/mass/nodules Lymph nodes: Cervical, supraclavicular, and axillary nodes normal. Resp: clear to auscultation bilaterally Back: symmetric, no curvature. ROM normal. No CVA tenderness. Cardio: regular rate and rhythm, S1, S2 normal, no murmur, click, rub or gallop GI: soft, non-tender; bowel sounds normal; no masses,  no organomegaly Extremities: extremities normal, atraumatic, no cyanosis or edema  ECOG PERFORMANCE STATUS: 1 - Symptomatic but completely ambulatory  Blood pressure (!) 121/59, pulse 84, temperature 98.1 F (36.7 C), temperature source Oral, resp. rate 18, height 5\' 6"  (1.676 m), weight 134 lb 1.6 oz (60.8 kg), SpO2 (!) 88%.  LABORATORY DATA: Lab Results  Component Value Date   WBC 8.1 08/07/2023   HGB 11.0 (L) 08/07/2023   HCT 34.1 (L) 08/07/2023   MCV 99.1 08/07/2023   PLT 327 08/07/2023      Chemistry      Component Value Date/Time   NA 139 07/17/2023 1048   NA 141 04/12/2021 1356   K 3.5 07/17/2023 1048   CL 102 07/17/2023 1048   CO2 31 07/17/2023 1048   BUN 11 07/17/2023 1048   BUN 13 04/12/2021 1356   CREATININE 1.00 07/17/2023 1048   CREATININE 0.99 08/29/2016 1130      Component Value Date/Time   CALCIUM 9.4 07/17/2023 1048   ALKPHOS 86 07/17/2023 1048   AST 20 07/17/2023 1048   ALT 8 07/17/2023 1048   BILITOT 0.3 07/17/2023 1048       RADIOGRAPHIC STUDIES: CT CHEST ABDOMEN PELVIS W CONTRAST  Result Date: 07/16/2023 CLINICAL DATA:  Metastatic disease evaluation, lung  cancer restaging * Tracking Code: BO * EXAM: CT CHEST, ABDOMEN, AND PELVIS WITH CONTRAST TECHNIQUE: Multidetector CT imaging of the chest, abdomen and pelvis was performed following the standard protocol during bolus administration of intravenous contrast. RADIATION DOSE REDUCTION: This exam was performed according to the departmental dose-optimization program which includes automated exposure control, adjustment of the mA and/or kV according to  patient size and/or use of iterative reconstruction technique. CONTRAST:  OMNIPAQUE IOHEXOL 300 MG/ML  SOLN COMPARISON:  05/11/2023 FINDINGS: CT CHEST FINDINGS Cardiovascular: Aortic atherosclerosis. Right chest port catheter. Normal heart size. Three-vessel coronary artery calcifications. No pericardial effusion. Mediastinum/Nodes: No enlarged mediastinal, hilar, or axillary lymph nodes. Small hiatal hernia. Thyroid gland, trachea, and esophagus demonstrate no significant findings. Lungs/Pleura: Small, loculated left pleural effusion, slightly diminished in volume. Severe emphysema. Unchanged post treatment appearance of a spiculated mass of the peripheral left upper lobe measuring 2.5 x 2.4 cm (series 6, image 62), with adjacent bandlike scarring and fibrosis which is somewhat increased compared to prior examination. Unchanged subsolid nodule of the lateral segment right middle lobe measuring 0.6 cm (series 6, image 102) Musculoskeletal: No chest wall abnormality. No acute osseous findings. CT ABDOMEN PELVIS FINDINGS Hepatobiliary: No solid liver abnormality is seen. No gallstones, gallbladder wall thickening, or biliary dilatation. Pancreas: Unremarkable. No pancreatic ductal dilatation or surrounding inflammatory changes. Spleen: Normal in size without significant abnormality. Adrenals/Urinary Tract: Adrenal glands are unremarkable. Simple, benign renal cortical cysts, for which no further follow-up or characterization is required. Kidneys are otherwise normal,  without renal calculi, solid lesion, or hydronephrosis. Bladder is unremarkable. Stomach/Bowel: Stomach is within normal limits. Appendix appears normal. No evidence of bowel wall thickening, distention, or inflammatory changes. Large burden of stool throughout the colon. Vascular/Lymphatic: Severe aortic atherosclerosis. No enlarged abdominal or pelvic lymph nodes. Reproductive: No mass or other abnormality. Other: No abdominal wall hernia or abnormality. No ascites. Musculoskeletal: No acute osseous findings. IMPRESSION: 1. Unchanged post treatment appearance of a spiculated mass of the peripheral left upper lobe. Adjacent bandlike scarring and fibrosis is somewhat increased compared to prior examination, generally in keeping with expected evolution of radiation pneumonitis and fibrosis. 2. Unchanged subsolid nodule of the lateral segment right middle lobe measuring 0.6 cm. Continued attention on follow-up. 3. Small, loculated left pleural effusion, slightly diminished in volume. 4. No evidence of lymphadenopathy or metastatic disease in the abdomen or pelvis. 5. Coronary artery disease. Aortic Atherosclerosis (ICD10-I70.0) and Emphysema (ICD10-J43.9). Electronically Signed   By: Jearld Lesch M.D.   On: 07/16/2023 10:33    ASSESSMENT AND PLAN: This is a very pleasant 73 years old white female diagnosed with a stage IV (T2 a, N2, M1 B) non-small cell lung cancer, adenocarcinoma presented with left upper lobe lung mass in addition to left hilar and precarinal metastatic adenopathy as well as small left lower lobe pulmonary nodule and solitary left frontal brain metastasis diagnosed in July 2022.  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 The patient is status post SRS to the solitary brain metastasis. She underwent a course of concurrent chemoradiation to the locally advanced disease in the chest with carboplatin for AUC of 2 and paclitaxel 45 Mg/M2 status post 7  cycles.  The patient has been tolerating her treatment well with no concerning adverse effects. Her scan showed mild improvement of her disease with no concerning findings for progression. Technically the patient has a stage IV lung cancer with brain metastasis  The patient is currently undergoing systemic chemotherapy with carboplatin for AUC of 5, Alimta 500 Mg/M2 and Keytruda 200 Mg IV every 3 weeks status post 32 cycles.  Starting from cycle #5 the patient will be on maintenance treatment with Alimta and Keytruda every 3 weeks.  Starting from cycle #30 she will be on treatment with single agent Keytruda every 3 weeks.  Alimta was discontinued secondary  to increasing fatigue and weakness and intolerance.  She has been tolerating her treatment with single agent Keytruda fairly well. Assessment and Plan    Stage IV Non-Small Cell Lung Cancer Completed 32 cycles of treatment with Carboplatin, Alimta, and Keytruda, currently on Keytruda alone. Plan to continue treatment until cycle 35, then monitor with scans. -Administer cycle 33 of Keytruda today. -Plan to hold treatment after cycle 35 and monitor with scans. -If disease progression noted on scans, consider resuming treatment.   Hypothyroidism Elevated TSH (19) with symptoms of fatigue, irritability, headache, weakness, and nausea. Levothyroxine dose increased to 175 mcg daily by primary care physician. -Continue Levothyroxine 175 mcg daily.  Mild Anemia Stable, no change in weight or significant symptoms. -Continue to monitor.  Cough with clear sputum No hemoptysis or other concerning symptoms. -Continue to monitor.     Her last MRI of the brain that showed solitary residual left superior frontal gyrus metastatic site that stable around 5 mm.  She underwent SRS under the care of Dr. Kathrynn Running on 05/23/2023. For the anemia, I advised her to take oral iron tablet every other day with vitamin C. The patient was advised to call immediately if  she has any other concerning symptoms in the interval. The patient voices understanding of current disease status and treatment options and is in agreement with the current care plan. The total time spent in the appointment was 20 minutes.  All questions were answered. The patient knows to call the clinic with any problems, questions or concerns. We can certainly see the patient much sooner if necessary.  Disclaimer: This note was dictated with voice recognition software. Similar sounding words can inadvertently be transcribed and may not be corrected upon review.

## 2023-08-10 ENCOUNTER — Other Ambulatory Visit: Payer: Self-pay | Admitting: Family Medicine

## 2023-08-10 DIAGNOSIS — Z1212 Encounter for screening for malignant neoplasm of rectum: Secondary | ICD-10-CM

## 2023-08-10 DIAGNOSIS — Z1211 Encounter for screening for malignant neoplasm of colon: Secondary | ICD-10-CM

## 2023-08-15 DIAGNOSIS — J441 Chronic obstructive pulmonary disease with (acute) exacerbation: Secondary | ICD-10-CM | POA: Diagnosis not present

## 2023-08-16 ENCOUNTER — Other Ambulatory Visit: Payer: Self-pay | Admitting: Internal Medicine

## 2023-08-20 ENCOUNTER — Other Ambulatory Visit: Payer: Self-pay | Admitting: Family Medicine

## 2023-08-20 ENCOUNTER — Other Ambulatory Visit: Payer: Self-pay | Admitting: Internal Medicine

## 2023-08-20 DIAGNOSIS — E785 Hyperlipidemia, unspecified: Secondary | ICD-10-CM

## 2023-08-20 DIAGNOSIS — C3490 Malignant neoplasm of unspecified part of unspecified bronchus or lung: Secondary | ICD-10-CM

## 2023-08-20 DIAGNOSIS — E039 Hypothyroidism, unspecified: Secondary | ICD-10-CM

## 2023-08-22 ENCOUNTER — Other Ambulatory Visit: Payer: Self-pay | Admitting: Family Medicine

## 2023-08-22 ENCOUNTER — Encounter: Payer: Self-pay | Admitting: Internal Medicine

## 2023-08-22 NOTE — Telephone Encounter (Signed)
TC

## 2023-08-23 ENCOUNTER — Other Ambulatory Visit: Payer: Self-pay | Admitting: Family Medicine

## 2023-08-28 ENCOUNTER — Telehealth: Payer: Medicare HMO | Admitting: Family Medicine

## 2023-08-28 ENCOUNTER — Encounter: Payer: Self-pay | Admitting: Family Medicine

## 2023-08-28 ENCOUNTER — Telehealth: Payer: Self-pay | Admitting: Medical Oncology

## 2023-08-28 DIAGNOSIS — E039 Hypothyroidism, unspecified: Secondary | ICD-10-CM

## 2023-08-28 DIAGNOSIS — J0191 Acute recurrent sinusitis, unspecified: Secondary | ICD-10-CM

## 2023-08-28 DIAGNOSIS — J449 Chronic obstructive pulmonary disease, unspecified: Secondary | ICD-10-CM | POA: Diagnosis not present

## 2023-08-28 DIAGNOSIS — J329 Chronic sinusitis, unspecified: Secondary | ICD-10-CM | POA: Insufficient documentation

## 2023-08-28 DIAGNOSIS — R269 Unspecified abnormalities of gait and mobility: Secondary | ICD-10-CM

## 2023-08-28 DIAGNOSIS — F32A Depression, unspecified: Secondary | ICD-10-CM

## 2023-08-28 DIAGNOSIS — F17210 Nicotine dependence, cigarettes, uncomplicated: Secondary | ICD-10-CM | POA: Diagnosis not present

## 2023-08-28 MED ORDER — AZITHROMYCIN 250 MG PO TABS: ORAL_TABLET | ORAL | 0 refills | Status: DC

## 2023-08-28 NOTE — Assessment & Plan Note (Addendum)
Stable on her current regimen I did advise her that Rande Lawman can mess up her thyroid function We will recheck her TSH in Nov

## 2023-08-28 NOTE — Progress Notes (Signed)
[facility] Telemedicine Visit  Patient consented to have virtual visit and was identified by name and date of birth. Method of visit: Telephone  Encounter participants: Patient: Jill [name] - located at Home  Provider: Janit Pagan - located at [facility] office Others (if applicable): N/A  Chief Complaint: Follow up  HPI: Depression: She is doing well on Lexapro 10 mg QD. Yesterday, she was able to go out in her pouch for 3 hours, which is the first time she has done this in a very long time.   Hypothyroidism: Compliant with Synthroid 175 mcg every day. She worries if her Keytruda injection will affect her thyroid function. She two more injection series to complete. Last dose in nov she says.  Cough: C/O, which started about six days ago. She was exposed to her sick grandchildren. She feels her cough is improving. However, her sinus symptoms is acting up with her cough. She denies SOB or wheezing. She stopped using her Anoro COPD inhaler as she did not like how it made her feel. She uses albuterol as needed.   Off Balance/Weakness: She c/o of abnormal feeling in her head when walking. She stated that it feels like she is off balance, but with no dizziness. She denies fall, LOC or tremors. Her appetite is poor and she has lost a few pound. She has been out of her ensure nutrition supplement.  ROS: per HPI  Pertinent PMHx: PMHx reviewed  Exam:  There were no vitals taken for this visit.  Respiratory: She could complete full sentences without difficulty. She coughed on and off during this telephone encounter  Assessment/Plan:  Depression Stable on her current regimen  Hypothyroidism Stable on her current regimen I did advise her that Rande Lawman can mess up her thyroid function We will recheck her TSH in Nov  COPD (chronic obstructive pulmonary disease) (HCC) Does not want to be on controller medication Wishes to continue albuterol as needed Will  continue to work on smoking cessation Take OTC cough regimen as needed  I discussed no need for A/B at this time as this sounds like viral illness given hx of exposure She insisted that her cough and congestion is in her head and might be trigering her sinus congestion - This could also be viral Zpak given for bacterial bronchitis She will f/u in person for reassessment if there is no improvement  Gait difficulty Described as being off balance She denies speech impairment or facial asymmetry - She has no concerns for stroke ?? Deconditioning from her chronic disease vs weakness from poor appetite She endorsed weight loss which could contribute to her weakness I encouraged her to resume her Ensure QID with meal F/U soon if symptoms persist - she will need blood work at the time She will also benefit from home PT at some point Return sooner or proceed to the ED if her symptoms worsens She will follow up as recommended    Time spent during visit with patient: 18 minutes

## 2023-08-28 NOTE — Telephone Encounter (Signed)
TSH 5x ULN-  Jill [name] said her PCP wants to know if she can skip her next two cycles of immunotherapy. I told her to talk to [facility] tomorrow at her appt.

## 2023-08-28 NOTE — Assessment & Plan Note (Addendum)
Does not want to be on controller medication Wishes to continue albuterol as needed Will continue to work on smoking cessation Take OTC cough regimen as needed  I discussed no need for A/B at this time as this sounds like viral illness given hx of exposure She insisted that her cough and congestion is in her head and might be trigering her sinus congestion - This could also be viral Zpak given for bacterial bronchitis She will f/u in person for reassessment if there is no improvement

## 2023-08-28 NOTE — Patient Instructions (Signed)

## 2023-08-28 NOTE — Assessment & Plan Note (Signed)
Described as being off balance She denies speech impairment or facial asymmetry - She has no concerns for stroke ?? Deconditioning from her chronic disease vs weakness from poor appetite She endorsed weight loss which could contribute to her weakness I encouraged her to resume her Ensure QID with meal F/U soon if symptoms persist - she will need blood work at the time She will also benefit from home PT at some point Return sooner or proceed to the ED if her symptoms worsens She will follow up as recommended

## 2023-08-28 NOTE — Assessment & Plan Note (Signed)
Stable on her current regimen. 

## 2023-08-29 ENCOUNTER — Inpatient Hospital Stay: Payer: Medicare HMO | Admitting: Internal Medicine

## 2023-08-29 ENCOUNTER — Telehealth: Payer: Self-pay | Admitting: Medical Oncology

## 2023-08-29 ENCOUNTER — Inpatient Hospital Stay: Payer: Medicare HMO

## 2023-08-29 ENCOUNTER — Other Ambulatory Visit: Payer: Self-pay

## 2023-08-29 NOTE — Telephone Encounter (Signed)
"  Very bad sore throat "-she cancelled appt today.

## 2023-08-31 ENCOUNTER — Telehealth: Payer: Self-pay | Admitting: Medical Oncology

## 2023-08-31 NOTE — Telephone Encounter (Signed)
Per Dr Arbutus Ped I LVM  "She can skip her treatment if this is her wishes.  Would not discontinue treatment for elevated TSH which is manage the hypothyroidism and continue with the immunotherapy as planned.  Thank you ".

## 2023-09-12 ENCOUNTER — Encounter: Payer: Self-pay | Admitting: Radiation Oncology

## 2023-09-15 DIAGNOSIS — J441 Chronic obstructive pulmonary disease with (acute) exacerbation: Secondary | ICD-10-CM | POA: Diagnosis not present

## 2023-09-16 NOTE — Progress Notes (Deleted)
[facility] OFFICE PROGRESS NOTE  Jill Eland, MD [address] [place] [address]  DIAGNOSIS: Stage IV (T2 a, N2, M1b) non-small cell lung cancer, adenocarcinoma presented with left upper lobe lung mass in addition to left hilar and precarinal metastatic adenopathy and small left lower lobe pulmonary nodule in addition to solitary left frontal brain metastasis diagnosed in July 2022.   Molecular studies by [employer] showed  Positive KRAS G12C mutation PD-L1 expression was less than 1%  PRIOR THERAPY: 1) Status post SRS to the solitary brain metastasis under the care of Dr. Kathrynn Running. Completed on 06/23/21 2)  Treatment for the locally advanced disease in the chest with carboplatin for AUC of 2 and paclitaxel 45 Mg/M2.  Status post 6 cycles.  Last dose was given 07/25/2021. 3) SRS to the additional metastatic brain lesions on 09/28/22  CURRENT THERAPY: Systemic chemotherapy with carboplatin for AUC of 5, Alimta 500 Mg/M2 and Keytruda 200 Mg IV every 3 weeks.  First dose August 31, 2021.  Status post 33 cycles.  Starting from cycle #5 the patient will be on maintenance treatment with Alimta and Keytruda every 3 weeks.  Starting from cycle #30 she will be on single agent Keytruda 200 Mg IV every 3 weeks.  Alimta was discontinued secondary to intolerance.   INTERVAL HISTORY: Jill [name] 72 y.o. female returns to the clinic today for follow-up visit unaccompanied.  The patient was last seen by Dr. Arbutus Ped on 08/07/2023.  At that point time she had some worsening hypothyroidism and she is wondering if she can skip her last 2 cycles with treatment.  She is status post 43 cycles.  She then canceled her appointment due to a sore throat.  Overall she is feeling ***today.  She reports stable fatigue and generalized weakness poor appetite?  Weight?  Aversion to certain smells.  She tries to eat small frequent meals.  If she experiences nausea she takes her antiemetic.   Denies any vomiting, diarrhea, or constipation.  She has night sweats secondary to warm sleeping environment.  Her breathing is stable.  She has chronic dyspnea on exertion.  She also struggles with intermittent cough depending on sinus drainage.  Denies any chest pain or hemoptysis.  She has dry skin but denies any rashes.  Denies any headache or visual changes.  She is here today for evaluation repeat blood work before undergoing cycle #33   MEDICAL HISTORY: Past Medical History:  Diagnosis Date   Anxiety    ON PAXIL, XANAX   AVM (arteriovenous malformation) brain    s/p stent/coil   Blood transfusion    x 2   Cancer (HCC)    Left lung   COPD (chronic obstructive pulmonary disease) (HCC)    no inhaler, no oxygen   Coronary artery disease    Prior inferior MI with stent to RCA, s/p CABG in 2008   Depression    Dizziness    Dyspnea    with exertion, no oxygen   Fatigue    Foot injury 06/01/2017   right - RESOLVED, no longer an issue per patient 05/18/21   GERD (gastroesophageal reflux disease)    Headache(784.0)    UNRUPTURED CEREBRAL ANEURYSM   Hyperlipidemia    Hypertension    Hypothyroidism    Infected cyst of skin 09/18/2013   Left knee pain 06/05/2012   Lung mass    Left lung   Myocardial infarction (HCC)    Normal nuclear stress test Ju;y  2012   No ischemia. EF 70%; fixed defect involving septum, inferoseptal and inferior wall   Pain, dental 02/06/2017   Poor dental hygiene    Retroperitoneal bleeding    Following cardiac cath   Tobacco abuse    Urine discoloration 09/18/2013   Urine incontinence 07/06/2020   UTI (lower urinary tract infection) 10/16/2013    ALLERGIES:  is allergic to varenicline, ace inhibitors, prednisone, betalin 12 [vitamin b12], penicillins, and sulfa drugs cross reactors.  MEDICATIONS:  Current Outpatient Medications  Medication Sig Dispense Refill   acetaminophen (TYLENOL) 500 MG tablet Take 500 mg by mouth every 6 (six) hours as  needed.     albuterol (PROVENTIL) (2.5 MG/3ML) 0.083% nebulizer solution Take 3 mLs (2.5 mg total) by nebulization every 6 (six) hours as needed for wheezing or shortness of breath. (Patient not taking: Reported on 07/31/2023) 75 mL 12   albuterol (VENTOLIN HFA) 108 (90 Base) MCG/ACT inhaler Inhale 2 puffs into the lungs every 6 (six) hours as needed for wheezing or shortness of breath. (Patient not taking: Reported on 07/31/2023) 8 g 2   amLODipine (NORVASC) 10 MG tablet TAKE 1 TABLET BY MOUTH ONCE DAILY . APPOINTMENT REQUIRED FOR FUTURE REFILLS 90 tablet 1   aspirin EC 81 MG tablet Take 1 tablet (81 mg total) by mouth daily. 90 tablet 2   atorvastatin (LIPITOR) 40 MG tablet TAKE 1 TABLET BY MOUTH EVERY DAY 90 tablet 1   azithromycin (ZITHROMAX Z-PAK) 250 MG tablet Take two tablets on day one and then one tablet daily x 4 day. 6 each 0   Cholecalciferol (VITAMIN D3) 250 MCG (10000 UT) capsule Take 10,000 mcg by mouth daily.     clonazePAM (KLONOPIN) 0.5 MG tablet Take 1 tablet (0.5 mg total) by mouth 2 (two) times daily as needed for anxiety. 50 tablet 1   diphenhydrAMINE HCl (BENADRYL ALLERGY PO) Take 25 mg by mouth at bedtime as needed (for sleep). (Patient not taking: Reported on 07/31/2023)     escitalopram (LEXAPRO) 10 MG tablet TAKE 1 TABLET BY MOUTH EVERY DAY 90 tablet 1   folic acid (FOLVITE) 1 MG tablet TAKE 1 TABLET BY MOUTH EVERY DAY 90 tablet 0   hydrocortisone cream 0.5 % Apply 1 Application topically 2 (two) times daily. As needed for itching. (Patient not taking: Reported on 07/31/2023) 30 g 0   levothyroxine (SYNTHROID) 175 MCG tablet TAKE 1 TABLET BY MOUTH DAILY BEFORE BREAKFAST. 30 tablet 1   lidocaine-prilocaine (EMLA) cream Apply 1 Application topically as needed. (Patient not taking: Reported on 07/31/2023) 30 g 2   losartan (COZAAR) 100 MG tablet TAKE 1 TABLET BY MOUTH EVERY DAY 90 tablet 1   metoprolol succinate (TOPROL-XL) 50 MG 24 hr tablet TAKE 1 TABLET BY MOUTH ONCE DAILY WITH  MEALS OR IMMEDIATLEY AFTER A MEAL 90 tablet 1   Multiple Vitamins-Minerals (MULTIVITAMIN WITH MINERALS) tablet Take 1 tablet by mouth daily.     nitroGLYCERIN (NITROSTAT) 0.4 MG SL tablet Place 1 tablet (0.4 mg total) under the tongue every 5 (five) minutes as needed. For chest pain (Patient not taking: Reported on 07/31/2023) 25 tablet 6   prochlorperazine (COMPAZINE) 10 MG tablet TAKE 1 TABLET BY MOUTH EVERY 6 HOURS AS NEEDED NAUSEA AND VOMITING 30 tablet 0   senna (SENOKOT) 8.6 MG TABS tablet Take 1 tablet (8.6 mg total) by mouth 2 (two) times daily as needed for mild constipation. 60 tablet 0   Vitamin A 2400 MCG (8000 UT) TABS Take 2,400  mg by mouth daily.     No current facility-administered medications for this visit.    SURGICAL HISTORY:  Past Surgical History:  Procedure Laterality Date   CARDIAC CATHETERIZATION  01/02/2007   IT REVEALS MILD INFERIOR WALL HYPOKINESIS. THE EJECTION FRACTION IS AROUND 50%   COLONOSCOPY     CORONARY ARTERY BYPASS GRAFT  11/06/2006   LIMA to LAD, SVG to DX, SVG to LCX & SVG to OM 1 & 2, and SVG to PD   CORONARY STENT PLACEMENT     Remote past stent to RCA   EYE SURGERY Right    cataracts removed   HEMORRHOID SURGERY  11/07/1987   IR IMAGING GUIDED PORT INSERTION  10/04/2022   UPPER GI ENDOSCOPY     VENTRICULOSTOMY  10/06/2011   Procedure: VENTRICULOSTOMY;  Surgeon: Carmela Hurt;  Location: [facility];  Service: Neurosurgery;  Laterality: Right;  Insertion of Ventriculostomy Catheter   VIDEO BRONCHOSCOPY WITH ENDOBRONCHIAL NAVIGATION Left 05/20/2021   Procedure: VIDEO BRONCHOSCOPY WITH ENDOBRONCHIAL NAVIGATION;  Surgeon: Josephine Igo, DO;  Location: [facility] OR;  Service: Pulmonary;  Laterality: Left;   VIDEO BRONCHOSCOPY WITH ENDOBRONCHIAL ULTRASOUND Bilateral 05/20/2021   Procedure: VIDEO BRONCHOSCOPY WITH ENDOBRONCHIAL ULTRASOUND;  Surgeon: Josephine Igo, DO;  Location: [facility] OR;  Service: Pulmonary;  Laterality: Bilateral;   WISDOM TOOTH  EXTRACTION      REVIEW OF SYSTEMS:   Review of Systems  Constitutional: Negative for appetite change, chills, fatigue, fever and unexpected weight change.  HENT:   Negative for mouth sores, nosebleeds, sore throat and trouble swallowing.   Eyes: Negative for eye problems and icterus.  Respiratory: Negative for cough, hemoptysis, shortness of breath and wheezing.   Cardiovascular: Negative for chest pain and leg swelling.  Gastrointestinal: Negative for abdominal pain, constipation, diarrhea, nausea and vomiting.  Genitourinary: Negative for bladder incontinence, difficulty urinating, dysuria, frequency and hematuria.   Musculoskeletal: Negative for back pain, gait problem, neck pain and neck stiffness.  Skin: Negative for itching and rash.  Neurological: Negative for dizziness, extremity weakness, gait problem, headaches, light-headedness and seizures.  Hematological: Negative for adenopathy. Does not bruise/bleed easily.  Psychiatric/Behavioral: Negative for confusion, depression and sleep disturbance. The patient is not nervous/anxious.     PHYSICAL EXAMINATION:  There were no vitals taken for this visit.  ECOG PERFORMANCE STATUS: {CHL ONC ECOG [id number]  Physical Exam  Constitutional: Oriented to person, place, and time and well-developed, well-nourished, and in no distress. No distress.  HENT:  Head: Normocephalic and atraumatic.  Mouth/Throat: Oropharynx is clear and moist. No oropharyngeal exudate.  Eyes: Conjunctivae are normal. Right eye exhibits no discharge. Left eye exhibits no discharge. No scleral icterus.  Neck: Normal range of motion. Neck supple.  Cardiovascular: Normal rate, regular rhythm, normal heart sounds and intact distal pulses.   Pulmonary/Chest: Effort normal and breath sounds normal. No respiratory distress. No wheezes. No rales.  Abdominal: Soft. Bowel sounds are normal. Exhibits no distension and no mass. There is no tenderness.  Musculoskeletal:  Normal range of motion. Exhibits no edema.  Lymphadenopathy:    No cervical adenopathy.  Neurological: Alert and oriented to person, place, and time. Exhibits normal muscle tone. Gait normal. Coordination normal.  Skin: Skin is warm and dry. No rash noted. Not diaphoretic. No erythema. No pallor.  Psychiatric: Mood, memory and judgment normal.  Vitals reviewed.  LABORATORY DATA: Lab Results  Component Value Date   WBC 8.1 08/07/2023   HGB 11.0 (L) 08/07/2023   HCT 34.1 (L)  08/07/2023   MCV 99.1 08/07/2023   PLT 327 08/07/2023      Chemistry      Component Value Date/Time   NA 137 08/07/2023 1323   NA 141 04/12/2021 1356   K 3.5 08/07/2023 1323   CL 100 08/07/2023 1323   CO2 30 08/07/2023 1323   BUN 10 08/07/2023 1323   BUN 13 04/12/2021 1356   CREATININE 0.90 08/07/2023 1323   CREATININE 0.99 08/29/2016 1130      Component Value Date/Time   CALCIUM 10.0 08/07/2023 1323   ALKPHOS 75 08/07/2023 1323   AST 15 08/07/2023 1323   ALT 9 08/07/2023 1323   BILITOT 0.3 08/07/2023 1323       RADIOGRAPHIC STUDIES:  No results found.   ASSESSMENT/PLAN:  This is a very pleasant 72 year old Caucasian female diagnosed with stage IV (T2 a, N2, M1 B) non-small cell lung cancer, adenocarcinoma.  She presented with a left upper lobe lung mass in addition to left hilar and precarinal metastatic adenopathy as well as a small left lower lobe pulmonary nodule and a solitary left frontal brain metastasis.  This was diagnosed in July 2022.  Her molecular studies show that she is positive for K-ras G12C mutation and her PD-L1 expression is negative.    She completed SRS to the solitary brain metastasis under the care of Dr. Kathrynn Running which was completed on 06/23/2021.  She had a repeat brain MRI performed on 09/15/2021 which shows a new 1 mm and 3 mm metastatic brain lesions.  She received SRS on 09/28/2021.    She completed concurrent chemoradiation for the locally advanced disease in the chest  with carboplatin for an AUC of 2 and paclitaxel 45 mg per metered squared.  She is status post 6 cycles.  She had been tolerating treatment well without any concerning adverse side effects.   The patient is currently undergoing systemic chemotherapy with carboplatin AUC 5, Alimta 500 mg per metered squared, Keytruda 200 mg IV every 3 weeks.  She is status post 33 cycles.  She started maintenance alimta and Keytruda starting from cycle #5.  Alimta was ultimately discontinued secondary to intolerance with fatigue and poor appetite.  She is currently on single agent Keytruda 200 mg IV every 3 weeks.   She developed hypothyroidism. She is wondering if she can proceed on observation without pursing the last two cycles of treatment. Reviewed with Dr. Arbutus Ped who ***.    Labs were reviewed. Recommend she proceed with cycle #34 is scheduled.***   ***.  I explained to the patient that we will arrange for restaging CT scan before her next cycle of treatment.     We will see her back for labs and a follow up visit in 3 weeks for repeat blood work before undergoing cycle #35***Vs skip.    ***Regarding her her poor appetite and fatigue, we discussed small frequent meals, salt water rinses, and Biotene.  I also strongly encouraged the patient to increase her activity as tolerated as some of her fatigue and generalized weakness may be secondary to deconditioning.  Overall though, she does feel better on single agent immunotherapy than when she was on Keytruda and chemotherapy together.***   The patient was advised to call immediately if she has any concerning symptoms in the interval. The patient voices understanding of current disease status and treatment options and is in agreement with the current care plan. All questions were answered. The patient knows to call the clinic with any problems, questions  or concerns. We can certainly see the patient much sooner if necessary       No orders of the defined  types were placed in this encounter.    I spent {CHL ONC TIME VISIT - [id number]:[id number]} counseling the patient face to face. The total time spent in the appointment was {CHL ONC TIME VISIT - [id number]}.  Racquel Arkin L Dallon Dacosta, PA-C 09/16/23

## 2023-09-18 ENCOUNTER — Inpatient Hospital Stay: Admission: RE | Admit: 2023-09-18 | Payer: Medicare HMO | Source: Ambulatory Visit

## 2023-09-18 ENCOUNTER — Telehealth: Payer: Self-pay | Admitting: Radiation Therapy

## 2023-09-18 NOTE — Telephone Encounter (Signed)
I received a voicemail from Ms. Majkowski requesting I reschedule her brain MRI. She is sick, been up with nausea, committing and diarrhea throughout the night. The scan has been pushed out to 11/27 and the telephone follow-up with Q[place]man Livings has also been rescheduled.   Jalene Mullet R.T.(R)(T) Radiation Special Procedures Navigator

## 2023-09-19 ENCOUNTER — Inpatient Hospital Stay: Payer: Medicare HMO

## 2023-09-19 ENCOUNTER — Encounter: Payer: Self-pay | Admitting: Internal Medicine

## 2023-09-19 ENCOUNTER — Inpatient Hospital Stay: Payer: Medicare HMO | Admitting: Physician Assistant

## 2023-09-19 ENCOUNTER — Telehealth: Payer: Self-pay | Admitting: Medical Oncology

## 2023-09-19 ENCOUNTER — Telehealth: Payer: Self-pay | Admitting: Internal Medicine

## 2023-09-19 ENCOUNTER — Other Ambulatory Visit: Payer: Self-pay | Admitting: Physician Assistant

## 2023-09-19 DIAGNOSIS — C3412 Malignant neoplasm of upper lobe, left bronchus or lung: Secondary | ICD-10-CM

## 2023-09-19 NOTE — Telephone Encounter (Addendum)
Cancelled appt today . Pt and family sick with vomiting, diarrhea.  Taking compazine ,but throwing it back up as we;; as water. I instructed pt to call if symptoms worsen. Appts cancelled.

## 2023-09-19 NOTE — Progress Notes (Signed)
Patient is sick.  Appts canceled for today.  Schedule message sent.

## 2023-09-20 ENCOUNTER — Other Ambulatory Visit: Payer: Self-pay

## 2023-09-23 ENCOUNTER — Other Ambulatory Visit: Payer: Self-pay

## 2023-09-24 ENCOUNTER — Ambulatory Visit: Payer: Medicare HMO | Admitting: Radiology

## 2023-09-25 ENCOUNTER — Inpatient Hospital Stay (HOSPITAL_BASED_OUTPATIENT_CLINIC_OR_DEPARTMENT_OTHER): Payer: Medicare HMO | Admitting: Internal Medicine

## 2023-09-25 ENCOUNTER — Inpatient Hospital Stay: Payer: Medicare HMO | Attending: Internal Medicine

## 2023-09-25 ENCOUNTER — Inpatient Hospital Stay: Payer: Medicare HMO

## 2023-09-25 VITALS — BP 121/71 | HR 95 | Temp 98.0°F | Resp 15

## 2023-09-25 VITALS — BP 133/65 | HR 92

## 2023-09-25 DIAGNOSIS — J449 Chronic obstructive pulmonary disease, unspecified: Secondary | ICD-10-CM | POA: Insufficient documentation

## 2023-09-25 DIAGNOSIS — I252 Old myocardial infarction: Secondary | ICD-10-CM | POA: Insufficient documentation

## 2023-09-25 DIAGNOSIS — E785 Hyperlipidemia, unspecified: Secondary | ICD-10-CM | POA: Insufficient documentation

## 2023-09-25 DIAGNOSIS — M25551 Pain in right hip: Secondary | ICD-10-CM | POA: Diagnosis not present

## 2023-09-25 DIAGNOSIS — C3412 Malignant neoplasm of upper lobe, left bronchus or lung: Secondary | ICD-10-CM

## 2023-09-25 DIAGNOSIS — Z882 Allergy status to sulfonamides status: Secondary | ICD-10-CM | POA: Insufficient documentation

## 2023-09-25 DIAGNOSIS — F419 Anxiety disorder, unspecified: Secondary | ICD-10-CM | POA: Insufficient documentation

## 2023-09-25 DIAGNOSIS — Z87440 Personal history of urinary (tract) infections: Secondary | ICD-10-CM | POA: Insufficient documentation

## 2023-09-25 DIAGNOSIS — I10 Essential (primary) hypertension: Secondary | ICD-10-CM | POA: Diagnosis not present

## 2023-09-25 DIAGNOSIS — E039 Hypothyroidism, unspecified: Secondary | ICD-10-CM | POA: Insufficient documentation

## 2023-09-25 DIAGNOSIS — Z79899 Other long term (current) drug therapy: Secondary | ICD-10-CM | POA: Insufficient documentation

## 2023-09-25 DIAGNOSIS — Z5112 Encounter for antineoplastic immunotherapy: Secondary | ICD-10-CM | POA: Diagnosis not present

## 2023-09-25 DIAGNOSIS — Z923 Personal history of irradiation: Secondary | ICD-10-CM | POA: Diagnosis not present

## 2023-09-25 DIAGNOSIS — C3490 Malignant neoplasm of unspecified part of unspecified bronchus or lung: Secondary | ICD-10-CM

## 2023-09-25 DIAGNOSIS — C7931 Secondary malignant neoplasm of brain: Secondary | ICD-10-CM | POA: Insufficient documentation

## 2023-09-25 DIAGNOSIS — E876 Hypokalemia: Secondary | ICD-10-CM | POA: Diagnosis not present

## 2023-09-25 DIAGNOSIS — E860 Dehydration: Secondary | ICD-10-CM

## 2023-09-25 DIAGNOSIS — Z888 Allergy status to other drugs, medicaments and biological substances status: Secondary | ICD-10-CM | POA: Insufficient documentation

## 2023-09-25 DIAGNOSIS — A084 Viral intestinal infection, unspecified: Secondary | ICD-10-CM | POA: Insufficient documentation

## 2023-09-25 DIAGNOSIS — R634 Abnormal weight loss: Secondary | ICD-10-CM | POA: Insufficient documentation

## 2023-09-25 DIAGNOSIS — Z880 Allergy status to penicillin: Secondary | ICD-10-CM | POA: Insufficient documentation

## 2023-09-25 DIAGNOSIS — Z95828 Presence of other vascular implants and grafts: Secondary | ICD-10-CM

## 2023-09-25 DIAGNOSIS — Z8719 Personal history of other diseases of the digestive system: Secondary | ICD-10-CM | POA: Diagnosis not present

## 2023-09-25 LAB — CBC WITH DIFFERENTIAL (CANCER CENTER ONLY)
Abs Immature Granulocytes: 0.02 10*3/uL (ref 0.00–0.07)
Basophils Absolute: 0 10*3/uL (ref 0.0–0.1)
Basophils Relative: 1 %
Eosinophils Absolute: 0.1 10*3/uL (ref 0.0–0.5)
Eosinophils Relative: 2 %
HCT: 33.3 % — ABNORMAL LOW (ref 36.0–46.0)
Hemoglobin: 10.3 g/dL — ABNORMAL LOW (ref 12.0–15.0)
Immature Granulocytes: 0 %
Lymphocytes Relative: 10 %
Lymphs Abs: 0.9 10*3/uL (ref 0.7–4.0)
MCH: 29.2 pg (ref 26.0–34.0)
MCHC: 30.9 g/dL (ref 30.0–36.0)
MCV: 94.3 fL (ref 80.0–100.0)
Monocytes Absolute: 0.6 10*3/uL (ref 0.1–1.0)
Monocytes Relative: 7 %
Neutro Abs: 7 10*3/uL (ref 1.7–7.7)
Neutrophils Relative %: 80 %
Platelet Count: 450 10*3/uL — ABNORMAL HIGH (ref 150–400)
RBC: 3.53 MIL/uL — ABNORMAL LOW (ref 3.87–5.11)
RDW: 14.7 % (ref 11.5–15.5)
WBC Count: 8.7 10*3/uL (ref 4.0–10.5)
nRBC: 0 % (ref 0.0–0.2)

## 2023-09-25 LAB — CMP (CANCER CENTER ONLY)
ALT: 9 U/L (ref 0–44)
AST: 14 U/L — ABNORMAL LOW (ref 15–41)
Albumin: 3.2 g/dL — ABNORMAL LOW (ref 3.5–5.0)
Alkaline Phosphatase: 75 U/L (ref 38–126)
Anion gap: 7 (ref 5–15)
BUN: 10 mg/dL (ref 8–23)
CO2: 28 mmol/L (ref 22–32)
Calcium: 9.2 mg/dL (ref 8.9–10.3)
Chloride: 102 mmol/L (ref 98–111)
Creatinine: 0.75 mg/dL (ref 0.44–1.00)
GFR, Estimated: >60 mL/min (ref >60)
Glucose, Bld: 129 mg/dL — ABNORMAL HIGH (ref 70–99)
Potassium: 3.3 mmol/L — ABNORMAL LOW (ref 3.5–5.1)
Sodium: 137 mmol/L (ref 135–145)
Total Bilirubin: 0.3 mg/dL (ref <1.2)
Total Protein: 7.1 g/dL (ref 6.5–8.1)

## 2023-09-25 MED ORDER — SODIUM CHLORIDE 0.9% FLUSH
10.0000 mL | Freq: Once | INTRAVENOUS | Status: AC
  Administered 2023-09-25: 10 mL

## 2023-09-25 MED ORDER — SODIUM CHLORIDE 0.9 % IV SOLN: Freq: Once | INTRAVENOUS | Status: AC

## 2023-09-25 MED ORDER — HEPARIN SOD (PORK) LOCK FLUSH 100 UNIT/ML IV SOLN
500.0000 [IU] | Freq: Once | INTRAVENOUS | Status: AC
  Administered 2023-09-25: 500 [IU]

## 2023-09-25 NOTE — Progress Notes (Signed)
Per Dr. Arbutus Ped , it is okay to give 1 liter of  normal saline IV over 1 hour.

## 2023-09-25 NOTE — Progress Notes (Signed)
[facility] [phone]   [phone]  OFFICE PROGRESS NOTE  [name], MD [address] [place] [address]  DIAGNOSIS: Stage IV (T2 a, N2, M1b) non-small cell lung cancer, adenocarcinoma presented with left upper lobe lung mass in addition to left hilar and precarinal metastatic adenopathy and small left lower lobe pulmonary nodule in addition to solitary left frontal brain metastasis diagnosed in July 2022.  Molecular studies by [employer] showed  Positive KRAS G12C mutation PD-L1 expression was less than 1%   PRIOR THERAPY:  1) Status post SRS to the solitary brain metastasis. 2) Treatment for the locally advanced disease in the chest with carboplatin for AUC of 2 and paclitaxel 45 Mg/M2.  Status post 6 cycles.  Last dose was given 07/25/2021. 3) Systemic chemotherapy with carboplatin for AUC of 5, Alimta 500 Mg/M2 and Keytruda 200 Mg IV every 3 weeks.  First dose August 31, 2021.  Status post 33 cycles.  Starting from cycle #5 the patient will be on maintenance treatment with Alimta and Keytruda every 3 weeks.  Starting from cycle #30 she will be on single agent Keytruda 200 Mg IV every 3 weeks.  Alimta was discontinued secondary to intolerance.  CURRENT THERAPY: Observation.  INTERVAL HISTORY: Jill [name] 72 y.o. female returns to the clinic today for follow-up visit accompanied by her daughter.Discussed the use of AI scribe software for clinical note transcription with the patient, who gave verbal consent to proceed.  History of Present Illness   The patient, a 72 year old with a history of stage IV green adenocarcinoma, has been undergoing treatment with carboplatin, Alimta, and Keytruda for the past two years. Recently, she experienced a six-week period of illness characterized by vomiting and diarrhea, which was also experienced by other members of her household. This illness was concurrent with a diagnosis of  thyroid dysfunction, which was identified two months prior to the current consultation.  The patient reports persistent fatigue and a sensation of 'dying,' along with constant pain in the right hip area. She denies current nausea, vomiting, and diarrhea, and reports no issues with breathing. However, she does experience frequent pain under the left breast. The patient has also noticed significant weight loss, with a decrease from 133 to 124 pounds according to her home scale.  The patient's treatment regimen has been challenging, with the patient expressing a desire to stop treatment due to the severity of her symptoms. She is scheduled for a brain MRI in the near future. Despite these challenges, the patient maintains a positive outlook and continues to participate in family activities and celebrations.       MEDICAL HISTORY: Past Medical History:  Diagnosis Date   Anxiety    ON PAXIL, XANAX   AVM (arteriovenous malformation) brain    s/p stent/coil   Blood transfusion    x 2   Cancer (HCC)    Left lung   COPD (chronic obstructive pulmonary disease) (HCC)    no inhaler, no oxygen   Coronary artery disease    Prior inferior MI with stent to RCA, s/p CABG in 2008   Depression    Dizziness    Dyspnea    with exertion, no oxygen   Fatigue    Foot injury 06/01/2017   right - RESOLVED, no longer an issue per patient 05/18/21   GERD (gastroesophageal reflux disease)    Headache(784.0)    UNRUPTURED CEREBRAL ANEURYSM   Hyperlipidemia  Hypertension    Hypothyroidism    Infected cyst of skin 09/18/2013   Left knee pain 06/05/2012   Lung mass    Left lung   Myocardial infarction [facility])    Normal nuclear stress test Ju;y 2012   No ischemia. EF 70%; fixed defect involving septum, inferoseptal and inferior wall   Pain, dental 02/06/2017   Poor dental hygiene    Retroperitoneal bleeding    Following cardiac cath   Tobacco abuse    Urine discoloration 09/18/2013   Urine incontinence  07/06/2020   UTI (lower urinary tract infection) 10/16/2013    ALLERGIES:  is allergic to varenicline, ace inhibitors, prednisone, betalin 12 [vitamin b12], penicillins, and sulfa drugs cross reactors.  MEDICATIONS:  Current Outpatient Medications  Medication Sig Dispense Refill   acetaminophen (TYLENOL) 500 MG tablet Take 500 mg by mouth every 6 (six) hours as needed.     albuterol (PROVENTIL) (2.5 MG/3ML) 0.083% nebulizer solution Take 3 mLs (2.5 mg total) by nebulization every 6 (six) hours as needed for wheezing or shortness of breath. (Patient not taking: Reported on 07/31/2023) 75 mL 12   albuterol (VENTOLIN HFA) 108 (90 Base) MCG/ACT inhaler Inhale 2 puffs into the lungs every 6 (six) hours as needed for wheezing or shortness of breath. (Patient not taking: Reported on 07/31/2023) 8 g 2   amLODipine (NORVASC) 10 MG tablet TAKE 1 TABLET BY MOUTH ONCE DAILY . APPOINTMENT REQUIRED FOR FUTURE REFILLS 90 tablet 1   aspirin EC 81 MG tablet Take 1 tablet (81 mg total) by mouth daily. 90 tablet 2   atorvastatin (LIPITOR) 40 MG tablet TAKE 1 TABLET BY MOUTH EVERY DAY 90 tablet 1   azithromycin (ZITHROMAX Z-PAK) 250 MG tablet Take two tablets on day one and then one tablet daily x 4 day. 6 each 0   Cholecalciferol (VITAMIN D3) 250 MCG (10000 UT) capsule Take 10,000 mcg by mouth daily.     clonazePAM (KLONOPIN) 0.5 MG tablet Take 1 tablet (0.5 mg total) by mouth 2 (two) times daily as needed for anxiety. 50 tablet 1   diphenhydrAMINE HCl (BENADRYL ALLERGY PO) Take 25 mg by mouth at bedtime as needed (for sleep). (Patient not taking: Reported on 07/31/2023)     escitalopram (LEXAPRO) 10 MG tablet TAKE 1 TABLET BY MOUTH EVERY DAY 90 tablet 1   folic acid (FOLVITE) 1 MG tablet TAKE 1 TABLET BY MOUTH EVERY DAY 90 tablet 0   hydrocortisone cream 0.5 % Apply 1 Application topically 2 (two) times daily. As needed for itching. (Patient not taking: Reported on 07/31/2023) 30 g 0   levothyroxine (SYNTHROID) 175  MCG tablet TAKE 1 TABLET BY MOUTH DAILY BEFORE BREAKFAST. 30 tablet 1   lidocaine-prilocaine (EMLA) cream Apply 1 Application topically as needed. (Patient not taking: Reported on 07/31/2023) 30 g 2   losartan (COZAAR) 100 MG tablet TAKE 1 TABLET BY MOUTH EVERY DAY 90 tablet 1   metoprolol succinate (TOPROL-XL) 50 MG 24 hr tablet TAKE 1 TABLET BY MOUTH ONCE DAILY WITH MEALS OR IMMEDIATLEY AFTER A MEAL 90 tablet 1   Multiple Vitamins-Minerals (MULTIVITAMIN WITH MINERALS) tablet Take 1 tablet by mouth daily.     nitroGLYCERIN (NITROSTAT) 0.4 MG SL tablet Place 1 tablet (0.4 mg total) under the tongue every 5 (five) minutes as needed. For chest pain (Patient not taking: Reported on 07/31/2023) 25 tablet 6   prochlorperazine (COMPAZINE) 10 MG tablet TAKE 1 TABLET BY MOUTH EVERY 6 HOURS AS NEEDED NAUSEA AND VOMITING 30  tablet 0   senna (SENOKOT) 8.6 MG TABS tablet Take 1 tablet (8.6 mg total) by mouth 2 (two) times daily as needed for mild constipation. 60 tablet 0   Vitamin A 2400 MCG (8000 UT) TABS Take 2,400 mg by mouth daily.     No current facility-administered medications for this visit.    SURGICAL HISTORY:  Past Surgical History:  Procedure Laterality Date   CARDIAC CATHETERIZATION  01/02/2007   IT REVEALS MILD INFERIOR WALL HYPOKINESIS. THE EJECTION FRACTION IS AROUND 50%   COLONOSCOPY     CORONARY ARTERY BYPASS GRAFT  11/06/2006   LIMA to LAD, SVG to DX, SVG to LCX & SVG to OM 1 & 2, and SVG to PD   CORONARY STENT PLACEMENT     Remote past stent to RCA   EYE SURGERY Right    cataracts removed   HEMORRHOID SURGERY  11/07/1987   IR IMAGING GUIDED PORT INSERTION  10/04/2022   UPPER GI ENDOSCOPY     VENTRICULOSTOMY  10/06/2011   Procedure: VENTRICULOSTOMY;  Surgeon: Carmela Hurt;  Location: [facility];  Service: Neurosurgery;  Laterality: Right;  Insertion of Ventriculostomy Catheter   VIDEO BRONCHOSCOPY WITH ENDOBRONCHIAL NAVIGATION Left 05/20/2021   Procedure: VIDEO BRONCHOSCOPY  WITH ENDOBRONCHIAL NAVIGATION;  Surgeon: Josephine Igo, DO;  Location: [facility] OR;  Service: Pulmonary;  Laterality: Left;   VIDEO BRONCHOSCOPY WITH ENDOBRONCHIAL ULTRASOUND Bilateral 05/20/2021   Procedure: VIDEO BRONCHOSCOPY WITH ENDOBRONCHIAL ULTRASOUND;  Surgeon: Josephine Igo, DO;  Location: [facility] OR;  Service: Pulmonary;  Laterality: Bilateral;   WISDOM TOOTH EXTRACTION      REVIEW OF SYSTEMS:  Constitutional: positive for anorexia, fatigue, and weight loss Eyes: negative Ears, nose, mouth, throat, and face: negative Respiratory: positive for pleurisy/chest pain Cardiovascular: negative Gastrointestinal: negative Genitourinary:negative Integument/breast: negative Hematologic/lymphatic: negative Musculoskeletal:positive for arthralgias and back pain Neurological: negative Behavioral/Psych: negative Endocrine: negative Allergic/Immunologic: negative   PHYSICAL EXAMINATION: General appearance: alert, cooperative, fatigued, and no distress Head: Normocephalic, without obvious abnormality, atraumatic Neck: no adenopathy, no JVD, supple, symmetrical, trachea midline, and thyroid not enlarged, symmetric, no tenderness/mass/nodules Lymph nodes: Cervical, supraclavicular, and axillary nodes normal. Resp: clear to auscultation bilaterally Back: symmetric, no curvature. ROM normal. No CVA tenderness. Cardio: regular rate and rhythm, S1, S2 normal, no murmur, click, rub or gallop GI: soft, non-tender; bowel sounds normal; no masses,  no organomegaly Extremities: extremities normal, atraumatic, no cyanosis or edema Neurologic: Alert and oriented X 3, normal strength and tone. Normal symmetric reflexes. Normal coordination and gait  ECOG PERFORMANCE STATUS: 1 - Symptomatic but completely ambulatory  Blood pressure 121/71, pulse 95, temperature 98 F (36.7 C), temperature source Temporal, resp. rate 15, SpO2 97%.  LABORATORY DATA: Lab Results  Component Value Date   WBC 8.7 09/25/2023    HGB 10.3 (L) 09/25/2023   HCT 33.3 (L) 09/25/2023   MCV 94.3 09/25/2023   PLT 450 (H) 09/25/2023      Chemistry      Component Value Date/Time   NA 137 08/07/2023 1323   NA 141 04/12/2021 1356   K 3.5 08/07/2023 1323   CL 100 08/07/2023 1323   CO2 30 08/07/2023 1323   BUN 10 08/07/2023 1323   BUN 13 04/12/2021 1356   CREATININE 0.90 08/07/2023 1323   CREATININE 0.99 08/29/2016 1130      Component Value Date/Time   CALCIUM 10.0 08/07/2023 1323   ALKPHOS 75 08/07/2023 1323   AST 15 08/07/2023 1323   ALT 9 08/07/2023 1323  BILITOT 0.3 08/07/2023 1323       RADIOGRAPHIC STUDIES: No results found.  ASSESSMENT AND PLAN: This is a very pleasant 72 years old white female diagnosed with a stage IV (T2 a, N2, M1 B) non-small cell lung cancer, adenocarcinoma presented with left upper lobe lung mass in addition to left hilar and precarinal metastatic adenopathy as well as small left lower lobe pulmonary nodule and solitary left frontal brain metastasis diagnosed in July 2022.  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 The patient is status post SRS to the solitary brain metastasis. She underwent a course of concurrent chemoradiation to the locally advanced disease in the chest with carboplatin for AUC of 2 and paclitaxel 45 Mg/M2 status post 7 cycles.  The patient has been tolerating her treatment well with no concerning adverse effects. Her scan showed mild improvement of her disease with no concerning findings for progression. Technically the patient has a stage IV lung cancer with brain metastasis  The patient is currently undergoing systemic chemotherapy with carboplatin for AUC of 5, Alimta 500 Mg/M2 and Keytruda 200 Mg IV every 3 weeks status post 33 cycles.  Starting from cycle #5 the patient will be on maintenance treatment with Alimta and Keytruda every 3 weeks.  Starting from cycle #30 she will be on treatment with single agent  Keytruda every 3 weeks.  Alimta was discontinued secondary to increasing fatigue and weakness and intolerance.  She has been tolerating her treatment with single agent Keytruda fairly well.  Her treatment was discontinued today secondary to persistent fatigue and weakness.    Metastatic Adenocarcinoma of the Lung Currently undergoing Keytruda treatment for almost two years with significant fatigue, weight loss, and back pain. Completed four years of carboplatin and pemetrexed. Considering stopping Keytruda due to side effects. Will undergo a scan to assess current cancer status before making a final decision. - Administer IV fluids today - Order scan to assess cancer status - Schedule follow-up in one month - Perform brain MRI on October 03, 2023  Thyroid Dysfunction Diagnosed two months ago with symptoms of fatigue and weight loss. Thyroid function tests pending. Symptoms may be exacerbated by thyroid dysfunction. - Review thyroid function test results when available - Follow-up with primary care physician on Tuesday  Hypokalemia Potassium level is 3.2 mmol/L. Advised dietary modifications to increase potassium intake. - Increase dietary intake of potassium-rich foods such as bananas and orange juice  General Health Maintenance Hemoglobin is 10.3 g/dL, other lab results within reasonable limits. No acute issues noted. - Monitor hemoglobin levels - Encourage hydration and balanced diet  Viral Gastroenteritis Experienced nausea, vomiting, and diarrhea two months ago, likely due to a viral infection affecting the entire household. Symptoms have resolved. - No specific plan required as symptoms have resolved  Follow-up - Schedule follow-up appointment in one month - Perform scan one week before follow-up - Perform brain MRI on October 03, 2023.   Her last MRI of the brain that showed solitary residual left superior frontal gyrus metastatic site that stable around 5 mm.  She underwent  SRS under the care of Dr. Kathrynn Running on 05/23/2023.  She is supposed to have repeat MRI of the brain on October 03, 2023. For the anemia, I advised her to take oral iron tablet every other day with vitamin C. The patient was advised to call immediately if she has any other concerning symptoms in the interval. The patient voices understanding of current disease status  and treatment options and is in agreement with the current care plan. The total time spent in the appointment was 30 minutes.  All questions were answered. The patient knows to call the clinic with any problems, questions or concerns. We can certainly see the patient much sooner if necessary.  Disclaimer: This note was dictated with voice recognition software. Similar sounding words can inadvertently be transcribed and may not be corrected upon review.

## 2023-09-25 NOTE — Patient Instructions (Signed)

## 2023-09-27 ENCOUNTER — Telehealth: Payer: Self-pay | Admitting: Internal Medicine

## 2023-09-27 NOTE — Telephone Encounter (Signed)
Left patient a message in regards to scheduled appointment times/dates for follow up appointment

## 2023-10-02 ENCOUNTER — Encounter: Payer: Self-pay | Admitting: Family Medicine

## 2023-10-02 ENCOUNTER — Encounter (HOSPITAL_COMMUNITY): Payer: Self-pay | Admitting: Family Medicine

## 2023-10-02 ENCOUNTER — Observation Stay (HOSPITAL_COMMUNITY): Payer: Medicare HMO

## 2023-10-02 ENCOUNTER — Encounter (HOSPITAL_COMMUNITY): Payer: Self-pay

## 2023-10-02 ENCOUNTER — Other Ambulatory Visit: Payer: Self-pay

## 2023-10-02 ENCOUNTER — Observation Stay (HOSPITAL_COMMUNITY)
Admission: EM | Admit: 2023-10-02 | Discharge: 2023-10-04 | Disposition: A | Discharge status: Home / Self Care | Payer: Medicare HMO | Source: Ambulatory Visit | Attending: Family Medicine | Admitting: Family Medicine

## 2023-10-02 ENCOUNTER — Ambulatory Visit: Payer: Medicare HMO | Admitting: Family Medicine

## 2023-10-02 VITALS — BP 126/64 | HR 114 | Ht 66.0 in | Wt 126.0 lb

## 2023-10-02 DIAGNOSIS — C3490 Malignant neoplasm of unspecified part of unspecified bronchus or lung: Secondary | ICD-10-CM | POA: Diagnosis not present

## 2023-10-02 DIAGNOSIS — Z7982 Long term (current) use of aspirin: Secondary | ICD-10-CM | POA: Diagnosis not present

## 2023-10-02 DIAGNOSIS — R053 Chronic cough: Secondary | ICD-10-CM | POA: Diagnosis not present

## 2023-10-02 DIAGNOSIS — R2689 Other abnormalities of gait and mobility: Secondary | ICD-10-CM | POA: Diagnosis not present

## 2023-10-02 DIAGNOSIS — Z85118 Personal history of other malignant neoplasm of bronchus and lung: Secondary | ICD-10-CM | POA: Diagnosis not present

## 2023-10-02 DIAGNOSIS — E43 Unspecified severe protein-calorie malnutrition: Secondary | ICD-10-CM | POA: Insufficient documentation

## 2023-10-02 DIAGNOSIS — I10 Essential (primary) hypertension: Secondary | ICD-10-CM | POA: Insufficient documentation

## 2023-10-02 DIAGNOSIS — R0609 Other forms of dyspnea: Secondary | ICD-10-CM | POA: Diagnosis not present

## 2023-10-02 DIAGNOSIS — M6281 Muscle weakness (generalized): Secondary | ICD-10-CM | POA: Insufficient documentation

## 2023-10-02 DIAGNOSIS — R042 Hemoptysis: Secondary | ICD-10-CM | POA: Diagnosis not present

## 2023-10-02 DIAGNOSIS — Z79899 Other long term (current) drug therapy: Secondary | ICD-10-CM | POA: Diagnosis not present

## 2023-10-02 DIAGNOSIS — F17210 Nicotine dependence, cigarettes, uncomplicated: Secondary | ICD-10-CM | POA: Insufficient documentation

## 2023-10-02 DIAGNOSIS — I2510 Atherosclerotic heart disease of native coronary artery without angina pectoris: Secondary | ICD-10-CM | POA: Insufficient documentation

## 2023-10-02 DIAGNOSIS — C3412 Malignant neoplasm of upper lobe, left bronchus or lung: Secondary | ICD-10-CM

## 2023-10-02 DIAGNOSIS — E785 Hyperlipidemia, unspecified: Secondary | ICD-10-CM | POA: Diagnosis not present

## 2023-10-02 DIAGNOSIS — J449 Chronic obstructive pulmonary disease, unspecified: Secondary | ICD-10-CM

## 2023-10-02 DIAGNOSIS — E039 Hypothyroidism, unspecified: Secondary | ICD-10-CM | POA: Diagnosis not present

## 2023-10-02 DIAGNOSIS — E440 Moderate protein-calorie malnutrition: Secondary | ICD-10-CM

## 2023-10-02 DIAGNOSIS — R269 Unspecified abnormalities of gait and mobility: Secondary | ICD-10-CM

## 2023-10-02 DIAGNOSIS — R0789 Other chest pain: Secondary | ICD-10-CM | POA: Diagnosis not present

## 2023-10-02 DIAGNOSIS — J439 Emphysema, unspecified: Secondary | ICD-10-CM | POA: Diagnosis not present

## 2023-10-02 DIAGNOSIS — Z951 Presence of aortocoronary bypass graft: Secondary | ICD-10-CM | POA: Insufficient documentation

## 2023-10-02 DIAGNOSIS — J90 Pleural effusion, not elsewhere classified: Secondary | ICD-10-CM

## 2023-10-02 DIAGNOSIS — S2242XA Multiple fractures of ribs, left side, initial encounter for closed fracture: Secondary | ICD-10-CM | POA: Diagnosis not present

## 2023-10-02 HISTORY — DX: Other forms of dyspnea: R06.09

## 2023-10-02 LAB — CBC
HCT: 28.7 % — ABNORMAL LOW (ref 36.0–46.0)
Hemoglobin: 9 g/dL — ABNORMAL LOW (ref 12.0–15.0)
MCH: 29.3 pg (ref 26.0–34.0)
MCHC: 31.4 g/dL (ref 30.0–36.0)
MCV: 93.5 fL (ref 80.0–100.0)
Platelets: 316 10*3/uL (ref 150–400)
RBC: 3.07 MIL/uL — ABNORMAL LOW (ref 3.87–5.11)
RDW: 15.7 % — ABNORMAL HIGH (ref 11.5–15.5)
WBC: 7.7 10*3/uL (ref 4.0–10.5)
nRBC: 0 % (ref 0.0–0.2)

## 2023-10-02 LAB — BASIC METABOLIC PANEL
Anion gap: 8 (ref 5–15)
BUN: 9 mg/dL (ref 8–23)
CO2: 30 mmol/L (ref 22–32)
Calcium: 9 mg/dL (ref 8.9–10.3)
Chloride: 100 mmol/L (ref 98–111)
Creatinine, Ser: 0.74 mg/dL (ref 0.44–1.00)
GFR, Estimated: >60 mL/min (ref >60)
Glucose, Bld: 99 mg/dL (ref 70–99)
Potassium: 4.1 mmol/L (ref 3.5–5.1)
Sodium: 138 mmol/L (ref 135–145)

## 2023-10-02 LAB — BRAIN NATRIURETIC PEPTIDE: B Natriuretic Peptide: 110.3 pg/mL — ABNORMAL HIGH (ref 0.0–100.0)

## 2023-10-02 LAB — TSH: TSH: 0.076 u[IU]/mL — ABNORMAL LOW (ref 0.350–4.500)

## 2023-10-02 LAB — POC SOFIA 2 FLU + SARS ANTIGEN FIA
Influenza A, POC: NEGATIVE
Influenza B, POC: NEGATIVE
SARS Coronavirus 2 Ag: NEGATIVE

## 2023-10-02 MED ORDER — METOPROLOL SUCCINATE ER 50 MG PO TB24
50.0000 mg | ORAL_TABLET | Freq: Every day | ORAL | Status: DC
  Administered 2023-10-02 – 2023-10-04 (×3): 50 mg via ORAL
  Filled 2023-10-02 (×3): qty 1

## 2023-10-02 MED ORDER — ESCITALOPRAM OXALATE 10 MG PO TABS
10.0000 mg | ORAL_TABLET | Freq: Every day | ORAL | Status: DC
  Administered 2023-10-03 – 2023-10-04 (×2): 10 mg via ORAL
  Filled 2023-10-02 (×2): qty 1

## 2023-10-02 MED ORDER — AMLODIPINE BESYLATE 10 MG PO TABS
10.0000 mg | ORAL_TABLET | Freq: Every day | ORAL | Status: DC
  Administered 2023-10-02 – 2023-10-04 (×3): 10 mg via ORAL
  Filled 2023-10-02 (×3): qty 1

## 2023-10-02 MED ORDER — UMECLIDINIUM-VILANTEROL 62.5-25 MCG/ACT IN AEPB
1.0000 | INHALATION_SPRAY | Freq: Every day | RESPIRATORY_TRACT | Status: DC
Start: 2023-10-02 — End: 2023-10-04
  Administered 2023-10-03 – 2023-10-04 (×2): 1 via RESPIRATORY_TRACT
  Filled 2023-10-02: qty 14

## 2023-10-02 MED ORDER — ASPIRIN 81 MG PO TBEC
81.0000 mg | DELAYED_RELEASE_TABLET | Freq: Every day | ORAL | Status: DC
  Administered 2023-10-02 – 2023-10-04 (×3): 81 mg via ORAL
  Filled 2023-10-02 (×3): qty 1

## 2023-10-02 MED ORDER — LEVOTHYROXINE SODIUM 100 MCG PO TABS
175.0000 ug | ORAL_TABLET | Freq: Every day | ORAL | Status: DC
  Administered 2023-10-02 – 2023-10-04 (×3): 175 ug via ORAL
  Filled 2023-10-02 (×3): qty 1

## 2023-10-02 MED ORDER — LOSARTAN POTASSIUM 50 MG PO TABS
100.0000 mg | ORAL_TABLET | Freq: Every day | ORAL | Status: DC
  Administered 2023-10-02 – 2023-10-04 (×3): 100 mg via ORAL
  Filled 2023-10-02 (×3): qty 2

## 2023-10-02 MED ORDER — ATORVASTATIN CALCIUM 40 MG PO TABS
40.0000 mg | ORAL_TABLET | Freq: Every day | ORAL | Status: DC
  Administered 2023-10-02 – 2023-10-04 (×3): 40 mg via ORAL
  Filled 2023-10-02 (×3): qty 1

## 2023-10-02 MED ORDER — FOLIC ACID 1 MG PO TABS
1.0000 mg | ORAL_TABLET | Freq: Every day | ORAL | Status: DC
  Administered 2023-10-03 – 2023-10-04 (×2): 1 mg via ORAL
  Filled 2023-10-02 (×2): qty 1

## 2023-10-02 MED ORDER — CLONAZEPAM 0.5 MG PO TABS: 0.5000 mg | ORAL_TABLET | Freq: Two times a day (BID) | ORAL | Status: DC | PRN

## 2023-10-02 MED ORDER — ENOXAPARIN SODIUM 40 MG/0.4ML IJ SOSY
40.0000 mg | PREFILLED_SYRINGE | INTRAMUSCULAR | Status: DC
  Administered 2023-10-02 – 2023-10-03 (×2): 40 mg via SUBCUTANEOUS
  Filled 2023-10-02 (×2): qty 0.4

## 2023-10-02 MED ORDER — CHLORHEXIDINE GLUCONATE CLOTH 2 % EX PADS
6.0000 | MEDICATED_PAD | Freq: Every day | CUTANEOUS | Status: DC
  Administered 2023-10-02 – 2023-10-04 (×3): 6 via TOPICAL

## 2023-10-02 MED ORDER — ALBUTEROL SULFATE (2.5 MG/3ML) 0.083% IN NEBU: 2.5000 mg | INHALATION_SOLUTION | Freq: Four times a day (QID) | RESPIRATORY_TRACT | Status: DC | PRN

## 2023-10-02 MED ORDER — IOHEXOL 350 MG/ML SOLN
75.0000 mL | Freq: Once | INTRAVENOUS | Status: AC | PRN
  Administered 2023-10-02: 75 mL via INTRAVENOUS

## 2023-10-02 MED ORDER — POLYETHYLENE GLYCOL 3350 17 G PO PACK: 17.0000 g | PACK | Freq: Every day | ORAL | Status: DC | PRN

## 2023-10-02 MED ORDER — SODIUM CHLORIDE 0.9% FLUSH
10.0000 mL | Freq: Two times a day (BID) | INTRAVENOUS | Status: DC
Start: 2023-10-02 — End: 2023-10-04
  Administered 2023-10-02 – 2023-10-04 (×4): 10 mL

## 2023-10-02 MED ORDER — ALBUTEROL SULFATE HFA 108 (90 BASE) MCG/ACT IN AERS: 2.0000 | INHALATION_SPRAY | Freq: Four times a day (QID) | RESPIRATORY_TRACT | Status: DC | PRN

## 2023-10-02 MED ORDER — SODIUM CHLORIDE 0.9% FLUSH: 10.0000 mL | INTRAVENOUS | Status: DC | PRN

## 2023-10-02 MED ORDER — SENNA 8.6 MG PO TABS: 1.0000 | ORAL_TABLET | Freq: Two times a day (BID) | ORAL | Status: DC | PRN

## 2023-10-02 NOTE — Progress Notes (Addendum)
SUBJECTIVE:   CHIEF COMPLAINT / HPI:   Cough/COPD: The patient is here for worsening of her cough, which has been ongoing for more than > 3 months. She was treated with Zithromax in Oct with some improvement and her cough came right back. She self discontinued her Trelegy due to cost and preferred to stay only on albuterol prn. She endorsed worsening of her cough, with SOB, chest tightness, and pain caused by excessive cough. She also has pain in her stomach from coughing and has thrown up quite a few times with her cough. Her cough is productive of blood tinged sputum. Her SOB is worsened on mild exertion. She worried she might have fluid in her lungs ago, for which she had thoracentesis in the past.  Hypothyroidism: Here for repeat TSH. She is compliant with her Synthroid 175 mcg QD.  Gait instability: She has difficulty ambulating and is very fatigued. No recent fall at home.  Health maintenance: She is due for her Shingles shot and colon cancer screening.  PERTINENT  PMH / PSH: PMHx reviewed  OBJECTIVE:   BP 126/64   Pulse (!) 114 Comment: with ambulation  Ht 5\' 6"  (1.676 m)   Wt 126 lb (57.2 kg)   SpO2 90% Comment: With ambulation  BMI 20.34 kg/m   Physical Exam Vitals reviewed.  Constitutional:      Appearance: She is ill-appearing. She is not diaphoretic.     Comments: Cachetic  Cardiovascular:     Rate and Rhythm: Tachycardia present.     Heart sounds: Normal heart sounds. No murmur heard. Pulmonary:     Effort: Respiratory distress present.     Breath sounds: No wheezing.     Comments: Mild respiratory distress at rest with moderate exacerbation with ambulation Reduced air entry B/L Abdominal:     General: Abdomen is flat. Bowel sounds are normal. There is no distension.     Palpations: Abdomen is soft. There is no mass.     Tenderness: There is no abdominal tenderness.  Musculoskeletal:     Right lower leg: No edema.     Left lower leg: No edema.   Neurological:     Mental Status: She is oriented to person, place, and time.  Psychiatric:        Mood and Affect: Mood is depressed.        Behavior: Behavior normal.        Judgment: Judgment normal.     Comments: Flowsheet Row Office Visit from 10/02/2023 in [facility] Dept Of [facility]  PHQ-9 Total Score 18          ASSESSMENT/PLAN:   COPD (chronic obstructive pulmonary disease) (HCC) Likely having COPD exacerbation +/- Pneumonia vs worsening of her Lung cancer, given her hemoptysis. Influenza and COVID test done in the clinic were negative The plan was to treat with oral steroids and A/B and to obtain an x-ray However, she desaturated to the 90s during evaluation but improved back to the 92% on RA I discussed the use of Triple therapy for her COPD treatment, and is interested in getting back on this Unable to afford Trelegy, and as discussed with her, we can split this medication with the hope that cost coverage will improve I discussed inpatient observation and treatment with her, as the clinic would be closed after tomorrow  She agreed with hospitalization I have reached out to the inpatient team regarding her Plan to  obtain CT chest  Primary adenocarcinoma of left upper lobe of lung [facility]) May be contributory to her symptoms Scheduled MRI brain to assess her brain met May do this while in the hospital F/u with Dr. Shirline Frees as planned   Hypothyroidism She was on Keytruda which could impair her thyroid function Need TSH rechecked today Will recheck with hospital lab  Gait difficulty Likely due to deconditioning from her chronic illness Home PT suggested but she declined Plan to consult PT/OT while admitted to the hospital   Malnutrition of moderate degree (HCC) Loss of muscle mass and weight In the settings of chronic disease On home ensure with poor weight gain Consult dietitian while in the hospital  Disposition:  Admitted directly to the hospital   Janit Pagan, MD [facility]

## 2023-10-02 NOTE — Assessment & Plan Note (Addendum)
Hemodynamically stable. Presenting with 4 days of bloody sputum.  No change in cough or amount of sputum.  Chronic chest pain.  Subjective fevers at home.  Patient at increased risk of PE given malignancy.   EKG without ischemic findings. Differential per above.  -Admit to [facility]MT[facility], med/tele, attending Dr. Pollie Meyer -CTA chest to rule out PE -CBC, BMP ordered -Continuous cardiac monitoring -Consider ambulatory walk test -Vital signs per floor protocol -O2 as needed for goal 88 to 92% -AM CBC, BMP, TSH

## 2023-10-02 NOTE — Assessment & Plan Note (Signed)
Loss of muscle mass and weight In the settings of chronic disease On home ensure with poor weight gain Consult dietitian while in the hospital

## 2023-10-02 NOTE — Assessment & Plan Note (Signed)
She was on Keytruda which could impair her thyroid function Need TSH rechecked today Will recheck with hospital lab

## 2023-10-02 NOTE — Assessment & Plan Note (Addendum)
May be contributory to her symptoms Scheduled MRI brain to assess her brain met May do this while in the hospital F/u with Dr. Shirline Frees as planned

## 2023-10-02 NOTE — Assessment & Plan Note (Signed)
Likely due to deconditioning from her chronic illness Home PT suggested but she declined Plan to consult PT/OT while admitted to the hospital

## 2023-10-02 NOTE — Assessment & Plan Note (Signed)
Stage IV non-small cell lung cancer with solitary brain metastasis diagnosed in 2022.  Follows with Dr. Arlis Porta.  Has undergone treatment with carboplatin, Alimta and Keytruda over the past 2 years.  Patient has expressed desire to stop treatment due to severity of her symptoms per previous onc note.   -Patient's primary oncologist is out of office.  Spoke with on-call oncologist about need for obtaining brain MRI while admitted if she is unable to get to her scheduled appointment tomorrow morning.  On-call oncologist advised this may have challenges from a billing perspective.  Patient's primary oncologist to follow with the patient upon discharge and return to the office. -If patient is not able to discharge by her appointment time tomorrow morning, obtain brain MRI as feasible. -Registered dietitian consulted for malnutrition

## 2023-10-02 NOTE — Addendum Note (Signed)
Addended by: Janit Pagan T on: 10/02/2023 03:32 PM   Modules accepted: Orders

## 2023-10-02 NOTE — Plan of Care (Signed)
[facility]MTS Brief Progress Note  S: Seen at bedside on evening rounds.  Patient sitting up in bed talking on the phone.  Tells me that she feels generally well.  Has had ongoing scant hemoptysis.  Wants a cup of coffee. Came to the hospital as a direct admission from clinic due to this hemoptysis and concern for possible PE versus pneumonia versus progression of her lung cancer.  Thankfully workup so far has been benign.  CTA without evidence of pulmonary embolus.  Redemonstration of her known lung cancer.  Also redemonstration  no evidence of mediastinal metastases.  Hemoglobin was 9.0, from her baseline of 10-11. BNP minimally elevated at 110.    O: BP 133/64 (BP Location: Right Arm)   Pulse 93   Temp 98.2 F (36.8 C) (Oral)   Resp 16   Ht 5\' 6"  (1.676 m)   Wt 57.4 kg   SpO2 92%   BMI 20.42 kg/m   General: Chronically ill-appearing, sitting up in bed in no apparent distress Cardio: Regular rate, regular rhythm, without murmur Pulm: Normal work of breathing on room air, frequent cough, poor air movement throughout but no appreciable wheeze, rales, or rhonchi  A/P: Hemoptysis  Stage IV Adenocarcinoma of the Lung  Reassuring workup thus far.  I have fairly high suspicion that this may represent progression of her lung cancer.  Will need to keep an eye on her hemoglobin.  No increase in sputum production or change in sputum character to suggest a COPD exacerbation.  She is stable on room air breathing comfortably at the time of my exam.  Of note, she was scheduled for an MRI of the brain tomorrow to assess treatment response of her brain mets.  Unfortunately, she will not be making this appointment. -At this point, I am hopeful for this to be a relatively short hospitalization. She is stable on room air and feels generally well -Will need to reschedule her MRI Brain vs trying to get this done inpatient, but per Onc MD on call, this may psoe challenges from a billing perspective  -RD to see given  malnutrition in this patient with known cancer   - Orders reviewed. Labs for AM ordered. Repeat CBC to monitor Hgb.    Alicia Amel, MD 10/02/2023, 8:02 PM PGY-3, [facility] Night Resident  Please page [phone] with questions.

## 2023-10-02 NOTE — Assessment & Plan Note (Addendum)
Respiratory status stable, on RA. Chronically dyspnea on exertion.  Breathing comfortably at rest.  Lower concern for acute respiratory decompensation. EKG without ischemic findings. -CTA chest per above -Consider ambulatory walk test -Continuous cardiac monitoring

## 2023-10-02 NOTE — Assessment & Plan Note (Addendum)
Likely having COPD exacerbation +/- Pneumonia vs worsening of her Lung cancer, given her hemoptysis. Influenza and COVID test done in the clinic were negative The plan was to treat with oral steroids and A/B and to obtain an x-ray However, she desaturated to the 90s during evaluation but improved back to the 92% on RA I discussed the use of Triple therapy for her COPD treatment, and is interested in getting back on this Unable to afford Trelegy, and as discussed with her, we can split this medication with the hope that cost coverage will improve I discussed inpatient observation and treatment with her, as the clinic would be closed after tomorrow  She agreed with hospitalization I have reached out to the inpatient team regarding her Plan to obtain CT chest

## 2023-10-02 NOTE — Hospital Course (Signed)
Jill [name] is a 72 y.o.female with a history of lung cancer and COPD who was admitted to the family medicine teaching Service at [facility] for hemoptysis. Her hospital course is detailed below:  Hemoptysis Hemodynamically stable. presenting with 4 days of bloody sputum, no change in cough or amount of sputum, chronic chest pain, associate fevers.  Increased risk of PE given malignancy.  EKG without ischemic findings.  CTPE negative for PE but showed continued known chronic loculated pleural effusion.   Dyspnea on exertion Respiratory status stable on room air, chronic dyspnea on exertion, fluid status euvolemic.  EKG without ischemic changes.  CTPE consistent with loculated left pleural effusion which is chronic.   Adenocarcinoma lung Stage IV non-small cell lung cancer with solitary brain metastasis diagnosed in 2022.  Follows with Dr. Arlis Porta.  Has undergone treatment with carboplatin, Alimta and Keytruda over the past 2 years.  Patient has expressed desire to stop treatment due to severity of her symptoms per previous onc note.  Patient had a scheduled MRI 11/27 morning that we are considering doing well in the hospital but the on-call oncologist reported that this is likely present a billing issue so plan to reschedule per primary oncologist.  Of note patient does have gait instability which is chronic.  Severe malnutrition Nutrition Status: Nutrition Problem: Severe Malnutrition Etiology: chronic illness Signs/Symptoms: moderate fat depletion, severe muscle depletion, energy intake < or equal to 50% for > or equal to 1 month Interventions: Boost Plus, MVI   Other chronic conditions were medically managed with home medications and formulary alternatives as necessary (HLD, COPD, CAD, hypothyroidism, HTN, constipation, GAD)  PCP Follow-up Recommendations: Reschedule MRI brain Follow-up with Dr. Shirline Frees

## 2023-10-02 NOTE — Patient Instructions (Signed)
It was nice seeing you today. As discussed, I want you to be observed in the hospital for your symptoms. You'll get treatment for COPD and also get an an xray or CT scan of your chest. I will see you back in 1 weeks after you are discharged from the hospital. Thanks.

## 2023-10-02 NOTE — H&P (Addendum)
Hospital Admission History and Physical Service Pager: [phone]  Patient name: Jill [name] Medical record number: [id number] Date of Birth: Jan 11, 1951 Age: 72 y.o. Gender: female  Primary Care Provider: [name], MD Consultants: None  Code Status: DNR/DNI Preferred Emergency Contact: Marita [place] (Daughter): [phone]  Chief Complaint: Coughing blood   Assessment and Plan: Jill [name] is a 72 y.o. female presenting from clinic for coughing up blood. Differential for presentation of this includes:  PE: Patient at increased risk of clotting due to malignancy. Malignancy: Hemoptysis may also represent progression of known lung carcinoma. PNA: Possible component given subjective fevers at home with cough, but likely related to cancer.  COPD exac: Less likely given no change in cough or amount of sputum. ACS: Not likely given chronic chest pain and EKG without ischemic findings.  Assessment & Plan Hemoptysis Hemodynamically stable. Presenting with 4 days of bloody sputum.  No change in cough or amount of sputum.  Chronic chest pain.  Subjective fevers at home.  Patient at increased risk of PE given malignancy.   EKG without ischemic findings. Differential per above.  -Admit to FMTS, med/tele, attending Dr. Pollie Meyer -CTA chest to rule out PE -CBC, BMP ordered -Continuous cardiac monitoring -Consider ambulatory walk test -Vital signs per floor protocol -O2 as needed for goal 88 to 92% -AM CBC, BMP, TSH Dyspnea on exertion Respiratory status stable, on RA. Chronically dyspnea on exertion.  Breathing comfortably at rest.  Lower concern for acute respiratory decompensation. EKG without ischemic findings. -CTA chest per above -Consider ambulatory walk test -Continuous cardiac monitoring  Adenocarcinoma of lung (HCC) Stage IV non-small cell lung cancer with solitary brain metastasis diagnosed in 2022.  Follows with Dr. Arlis Porta.  Has undergone treatment with  carboplatin, Alimta and Keytruda over the past 2 years.  Patient has expressed desire to stop treatment due to severity of her symptoms per previous onc note.   -Patient's primary oncologist is out of office.  Spoke with on-call oncologist about need for obtaining brain MRI while admitted if she is unable to get to her scheduled appointment tomorrow morning.  On-call oncologist advised this may have challenges from a billing perspective.  Patient's primary oncologist to follow with the patient upon discharge and return to the office. -If patient is not able to discharge by her appointment time tomorrow morning, obtain brain MRI as feasible. -Registered dietitian consulted for malnutrition   Chronic and Stable Problems:  HLD: Continue home atorvastatin 40 daily COPD: Continue home albuterol nebs every 6 as needed CAD: Continue home aspirin 81 daily Hypothyroidism: Continue home Synthroid 175 daily HTN: Continue home amlodipine 10 daily, losartan 100 daily, metoprolol 50 daily Constipation: Continue home senna twice daily as needed, added MiraLAX daily as needed Anxiety: Continue home Lexapro 10 daily, Klonopin 0.5 BID prn  FEN/GI: Regular diet VTE Prophylaxis: Lovenox   Disposition: Med telemetry  History of Present Illness:  Jill [name] is a 72 y.o. female with history of lung adenocarcinoma, CAD, COPD presenting with new hemoptysis.  Hemoptysis x4 days with associated posttusive emesis. Has bright red color in her phlegm. Has had subjective fevers and night sweats at home. Notes chronic dyspnea when walking. Has walked more today than in past two weeks. Has some central dull chest pain, which has been chronically on and off since having open heart surgery. No changes the past week other than it hurts when she coughs. Has had some new frontal headaches. No swelling in arms  or legs.  Had stomach bug two weeks ago. Some mild nausea. No diarrhea, does endorse constipation. Takes Senna regularly  which does not help. Last BM 3 days ago. No new skin bruising, blood in urine, or bloody stools.  Denies having increased sputum production or cough frequency the past couple days. Denies new dyspnea at rest, only with walking which she has not been doing as much until coming to clinic today. Only change is now has noticed some hemoptysis.  Sign out per Dr. Lum Babe. Direct admit to FMTS . 90-92% O2 saturation at rest in the clinic with a couple of desaturations. Has had new hemoptysis for 4 days.    Review Of Systems: Per HPI   Pertinent Past Medical History: Anxiety AVM brain Left lung cancer COPD CAD GERD Hyperlipidemia Hypertension Hypothyroidism MI Remainder reviewed in history tab.   Pertinent Past Surgical History: Cardiac cath 2008 CABG 2008 Remainder reviewed in history tab.   Pertinent Social History: Tobacco use: Yes, 1PPD Alcohol use: No Other Substance use: No Lives with whole family  Pertinent Family History: Mom-cancer, diabetes Father-alcohol abuse, asthma Remainder reviewed in history tab.   Important Outpatient Medications: Albuterol nebulizer Amlodipine 10 mg daily Aspirin 81 mg daily Lipitor 40 mg daily Clonazepam 0.5 mg twice daily as needed Lexapro 10 mg daily Levothyroxine 175 mcg daily Losartan 100 mg daily Metoprolol succinate 50 mg daily Nitroglycerin 0.4 mg as needed Vitamin A 2400 mcg daily  Remainder reviewed in medication history.   Objective: BP 133/64 (BP Location: Right Arm)   Pulse 93   Temp 98.2 F (36.8 C) (Oral)   Resp 16   Ht 5\' 6"  (1.676 m)   Wt 57.4 kg   SpO2 92%   BMI 20.42 kg/m  Exam: General: No acute distress. Eyes: Sclera without injection or icterus bilaterally. ENTM: MMM. Neck: Supple.  Normal range of motion. Cardiovascular: Possible systolic murmur.  No extra heart sounds.  Warm and well-perfused. Respiratory: Breathing comfortably on room air.  Diminished lung sounds throughout.  No crackles or  wheezes.  No increased WOB. Gastrointestinal: Bowel sounds present.  Soft.  Nontender.  Nondistended. MSK: No lower extremity edema noted.  Nontender to palpation. Derm: Warm, dry. Neuro: Alert and oriented. Psych: Appropriate.  Labs:  Negative COVID, flu  EKG 11/26: Sinus rhythm.  No ischemic findings.   Ivery Quale, MD 10/02/2023, 2:57 PM PGY-1, [facility] Family Medicine  FPTS Intern pager: [phone], text pages welcome Secure chat group [facility] Teaching Service   I have verified that the resident's  findings are accurately documented in the resident's note. I have made edits and changes where appropriate, and agree with plan.  Celine Mans, MD, PGY-2 [facility] Family Medicine 5:32 PM 10/02/2023

## 2023-10-03 ENCOUNTER — Inpatient Hospital Stay
Admission: RE | Admit: 2023-10-03 | Discharge: 2023-10-03 | Disposition: A | Discharge status: Home / Self Care | Payer: Medicare HMO | Source: Ambulatory Visit | Attending: Radiation Oncology | Admitting: Radiation Oncology

## 2023-10-03 DIAGNOSIS — E43 Unspecified severe protein-calorie malnutrition: Secondary | ICD-10-CM | POA: Insufficient documentation

## 2023-10-03 DIAGNOSIS — R042 Hemoptysis: Secondary | ICD-10-CM | POA: Diagnosis not present

## 2023-10-03 LAB — BASIC METABOLIC PANEL WITH GFR
Anion gap: 7 (ref 5–15)
BUN: 11 mg/dL (ref 8–23)
CO2: 29 mmol/L (ref 22–32)
Calcium: 9 mg/dL (ref 8.9–10.3)
Chloride: 102 mmol/L (ref 98–111)
Creatinine, Ser: 0.71 mg/dL (ref 0.44–1.00)
GFR, Estimated: >60 mL/min
Glucose, Bld: 108 mg/dL — ABNORMAL HIGH (ref 70–99)
Potassium: 4.4 mmol/L (ref 3.5–5.1)
Sodium: 138 mmol/L (ref 135–145)

## 2023-10-03 LAB — CBC
HCT: 28.4 % — ABNORMAL LOW (ref 36.0–46.0)
Hemoglobin: 8.7 g/dL — ABNORMAL LOW (ref 12.0–15.0)
MCH: 28.5 pg (ref 26.0–34.0)
MCHC: 30.6 g/dL (ref 30.0–36.0)
MCV: 93.1 fL (ref 80.0–100.0)
Platelets: 325 10*3/uL (ref 150–400)
RBC: 3.05 MIL/uL — ABNORMAL LOW (ref 3.87–5.11)
RDW: 15.7 % — ABNORMAL HIGH (ref 11.5–15.5)
WBC: 8.1 10*3/uL (ref 4.0–10.5)
nRBC: 0 % (ref 0.0–0.2)

## 2023-10-03 MED ORDER — ACETAMINOPHEN 325 MG PO TABS
650.0000 mg | ORAL_TABLET | Freq: Once | ORAL | Status: AC
  Administered 2023-10-03: 650 mg via ORAL
  Filled 2023-10-03: qty 2

## 2023-10-03 MED ORDER — BOOST PLUS PO LIQD
237.0000 mL | Freq: Three times a day (TID) | ORAL | Status: DC
Start: 2023-10-03 — End: 2023-10-04
  Administered 2023-10-03 – 2023-10-04 (×3): 237 mL via ORAL
  Filled 2023-10-03 (×5): qty 237

## 2023-10-03 NOTE — Discharge Instructions (Signed)
Dear [name],  Thank you for letting us participate in your care. You were hospitalized for cough with blood and diagnosed with Hemoptysis. You were treated with imaging to rule out pulmonary embolism and clinical observation.   POST-HOSPITAL & CARE INSTRUCTIONS Follow-up with Dr. Shirline Frees or Dr. Lum Babe for MRI rescheduling. Go to your follow up appointments (listed below)   DOCTOR'S APPOINTMENT   Future Appointments  Date Time Provider Department Center  10/03/2023 10:50 AM GI-315 MR 3 GI-315MRI GI-[facility]  10/08/2023  7:00 AM [facility]TUMOR BOARD CONFERENCE [facility] None  10/08/2023  9:30 AM Erven Colla, PA-C [facility] None  10/17/2023  9:00 AM [facility] LAB [facility] None  10/25/2023  8:30 AM Heilingoetter, Cassandra L, PA-C [facility] None     Take care and be well!  Family Medicine Teaching Service Inpatient Team [facility]  [address] [place], [place] [address] [phone]

## 2023-10-03 NOTE — Progress Notes (Signed)
Daily Progress Note Intern Pager: [phone]  Patient name: Jill [name] Medical record number: [id number] Date of birth: 29-Jun-1951 Age: 72 y.o. Gender: female  Primary Care Provider: [name], MD Consultants: None Code Status: DNR/DNI  Pt Overview and Major Events to Date:  11/26: Direct admit from clinic  Assessment and Plan:  Jill [name] is a 72 y.o. female presenting from clinic for hemoptysis.  Assessment & Plan Hemoptysis Hemodynamically stable. presenting with 4 days of bloody sputum, no change in cough or amount of sputum, chronic chest pain, associate fevers.  Increased risk of PE given malignancy.  EKG without ischemic findings.  CTPE negative for PE but showed continued known chronic loculated pleural effusion.  Outpatient follow-up. Dyspnea on exertion Respiratory status stable on room air, chronic dyspnea on exertion, fluid status euvolemic.  EKG without ischemic changes.  CTPE consistent with loculated left pleural effusion which is chronic.  PT/OT no recommendations except rollator if possible.   Follow-up rollator Adenocarcinoma of lung (HCC) Plan while inpatient to get MRI (which was scheduled outpatient for today) to follow-up routine checks for brain mets in the setting of chronic gait instability patient experiences.  Unable to perform MRI inpatient due to billing concerns.  PT OT no recommendations except rollator. Follow-up with primary oncologist including rescheduling outpatient MRI Follow-up rollator Protein-calorie malnutrition, severe Nutrition Status: Nutrition Problem: Severe Malnutrition Etiology: chronic illness Signs/Symptoms: moderate fat depletion, severe muscle depletion, energy intake < or equal to 50% for > or equal to 1 month Interventions: Boost Plus, MVI Start boost plus while inpatient, over-the-counter boost after discharge.   Chronic and Stable Issues: HLD: Continue home atorvastatin 40 daily COPD: Continue home  albuterol nebs every 6 as needed CAD: Continue home aspirin 81 daily Hypothyroidism: Continue home Synthroid 175 daily HTN: Continue home amlodipine 10 daily, losartan 100 daily, metoprolol 50 daily Constipation: Continue home senna twice daily as needed, added MiraLAX daily as needed Anxiety: Continue home Lexapro 10 daily, Klonopin 0.5 BID prn  FEN/GI: Regular diet PPx: Lovenox Dispo:Home today. Barriers include rollator.   Subjective:  Patient reports no concerns with her respiratory function today.  She reports that she is breathing at baseline and does have some dyspnea on exertion but that this is chronic and not worsened recently.  She is aware that she has pleural effusion from cancer.  Regarding her gait, she reports that she has had chronic gait instability.  She reports that she would like support for this while in the hospital and would prefer not to discharge until seeing physical therapy and other recommendations.  PT recommended rollator in the afternoon.  Regarding her MRI, we discussed that billing may not cover the MRI inpatient so that we will have to reschedule outpatient.  Discussed that she will need to follow-up with her primary oncologist for further cancer care.  Objective: Temp:  [97.8 F (36.6 C)-98.4 F (36.9 C)] 97.8 F (36.6 C) (11/27 0338) Pulse Rate:  [74-93] 78 (11/27 1056) Resp:  [16-18] 16 (11/27 0757) BP: (116-133)/(62-70) 124/66 (11/27 1056) SpO2:  [92 %-95 %] 93 % (11/27 1056) Physical Exam: General: Calm and cooperative.  Sitting in bed comfortably and not in acute distress.  Has a central venous catheter on her chest that appears clean and intact. Cardiovascular: Normal rate and rhythm.  Systolic murmur. Respiratory: Shallow breaths.  Lungs clear to auscultation bilaterally.  Normal effort. Abdomen: No tenderness to palpation. Extremities: Trace nonpitting lower extremity edema.  No lower extremity  skin lesions.  Laboratory: Most recent  CBC Lab Results  Component Value Date   WBC 8.1 10/03/2023   HGB 8.7 (L) 10/03/2023   HCT 28.4 (L) 10/03/2023   MCV 93.1 10/03/2023   PLT 325 10/03/2023   Most recent BMP    Latest Ref Rng & Units 10/03/2023    3:03 AM  BMP  Glucose 70 - 99 mg/dL 161   BUN 8 - 23 mg/dL 11   Creatinine 0.96 - 1.00 mg/dL 0.45   Sodium 409 - 811 mmol/L 138   Potassium 3.5 - 5.1 mmol/L 4.4   Chloride 98 - 111 mmol/L 102   CO2 22 - 32 mmol/L 29   Calcium 8.9 - 10.3 mg/dL 9.0     Meryl Dare, MD 10/03/2023, 12:01 PM  PGY-1, [facility] Family Medicine FPTS Intern pager: [phone], text pages welcome Secure chat group [facility] Teaching Service

## 2023-10-03 NOTE — Assessment & Plan Note (Signed)
Nutrition Status: Nutrition Problem: Severe Malnutrition Etiology: chronic illness Signs/Symptoms: moderate fat depletion, severe muscle depletion, energy intake < or equal to 50% for > or equal to 1 month Interventions: Boost Plus, MVI Start boost plus while inpatient, over-the-counter boost after discharge.

## 2023-10-03 NOTE — Assessment & Plan Note (Addendum)
Plan while inpatient to get MRI (which was scheduled outpatient for today) to follow-up routine checks for brain mets in the setting of chronic gait instability patient experiences.  Unable to perform MRI inpatient due to billing concerns.  PT OT no recommendations except rollator. Follow-up with primary oncologist including rescheduling outpatient MRI Follow-up rollator

## 2023-10-03 NOTE — Progress Notes (Signed)
Initial Nutrition Assessment  DOCUMENTATION CODES:   Severe malnutrition in context of chronic illness  INTERVENTION:  Continue with current diet Boost Plus po TID, each supplement provides 360 kcal and 14 grams of protein.     NUTRITION DIAGNOSIS:   Severe Malnutrition related to chronic illness as evidenced by moderate fat depletion, severe muscle depletion, energy intake < or equal to 50% for > or equal to 1 month.    GOAL:   Patient will meet greater than or equal to 90% of their needs    MONITOR:   PO intake, Supplement acceptance, Weight trends  REASON FOR ASSESSMENT:   Consult Assessment of nutrition requirement/status  ASSESSMENT: 72 y.o. f, currently in observation on 2W for hemoptysis. PMH; CAD, HLD, HTN, GERD, Anxiety, Depression, COPD, Cancer, poor dental hygiene. Patient alert and oriented to place and time.  Stated that 2 years ago she was 160#. Got diagnosed with cancer and lost weight. She stated she has good days and bad days with meal intake. Anxious to hear about biopsy results for possible cancer again. Discussed ONS and she stated that she drinks boost at home, and tolerates them with no concerns or issues. Agreed to drink them while her at hospital.  NFPE; revealed severe malnutrition Moderate to severe fat and muscle depletion. Missing, broken, rotting teeth. Reports no chewing concerns.  Admit weight: 57.4 kg Current weight: 57.4 kg  Weight history: 10/02/23 57.4 kg  10/02/23 57.2 kg  08/07/23 60.8 kg  07/31/23 60.6 kg  07/17/23 62.2 kg  06/27/23 62.8 kg  06/06/23 63 kg  05/16/23 63.2 kg  04/24/23 63.6 kg  04/04/23 63.6 kg      Average Meal Intake: Breakfast less than 25%  Nutritionally Relevant Medications: Scheduled Meds:  amLODipine  10 mg Oral Daily   atorvastatin  40 mg Oral Daily   folic acid  1 mg Oral Daily    Labs Reviewed    NUTRITION - FOCUSED PHYSICAL EXAM:  Flowsheet Row Most Recent Value  Orbital Region Severe  depletion  Upper Arm Region Moderate depletion  Thoracic and Lumbar Region Moderate depletion  Buccal Region Moderate depletion  Temple Region Severe depletion  Clavicle Bone Region Moderate depletion  Clavicle and Acromion Bone Region Moderate depletion  Scapular Bone Region Moderate depletion  Dorsal Hand Severe depletion  Patellar Region Moderate depletion  Anterior Thigh Region Severe depletion  Posterior Calf Region Severe depletion  Edema (RD Assessment) None  Hair Reviewed  Eyes Reviewed  Mouth Reviewed  Skin Reviewed  Nails Reviewed       Diet Order:   Diet Order             Diet regular Room service appropriate? Yes; Fluid consistency: Thin  Diet effective now                   EDUCATION NEEDS:   Education needs have been addressed  Skin:  Skin Assessment: Reviewed RN Assessment  Last BM:  10/01/23  Height:   Ht Readings from Last 1 Encounters:  10/02/23 5\' 6"  (1.676 m)    Weight:   Wt Readings from Last 1 Encounters:  10/02/23 57.4 kg    Ideal Body Weight:     BMI:  Body mass index is 20.42 kg/m.  Estimated Nutritional Needs:   Kcal:  1750-2025 kcal/d  Protein:  75-90 g/d  Fluid:  43ml/kcal    Jamelle Haring RDN, LDN Clinical Dietitian  RDN pager # available on Amion

## 2023-10-03 NOTE — Evaluation (Signed)
Physical Therapy Evaluation Patient Details Name: Jill [name] MRN: [id number] DOB: 11/25/1950 Today's Date: 10/03/2023  History of Present Illness  Pt is a 72 y/o F presenting to ED On 11/26 with hemoptysis. CTA negative for PE, suspicion for lung CA progression. Workup ongoing. PMH includes lung adenocarcinoma, CAD, COPD, CABG, AVM brain, GERD, HLD, HTN, hypothyroidism, MI   Clinical Impression  Pt admitted with above diagnosis. Reports she is a limited ambulator at home, short distances only. Pt able to ambulate 225 feet today with supervision for safety. SpO2 93% on RA, HR in 90s. Mild instability but self-corrects. Would benefit from rollator for balance and energy conservation. Likely very close to baseline but will follow acutely for any changes or updated recs as indicated. Pt currently with functional limitations due to the deficits listed below (see PT Problem List). Pt will benefit from acute skilled PT to increase their independence and safety with mobility to allow discharge.           If plan is discharge home, recommend the following: Assistance with cooking/housework;Assist for transportation   Can travel by private Data processing manager (4 wheels)  Recommendations for Other Services       Functional Status Assessment Patient has had a recent decline in their functional status and demonstrates the ability to make significant improvements in function in a reasonable and predictable amount of time.     Precautions / Restrictions Precautions Precautions: None Restrictions Weight Bearing Restrictions: No      Mobility  Bed Mobility Overal bed mobility: Independent                  Transfers Overall transfer level: Needs assistance Equipment used: None Transfers: Sit to/from Stand Sit to Stand: Supervision           General transfer comment: Supervision for safety, no overt LOB noted.     Ambulation/Gait Ambulation/Gait assistance: Supervision Gait Distance (Feet): 225 Feet Assistive device: None Gait Pattern/deviations: Step-through pattern, Decreased stride length, Drifts right/left, Staggering right Gait velocity: dec Gait velocity interpretation: <1.8 ft/sec, indicate of risk for recurrent falls   General Gait Details: Minor instability, declines SPC use but would be open to rollator with future bouts. Educated on awareness, gait symmetry, and safety. No buckling, able to self correct. SpO2 93%. RPE 7/10.  Stairs            Wheelchair Mobility     Tilt Bed    Modified Rankin (Stroke Patients Only)       Balance Overall balance assessment: Mild deficits observed, not formally tested                                           Pertinent Vitals/Pain Pain Assessment Pain Assessment: Faces Faces Pain Scale: Hurts a little bit Pain Location: L side Pain Descriptors / Indicators: Discomfort, Cramping Pain Intervention(s): Monitored during session, Repositioned    Home Living Family/patient expects to be discharged to:: Private residence Living Arrangements: Spouse/significant other;Children Available Help at Discharge: Family;Available 24 hours/day Type of Home: House Home Access: Stairs to enter   Entrance Stairs-Number of Steps: 1 Alternate Level Stairs-Number of Steps: 5-15 Home Layout: Multi-level ("story and a half") Home Equipment: None      Prior Function Prior Level of Function : Independent/Modified Independent  Mobility Comments: furniture walks, reports short distance ambulation to bathroom and back ADLs Comments: assist for bathing, reports needing frequent rest breaks with ADLs, manages own meds     Extremity/Trunk Assessment   Upper Extremity Assessment Upper Extremity Assessment: Defer to OT evaluation    Lower Extremity Assessment Lower Extremity Assessment: Generalized weakness     Cervical / Trunk Assessment Cervical / Trunk Assessment: Normal  Communication   Communication Communication: No apparent difficulties  Cognition Arousal: Alert Behavior During Therapy: WFL for tasks assessed/performed Overall Cognitive Status: Within Functional Limits for tasks assessed                                          General Comments General comments (skin integrity, edema, etc.): HR in 90s with activity. SpO2 on RA 93% throughout session.    Exercises General Exercises - Lower Extremity Ankle Circles/Pumps: AROM, Both, 10 reps, Supine Quad Sets: Strengthening, Both, 10 reps, Supine Gluteal Sets: Strengthening, Both, 10 reps, Supine Long Arc Quad: Strengthening, Both, 10 reps, Seated   Assessment/Plan    PT Assessment Patient needs continued PT services  PT Problem List Decreased strength;Decreased activity tolerance;Decreased balance;Decreased mobility;Decreased coordination;Decreased knowledge of use of DME;Cardiopulmonary status limiting activity       PT Treatment Interventions DME instruction;Gait training;Functional mobility training;Therapeutic activities;Therapeutic exercise;Balance training;Stair training;Neuromuscular re-education;Patient/family education    PT Goals (Current goals can be found in the Care Plan section)  Acute Rehab PT Goals Patient Stated Goal: Get stronger PT Goal Formulation: With patient Time For Goal Achievement: 10/17/23 Potential to Achieve Goals: Good    Frequency Min 1X/week     Co-evaluation               AM-PAC PT "6 Clicks" Mobility  Outcome Measure Help needed turning from your back to your side while in a flat bed without using bedrails?: None Help needed moving from lying on your back to sitting on the side of a flat bed without using bedrails?: None Help needed moving to and from a bed to a chair (including a wheelchair)?: A Little Help needed standing up from a chair using your arms  (e.g., wheelchair or bedside chair)?: A Little Help needed to walk in hospital room?: A Little Help needed climbing 3-5 steps with a railing? : A Little 6 Click Score: 20    End of Session Equipment Utilized During Treatment: Gait belt Activity Tolerance: Patient tolerated treatment well Patient left: in bed;with call bell/phone within reach;with bed alarm set   PT Visit Diagnosis: Other abnormalities of gait and mobility (R26.89);Muscle weakness (generalized) (M62.81)    Time: 8295-6213 PT Time Calculation (min) (ACUTE ONLY): 17 min   Charges:   PT Evaluation $PT Eval Low Complexity: 1 Low   PT General Charges $$ ACUTE PT VISIT: 1 Visit         Kathlyn [place], PT, DPT [facility]  Rehabilitation Services Physical Therapist Office: [phone] Website: [email or web address]   Berton Mount 10/03/2023, 1:20 PM

## 2023-10-03 NOTE — Progress Notes (Signed)
    Durable Medical Equipment  (From admission, onward)           Start     Ordered   10/03/23 1635  For home use only DME 4 wheeled rolling walker with seat  Once       Question:  Patient needs a walker to treat with the following condition  Answer:  Generalized weakness   10/03/23 1635   10/03/23 1127  For home use only DME Other see comment  Once       Comments: Rollator.  Question:  Length of Need  Answer:  6 Months   10/03/23 1126

## 2023-10-03 NOTE — Progress Notes (Signed)
Transition of Care [facility]) - Inpatient Brief Assessment   Patient Details  Name: Jill [name] MRN: [id number] Date of Birth: [birth year]  Transition of Care [facility]) CM/SW Contact:    Janae Bridgeman, RN Phone Number: 10/03/2023, 4:36 PM   Clinical Narrative: Patient admitted for coughing up blood.  Patient plans to return home tomorrow by family car.  Patient needs Rolator and OUtpatient PT.  Patient declined OUtpatient PT referral.  Patient did not have a preference for DME company.  I called [employer] and requested delivery of rolator to the patinet's hospital room for her pending discharge to home tomorrow.  Insurance does not cover a shower seat.  I asked that patient has her family order a shower seat online.  No other TOC needs and patient plans to return home tomorrow with family.  Patient is on room air.   Transition of Care Asessment: Insurance and Status: (P) Insurance coverage has been reviewed Patient has primary care physician: (P) Yes Home environment has been reviewed: (P) from home with family Prior level of function:: (P) Independent Prior/Current Home Services: (P) No current home services Social Determinants of Health Reivew: (P) SDOH reviewed interventions complete Readmission risk has been reviewed: (P) Yes Transition of care needs: (P) transition of care needs identified, TOC will continue to follow

## 2023-10-03 NOTE — Assessment & Plan Note (Addendum)
Hemodynamically stable. presenting with 4 days of bloody sputum, no change in cough or amount of sputum, chronic chest pain, associate fevers.  Increased risk of PE given malignancy.  EKG without ischemic findings.  CTPE negative for PE but showed continued known chronic loculated pleural effusion.  Outpatient follow-up.

## 2023-10-03 NOTE — Evaluation (Signed)
Occupational Therapy Evaluation Patient Details Name: Jill [name] MRN: [id number] DOB: Jun 30, 1951 Today's Date: 10/03/2023   History of Present Illness Pt is a 72 y/o F presenting to ED On 11/26 with hemoptysis. CTA negative for PE, suspicion for lung CA progression. Workup ongoing. PMH includes lung adenocarcinoma, CAD, COPD, CABG, AVM brain, GERD, HLD, HTN, hypothyroidism, MI   Clinical Impression   Pt reports having some assist at baseline for bathing, and ambulates short distances to bathroom and back within the home. Lives with spouse and family. Pt currently limited by fatigue, overall needs set up - supervision for ADLs, ind with bed mobility and supervision for transfers without AD. HR mid 80's at end of session. Educated pt on use of shower seat for home for energy conservation and pt verbalized understanding. Pt presenting with impairments listed below, will follow acutely. Anticipate no OT follow up needs at d/c.        If plan is discharge home, recommend the following: Assistance with cooking/housework;Assist for transportation;A little help with bathing/dressing/bathroom    Functional Status Assessment  Patient has had a recent decline in their functional status and demonstrates the ability to make significant improvements in function in a reasonable and predictable amount of time.  Equipment Recommendations  Tub/shower seat (or BSC as shower seat if unable to get shower seat)    Recommendations for Other Services PT consult     Precautions / Restrictions Precautions Precautions: None Restrictions Weight Bearing Restrictions: No      Mobility Bed Mobility Overal bed mobility: Independent                  Transfers Overall transfer level: Needs assistance Equipment used: None Transfers: Sit to/from Stand Sit to Stand: Supervision                  Balance Overall balance assessment: No apparent balance deficits (not formally assessed)                                          ADL either performed or assessed with clinical judgement   ADL Overall ADL's : Needs assistance/impaired Eating/Feeding: Set up   Grooming: Set up   Upper Body Bathing: Set up   Lower Body Bathing: Supervison/ safety   Upper Body Dressing : Supervision/safety   Lower Body Dressing: Supervision/safety   Toilet Transfer: Supervision/safety   Toileting- Clothing Manipulation and Hygiene: Supervision/safety       Functional mobility during ADLs: Supervision/safety       Vision   Vision Assessment?: No apparent visual deficits     Perception Perception: Not tested       Praxis Praxis: Not tested       Pertinent Vitals/Pain Pain Assessment Pain Assessment: Faces Pain Score: 4  Faces Pain Scale: Hurts little more Pain Location: L side Pain Descriptors / Indicators: Discomfort Pain Intervention(s): Limited activity within patient's tolerance, Monitored during session, Repositioned     Extremity/Trunk Assessment Upper Extremity Assessment Upper Extremity Assessment: Generalized weakness   Lower Extremity Assessment Lower Extremity Assessment: Defer to PT evaluation   Cervical / Trunk Assessment Cervical / Trunk Assessment: Normal   Communication Communication Communication: No apparent difficulties   Cognition Arousal: Alert Behavior During Therapy: WFL for tasks assessed/performed Overall Cognitive Status: Within Functional Limits for tasks assessed  General Comments  HR 80's after session    Exercises     Shoulder Instructions      Home Living Family/patient expects to be discharged to:: Private residence Living Arrangements: Spouse/significant other;Children Available Help at Discharge: Family;Available 24 hours/day Type of Home: House Home Access: Stairs to enter [employer] of Steps: 1   Home Layout: Multi-level ("story and a  half") Alternate Level Stairs-Number of Steps: 5-15   Bathroom Shower/Tub: Tub/shower unit         Home Equipment: None          Prior Functioning/Environment Prior Level of Function : Independent/Modified Independent             Mobility Comments: furniture walks, reports short distance ambulation to bathroom and back ADLs Comments: assist for bathing, reports needing frequent rest breaks with ADLs, manages own meds        OT Problem List: Decreased activity tolerance      OT Treatment/Interventions: Self-care/ADL training;Therapeutic exercise;Energy conservation;DME and/or AE instruction;Therapeutic activities;Patient/family education;Balance training    OT Goals(Current goals can be found in the care plan section) Acute Rehab OT Goals Patient Stated Goal: none seated OT Goal Formulation: With patient Time For Goal Achievement: 10/17/23 Potential to Achieve Goals: Good  OT Frequency: Min 1X/week    Co-evaluation              AM-PAC OT "6 Clicks" Daily Activity     Outcome Measure Help from another person eating meals?: None Help from another person taking care of personal grooming?: A Little Help from another person toileting, which includes using toliet, bedpan, or urinal?: A Little Help from another person bathing (including washing, rinsing, drying)?: A Little Help from another person to put on and taking off regular upper body clothing?: A Little Help from another person to put on and taking off regular lower body clothing?: A Little 6 Click Score: 19   End of Session Nurse Communication: Mobility status  Activity Tolerance: Patient tolerated treatment well Patient left: in bed;with call bell/phone within reach  OT Visit Diagnosis: Muscle weakness (generalized) (M62.81)                Time: 9528-4132 OT Time Calculation (min): 19 min Charges:  OT General Charges $OT Visit: 1 Visit OT Evaluation $OT Eval Low Complexity: 1 Low  Adrien Shankar K, OTD,  OTR/L SecureChat Preferred Acute Rehab [phone]  Leily Capek K Koonce 10/03/2023, 9:47 AM

## 2023-10-03 NOTE — Plan of Care (Signed)

## 2023-10-03 NOTE — Assessment & Plan Note (Addendum)
Respiratory status stable on room air, chronic dyspnea on exertion, fluid status euvolemic.  EKG without ischemic changes.  CTPE consistent with loculated left pleural effusion which is chronic.  PT/OT no recommendations except rollator if possible.   Follow-up rollator

## 2023-10-04 DIAGNOSIS — C3490 Malignant neoplasm of unspecified part of unspecified bronchus or lung: Secondary | ICD-10-CM

## 2023-10-04 DIAGNOSIS — R042 Hemoptysis: Secondary | ICD-10-CM | POA: Diagnosis not present

## 2023-10-04 DIAGNOSIS — E43 Unspecified severe protein-calorie malnutrition: Secondary | ICD-10-CM

## 2023-10-04 MED ORDER — HEPARIN SOD (PORK) LOCK FLUSH 100 UNIT/ML IV SOLN
500.0000 [IU] | INTRAVENOUS | Status: AC | PRN
  Administered 2023-10-04: 500 [IU]

## 2023-10-04 MED ORDER — ACETAMINOPHEN 325 MG PO TABS
650.0000 mg | ORAL_TABLET | Freq: Once | ORAL | Status: AC
  Administered 2023-10-04: 650 mg via ORAL
  Filled 2023-10-04: qty 2

## 2023-10-04 MED ORDER — BOOST PLUS PO LIQD: 237.0000 mL | Freq: Three times a day (TID) | ORAL | Status: DC

## 2023-10-04 NOTE — TOC Transition Note (Signed)
Transition of Care [facility]) - CM/SW Discharge Note   Patient Details  Name: Jill [name] MRN: [id number] Date of Birth: 1951-07-10  Transition of Care [facility]) CM/SW Contact:  Tom-Johnson, Hershal Coria, RN Phone Number: 10/04/2023, 9:46 AM   Clinical Narrative:     Patient is scheduled for discharge today.  Outpatient f/u, hospital f/u and discharge instructions on AVS. Patient declined Outpatient PT recommendation. Daughter, Willaim Sheng to transport at discharge.  No further TOC needs noted.          Final next level of care: Home/Self Care (Declined Outpatient PT) Barriers to Discharge: Barriers Resolved   Patient Goals and CMS Choice CMS Medicare.gov Compare Post Acute Care list provided to:: Patient Choice offered to / list presented to : Patient  Discharge Placement                  Patient to be transferred to facility by: Daughter Name of family member notified: [name]    Discharge Plan and Services Additional resources added to the After Visit Summary for                            [facility] Arranged: NA HH Agency: NA        Social Determinants of Health (SDOH) Interventions SDOH Screenings   Food Insecurity: No Food Insecurity (10/02/2023)  Housing: Low Risk  (10/02/2023)  Transportation Needs: No Transportation Needs (10/02/2023)  Utilities: Not At Risk (10/02/2023)  Alcohol Screen: Low Risk  (02/13/2023)  Depression (PHQ2-9): High Risk (10/02/2023)  Physical Activity: Inactive (02/13/2023)  Tobacco Use: High Risk (10/02/2023)     Readmission Risk Interventions     No data to display

## 2023-10-04 NOTE — Discharge Summary (Addendum)
Family Medicine Teaching [facility] Discharge Summary  Patient name: Jill [name] Medical record number: [id number] Date of birth: Dec 10, 1950 Age: 72 y.o. Gender: female Date of Admission: 10/02/2023  Date of Discharge: 10/04/2023 Admitting Physician: [name], MD  Primary Care Provider: [name], MD Consultants: Dietician  Indication for Hospitalization: Hemoptysis   Brief Hospital Course:  Jill [name] is a 72 y.o.female with a history of lung cancer and COPD who was admitted to the family medicine teaching Service at [facility] for hemoptysis. Her hospital course is detailed below:  Hemoptysis  Stage IV Adenocarcinoma of the lung  Patient's hemoglobin was 9.0 on admission, down slightly from her baseline of 10-11. She had ongoing cough with scant bloody sputum, but remained hemodynamically stable throughout admission. CTA PE was negative for PE and there was no evidence to suggest acute  pneumonia. Her known adenocarcinoma of the lung was presumed to be the source of her hemoptysis.  We spoke with the oncologist on call who felt this would be appropriate for outpatient follow-up. The patient was able to ambulate in the hallway and maintained sats on room air. She has a known left-sided pleural effusion and thoracentesis was considered for symptomatic benefit but the patient ultimately declined.  Of note, patient was scheduled for an MRI of the brain on 11/27 to monitor the treatment response of her known brain mets. This will need to be rescheduled.    PCP Follow-up Recommendations: Please assist patient in rescheduling her MRI brain Repeat hemoglobin to ensure it has not dropped further. At discharge it was 8.7 Patient should keep follow-up with Dr. Arbutus Ped  Discharge Diagnoses/Problem List: Principal Problem:   Hemoptysis Active Problems:   Adenocarcinoma of lung (HCC)   Dyspnea on exertion   Pleural effusion   Protein-calorie malnutrition,  severe   Disposition: Home   Discharge Condition: Stable, at [place]  Discharge Exam:  Gen: Thin, frail female sitting up in bed, NAD. Conversant and in good spirits Cardio: RRR, 2/6 systolic murmur Pulm: Speaking in full sentences, poor airmovement throughout but no foci of aberrant lung sounds  Abd: Non-tender and non-distended  Significant Procedures: none  Significant Labs and Imaging:  Recent Labs  Lab 10/02/23 1831 10/03/23 0303  WBC 7.7 8.1  HGB 9.0* 8.7*  HCT 28.7* 28.4*  PLT 316 325   Recent Labs  Lab 10/02/23 1831 10/03/23 0303  NA 138 138  K 4.1 4.4  CL 100 102  CO2 30 29  GLUCOSE 99 108*  BUN 9 11  CREATININE 0.74 0.71  CALCIUM 9.0 9.0    CTA Chest   FINDINGS: Cardiovascular: Excellent contrast bolus timing in the pulmonary arterial tree. Chronic mildly increased central pulmonary artery size compatible with a degree of pulmonary artery hypertension. No pulmonary artery filling defect identified.   Stable CABG. Calcified aortic atherosclerosis. Stable heart size. No pericardial effusion. Right chest Port-A-Cath partially visible as before.   Mediastinum/Nodes: Mediastinum partially adherent to abnormal left lung and the left chest wall, unchanged. No distinct mediastinal lymphadenopathy. No infiltrative mediastinal soft tissue is evident.   Lungs/Pleura: Abnormal left hemithorax with masslike architectural distortion and traction bronchiectasis, cavitation in the left upper lobe does not appear significantly changed since September. Enhancing probable round atelectasis in the anterior basal segment of the left lower lobe appears stable. Partially layering, partially loculated and partially sub pulmonic left pleural effusion. Since September (series 6, image 128) and has complex fluid density. No obvious pleural thickening or enhancement with  the early CTA contrast timing utilized today.   Underlying emphysema. Central airways are patent,  stable. Stable right lung since September. The sub pulmonic component appears larger   Upper Abdomen: Negative visible liver, gallbladder, spleen, pancreas, adrenal glands, kidneys, and bowel in the upper abdomen.   Musculoskeletal: Median sternotomy. Chronic left anterior 3rd and 4th rib fractures have a stable and benign appearance. No acute or suspicious osseous lesion is identified.   Review of the MIP images confirms the above findings.   IMPRESSION: 1. Negative for acute pulmonary embolus. 2. Emphysema (ICD10-J43.9). Chronic post treatment left lung appearance with architectural distortion and adhesion. Increased since September subpulmonic component of otherwise partially layering, partially loculated left pleural effusion. Complex fluid density. Otherwise stable lungs. 3. No mediastinal metastasis identified.   Discharge Medications:  Allergies as of 10/04/2023       Reactions   Varenicline Other (See Comments)   Reports change in mood with increased irritability and homicidal ideation.  2007    Ace Inhibitors Cough   Prednisone Other (See Comments)   Crying, disorientation - occurred in 1994 Rage   Betalin 12 [vitamin B12] Hives, Itching   Penicillins Rash   Reaction: Childhood   Sulfa Drugs Cross Reactors Rash        Medication List     TAKE these medications    acetaminophen 500 MG tablet Commonly known as: TYLENOL Take 500-1,000 mg by mouth every 6 (six) hours as needed for moderate pain (pain score 4-6).   albuterol 108 (90 Base) MCG/ACT inhaler Commonly known as: VENTOLIN HFA Inhale 2 puffs into the lungs every 6 (six) hours as needed for wheezing or shortness of breath.   albuterol (2.5 MG/3ML) 0.083% nebulizer solution Commonly known as: PROVENTIL Take 3 mLs (2.5 mg total) by nebulization every 6 (six) hours as needed for wheezing or shortness of breath.   amLODipine 10 MG tablet Commonly known as: NORVASC TAKE 1 TABLET BY MOUTH ONCE DAILY  . APPOINTMENT REQUIRED FOR FUTURE REFILLS   aspirin EC 81 MG tablet Take 1 tablet (81 mg total) by mouth daily.   atorvastatin 40 MG tablet Commonly known as: LIPITOR TAKE 1 TABLET BY MOUTH EVERY DAY   clonazePAM 0.5 MG tablet Commonly known as: KLONOPIN Take 1 tablet (0.5 mg total) by mouth 2 (two) times daily as needed for anxiety.   escitalopram 10 MG tablet Commonly known as: LEXAPRO TAKE 1 TABLET BY MOUTH EVERY DAY   folic acid 1 MG tablet Commonly known as: FOLVITE TAKE 1 TABLET BY MOUTH EVERY DAY   lactose free nutrition Liqd Take 237 mLs by mouth 3 (three) times daily with meals.   levothyroxine 175 MCG tablet Commonly known as: SYNTHROID TAKE 1 TABLET BY MOUTH DAILY BEFORE BREAKFAST.   lidocaine-prilocaine cream Commonly known as: EMLA Apply 1 Application topically as needed.   losartan 100 MG tablet Commonly known as: COZAAR TAKE 1 TABLET BY MOUTH EVERY DAY   metoprolol succinate 50 MG 24 hr tablet Commonly known as: TOPROL-XL TAKE 1 TABLET BY MOUTH ONCE DAILY WITH MEALS OR IMMEDIATLEY AFTER A MEAL   multivitamin with minerals tablet Take 1 tablet by mouth daily.   nitroGLYCERIN 0.4 MG SL tablet Commonly known as: NITROSTAT Place 1 tablet (0.4 mg total) under the tongue every 5 (five) minutes as needed. For chest pain   prochlorperazine 10 MG tablet Commonly known as: COMPAZINE TAKE 1 TABLET BY MOUTH EVERY 6 HOURS AS NEEDED NAUSEA AND VOMITING   senna 8.6 MG  Tabs tablet Commonly known as: SENOKOT Take 1 tablet (8.6 mg total) by mouth 2 (two) times daily as needed for mild constipation.   VITAMIN A PO Take 1 tablet by mouth daily.   VITAMIN D3 PO Take 1 tablet by mouth daily.               Durable Medical Equipment  (From admission, onward)           Start     Ordered   10/03/23 1637  For home use only DME 4 wheeled rolling walker with seat  Once       Question:  Patient needs a walker to treat with the following condition   Answer:  Generalized weakness   10/03/23 1636   10/03/23 1637  For home use only DME Other see comment  Once       Comments: Rollator.  Question:  Length of Need  Answer:  6 Months   10/03/23 1637            Discharge Instructions: Please refer to Patient Instructions section of EMR for full details.  Patient was counseled important signs and symptoms that should prompt return to medical care, changes in medications, dietary instructions, activity restrictions, and follow up appointments.   Follow-Up Appointments:  Follow-up Information     [facility] Follow up.   Why: [facility] will deliver a rolator to the patient's hospital room today, 10/03/23. Contact information: [address] [place] [address] [phone]         [name], MD. Go on 10/09/2023.   Specialty: Family Medicine Why: You have an appointment with Dr. Lum Babe at 9:10am. Please arrive 15 minutes early. Contact information: [address] [place] [address] [phone]                 Alicia Amel, MD 10/04/2023, 5:38 AM PGY-3, [facility]

## 2023-10-04 NOTE — Care Management Obs Status (Signed)
MEDICARE OBSERVATION STATUS NOTIFICATION   Patient Details  Name: Jill [name] MRN: [id number] Date of Birth: 02-24-1951   Medicare Observation Status Notification Given:  Yes    Tom-Johnson, Hershal Coria, RN 10/04/2023, 9:04 AM

## 2023-10-05 ENCOUNTER — Telehealth: Payer: Self-pay

## 2023-10-05 NOTE — Transitions of Care (Post Inpatient/ED Visit) (Signed)
   10/05/2023  Name: Jill [name] MRN: [id number] DOB: 1951-09-24  Today's TOC FU Call Status: Today's TOC FU Call Status:: Unsuccessful Call (1st Attempt) Unsuccessful Call (1st Attempt) Date: 10/05/23  Attempted to reach the patient regarding the most recent Inpatient/ED visit.  Follow Up Plan: Additional outreach attempts will be made to reach the patient to complete the Transitions of Care (Post Inpatient/ED visit) call.   Signature  Kandis Fantasia, LPN [facility] Health Advisor [facility] You Are. We Are. [facility] Direct Dial [phone] +

## 2023-10-08 ENCOUNTER — Other Ambulatory Visit: Payer: Self-pay | Admitting: Urology

## 2023-10-08 ENCOUNTER — Ambulatory Visit: Payer: Medicare HMO | Admitting: Radiology

## 2023-10-08 ENCOUNTER — Inpatient Hospital Stay: Payer: Medicare HMO

## 2023-10-08 ENCOUNTER — Other Ambulatory Visit: Payer: Self-pay | Admitting: Family Medicine

## 2023-10-08 ENCOUNTER — Telehealth: Payer: Self-pay

## 2023-10-08 ENCOUNTER — Encounter: Payer: Self-pay | Admitting: Family Medicine

## 2023-10-08 DIAGNOSIS — C7931 Secondary malignant neoplasm of brain: Secondary | ICD-10-CM

## 2023-10-08 DIAGNOSIS — C3490 Malignant neoplasm of unspecified part of unspecified bronchus or lung: Secondary | ICD-10-CM

## 2023-10-08 MED ORDER — CLONAZEPAM 0.5 MG PO TABS: 0.5000 mg | ORAL_TABLET | Freq: Two times a day (BID) | ORAL | 1 refills | Status: DC | PRN

## 2023-10-08 NOTE — Telephone Encounter (Signed)
Patient calls nurse line requesting a refill on Clonazepam prescription.   She reports PCP was supposed to refill this at office visit last week, however she was ultimately direct admitted.   She reports PCP was also going to send her in an antibiotic, however she is unsure if this is something she received at the hospital and is no longer needed.   Confirmed PCP apt for tomorrow 12/3.  Will forward to PCP.

## 2023-10-09 ENCOUNTER — Telehealth: Payer: Medicare HMO | Admitting: Family Medicine

## 2023-10-09 ENCOUNTER — Encounter: Payer: Self-pay | Admitting: Family Medicine

## 2023-10-09 ENCOUNTER — Other Ambulatory Visit: Payer: Medicare HMO

## 2023-10-09 DIAGNOSIS — F32A Depression, unspecified: Secondary | ICD-10-CM

## 2023-10-09 DIAGNOSIS — D649 Anemia, unspecified: Secondary | ICD-10-CM

## 2023-10-09 DIAGNOSIS — J449 Chronic obstructive pulmonary disease, unspecified: Secondary | ICD-10-CM

## 2023-10-09 DIAGNOSIS — E43 Unspecified severe protein-calorie malnutrition: Secondary | ICD-10-CM | POA: Diagnosis not present

## 2023-10-09 DIAGNOSIS — F17210 Nicotine dependence, cigarettes, uncomplicated: Secondary | ICD-10-CM | POA: Diagnosis not present

## 2023-10-09 DIAGNOSIS — E079 Disorder of thyroid, unspecified: Secondary | ICD-10-CM

## 2023-10-09 DIAGNOSIS — C3412 Malignant neoplasm of upper lobe, left bronchus or lung: Secondary | ICD-10-CM | POA: Diagnosis not present

## 2023-10-09 DIAGNOSIS — Z720 Tobacco use: Secondary | ICD-10-CM

## 2023-10-09 DIAGNOSIS — C3490 Malignant neoplasm of unspecified part of unspecified bronchus or lung: Secondary | ICD-10-CM

## 2023-10-09 DIAGNOSIS — J90 Pleural effusion, not elsewhere classified: Secondary | ICD-10-CM

## 2023-10-09 DIAGNOSIS — C7931 Secondary malignant neoplasm of brain: Secondary | ICD-10-CM | POA: Diagnosis not present

## 2023-10-09 DIAGNOSIS — R269 Unspecified abnormalities of gait and mobility: Secondary | ICD-10-CM | POA: Diagnosis not present

## 2023-10-09 DIAGNOSIS — E039 Hypothyroidism, unspecified: Secondary | ICD-10-CM | POA: Diagnosis not present

## 2023-10-09 MED ORDER — ANORO ELLIPTA 62.5-25 MCG/ACT IN AEPB: 1.0000 | INHALATION_SPRAY | Freq: Every day | RESPIRATORY_TRACT | 1 refills | Status: DC

## 2023-10-09 MED ORDER — BOOST PLUS PO LIQD: 237.0000 mL | Freq: Two times a day (BID) | ORAL | 2 refills | Status: DC

## 2023-10-09 NOTE — Assessment & Plan Note (Signed)
Improved on current regimen She declined CBT referral to Psychology/Psych

## 2023-10-09 NOTE — Assessment & Plan Note (Signed)
Counseling provided

## 2023-10-09 NOTE — Progress Notes (Signed)
[facility] Telemedicine Visit  Patient consented to have virtual visit and was identified by name and date of birth. Method of visit: Video  Encounter participants: Patient: Jill [name] - located at Home Provider: Janit Pagan - located at [facility] office Others (if applicable): Syliva Overman [employer] [facility] medical student) attended this visit with me with the patient's permission  Chief Complaint: HFU  HPI:  HFU/COPD/Cough: She was admitted to the hospital for Cough and Hemoptysis. She endorsed feeling better. The cough has improved with no hemoptysis. She is on albuterol as needed and confirmed she was given Anoro Ellipta to take home. She is compliant with this. No new concerns.   Hypothyroid: Compliant with Synthroid 175 mcg every day. No new concerns.  Gait:  Still having issues with her gait. Tires out easily with ambulation plus dizziness. She was sent home from the hospital with rolling walker, but had not used it consistently. She denies any recent fall at home. Still prefers no home or in-office PT  Depression: She attributed this to her Lung cancer. However, she endorsed feeling much better. She is compliant with her Lexapro 10 mg every day and takes Klonopin as needed for anxiety and insomnia.  Adenocarcinoma/Pleural effusion: Denies cough or hemoptysis. She has scheduled MRI brain appointment at [facility] on 12/11 to assess for brain met.   Smoke:  She quit x 5 days following her hospitalization and restarted again 3 days ago. She smokes 1/2 PDD. She does not want any help with smoking cessation for now.   Severe malnutrition: Now on Boost energy daily. Cost her $45 for 28 bottles per month.   ROS: per HPI  Pertinent PMHx: PMHx reviewed  Exam:  There were no vitals taken for this visit.  Respiratory: She could complete full sentences without distress  Assessment/Plan:  COPD (chronic obstructive pulmonary disease) (HCC) Stable on current regimen Continue  albuterol prn Anoro refilled F/U as needed  Primary adenocarcinoma of left upper lobe of lung (HCC) Neg hemoptysis Smoking cessation counseling provided She declined smoking cessation support Monitor closely for now  Tobacco abuse Counseling provided  Pleural effusion Likely related to her lung adenocarcinoma Management per oncology and pulmonology  Primary malignant neoplasm of lung with metastasis to brain [facility]) MRI brain scheduled Management per oncology  Hypothyroidism TSH checked in the hospital and was low ?? Euthymic sick syndrome She will come in for repeat lab today We will re-dose her Synthroid based on the repeat test result  Depression Improved on current regimen She declined CBT referral to Psychology/Psych  Gait difficulty PT offered, but she declined Continue use of rolling walker F/U as needed  Severe malnutrition (HCC) Due to her chronic disease I will escribe boost to her pharmacy for potential insurance coverage She agreed with the plan   Colon cancer screening and mammogram discussed She declined mammogram screening She has cologuard card at home and will ship out soon after sample collection  Time spent during visit with patient: 20 minutes

## 2023-10-09 NOTE — Patient Instructions (Addendum)
Thyroid-Stimulating Hormone Test Why am I having this test? The thyroid is a gland in the lower front of the neck. It makes hormones that affect many body parts and systems, including the system that affects how quickly the body burns fuel for energy (metabolism). The pituitary gland is located just below the brain, behind the eyes and nasal passages. It helps maintain thyroid hormone levels and thyroid gland function. You may have a thyroid-stimulating hormone (TSH) test if you have possible symptoms of abnormal thyroid hormone levels. This test can help your health care provider: Diagnose a disorder of the thyroid gland or pituitary gland. Manage your condition and treatment if you have an underactive thyroid (hypothyroidism) or an overactive thyroid (hyperthyroidism). Newborn babies may have this test done to screen for hypothyroidism that is present at birth (congenital). What is being tested? This test measures the amount of TSH in your blood. TSH may also be called thyrotropin. When the thyroid does not make enough hormones, the pituitary gland releases TSH into the bloodstream to stimulate the thyroid gland to make more hormones. What kind of sample is taken?     A blood sample is required for this test. It is usually collected by inserting a needle into a blood vessel. For newborns, a small amount of blood may be collected from the umbilical cord, or by using a small needle to prick the baby's heel (heel stick). Tell a health care provider about: All medicines you are taking, including vitamins, herbs, eye drops, creams, and over-the-counter medicines. Any bleeding problems you have. Any surgeries you have had. Any medical conditions you have. Whether you are pregnant or may be pregnant. How are the results reported? Your test results will be reported as a value that indicates how much TSH is in your blood. Your health care provider will compare your results to normal ranges that were  established after testing a large group of people (reference ranges). Reference ranges may vary among labs and hospitals. For this test, common reference ranges are: Adult: 2-10 microunits/mL or 2-10 milliunits/L. Newborn: Heel stick: 3-18 microunits/mL or 3-18 milliunits/L. Umbilical cord: 3-12 microunits/mL or 3-12 milliunits/L. What do the results mean? Results that are within the reference range are considered normal. This means that you have a normal amount of TSH in your blood. Results that are higher than the reference range mean that your TSH levels are too high. This may mean: Your thyroid gland is not making enough thyroid hormones. Your thyroid medicine dosage is too low. You have a tumor on your pituitary gland. This is rare. Results that are lower than the reference range mean that your TSH levels are too low. This may be caused by hyperthyroidism or by a problem with the pituitary gland function. Talk with your health care provider about what your results mean. Questions to ask your health care provider Ask your health care provider, or the department that is doing the test: When will my results be ready? How will I get my results? What are my treatment options? What other tests do I need? What are my next steps? Summary You may have a thyroid-stimulating hormone (TSH) test if you have possible symptoms of abnormal thyroid hormone levels. The thyroid is a gland in the lower front of the neck. It makes hormones that affect many body parts and systems. The pituitary gland is located just below the brain, behind the eyes and nasal passages. It helps maintain thyroid hormone levels and thyroid gland function. This test   measures the amount of TSH in your blood. TSH is made by the pituitary gland. It may also be called thyrotropin. This information is not intended to replace advice given to you by your health care provider. Make sure you discuss any questions you have with your  health care provider. Document Revised: 10/25/2021 Document Reviewed: 10/25/2021 Elsevier Patient Education  2024 Elsevier Inc.  

## 2023-10-09 NOTE — Assessment & Plan Note (Signed)
MRI brain scheduled Management per oncology

## 2023-10-09 NOTE — Assessment & Plan Note (Signed)
TSH checked in the hospital and was low ?? Euthymic sick syndrome She will come in for repeat lab today We will re-dose her Synthroid based on the repeat test result

## 2023-10-09 NOTE — Assessment & Plan Note (Signed)
Due to her chronic disease I will escribe boost to her pharmacy for potential insurance coverage She agreed with the plan

## 2023-10-09 NOTE — Assessment & Plan Note (Signed)
Likely related to her lung adenocarcinoma Management per oncology and pulmonology

## 2023-10-09 NOTE — Assessment & Plan Note (Signed)
Stable on current regimen Continue albuterol prn Anoro refilled F/U as needed

## 2023-10-09 NOTE — Assessment & Plan Note (Signed)
PT offered, but she declined Continue use of rolling walker F/U as needed

## 2023-10-09 NOTE — Assessment & Plan Note (Signed)
Neg hemoptysis Smoking cessation counseling provided She declined smoking cessation support Monitor closely for now

## 2023-10-10 ENCOUNTER — Telehealth: Payer: Self-pay

## 2023-10-10 ENCOUNTER — Telehealth: Payer: Self-pay | Admitting: Family Medicine

## 2023-10-10 MED ORDER — FERROUS SULFATE 324 MG PO TBEC: 324.0000 mg | DELAYED_RELEASE_TABLET | Freq: Every day | ORAL | 1 refills | Status: AC

## 2023-10-10 NOTE — Telephone Encounter (Signed)
Anemia panel discussed. TFT pending. Her lab revealed combined iron deficiency anemia and anemia of chronic diseases. She has been off iron supplements for weeks. She just restarted this yesterday. I described iron sulfate to her pharmacy. Recheck lab in 4-8 weeks. I also advised her to discuss possible iron infusion with her oncologist - Dr. Shirline Frees. She agreed with the plan.

## 2023-10-10 NOTE — Telephone Encounter (Signed)
Pt called and LVM about upcoming appts.  Spoke with pt and she requested her lab appt on 12/11 be closer to her MRI at 4 that day.  Changed lab appt from 9 am to 3 pm.  Pt verbalized understanding.

## 2023-10-12 NOTE — Telephone Encounter (Signed)
I spoke with her. She can take the iron supplement she current have with her. TFT is still pending. Will call once I have her result. She was appreciative of the call.

## 2023-10-12 NOTE — Telephone Encounter (Signed)
Patient calls nurse line in regards to iron supplement.   She reports the pharmacy did not have Ferrous 324mg . She reports she has a bottle of OTC 325mg  at home. She reports she purchased this a few months ago and has only taken "a handful" of tablets.   She is requesting to take what she already has vs paying for another prescription.   Advised will forward to PCP.   She also requests her Thyroid test. Advised results are still pending at this time.

## 2023-10-15 DIAGNOSIS — J441 Chronic obstructive pulmonary disease with (acute) exacerbation: Secondary | ICD-10-CM | POA: Diagnosis not present

## 2023-10-16 ENCOUNTER — Telehealth: Payer: Self-pay

## 2023-10-16 NOTE — Telephone Encounter (Signed)
Patient called and had questions about lab work prior to MRI tomorrow.  Pt had recent lab work done, so LVM for patient to let her know that she does not need labs tomorrow.  Appt canceled for lab.

## 2023-10-17 ENCOUNTER — Ambulatory Visit (HOSPITAL_COMMUNITY)
Admission: RE | Admit: 2023-10-17 | Discharge: 2023-10-17 | Disposition: A | Discharge status: Home / Self Care | Payer: Medicare HMO | Source: Ambulatory Visit | Attending: Radiation Oncology | Admitting: Radiation Oncology

## 2023-10-17 ENCOUNTER — Inpatient Hospital Stay: Payer: Medicare HMO

## 2023-10-17 ENCOUNTER — Other Ambulatory Visit: Payer: Medicare HMO

## 2023-10-17 DIAGNOSIS — Q048 Other specified congenital malformations of brain: Secondary | ICD-10-CM | POA: Diagnosis not present

## 2023-10-17 DIAGNOSIS — C801 Malignant (primary) neoplasm, unspecified: Secondary | ICD-10-CM | POA: Diagnosis not present

## 2023-10-17 DIAGNOSIS — C7931 Secondary malignant neoplasm of brain: Secondary | ICD-10-CM | POA: Insufficient documentation

## 2023-10-17 MED ORDER — GADOBUTROL 1 MMOL/ML IV SOLN
5.0000 mL | Freq: Once | INTRAVENOUS | Status: AC | PRN
  Administered 2023-10-17: 5 mL via INTRAVENOUS

## 2023-10-18 ENCOUNTER — Other Ambulatory Visit: Payer: Self-pay | Admitting: Family Medicine

## 2023-10-18 ENCOUNTER — Telehealth: Payer: Self-pay | Admitting: Family Medicine

## 2023-10-18 ENCOUNTER — Telehealth: Payer: Self-pay | Admitting: Medical Oncology

## 2023-10-18 DIAGNOSIS — E039 Hypothyroidism, unspecified: Secondary | ICD-10-CM

## 2023-10-18 MED ORDER — LEVOTHYROXINE SODIUM 150 MCG PO TABS: 150.0000 ug | ORAL_TABLET | Freq: Every day | ORAL | 1 refills | Status: DC

## 2023-10-18 NOTE — Telephone Encounter (Signed)
LVM that her appt on 12/19 will be cancelled and r/s for another afternoon.

## 2023-10-18 NOTE — Telephone Encounter (Signed)
TSH remains low. Plan to reduce Synthroid to 150 from 175 mcg Med escribed F/U lab appointment scheduled for 5 weeks F/U soon if having any concerns She agreed with the plan

## 2023-10-18 NOTE — Telephone Encounter (Signed)
12/19 @ 0830-Appt conflict  -. 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 Marland Kitchen

## 2023-10-20 LAB — ANEMIA PROFILE B
Basophils Absolute: 0.1 10*3/uL (ref 0.0–0.2)
Basos: 1 %
EOS (ABSOLUTE): 0.2 10*3/uL (ref 0.0–0.4)
Eos: 2 %
Ferritin: 175 ng/mL — ABNORMAL HIGH (ref 15–150)
Folate: 19.5 ng/mL (ref >3.0)
Hematocrit: 30.2 % — ABNORMAL LOW (ref 34.0–46.6)
Hemoglobin: 9.6 g/dL — ABNORMAL LOW (ref 11.1–15.9)
Immature Grans (Abs): 0 10*3/uL (ref 0.0–0.1)
Immature Granulocytes: 0 %
Iron Saturation: 8 % — CL (ref 15–55)
Iron: 19 ug/dL — ABNORMAL LOW (ref 27–139)
Lymphocytes Absolute: 0.9 10*3/uL (ref 0.7–3.1)
Lymphs: 9 %
MCH: 29 pg (ref 26.6–33.0)
MCHC: 31.8 g/dL (ref 31.5–35.7)
MCV: 91 fL (ref 79–97)
Monocytes Absolute: 0.9 10*3/uL (ref 0.1–0.9)
Monocytes: 8 %
Neutrophils Absolute: 8.3 10*3/uL — ABNORMAL HIGH (ref 1.4–7.0)
Neutrophils: 80 %
Platelets: 457 10*3/uL — ABNORMAL HIGH (ref 150–450)
RBC: 3.31 x10E6/uL — ABNORMAL LOW (ref 3.77–5.28)
RDW: 15.1 % (ref 11.7–15.4)
Retic Ct Pct: 2.3 % (ref 0.6–2.6)
Total Iron Binding Capacity: 245 ug/dL — ABNORMAL LOW (ref 250–450)
UIBC: 226 ug/dL (ref 118–369)
Vitamin B-12: 898 pg/mL (ref 232–1245)
WBC: 10.3 10*3/uL (ref 3.4–10.8)

## 2023-10-20 LAB — TSH+T3+FREE T4+T3 FREE
Free T-3: 3.5 pg/mL
Free T4 by Dialysis: 2.7 ng/dL — ABNORMAL HIGH
TSH: 0.03 uU/mL — ABNORMAL LOW
Triiodothyronine (T-3), Serum: 124 ng/dL

## 2023-10-22 ENCOUNTER — Inpatient Hospital Stay: Payer: Medicare HMO

## 2023-10-23 NOTE — Progress Notes (Signed)
Several failed attempts at reaching patient for this call. I have called all numbers on file and left voicemails.  Telephone nursing appointment for review of most recent MRI. I verified patient's identity x2 and began nursing interview.   Patient reports ***. Patient denies any other related issues at this time.   Meaningful use complete.   Patient aware of their 11am-10/24/2023 telephone appointment w/ Gavino Fouch PA-C. I left my extension [phone] in case patient needs anything. Patient verbalized understanding. This concludes the nursing interview.   Patient contact [phone]     Ruel Favors, LPN

## 2023-10-24 ENCOUNTER — Ambulatory Visit
Admission: RE | Admit: 2023-10-24 | Discharge: 2023-10-24 | Disposition: A | Discharge status: Home / Self Care | Payer: Medicare HMO | Source: Ambulatory Visit | Attending: Urology | Admitting: Urology

## 2023-10-24 DIAGNOSIS — C7931 Secondary malignant neoplasm of brain: Secondary | ICD-10-CM | POA: Diagnosis not present

## 2023-10-24 DIAGNOSIS — C3412 Malignant neoplasm of upper lobe, left bronchus or lung: Secondary | ICD-10-CM | POA: Diagnosis not present

## 2023-10-24 DIAGNOSIS — C3490 Malignant neoplasm of unspecified part of unspecified bronchus or lung: Secondary | ICD-10-CM

## 2023-10-24 NOTE — Progress Notes (Signed)
Radiation Oncology         [phone]) [phone] ________________________________  Name: Jill [name] MRN: [id number]  Date: 10/24/2023  DOB: October 15, 1951  Post Treatment Note  CC: [name], MD  [name], MD  Diagnosis:   72 yo woman with stage IV NSCLC, adenocarcinoma of the left upper lung - Stage IVA - with brain metastases     Interval Since Last Radiation: 2 years 09/28/21: SRS//PTV2-3: The metastatic lesions  in the left occipital (3 mm) and left temporal (1 mm) were treated to 20 Gy in a single fraction  06/23/21: SRS// PTV1: The solitary 7 mm left frontal  brain metastasis was treated to 20 Gy in a single fraction   06/15/21 - 08/01/21: The primary tumor in the left lung and nodes were treated to 66 Gy in 33 fractions of 2 Gy (concurrent with chemotherapy).  Narrative:  I contacted the patient to conduct her routine scheduled 4 month follow up visit to review results of her recent MRI brain scan via telephone to spare the patient unnecessary potential exposure in the healthcare setting during the current COVID-19 pandemic.  The patient was notified in advance and gave permission to proceed with this visit format.    She tolerated her SRS treatments well and she remains without complaints.  Her recent MRI brain scan from 712/09/2023 shows a stable appearance of the residual 5 mm focus of enhancement in the left frontal lobe (previously treated lesion) with improved surrounding edema and no visible sequela of previously treated lesions in the left temporal lobe and left occipital lobe. There are no new lesions to suggest disease recurrence or progression.  We reviewed these results by telephone today.                        On review of systems, the patient states that she is doing well in general and is currently without complaints aside from some mild fatigue which is improving since completing the Keytruda immunotherpay in 09/2023. She denies any recent headaches, N/V/D. Her  weight has remained stable. She specifically denies chest pain, increased shortness of breath or hemoptysis. She has occasional, mild headaches but nothing that has been persistent or concerning.  She denies any decrease in her visual or auditory acuity, focal weakness in the upper or lower extremities, tremor or seizure activity.  She initially completed 6 cycles of systemic therapy with carboplatin/paclitaxel systemic chemotherapy prior to switching to carboplatin, Alimta and Keytruda which she completed 5 cycles of. She then changed to maintenance therapy with Alimta/Keytruda beginning 12/29/2021. The Alimta was discontinued in July 2024 due to progressive fatigue and weakness so she continued on single agent Keytruda from cycle 30-33, completed 09/2023. Her restaging CT C/A/P from 07/12/23 was stable in appearance and without evidence of disease recurrence or progression. She was having some increased shortness of breath and fatigue and underwent left-sided ultrasound-guided thoracentesis on 04/17/2023 with drainage of 1.1 L of fluids with significant improvement in her breathing and overall, she is pleased with her progress to date.    Her 12th grandchild, 5th grand-daughter, was born 11/02/2021 and brings her great joy. She enjoys spending time with her grand-kids daily which helps keep her active.  ALLERGIES:  is allergic to varenicline, ace inhibitors, prednisone, betalin 12 [vitamin b12], penicillins, and sulfa drugs cross reactors.  Meds: Current Outpatient Medications  Medication Sig Dispense Refill   acetaminophen (TYLENOL) 500 MG tablet Take 500-1,000 mg by mouth  every 6 (six) hours as needed for moderate pain (pain score 4-6). (Patient not taking: Reported on 10/09/2023)     albuterol (PROVENTIL) (2.5 MG/3ML) 0.083% nebulizer solution Take 3 mLs (2.5 mg total) by nebulization every 6 (six) hours as needed for wheezing or shortness of breath. (Patient not taking: Reported on 10/09/2023) 75 mL 12    albuterol (VENTOLIN HFA) 108 (90 Base) MCG/ACT inhaler Inhale 2 puffs into the lungs every 6 (six) hours as needed for wheezing or shortness of breath. (Patient not taking: Reported on 10/09/2023) 8 g 2   amLODipine (NORVASC) 10 MG tablet TAKE 1 TABLET BY MOUTH ONCE DAILY . APPOINTMENT REQUIRED FOR FUTURE REFILLS 90 tablet 1   aspirin EC 81 MG tablet Take 1 tablet (81 mg total) by mouth daily. 90 tablet 2   atorvastatin (LIPITOR) 40 MG tablet TAKE 1 TABLET BY MOUTH EVERY DAY 90 tablet 1   Cholecalciferol (VITAMIN D3 PO) Take 1 tablet by mouth daily. (Patient not taking: Reported on 10/09/2023)     clonazePAM (KLONOPIN) 0.5 MG tablet Take 1 tablet (0.5 mg total) by mouth 2 (two) times daily as needed for anxiety. 50 tablet 1   escitalopram (LEXAPRO) 10 MG tablet TAKE 1 TABLET BY MOUTH EVERY DAY 90 tablet 1   ferrous sulfate 324 MG TBEC Take 1 tablet (324 mg total) by mouth daily with breakfast. 90 tablet 1   folic acid (FOLVITE) 1 MG tablet TAKE 1 TABLET BY MOUTH EVERY DAY 90 tablet 0   lactose free nutrition (BOOST PLUS) LIQD Take 237 mLs by mouth 2 (two) times daily between meals. 42660 mL 2   levothyroxine (SYNTHROID) 150 MCG tablet Take 1 tablet (150 mcg total) by mouth daily before breakfast. 30 tablet 1   lidocaine-prilocaine (EMLA) cream Apply 1 Application topically as needed. (Patient not taking: Reported on 10/09/2023) 30 g 2   losartan (COZAAR) 100 MG tablet TAKE 1 TABLET BY MOUTH EVERY DAY 90 tablet 1   metoprolol succinate (TOPROL-XL) 50 MG 24 hr tablet TAKE 1 TABLET BY MOUTH ONCE DAILY WITH MEALS OR IMMEDIATLEY AFTER A MEAL 90 tablet 1   Multiple Vitamins-Minerals (MULTIVITAMIN WITH MINERALS) tablet Take 1 tablet by mouth daily. (Patient not taking: Reported on 10/09/2023)     nitroGLYCERIN (NITROSTAT) 0.4 MG SL tablet Place 1 tablet (0.4 mg total) under the tongue every 5 (five) minutes as needed. For chest pain (Patient not taking: Reported on 10/09/2023) 25 tablet 6   prochlorperazine  (COMPAZINE) 10 MG tablet TAKE 1 TABLET BY MOUTH EVERY 6 HOURS AS NEEDED NAUSEA AND VOMITING (Patient not taking: Reported on 10/09/2023) 30 tablet 0   senna (SENOKOT) 8.6 MG TABS tablet Take 1 tablet (8.6 mg total) by mouth 2 (two) times daily as needed for mild constipation. 60 tablet 0   umeclidinium-vilanterol (ANORO ELLIPTA) 62.5-25 MCG/ACT AEPB Inhale 1 puff into the lungs daily. 60 each 1   VITAMIN A PO Take 1 tablet by mouth daily. (Patient not taking: Reported on 10/09/2023)     No current facility-administered medications for this encounter.    Physical Findings:  vitals were not taken for this visit.   /10 Unable to assess due to telephone follow-up visit format.  Lab Findings: Lab Results  Component Value Date   WBC 10.3 10/09/2023   HGB 9.6 (L) 10/09/2023   HCT 30.2 (L) 10/09/2023   MCV 91 10/09/2023   PLT 457 (H) 10/09/2023     Radiographic Findings: MR Brain W Wo Contrast Result Date:  10/18/2023 CLINICAL DATA:  Brain metastases, assess treatment response 3T SRS Protocol. EXAM: MRI HEAD WITHOUT AND WITH CONTRAST TECHNIQUE: Multiplanar, multiecho pulse sequences of the brain and surrounding structures were obtained without and with intravenous contrast. CONTRAST:  5mL GADAVIST GADOBUTROL 1 MMOL/ML IV SOLN COMPARISON:  MRI 05/15/2023. FINDINGS: Brain: Stable 5 mm enhancing metastasis in the left frontal lobe. Improved surrounding T2/FLAIR hyperintensity. No new enhancing lesions identified. Additional scattered T2/FLAIR hyperintensities in the white matter are not changed and compatible with chronic microvascular ischemic disease. Similar developmental venous anomaly in the right frontal lobe, benign. No evidence of acute infarct, acute hemorrhage, midline shift or hydrocephalus. Vascular: Major arterial flow voids are maintained at the skull base. 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 Skull and upper cervical spine: Normal  marrow signal. Sinuses/Orbits: Mild paranasal sinus mucosal thickening. IMPRESSION: Stable 5 mm enhancing metastasis in the left frontal lobe. Improved surrounding edema. No new enhancing lesions identified. Electronically Signed   By: Feliberto Harts M.D.   On: 10/18/2023 12:23   CT Angio Chest Pulmonary Embolism (PE) W or WO Contrast Result Date: 10/02/2023 CLINICAL DATA:  72 year old female with increasing cough, shortness of breath, chest tightness. Chest pain. Lung cancer. EXAM: CT ANGIOGRAPHY CHEST WITH CONTRAST TECHNIQUE: Multidetector CT imaging of the chest was performed using the standard protocol during bolus administration of intravenous contrast. Multiplanar CT image reconstructions and MIPs were obtained to evaluate the vascular anatomy. RADIATION DOSE REDUCTION: This exam was performed according to the departmental dose-optimization program which includes automated exposure control, adjustment of the mA and/or kV according to patient size and/or use of iterative reconstruction technique. CONTRAST:  75mL OMNIPAQUE IOHEXOL 350 MG/ML SOLN COMPARISON:  Restaging chest CT 07/12/2023. FINDINGS: Cardiovascular: Excellent contrast bolus timing in the pulmonary arterial tree. Chronic mildly increased central pulmonary artery size compatible with a degree of pulmonary artery hypertension. No pulmonary artery filling defect identified. Stable CABG. Calcified aortic atherosclerosis. Stable heart size. No pericardial effusion. Right chest Port-A-Cath partially visible as before. Mediastinum/Nodes: Mediastinum partially adherent to abnormal left lung and the left chest wall, unchanged. No distinct mediastinal lymphadenopathy. No infiltrative mediastinal soft tissue is evident. Lungs/Pleura: Abnormal left hemithorax with masslike architectural distortion and traction bronchiectasis, cavitation in the left upper lobe does not appear significantly changed since September. Enhancing probable round atelectasis in  the anterior basal segment of the left lower lobe appears stable. Partially layering, partially loculated and partially sub pulmonic left pleural effusion. Since September (series 6, image 128) and has complex fluid density. No obvious pleural thickening or enhancement with the early CTA contrast timing utilized today. Underlying emphysema. Central airways are patent, stable. Stable right lung since September. The sub pulmonic component appears larger Upper Abdomen: Negative visible liver, gallbladder, spleen, pancreas, adrenal glands, kidneys, and bowel in the upper abdomen. Musculoskeletal: Median sternotomy. Chronic left anterior 3rd and 4th rib fractures have a stable and benign appearance. No acute or suspicious osseous lesion is identified. Review of the MIP images confirms the above findings. IMPRESSION: 1. Negative for acute pulmonary embolus. 2. Emphysema (ICD10-J43.9). Chronic post treatment left lung appearance with architectural distortion and adhesion. Increased since September subpulmonic component of otherwise partially layering, partially loculated left pleural effusion. Complex fluid density. Otherwise stable lungs. 3. No mediastinal metastasis identified. Electronically Signed   By: Jill [name] M.D.   On: 10/02/2023 17:15    Impression/Plan: 1. 72 yo woman with brain metastasis secondary to metastatic, NSCLC, adenocarcinoma of the  left upper lung - Stage IVA. She has recovered well from the effects of her previous radiotherapy and remains without complaints. Her maintenance therapy with single agent Rande Lawman was discontinued in 09/2023 at her request due to significant fatigue and she has continued to be followed in observation. Her recent MRI brain scan from 10/17/2023 shows a stable appearance of the residual 5 mm focus of enhancement in the left frontal lobe (previously treated lesion) with improved surrounding edema and no visible sequela of previously treated lesions in the left temporal lobe  and left occipital lobe. There are no new lesions to suggest disease recurrence or progression.  She will continue management of her systemic disease under the care and direction of Dr. Shirline Frees and is scheduled for a follow up visit on 10/29/23.  Regarding the brain metastases, since her disease has remained stable for the past 2 years, we will move to 6 month surveillance scans going forward. We will continue to connect by telephone following each scan to review the results and recommendations from the multidisciplinary brain tumor board.  She appears to have a good understanding of these recommendations and is comfortable and in agreement with the stated plan.  She knows to call at anytime in the interim with any questions or concerns related to her previous radiation.   I personally spent 20 minutes in this encounter including chart review, reviewing radiological studies, telephone discussion with the patient, entering orders and completing documentation.     Marguarite Arbour, MMS, PA-C [facility] Radiation Oncology Physician Assistant Direct Dial: [phone]  Fax: [phone]

## 2023-10-24 NOTE — Progress Notes (Unsigned)
[facility] OFFICE PROGRESS NOTE  [name], MD [address] [place] [address]  DIAGNOSIS: Stage IV (T2 a, N2, M1b) non-small cell lung cancer, adenocarcinoma presented with left upper lobe lung mass in addition to left hilar and precarinal metastatic adenopathy and small left lower lobe pulmonary nodule in addition to solitary left frontal brain metastasis diagnosed in July 2022.   Molecular studies by [employer] showed  Positive KRAS G12C mutation PD-L1 expression was less than 1%  PRIOR THERAPY: 1) Status post SRS to the solitary brain metastasis. 2) Treatment for the locally advanced disease in the chest with carboplatin for AUC of 2 and paclitaxel 45 Mg/M2.  Status post 6 cycles.  Last dose was given 07/25/2021. 3) Systemic chemotherapy with carboplatin for AUC of 5, Alimta 500 Mg/M2 and Keytruda 200 Mg IV every 3 weeks.  First dose August 31, 2021.  Status post 33 cycles.  Starting from cycle #5 the patient will be on maintenance treatment with Alimta and Keytruda every 3 weeks.  Starting from cycle #30 she will be on single agent Keytruda 200 Mg IV every 3 weeks.  Alimta was discontinued secondary to intolerance.  CURRENT THERAPY: Observation   INTERVAL HISTORY: Jill [name] 72 y.o. female returns to the clinic today for follow-up visit.  The patient was last seen in the clinic by Dr. Arbutus Ped on 09/25/2023.  At that point in time, the patient was have having persistent fatigue and pain in the right hip. She also had a 6 week illness of vomiting and diarrhea.  She was also endorsing pain underneath her left breast.  She also was having significant weight loss. She also was having thyroid dysfunction.  The patient expressed desire to stop treatment.  Dr. Arbutus Ped recommended CT scan to restage her disease. She also had a brain MRI. The brain MRI showed stable 5 mm enhancing metastasis with improved surrounding edema.  She followed up radiation  oncology on 12/18 for continued surveillance.   She has thyroid dysfunction and her PCP is managing her synthroid.  She presented to the hospital for hemoptysis.  CT angiogram was negative for PE.  There is no evidence of pneumonia.  She has known left-sided pleural effusion.  Thoracentesis was offered.  She declined.  Since last being seen, she denies any fever, chills, or night sweats.  Weight loss?  She still has poor appetite. She reports early satiety and aversions to certain smells. She is eating small frequent meals. She gets some nausea from certain smells and will take her anti-emetic.  Denies any significant vomiting.  Denies any fever.  She sometimes has night sweats secondary to warm sleeping environment.  Her weight is stable.  She reports her breathing is stable.  She said that she intermittently may get a cough depending on her sinus drainage but that this is stable.  Denies any chest pain or hemoptysis.  Denies any diarrhea or constipation.  She reports some dry skin but denies any rashes.  She denies any headache or visual changes.  She recently had a restaging CT scan performed.  She is here today for evaluation and discuss her next steps.      MEDICAL HISTORY: Past Medical History:  Diagnosis Date   Anxiety    ON PAXIL, XANAX   AVM (arteriovenous malformation) brain    s/p stent/coil   Blood transfusion    x 2   Cancer (HCC)    Left lung   COPD (chronic  obstructive pulmonary disease) (HCC)    no inhaler, no oxygen   Coronary artery disease    Prior inferior MI with stent to RCA, s/p CABG in 2008   Depression    Dizziness    Dyspnea    with exertion, no oxygen   Dyspnea on exertion 10/02/2023   Fatigue    Foot injury 06/01/2017   right - RESOLVED, no longer an issue per patient 05/18/21   GERD (gastroesophageal reflux disease)    Headache(784.0)    UNRUPTURED CEREBRAL ANEURYSM   Hyperlipidemia    Hypertension    Hypothyroidism    Infected cyst of skin  09/18/2013   Left knee pain 06/05/2012   Lung mass    Left lung   Myocardial infarction [facility])    Normal nuclear stress test Ju;y 2012   No ischemia. EF 70%; fixed defect involving septum, inferoseptal and inferior wall   Pain, dental 02/06/2017   Poor dental hygiene    Retroperitoneal bleeding    Following cardiac cath   Tobacco abuse    Urine discoloration 09/18/2013   Urine incontinence 07/06/2020   UTI (lower urinary tract infection) 10/16/2013    ALLERGIES:  is allergic to varenicline, ace inhibitors, prednisone, betalin 12 [vitamin b12], penicillins, and sulfa drugs cross reactors.  MEDICATIONS:  Current Outpatient Medications  Medication Sig Dispense Refill   acetaminophen (TYLENOL) 500 MG tablet Take 500-1,000 mg by mouth every 6 (six) hours as needed for moderate pain (pain score 4-6). (Patient not taking: Reported on 10/09/2023)     albuterol (PROVENTIL) (2.5 MG/3ML) 0.083% nebulizer solution Take 3 mLs (2.5 mg total) by nebulization every 6 (six) hours as needed for wheezing or shortness of breath. (Patient not taking: Reported on 10/09/2023) 75 mL 12   albuterol (VENTOLIN HFA) 108 (90 Base) MCG/ACT inhaler Inhale 2 puffs into the lungs every 6 (six) hours as needed for wheezing or shortness of breath. (Patient not taking: Reported on 10/09/2023) 8 g 2   amLODipine (NORVASC) 10 MG tablet TAKE 1 TABLET BY MOUTH ONCE DAILY . APPOINTMENT REQUIRED FOR FUTURE REFILLS 90 tablet 1   aspirin EC 81 MG tablet Take 1 tablet (81 mg total) by mouth daily. 90 tablet 2   atorvastatin (LIPITOR) 40 MG tablet TAKE 1 TABLET BY MOUTH EVERY DAY 90 tablet 1   Cholecalciferol (VITAMIN D3 PO) Take 1 tablet by mouth daily. (Patient not taking: Reported on 10/09/2023)     clonazePAM (KLONOPIN) 0.5 MG tablet Take 1 tablet (0.5 mg total) by mouth 2 (two) times daily as needed for anxiety. 50 tablet 1   escitalopram (LEXAPRO) 10 MG tablet TAKE 1 TABLET BY MOUTH EVERY DAY 90 tablet 1   ferrous sulfate 324 MG  TBEC Take 1 tablet (324 mg total) by mouth daily with breakfast. 90 tablet 1   folic acid (FOLVITE) 1 MG tablet TAKE 1 TABLET BY MOUTH EVERY DAY 90 tablet 0   lactose free nutrition (BOOST PLUS) LIQD Take 237 mLs by mouth 2 (two) times daily between meals. 42660 mL 2   levothyroxine (SYNTHROID) 150 MCG tablet Take 1 tablet (150 mcg total) by mouth daily before breakfast. 30 tablet 1   lidocaine-prilocaine (EMLA) cream Apply 1 Application topically as needed. (Patient not taking: Reported on 10/09/2023) 30 g 2   losartan (COZAAR) 100 MG tablet TAKE 1 TABLET BY MOUTH EVERY DAY 90 tablet 1   metoprolol succinate (TOPROL-XL) 50 MG 24 hr tablet TAKE 1 TABLET BY MOUTH ONCE DAILY WITH MEALS OR  IMMEDIATLEY AFTER A MEAL 90 tablet 1   Multiple Vitamins-Minerals (MULTIVITAMIN WITH MINERALS) tablet Take 1 tablet by mouth daily. (Patient not taking: Reported on 10/09/2023)     nitroGLYCERIN (NITROSTAT) 0.4 MG SL tablet Place 1 tablet (0.4 mg total) under the tongue every 5 (five) minutes as needed. For chest pain (Patient not taking: Reported on 10/09/2023) 25 tablet 6   prochlorperazine (COMPAZINE) 10 MG tablet TAKE 1 TABLET BY MOUTH EVERY 6 HOURS AS NEEDED NAUSEA AND VOMITING (Patient not taking: Reported on 10/09/2023) 30 tablet 0   senna (SENOKOT) 8.6 MG TABS tablet Take 1 tablet (8.6 mg total) by mouth 2 (two) times daily as needed for mild constipation. 60 tablet 0   umeclidinium-vilanterol (ANORO ELLIPTA) 62.5-25 MCG/ACT AEPB Inhale 1 puff into the lungs daily. 60 each 1   VITAMIN A PO Take 1 tablet by mouth daily. (Patient not taking: Reported on 10/09/2023)     No current facility-administered medications for this visit.    SURGICAL HISTORY:  Past Surgical History:  Procedure Laterality Date   CARDIAC CATHETERIZATION  01/02/2007   IT REVEALS MILD INFERIOR WALL HYPOKINESIS. THE EJECTION FRACTION IS AROUND 50%   COLONOSCOPY     CORONARY ARTERY BYPASS GRAFT  11/06/2006   LIMA to LAD, SVG to DX, SVG to  LCX & SVG to OM 1 & 2, and SVG to PD   CORONARY STENT PLACEMENT     Remote past stent to RCA   EYE SURGERY Right    cataracts removed   HEMORRHOID SURGERY  11/07/1987   IR IMAGING GUIDED PORT INSERTION  10/04/2022   UPPER GI ENDOSCOPY     VENTRICULOSTOMY  10/06/2011   Procedure: VENTRICULOSTOMY;  Surgeon: Carmela Hurt;  Location: [facility];  Service: Neurosurgery;  Laterality: Right;  Insertion of Ventriculostomy Catheter   VIDEO BRONCHOSCOPY WITH ENDOBRONCHIAL NAVIGATION Left 05/20/2021   Procedure: VIDEO BRONCHOSCOPY WITH ENDOBRONCHIAL NAVIGATION;  Surgeon: Josephine Igo, DO;  Location: [facility] OR;  Service: Pulmonary;  Laterality: Left;   VIDEO BRONCHOSCOPY WITH ENDOBRONCHIAL ULTRASOUND Bilateral 05/20/2021   Procedure: VIDEO BRONCHOSCOPY WITH ENDOBRONCHIAL ULTRASOUND;  Surgeon: Josephine Igo, DO;  Location: [facility] OR;  Service: Pulmonary;  Laterality: Bilateral;   WISDOM TOOTH EXTRACTION      REVIEW OF SYSTEMS:   Review of Systems  Constitutional: Negative for appetite change, chills, fatigue, fever and unexpected weight change.  HENT:   Negative for mouth sores, nosebleeds, sore throat and trouble swallowing.   Eyes: Negative for eye problems and icterus.  Respiratory: Negative for cough, hemoptysis, shortness of breath and wheezing.   Cardiovascular: Negative for chest pain and leg swelling.  Gastrointestinal: Negative for abdominal pain, constipation, diarrhea, nausea and vomiting.  Genitourinary: Negative for bladder incontinence, difficulty urinating, dysuria, frequency and hematuria.   Musculoskeletal: Negative for back pain, gait problem, neck pain and neck stiffness.  Skin: Negative for itching and rash.  Neurological: Negative for dizziness, extremity weakness, gait problem, headaches, light-headedness and seizures.  Hematological: Negative for adenopathy. Does not bruise/bleed easily.  Psychiatric/Behavioral: Negative for confusion, depression and sleep disturbance. The  patient is not nervous/anxious.     PHYSICAL EXAMINATION:  There were no vitals taken for this visit.  ECOG PERFORMANCE STATUS: {CHL ONC ECOG [id number]  Physical Exam  Constitutional: Oriented to person, place, and time and well-developed, well-nourished, and in no distress. No distress.  HENT:  Head: Normocephalic and atraumatic.  Mouth/Throat: Oropharynx is clear and moist. No oropharyngeal exudate.  Eyes: Conjunctivae are normal. Right  eye exhibits no discharge. Left eye exhibits no discharge. No scleral icterus.  Neck: Normal range of motion. Neck supple.  Cardiovascular: Normal rate, regular rhythm, normal heart sounds and intact distal pulses.   Pulmonary/Chest: Effort normal and breath sounds normal. No respiratory distress. No wheezes. No rales.  Abdominal: Soft. Bowel sounds are normal. Exhibits no distension and no mass. There is no tenderness.  Musculoskeletal: Normal range of motion. Exhibits no edema.  Lymphadenopathy:    No cervical adenopathy.  Neurological: Alert and oriented to person, place, and time. Exhibits normal muscle tone. Gait normal. Coordination normal.  Skin: Skin is warm and dry. No rash noted. Not diaphoretic. No erythema. No pallor.  Psychiatric: Mood, memory and judgment normal.  Vitals reviewed.  LABORATORY DATA: Lab Results  Component Value Date   WBC 10.3 10/09/2023   HGB 9.6 (L) 10/09/2023   HCT 30.2 (L) 10/09/2023   MCV 91 10/09/2023   PLT 457 (H) 10/09/2023      Chemistry      Component Value Date/Time   NA 138 10/03/2023 0303   NA 141 04/12/2021 1356   K 4.4 10/03/2023 0303   CL 102 10/03/2023 0303   CO2 29 10/03/2023 0303   BUN 11 10/03/2023 0303   BUN 13 04/12/2021 1356   CREATININE 0.71 10/03/2023 0303   CREATININE 0.75 09/25/2023 1452   CREATININE 0.99 08/29/2016 1130      Component Value Date/Time   CALCIUM 9.0 10/03/2023 0303   ALKPHOS 75 09/25/2023 1452   AST 14 (L) 09/25/2023 1452   ALT 9 09/25/2023 1452    BILITOT 0.3 09/25/2023 1452       RADIOGRAPHIC STUDIES:  MR Brain W Wo Contrast Result Date: 10/18/2023 CLINICAL DATA:  Brain metastases, assess treatment response 3T SRS Protocol. EXAM: MRI HEAD WITHOUT AND WITH CONTRAST TECHNIQUE: Multiplanar, multiecho pulse sequences of the brain and surrounding structures were obtained without and with intravenous contrast. CONTRAST:  5mL GADAVIST GADOBUTROL 1 MMOL/ML IV SOLN COMPARISON:  MRI 05/15/2023. FINDINGS: Brain: Stable 5 mm enhancing metastasis in the left frontal lobe. Improved surrounding T2/FLAIR hyperintensity. No new enhancing lesions identified. Additional scattered T2/FLAIR hyperintensities in the white matter are not changed and compatible with chronic microvascular ischemic disease. Similar developmental venous anomaly in the right frontal lobe, benign. No evidence of acute infarct, acute hemorrhage, midline shift or hydrocephalus. Vascular: Major arterial flow voids are maintained at the skull base. 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 Skull and upper cervical spine: Normal marrow signal. Sinuses/Orbits: Mild paranasal sinus mucosal thickening. IMPRESSION: Stable 5 mm enhancing metastasis in the left frontal lobe. Improved surrounding edema. No new enhancing lesions identified. Electronically Signed   By: Feliberto Harts M.D.   On: 10/18/2023 12:23   CT Angio Chest Pulmonary Embolism (PE) W or WO Contrast Result Date: 10/02/2023 CLINICAL DATA:  72 year old female with increasing cough, shortness of breath, chest tightness. Chest pain. Lung cancer. EXAM: CT ANGIOGRAPHY CHEST WITH CONTRAST TECHNIQUE: Multidetector CT imaging of the chest was performed using the standard protocol during bolus administration of intravenous contrast. Multiplanar CT image reconstructions and MIPs were obtained to evaluate the vascular anatomy. RADIATION DOSE REDUCTION: This exam was performed according to the  departmental dose-optimization program which includes automated exposure control, adjustment of the mA and/or kV according to patient size and/or use of iterative reconstruction technique. CONTRAST:  75mL OMNIPAQUE IOHEXOL 350 MG/ML SOLN COMPARISON:  Restaging chest CT 07/12/2023. FINDINGS: Cardiovascular: Excellent contrast bolus  timing in the pulmonary arterial tree. Chronic mildly increased central pulmonary artery size compatible with a degree of pulmonary artery hypertension. No pulmonary artery filling defect identified. Stable CABG. Calcified aortic atherosclerosis. Stable heart size. No pericardial effusion. Right chest Port-A-Cath partially visible as before. Mediastinum/Nodes: Mediastinum partially adherent to abnormal left lung and the left chest wall, unchanged. No distinct mediastinal lymphadenopathy. No infiltrative mediastinal soft tissue is evident. Lungs/Pleura: Abnormal left hemithorax with masslike architectural distortion and traction bronchiectasis, cavitation in the left upper lobe does not appear significantly changed since September. Enhancing probable round atelectasis in the anterior basal segment of the left lower lobe appears stable. Partially layering, partially loculated and partially sub pulmonic left pleural effusion. Since September (series 6, image 128) and has complex fluid density. No obvious pleural thickening or enhancement with the early CTA contrast timing utilized today. Underlying emphysema. Central airways are patent, stable. Stable right lung since September. The sub pulmonic component appears larger Upper Abdomen: Negative visible liver, gallbladder, spleen, pancreas, adrenal glands, kidneys, and bowel in the upper abdomen. Musculoskeletal: Median sternotomy. Chronic left anterior 3rd and 4th rib fractures have a stable and benign appearance. No acute or suspicious osseous lesion is identified. Review of the MIP images confirms the above findings. IMPRESSION: 1. Negative  for acute pulmonary embolus. 2. Emphysema (ICD10-J43.9). Chronic post treatment left lung appearance with architectural distortion and adhesion. Increased since September subpulmonic component of otherwise partially layering, partially loculated left pleural effusion. Complex fluid density. Otherwise stable lungs. 3. No mediastinal metastasis identified. Electronically Signed   By: Jill [name] M.D.   On: 10/02/2023 17:15     ASSESSMENT/PLAN:  his is a very pleasant 72 year old Caucasian female diagnosed with stage IV (T2 a, N2, M1 B) non-small cell lung cancer, adenocarcinoma.  She presented with a left upper lobe lung mass in addition to left hilar and precarinal metastatic adenopathy as well as a small left lower lobe pulmonary nodule and a solitary left frontal brain metastasis.  This was diagnosed in July 2022.  Her molecular studies show that she is positive for K-ras G12C mutation and her PD-L1 expression is negative.    She completed SRS to the solitary brain metastasis under the care of Dr. Kathrynn Running which was completed on 06/23/2021.  She had a repeat brain MRI performed on 09/15/2021 which shows a new 1 mm and 3 mm metastatic brain lesions.  She received SRS on 09/28/2021.    She completed concurrent chemoradiation for the locally advanced disease in the chest with carboplatin for an AUC of 2 and paclitaxel 45 mg per metered squared.  She is status post 6 cycles.  She was undergoing treatment with systemic palliative chemotherapy with carboplatin for an AUC of 5, Alimta 500 mg/m, Keytruda 200 mg IV every 3 weeks.  She is status post 33 cycles.  It was discontinued secondary to fatigue, weakness, intolerance.  She was then on single agent immunotherapy with Keytruda.  But she asked to discontinue her treatment due to fatigue and weakness.  She recently had a restaging CT scan.  The patient was seen with Dr. Arbutus Ped today.  Dr. Arbutus Ped personally and independently reviewed her scan and discussed  results with the patient today.  The scan showed ***  Recommend ***  I will arrange for ***  F/u***  Port****  Thyroid***  The patient was advised to call immediately if she has any concerning symptoms in the interval. The patient voices understanding of current disease status and treatment options and  is in agreement with the current care plan. All questions were answered. The patient knows to call the clinic with any problems, questions or concerns. We can certainly see the patient much sooner if necessary     No orders of the defined types were placed in this encounter.    I spent {CHL ONC TIME VISIT - ZOXWR:[id number]} counseling the patient face to face. The total time spent in the appointment was {CHL ONC TIME VISIT - BJYNW:[id number]}.  Lucca Greggs L Desman Polak, PA-C 10/24/23

## 2023-10-25 ENCOUNTER — Other Ambulatory Visit: Payer: Self-pay | Admitting: Internal Medicine

## 2023-10-25 ENCOUNTER — Inpatient Hospital Stay: Payer: Medicare HMO | Admitting: Physician Assistant

## 2023-10-25 ENCOUNTER — Ambulatory Visit (HOSPITAL_COMMUNITY)
Admission: RE | Admit: 2023-10-25 | Discharge: 2023-10-25 | Disposition: A | Discharge status: Home / Self Care | Payer: Medicare HMO | Source: Ambulatory Visit | Attending: Internal Medicine | Admitting: Internal Medicine

## 2023-10-25 DIAGNOSIS — C3490 Malignant neoplasm of unspecified part of unspecified bronchus or lung: Secondary | ICD-10-CM | POA: Diagnosis not present

## 2023-10-25 DIAGNOSIS — I700 Atherosclerosis of aorta: Secondary | ICD-10-CM | POA: Diagnosis not present

## 2023-10-25 DIAGNOSIS — J90 Pleural effusion, not elsewhere classified: Secondary | ICD-10-CM | POA: Diagnosis not present

## 2023-10-25 DIAGNOSIS — J439 Emphysema, unspecified: Secondary | ICD-10-CM | POA: Diagnosis not present

## 2023-10-25 MED ORDER — IOHEXOL 300 MG/ML  SOLN
100.0000 mL | Freq: Once | INTRAMUSCULAR | Status: AC | PRN
  Administered 2023-10-25: 100 mL via INTRAVENOUS

## 2023-10-27 ENCOUNTER — Other Ambulatory Visit: Payer: Self-pay | Admitting: Family Medicine

## 2023-10-29 ENCOUNTER — Inpatient Hospital Stay: Payer: Medicare HMO | Attending: Internal Medicine | Admitting: Physician Assistant

## 2023-10-29 VITALS — BP 110/60 | HR 81 | Temp 98.3°F | Resp 18 | Wt 122.4 lb

## 2023-10-29 DIAGNOSIS — Z87440 Personal history of urinary (tract) infections: Secondary | ICD-10-CM | POA: Insufficient documentation

## 2023-10-29 DIAGNOSIS — F419 Anxiety disorder, unspecified: Secondary | ICD-10-CM | POA: Diagnosis not present

## 2023-10-29 DIAGNOSIS — J90 Pleural effusion, not elsewhere classified: Secondary | ICD-10-CM | POA: Insufficient documentation

## 2023-10-29 DIAGNOSIS — C3412 Malignant neoplasm of upper lobe, left bronchus or lung: Secondary | ICD-10-CM | POA: Insufficient documentation

## 2023-10-29 DIAGNOSIS — E039 Hypothyroidism, unspecified: Secondary | ICD-10-CM | POA: Diagnosis not present

## 2023-10-29 DIAGNOSIS — I2510 Atherosclerotic heart disease of native coronary artery without angina pectoris: Secondary | ICD-10-CM | POA: Insufficient documentation

## 2023-10-29 DIAGNOSIS — I10 Essential (primary) hypertension: Secondary | ICD-10-CM | POA: Diagnosis not present

## 2023-10-29 DIAGNOSIS — I2699 Other pulmonary embolism without acute cor pulmonale: Secondary | ICD-10-CM | POA: Diagnosis not present

## 2023-10-29 DIAGNOSIS — R609 Edema, unspecified: Secondary | ICD-10-CM | POA: Insufficient documentation

## 2023-10-29 DIAGNOSIS — Z880 Allergy status to penicillin: Secondary | ICD-10-CM | POA: Insufficient documentation

## 2023-10-29 DIAGNOSIS — R6881 Early satiety: Secondary | ICD-10-CM | POA: Diagnosis not present

## 2023-10-29 DIAGNOSIS — D649 Anemia, unspecified: Secondary | ICD-10-CM | POA: Insufficient documentation

## 2023-10-29 DIAGNOSIS — I700 Atherosclerosis of aorta: Secondary | ICD-10-CM | POA: Insufficient documentation

## 2023-10-29 DIAGNOSIS — Z888 Allergy status to other drugs, medicaments and biological substances status: Secondary | ICD-10-CM | POA: Insufficient documentation

## 2023-10-29 DIAGNOSIS — R519 Headache, unspecified: Secondary | ICD-10-CM | POA: Diagnosis not present

## 2023-10-29 DIAGNOSIS — Z79899 Other long term (current) drug therapy: Secondary | ICD-10-CM | POA: Insufficient documentation

## 2023-10-29 DIAGNOSIS — M25551 Pain in right hip: Secondary | ICD-10-CM | POA: Diagnosis not present

## 2023-10-29 DIAGNOSIS — R042 Hemoptysis: Secondary | ICD-10-CM | POA: Diagnosis not present

## 2023-10-29 DIAGNOSIS — C3490 Malignant neoplasm of unspecified part of unspecified bronchus or lung: Secondary | ICD-10-CM | POA: Diagnosis not present

## 2023-10-29 DIAGNOSIS — Z923 Personal history of irradiation: Secondary | ICD-10-CM | POA: Insufficient documentation

## 2023-10-29 DIAGNOSIS — R630 Anorexia: Secondary | ICD-10-CM | POA: Insufficient documentation

## 2023-10-29 DIAGNOSIS — Z882 Allergy status to sulfonamides status: Secondary | ICD-10-CM | POA: Insufficient documentation

## 2023-10-29 DIAGNOSIS — E611 Iron deficiency: Secondary | ICD-10-CM | POA: Insufficient documentation

## 2023-10-29 DIAGNOSIS — C779 Secondary and unspecified malignant neoplasm of lymph node, unspecified: Secondary | ICD-10-CM | POA: Diagnosis not present

## 2023-10-29 DIAGNOSIS — E785 Hyperlipidemia, unspecified: Secondary | ICD-10-CM | POA: Diagnosis not present

## 2023-10-29 DIAGNOSIS — J439 Emphysema, unspecified: Secondary | ICD-10-CM | POA: Insufficient documentation

## 2023-10-29 DIAGNOSIS — C7931 Secondary malignant neoplasm of brain: Secondary | ICD-10-CM | POA: Diagnosis not present

## 2023-10-29 DIAGNOSIS — Z9221 Personal history of antineoplastic chemotherapy: Secondary | ICD-10-CM | POA: Insufficient documentation

## 2023-10-29 DIAGNOSIS — J479 Bronchiectasis, uncomplicated: Secondary | ICD-10-CM | POA: Diagnosis not present

## 2023-10-29 DIAGNOSIS — Z79890 Hormone replacement therapy (postmenopausal): Secondary | ICD-10-CM | POA: Insufficient documentation

## 2023-10-29 DIAGNOSIS — I252 Old myocardial infarction: Secondary | ICD-10-CM | POA: Diagnosis not present

## 2023-10-29 DIAGNOSIS — F17200 Nicotine dependence, unspecified, uncomplicated: Secondary | ICD-10-CM | POA: Insufficient documentation

## 2023-10-29 DIAGNOSIS — Z8719 Personal history of other diseases of the digestive system: Secondary | ICD-10-CM | POA: Insufficient documentation

## 2023-10-29 MED ORDER — PROCHLORPERAZINE MALEATE 10 MG PO TABS
10.0000 mg | ORAL_TABLET | Freq: Four times a day (QID) | ORAL | 0 refills | Status: DC | PRN
  Filled 2023-10-29: qty 30, 8d supply, fill #0

## 2023-10-29 MED ORDER — DRONABINOL 2.5 MG PO CAPS
2.5000 mg | ORAL_CAPSULE | Freq: Two times a day (BID) | ORAL | 0 refills | Status: DC
  Filled 2023-10-29: qty 60, 30d supply, fill #0

## 2023-10-30 ENCOUNTER — Other Ambulatory Visit (HOSPITAL_COMMUNITY): Payer: Self-pay

## 2023-10-30 ENCOUNTER — Telehealth: Payer: Self-pay

## 2023-10-30 ENCOUNTER — Encounter: Payer: Self-pay | Admitting: Internal Medicine

## 2023-10-30 ENCOUNTER — Other Ambulatory Visit: Payer: Self-pay

## 2023-10-30 NOTE — Telephone Encounter (Signed)
Notified Patient of prior authorization approval for Dronabinol 2.5 mg Capsules. Medication is approved through 04/27/2024. Pharmacy notified. No other needs or concerns voiced at this time.

## 2023-11-01 ENCOUNTER — Other Ambulatory Visit (HOSPITAL_COMMUNITY): Payer: Self-pay

## 2023-11-08 ENCOUNTER — Other Ambulatory Visit: Payer: Self-pay | Admitting: Radiation Therapy

## 2023-11-08 DIAGNOSIS — C7931 Secondary malignant neoplasm of brain: Secondary | ICD-10-CM

## 2023-11-14 ENCOUNTER — Telehealth: Payer: Self-pay

## 2023-11-14 NOTE — Telephone Encounter (Signed)
 Tried to reach patient in regards to new insurance information for 2025.  LVM for return call and asked to send a picture of new insurance through [place] if possible. Sending mychart message as well.

## 2023-11-15 ENCOUNTER — Encounter: Payer: Self-pay | Admitting: Internal Medicine

## 2023-11-16 ENCOUNTER — Encounter: Payer: Self-pay | Admitting: Internal Medicine

## 2023-11-19 ENCOUNTER — Other Ambulatory Visit: Payer: Self-pay | Admitting: Physician Assistant

## 2023-11-19 ENCOUNTER — Telehealth: Payer: Self-pay

## 2023-11-19 DIAGNOSIS — D509 Iron deficiency anemia, unspecified: Secondary | ICD-10-CM | POA: Insufficient documentation

## 2023-11-19 NOTE — Telephone Encounter (Signed)
 Jill [name], patient will be scheduled as soon as possible.  Auth Submission: NO AUTH NEEDED Site of care: Site of care: [facility] Payer: [employer] Medicare Medication & CPT/J Code(s) submitted: Feraheme (ferumoxytol ) [id number] Route of submission (phone, fax, portal): portal Phone # Fax # Auth type: Buy/Bill PB Units/visits requested: 510mg  2 doses Reference number: [id number] Approval from: 11/19/23 to 05/05/24

## 2023-11-22 ENCOUNTER — Other Ambulatory Visit: Payer: Medicare Other

## 2023-11-22 DIAGNOSIS — E039 Hypothyroidism, unspecified: Secondary | ICD-10-CM | POA: Diagnosis not present

## 2023-11-23 ENCOUNTER — Telehealth: Payer: Self-pay | Admitting: Family Medicine

## 2023-11-23 DIAGNOSIS — E039 Hypothyroidism, unspecified: Secondary | ICD-10-CM

## 2023-11-23 LAB — TSH: TSH: 0.052 u[IU]/mL — ABNORMAL LOW (ref 0.450–4.500)

## 2023-11-23 MED ORDER — LEVOTHYROXINE SODIUM 137 MCG PO TABS: 137.0000 ug | ORAL_TABLET | Freq: Every day | ORAL | 1 refills | Status: DC

## 2023-11-23 NOTE — Telephone Encounter (Signed)
TSH even lower on Synthroid 150 mcg every day. I advised her to d/c Synthroid 150 mcg every day and start 137 mcg every day. Return in 6 weeks for lab check. She agreed with the plan.

## 2023-11-29 ENCOUNTER — Ambulatory Visit: Payer: Medicare Other

## 2023-11-30 ENCOUNTER — Telehealth: Payer: Self-pay

## 2023-11-30 NOTE — Telephone Encounter (Signed)
Spoke with patient. Patient stated that she is unable to get transportation to office. Due to daughter hurting her back. I told her I would see if the provider would be willing to do a telephone visit.  I will call her back once I am notified by provider. Aquilla Solian, CMA

## 2023-11-30 NOTE — Telephone Encounter (Signed)
Virtual visit is fine

## 2023-11-30 NOTE — Telephone Encounter (Signed)
Patient LVM on nurse line requesting prescription for Zpak, as she believes she has bronchitis.   Called patient back. Advised that I would need to gather more information to send back to Dr. Lum Babe. Patient states "this is bronchitis, not possible bronchitis."   Reports productive cough for the last two day. Denies hemoptysis, fever or chills. Recommended appointment to further evaluate symptoms. Patient asks that I send message to Dr. Lum Babe, as she has been treated previously without an appointment.   Forwarding to Dr. Lum Babe for next steps.   Veronda Prude, RN

## 2023-12-01 MED ORDER — AZITHROMYCIN 250 MG PO TABS: ORAL_TABLET | ORAL | 0 refills | Status: DC

## 2023-12-01 NOTE — Telephone Encounter (Signed)
I called to check on this patient since it is the weekend. She sounded congested. C/O cough and sore throat x few days. Exposure to two people with bronchitis. I advised her she could be having viral illness including COVID-19 and recommended getting tested. She preferred trial of A/B since this worked in the past. Will give delayed A/B prescription. May use if no improvement in symptoms. Risk of antibiotic resistance discussed. ED precautions discussed. She agreed with the plan.

## 2023-12-04 ENCOUNTER — Other Ambulatory Visit: Payer: Self-pay

## 2023-12-04 ENCOUNTER — Telehealth: Payer: Self-pay | Admitting: *Deleted

## 2023-12-04 MED ORDER — CLONAZEPAM 0.5 MG PO TABS: 0.5000 mg | ORAL_TABLET | Freq: Two times a day (BID) | ORAL | 1 refills | Status: DC | PRN

## 2023-12-04 NOTE — Telephone Encounter (Signed)
Patient called to confirm her next appointments. Discussed her iron infusion on Thursday. Patient asked to have this rescheduled, she is not feeling well. This RN advised pt message would be sent to the infusion center to have this rescheduled and they would contact her.   Further appointments were discussed on 2/3. Patient is questioning genetic counseling  appointment prior to the flush appointment, stating she might not want to go to this and have this done. Encouraged patient to have this completed, and explained the significance of the genetic testing. Patient agreed to keep appointment stating that she would call back later if she changed her mind.

## 2023-12-06 ENCOUNTER — Ambulatory Visit: Payer: Medicare Other

## 2023-12-10 ENCOUNTER — Inpatient Hospital Stay: Payer: Medicare Other | Attending: Internal Medicine | Admitting: Genetic Counselor

## 2023-12-10 ENCOUNTER — Other Ambulatory Visit: Payer: Medicare HMO

## 2023-12-10 ENCOUNTER — Inpatient Hospital Stay: Payer: Medicare Other

## 2023-12-10 ENCOUNTER — Encounter: Payer: Self-pay | Admitting: Genetic Counselor

## 2023-12-10 ENCOUNTER — Other Ambulatory Visit: Payer: Self-pay | Admitting: *Deleted

## 2023-12-10 ENCOUNTER — Telehealth: Payer: Self-pay | Admitting: Radiation Therapy

## 2023-12-10 DIAGNOSIS — E785 Hyperlipidemia, unspecified: Secondary | ICD-10-CM | POA: Diagnosis not present

## 2023-12-10 DIAGNOSIS — C3490 Malignant neoplasm of unspecified part of unspecified bronchus or lung: Secondary | ICD-10-CM

## 2023-12-10 DIAGNOSIS — Z8041 Family history of malignant neoplasm of ovary: Secondary | ICD-10-CM | POA: Diagnosis not present

## 2023-12-10 DIAGNOSIS — Z95828 Presence of other vascular implants and grafts: Secondary | ICD-10-CM

## 2023-12-10 DIAGNOSIS — D509 Iron deficiency anemia, unspecified: Secondary | ICD-10-CM

## 2023-12-10 DIAGNOSIS — Z79899 Other long term (current) drug therapy: Secondary | ICD-10-CM | POA: Insufficient documentation

## 2023-12-10 DIAGNOSIS — Z801 Family history of malignant neoplasm of trachea, bronchus and lung: Secondary | ICD-10-CM | POA: Insufficient documentation

## 2023-12-10 DIAGNOSIS — D539 Nutritional anemia, unspecified: Secondary | ICD-10-CM

## 2023-12-10 DIAGNOSIS — I10 Essential (primary) hypertension: Secondary | ICD-10-CM | POA: Insufficient documentation

## 2023-12-10 DIAGNOSIS — Z8042 Family history of malignant neoplasm of prostate: Secondary | ICD-10-CM | POA: Diagnosis not present

## 2023-12-10 DIAGNOSIS — C3412 Malignant neoplasm of upper lobe, left bronchus or lung: Secondary | ICD-10-CM | POA: Diagnosis not present

## 2023-12-10 DIAGNOSIS — E039 Hypothyroidism, unspecified: Secondary | ICD-10-CM | POA: Insufficient documentation

## 2023-12-10 DIAGNOSIS — Z808 Family history of malignant neoplasm of other organs or systems: Secondary | ICD-10-CM | POA: Insufficient documentation

## 2023-12-10 DIAGNOSIS — C7931 Secondary malignant neoplasm of brain: Secondary | ICD-10-CM

## 2023-12-10 DIAGNOSIS — Z803 Family history of malignant neoplasm of breast: Secondary | ICD-10-CM | POA: Diagnosis not present

## 2023-12-10 DIAGNOSIS — R0600 Dyspnea, unspecified: Secondary | ICD-10-CM | POA: Insufficient documentation

## 2023-12-10 LAB — CMP (CANCER CENTER ONLY)
ALT: 14 U/L (ref 0–44)
AST: 19 U/L (ref 15–41)
Albumin: 3.1 g/dL — ABNORMAL LOW (ref 3.5–5.0)
Alkaline Phosphatase: 74 U/L (ref 38–126)
Anion gap: 4 — ABNORMAL LOW (ref 5–15)
BUN: 10 mg/dL (ref 8–23)
CO2: 31 mmol/L (ref 22–32)
Calcium: 9.5 mg/dL (ref 8.9–10.3)
Chloride: 100 mmol/L (ref 98–111)
Creatinine: 0.74 mg/dL (ref 0.44–1.00)
GFR, Estimated: >60 mL/min (ref >60)
Glucose, Bld: 92 mg/dL (ref 70–99)
Potassium: 4.3 mmol/L (ref 3.5–5.1)
Sodium: 135 mmol/L (ref 135–145)
Total Bilirubin: 0.3 mg/dL (ref 0.0–1.2)
Total Protein: 6.8 g/dL (ref 6.5–8.1)

## 2023-12-10 LAB — CBC WITH DIFFERENTIAL (CANCER CENTER ONLY)
Abs Immature Granulocytes: 0.03 10*3/uL (ref 0.00–0.07)
Basophils Absolute: 0.1 10*3/uL (ref 0.0–0.1)
Basophils Relative: 1 %
Eosinophils Absolute: 0.1 10*3/uL (ref 0.0–0.5)
Eosinophils Relative: 1 %
HCT: 32.8 % — ABNORMAL LOW (ref 36.0–46.0)
Hemoglobin: 10.1 g/dL — ABNORMAL LOW (ref 12.0–15.0)
Immature Granulocytes: 0 %
Lymphocytes Relative: 11 %
Lymphs Abs: 1 10*3/uL (ref 0.7–4.0)
MCH: 28.2 pg (ref 26.0–34.0)
MCHC: 30.8 g/dL (ref 30.0–36.0)
MCV: 91.6 fL (ref 80.0–100.0)
Monocytes Absolute: 0.9 10*3/uL (ref 0.1–1.0)
Monocytes Relative: 9 %
Neutro Abs: 7.4 10*3/uL (ref 1.7–7.7)
Neutrophils Relative %: 78 %
Platelet Count: 471 10*3/uL — ABNORMAL HIGH (ref 150–400)
RBC: 3.58 MIL/uL — ABNORMAL LOW (ref 3.87–5.11)
RDW: 17.1 % — ABNORMAL HIGH (ref 11.5–15.5)
WBC Count: 9.5 10*3/uL (ref 4.0–10.5)
nRBC: 0 % (ref 0.0–0.2)

## 2023-12-10 LAB — GENETIC SCREENING ORDER

## 2023-12-10 MED ORDER — SODIUM CHLORIDE 0.9% FLUSH
10.0000 mL | Freq: Once | INTRAVENOUS | Status: AC
  Administered 2023-12-10: 10 mL

## 2023-12-10 MED ORDER — HEPARIN SOD (PORK) LOCK FLUSH 100 UNIT/ML IV SOLN
500.0000 [IU] | Freq: Once | INTRAVENOUS | Status: AC
  Administered 2023-12-10: 500 [IU]

## 2023-12-10 NOTE — Progress Notes (Signed)
REFERRING PROVIDER: Marcello Fennel, PA-C [address] [place],  [place] [address]  PRIMARY PROVIDER:  [name], MD  PRIMARY REASON FOR VISIT:  Encounter Diagnoses  Name Primary?   Primary malignant neoplasm of lung with metastasis to brain [facility]) Yes   Primary adenocarcinoma of left upper lobe of lung (HCC)    Family history of breast cancer    Family history of ovarian cancer    Family history of prostate cancer    Family history of lung cancer      HISTORY OF PRESENT ILLNESS:   Jill [name], a 73 y.o. female, was seen for a [facility] cancer genetics consultation at the request of Ashlyn Bruning, PA-C due to a personal history of lung cancer and family history of other cancers.  Jill [name] presents to clinic today to discuss the possibility of a hereditary predisposition to cancer, to discuss genetic testing, and to further clarify her future cancer risks, as well as potential cancer risks for family members.   Jill [name] has a history of Stage IV non-small cell lung cancer, adenocarcinoma with brain metastasis, diagnosed in July 2022.   CANCER HISTORY:  Oncology History  Primary adenocarcinoma of left upper lobe of lung (HCC)  05/16/2021 Initial Diagnosis   Primary adenocarcinoma of left upper lobe of lung (HCC)   06/20/2021 - 07/25/2021 Chemotherapy   Patient is on Treatment Plan : LUNG Carboplatin / Paclitaxel + XRT q7d     08/31/2021 - 08/07/2023 Chemotherapy   Patient is on Treatment Plan : LUNG Carboplatin (5) + Pemetrexed (500) + Pembrolizumab (200) D1 q21d Induction x 4 cycles / Maintenance Pemetrexed (500) + Pembrolizumab (200) D1 q21d        RISK FACTORS:  Colonoscopy: no polyps per patient.  Hysterectomy: no.  Ovaries intact: yes.    Past Medical History:  Diagnosis Date   Anxiety    ON PAXIL, XANAX   AVM (arteriovenous malformation) brain    s/p stent/coil   Blood transfusion    x 2   Cancer (HCC)    Left lung   COPD (chronic obstructive pulmonary  disease) (HCC)    no inhaler, no oxygen   Coronary artery disease    Prior inferior MI with stent to RCA, s/p CABG in 2008   Depression    Dizziness    Dyspnea    with exertion, no oxygen   Dyspnea on exertion 10/02/2023   Fatigue    Foot injury 06/01/2017   right - RESOLVED, no longer an issue per patient 05/18/21   GERD (gastroesophageal reflux disease)    Headache(784.0)    UNRUPTURED CEREBRAL ANEURYSM   Hyperlipidemia    Hypertension    Hypothyroidism    Infected cyst of skin 09/18/2013   Left knee pain 06/05/2012   Lung mass    Left lung   Myocardial infarction (HCC)    Normal nuclear stress test Ju;y 2012   No ischemia. EF 70%; fixed defect involving septum, inferoseptal and inferior wall   Pain, dental 02/06/2017   Poor dental hygiene    Retroperitoneal bleeding    Following cardiac cath   Tobacco abuse    Urine discoloration 09/18/2013   Urine incontinence 07/06/2020   UTI (lower urinary tract infection) 10/16/2013    Past Surgical History:  Procedure Laterality Date   CARDIAC CATHETERIZATION  01/02/2007   IT REVEALS MILD INFERIOR WALL HYPOKINESIS. THE EJECTION FRACTION IS AROUND 50%   COLONOSCOPY     CORONARY ARTERY BYPASS GRAFT  11/06/2006   LIMA to LAD, SVG to DX, SVG to LCX & SVG to OM 1 & 2, and SVG to PD   CORONARY STENT PLACEMENT     Remote past stent to RCA   EYE SURGERY Right    cataracts removed   HEMORRHOID SURGERY  11/07/1987   IR IMAGING GUIDED PORT INSERTION  10/04/2022   UPPER GI ENDOSCOPY     VENTRICULOSTOMY  10/06/2011   Procedure: VENTRICULOSTOMY;  Surgeon: Carmela Hurt;  Location: [facility];  Service: Neurosurgery;  Laterality: Right;  Insertion of Ventriculostomy Catheter   VIDEO BRONCHOSCOPY WITH ENDOBRONCHIAL NAVIGATION Left 05/20/2021   Procedure: VIDEO BRONCHOSCOPY WITH ENDOBRONCHIAL NAVIGATION;  Surgeon: Josephine Igo, DO;  Location: [facility] OR;  Service: Pulmonary;  Laterality: Left;   VIDEO BRONCHOSCOPY WITH ENDOBRONCHIAL  ULTRASOUND Bilateral 05/20/2021   Procedure: VIDEO BRONCHOSCOPY WITH ENDOBRONCHIAL ULTRASOUND;  Surgeon: Josephine Igo, DO;  Location: [facility] OR;  Service: Pulmonary;  Laterality: Bilateral;   WISDOM TOOTH EXTRACTION      FAMILY HISTORY:  We obtained a detailed, 4-generation family history.  Significant diagnoses are listed below: Family History  Problem Relation Age of Onset   Breast cancer Mother 78   Mesothelioma Brother        absestos exposure; d. 48   Ovarian cancer Maternal Aunt        dx >50   Prostate cancer Maternal Uncle        mets; dx 22s   Thyroid cancer Paternal Aunt        dx >50   Lung cancer Cousin        mat female cousins   Squamous cell carcinoma Cousin        mets     Jill [name] is unaware of previous family history of genetic testing for hereditary cancer risks.  Other relatives are unavailable for genetic testing at this time.   There is no reported Ashkenazi Jewish ancestry. There is no known consanguinity.  GENETIC COUNSELING ASSESSMENT: Jill [name] is a 72 y.o. female with a family history which is somewhat suggestive of a hereditary cancer syndrome and predisposition to cancer given the presence of related cancers in multiple relatives (breast, ovarian, metastatic prostate). We, therefore, discussed and recommended the following at today's visit.   DISCUSSION: We discussed that 5 - 10% of cancer is hereditary.  Most cases of hereditary breast cancer are associated with mutations in BRCA1/2.  There are other genes that can be associated with hereditary breast, ovarian, prostate or other cancer syndromes.  We reviewed that while most lung cancer is familial and not hereditary, germline mutations in the EGFR gene can increase lung cancer risk.  We discussed that testing is beneficial for several reasons including knowing how to follow individuals for their cancer risks, identifying whether potential treatment options would be beneficial, and understanding if other  family members could be at risk for cancer and allowing them to undergo genetic testing.   We reviewed the characteristics, features and inheritance patterns of hereditary cancer syndromes. We also discussed genetic testing, including the appropriate family members to test, the process of testing, insurance coverage and turn-around-time for results. We discussed the implications of a negative, positive, carrier and/or variant of uncertain significant result. We recommended Jill [name] pursue genetic testing for a panel that includes genes associated with breast, ovarian, prostate, lung, thyroid, and other cancers.   The CustomNext-Cancer+RNAinsight (CancerNext +EGFR +thyroid cancer genes) panel offered by [employer] includes sequencing, rearrangement, and RNA  analysis for the following 43 genes:  APC, ATM, AXIN2, BAP1, BARD1, BMPR1A, BRCA1, BRCA2, BRIP1, CDH1, CDKN2A, CHEK2, DICER1, EGFR, EPCAM, FH, FLCN, GREM1, HOXB13, MBD4, MET, MLH1, MSH2, MSH3, MSH6, MUTYH, NF1, NTHL1, PALB2, PMS2, POLD1, POLE, PRKAR1A, PTEN, RAD51C, RAD51D, RET, SMAD4, STK11, TP53, TSC1, TSC2, VHL  Based on Jill [name]'s family history of breast, ovarian, and metastatic prostate cancer, she meets NCCN medical criteria for genetic testing. Other relatives are unavailable for genetic testing at this time. Despite that she meets criteria, she may still have an out of pocket cost. We discussed that if her out of pocket cost for testing is over $100, the laboratory should contact her and discuss the self-pay prices and/or patient pay assistance programs.    PLAN: After considering the risks, benefits, and limitations, Jill [name] provided informed consent to pursue genetic testing and the blood sample was sent to [facility] for analysis of the CustomNext-Cancer +RNAinsight Panel. Results should be available within approximately 3 weeks, at which point they will be disclosed by telephone to Jill [name], as will any additional  recommendations warranted by these results. Jill [name] will receive a summary of her genetic counseling visit and a copy of her results once available. This information will also be available in Epic.    Jill [name] questions were answered to her satisfaction today. Our contact information was provided should additional questions or concerns arise. Thank you for the referral and allowing [place] to share in the care of your patient.   Kortny Lirette M. Rennie Plowman, MS, [facility] Genetic Counselor [email or web address] (P) [phone]   40 minutes were spent on the date of the encounter in service to the patient including preparation, face-to-face consultation, documentation and care coordination.  The patient was accompanied by her daughter, Genevie Cheshire.  Drs. Gunnar Bulla and/or Mosetta Putt were available to discuss this case as needed.    _______________________________________________________________________ For Office Staff:  Number of people involved in session: 2 Was an Intern/ student involved with case: no

## 2023-12-10 NOTE — Telephone Encounter (Signed)
I left a detailed message for Ms. Freiman informing her of the brain MRI and telephone follow-up we have scheduled for her in June. My contact information was included for her to call back with questions or concerns.   Jalene Mullet R.T.(R)(T) Radiation Special Procedures Lead

## 2023-12-14 ENCOUNTER — Ambulatory Visit (INDEPENDENT_AMBULATORY_CARE_PROVIDER_SITE_OTHER): Payer: Medicare Other

## 2023-12-14 VITALS — BP 107/58 | HR 66 | Temp 97.9°F | Resp 20 | Ht 66.0 in | Wt 121.6 lb

## 2023-12-14 DIAGNOSIS — D509 Iron deficiency anemia, unspecified: Secondary | ICD-10-CM

## 2023-12-14 MED ORDER — ACETAMINOPHEN 325 MG PO TABS
650.0000 mg | ORAL_TABLET | Freq: Once | ORAL | Status: AC
  Administered 2023-12-14: 650 mg via ORAL
  Filled 2023-12-14: qty 2

## 2023-12-14 MED ORDER — SODIUM CHLORIDE 0.9 % IV SOLN
510.0000 mg | Freq: Once | INTRAVENOUS | Status: AC
  Administered 2023-12-14: 510 mg via INTRAVENOUS
  Filled 2023-12-14: qty 17

## 2023-12-14 MED ORDER — DIPHENHYDRAMINE HCL 25 MG PO CAPS
25.0000 mg | ORAL_CAPSULE | Freq: Once | ORAL | Status: AC
  Administered 2023-12-14: 25 mg via ORAL
  Filled 2023-12-14: qty 1

## 2023-12-14 NOTE — Progress Notes (Signed)
 Diagnosis: Iron Deficiency Anemia  Provider:  Praveen Mannam MD  Procedure: IV Infusion  IV Type: Peripheral, IV Location: right antecubital  Feraheme (Ferumoxytol ), Dose: 510 mg  Infusion Start Time: 1542  Infusion Stop Time: 1556  Post Infusion IV Care: Observation period completed and Peripheral IV Discontinued  Discharge: Condition: Good, Destination: Home . AVS Declined  Performed by:  Maximiano JONELLE Pouch, LPN

## 2023-12-16 ENCOUNTER — Other Ambulatory Visit: Payer: Self-pay | Admitting: Family Medicine

## 2023-12-21 MED ORDER — ACETAMINOPHEN 325 MG PO TABS: 650.0000 mg | ORAL_TABLET | Freq: Once | ORAL | Status: DC

## 2023-12-21 MED ORDER — DIPHENHYDRAMINE HCL 25 MG PO CAPS: 25.0000 mg | ORAL_CAPSULE | Freq: Once | ORAL | Status: DC

## 2023-12-24 ENCOUNTER — Telehealth: Payer: Self-pay | Admitting: Genetic Counselor

## 2023-12-24 ENCOUNTER — Encounter: Payer: Self-pay | Admitting: Genetic Counselor

## 2023-12-24 DIAGNOSIS — Z1379 Encounter for other screening for genetic and chromosomal anomalies: Secondary | ICD-10-CM | POA: Insufficient documentation

## 2023-12-24 MED ORDER — ACETAMINOPHEN 325 MG PO TABS: 650.0000 mg | ORAL_TABLET | Freq: Once | ORAL | Status: DC

## 2023-12-24 MED ORDER — DIPHENHYDRAMINE HCL 25 MG PO CAPS: 25.0000 mg | ORAL_CAPSULE | Freq: Once | ORAL | Status: DC

## 2023-12-24 NOTE — Telephone Encounter (Signed)
 Contacted patient in attempt to disclose results of genetic testing.  LVM with contact information requesting a call back.

## 2023-12-26 ENCOUNTER — Other Ambulatory Visit: Payer: Self-pay | Admitting: Family Medicine

## 2023-12-26 DIAGNOSIS — I10 Essential (primary) hypertension: Secondary | ICD-10-CM

## 2023-12-27 MED ORDER — ACETAMINOPHEN 325 MG PO TABS: 650.0000 mg | ORAL_TABLET | Freq: Once | ORAL | Status: DC

## 2023-12-27 MED ORDER — DIPHENHYDRAMINE HCL 25 MG PO CAPS
25.0000 mg | ORAL_CAPSULE | Freq: Once | ORAL | Status: DC
Start: 2023-12-27 — End: 2023-12-27

## 2023-12-28 ENCOUNTER — Other Ambulatory Visit: Payer: Self-pay

## 2023-12-28 ENCOUNTER — Ambulatory Visit: Payer: Self-pay | Admitting: Genetic Counselor

## 2023-12-28 DIAGNOSIS — Z8042 Family history of malignant neoplasm of prostate: Secondary | ICD-10-CM

## 2023-12-28 DIAGNOSIS — C3490 Malignant neoplasm of unspecified part of unspecified bronchus or lung: Secondary | ICD-10-CM

## 2023-12-28 DIAGNOSIS — Z8041 Family history of malignant neoplasm of ovary: Secondary | ICD-10-CM

## 2023-12-28 DIAGNOSIS — Z803 Family history of malignant neoplasm of breast: Secondary | ICD-10-CM

## 2023-12-28 DIAGNOSIS — Z1379 Encounter for other screening for genetic and chromosomal anomalies: Secondary | ICD-10-CM

## 2023-12-28 NOTE — Progress Notes (Signed)
HPI:   Jill [name] was previously seen in the [facility] due to a family history of breast, ovarian, and prostate cancer and concerns regarding a hereditary predisposition to cancer.    Jill [name] recent genetic test results were disclosed to her by telephone. These results and recommendations are discussed in more detail below.  CANCER HISTORY:  Jill [name] has a history of Stage IV non-small cell lung cancer, adenocarcinoma with brain metastasis, diagnosed in July 2022.   Oncology History  Primary adenocarcinoma of left upper lobe of lung (HCC)  05/16/2021 Initial Diagnosis   Primary adenocarcinoma of left upper lobe of lung (HCC)   06/20/2021 - 07/25/2021 Chemotherapy   Patient is on Treatment Plan : LUNG Carboplatin / Paclitaxel + XRT q7d     08/31/2021 - 08/07/2023 Chemotherapy   Patient is on Treatment Plan : LUNG Carboplatin (5) + Pemetrexed (500) + Pembrolizumab (200) D1 q21d Induction x 4 cycles / Maintenance Pemetrexed (500) + Pembrolizumab (200) D1 q21d     12/20/2023 Genetic Testing   Negative [facility] CustomNext-Cancer +RNAinsight Panel.  Report date is 12/20/2023.  The CustomNext-Cancer+RNAinsight (CancerNext +EGFR +thyroid cancer genes) panel offered by [employer] includes sequencing, rearrangement, and RNA analysis for the following 43 genes:   APC, ATM, BAP1, BARD1, BMPR1A, BRCA1, BRCA2, BRIP1, CDH1, CDKN2A, CHEK2, DICER1, FH, FLCN, MET, MLH1, MSH2, MSH6, MUTYH, NF1, NTHL1, PALB2, PMS2, PRKAR1A, PTEN, RAD51C, RAD51D, RET, SMAD4, STK11, TP53, TSC1, TSC2 and VHL (sequencing and deletion/duplication); AXIN2, EGFR, HOXB13, MBD4, MSH3, POLD1 and POLE (sequencing only); EPCAM and GREM1 (deletion/duplication only). RNA data is routinely analyzed for use in variant interpretation for all genes.     FAMILY HISTORY:  We obtained a detailed, 4-generation family history.  Significant diagnoses are listed below:      Family History  Problem Relation Age of Onset    Breast cancer Mother 51   Mesothelioma Brother          absestos exposure; d. 66   Ovarian cancer Maternal Aunt          dx >50   Prostate cancer Maternal Uncle          mets; dx 55s   Thyroid cancer Paternal Aunt          dx >50   Lung cancer Cousin          mat female cousins   Squamous cell carcinoma Cousin          mets       Jill [name] is unaware of previous family history of genetic testing for hereditary cancer risks.  Other relatives are unavailable for genetic testing at this time.    There is no reported Ashkenazi Jewish ancestry. There is no known consanguinity.    GENETIC TEST RESULTS:  The [facility] CustomNext-Cancer +RNAinsight Panel found no pathogenic mutations. .   The CustomNext-Cancer+RNAinsight (CancerNext +EGFR +thyroid cancer genes) panel offered by [employer] includes sequencing, rearrangement, and RNA analysis for the following 43 genes:   APC, ATM, BAP1, BARD1, BMPR1A, BRCA1, BRCA2, BRIP1, CDH1, CDKN2A, CHEK2, DICER1, FH, FLCN, MET, MLH1, MSH2, MSH6, MUTYH, NF1, NTHL1, PALB2, PMS2, PRKAR1A, PTEN, RAD51C, RAD51D, RET, SMAD4, STK11, TP53, TSC1, TSC2 and VHL (sequencing and deletion/duplication); AXIN2, EGFR, HOXB13, MBD4, MSH3, POLD1 and POLE (sequencing only); EPCAM and GREM1 (deletion/duplication only). RNA data is routinely analyzed for use in variant interpretation for all genes.  The test report has been scanned into EPIC and is located under the Molecular Pathology section  of the Results Review tab.  A portion of the result report is included below for reference. Genetic testing reported out on December 19, 2023.     Even though a pathogenic variant was not identified, possible explanations for the cancer in the family may include: There may be no hereditary risk for cancer in the family. The cancers in Jill [name] and/or her family may be sporadic/familial or due to other genetic and environmental factors.  Most cancer is not hereditary.  There may be a  gene mutation in one of these genes that current testing methods cannot detect but that chance is small. There could be another gene that has not yet been discovered, or that we have not yet tested, that is responsible for the cancer diagnoses in the family.  It is also possible there is a hereditary cause for the cancer in the family that Jill [name] did not inherit.   Therefore, it is important to remain in touch with cancer genetics in the future so that we can continue to offer [name] the most up to date genetic testing.    ADDITIONAL GENETIC TESTING:   [name] genetic testing was fairly extensive.  If there are additional relevant genes identified to increase cancer risk that can be analyzed in the future, we would be happy to discuss and coordinate this testing at that time.      CANCER SCREENING RECOMMENDATIONS:  Jill [name] test result is considered negative (normal).  This means that we have not identified a hereditary cause for her personal or family history of cancer at this time.   An individual's cancer risk and medical management are not determined by genetic test results alone. Overall cancer risk assessment incorporates additional factors, including personal medical history, family history, and any available genetic information that may result in a personalized plan for cancer prevention and surveillance. Therefore, it is recommended she continue to follow the cancer management and screening guidelines provided by her oncology and primary healthcare provider.   RECOMMENDATIONS FOR FAMILY MEMBERS:   Since she did not inherit a identifiable mutation in a cancer predisposition gene included on this panel, her children could not have inherited a known mutation from her in one of these genes. Individuals in this family might be at some increased risk of developing cancer, over the general population risk, due to the family history of cancer.  Individuals in the family should  notify their providers of the family history of cancer. We recommend women in this family have a yearly mammogram beginning at age 34, or 28 years younger than the earliest onset of cancer, an annual clinical breast exam, and perform monthly breast self-exams.  Risk models that take into account family history and hormonal history may be helpful in determining appropriate breast cancer screening options for family members.  Other members of the family may still carry a pathogenic variant in one of these genes that Ms. Borkowski did not inherit. Based on the family history, we recommend relatives more closely related to her maternal aunt who had ovarian cancer, as well as relatives more closely related to her maternal uncle who had prostate cancer, have genetic counseling/testing . Ms. Sturgell can let us know if we can be of any assistance in coordinating genetic counseling and/or testing for these family members.    FOLLOW-UP:  Cancer genetics is a rapidly advancing field and it is possible that new genetic tests will be appropriate for her and/or her family members in the  future. We encourage Ms. Exantus to remain in contact with cancer genetics, so we can update her personal and family histories and let her know of advances in cancer genetics that may benefit this family.   Our contact number was provided.  They are welcome to call us at anytime with additional questions or concerns.   Cornelio Parkerson M. Rennie Plowman, MS, [facility] Genetic Counselor [email or web address] (P) [phone]

## 2024-01-03 ENCOUNTER — Ambulatory Visit: Payer: Medicare Other

## 2024-01-07 ENCOUNTER — Ambulatory Visit: Payer: Medicare Other | Admitting: Student

## 2024-01-11 ENCOUNTER — Encounter: Payer: Self-pay | Admitting: Student

## 2024-01-11 ENCOUNTER — Ambulatory Visit (INDEPENDENT_AMBULATORY_CARE_PROVIDER_SITE_OTHER): Admitting: Student

## 2024-01-11 VITALS — BP 103/55 | HR 60 | Temp 97.9°F | Ht 66.0 in | Wt 116.1 lb

## 2024-01-11 DIAGNOSIS — J441 Chronic obstructive pulmonary disease with (acute) exacerbation: Secondary | ICD-10-CM

## 2024-01-11 DIAGNOSIS — E079 Disorder of thyroid, unspecified: Secondary | ICD-10-CM

## 2024-01-11 DIAGNOSIS — I959 Hypotension, unspecified: Secondary | ICD-10-CM

## 2024-01-11 MED ORDER — BREZTRI AEROSPHERE 160-9-4.8 MCG/ACT IN AERO: 2.0000 | INHALATION_SPRAY | Freq: Two times a day (BID) | RESPIRATORY_TRACT | Status: DC

## 2024-01-11 MED ORDER — ALBUTEROL SULFATE (2.5 MG/3ML) 0.083% IN NEBU
2.5000 mg | INHALATION_SOLUTION | Freq: Once | RESPIRATORY_TRACT | Status: AC
  Administered 2024-01-11: 2.5 mg via RESPIRATORY_TRACT

## 2024-01-11 MED ORDER — IPRATROPIUM-ALBUTEROL 0.5-2.5 (3) MG/3ML IN SOLN: 3.0000 mL | Freq: Once | RESPIRATORY_TRACT | Status: DC

## 2024-01-11 MED ORDER — IPRATROPIUM BROMIDE 0.02 % IN SOLN
0.5000 mg | Freq: Once | RESPIRATORY_TRACT | Status: AC
  Administered 2024-01-11: 0.5 mg via RESPIRATORY_TRACT

## 2024-01-11 MED ORDER — DOXYCYCLINE HYCLATE 100 MG PO TABS: 100.0000 mg | ORAL_TABLET | Freq: Two times a day (BID) | ORAL | 0 refills | Status: AC

## 2024-01-11 NOTE — Patient Instructions (Addendum)
 It was great to see you! Thank you for allowing me to participate in your care!   Our plans for today:  - I am sending doxycyline 500 mg 2 times daily for 5 days - Please continue albuterol every 4 hours as needed  - Please get chest x[facility] at [facility] - I am giving you a sample of breztri to take - we will get some labs today - Hold losartan and amlodipine until your next visit  Take care and seek immediate care sooner if you develop any concerns.  Levin Erp, MD

## 2024-01-11 NOTE — Progress Notes (Signed)
    SUBJECTIVE:   CHIEF COMPLAINT / HPI: Sick  Cough 4-5 days productive cough with yellow phlegm Using albuterol inhaler more No known fevers but did feel warm this AM and took 2 tylenols No sick contacts No hemoptysis Feels like COPD exacerbations she has had in past No chemotherapy-not since November, stopped Keytruda due to thyroid numbers Uses an albuterol inhaler for COPD but is unable to afford Anoro ellipta and has been off of this  Hypothyroidism Taking 137 mcg of synthroid a day every morning The patient's blood pressure is noted to be low, but the patient denies feeling dizzy.  Low BP  Initially BP 90/60, repeat 103/60. Denies any lightheadness/dizziness at beginning. On multiple antihypertensives.  PERTINENT  PMH / PSH: CAD, HTN,  primary malignant neoplasm of lung with metastasis to brain, GERD, hypothyroidism  OBJECTIVE:   BP (!) 103/55   Pulse 60   Temp 97.9 F (36.6 C) (Oral)   Ht 5\' 6"  (1.676 m)   Wt 116 lb 2 oz (52.7 kg)   SpO2 95%   BMI 18.74 kg/m   General: Cachectic appearance, NAD, awake, alert, responsive to questions Head: Normocephalic atraumatic CV: Regular rate and rhythm no murmurs rubs or gallops Respiratory:Poor air movement no wheezes/rales/crackles, chest rises symmetrically,  no increased work of breathing on RA Extremities: Moves upper and lower extremities freely, no edema in LE, no calf tenderness  On reassessment after duoneb: does have some increased air movement  ASSESSMENT/PLAN:   Assessment & Plan COPD exacerbation (HCC) Signs/sxs most consistent with COPD exacerbation. Has intolerance to steroids (delirium). Duoneb given here with mild improvement. HR for PE with malignancy but no hemoptysis, tachycardia, pleuritic CP or calf pain/swelling. Hx of pleural effusion likely from malignancy as well. -Start doxycycline BID for 5 days -CXR 2 view -Albuterol inhaler -Breztri sample provided-routing chart to pharmacy team to help  with long term maintenance for her -ED/return precautions discussed Thyroid dysfunction On synthroid 137 mcg daily -TSH w reflex T4 -Titrate pending results Hypotension, unspecified hypotension type Low BP in comparison to previous BP values. Not symptomatic. -Patient advised to hold home losartan 100 mg daily and amlodipine 10 mg daily -F/u for BP scheduled 3/13   Levin Erp, MD [facility]

## 2024-01-12 ENCOUNTER — Encounter: Payer: Self-pay | Admitting: Student

## 2024-01-12 LAB — T4F: T4,Free (Direct): 1.69 ng/dL (ref 0.82–1.77)

## 2024-01-12 LAB — TSH RFX ON ABNORMAL TO FREE T4: TSH: 0.078 u[IU]/mL — ABNORMAL LOW (ref 0.450–4.500)

## 2024-01-14 ENCOUNTER — Telehealth: Payer: Self-pay | Admitting: Student

## 2024-01-14 MED ORDER — LEVOTHYROXINE SODIUM 100 MCG PO TABS: 100.0000 ug | ORAL_TABLET | ORAL | 0 refills | Status: DC

## 2024-01-14 NOTE — Telephone Encounter (Signed)
 Called multiple times this weekend and 2 times this AM. Left VM to return call to clinic. If calls back, I need to lower her synthroid dosage to 100 mcg daily. Please let me know if she is okay with this.

## 2024-01-14 NOTE — Addendum Note (Signed)
 Addended by: Levin Erp on: 01/14/2024 11:37 AM   Modules accepted: Orders

## 2024-01-14 NOTE — Telephone Encounter (Signed)
 Patient returns call to nurse line.  Discussed Levothyroxine dose to 100 mcg.   She is amendable to this and requests a new prescription for 100 mcg.   Will forward to provider who saw patient.

## 2024-01-15 MED ORDER — ACETAMINOPHEN 325 MG PO TABS
650.0000 mg | ORAL_TABLET | Freq: Once | ORAL | Status: DC
Start: 2024-01-15 — End: 2024-01-29

## 2024-01-15 MED ORDER — DIPHENHYDRAMINE HCL 25 MG PO CAPS
25.0000 mg | ORAL_CAPSULE | Freq: Once | ORAL | Status: DC
Start: 2024-01-15 — End: 2024-01-29

## 2024-01-16 ENCOUNTER — Encounter: Payer: Self-pay | Admitting: Internal Medicine

## 2024-01-16 ENCOUNTER — Other Ambulatory Visit (HOSPITAL_COMMUNITY): Payer: Self-pay

## 2024-01-16 ENCOUNTER — Encounter: Payer: Self-pay | Admitting: Physician Assistant

## 2024-01-17 ENCOUNTER — Encounter: Payer: Self-pay | Admitting: Family Medicine

## 2024-01-17 ENCOUNTER — Ambulatory Visit: Admitting: Family Medicine

## 2024-01-17 VITALS — BP 128/62 | HR 90 | Ht 66.0 in | Wt 115.4 lb

## 2024-01-17 DIAGNOSIS — I10 Essential (primary) hypertension: Secondary | ICD-10-CM | POA: Diagnosis not present

## 2024-01-17 NOTE — Progress Notes (Signed)
    SUBJECTIVE:   CHIEF COMPLAINT / HPI:   Patient presents for blood pressure follow-up.  Blood pressure low at last visit with Dr.  Laroy Apple on 3/7.  Has not been taking amlodipine and losartan as was directed. Denies dizziness or worsening headaches. Has changed her synthroid dose to 100 mcg.   PERTINENT  PMH / PSH:   OBJECTIVE:   BP 128/62   Pulse 90   Ht 5\' 6"  (1.676 m)   Wt 115 lb 6.4 oz (52.3 kg)   SpO2 92%   BMI 18.63 kg/m   General: A&O, NAD HEENT: No sign of trauma, EOM grossly intact Respiratory: normal WOB GI: non-distended  Extremities: no peripheral edema. Neuro: Normal gait, moves all four extremities appropriately Skin: no lesions/rashes visualized Psych: Appropriate mood and affect   ASSESSMENT/PLAN:   Assessment & Plan Primary hypertension Patient has history of hypertension however was hypotensive at last visit so discontinued home losartan 100 mg daily and amlodipine 10 mg daily.  Patient normotensive on exam today.  Will have her continue to hold losartan and amlodipine.  Patient to follow-up with PCP in 1 month       Hal Morales, MD [facility]

## 2024-01-17 NOTE — Patient Instructions (Signed)
 It was great to see you today! Thank you for choosing [facility] for your primary care. Jill [name] was seen for blood pressure follow up.  Today we addressed: Your blood pressure looks great! Continue holding the amlodipine and losartan.  Schedule a follow up with Dr. Lum Babe in one month   If you haven't already, sign up for My Chart to have easy access to your labs results, and communication with your primary care physician.  We are checking some labs today. If they are abnormal, I will call you. If they are normal, I will send you a MyChart message (if it is active) or a letter in the mail. If you do not hear about your labs in the next 2 weeks, please call the office.   You should return to our clinic Return in about 4 weeks (around 02/14/2024).  I recommend that you always bring your medications to each appointment as this makes it easy to ensure you are on the correct medications and helps [place] not miss refills when you need them.  Please arrive 15 minutes before your appointment to ensure smooth check in process.  We appreciate your efforts in making this happen.  Please call the clinic at [phone] if your symptoms worsen or you have any concerns.  Thank you for allowing me to participate in your care, Jill Morales, MD 01/17/2024, 4:10 PM PGY-1, [facility]

## 2024-01-17 NOTE — Assessment & Plan Note (Signed)
 Patient has history of hypertension however was hypotensive at last visit so discontinued home losartan 100 mg daily and amlodipine 10 mg daily.  Patient normotensive on exam today.  Will have her continue to hold losartan and amlodipine.  Patient to follow-up with PCP in 1 month

## 2024-01-18 ENCOUNTER — Telehealth: Payer: Self-pay

## 2024-01-18 ENCOUNTER — Other Ambulatory Visit (HOSPITAL_COMMUNITY): Payer: Self-pay

## 2024-01-19 ENCOUNTER — Other Ambulatory Visit: Payer: Self-pay | Admitting: Family Medicine

## 2024-01-22 ENCOUNTER — Ambulatory Visit

## 2024-01-22 NOTE — Progress Notes (Signed)
 Pharmacy Medication Assistance Program Note    02/06/2024  Patient ID: Jill [name], female   DOB: 10-Sep-1951, 73 y.o.   MRN: [id number]     01/18/2024  Outreach Medication One  Manufacturer Medication One Nurse, adult Drugs Bretztri  Type of Radiographer, therapeutic Assistance  Date Application Sent to Patient 01/22/2024  Date Application Received From Patient 02/05/2024  Application Items Received From Patient Application  Date Application Submitted to Manufacturer 02/06/2024  Method Application Sent to Manufacturer Fax     PLEASE SEND A 90 DAY RX (WITH REFILLS) TO [facility] FOR PATIENTS ASSISTANCE ENROLLMENT. THANKS!

## 2024-01-22 NOTE — Progress Notes (Signed)
 Patient contacted for follow-up of need for [facility] Medication Assistance  Since last contact patient reports having a supply of ~ 7 days for [employer].  Current Medications include: Breztri Patient denies any significant medication related side effects.  Medication Plan: -Continue Breztri (2 puffs twice daily)  -Asked patient to pick up [place] and [place] Med Assistance Application with the samples provided for pick-up on Friday 3/21 - placed at front desk for pick-up  Medication Samples have been provided to the patient.  Drug name: Selena Batten: 2  (14 day supply) LOT: [id number]  Exp.Date: 08/05/2026  Dosing instructions: 2 inhalations twice daily  The patient has been instructed regarding the correct time, dose, and frequency of taking this medication, including desired effects and most common side effects.   Madelon Lips 2:01 PM 01/22/2024  Total time with patient call and documentation of interaction: 11 minutes.

## 2024-01-25 ENCOUNTER — Ambulatory Visit (HOSPITAL_COMMUNITY)
Admission: RE | Admit: 2024-01-25 | Discharge: 2024-01-25 | Disposition: A | Discharge status: Home / Self Care | Payer: Medicare Other | Source: Ambulatory Visit | Attending: Physician Assistant | Admitting: Physician Assistant

## 2024-01-25 ENCOUNTER — Inpatient Hospital Stay: Payer: Medicare HMO | Attending: Internal Medicine

## 2024-01-25 DIAGNOSIS — C3490 Malignant neoplasm of unspecified part of unspecified bronchus or lung: Secondary | ICD-10-CM

## 2024-01-25 DIAGNOSIS — J90 Pleural effusion, not elsewhere classified: Secondary | ICD-10-CM | POA: Diagnosis not present

## 2024-01-25 DIAGNOSIS — C3412 Malignant neoplasm of upper lobe, left bronchus or lung: Secondary | ICD-10-CM | POA: Insufficient documentation

## 2024-01-25 DIAGNOSIS — Z95828 Presence of other vascular implants and grafts: Secondary | ICD-10-CM

## 2024-01-25 DIAGNOSIS — Z79899 Other long term (current) drug therapy: Secondary | ICD-10-CM | POA: Diagnosis not present

## 2024-01-25 DIAGNOSIS — C7931 Secondary malignant neoplasm of brain: Secondary | ICD-10-CM | POA: Insufficient documentation

## 2024-01-25 DIAGNOSIS — C3492 Malignant neoplasm of unspecified part of left bronchus or lung: Secondary | ICD-10-CM | POA: Diagnosis not present

## 2024-01-25 LAB — CMP (CANCER CENTER ONLY)
ALT: 10 U/L (ref 0–44)
AST: 17 U/L (ref 15–41)
Albumin: 3.4 g/dL — ABNORMAL LOW (ref 3.5–5.0)
Alkaline Phosphatase: 90 U/L (ref 38–126)
Anion gap: 7 (ref 5–15)
BUN: 10 mg/dL (ref 8–23)
CO2: 30 mmol/L (ref 22–32)
Calcium: 9.3 mg/dL (ref 8.9–10.3)
Chloride: 102 mmol/L (ref 98–111)
Creatinine: 0.74 mg/dL (ref 0.44–1.00)
GFR, Estimated: >60 mL/min (ref >60)
Glucose, Bld: 102 mg/dL — ABNORMAL HIGH (ref 70–99)
Potassium: 3.8 mmol/L (ref 3.5–5.1)
Sodium: 139 mmol/L (ref 135–145)
Total Bilirubin: 0.4 mg/dL (ref 0.0–1.2)
Total Protein: 7.2 g/dL (ref 6.5–8.1)

## 2024-01-25 LAB — CBC WITH DIFFERENTIAL (CANCER CENTER ONLY)
Abs Immature Granulocytes: 0.02 10*3/uL (ref 0.00–0.07)
Basophils Absolute: 0.1 10*3/uL (ref 0.0–0.1)
Basophils Relative: 1 %
Eosinophils Absolute: 0.1 10*3/uL (ref 0.0–0.5)
Eosinophils Relative: 2 %
HCT: 36.8 % (ref 36.0–46.0)
Hemoglobin: 11.9 g/dL — ABNORMAL LOW (ref 12.0–15.0)
Immature Granulocytes: 0 %
Lymphocytes Relative: 14 %
Lymphs Abs: 1.1 10*3/uL (ref 0.7–4.0)
MCH: 30.5 pg (ref 26.0–34.0)
MCHC: 32.3 g/dL (ref 30.0–36.0)
MCV: 94.4 fL (ref 80.0–100.0)
Monocytes Absolute: 0.6 10*3/uL (ref 0.1–1.0)
Monocytes Relative: 8 %
Neutro Abs: 5.6 10*3/uL (ref 1.7–7.7)
Neutrophils Relative %: 75 %
Platelet Count: 377 10*3/uL (ref 150–400)
RBC: 3.9 MIL/uL (ref 3.87–5.11)
RDW: 17.4 % — ABNORMAL HIGH (ref 11.5–15.5)
WBC Count: 7.4 10*3/uL (ref 4.0–10.5)
nRBC: 0 % (ref 0.0–0.2)

## 2024-01-25 MED ORDER — IOHEXOL 300 MG/ML  SOLN
100.0000 mL | Freq: Once | INTRAMUSCULAR | Status: AC | PRN
  Administered 2024-01-25: 100 mL via INTRAVENOUS

## 2024-01-25 MED ORDER — HEPARIN SOD (PORK) LOCK FLUSH 100 UNIT/ML IV SOLN
500.0000 [IU] | Freq: Once | INTRAVENOUS | Status: AC
  Administered 2024-01-25: 500 [IU] via INTRAVENOUS

## 2024-01-25 MED ORDER — SODIUM CHLORIDE 0.9% FLUSH
10.0000 mL | Freq: Once | INTRAVENOUS | Status: AC
  Administered 2024-01-25: 10 mL

## 2024-01-29 ENCOUNTER — Encounter: Payer: Self-pay | Admitting: Physician Assistant

## 2024-01-29 ENCOUNTER — Encounter: Payer: Self-pay | Admitting: Internal Medicine

## 2024-02-04 ENCOUNTER — Ambulatory Visit

## 2024-02-04 VITALS — Ht 66.0 in | Wt 118.0 lb

## 2024-02-04 DIAGNOSIS — Z0000 Encounter for general adult medical examination without abnormal findings: Secondary | ICD-10-CM | POA: Diagnosis not present

## 2024-02-04 NOTE — Patient Instructions (Signed)
 Jill [name] , Thank you for taking time to come for your Medicare Wellness Visit. I appreciate your ongoing commitment to your health goals. Please review the following plan we discussed and let me know if I can assist you in the future.   Referrals/Orders/Follow-Ups/Clinician Recommendations: Yes; Keep maintaining your health by keeping your appointments with Dr. Lum Babe and any specialists that you may see.  Call us if you need anything.  Have a great year!!!!  This is a list of the screening recommended for you and due dates:  Health Maintenance  Topic Date Due   Zoster (Shingles) Vaccine (1 of 2) Never done   DTaP/Tdap/Td vaccine (2 - Td or Tdap) 06/05/2022   Cologuard (Stool DNA test)  08/03/2023   DEXA scan (bone density measurement)  02/05/2024*   COVID-19 Vaccine (6 - Pfizer risk 2024-25 season) 03/06/2024*   Medicare Annual Wellness Visit  02/03/2025   Pneumonia Vaccine  Completed   Flu Shot  Completed   Hepatitis C Screening  Completed   HPV Vaccine  Aged Out  *Topic was postponed. The date shown is not the original due date.    Advanced directives: (Declined) Advance directive discussed with you today. Even though you declined this today, please call our office should you change your mind, and we can give you the proper paperwork for you to fill out.  Next Medicare Annual Wellness Visit scheduled for next year: Yes

## 2024-02-04 NOTE — Progress Notes (Signed)
 Because this visit was a virtual/telehealth visit,  certain criteria was not obtained, such a blood pressure, CBG if applicable, and timed get up and go. Any medications not marked as "taking" were not mentioned during the medication reconciliation part of the visit. Any vitals not documented were not able to be obtained due to this being a telehealth visit or patient was unable to self-report a recent blood pressure reading due to a lack of equipment at home via telehealth. Vitals that have been documented are verbally provided by the patient.   Subjective:   Jill [name] is a 73 y.o. who presents for a Medicare Wellness preventive visit.  Visit Complete: Virtual I connected with  Jill [name] on 02/04/24 by a audio enabled telemedicine application and verified that I am speaking with the correct person using two identifiers.  Patient Location: Home  Provider Location: Office/Clinic  I discussed the limitations of evaluation and management by telemedicine. The patient expressed understanding and agreed to proceed.  Vital Signs: Because this visit was a virtual/telehealth visit, some criteria may be missing or patient reported. Any vitals not documented were not able to be obtained and vitals that have been documented are patient reported.  VideoDeclined- This patient declined Librarian, academic. Therefore the visit was completed with audio only.  Persons Participating in Visit: Patient.  AWV Questionnaire: No: Patient Medicare AWV questionnaire was not completed prior to this visit.  Cardiac Risk Factors include: advanced age (>56men, >60 women);sedentary lifestyle;dyslipidemia;hypertension;smoking/ tobacco exposure     Objective:    Today's Vitals   02/04/24 1138  Weight: 118 lb (53.5 kg)  Height: 5\' 6"  (1.676 m)  PainSc: 3   PainLoc: Knee   Body mass index is 19.05 kg/m.     02/04/2024   11:41 AM 01/11/2024    3:25 PM 12/14/2023    3:05 PM  10/09/2023    9:25 AM 10/02/2023   12:26 PM 10/02/2023   10:48 AM 08/28/2023    9:36 AM  Advanced Directives  Does Patient Have a Medical Advance Directive? No No No No No No No  Would patient like information on creating a medical advance directive? No - Patient declined No - Patient declined No - Patient declined No - Patient declined Yes (Inpatient - patient defers creating a medical advance directive at this time - Information given) No - Patient declined No - Patient declined    Current Medications (verified) Outpatient Encounter Medications as of 02/04/2024  Medication Sig   acetaminophen (TYLENOL) 500 MG tablet Take 500-1,000 mg by mouth every 6 (six) hours as needed for moderate pain (pain score 4-6).   albuterol (PROVENTIL) (2.5 MG/3ML) 0.083% nebulizer solution Take 3 mLs (2.5 mg total) by nebulization every 6 (six) hours as needed for wheezing or shortness of breath.   albuterol (VENTOLIN HFA) 108 (90 Base) MCG/ACT inhaler Inhale 2 puffs into the lungs every 6 (six) hours as needed for wheezing or shortness of breath.   amLODipine (NORVASC) 10 MG tablet TAKE 1 TABLET BY MOUTH ONCE DAILY . APPOINTMENT REQUIRED FOR FUTURE REFILLS (Patient not taking: Reported on 02/04/2024)   aspirin EC 81 MG tablet Take 1 tablet (81 mg total) by mouth daily.   atorvastatin (LIPITOR) 40 MG tablet TAKE 1 TABLET BY MOUTH EVERY DAY   azithromycin (ZITHROMAX Z-PAK) 250 MG tablet Take 2 tablet today and then 1 tablets daily x 4 days   Budeson-Glycopyrrol-Formoterol (BREZTRI AEROSPHERE) 160-9-4.8 MCG/ACT AERO Inhale 2 puffs into the  lungs in the morning and at bedtime.   Cholecalciferol (VITAMIN D3 PO) Take 1 tablet by mouth daily.   clonazePAM (KLONOPIN) 0.5 MG tablet Take 1 tablet (0.5 mg total) by mouth 2 (two) times daily as needed for anxiety.   dronabinol (MARINOL) 2.5 MG capsule Take 1 capsule (2.5 mg total) by mouth 2 (two) times daily before a meal.   escitalopram (LEXAPRO) 10 MG tablet TAKE 1 TABLET  BY MOUTH EVERY DAY   ferrous sulfate 324 MG TBEC Take 1 tablet (324 mg total) by mouth daily with breakfast.   folic acid (FOLVITE) 1 MG tablet TAKE 1 TABLET BY MOUTH EVERY DAY   lactose free nutrition (BOOST PLUS) LIQD Take 237 mLs by mouth 2 (two) times daily between meals.   levothyroxine (SYNTHROID) 100 MCG tablet Take 1 tablet (100 mcg total) by mouth every morning. 30 minutes before food   lidocaine-prilocaine (EMLA) cream Apply 1 Application topically as needed.   losartan (COZAAR) 100 MG tablet TAKE 1 TABLET BY MOUTH EVERY DAY (Patient not taking: Reported on 02/04/2024)   metoprolol succinate (TOPROL-XL) 50 MG 24 hr tablet TAKE 1 TABLET BY MOUTH ONCE DAILY WITH MEALS OR IMMEDIATLEY AFTER A MEAL   Multiple Vitamins-Minerals (MULTIVITAMIN WITH MINERALS) tablet Take 1 tablet by mouth daily.   nitroGLYCERIN (NITROSTAT) 0.4 MG SL tablet Place 1 tablet (0.4 mg total) under the tongue every 5 (five) minutes as needed. For chest pain   prochlorperazine (COMPAZINE) 10 MG tablet Take 1 tablet (10 mg total) by mouth every 6 (six) hours as needed for nausea or vomiting.   senna ([employer] SENNA) 8.6 MG tablet TAKE 1 TABLET (8.6 MG TOTAL) BY MOUTH 2 (TWO) TIMES DAILY AS NEEDED FOR MILD CONSTIPATION.   umeclidinium-vilanterol (ANORO ELLIPTA) 62.5-25 MCG/ACT AEPB Inhale 1 puff into the lungs daily.   VITAMIN A PO Take 1 tablet by mouth daily.   No facility-administered encounter medications on file as of 02/04/2024.    Allergies (verified) Varenicline, Ace inhibitors, Prednisone, Betalin 12 [vitamin b12], Penicillins, and Sulfa drugs cross reactors   History: Past Medical History:  Diagnosis Date   Anxiety    ON PAXIL, XANAX   AVM (arteriovenous malformation) brain    s/p stent/coil   Blood transfusion    x 2   Cancer (HCC)    Left lung   COPD (chronic obstructive pulmonary disease) (HCC)    no inhaler, no oxygen   Coronary artery disease    Prior inferior MI with stent to RCA, s/p CABG in 2008    Depression    Dizziness    Dyspnea    with exertion, no oxygen   Dyspnea on exertion 10/02/2023   Fatigue    Foot injury 06/01/2017   right - RESOLVED, no longer an issue per patient 05/18/21   GERD (gastroesophageal reflux disease)    Headache(784.0)    UNRUPTURED CEREBRAL ANEURYSM   Hyperlipidemia    Hypertension    Hypothyroidism    Infected cyst of skin 09/18/2013   Left knee pain 06/05/2012   Lung mass    Left lung   Myocardial infarction (HCC)    Normal nuclear stress test Ju;y 2012   No ischemia. EF 70%; fixed defect involving septum, inferoseptal and inferior wall   Pain, dental 02/06/2017   Poor dental hygiene    Retroperitoneal bleeding    Following cardiac cath   Tobacco abuse    Urine discoloration 09/18/2013   Urine incontinence 07/06/2020   UTI (lower urinary tract  infection) 10/16/2013   Past Surgical History:  Procedure Laterality Date   CARDIAC CATHETERIZATION  01/02/2007   IT REVEALS MILD INFERIOR WALL HYPOKINESIS. THE EJECTION FRACTION IS AROUND 50%   COLONOSCOPY     CORONARY ARTERY BYPASS GRAFT  11/06/2006   LIMA to LAD, SVG to DX, SVG to LCX & SVG to OM 1 & 2, and SVG to PD   CORONARY STENT PLACEMENT     Remote past stent to RCA   EYE SURGERY Right    cataracts removed   HEMORRHOID SURGERY  11/07/1987   IR IMAGING GUIDED PORT INSERTION  10/04/2022   UPPER GI ENDOSCOPY     VENTRICULOSTOMY  10/06/2011   Procedure: VENTRICULOSTOMY;  Surgeon: Carmela Hurt;  Location: [facility];  Service: Neurosurgery;  Laterality: Right;  Insertion of Ventriculostomy Catheter   VIDEO BRONCHOSCOPY WITH ENDOBRONCHIAL NAVIGATION Left 05/20/2021   Procedure: VIDEO BRONCHOSCOPY WITH ENDOBRONCHIAL NAVIGATION;  Surgeon: Josephine Igo, DO;  Location: [facility] OR;  Service: Pulmonary;  Laterality: Left;   VIDEO BRONCHOSCOPY WITH ENDOBRONCHIAL ULTRASOUND Bilateral 05/20/2021   Procedure: VIDEO BRONCHOSCOPY WITH ENDOBRONCHIAL ULTRASOUND;  Surgeon: Josephine Igo, DO;   Location: [facility] OR;  Service: Pulmonary;  Laterality: Bilateral;   WISDOM TOOTH EXTRACTION     Family History  Problem Relation Age of Onset   Breast cancer Mother 60   Diabetes Mother    Alcohol abuse Father    Asthma Father    Diabetes Brother    Mesothelioma Brother        absestos exposure; d. 61   Ovarian cancer Maternal Aunt        dx >50   Prostate cancer Maternal Uncle        mets; dx 71s   Thyroid cancer Paternal Aunt        dx >50   Lung cancer Cousin        mat female cousins   Squamous cell carcinoma Cousin        mets   Heart disease Neg Hx    Social History   Socioeconomic History   Marital status: Married    Spouse name: Not on file   Number of children: Not on file   Years of education: Not on file   Highest education level: Not on file  Occupational History   Not on file  Tobacco Use   Smoking status: Every Day    Current packs/day: 0.50    Average packs/day: 0.5 packs/day for 66.2 years (33.1 ttl pk-yrs)    Types: Cigarettes    Start date: 11/06/1957   Smokeless tobacco: Never   Tobacco comments:    Has smoked 3 ppd, current 1.5-2 ppd    She will continue to work on cutting back on the amount of tobacco use  Vaping Use   Vaping status: Never Used  Substance and Sexual Activity   Alcohol use: No   Drug use: No   Sexual activity: Yes    Birth control/protection: Post-menopausal  Other Topics Concern   Not on file  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 worked in [employer] most recently.      Lives with husband and 2 adult daughters and 4 grandchildren.   Social Drivers of Corporate investment banker Strain: Low Risk  (02/04/2024)   Overall Financial Resource Strain (CARDIA)    Difficulty of Paying Living Expenses: Not hard at all  Food Insecurity: No Food Insecurity (02/04/2024)   Hunger Vital Sign    Worried About Running Out of Food in the Last Year:  Never true    Ran Out of Food in the Last Year: Never true  Transportation Needs: No Transportation Needs (02/04/2024)   PRAPARE - Administrator, Civil Service (Medical): No    Lack of Transportation (Non-Medical): No  Physical Activity: Inactive (02/04/2024)   Exercise Vital Sign    Days of Exercise per Week: 0 days    Minutes of Exercise per Session: 0 min  Stress: No Stress Concern Present (02/04/2024)   [employer] of Occupational Health - Occupational Stress Questionnaire    Feeling of Stress : Not at all  Social Connections: Not on file    Tobacco Counseling Ready to quit: Not Answered Counseling given: Not Answered Tobacco comments: Has smoked 3 ppd, current 1.5-2 ppd She will continue to work on cutting back on the amount of tobacco use    Clinical Intake:  Pre-visit preparation completed: Yes  Pain : 0-10 Pain Score: 3  Pain Type: Chronic pain Pain Location: Knee Pain Orientation: Left     BMI - recorded: 19.05 Nutritional Status: BMI of 19-24  Normal Nutritional Risks: None Diabetes: No  Lab Results  Component Value Date   HGBA1C 5.6 02/13/2023   HGBA1C 6.0 04/12/2021   HGBA1C 6.0 07/06/2020     How often do you need to have someone help you when you read instructions, pamphlets, or other written materials from your doctor or pharmacy?: 1 - Never What is the last grade level you completed in school?: HSG; SOME COLLEGE  Interpreter Needed?: No  Information entered by :: Susie Cassette, LPN.   Activities of Daily Living     02/04/2024   11:45 AM 10/02/2023   12:26 PM  In your present state of health, do you have any difficulty performing the following activities:  Hearing? 0 0  Vision? 1 0  Difficulty concentrating or making decisions? 0 0  Walking or climbing stairs? 1   Dressing or bathing? 0   Doing errands, shopping? 0   Preparing Food and eating ? N   Using the Toilet? N   In the past six months, have you accidently  leaked urine? Y   Do you have problems with loss of bowel control? N   Managing your Medications? N   Managing your Finances? N   Housekeeping or managing your Housekeeping? N     Patient Care Team: [name], MD as PCP - General (Family Medicine)  Indicate any recent Medical Services you may have received from other than [facility] providers in the past year (date may be approximate).     Assessment:   This is a routine wellness examination for Jill [name].  Hearing/Vision screen Hearing Screening - Comments:: Denies hearing difficulties.  Vision Screening - Comments:: Wears reading glasses - not up to date with routine eye exams. Cataract in left eye.    Goals Addressed             This Visit's Progress    Patient stated: None at this time.         Depression Screen     02/04/2024   11:43 AM 01/17/2024    3:28 PM 01/11/2024    3:25 PM 12/14/2023    3:08 PM 10/09/2023    9:26 AM 10/02/2023   10:47 AM 08/28/2023    9:33 AM  PHQ 2/9 Scores  PHQ -  2 Score 4 4 6 3 6 6 2   PHQ- 9 Score 15 18 14 8 17 18 10   Exception Documentation    Other- indicate reason in comment box     Not completed    family member helpedv answer these questions       Fall Risk     02/04/2024   11:43 AM 01/17/2024    3:28 PM 01/11/2024    3:25 PM 12/14/2023    3:04 PM 10/09/2023    9:25 AM  Fall Risk   Falls in the past year? 1 1 1 1  0  Number falls in past yr: 1 1 0 1 0  Injury with Fall? 1 1 1  0 0  Risk for fall due to : History of fall(s);Impaired balance/gait;Orthopedic patient   History of fall(s);Impaired balance/gait   Risk for fall due to: Comment    bad knee   Follow up Falls prevention discussed;Falls evaluation completed   Falls evaluation completed     MEDICARE RISK AT HOME:  Medicare Risk at Home Any stairs in or around the home?: Yes If so, are there any without handrails?: No Home free of loose throw rugs in walkways, pet beds, electrical cords, etc?: Yes Adequate lighting in your  home to reduce risk of falls?: Yes Life alert?: No Use of a cane, walker or w/c?: No Grab bars in the bathroom?: Yes (tub) Shower chair or bench in shower?: No Elevated toilet seat or a handicapped toilet?: No  TIMED UP AND GO:  Was the test performed?  No  Cognitive Function: 6CIT completed    02/04/2024   11:45 AM  MMSE - Mini Mental State Exam  Not completed: Unable to complete        02/04/2024   11:45 AM 02/13/2023   11:30 AM  6CIT Screen  What Year? 0 points 0 points  What month? 0 points 0 points  What time? 0 points 0 points  Count back from 20 0 points 0 points  Months in reverse 0 points 0 points  Repeat phrase 0 points 0 points  Total Score 0 points 0 points    Immunizations Immunization History  Administered Date(s) Administered   Fluad Trivalent(High Dose 65+) 07/31/2023   Influenza,inj,Quad PF,6+ Mos 08/29/2016, 08/21/2018, 10/28/2019, 07/06/2020   PFIZER Comirnaty(Gray Top)Covid-19 Tri-Sucrose Vaccine 04/12/2021   PFIZER(Purple Top)SARS-COV-2 Vaccination 04/15/2020, 05/06/2020   PNEUMOCOCCAL CONJUGATE-20 04/12/2021   Pfizer(Comirnaty)Fall Seasonal Vaccine 12 years and older 02/13/2023, 07/31/2023   Pneumococcal Conjugate-13 08/29/2016   Pneumococcal Polysaccharide-23 01/24/2018   Tdap 06/05/2012    Screening Tests Health Maintenance  Topic Date Due   Zoster Vaccines- Shingrix (1 of 2) Never done   DTaP/Tdap/Td (2 - Td or Tdap) 06/05/2022   Fecal DNA (Cologuard)  08/03/2023   DEXA SCAN  02/05/2024 (Originally 03/08/2014)   COVID-19 Vaccine (6 - Pfizer risk 2024-25 season) 03/06/2024 (Originally 01/28/2024)   Medicare Annual Wellness (AWV)  02/03/2025   Pneumonia Vaccine 81+ Years old  Completed   INFLUENZA VACCINE  Completed   Hepatitis C Screening  Completed   HPV VACCINES  Aged Out    Health Maintenance  Health Maintenance Due  Topic Date Due   Zoster Vaccines- Shingrix (1 of 2) Never done   DTaP/Tdap/Td (2 - Td or Tdap) 06/05/2022   Fecal  DNA (Cologuard)  08/03/2023   Health Maintenance Items Addressed: See Nurse Notes  Additional Screening:  Vision Screening: Recommended annual ophthalmology exams for early detection of glaucoma and other disorders of  the eye.  Dental Screening: Recommended annual dental exams for proper oral hygiene  Community Resource Referral / Chronic Care Management: CRR required this visit?  No   CCM required this visit?  No     Plan:     I have personally reviewed and noted the following in the patient's chart:   Medical and social history Use of alcohol, tobacco or illicit drugs  Current medications and supplements including opioid prescriptions. Patient is not currently taking opioid prescriptions. Functional ability and status Nutritional status Physical activity Advanced directives List of other physicians Hospitalizations, surgeries, and ER visits in previous 12 months Vitals Screenings to include cognitive, depression, and falls Referrals and appointments  In addition, I have reviewed and discussed with patient certain preventive protocols, quality metrics, and best practice recommendations. A written personalized care plan for preventive services as well as general preventive health recommendations were provided to patient.     Mickeal Needy, LPN   1/61/0960   After Visit Summary: (MyChart) Due to this being a telephonic visit, the after visit summary with patients personalized plan was offered to patient via MyChart   Notes: Nothing significant to report at this time.

## 2024-02-06 ENCOUNTER — Other Ambulatory Visit: Payer: Self-pay | Admitting: Family Medicine

## 2024-02-06 DIAGNOSIS — J441 Chronic obstructive pulmonary disease with (acute) exacerbation: Secondary | ICD-10-CM

## 2024-02-06 MED ORDER — BREZTRI AEROSPHERE 160-9-4.8 MCG/ACT IN AERO: 2.0000 | INHALATION_SPRAY | Freq: Two times a day (BID) | RESPIRATORY_TRACT | 2 refills | Status: DC

## 2024-02-07 ENCOUNTER — Inpatient Hospital Stay: Payer: Medicare HMO | Attending: Internal Medicine | Admitting: Internal Medicine

## 2024-02-07 DIAGNOSIS — K449 Diaphragmatic hernia without obstruction or gangrene: Secondary | ICD-10-CM | POA: Insufficient documentation

## 2024-02-07 DIAGNOSIS — Z8719 Personal history of other diseases of the digestive system: Secondary | ICD-10-CM | POA: Diagnosis not present

## 2024-02-07 DIAGNOSIS — Z79899 Other long term (current) drug therapy: Secondary | ICD-10-CM | POA: Insufficient documentation

## 2024-02-07 DIAGNOSIS — Z880 Allergy status to penicillin: Secondary | ICD-10-CM | POA: Diagnosis not present

## 2024-02-07 DIAGNOSIS — K314 Gastric diverticulum: Secondary | ICD-10-CM | POA: Insufficient documentation

## 2024-02-07 DIAGNOSIS — Z923 Personal history of irradiation: Secondary | ICD-10-CM | POA: Insufficient documentation

## 2024-02-07 DIAGNOSIS — C7931 Secondary malignant neoplasm of brain: Secondary | ICD-10-CM | POA: Diagnosis not present

## 2024-02-07 DIAGNOSIS — M8580 Other specified disorders of bone density and structure, unspecified site: Secondary | ICD-10-CM | POA: Insufficient documentation

## 2024-02-07 DIAGNOSIS — Z882 Allergy status to sulfonamides status: Secondary | ICD-10-CM | POA: Insufficient documentation

## 2024-02-07 DIAGNOSIS — J90 Pleural effusion, not elsewhere classified: Secondary | ICD-10-CM | POA: Diagnosis not present

## 2024-02-07 DIAGNOSIS — C3490 Malignant neoplasm of unspecified part of unspecified bronchus or lung: Secondary | ICD-10-CM

## 2024-02-07 DIAGNOSIS — J479 Bronchiectasis, uncomplicated: Secondary | ICD-10-CM | POA: Insufficient documentation

## 2024-02-07 DIAGNOSIS — M51369 Other intervertebral disc degeneration, lumbar region without mention of lumbar back pain or lower extremity pain: Secondary | ICD-10-CM | POA: Insufficient documentation

## 2024-02-07 DIAGNOSIS — Z7982 Long term (current) use of aspirin: Secondary | ICD-10-CM | POA: Insufficient documentation

## 2024-02-07 DIAGNOSIS — E785 Hyperlipidemia, unspecified: Secondary | ICD-10-CM | POA: Insufficient documentation

## 2024-02-07 DIAGNOSIS — Z9221 Personal history of antineoplastic chemotherapy: Secondary | ICD-10-CM | POA: Insufficient documentation

## 2024-02-07 DIAGNOSIS — Z888 Allergy status to other drugs, medicaments and biological substances status: Secondary | ICD-10-CM | POA: Insufficient documentation

## 2024-02-07 DIAGNOSIS — I10 Essential (primary) hypertension: Secondary | ICD-10-CM | POA: Insufficient documentation

## 2024-02-07 DIAGNOSIS — F419 Anxiety disorder, unspecified: Secondary | ICD-10-CM | POA: Diagnosis not present

## 2024-02-07 DIAGNOSIS — N281 Cyst of kidney, acquired: Secondary | ICD-10-CM | POA: Diagnosis not present

## 2024-02-07 DIAGNOSIS — I700 Atherosclerosis of aorta: Secondary | ICD-10-CM | POA: Diagnosis not present

## 2024-02-07 DIAGNOSIS — I252 Old myocardial infarction: Secondary | ICD-10-CM | POA: Insufficient documentation

## 2024-02-07 DIAGNOSIS — Z87440 Personal history of urinary (tract) infections: Secondary | ICD-10-CM | POA: Diagnosis not present

## 2024-02-07 DIAGNOSIS — I3139 Other pericardial effusion (noninflammatory): Secondary | ICD-10-CM | POA: Diagnosis not present

## 2024-02-07 DIAGNOSIS — E039 Hypothyroidism, unspecified: Secondary | ICD-10-CM | POA: Insufficient documentation

## 2024-02-07 DIAGNOSIS — C3412 Malignant neoplasm of upper lobe, left bronchus or lung: Secondary | ICD-10-CM | POA: Insufficient documentation

## 2024-02-07 NOTE — Progress Notes (Signed)
 [facility] [phone]   [phone]  PROGRESS NOTE FOR TELEMEDICINE VISITS  [name], MD [address] [place] [address]  I connected withNAME@ on 02/07/24 at 11:30 AM EDT by video enabled telemedicine visit and verified that I am speaking with the correct person using two identifiers.   I discussed the limitations, risks, security and privacy concerns of performing an evaluation and management service by telemedicine and the availability of in-person appointments. I also discussed with the patient that there may be a patient responsible charge related to this service. The patient expressed understanding and agreed to proceed.  Other persons participating in the visit and their role in the encounter:  None  Patient's location:  Home Provider's location: [facility]  DIAGNOSIS: Stage IV (T2 a, N2, M1b) non-small cell lung cancer, adenocarcinoma presented with left upper lobe lung mass in addition to left hilar and precarinal metastatic adenopathy and small left lower lobe pulmonary nodule in addition to solitary left frontal brain metastasis diagnosed in July 2022.   Molecular studies by [employer] showed  Positive KRAS G12C mutation PD-L1 expression was less than 1%   PRIOR THERAPY: 1) Status post SRS to the solitary brain metastasis. 2) Treatment for the locally advanced disease in the chest with carboplatin for AUC of 2 and paclitaxel 45 Mg/M2.  Status post 6 cycles.  Last dose was given 07/25/2021. 3) Systemic chemotherapy with carboplatin for AUC of 5, Alimta 500 Mg/M2 and Keytruda 200 Mg IV every 3 weeks.  First dose August 31, 2021.  Status post 33 cycles.  Starting from cycle #5 the patient will be on maintenance treatment with Alimta and Keytruda every 3 weeks.  Starting from cycle #30 she will be on single agent Keytruda 200 Mg IV every 3 weeks.  Alimta was discontinued secondary to intolerance.    CURRENT THERAPY: Observation   INTERVAL HISTORY: Jill [name] 73 y.o. female has a MyChart virtual video visit with me today for evaluation and discussion of her scan results.Discussed the use of AI scribe software for clinical note transcription with the patient, who gave verbal consent to proceed.  History of Present Illness   Jill [name] is a 73 year old female who presents for follow-up.  She has a history of left pleural effusion, with the last drainage procedure occurring prior to December. A recent scan performed ten days ago showed a complex left pleural effusion with heterogeneous appearance. She denies hemoptysis and reports that her cough produces a lot of mucus.  She continues to experience symptoms of bronchitis, including persistent coughing and expectoration of mucus. No fever or chills are present.  She reports gaining weight and having her appetite back since her last visit three months ago. She is not currently on antibiotics and questions the need for them due to the 'junk' in her lungs.       MEDICAL HISTORY: Past Medical History:  Diagnosis Date   Anxiety    ON PAXIL, XANAX   AVM (arteriovenous malformation) brain    s/p stent/coil   Blood transfusion    x 2   Cancer (HCC)    Left lung   COPD (chronic obstructive pulmonary disease) (HCC)    no inhaler, no oxygen   Coronary artery disease    Prior inferior MI with stent to RCA, s/p CABG in 2008   Depression    Dizziness    Dyspnea    with exertion, no oxygen  Dyspnea on exertion 10/02/2023   Fatigue    Foot injury 06/01/2017   right - RESOLVED, no longer an issue per patient 05/18/21   GERD (gastroesophageal reflux disease)    Headache(784.0)    UNRUPTURED CEREBRAL ANEURYSM   Hyperlipidemia    Hypertension    Hypothyroidism    Infected cyst of skin 09/18/2013   Left knee pain 06/05/2012   Lung mass    Left lung   Myocardial infarction [facility])    Normal nuclear stress test Ju;y 2012   No  ischemia. EF 70%; fixed defect involving septum, inferoseptal and inferior wall   Pain, dental 02/06/2017   Poor dental hygiene    Retroperitoneal bleeding    Following cardiac cath   Tobacco abuse    Urine discoloration 09/18/2013   Urine incontinence 07/06/2020   UTI (lower urinary tract infection) 10/16/2013    ALLERGIES:  is allergic to varenicline, ace inhibitors, prednisone, betalin 12 [vitamin b12], penicillins, and sulfa drugs cross reactors.  MEDICATIONS:  Current Outpatient Medications  Medication Sig Dispense Refill   acetaminophen (TYLENOL) 500 MG tablet Take 500-1,000 mg by mouth every 6 (six) hours as needed for moderate pain (pain score 4-6).     albuterol (PROVENTIL) (2.5 MG/3ML) 0.083% nebulizer solution Take 3 mLs (2.5 mg total) by nebulization every 6 (six) hours as needed for wheezing or shortness of breath. 75 mL 12   albuterol (VENTOLIN HFA) 108 (90 Base) MCG/ACT inhaler Inhale 2 puffs into the lungs every 6 (six) hours as needed for wheezing or shortness of breath. 8 g 2   amLODipine (NORVASC) 10 MG tablet TAKE 1 TABLET BY MOUTH ONCE DAILY . APPOINTMENT REQUIRED FOR FUTURE REFILLS (Patient not taking: Reported on 02/04/2024) 90 tablet 1   aspirin EC 81 MG tablet Take 1 tablet (81 mg total) by mouth daily. 90 tablet 2   atorvastatin (LIPITOR) 40 MG tablet TAKE 1 TABLET BY MOUTH EVERY DAY 90 tablet 1   azithromycin (ZITHROMAX Z-PAK) 250 MG tablet Take 2 tablet today and then 1 tablets daily x 4 days 6 each 0   budeson-glycopyrrolate-formoterol (BREZTRI AEROSPHERE) 160-9-4.8 MCG/ACT AERO Inhale 2 puffs into the lungs in the morning and at bedtime. 10.7 g 2   Cholecalciferol (VITAMIN D3 PO) Take 1 tablet by mouth daily.     clonazePAM (KLONOPIN) 0.5 MG tablet Take 1 tablet (0.5 mg total) by mouth 2 (two) times daily as needed for anxiety. 60 tablet 1   dronabinol (MARINOL) 2.5 MG capsule Take 1 capsule (2.5 mg total) by mouth 2 (two) times daily before a meal. 60 capsule 0    escitalopram (LEXAPRO) 10 MG tablet TAKE 1 TABLET BY MOUTH EVERY DAY 90 tablet 1   ferrous sulfate 324 MG TBEC Take 1 tablet (324 mg total) by mouth daily with breakfast. 90 tablet 1   folic acid (FOLVITE) 1 MG tablet TAKE 1 TABLET BY MOUTH EVERY DAY 90 tablet 0   lactose free nutrition (BOOST PLUS) LIQD Take 237 mLs by mouth 2 (two) times daily between meals. 42660 mL 2   levothyroxine (SYNTHROID) 100 MCG tablet Take 1 tablet (100 mcg total) by mouth every morning. 30 minutes before food 90 tablet 0   lidocaine-prilocaine (EMLA) cream Apply 1 Application topically as needed. 30 g 2   losartan (COZAAR) 100 MG tablet TAKE 1 TABLET BY MOUTH EVERY DAY (Patient not taking: Reported on 02/04/2024) 90 tablet 1   metoprolol succinate (TOPROL-XL) 50 MG 24 hr tablet TAKE 1 TABLET BY  MOUTH ONCE DAILY WITH MEALS OR IMMEDIATLEY AFTER A MEAL 90 tablet 1   Multiple Vitamins-Minerals (MULTIVITAMIN WITH MINERALS) tablet Take 1 tablet by mouth daily.     nitroGLYCERIN (NITROSTAT) 0.4 MG SL tablet Place 1 tablet (0.4 mg total) under the tongue every 5 (five) minutes as needed. For chest pain 25 tablet 6   prochlorperazine (COMPAZINE) 10 MG tablet Take 1 tablet (10 mg total) by mouth every 6 (six) hours as needed for nausea or vomiting. 30 tablet 0   senna ([employer] SENNA) 8.6 MG tablet TAKE 1 TABLET (8.6 MG TOTAL) BY MOUTH 2 (TWO) TIMES DAILY AS NEEDED FOR MILD CONSTIPATION. 60 tablet 1   VITAMIN A PO Take 1 tablet by mouth daily.     No current facility-administered medications for this visit.    SURGICAL HISTORY:  Past Surgical History:  Procedure Laterality Date   CARDIAC CATHETERIZATION  01/02/2007   IT REVEALS MILD INFERIOR WALL HYPOKINESIS. THE EJECTION FRACTION IS AROUND 50%   COLONOSCOPY     CORONARY ARTERY BYPASS GRAFT  11/06/2006   LIMA to LAD, SVG to DX, SVG to LCX & SVG to OM 1 & 2, and SVG to PD   CORONARY STENT PLACEMENT     Remote past stent to RCA   EYE SURGERY Right    cataracts removed    HEMORRHOID SURGERY  11/07/1987   IR IMAGING GUIDED PORT INSERTION  10/04/2022   UPPER GI ENDOSCOPY     VENTRICULOSTOMY  10/06/2011   Procedure: VENTRICULOSTOMY;  Surgeon: Carmela Hurt;  Location: [facility];  Service: Neurosurgery;  Laterality: Right;  Insertion of Ventriculostomy Catheter   VIDEO BRONCHOSCOPY WITH ENDOBRONCHIAL NAVIGATION Left 05/20/2021   Procedure: VIDEO BRONCHOSCOPY WITH ENDOBRONCHIAL NAVIGATION;  Surgeon: Josephine Igo, DO;  Location: [facility] OR;  Service: Pulmonary;  Laterality: Left;   VIDEO BRONCHOSCOPY WITH ENDOBRONCHIAL ULTRASOUND Bilateral 05/20/2021   Procedure: VIDEO BRONCHOSCOPY WITH ENDOBRONCHIAL ULTRASOUND;  Surgeon: Josephine Igo, DO;  Location: [facility] OR;  Service: Pulmonary;  Laterality: Bilateral;   WISDOM TOOTH EXTRACTION      REVIEW OF SYSTEMS:  Constitutional: positive for fatigue Eyes: negative Ears, nose, mouth, throat, and face: negative Respiratory: positive for cough and dyspnea on exertion Cardiovascular: negative Gastrointestinal: negative Genitourinary:negative Integument/breast: negative Hematologic/lymphatic: negative Musculoskeletal:negative Neurological: negative Behavioral/Psych: negative Endocrine: negative Allergic/Immunologic: negative   LABORATORY DATA: Lab Results  Component Value Date   WBC 7.4 01/25/2024   HGB 11.9 (L) 01/25/2024   HCT 36.8 01/25/2024   MCV 94.4 01/25/2024   PLT 377 01/25/2024      Chemistry      Component Value Date/Time   NA 139 01/25/2024 1016   NA 141 04/12/2021 1356   K 3.8 01/25/2024 1016   CL 102 01/25/2024 1016   CO2 30 01/25/2024 1016   BUN 10 01/25/2024 1016   BUN 13 04/12/2021 1356   CREATININE 0.74 01/25/2024 1016   CREATININE 0.99 08/29/2016 1130      Component Value Date/Time   CALCIUM 9.3 01/25/2024 1016   ALKPHOS 90 01/25/2024 1016   AST 17 01/25/2024 1016   ALT 10 01/25/2024 1016   BILITOT 0.4 01/25/2024 1016       RADIOGRAPHIC STUDIES: CT CHEST ABDOMEN PELVIS W  CONTRAST Result Date: 02/06/2024 CLINICAL DATA:  Primary non-small cell left lung cancer with brain metastases. Previous XRT and chemotherapy. * Tracking Code: BO *. EXAM: CT CHEST, ABDOMEN, AND PELVIS WITH CONTRAST TECHNIQUE: Multidetector CT imaging of the chest, abdomen and pelvis was  performed following the standard protocol during bolus administration of intravenous contrast. RADIATION DOSE REDUCTION: This exam was performed according to the departmental dose-optimization program which includes automated exposure control, adjustment of the mA and/or kV according to patient size and/or use of iterative reconstruction technique. CONTRAST:  OMNIPAQUE IOHEXOL 300 MG/ML  SOLN COMPARISON:  CT 10/25/2023. older exams as well. FINDINGS: CT CHEST FINDINGS Cardiovascular: Status post median sternotomy. Right IJ chest port in place. The port is accessed. Tip of the catheter extends to the SVC right atrial junction region. There is an atretic appearing right internal jugular vein centrally. Unchanged from previous. Heart is nonenlarged. Small pericardial effusion is similar. Coronary artery calcifications are seen. The thoracic aorta has moderate partially calcified atherosclerotic plaque. There is a bovine type aortic arch, a normal variant. Mediastinum/Nodes: No specific abnormal lymph node enlargement identified in the axillary region, hilum or mediastinum. Atrophic thyroid gland. Slightly patulous thoracic esophagus with small hiatal hernia. Lungs/Pleura: Advanced emphysematous lung changes identified. The right lung is without consolidation, pneumothorax or effusion. Some areas bronchial opacity identified such as anteriorly on series 4, image 53, possible mucous plugging. Punctate calcified nodule right lower lobe on series 4, image 120 consistent with old granulomatous disease. Few tree-in-bud like areas along the dependent right lower lobe as well, unchanged from previous. Stable 6 mm semi-solid cystic middle  lobe nodule on series 4, image 97. Left lung has volume loss with consolidation with bronchiectasis and distortion along the lingula. Extent distribution is similar to previous. Also some adjacent areas of pleural thickening including extending back to the minor fissure. The central nodular area measured on the previous examination in this location at 2.6 x 2.3 cm is difficult to define on today's examination with surrounding opacity. Estimated size on today study using the same technique would be a proximally 2.7 x 2.3 cm on series 4, image 61. Previously there is a complex pleural effusion at the left lung base with pleural thickening. The size is similar to the previous on today's examination but today the fluid is much more complex with higher density areas. There are lobular areas with Hounsfield units of 48 today throughout this area. This could be high density debris versus soft tissue. Hemorrhage is also a possibility. Recommend further evaluation and correlation with specific history. Musculoskeletal: Osteopenia. Mild degenerative changes along the spine. CT ABDOMEN PELVIS FINDINGS Hepatobiliary: No focal liver abnormality is seen. No gallstones, gallbladder wall thickening, or biliary dilatation. Stable tiny benign-appearing cystic foci. Pancreas: Preserved pancreatic parenchyma. Slight ectasia of the pancreatic duct with what appears to be a variant of a pancreatic divisum, unchanged from previous. No obvious mass. Spleen: Normal in size without focal abnormality. Adrenals/Urinary Tract: Adrenal glands are preserved. No enhancing renal mass or collecting system dilatation. Multiple Bosniak 1 and 2 bilateral renal cysts are again identified unchanged from previous. The ureters have normal course and caliber extending down to the urinary bladder. Preserved contour to the urinary bladder. Stomach/Bowel: Stomach is collapsed. Fundal posterior gastric diverticulum. Small bowel has a normal course and caliber.  No oral contrast was provided. Large bowel is nondilated but has a redundant course diffusely. Moderate scattered colonic stool. Normal appendix in the right lower quadrant. Vascular/Lymphatic: Severe atherosclerotic calcified plaque identified along the aorta and branch vessels. Areas of stenosis suggested along the common iliac vessels. Please correlate with any symptoms. Normal caliber IVC. No discrete abnormal lymph node enlargement identified in the abdomen and pelvis. Reproductive: Uterus and bilateral adnexa are unremarkable. Other:  No free intra-abdominal air or free fluid. Musculoskeletal: Injection granulomas about the pelvis on the left side in subcutaneous fat posteriorly. Degenerative changes identified along the spine and pelvis. Multilevel disc bulging along the lumbar spine. Osteopenia. IMPRESSION: Stable bandlike opacity in left upper lobe with nodularity. Complex left pleural effusion previous today is more heterogeneous in appearance with more soft tissue density. Size of the areas similar. This would have a differential including mass lesion versus hemorrhage versus other. Recommend correlation any known history such as a previous intervention. Additional workup could be considered as well with either sampling or PET-CT as well as clinically appropriate. No developing new other mass lesion, nodal enlargement or fluid collection. Advanced emphysema.  Extensive atherosclerotic changes. Electronically Signed   By: Karen Kays M.D.   On: 02/06/2024 15:52    ASSESSMENT AND PLAN: This is a very pleasant 73 years old white female diagnosed with a stage IV (T2 a, N2, M1 B) non-small cell lung cancer, adenocarcinoma presented with left upper lobe lung mass in addition to left hilar and precarinal metastatic adenopathy as well as small left lower lobe pulmonary nodule and solitary left frontal brain metastasis diagnosed in July 2022.  Molecular studies were positive for KRAS G12C mutation and PD-L1  expression was negative. The patient is status post SRS to the solitary brain metastasis. She underwent a course of concurrent chemoradiation to the locally advanced disease in the chest with carboplatin for AUC of 2 and paclitaxel 45 Mg/M2 status post 7 cycles.  The patient has been tolerating her treatment well with no concerning adverse effects. Her scan showed mild improvement of her disease with no concerning findings for progression. Technically the patient has a stage IV lung cancer with brain metastasis  The patient is currently undergoing systemic chemotherapy with carboplatin for AUC of 5, Alimta 500 Mg/M2 and Keytruda 200 Mg IV every 3 weeks status post 33 cycles.  Starting from cycle #5 the patient will be on maintenance treatment with Alimta and Keytruda every 3 weeks.  Starting from cycle #30 she will be on treatment with single agent Keytruda every 3 weeks.  Alimta was discontinued secondary to increasing fatigue and weakness and intolerance.  She is currently on observation.     Stage IV (T2 a, N2, M1b) non-small cell lung cancer, adenocarcinoma presented with left upper lobe lung mass in addition to left hilar and precarinal metastatic adenopathy and small left lower lobe pulmonary nodule in addition to solitary left frontal brain metastasis diagnosed in July 2022. She underwent Systemic chemotherapy with carboplatin for AUC of 5, Alimta 500 Mg/M2 and Keytruda 200 Mg IV every 3 weeks.  First dose August 31, 2021.  Status post 33 cycles.  Starting from cycle #5 the patient will be on maintenance treatment with Alimta and Keytruda every 3 weeks.  Starting from cycle #30 she will be on single agent Keytruda 200 Mg IV every 3 weeks.  Alimta was discontinued secondary to intolerance.   She is currently on observation and feeling fine except for the cough.  She had repeat CT scan of the chest, abdomen and pelvis performed recently that showed no evidence for disease progression except for the  complex left pleural effusion with heterogeneous appearance with possible hemorrhage or mass lesion.   Complex left pleural effusion She has a complex left pleural effusion with a heterogeneous appearance on recent imaging. There is concern for possible hemorrhage or mass lesion, but no definitive evidence of cancer growth or new mass lesions.  The effusion may be related to a previous procedure, and the absence of hemoptysis reduces the likelihood of active hemorrhage. Observation is deemed appropriate given the current lack of symptoms. - Schedule follow-up scan in three months - Advise her to report any worsening symptoms such as dyspnea  Cancer surveillance She is under surveillance for cancer recurrence. Recent imaging shows no new mass lesions or fluid collections, indicating no disease progression. - Continue cancer surveillance with regular imaging  Chronic bronchitis She reports persistent cough with mucus production, consistent with chronic bronchitis. There is no indication of bacterial infection, as she does not have fever or chills. The cough may be due to inflammation, possibly exacerbated by environmental factors such as pollen. Antibiotics are not indicated at this time. - Advise her to contact primary care provider if fever or chills develop    I discussed the assessment and treatment plan with the patient. The patient was provided an opportunity to ask questions and all were answered. The patient agreed with the plan and demonstrated an understanding of the instructions.   The patient was advised to call back or seek an in-person evaluation if the symptoms worsen or if the condition fails to improve as anticipated.  I provided 30 minutes of face-to-face video visit time during this encounter, and > 50% was spent counseling as documented under my assessment & plan.  Lajuana Matte, MD 02/07/2024 11:25 AM  Disclaimer: This note was dictated with voice recognition software.  Similar sounding words can inadvertently be transcribed and may not be corrected upon review.

## 2024-02-20 ENCOUNTER — Other Ambulatory Visit (HOSPITAL_COMMUNITY): Payer: Self-pay

## 2024-02-20 ENCOUNTER — Other Ambulatory Visit: Payer: Self-pay | Admitting: Family Medicine

## 2024-02-26 ENCOUNTER — Ambulatory Visit: Admitting: Family Medicine

## 2024-03-07 ENCOUNTER — Other Ambulatory Visit: Payer: Self-pay | Admitting: Family Medicine

## 2024-03-07 DIAGNOSIS — J441 Chronic obstructive pulmonary disease with (acute) exacerbation: Secondary | ICD-10-CM

## 2024-03-07 MED ORDER — BREZTRI AEROSPHERE 160-9-4.8 MCG/ACT IN AERO
2.0000 | INHALATION_SPRAY | Freq: Two times a day (BID) | RESPIRATORY_TRACT | 2 refills | Status: AC
Start: 2024-03-07 — End: ?

## 2024-03-07 NOTE — Progress Notes (Signed)
 Pharmacy Medication Assistance Program Note    03/07/2024  Patient ID: Jill [name], female   DOB: November 18, 1950, 73 y.o.   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   Please send a 3 month RX with additional refills. Previous fill complete. Company fills 3 month supplies at a time.  Qty 32.1grams (3 inhalers) with refills.

## 2024-03-10 ENCOUNTER — Other Ambulatory Visit: Payer: Self-pay | Admitting: Family Medicine

## 2024-03-10 ENCOUNTER — Encounter: Payer: Self-pay | Admitting: Family Medicine

## 2024-03-10 ENCOUNTER — Ambulatory Visit (INDEPENDENT_AMBULATORY_CARE_PROVIDER_SITE_OTHER): Admitting: Family Medicine

## 2024-03-10 VITALS — BP 124/73 | HR 78 | Ht 66.0 in | Wt 117.6 lb

## 2024-03-10 DIAGNOSIS — E43 Unspecified severe protein-calorie malnutrition: Secondary | ICD-10-CM | POA: Diagnosis not present

## 2024-03-10 DIAGNOSIS — I10 Essential (primary) hypertension: Secondary | ICD-10-CM

## 2024-03-10 DIAGNOSIS — E039 Hypothyroidism, unspecified: Secondary | ICD-10-CM | POA: Diagnosis not present

## 2024-03-10 MED ORDER — BOOST PLUS PO LIQD: 237.0000 mL | Freq: Two times a day (BID) | ORAL | 2 refills | Status: AC

## 2024-03-10 NOTE — Assessment & Plan Note (Addendum)
 Secondary to stage IV Lung cancer with brain metastasis I will escribe Boost for insurance coverage

## 2024-03-10 NOTE — Assessment & Plan Note (Addendum)
 D/C Losartan  100 mg Continue Norvasc  and Metoprolol  Monitor BP closely

## 2024-03-10 NOTE — Progress Notes (Signed)
    SUBJECTIVE:   CHIEF COMPLAINT / HPI:  HTN:  Here for f/u. She was taken off Norvasc  and Losartan  100 mg QD due to Hypotention. However, 2 weeks ago, she self-resumed her Norvasc  10 mg due to home high BP readings with SBP mostly in the 150s. She is also on Metoprolol  XL 50 mg QD.  Hypothyroidism: Compliant with Synthroid  100 mcg every day.  Weight management: She can't keep her weight up. Her oncologist started her on Marinol . However, she was unable to tolerate this medication. She is unable to afford her Boost OTC.  Health maintenance: Not had breast cancer screening and colon cancer screening. Need bone scan.  PERTINENT  PMH / PSH: PMHx reviewed  OBJECTIVE:   BP 125/60   Pulse 75   Ht 5\' 6"  (1.676 m)   Wt 117 lb 9.6 oz (53.3 kg)   SpO2 90%   BMI 18.98 kg/m   Physical Exam Vitals and nursing note reviewed.  Cardiovascular:     Rate and Rhythm: Normal rate and regular rhythm.     Heart sounds: Normal heart sounds. No murmur heard. Pulmonary:     Effort: Pulmonary effort is normal. No respiratory distress.     Breath sounds: Normal breath sounds. No wheezing.      ASSESSMENT/PLAN:   Assessment & Plan Hypothyroidism, unspecified type TSH checked today I will contact her soon with her test results Primary hypertension D/C Losartan  100 mg Continue Norvasc  and Metoprolol  Monitor BP closely Severe malnutrition (HCC) Secondary to stage IV Lung cancer with brain metastasis I will escribe Boost for insurance coverage   She does not want any more screening. No mammogram, colon cancer screen or Dexa. She said she has stage IV lung cancer and does not want to know if she has additional cancer. Will d/c all health maintenance test per the patient.  Return for COVID and Tdap - out of tdap today.   Penni Bowman, MD [facility]

## 2024-03-10 NOTE — Patient Instructions (Signed)
 How to Take Your Blood Pressure Blood pressure measures how strongly your blood is pressing against the walls of your arteries. Arteries are blood vessels that carry blood from your heart throughout your body. You can take your blood pressure at home with a machine. You may need to check your blood pressure at home: To check if you have high blood pressure (hypertension). To check your blood pressure over time. To make sure your blood pressure medicine is working. Supplies needed: Blood pressure machine, or monitor. A chair to sit in. This should be a chair where you can sit upright with your back supported. Do not sit on a soft couch or an armchair. Table or desk. Small notebook. Pencil or pen. How to prepare Avoid these things for 30 minutes before checking your blood pressure: Having drinks with caffeine in them, such as coffee or tea. Drinking alcohol. Eating. Smoking. Exercising. Do these things five minutes before checking your blood pressure: Go to the bathroom and pee (urinate). Sit in a chair. Be quiet. Do not talk. How to take your blood pressure Follow the instructions that came with your machine. If you have a digital blood pressure monitor, these may be the instructions: Sit up straight. Place your feet on the floor. Do not cross your ankles or legs. Rest your left arm at the level of your heart. You may rest it on a table, desk, or chair. Pull up your shirt sleeve. Wrap the blood pressure cuff around the upper part of your left arm. The cuff should be 1 inch (2.5 cm) above your elbow. It is best to wrap the cuff around bare skin. Fit the cuff snugly around your arm, but not too tightly. You should be able to place only one finger between the cuff and your arm. Place the cord so that it rests in the bend of your elbow. Press the power button. Sit quietly while the cuff fills with air and loses air. Write down the numbers on the screen. Wait 2-3 minutes and then repeat  steps 1-10. What do the numbers mean? Two numbers make up your blood pressure. The first number is called systolic pressure. The second is called diastolic pressure. An example of a blood pressure reading is "120 over 80" (or 120/80). If you are an adult and do not have a medical condition, use this guide to find out if your blood pressure is normal: Normal First number: below 120. Second number: below 80. Elevated First number: 120-129. Second number: below 80. Hypertension stage 1 First number: 130-139. Second number: 80-89. Hypertension stage 2 First number: 140 or above. Second number: 90 or above. Your blood pressure is above normal even if only the first or only the second number is above normal. Follow these instructions at home: Medicines Take over-the-counter and prescription medicines only as told by your doctor. Tell your doctor if your medicine is causing side effects. General instructions Check your blood pressure as often as your doctor tells you to. Check your blood pressure at the same time every day. Take your monitor to your next doctor's appointment. Your doctor will: Make sure you are using it correctly. Make sure it is working right. Understand what your blood pressure numbers should be. Keep all follow-up visits. General tips You will need a blood pressure machine or monitor. Your doctor can suggest a monitor. You can buy one at a drugstore or online. When choosing one: Choose one with an arm cuff. Choose one that wraps around your  upper arm. Only one finger should fit between your arm and the cuff. Do not choose one that measures your blood pressure from your wrist or finger. Where to find more information American Heart Association: www.heart.org Contact a doctor if: Your blood pressure keeps being high. Your blood pressure is suddenly low. Get help right away if: Your first blood pressure number is higher than 180. Your second blood pressure number is  higher than 120. These symptoms may be an emergency. Do not wait to see if the symptoms will go away. Get help right away. Call 911. Summary Check your blood pressure at the same time every day. Avoid caffeine, alcohol, smoking, and exercise for 30 minutes before checking your blood pressure. Make sure you understand what your blood pressure numbers should be. This information is not intended to replace advice given to you by your health care provider. Make sure you discuss any questions you have with your health care provider. Document Revised: 07/07/2021 Document Reviewed: 07/07/2021 Elsevier Patient Education  2024 [employer].

## 2024-03-10 NOTE — Assessment & Plan Note (Signed)
 TSH checked today I will contact her soon with her test results

## 2024-03-11 ENCOUNTER — Other Ambulatory Visit: Payer: Self-pay | Admitting: Family Medicine

## 2024-03-11 ENCOUNTER — Encounter: Payer: Self-pay | Admitting: Family Medicine

## 2024-03-11 LAB — TSH: TSH: 0.566 u[IU]/mL (ref 0.450–4.500)

## 2024-03-11 MED ORDER — CLONAZEPAM 0.5 MG PO TABS: 0.5000 mg | ORAL_TABLET | Freq: Two times a day (BID) | ORAL | 1 refills | Status: DC | PRN

## 2024-04-02 ENCOUNTER — Other Ambulatory Visit: Payer: Self-pay | Admitting: Family Medicine

## 2024-04-02 DIAGNOSIS — E785 Hyperlipidemia, unspecified: Secondary | ICD-10-CM

## 2024-04-08 ENCOUNTER — Ambulatory Visit: Admitting: Family Medicine

## 2024-04-09 ENCOUNTER — Other Ambulatory Visit: Payer: Self-pay | Admitting: Student

## 2024-04-15 ENCOUNTER — Other Ambulatory Visit: Payer: Medicare Other

## 2024-04-23 ENCOUNTER — Ambulatory Visit: Payer: Medicare HMO | Admitting: Urology

## 2024-04-28 ENCOUNTER — Telehealth: Payer: Self-pay

## 2024-04-28 NOTE — Telephone Encounter (Signed)
 Spoke with patient regarding CT scan scheduled for tomorrow and inquired if she would need a port flush with lab appointment. Informed patient that her last labs were completed in March; therefore, a port flush is required with labs prior to the scan. Port flush and lab appointment scheduled for tomorrow at 2:30 PM. Patient voiced understanding and expressed thanks.

## 2024-04-29 ENCOUNTER — Ambulatory Visit (HOSPITAL_COMMUNITY)
Admission: RE | Admit: 2024-04-29 | Discharge: 2024-04-29 | Disposition: A | Discharge status: Home / Self Care | Source: Ambulatory Visit | Attending: Internal Medicine | Admitting: Internal Medicine

## 2024-04-29 ENCOUNTER — Other Ambulatory Visit: Payer: Self-pay | Admitting: Internal Medicine

## 2024-04-29 ENCOUNTER — Inpatient Hospital Stay: Attending: Internal Medicine

## 2024-04-29 DIAGNOSIS — R918 Other nonspecific abnormal finding of lung field: Secondary | ICD-10-CM | POA: Diagnosis not present

## 2024-04-29 DIAGNOSIS — J439 Emphysema, unspecified: Secondary | ICD-10-CM | POA: Diagnosis not present

## 2024-04-29 DIAGNOSIS — J8410 Pulmonary fibrosis, unspecified: Secondary | ICD-10-CM | POA: Diagnosis not present

## 2024-04-29 DIAGNOSIS — C3490 Malignant neoplasm of unspecified part of unspecified bronchus or lung: Secondary | ICD-10-CM | POA: Insufficient documentation

## 2024-04-29 DIAGNOSIS — Z95828 Presence of other vascular implants and grafts: Secondary | ICD-10-CM

## 2024-04-29 DIAGNOSIS — J90 Pleural effusion, not elsewhere classified: Secondary | ICD-10-CM | POA: Diagnosis not present

## 2024-04-29 DIAGNOSIS — I700 Atherosclerosis of aorta: Secondary | ICD-10-CM | POA: Diagnosis not present

## 2024-04-29 DIAGNOSIS — Z79899 Other long term (current) drug therapy: Secondary | ICD-10-CM | POA: Insufficient documentation

## 2024-04-29 DIAGNOSIS — C3412 Malignant neoplasm of upper lobe, left bronchus or lung: Secondary | ICD-10-CM | POA: Insufficient documentation

## 2024-04-29 DIAGNOSIS — I2510 Atherosclerotic heart disease of native coronary artery without angina pectoris: Secondary | ICD-10-CM | POA: Insufficient documentation

## 2024-04-29 DIAGNOSIS — K838 Other specified diseases of biliary tract: Secondary | ICD-10-CM | POA: Diagnosis not present

## 2024-04-29 LAB — CBC WITH DIFFERENTIAL (CANCER CENTER ONLY)
Abs Immature Granulocytes: 0.02 10*3/uL (ref 0.00–0.07)
Basophils Absolute: 0.1 10*3/uL (ref 0.0–0.1)
Basophils Relative: 1 %
Eosinophils Absolute: 0.2 10*3/uL (ref 0.0–0.5)
Eosinophils Relative: 3 %
HCT: 36.8 % (ref 36.0–46.0)
Hemoglobin: 12 g/dL (ref 12.0–15.0)
Immature Granulocytes: 0 %
Lymphocytes Relative: 19 %
Lymphs Abs: 1.4 10*3/uL (ref 0.7–4.0)
MCH: 30.9 pg (ref 26.0–34.0)
MCHC: 32.6 g/dL (ref 30.0–36.0)
MCV: 94.8 fL (ref 80.0–100.0)
Monocytes Absolute: 0.7 10*3/uL (ref 0.1–1.0)
Monocytes Relative: 10 %
Neutro Abs: 5 10*3/uL (ref 1.7–7.7)
Neutrophils Relative %: 67 %
Platelet Count: 301 10*3/uL (ref 150–400)
RBC: 3.88 MIL/uL (ref 3.87–5.11)
RDW: 14.8 % (ref 11.5–15.5)
WBC Count: 7.3 10*3/uL (ref 4.0–10.5)
nRBC: 0 % (ref 0.0–0.2)

## 2024-04-29 LAB — CMP (CANCER CENTER ONLY)
ALT: 8 U/L (ref 0–44)
AST: 14 U/L — ABNORMAL LOW (ref 15–41)
Albumin: 3.5 g/dL (ref 3.5–5.0)
Alkaline Phosphatase: 91 U/L (ref 38–126)
Anion gap: 7 (ref 5–15)
BUN: 11 mg/dL (ref 8–23)
CO2: 32 mmol/L (ref 22–32)
Calcium: 9.3 mg/dL (ref 8.9–10.3)
Chloride: 100 mmol/L (ref 98–111)
Creatinine: 0.97 mg/dL (ref 0.44–1.00)
GFR, Estimated: >60 mL/min (ref >60)
Glucose, Bld: 147 mg/dL — ABNORMAL HIGH (ref 70–99)
Potassium: 3.6 mmol/L (ref 3.5–5.1)
Sodium: 139 mmol/L (ref 135–145)
Total Bilirubin: 0.4 mg/dL (ref 0.0–1.2)
Total Protein: 7 g/dL (ref 6.5–8.1)

## 2024-04-29 MED ORDER — IOHEXOL 9 MG/ML PO SOLN
ORAL | Status: AC
  Filled 2024-04-29: qty 1000

## 2024-04-29 MED ORDER — IOHEXOL 9 MG/ML PO SOLN
500.0000 mL | ORAL | Status: AC
  Administered 2024-04-29 (×2): 500 mL via ORAL

## 2024-04-29 MED ORDER — SODIUM CHLORIDE 0.9% FLUSH
10.0000 mL | Freq: Once | INTRAVENOUS | Status: AC
  Administered 2024-04-29: 10 mL

## 2024-04-29 MED ORDER — HEPARIN SOD (PORK) LOCK FLUSH 100 UNIT/ML IV SOLN
INTRAVENOUS | Status: AC
Start: 2024-04-29 — End: 2024-04-29
  Filled 2024-04-29: qty 5

## 2024-04-29 MED ORDER — IOHEXOL 300 MG/ML  SOLN
100.0000 mL | Freq: Once | INTRAMUSCULAR | Status: AC | PRN
  Administered 2024-04-29: 100 mL via INTRAVENOUS

## 2024-04-29 MED ORDER — HEPARIN SOD (PORK) LOCK FLUSH 100 UNIT/ML IV SOLN
500.0000 [IU] | Freq: Once | INTRAVENOUS | Status: AC
  Administered 2024-04-29: 500 [IU] via INTRAVENOUS

## 2024-05-05 ENCOUNTER — Other Ambulatory Visit: Payer: Self-pay | Admitting: Family Medicine

## 2024-05-05 ENCOUNTER — Other Ambulatory Visit: Payer: Self-pay | Admitting: Internal Medicine

## 2024-05-05 DIAGNOSIS — I10 Essential (primary) hypertension: Secondary | ICD-10-CM

## 2024-05-07 NOTE — Progress Notes (Incomplete)
 Telephone nursing appointment for review of most recent MRI-Brain results. I verified patient's identity x2 and began nursing interview.   Patient states issues as follows...   -Fatigue: *** -Hair Loss: *** -Skin: *** -Weakness: *** -Loss of control of extremities: *** -Headache: *** -Seizure/ uncontrolled movement: *** -Vision: *** -Speech: *** -Confusion: *** -Dexamethasone / steroids: ***  Patient denies any other related issues at this time.   Meaningful use complete.   Patient aware of their telephone appointment w/ Ashlyn Bruning PA-C. I left my extension [phone] in case patient needs anything. Patient verbalized understanding. This concludes the nursing interview.   Patient preferred phone # [phone]   Rosaline Minerva, LPN

## 2024-05-08 ENCOUNTER — Inpatient Hospital Stay: Attending: Internal Medicine | Admitting: Internal Medicine

## 2024-05-08 ENCOUNTER — Other Ambulatory Visit: Payer: Self-pay | Admitting: Physician Assistant

## 2024-05-08 ENCOUNTER — Telehealth: Payer: Self-pay

## 2024-05-08 DIAGNOSIS — C3490 Malignant neoplasm of unspecified part of unspecified bronchus or lung: Secondary | ICD-10-CM

## 2024-05-08 NOTE — Telephone Encounter (Signed)
 Spoke with patient this morning regarding her CT scan results. Patient stated she usually receives a telephone call with results. Informed her that Dr. Sherrod has an opening today at 3:30 PM for a telephone office visit if she would like to discuss the results. Patient accepted the appointment, and it has been scheduled. She voiced understanding.

## 2024-05-08 NOTE — Progress Notes (Signed)
 [facility] [phone]   [phone]  PROGRESS NOTE FOR TELEMEDICINE VISITS  Anders Otto DASEN, MD [address] [place] [address]  I connected withNAME@ on 05/08/24 at  3:30 PM EDT by telephone visit and verified that I am speaking with the correct person using two identifiers.   I discussed the limitations, risks, security and privacy concerns of performing an evaluation and management service by telemedicine and the availability of in-person appointments. I also discussed with the patient that there may be a patient responsible charge related to this service. The patient expressed understanding and agreed to proceed.  Other persons participating in the visit and their role in the encounter:  None  Patient's location: Home Provider's location: [facility]  DIAGNOSIS: Stage IV (T2 a, N2, M1b) non-small cell lung cancer, adenocarcinoma presented with left upper lobe lung mass in addition to left hilar and precarinal metastatic adenopathy and small left lower lobe pulmonary nodule in addition to solitary left frontal brain metastasis diagnosed in July 2022.   Molecular studies by [employer] showed  Positive KRAS G12C mutation PD-L1 expression was less than 1%   PRIOR THERAPY: 1) Status post SRS to the solitary brain metastasis. 2) Treatment for the locally advanced disease in the chest with carboplatin  for AUC of 2 and paclitaxel  45 Mg/M2.  Status post 6 cycles.  Last dose was given 07/25/2021. 3) Systemic chemotherapy with carboplatin  for AUC of 5, Alimta  500 Mg/M2 and Keytruda  200 Mg IV every 3 weeks.  First dose August 31, 2021.  Status post 33 cycles.  Starting from cycle #5 the patient will be on maintenance treatment with Alimta  and Keytruda  every 3 weeks.  Starting from cycle #30 she will be on single agent Keytruda  200 Mg IV every 3 weeks.  Alimta  was discontinued secondary to intolerance.   CURRENT THERAPY:  Observation   INTERVAL HISTORY: KYNZI LEVAY 73 y.o. female has a telephone virtual visit with me today for evaluation and discussion of her scan results.Discussed the use of AI scribe software for clinical note transcription with the patient, who gave verbal consent to proceed.  History of Present Illness COOPER STAMP is a 73 year old female with stage four non-small cell lung cancer who presents for a virtual visit to discuss recent scan results.  Diagnosed with stage four non-small cell lung cancer, adenocarcinoma, in July 2022, with a positive KRSG12C mutation and negative PD-L1 expression. Undergoing systemic chemotherapy for approximately one year.  Recently underwent a CT scan of the chest, abdomen, and pelvis, which showed unchanged pulmonary nodules, including an 8 mm speculated nodule in the anterior right upper lobe and a 9 mm subsolid nodule in the right middle lobe. She is concerned about these findings, questioning if they are new or indicative of cancer progression.  She feels better overall, with an improved appetite and increased mobility. No new physical issues have arisen since her last visit three months ago. Her port was flushed a couple of weeks ago.    MEDICAL HISTORY: Past Medical History:  Diagnosis Date   Anxiety    ON PAXIL , XANAX    AVM (arteriovenous malformation) brain    s/p stent/coil   Blood transfusion    x 2   Cancer (HCC)    Left lung   COPD (chronic obstructive pulmonary disease) (HCC)    no inhaler, no oxygen   Coronary artery disease    Prior inferior MI with stent to RCA, s/p  CABG in 2008   Depression    Dizziness    Dyspnea    with exertion, no oxygen   Dyspnea on exertion 10/02/2023   Encounter for antineoplastic immunotherapy 08/24/2021   Fatigue    Foot injury 06/01/2017   right - RESOLVED, no longer an issue per patient 05/18/21   GERD (gastroesophageal reflux disease)    Headache(784.0)    UNRUPTURED CEREBRAL ANEURYSM    Hyperlipidemia    Hypertension    Hypothyroidism    Infected cyst of skin 09/18/2013   Left knee pain 06/05/2012   Lung mass    Left lung   Myocardial infarction [facility])    Normal nuclear stress test Ju;y 2012   No ischemia. EF 70%; fixed defect involving septum, inferoseptal and inferior wall   Pain, dental 02/06/2017   Poor dental hygiene    Retroperitoneal bleeding    Following cardiac cath   Tobacco abuse    Urine discoloration 09/18/2013   Urine incontinence 07/06/2020   UTI (lower urinary tract infection) 10/16/2013    ALLERGIES:  is allergic to varenicline, ace inhibitors, prednisone, betalin 12  [vitamin b12], penicillins, and sulfa drugs cross reactors.  MEDICATIONS:  Current Outpatient Medications  Medication Sig Dispense Refill   acetaminophen  (TYLENOL ) 500 MG tablet Take 500-1,000 mg by mouth every 6 (six) hours as needed for moderate pain (pain score 4-6). (Patient not taking: Reported on 03/10/2024)     albuterol  (PROVENTIL ) (2.5 MG/3ML) 0.083% nebulizer solution Take 3 mLs (2.5 mg total) by nebulization every 6 (six) hours as needed for wheezing or shortness of breath. (Patient not taking: Reported on 03/10/2024) 75 mL 12   albuterol  (VENTOLIN  HFA) 108 (90 Base) MCG/ACT inhaler Inhale 2 puffs into the lungs every 6 (six) hours as needed for wheezing or shortness of breath. (Patient not taking: Reported on 03/10/2024) 8 g 2   amLODipine  (NORVASC ) 10 MG tablet TAKE 1 TABLET BY MOUTH ONCE DAILY . APPOINTMENT REQUIRED FOR FUTURE REFILLS 90 tablet 1   aspirin  EC 81 MG tablet Take 1 tablet (81 mg total) by mouth daily. 90 tablet 2   atorvastatin  (LIPITOR) 40 MG tablet TAKE 1 TABLET BY MOUTH EVERY DAY 90 tablet 1   budeson-glycopyrrolate -formoterol  (BREZTRI  AEROSPHERE) 160-9-4.8 MCG/ACT AERO inhaler Inhale 2 puffs into the lungs in the morning and at bedtime. 3 each 2   Cholecalciferol (VITAMIN D3 PO) Take 1 tablet by mouth daily. (Patient not taking: Reported on 03/10/2024)      clonazePAM  (KLONOPIN ) 0.5 MG tablet Take 1 tablet (0.5 mg total) by mouth 2 (two) times daily as needed for anxiety. 60 tablet 1   escitalopram  (LEXAPRO ) 10 MG tablet TAKE 1 TABLET BY MOUTH EVERY DAY 90 tablet 1   ferrous sulfate  324 MG TBEC Take 1 tablet (324 mg total) by mouth daily with breakfast. 90 tablet 1   folic acid  (FOLVITE ) 1 MG tablet TAKE 1 TABLET BY MOUTH EVERY DAY (Patient not taking: Reported on 03/10/2024) 90 tablet 0   lactose free nutrition (BOOST PLUS) LIQD Take 237 mLs by mouth 2 (two) times daily between meals. 42660 mL 2   levothyroxine  (SYNTHROID ) 100 MCG tablet TAKE 1 TABLET (100 MCG TOTAL) BY MOUTH EVERY MORNING. 30 MINUTES BEFORE FOOD 90 tablet 1   lidocaine -prilocaine  (EMLA ) cream Apply 1 Application topically as needed. (Patient not taking: Reported on 03/10/2024) 30 g 2   metoprolol  succinate (TOPROL -XL) 50 MG 24 hr tablet TAKE 1 TABLET BY MOUTH ONCE DAILY WITH MEALS OR IMMEDIATLEY AFTER A MEAL  90 tablet 1   Multiple Vitamins-Minerals (MULTIVITAMIN WITH MINERALS) tablet Take 1 tablet by mouth daily.     nitroGLYCERIN  (NITROSTAT ) 0.4 MG SL tablet Place 1 tablet (0.4 mg total) under the tongue every 5 (five) minutes as needed. For chest pain (Patient not taking: Reported on 03/10/2024) 25 tablet 6   prochlorperazine  (COMPAZINE ) 10 MG tablet TAKE 1 TABLET BY MOUTH EVERY 6 HOURS AS NEEDED NAUSEA AND VOMITING 30 tablet 0   senna ([employer] SENNA) 8.6 MG tablet TAKE 1 TABLET (8.6 MG TOTAL) BY MOUTH 2 (TWO) TIMES DAILY AS NEEDED FOR MILD CONSTIPATION. (Patient not taking: Reported on 03/10/2024) 60 tablet 1   VITAMIN A PO Take 1 tablet by mouth daily.     No current facility-administered medications for this visit.    SURGICAL HISTORY:  Past Surgical History:  Procedure Laterality Date   CARDIAC CATHETERIZATION  01/02/2007   IT REVEALS MILD INFERIOR WALL HYPOKINESIS. THE EJECTION FRACTION IS AROUND 50%   COLONOSCOPY     CORONARY ARTERY BYPASS GRAFT  11/06/2006   LIMA to LAD, SVG to  DX, SVG to LCX & SVG to OM 1 & 2, and SVG to PD   CORONARY STENT PLACEMENT     Remote past stent to RCA   EYE SURGERY Right    cataracts removed   HEMORRHOID SURGERY  11/07/1987   IR IMAGING GUIDED PORT INSERTION  10/04/2022   UPPER GI ENDOSCOPY     VENTRICULOSTOMY  10/06/2011   Procedure: VENTRICULOSTOMY;  Surgeon: Rockey LITTIE Peru;  Location: [facility];  Service: Neurosurgery;  Laterality: Right;  Insertion of Ventriculostomy Catheter   VIDEO BRONCHOSCOPY WITH ENDOBRONCHIAL NAVIGATION Left 05/20/2021   Procedure: VIDEO BRONCHOSCOPY WITH ENDOBRONCHIAL NAVIGATION;  Surgeon: Brenna Adine LITTIE, DO;  Location: [facility] OR;  Service: Pulmonary;  Laterality: Left;   VIDEO BRONCHOSCOPY WITH ENDOBRONCHIAL ULTRASOUND Bilateral 05/20/2021   Procedure: VIDEO BRONCHOSCOPY WITH ENDOBRONCHIAL ULTRASOUND;  Surgeon: Brenna Adine LITTIE, DO;  Location: [facility] OR;  Service: Pulmonary;  Laterality: Bilateral;   WISDOM TOOTH EXTRACTION      REVIEW OF SYSTEMS:  A comprehensive review of systems was negative except for: Constitutional: positive for fatigue Respiratory: positive for dyspnea on exertion    LABORATORY DATA: Lab Results  Component Value Date   WBC 7.3 04/29/2024   HGB 12.0 04/29/2024   HCT 36.8 04/29/2024   MCV 94.8 04/29/2024   PLT 301 04/29/2024      Chemistry      Component Value Date/Time   NA 139 04/29/2024 1417   NA 141 04/12/2021 1356   K 3.6 04/29/2024 1417   CL 100 04/29/2024 1417   CO2 32 04/29/2024 1417   BUN 11 04/29/2024 1417   BUN 13 04/12/2021 1356   CREATININE 0.97 04/29/2024 1417   CREATININE 0.99 08/29/2016 1130      Component Value Date/Time   CALCIUM  9.3 04/29/2024 1417   ALKPHOS 91 04/29/2024 1417   AST 14 (L) 04/29/2024 1417   ALT 8 04/29/2024 1417   BILITOT 0.4 04/29/2024 1417       RADIOGRAPHIC STUDIES: CT CHEST ABDOMEN PELVIS W CONTRAST Result Date: 05/07/2024 CLINICAL DATA:  Non-small lung cancer restaging * Tracking Code: BO * EXAM: CT CHEST, ABDOMEN, AND  PELVIS WITH CONTRAST TECHNIQUE: Multidetector CT imaging of the chest, abdomen and pelvis was performed following the standard protocol during bolus administration of intravenous contrast. RADIATION DOSE REDUCTION: This exam was performed according to the departmental dose-optimization program which includes automated exposure control, adjustment of  the mA and/or kV according to patient size and/or use of iterative reconstruction technique. CONTRAST:  OMNIPAQUE  IOHEXOL  300 MG/ML SOLN additional oral enteric contrast COMPARISON:  01/25/2024 FINDINGS: CT CHEST FINDINGS Cardiovascular: Right chest port catheter. Aortic atherosclerosis. Normal heart size. Three-vessel coronary artery calcifications. No pericardial effusion. Mediastinum/Nodes: No enlarged mediastinal, hilar, or axillary lymph nodes. Small hiatal hernia. Thyroid  gland, trachea, and esophagus demonstrate no significant findings. Lungs/Pleura: Severe emphysema. Unchanged post treatment appearance of the left hemithorax with dense radiation fibrosis and consolidation of the perihilar left upper lobe and lingula, central mass predominantly obscured by adjacent fibrosis but not obviously changed at approximately 2.9 x 2.1 cm (series 6, image 55). Unchanged, moderate, loculated left pleural effusion and associated rounded atelectasis. Unchanged pulmonary nodules, a spiculated nodule in the anterior right upper lobe measuring 0.8 cm (series 6, image 45), a subsolid nodule of the lateral segment right middle lobe measuring 0.9 cm (series 6, image 87), and clustered centrilobular and tree-in-bud nodularity in the dependent right lower lobe (series 6, image 133). Musculoskeletal: No chest wall abnormality. No acute osseous findings. CT ABDOMEN PELVIS FINDINGS Hepatobiliary: No solid liver abnormality is seen. No gallstones. Unchanged intrahepatic biliary ductal dilatation (series 2, image 59). Pancreas: Unremarkable. No pancreatic ductal dilatation or  surrounding inflammatory changes. Spleen: Normal in size without significant abnormality. Adrenals/Urinary Tract: Adrenal glands are unremarkable. Kidneys are normal, without renal calculi, solid lesion, or hydronephrosis. Bladder is unremarkable. Stomach/Bowel: Stomach is within normal limits. Appendix appears normal. No evidence of bowel wall thickening, distention, or inflammatory changes. Vascular/Lymphatic: Severe aortic atherosclerosis. No enlarged abdominal or pelvic lymph nodes. Reproductive: No mass or other abnormality. Other: No abdominal wall hernia or abnormality. No ascites. Musculoskeletal: No acute osseous findings. IMPRESSION: 1. Unchanged post treatment appearance of the left hemithorax with dense radiation fibrosis and consolidation of the perihilar left upper lobe and lingula, central mass predominantly obscured by adjacent fibrosis but not obviously changed. 2. Unchanged, moderate, loculated left pleural effusion and associated rounded atelectasis. 3. Unchanged pulmonary nodules, a spiculated nodule in the anterior right upper lobe measuring 0.8 cm and a subsolid nodule of the lateral segment right middle lobe measuring 0.9 cm. Attention on follow-up. 4. Nonspecific infectious or inflammatory clustered centrilobular and tree-in-bud nodularity in the dependent right lower lobe. Attention on follow-up. 5. No evidence of lymphadenopathy or metastatic disease in the abdomen or pelvis. 6. Severe emphysema. 7. Coronary artery disease. Aortic Atherosclerosis (ICD10-I70.0) and Emphysema (ICD10-J43.9). Electronically Signed   By: Marolyn JONETTA Jaksch M.D.   On: 05/07/2024 06:51    ASSESSMENT AND PLAN:  This is a very pleasant 73 years old white female diagnosed with a stage IV (T2 a, N2, M1 B) non-small cell lung cancer, adenocarcinoma presented with left upper lobe lung mass in addition to left hilar and precarinal metastatic adenopathy as well as small left lower lobe pulmonary nodule and solitary left  frontal brain metastasis diagnosed in July 2022.  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 The patient is status post SRS to the solitary brain metastasis. She underwent a course of concurrent chemoradiation to the locally advanced disease in the chest with carboplatin  for AUC of 2 and paclitaxel  45 Mg/M2 status post 7 cycles.  The patient has been tolerating her treatment well with no concerning adverse effects. Her scan showed mild improvement of her disease with no concerning findings for progression. Technically the patient has a stage IV lung cancer with brain metastasis  The patient  is currently undergoing systemic chemotherapy with carboplatin  for AUC of 5, Alimta  500 Mg/M2 and Keytruda  200 Mg IV every 3 weeks status post 33 cycles.  Starting from cycle #5 the patient will be on maintenance treatment with Alimta  and Keytruda  every 3 weeks.  Starting from cycle #30 she will be on treatment with single agent Keytruda  every 3 weeks.  Alimta  was discontinued secondary to increasing fatigue and weakness and intolerance.  She is currently on observation. She had repeat CT scan of the chest, abdomen and pelvis performed recently.  I personally and independently reviewed the scan and discussed the result with the patient today.  Her scan showed no concerning findings for disease progression. Assessment and Plan Assessment & Plan Stage 4 non-small cell lung cancer, adenocarcinoma Stage 4 non-small cell lung cancer, adenocarcinoma, diagnosed in July 2022 with positive KRSG12C mutation and negative PD-L1 expression. Recent CT scan of the chest, abdomen, and pelvis shows unchanged pulmonary nodules: an 8 mm speculated nodule in the anterior right upper lobe and a 9 mm subsolid nodule in the right middle lobe. No new growths or changes in existing nodules, indicating well-controlled disease. - Order repeat CT scan in 3 months - Schedule brain scan for next  Wednesday - Schedule blood work in 3 months - Arrange port flush every 6 weeks She was advised to call immediately if she has any other concerning symptoms in the interval. I discussed the assessment and treatment plan with the patient. The patient was provided an opportunity to ask questions and all were answered. The patient agreed with the plan and demonstrated an understanding of the instructions.   The patient was advised to call back or seek an in-person evaluation if the symptoms worsen or if the condition fails to improve as anticipated.  I provided 20 minutes of non face-to-face telephone visit time during this encounter, and > 50% was spent counseling as documented under my assessment & plan.  Sherrod MARLA Sherrod, MD 05/08/2024 3:40 PM  Disclaimer: This note was dictated with voice recognition software. Similar sounding words can inadvertently be transcribed and may not be corrected upon review.

## 2024-05-12 ENCOUNTER — Other Ambulatory Visit: Payer: Self-pay | Admitting: Family Medicine

## 2024-05-13 ENCOUNTER — Other Ambulatory Visit: Payer: Self-pay | Admitting: Family Medicine

## 2024-05-13 MED ORDER — CLONAZEPAM 0.5 MG PO TABS: 0.5000 mg | ORAL_TABLET | Freq: Two times a day (BID) | ORAL | 2 refills | Status: DC | PRN

## 2024-05-13 NOTE — Telephone Encounter (Signed)
 Patient calls nurse line regarding Klonopin  refill.   Patient reports picking up last rx on 04/14/24.  She is now out of medication.   Patient is also asking when provider wanted her to return for follow up.   Please advise.   Chiquita JAYSON English, RN

## 2024-05-14 ENCOUNTER — Telehealth: Payer: Self-pay | Admitting: Internal Medicine

## 2024-05-14 ENCOUNTER — Inpatient Hospital Stay: Admission: RE | Admit: 2024-05-14 | Source: Ambulatory Visit

## 2024-05-14 NOTE — Telephone Encounter (Signed)
 Left the patient a voicemail with the scheduled appointments and will be mailed a reminder.

## 2024-05-19 ENCOUNTER — Inpatient Hospital Stay: Payer: Self-pay | Attending: Internal Medicine

## 2024-05-19 ENCOUNTER — Ambulatory Visit: Admitting: Physician Assistant

## 2024-05-20 NOTE — Telephone Encounter (Signed)
 Attempted to call patient to schedule appointment. She did not answer, LVM asking her to return call to office for scheduling.   Chiquita JAYSON English, RN

## 2024-05-21 ENCOUNTER — Ambulatory Visit: Payer: Self-pay | Admitting: Urology

## 2024-05-22 NOTE — Telephone Encounter (Signed)
 Patient returns call to nurse line requesting an apt.   Patient declined the available dates for PCP at this time.   Advised apt was for Klonopin  management.   Patient stated, I should have another refill for August and I will call back for September apt.   Will forward to PCP to make aware.

## 2024-05-22 NOTE — Progress Notes (Signed)
 Telephone nursing appointment for review of most recent MRI-Brain results. I verified patient's identity x2 and began nursing interview.   Patient states issues as follows...   -Fatigue: Mild -Hair Loss: Denies -Skin: Denies -Weakness: Denies -Loss of control of extremities: Denies -Headache: Occasional pain 3/10 -Seizure/ uncontrolled movement: Denies -Vision: Denies -Speech: Denies -Confusion: Denies -Dexamethasone / steroids: Denies  Patient denies any other related issues at this time.   Meaningful use complete.   Patient aware of their telephone appointment w/ Ashlyn Bruning PA-C. I left my extension [phone] in case patient needs anything. Patient verbalized understanding. This concludes the nursing interview.   Patient preferred phone # [phone]   Rosaline Minerva, LPN

## 2024-05-23 ENCOUNTER — Encounter: Payer: Self-pay | Admitting: Advanced Practice Midwife

## 2024-05-27 ENCOUNTER — Ambulatory Visit
Admission: RE | Admit: 2024-05-27 | Discharge: 2024-05-27 | Disposition: A | Discharge status: Home / Self Care | Source: Ambulatory Visit | Attending: Radiation Oncology | Admitting: Radiation Oncology

## 2024-05-27 DIAGNOSIS — C7931 Secondary malignant neoplasm of brain: Secondary | ICD-10-CM

## 2024-05-27 DIAGNOSIS — C801 Malignant (primary) neoplasm, unspecified: Secondary | ICD-10-CM | POA: Diagnosis not present

## 2024-05-27 MED ORDER — GADOPICLENOL 0.5 MMOL/ML IV SOLN
5.0000 mL | Freq: Once | INTRAVENOUS | Status: AC | PRN
  Administered 2024-05-27: 5 mL via INTRAVENOUS

## 2024-05-28 ENCOUNTER — Ambulatory Visit: Payer: Self-pay | Admitting: Radiation Oncology

## 2024-06-02 ENCOUNTER — Encounter

## 2024-06-04 ENCOUNTER — Other Ambulatory Visit: Payer: Self-pay | Admitting: Radiation Therapy

## 2024-06-04 ENCOUNTER — Ambulatory Visit
Admission: RE | Admit: 2024-06-04 | Discharge: 2024-06-04 | Disposition: A | Discharge status: Home / Self Care | Source: Ambulatory Visit | Attending: Urology | Admitting: Urology

## 2024-06-04 ENCOUNTER — Encounter: Payer: Self-pay | Admitting: Urology

## 2024-06-04 VITALS — Ht 66.0 in | Wt 114.0 lb

## 2024-06-04 DIAGNOSIS — C3412 Malignant neoplasm of upper lobe, left bronchus or lung: Secondary | ICD-10-CM | POA: Diagnosis not present

## 2024-06-04 DIAGNOSIS — C3490 Malignant neoplasm of unspecified part of unspecified bronchus or lung: Secondary | ICD-10-CM

## 2024-06-04 DIAGNOSIS — C7931 Secondary malignant neoplasm of brain: Secondary | ICD-10-CM | POA: Diagnosis not present

## 2024-06-04 NOTE — Progress Notes (Signed)
 Radiation Oncology         [phone] ________________________________  Name: Jill [name] MRN: [id number]  Date: 06/04/2024  DOB: 1951/03/06  Post Treatment Note  CC: Anders Otto DASEN, MD  Anders Otto DASEN, MD  Diagnosis:   73 yo woman with stage IV NSCLC, adenocarcinoma of the left upper lung - Stage IVA - with brain metastases     Interval Since Last Radiation: 2.5 years 09/28/21: SRS//PTV2-3: The metastatic lesions  in the left occipital (3 mm) and left temporal (1 mm) were treated to 20 Gy in a single fraction  06/23/21: SRS// PTV1: The solitary 7 mm left frontal  brain metastasis was treated to 20 Gy in a single fraction   06/15/21 - 08/01/21: The primary tumor in the left lung and nodes were treated to 66 Gy in 33 fractions of 2 Gy (concurrent with chemotherapy).  Narrative:  I contacted the patient to conduct her routine scheduled 6 month follow up visit to review results of her recent MRI brain scan via telephone to spare the patient unnecessary potential exposure in the healthcare setting.  The patient was notified in advance and gave permission to proceed with this visit format.    She tolerated her SRS treatments well and she remains without complaints.  Her recent MRI brain scan from 05/27/24 shows a stable appearance of the residual focus of enhancement in the left frontal lobe (previously treated lesion) and no visible sequela of previously treated lesions in the left temporal lobe and left occipital lobe. There are no new lesions to suggest disease recurrence or progression.  We reviewed these results by telephone today.                        On review of systems, the patient states that she is doing well in general and is currently without complaints.. She denies any recent headaches, N/V/D. Her weight has remained stable. She specifically denies chest pain, increased shortness of breath or hemoptysis. She has occasional, mild headaches but nothing that has been persistent  or concerning.  She denies any decrease in her visual or auditory acuity, focal weakness in the upper or lower extremities, tremor or seizure activity.  She initially completed 6 cycles of systemic therapy with carboplatin /paclitaxel  systemic chemotherapy prior to switching to carboplatin , Alimta  and Keytruda  which she completed 5 cycles of. She then changed to maintenance therapy with Alimta /Keytruda  beginning 12/29/2021. The Alimta  was discontinued in July 2024 due to progressive fatigue and weakness so she continued on single agent Keytruda  from cycle 30-33, completed 09/2023. Her restaging CT C/A/P from 04/29/24 was stable in appearance and without evidence of disease recurrence or progression. Overall, she is pleased with her progress to date.    Her 12th grandchild, 5th grand-daughter, was born 11/02/2021 and brings her great joy. She enjoys spending time with her grand-kids daily which helps keep her active.  ALLERGIES:  is allergic to varenicline, ace inhibitors, prednisone, betalin 12  [vitamin b12], penicillins, and sulfa drugs cross reactors.  Meds: Current Outpatient Medications  Medication Sig Dispense Refill   acetaminophen  (TYLENOL ) 500 MG tablet Take 500-1,000 mg by mouth every 6 (six) hours as needed for moderate pain (pain score 4-6). (Patient not taking: Reported on 03/10/2024)     albuterol  (PROVENTIL ) (2.5 MG/3ML) 0.083% nebulizer solution Take 3 mLs (2.5 mg total) by nebulization every 6 (six) hours as needed for wheezing or shortness of breath. (Patient not taking: Reported on 03/10/2024) 75 mL  12   albuterol  (VENTOLIN  HFA) 108 (90 Base) MCG/ACT inhaler Inhale 2 puffs into the lungs every 6 (six) hours as needed for wheezing or shortness of breath. (Patient not taking: Reported on 03/10/2024) 8 g 2   amLODipine  (NORVASC ) 10 MG tablet TAKE 1 TABLET BY MOUTH ONCE DAILY . APPOINTMENT REQUIRED FOR FUTURE REFILLS 90 tablet 1   aspirin  EC 81 MG tablet Take 1 tablet (81 mg total) by mouth daily.  90 tablet 2   atorvastatin  (LIPITOR) 40 MG tablet TAKE 1 TABLET BY MOUTH EVERY DAY 90 tablet 1   budeson-glycopyrrolate -formoterol  (BREZTRI  AEROSPHERE) 160-9-4.8 MCG/ACT AERO inhaler Inhale 2 puffs into the lungs in the morning and at bedtime. 3 each 2   Cholecalciferol (VITAMIN D3 PO) Take 1 tablet by mouth daily. (Patient not taking: Reported on 03/10/2024)     clonazePAM  (KLONOPIN ) 0.5 MG tablet Take 1 tablet (0.5 mg total) by mouth 2 (two) times daily as needed for anxiety. 60 tablet 2   escitalopram  (LEXAPRO ) 10 MG tablet TAKE 1 TABLET BY MOUTH EVERY DAY 90 tablet 1   ferrous sulfate  324 MG TBEC Take 1 tablet (324 mg total) by mouth daily with breakfast. 90 tablet 1   folic acid  (FOLVITE ) 1 MG tablet TAKE 1 TABLET BY MOUTH EVERY DAY (Patient not taking: Reported on 03/10/2024) 90 tablet 0   lactose free nutrition (BOOST PLUS) LIQD Take 237 mLs by mouth 2 (two) times daily between meals. 42660 mL 2   levothyroxine  (SYNTHROID ) 100 MCG tablet TAKE 1 TABLET (100 MCG TOTAL) BY MOUTH EVERY MORNING. 30 MINUTES BEFORE FOOD 90 tablet 1   lidocaine -prilocaine  (EMLA ) cream Apply 1 Application topically as needed. (Patient not taking: Reported on 03/10/2024) 30 g 2   metoprolol  succinate (TOPROL -XL) 50 MG 24 hr tablet TAKE 1 TABLET BY MOUTH ONCE DAILY WITH MEALS OR IMMEDIATLEY AFTER A MEAL 90 tablet 1   Multiple Vitamins-Minerals (MULTIVITAMIN WITH MINERALS) tablet Take 1 tablet by mouth daily.     nitroGLYCERIN  (NITROSTAT ) 0.4 MG SL tablet Place 1 tablet (0.4 mg total) under the tongue every 5 (five) minutes as needed. For chest pain (Patient not taking: Reported on 03/10/2024) 25 tablet 6   prochlorperazine  (COMPAZINE ) 10 MG tablet TAKE 1 TABLET BY MOUTH EVERY 6 HOURS AS NEEDED NAUSEA AND VOMITING 30 tablet 0   senna ([employer] SENNA) 8.6 MG tablet TAKE 1 TABLET (8.6 MG TOTAL) BY MOUTH 2 (TWO) TIMES DAILY AS NEEDED FOR MILD CONSTIPATION. (Patient not taking: Reported on 03/10/2024) 60 tablet 1   VITAMIN A PO Take 1  tablet by mouth daily.     No current facility-administered medications for this encounter.    Physical Findings:  height is 5' 6 (1.676 m) and weight is 114 lb (51.7 kg).  Pain Assessment Pain Score: 0-No pain/10 Unable to assess due to telephone follow-up visit format.  Lab Findings: Lab Results  Component Value Date   WBC 7.3 04/29/2024   HGB 12.0 04/29/2024   HCT 36.8 04/29/2024   MCV 94.8 04/29/2024   PLT 301 04/29/2024     Radiographic Findings: MR Brain W Wo Contrast Result Date: 05/28/2024 EXAM: MRI BRAIN WITH AND WITHOUT CONTRAST 05/27/2024 12:18:52 PM TECHNIQUE: Multiplanar multisequence MRI of the head/brain was performed with and without the administration of intravenous contrast. COMPARISON: None available. CLINICAL HISTORY: Brain metastases, assess treatment response; 3T SRS Protocol. FINDINGS: BRAIN AND VENTRICLES: The ring enhancing left frontal lobe metastasis is stable in size, measuring 6.5 mm. Surrounding vasogenic edema is stable.  No new enhancing foci are present. Encephalomalacia associated with previous right ventriculostomy tract is again noted. Susceptibility foci in the left thalamus and cerebral peduncle are stable. Periventricular and scattered subcortical T2 hyperintensities are otherwise stable, mildly advanced for age. No acute infarct. No acute intracranial hemorrhage. No midline shift. No hydrocephalus. The sella is unremarkable. Normal flow voids. ORBITS: Right lens replacement is noted. Globes and orbits are otherwise within normal limits. SINUSES AND MASTOIDS: Mild mucosal thickening is present in the maxillary sinuses and ethmoid air cells bilaterally. No obstructing lesion is present. BONES AND SOFT TISSUES: Normal bone marrow signal. No acute soft tissue abnormality. IMPRESSION: 1. Stable ring enhancing left frontal lobe metastasis measuring 6.5 mm with surrounding vasogenic edema. No new enhancing foci. 2. Encephalomalacia associated with previous  right ventriculostomy tract. 3. Stable susceptibility foci in the left thalamus and cerebral peduncle, compatible with remote hemorrhage. 4. Stable periventricular and scattered subcortical T2 hyperintensities, mildly advanced for age. Most likely reflects the sequelae of chronic microvascular ischemia. 5. Mild mucosal thickening in the maxillary sinuses and ethmoid air cells bilaterally. No obstructing lesion. Electronically signed by: Lonni Necessary MD 05/28/2024 06:25 AM EDT RP Workstation: [id number]    Impression/Plan: 1. 73 yo woman with brain metastasis secondary to metastatic, NSCLC, adenocarcinoma of the left upper lung - Stage IVA. She has recovered well from the effects of her previous radiotherapy and remains without complaints. Her maintenance therapy with single agent Keytruda  was discontinued in 09/2023 at her request due to significant fatigue and she has continued to be followed in observation. Her recent MRI brain scan from 05/27/24 shows a stable appearance of the residual focus of enhancement in the left frontal lobe (previously treated lesion) and no visible sequela of previously treated lesions in the left temporal lobe and left occipital lobe. There are no new lesions to suggest disease recurrence or progression.  She will continue management of her systemic disease under the care and direction of Dr. Gatha and is scheduled for repeat systemic imaging 08/04/24 prior to a follow up visit with him on 08/11/24.  Regarding the brain metastases, since her disease has remained stable for the past 2 years, we will continue with 6 month surveillance scans to continue to monitor for any evidence of disease progression or recurrence. We will continue to connect by telephone following each scan to review the results and recommendations.  She appears to have a good understanding of these recommendations and is comfortable and in agreement with the stated plan.  She knows to call at anytime in  the interim with any questions or concerns related to her previous radiation.  I personally spent 20 minutes in this encounter including chart review, reviewing radiological studies, telephone discussion with the patient, entering orders and completing documentation.     Sabra MICAEL Rusk, MMS, PA-C [facility] Radiation Oncology Physician Assistant Direct Dial: [phone]  Fax: [phone]

## 2024-06-06 ENCOUNTER — Other Ambulatory Visit: Payer: Self-pay | Admitting: Medical Oncology

## 2024-06-08 ENCOUNTER — Encounter: Payer: Self-pay | Admitting: Internal Medicine

## 2024-06-08 ENCOUNTER — Encounter: Payer: Self-pay | Admitting: Physician Assistant

## 2024-06-09 ENCOUNTER — Inpatient Hospital Stay: Attending: Internal Medicine

## 2024-06-09 ENCOUNTER — Encounter: Payer: Self-pay | Admitting: Internal Medicine

## 2024-06-09 ENCOUNTER — Ambulatory Visit (INDEPENDENT_AMBULATORY_CARE_PROVIDER_SITE_OTHER): Admitting: Family Medicine

## 2024-06-09 ENCOUNTER — Encounter: Payer: Self-pay | Admitting: Physician Assistant

## 2024-06-09 ENCOUNTER — Encounter: Payer: Self-pay | Admitting: Family Medicine

## 2024-06-09 ENCOUNTER — Ambulatory Visit (HOSPITAL_COMMUNITY)
Admission: RE | Admit: 2024-06-09 | Discharge: 2024-06-09 | Disposition: A | Discharge status: Home / Self Care | Source: Ambulatory Visit | Attending: Family Medicine | Admitting: Family Medicine

## 2024-06-09 VITALS — BP 127/73 | HR 66 | Ht 66.0 in | Wt 119.5 lb

## 2024-06-09 DIAGNOSIS — Z95828 Presence of other vascular implants and grafts: Secondary | ICD-10-CM

## 2024-06-09 DIAGNOSIS — G8929 Other chronic pain: Secondary | ICD-10-CM | POA: Diagnosis not present

## 2024-06-09 DIAGNOSIS — Z452 Encounter for adjustment and management of vascular access device: Secondary | ICD-10-CM | POA: Insufficient documentation

## 2024-06-09 DIAGNOSIS — M25562 Pain in left knee: Secondary | ICD-10-CM | POA: Diagnosis not present

## 2024-06-09 DIAGNOSIS — C3412 Malignant neoplasm of upper lobe, left bronchus or lung: Secondary | ICD-10-CM | POA: Diagnosis not present

## 2024-06-09 DIAGNOSIS — L858 Other specified epidermal thickening: Secondary | ICD-10-CM

## 2024-06-09 DIAGNOSIS — C3490 Malignant neoplasm of unspecified part of unspecified bronchus or lung: Secondary | ICD-10-CM

## 2024-06-09 DIAGNOSIS — J441 Chronic obstructive pulmonary disease with (acute) exacerbation: Secondary | ICD-10-CM | POA: Diagnosis not present

## 2024-06-09 MED ORDER — SODIUM CHLORIDE 0.9% FLUSH
10.0000 mL | Freq: Once | INTRAVENOUS | Status: AC
  Administered 2024-06-09: 10 mL

## 2024-06-09 NOTE — Progress Notes (Signed)
    SUBJECTIVE:   CHIEF COMPLAINT / HPI:   Knee Pain  Incident onset: Left knee pain x 6 months. There was no injury mechanism. The pain is present in the left leg. The quality of the pain is described as aching (Dull aching pain). The pain is at a severity of 6/10 (Sometime pain is about 10/10 in severity especially when she gets ready to sleep). The pain is moderate. The pain has been Intermittent since onset. Associated symptoms include an inability to bear weight. Pertinent negatives include no muscle weakness, numbness or tingling. Associated symptoms comments: Pain is worse with ambulation and improves with rest. Uses Tylenol  prn pain.   Skin lesion: C/O dry scaly horn on the lateral side of her left thigh, ongoing x 2 years. This will fall off and then regrow. She sometimes tries to use her nail to peel off the lesion. No other associated symptoms.  PERTINENT  PMH / PSH: PMHx reviewed  OBJECTIVE:   BP 127/73   Pulse 66   Ht 5' 6 (1.676 m)   Wt 119 lb 8 oz (54.2 kg)   BMI 19.29 kg/m   Physical Exam Vitals and nursing note reviewed.  Cardiovascular:     Rate and Rhythm: Normal rate and regular rhythm.     Heart sounds: Normal heart sounds. No murmur heard. Pulmonary:     Effort: Pulmonary effort is normal. No respiratory distress.     Breath sounds: Normal breath sounds. No wheezing or rhonchi.  Musculoskeletal:     Right knee: Normal. No deformity, effusion or erythema. Normal range of motion.     Left knee: No deformity or erythema. Normal range of motion.     Comments: Small effusion on the medial aspect of her left knee. No erythema or tenderness. Mild tenderness with passive ROM. No laxity of both joints.  Skin:    Comments: Firm, scaly, whitish, raised lesion on the lateral aspect of her left thigh, a few inches above her knee      ASSESSMENT/PLAN:   Assessment & Plan Chronic pain of left knee ?? 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

## 2024-06-09 NOTE — Patient Instructions (Signed)
 Knee Exercises Ask your health care provider which exercises are safe for you. Do exercises exactly as told by your health care provider and adjust them as directed. It is normal to feel mild stretching, pulling, tightness, or discomfort as you do these exercises. Stop right away if you feel sudden pain or your pain gets worse. Do not begin these exercises until told by your health care provider. Stretching and range-of-motion exercises These exercises warm up your muscles and joints and improve the movement and flexibility of your knee. These exercises also help to relieve pain and swelling. Knee extension, prone  Lie on your abdomen (prone position) on a bed. Place your left / right knee just beyond the edge of the surface so your knee is not on the bed. You can put a towel under your left / right thigh just above your kneecap for comfort. Relax your leg muscles and allow gravity to straighten your knee (extension). You should feel a stretch behind your left / right knee. Hold this position for __________ seconds. Scoot up so your knee is supported between repetitions. Repeat __________ times. Complete this exercise __________ times a day. Knee flexion, active  Lie on your back with both legs straight. If this causes back discomfort, bend your left / right knee so your foot is flat on the floor. Slowly slide your left / right heel back toward your buttocks. Stop when you feel a gentle stretch in the front of your knee or thigh (flexion). Hold this position for __________ seconds. Slowly slide your left / right heel back to the starting position. Repeat __________ times. Complete this exercise __________ times a day. Quadriceps stretch, prone  Lie on your abdomen on a firm surface, such as a bed or padded floor. Bend your left / right knee and hold your ankle. If you cannot reach your ankle or pant leg, loop a belt around your foot and grab the belt instead. Gently pull your heel toward your  buttocks. Your knee should not slide out to the side. You should feel a stretch in the front of your thigh and knee (quadriceps). Hold this position for __________ seconds. Repeat __________ times. Complete this exercise __________ times a day. Hamstring, supine  Lie on your back (supine position). Loop a belt or towel over the ball of your left / right foot. The ball of your foot is on the walking surface, right under your toes. Straighten your left / right knee and slowly pull on the belt to raise your leg until you feel a gentle stretch behind your knee (hamstring). Do not let your knee bend while you do this. Keep your other leg flat on the floor. Hold this position for __________ seconds. Repeat __________ times. Complete this exercise __________ times a day. Strengthening exercises These exercises build strength and endurance in your knee. Endurance is the ability to use your muscles for a long time, even after they get tired. Quadriceps, isometric This exercise strengthens the muscles in front of your thigh (quadriceps) without moving your knee joint (isometric). Lie on your back with your left / right leg extended and your other knee bent. Put a rolled towel or small pillow under your knee if told by your health care provider. Slowly tense the muscles in the front of your left / right thigh. You should see your kneecap slide up toward your hip or see increased dimpling just above the knee. This motion will push the back of the knee toward the floor.  For __________ seconds, hold the muscle as tight as you can without increasing your pain. Relax the muscles slowly and completely. Repeat __________ times. Complete this exercise __________ times a day. Straight leg raises This exercise strengthens the muscles in front of your thigh (quadriceps) and the muscles that move your hips (hip flexors). Lie on your back with your left / right leg extended and your other knee bent. Tense the  muscles in the front of your left / right thigh. You should see your kneecap slide up or see increased dimpling just above the knee. Your thigh may even shake a bit. Keep these muscles tight as you raise your leg 4-6 inches (10-15 cm) off the floor. Do not let your knee bend. Hold this position for __________ seconds. Keep these muscles tense as you lower your leg. Relax your muscles slowly and completely after each repetition. Repeat __________ times. Complete this exercise __________ times a day. Hamstring, isometric  Lie on your back on a firm surface. Bend your left / right knee about __________ degrees. Dig your left / right heel into the surface as if you are trying to pull it toward your buttocks. Tighten the muscles in the back of your thighs (hamstring) to "dig" as hard as you can without increasing any pain. Hold this position for __________ seconds. Release the tension gradually and allow your muscles to relax completely for __________ seconds after each repetition. Repeat __________ times. Complete this exercise __________ times a day. Hamstring curls If told by your health care provider, do this exercise while wearing ankle weights. Begin with __________lb / kg weights. Then increase the weight by 1 lb (0.5 kg) increments. Do not wear ankle weights that are more than __________lb / kg. Lie on your abdomen with your legs straight. Bend your left / right knee as far as you can without feeling pain. Keep your hips flat against the floor. Hold this position for __________ seconds. Slowly lower your leg to the starting position. Repeat __________ times. Complete this exercise __________ times a day. Squats This exercise strengthens the muscles in front of your thigh and knee (quadriceps). Stand in front of a table, with your feet and knees pointing straight ahead. You may rest your hands on the table for balance but not for support. Slowly bend your knees and lower your hips like you  are going to sit in a chair. Keep your weight over your heels, not over your toes. Keep your lower legs upright so they are parallel with the table legs. Do not let your hips go lower than your knees. Do not bend lower than told by your health care provider. If your knee pain increases, do not bend as low. Hold the squat position for __________ seconds. Slowly push with your legs to return to standing. Do not use your hands to pull yourself to standing. Repeat __________ times. Complete this exercise __________ times a day. Wall slides This exercise strengthens the muscles in front of your thigh and knee (quadriceps). Lean your back against a smooth wall or door, and walk your feet out 18-24 inches (46-61 cm) from it. Place your feet hip-width apart. Slowly slide down the wall or door until your knees bend __________ degrees. Keep your knees over your heels, not over your toes. Keep your knees in line with your hips. Hold this position for __________ seconds. Repeat __________ times. Complete this exercise __________ times a day. Straight leg raises, side-lying This exercise strengthens the muscles that rotate  the leg at the hip and move it away from your body (hip abductors). Lie on your side with your left / right leg in the top position. Lie so your head, shoulder, knee, and hip line up. You may bend your bottom knee to help you keep your balance. Roll your hips slightly forward so your hips are stacked directly over each other and your left / right knee is facing forward. Leading with your heel, lift your top leg 4-6 inches (10-15 cm). You should feel the muscles in your outer hip lifting. Do not let your foot drift forward. Do not let your knee roll toward the ceiling. Hold this position for __________ seconds. Slowly return your leg to the starting position. Let your muscles relax completely after each repetition. Repeat __________ times. Complete this exercise __________ times a  day. Straight leg raises, prone This exercise stretches the muscles that move your hips away from the front of the pelvis (hip extensors). Lie on your abdomen on a firm surface. You can put a pillow under your hips if that is more comfortable. Tense the muscles in your buttocks and lift your left / right leg about 4-6 inches (10-15 cm). Keep your knee straight as you lift your leg. Hold this position for __________ seconds. Slowly lower your leg to the starting position. Let your leg relax completely after each repetition. Repeat __________ times. Complete this exercise __________ times a day. This information is not intended to replace advice given to you by your health care provider. Make sure you discuss any questions you have with your health care provider. Document Revised: 07/05/2021 Document Reviewed: 07/05/2021 Elsevier Patient Education  2024 [employer].

## 2024-06-10 ENCOUNTER — Ambulatory Visit: Payer: Self-pay | Admitting: Family Medicine

## 2024-06-10 ENCOUNTER — Inpatient Hospital Stay: Payer: Self-pay

## 2024-06-10 ENCOUNTER — Telehealth: Payer: Self-pay

## 2024-06-10 DIAGNOSIS — G8929 Other chronic pain: Secondary | ICD-10-CM

## 2024-06-10 MED ORDER — IBUPROFEN 400 MG PO TABS: 400.0000 mg | ORAL_TABLET | Freq: Two times a day (BID) | ORAL | 1 refills | Status: DC | PRN

## 2024-06-10 NOTE — Telephone Encounter (Signed)
 Returned call to patient and provided with update per Dr. Anders.   She states that she is not having any current respiratory symptoms.   Return precautions discussed.   Chiquita JAYSON English, RN

## 2024-06-10 NOTE — Progress Notes (Signed)
 Please advise the patient that her chest X-ray does not show acute infection. She has stable lung scarring and effusion, which is unchanged from prior, with underlying emphysema/COPD. Please advise her to see us  soon if she has persistent or worsening respiratory symptoms.

## 2024-06-10 NOTE — Telephone Encounter (Signed)
 Patient calls nurse line and states that she is unable to access mychart.   Verified name and DOB and provided patient with message per Dr. Anders.   Patient is also asking about results from CXR. It looks like this was ordered by Dr. Christia on 01/11/24. Will forward to PCP for review.   Chiquita JAYSON English, RN

## 2024-06-10 NOTE — Telephone Encounter (Signed)
 Patient popped back into our WQ. She received Feraheme on 12/14/23 but never got the 2nd dose. I called the patient and confirmed that the 2nd dose was not needed because her counts went up. I am closing the referral and we will D/C the therapy plan.

## 2024-06-16 ENCOUNTER — Encounter: Payer: Self-pay | Admitting: Internal Medicine

## 2024-06-22 ENCOUNTER — Other Ambulatory Visit: Payer: Self-pay | Admitting: Family Medicine

## 2024-07-10 ENCOUNTER — Ambulatory Visit (INDEPENDENT_AMBULATORY_CARE_PROVIDER_SITE_OTHER): Payer: Self-pay | Admitting: Family Medicine

## 2024-07-10 VITALS — BP 128/68 | HR 70 | Wt 124.6 lb

## 2024-07-10 DIAGNOSIS — L989 Disorder of the skin and subcutaneous tissue, unspecified: Secondary | ICD-10-CM | POA: Diagnosis not present

## 2024-07-10 NOTE — Progress Notes (Signed)
    SUBJECTIVE:   CHIEF COMPLAINT / HPI:   Jill [name] is a 73 y.o. female presenting for excisional biopsy of suspected AK v squamous cell lesion. Referral per PCP Dr. Anders.   PERTINENT  PMH / PSH: reviewed and updated.  OBJECTIVE:   BP 128/68   Pulse 70   Wt 124 lb 9.6 oz (56.5 kg)   SpO2 91%   BMI 20.11 kg/m   Skin: large raised lesion on left anterior thigh    ASSESSMENT/PLAN:   Assessment & Plan Skin lesion Patient agreed to proceed with excisional biopsy.   PROCEDURE NOTE: EXCISION   Patient given informed consent, signed copy in the chart.  Appropriate time out taken.  Area prepped and draped in usual sterile fashion  A 5 Cc of  1%  lidocaine  with epi was used to obtain local anesthesia. After anesthesia was obtained, patient was cleansed with iodine and draped in sterile fashion. Using a curved scalpel the lesion was cut around using 2 mm margin in an elliptical shape.     Hemostasis obtained.  Amount of bleeding was less than 1 cc.  The excised tissue was sent for pathology.   The incision was then closed using 5 internal interrupted sutures of 3-0 Vicryl and then an additional 5 simple 5-0 Ethilon sutures were used for external closure.  There were  no complications.   Patient tolerated procedure well.  Patient was given information about concerning signs and symptoms to watch for, such as fever, redness, unusual swelling, significant pain, drainage.   Patient should return for followup  :07/22/24 with Dr. Anders for suture removal  Pathology pending    Damien Pinal, DO [facility]

## 2024-07-10 NOTE — Patient Instructions (Signed)
Sutured Wound Care Sutures are stitches that can be used to close wounds. Some stitches break down as they heal (absorbable). Other stitches need to be taken out by your doctor (nonabsorbable). Taking good care of your wound can help to prevent pain and infection. It can also help your wound heal more quickly. Follow instructions from your doctor about how to care for your sutured wound. Supplies needed: Soap and water. A clean, dry towel. Solution to clean your wound, if needed. A clean gauze or bandage (dressing), if needed. Antibiotic ointment, if told by your doctor. How to care for your sutured wound  Keep the wound fully dry for the first 24 hours or as long as told by your doctor. After 24-48 hours, you may shower or bathe as told by your doctor. Do not soak the wound or put the wound under water until the stitches have been taken out. After the first 24 hours, clean the wound once a day, or as often as your doctor tells you to. Take these steps: Wash and rinse the wound as told by your health care provider. Pat the wound dry with a clean towel. Do not rub the wound. After cleaning the wound, put a thin layer of antibiotic ointment on the wound as told by your doctor. This will help: Prevent infection. Keep the bandage from sticking to the wound. Follow instructions from your doctor about how to change your bandage. Make sure you: Wash your hands with soap and water for at least 20 seconds. If you cannot use soap and water, use hand sanitizer. Change your bandage at least once a day, or as often as told by your doctor. If your dressing gets wet or dirty, change it. Leavestitches in place for at least 2 weeks. If you have skin glue over your stitches, this should also stay in place for at least 2 weeks. Leave tape strips alone (if you have them) unless you are told to take them off. You may trim the edges of the tape strips if they curl up. Check your wound every day for signs of  infection. Watch for: Redness, swelling, or pain. Fluid or blood. New warmth, a rash, or hardness at the wound site. Pus or a bad smell. Have the stitches taken out as told by your doctor. Follow these instructions at home: Medicines Take or apply over-the-counter and prescription medicines only as told by your doctor. If you were prescribed an antibiotic medicine or ointment, take or apply it as told by your doctor. Do not stop using the antibiotic even if you start to feel better. General instructions Cover your wound with clothes or put sunscreen on when you are outside. Use a sunscreen of at least 30 SPF. Do not scratch or pick at your wound. Avoid stretching your wound. Raise the injured area above the level of your heart while you are sitting or lying down, if possible. Eat a diet that includes protein, vitamin A, and vitamin C. Doing this will help your wound heal. Drink enough fluid to keep your pee (urine) pale yellow. Keep all follow-up visits. Contact a doctor if: You were given a tetanus shot and you have any of the following at the site where the needle went in: Swelling. Very bad pain. Redness. Bleeding. Your wound breaks open. You see something coming out of your wound, such as wood or glass. You have any of these signs of infection in or around your wound: Redness, swelling, or pain. Fluid or blood.  Warmth. A new rash. Your wound feels hard. You have a fever. The skin near your wound changes color. You have pain that does not get better with medicine. You get numbness around the wound. Get help right away if: You have very bad swelling or more pain around your wound. You have pus or a bad smell coming from your wound. You have painful lumps near your wound or anywhere on your body. You have a red streak going away from your wound. The wound is on your hand or foot, and: Your fingers or toes look pale or blue. You cannot move a finger or toe as you used to  do. You have numbness that spreads down your hand, foot, fingers, or toes. Summary Sutures are stitches that are used to close wounds. Taking good care of your wound can help to prevent pain and infection. Keep the wound fully dry for the first 24 hours or for as long as told by your doctor. After 24-48 hours, you may shower or bathe as told by your doctor. This information is not intended to replace advice given to you by your health care provider. Make sure you discuss any questions you have with your health care provider. Document Revised: 02/28/2021 Document Reviewed: 02/28/2021 Elsevier Patient Education  2024 [employer].

## 2024-07-10 NOTE — Addendum Note (Signed)
 Addended by: ANDERS CUMINS T on: 07/10/2024 04:20 PM   Modules accepted: Orders, Level of Service

## 2024-07-15 ENCOUNTER — Ambulatory Visit: Payer: Self-pay | Admitting: Family Medicine

## 2024-07-15 LAB — DERMATOLOGY PATHOLOGY

## 2024-07-17 ENCOUNTER — Telehealth: Payer: Self-pay

## 2024-07-17 NOTE — Telephone Encounter (Signed)
 Patient called and left a voicemail stating she has a port flush with lab appointment scheduled for 08/04/24 at 9:00 AM and a CT scan at 3:00 PM. She inquired about the possibility of moving the port flush/lab appointment closer to the CT scan time.  Port flush with lab appointment was rescheduled to 2:30 PM on 08/04/24. Voicemail was left for the patient with the updated appointment time. Requested a return call if there are any questions or concerns.

## 2024-07-22 ENCOUNTER — Encounter: Payer: Self-pay | Admitting: Family Medicine

## 2024-07-22 ENCOUNTER — Ambulatory Visit (INDEPENDENT_AMBULATORY_CARE_PROVIDER_SITE_OTHER): Admitting: Family Medicine

## 2024-07-22 VITALS — BP 121/61 | HR 71 | Ht 66.0 in | Wt 124.6 lb

## 2024-07-22 DIAGNOSIS — R052 Subacute cough: Secondary | ICD-10-CM | POA: Diagnosis not present

## 2024-07-22 DIAGNOSIS — J449 Chronic obstructive pulmonary disease, unspecified: Secondary | ICD-10-CM | POA: Diagnosis not present

## 2024-07-22 LAB — POC SOFIA 2 FLU + SARS ANTIGEN FIA
Influenza A, POC: NEGATIVE
Influenza B, POC: NEGATIVE
SARS Coronavirus 2 Ag: NEGATIVE

## 2024-07-22 MED ORDER — AZITHROMYCIN 250 MG PO TABS: ORAL_TABLET | ORAL | 0 refills | Status: DC

## 2024-07-22 NOTE — Assessment & Plan Note (Signed)
 Chronic obstructive pulmonary disease with acute exacerbation and hypoxemia Acute COPD exacerbation with hypoxemia, possible infection considered. - Prescribed Zithromax : two tablets on day one, then one daily for four days. - Advised albuterol  inhaler as needed for cough. - Recommended over-the-counter Mucinex  for cough. - Performed COVID & Influenza test to rule out infection. - Due to intermittent hypoxia, hospitalization recommended, but she declined - Chest xray recommended today but she declined. She prefers to wait for her scheduled CT chest - Follow up with pulmonologist for reassessment. - Advised hospitalization if symptoms worsen, especially with breathing difficulty.

## 2024-07-22 NOTE — Progress Notes (Signed)
    SUBJECTIVE:   CHIEF COMPLAINT / HPI:   Discussed the use of AI scribe software for clinical note transcription with the patient, who gave verbal consent to proceed.  History of Present Illness   Jill [name] is a 73 year old female who presents with worsening cough and fluctuating oxygen levels.  She has been experiencing a worsening productive cough for the past few weeks, with white or clear phlegm. No chest pain is associated with the cough. She experiences night sweats but denies fever. This recent exacerbation of her chronic cough has persisted for more than two weeks.  Her oxygen levels have been fluctuating, with a recent drop that improved. She has not noticed significant changes in her breathing at home, although she has been coughing and sneezing frequently. She has not increased her use of the albuterol  inhaler but has been using Breztri  once daily instead of the prescribed twice daily.  She mentions a potential exposure to COVID-19 through a contact at home, as her partner works with someone who had COVID. She has not seen her pulmonologist recently but has a CT chest scan scheduled for the 29th of this month. Despite concerns about her oxygen levels, she is reluctant to go to the hospital.  Regarding her knee pain, she reports arthritis and has an upcoming physical therapy appointment on the 24th. She also had sutures removed from her left thigh, which were placed for actinic keratosis removal, and she reports no concerns about the healing process.       PERTINENT  PMH / PSH: PMHX reviewed  OBJECTIVE:   BP 121/61   Pulse 71   Ht 5' 6 (1.676 m)   Wt 124 lb 9.6 oz (56.5 kg)   SpO2 96% Comment: RA  BMI 20.11 kg/m   Physical Exam   VITALS: SaO2- 97% GEN: Frail appearing, mild distress due to excessive coughing CHEST: Breath sounds reduced bilaterally, no wheeze or crackles. HEART: S1 S2 normal, no murmurs. RRR SKIN: Incision on left thigh healing well, no signs of  infection.     Results   RADIOLOGY Knee x-ray: Normal  PATHOLOGY Actinic keratosis  Procedure: Suture removal Description: Sutures removed from the left thigh area. The incision site was covered with an adhesive bandage.       ASSESSMENT/PLAN:   Assessment & Plan    Assessment and Plan    Chronic obstructive pulmonary disease with acute exacerbation and hypoxemia Acute COPD exacerbation with hypoxemia, possible infection considered. - Prescribed Zithromax : two tablets on day one, then one daily for four days. - Advised albuterol  inhaler as needed for cough. - Recommended over-the-counter Mucinex  for cough. - Performed COVID & Influenza test to rule out infection. - Due to intermittent hypoxia, hospitalization recommended, but she declined - Chest xray recommended today but she declined. She prefers to wait for her scheduled CT chest - Follow up with pulmonologist for reassessment. - Advised hospitalization if symptoms worsen, especially with breathing difficulty.  Knee osteoarthritis Knee pain due to osteoarthritis. - Encouraged attendance at physical therapy appointment on September 24th.  Status post removal of left thigh sutures for actinic keratosis Sutures removed without complications; lesion confirmed as non-cancerous actinic keratosis. - Applied Band-Aid to incision site.        Otto Fairly, MD [facility]

## 2024-07-22 NOTE — Patient Instructions (Signed)

## 2024-07-29 NOTE — Therapy (Incomplete)
 OUTPATIENT PHYSICAL THERAPY LOWER EXTREMITY EVALUATION   Patient Name: Jill [name] MRN: [id number] DOB:[birth year], 73 y.o., female Today's Date: 07/29/2024  END OF SESSION:***   Past Medical History:  Diagnosis Date   Anxiety    ON PAXIL , XANAX    AVM (arteriovenous malformation) brain    s/p stent/coil   Blood transfusion    x 2   Cancer (HCC)    Left lung   COPD (chronic obstructive pulmonary disease) (HCC)    no inhaler, no oxygen   Coronary artery disease    Prior inferior MI with stent to RCA, s/p CABG in 2008   Depression    Dizziness    Dyspnea    with exertion, no oxygen   Dyspnea on exertion 10/02/2023   Encounter for antineoplastic immunotherapy 08/24/2021   Fatigue    Foot injury 06/01/2017   right - RESOLVED, no longer an issue per patient 05/18/21   GERD (gastroesophageal reflux disease)    Headache(784.0)    UNRUPTURED CEREBRAL ANEURYSM   Hyperlipidemia    Hypertension    Hypothyroidism    Infected cyst of skin 09/18/2013   Left knee pain 06/05/2012   Lung mass    Left lung   Myocardial infarction (HCC)    Normal nuclear stress test Ju;y 2012   No ischemia. EF 70%; fixed defect involving septum, inferoseptal and inferior wall   Pain, dental 02/06/2017   Poor dental hygiene    Retroperitoneal bleeding    Following cardiac cath   Tobacco abuse    Urine discoloration 09/18/2013   Urine incontinence 07/06/2020   UTI (lower urinary tract infection) 10/16/2013   Past Surgical History:  Procedure Laterality Date   CARDIAC CATHETERIZATION  01/02/2007   IT REVEALS MILD INFERIOR WALL HYPOKINESIS. THE EJECTION FRACTION IS AROUND 50%   COLONOSCOPY     CORONARY ARTERY BYPASS GRAFT  11/06/2006   LIMA to LAD, SVG to DX, SVG to LCX & SVG to OM 1 & 2, and SVG to PD   CORONARY STENT PLACEMENT     Remote past stent to RCA   EYE SURGERY Right    cataracts removed   HEMORRHOID SURGERY  11/07/1987   IR IMAGING GUIDED PORT INSERTION  10/04/2022   UPPER GI  ENDOSCOPY     VENTRICULOSTOMY  10/06/2011   Procedure: VENTRICULOSTOMY;  Surgeon: Rockey LITTIE Peru;  Location: [facility];  Service: Neurosurgery;  Laterality: Right;  Insertion of Ventriculostomy Catheter   VIDEO BRONCHOSCOPY WITH ENDOBRONCHIAL NAVIGATION Left 05/20/2021   Procedure: VIDEO BRONCHOSCOPY WITH ENDOBRONCHIAL NAVIGATION;  Surgeon: Brenna Adine LITTIE, DO;  Location: [facility] OR;  Service: Pulmonary;  Laterality: Left;   VIDEO BRONCHOSCOPY WITH ENDOBRONCHIAL ULTRASOUND Bilateral 05/20/2021   Procedure: VIDEO BRONCHOSCOPY WITH ENDOBRONCHIAL ULTRASOUND;  Surgeon: Brenna Adine LITTIE, DO;  Location: [facility] OR;  Service: Pulmonary;  Laterality: Bilateral;   WISDOM TOOTH EXTRACTION     Patient Active Problem List   Diagnosis Date Noted   Genetic testing 12/24/2023   Iron deficiency anemia 11/19/2023   Severe malnutrition 10/02/2023   Pleural effusion 10/02/2023   COPD (chronic obstructive pulmonary disease) (HCC) 08/28/2023   Sinusitis 08/28/2023   Gait difficulty 08/28/2023   Constipation 07/31/2023   Port-A-Cath in place 12/13/2022   Macrocytic anemia 10/26/2021   Chronic apical periodontitis 10/07/2021   Secondary dental caries associated with failed or defective dental restoration 10/07/2021   Teeth missing 10/07/2021   Primary malignant neoplasm of lung with metastasis to brain [facility]) 06/07/2021   Encounter for  antineoplastic chemotherapy 06/06/2021   Primary adenocarcinoma of left upper lobe of lung (HCC) 05/16/2021   History of aneurysm 04/12/2021   Screening mammography declined 07/07/2020   Depression 07/06/2020   Dental caries 08/21/2018   H/O measles 03/19/2018   GERD (gastroesophageal reflux disease) 06/01/2017   Prediabetes 02/06/2017   Tobacco abuse 06/05/2012   Coronary artery disease    Hyperlipidemia    Hypertension    Fatigue    Anxiety    Hypothyroidism     PCP: Anders Otto DASEN, MD   REFERRING PROVIDER: Anders Otto DASEN, MD   REFERRING DIAG: [phone]  (ICD-10-CM) - Chronic pain of left knee   THERAPY DIAG:  No diagnosis found.  Rationale for Evaluation and Treatment: Rehabilitation  ONSET DATE: ***  SUBJECTIVE:   SUBJECTIVE STATEMENT: ***  PERTINENT HISTORY: *** PAIN:  Are you having pain? Yes: NPRS scale: *** Pain location: *** Pain description: *** Aggravating factors: *** Relieving factors: ***  PRECAUTIONS: {Therapy precautions:24002}  RED FLAGS: {PT Red Flags:29287}   WEIGHT BEARING RESTRICTIONS: {Yes ***/No:24003}  FALLS:  Has patient fallen in last 6 months? {fallsyesno:27318}  LIVING ENVIRONMENT: Lives with: {OPRC lives with:25569::lives with their family} Lives in: {Lives in:25570} Stairs: {opstairs:27293} Has following equipment at home: {Assistive devices:23999}  OCCUPATION: ***  PLOF: {PLOF:24004}  PATIENT GOALS: ***  NEXT MD VISIT: ***  OBJECTIVE:  Note: Objective measures were completed at Evaluation unless otherwise noted.  DIAGNOSTIC FINDINGS: Xray 06/09/24 IMPRESSION: Mild tricompartmental osteoarthritis, most prominent in the medial tibiofemoral compartment.   PATIENT SURVEYS:  PSFS: THE PATIENT SPECIFIC FUNCTIONAL SCALE  Place score of 0-10 (0 = unable to perform activity and 10 = able to perform activity at the same level as before injury or problem)  Activity Date: 07/30/24***         2.     3.     4.      Total Score ***      Total Score = Sum of activity scores/number of activities  Minimally Detectable Change: 3 points (for single activity); 2 points (for average score)  [facility] (nd). The Patient Specific Functional Scale . Retrieved from [email or web address]   COGNITION: Overall cognitive status: Within functional limits for tasks assessed     SENSATION: {sensation:27233}  EDEMA:  {edema:24020}  MUSCLE LENGTH: Hamstrings: Right *** deg; Left *** deg Debby test: Right *** deg; Left ***  deg  POSTURE: {posture:25561}  PALPATION: ***  LOWER EXTREMITY ROM:  A/P ROM Right eval Left eval  Hip flexion    Hip extension    Hip abduction    Hip adduction    Hip internal rotation    Hip external rotation    Knee flexion  ***  Knee extension  ***  Ankle dorsiflexion    Ankle plantarflexion    Ankle inversion    Ankle eversion     (Blank rows = not tested)  LOWER EXTREMITY MMT:  MMT Right eval Left eval  Hip flexion  ***  Hip extension    Hip abduction    Hip adduction    Hip internal rotation    Hip external rotation    Knee flexion    Knee extension    Ankle dorsiflexion    Ankle plantarflexion    Ankle inversion    Ankle eversion     (Blank rows = not tested)  LOWER EXTREMITY SPECIAL TESTS:  {LEspecialtests:26242}  FUNCTIONAL TESTS:  5 times sit to stand: ***  GAIT: Distance walked: ***  Assistive device utilized: {Assistive devices:23999} Level of assistance: {Levels of assistance:24026} Comments: ***                                                                                                                                TREATMENT DATE:  07/30/24 Initial evaluation completed followed by trial set of HEP.      PATIENT EDUCATION:  Education details: HEP Person educated: {Person educated:25204} Education method: {Education Method:25205} Education comprehension: {Education Comprehension:25206}  HOME EXERCISE PROGRAM: ***  ASSESSMENT:  CLINICAL IMPRESSION: Patient is a 73 y.o. female who was seen today for physical therapy evaluation and treatment for left knee pain.  She presents with **** She would benefit from skilled PT services to address these deficits and return to ****   OBJECTIVE IMPAIRMENTS: {opptimpairments:25111}.   ACTIVITY LIMITATIONS: {activitylimitations:27494}  PARTICIPATION LIMITATIONS: {participationrestrictions:25113}  PERSONAL FACTORS: {Personal factors:25162} are also affecting patient's functional  outcome.   REHAB POTENTIAL: {rehabpotential:25112}  CLINICAL DECISION MAKING: {clinical decision making:25114}  EVALUATION COMPLEXITY: {Evaluation complexity:25115}   GOALS: Goals reviewed with patient? Yes  SHORT TERM GOALS: Target date: *** Pt to be independent with HEP. Baseline: Goal status: INITIAL  2.  Decrease pain by 1 level. Baseline:  Goal status: INITIAL  3.  *** Baseline:  Goal status: INITIAL  LONG TERM GOALS: Target date: ***  Pt to be independent with self progressive HEP at discharge. Baseline:  Goal status: INITIAL  2.  Increase AROM to *** Baseline:  Goal status: INITIAL  3.  Increase strength to *** Baseline:  Goal status: INITIAL  4.  Improve PSFS by at least 2 levels for measurable improvement. Baseline:  Goal status: INITIAL  5.  *** Baseline:  Goal status: INITIAL  6.  *** Baseline:  Goal status: INITIAL   PLAN:  PT FREQUENCY: {rehab frequency:25116}  PT DURATION: {rehab duration:25117}  PLANNED INTERVENTIONS: 97164- PT Re-evaluation, 97110-Therapeutic exercises, 97530- Therapeutic activity, 97112- Neuromuscular re-education, 97535- Self Care, 02859- Manual therapy, [id number]- Gait training, [phone]- Aquatic Therapy, [phone]- Electrical stimulation (unattended), Patient/Family education, Balance training, Stair training, Joint mobilization, Cryotherapy, and Moist heat  PLAN FOR NEXT SESSION: ***   Burnard CHRISTELLA Meth, PT 07/29/2024, 1:38 PM

## 2024-07-30 ENCOUNTER — Ambulatory Visit

## 2024-08-04 ENCOUNTER — Inpatient Hospital Stay: Payer: Self-pay

## 2024-08-04 ENCOUNTER — Ambulatory Visit (HOSPITAL_COMMUNITY)

## 2024-08-04 ENCOUNTER — Telehealth: Payer: Self-pay

## 2024-08-04 NOTE — Telephone Encounter (Signed)
 Patient called stating she wants to reschedule her port flush with lab and CT scan appointments scheduled for today due to a "belly ache." Contacted Tonya to reschedule the patient's CT scan. Once the CT scan is rescheduled, the port flush with lab appointment will be arranged prior to the scan. Patient voiced understanding.

## 2024-08-04 NOTE — Telephone Encounter (Signed)
 Spoke with patient and confirmed port flush with lab appt on 08/11/24 @ 2:30 P.M. and appt with Dr. Sherrod on 08/20/24 @ 3:30 P.M.

## 2024-08-06 ENCOUNTER — Other Ambulatory Visit: Payer: Self-pay | Admitting: Family Medicine

## 2024-08-11 ENCOUNTER — Inpatient Hospital Stay: Payer: Self-pay | Admitting: Internal Medicine

## 2024-08-11 ENCOUNTER — Ambulatory Visit (HOSPITAL_COMMUNITY)
Admission: RE | Admit: 2024-08-11 | Discharge: 2024-08-11 | Disposition: A | Discharge status: Home / Self Care | Source: Ambulatory Visit | Attending: Internal Medicine | Admitting: Internal Medicine

## 2024-08-11 ENCOUNTER — Inpatient Hospital Stay: Attending: Internal Medicine

## 2024-08-11 DIAGNOSIS — Z87440 Personal history of urinary (tract) infections: Secondary | ICD-10-CM | POA: Insufficient documentation

## 2024-08-11 DIAGNOSIS — E785 Hyperlipidemia, unspecified: Secondary | ICD-10-CM | POA: Insufficient documentation

## 2024-08-11 DIAGNOSIS — R262 Difficulty in walking, not elsewhere classified: Secondary | ICD-10-CM | POA: Insufficient documentation

## 2024-08-11 DIAGNOSIS — C7931 Secondary malignant neoplasm of brain: Secondary | ICD-10-CM | POA: Insufficient documentation

## 2024-08-11 DIAGNOSIS — D509 Iron deficiency anemia, unspecified: Secondary | ICD-10-CM

## 2024-08-11 DIAGNOSIS — C3490 Malignant neoplasm of unspecified part of unspecified bronchus or lung: Secondary | ICD-10-CM | POA: Insufficient documentation

## 2024-08-11 DIAGNOSIS — I252 Old myocardial infarction: Secondary | ICD-10-CM | POA: Insufficient documentation

## 2024-08-11 DIAGNOSIS — J8410 Pulmonary fibrosis, unspecified: Secondary | ICD-10-CM | POA: Insufficient documentation

## 2024-08-11 DIAGNOSIS — Z888 Allergy status to other drugs, medicaments and biological substances status: Secondary | ICD-10-CM | POA: Insufficient documentation

## 2024-08-11 DIAGNOSIS — F419 Anxiety disorder, unspecified: Secondary | ICD-10-CM | POA: Insufficient documentation

## 2024-08-11 DIAGNOSIS — I700 Atherosclerosis of aorta: Secondary | ICD-10-CM | POA: Insufficient documentation

## 2024-08-11 DIAGNOSIS — D539 Nutritional anemia, unspecified: Secondary | ICD-10-CM

## 2024-08-11 DIAGNOSIS — J439 Emphysema, unspecified: Secondary | ICD-10-CM | POA: Insufficient documentation

## 2024-08-11 DIAGNOSIS — Z9221 Personal history of antineoplastic chemotherapy: Secondary | ICD-10-CM | POA: Insufficient documentation

## 2024-08-11 DIAGNOSIS — K449 Diaphragmatic hernia without obstruction or gangrene: Secondary | ICD-10-CM | POA: Insufficient documentation

## 2024-08-11 DIAGNOSIS — J90 Pleural effusion, not elsewhere classified: Secondary | ICD-10-CM | POA: Insufficient documentation

## 2024-08-11 DIAGNOSIS — Z79890 Hormone replacement therapy (postmenopausal): Secondary | ICD-10-CM | POA: Insufficient documentation

## 2024-08-11 DIAGNOSIS — C3412 Malignant neoplasm of upper lobe, left bronchus or lung: Secondary | ICD-10-CM | POA: Insufficient documentation

## 2024-08-11 DIAGNOSIS — I10 Essential (primary) hypertension: Secondary | ICD-10-CM | POA: Insufficient documentation

## 2024-08-11 DIAGNOSIS — Z923 Personal history of irradiation: Secondary | ICD-10-CM | POA: Insufficient documentation

## 2024-08-11 DIAGNOSIS — Z8719 Personal history of other diseases of the digestive system: Secondary | ICD-10-CM | POA: Insufficient documentation

## 2024-08-11 DIAGNOSIS — Z882 Allergy status to sulfonamides status: Secondary | ICD-10-CM | POA: Insufficient documentation

## 2024-08-11 DIAGNOSIS — Z79899 Other long term (current) drug therapy: Secondary | ICD-10-CM | POA: Insufficient documentation

## 2024-08-11 DIAGNOSIS — Z880 Allergy status to penicillin: Secondary | ICD-10-CM | POA: Insufficient documentation

## 2024-08-11 DIAGNOSIS — I2510 Atherosclerotic heart disease of native coronary artery without angina pectoris: Secondary | ICD-10-CM | POA: Insufficient documentation

## 2024-08-11 DIAGNOSIS — E039 Hypothyroidism, unspecified: Secondary | ICD-10-CM | POA: Insufficient documentation

## 2024-08-11 DIAGNOSIS — Z7982 Long term (current) use of aspirin: Secondary | ICD-10-CM | POA: Insufficient documentation

## 2024-08-11 LAB — CMP (CANCER CENTER ONLY)
ALT: 8 U/L (ref 0–44)
AST: 14 U/L — ABNORMAL LOW (ref 15–41)
Albumin: 3.8 g/dL (ref 3.5–5.0)
Alkaline Phosphatase: 98 U/L (ref 38–126)
Anion gap: 4 — ABNORMAL LOW (ref 5–15)
BUN: 12 mg/dL (ref 8–23)
CO2: 33 mmol/L — ABNORMAL HIGH (ref 22–32)
Calcium: 9.5 mg/dL (ref 8.9–10.3)
Chloride: 102 mmol/L (ref 98–111)
Creatinine: 0.89 mg/dL (ref 0.44–1.00)
GFR, Estimated: >60 mL/min (ref >60)
Glucose, Bld: 104 mg/dL — ABNORMAL HIGH (ref 70–99)
Potassium: 3.7 mmol/L (ref 3.5–5.1)
Sodium: 139 mmol/L (ref 135–145)
Total Bilirubin: 0.4 mg/dL (ref 0.0–1.2)
Total Protein: 7.2 g/dL (ref 6.5–8.1)

## 2024-08-11 LAB — CBC WITH DIFFERENTIAL (CANCER CENTER ONLY)
Abs Immature Granulocytes: 0.02 K/uL (ref 0.00–0.07)
Basophils Absolute: 0.1 K/uL (ref 0.0–0.1)
Basophils Relative: 1 %
Eosinophils Absolute: 0.3 K/uL (ref 0.0–0.5)
Eosinophils Relative: 4 %
HCT: 39.6 % (ref 36.0–46.0)
Hemoglobin: 13 g/dL (ref 12.0–15.0)
Immature Granulocytes: 0 %
Lymphocytes Relative: 26 %
Lymphs Abs: 1.7 K/uL (ref 0.7–4.0)
MCH: 31.6 pg (ref 26.0–34.0)
MCHC: 32.8 g/dL (ref 30.0–36.0)
MCV: 96.4 fL (ref 80.0–100.0)
Monocytes Absolute: 0.8 K/uL (ref 0.1–1.0)
Monocytes Relative: 11 %
Neutro Abs: 3.9 K/uL (ref 1.7–7.7)
Neutrophils Relative %: 58 %
Platelet Count: 281 K/uL (ref 150–400)
RBC: 4.11 MIL/uL (ref 3.87–5.11)
RDW: 13.9 % (ref 11.5–15.5)
WBC Count: 6.8 K/uL (ref 4.0–10.5)
nRBC: 0 % (ref 0.0–0.2)

## 2024-08-11 MED ORDER — SODIUM CHLORIDE (PF) 0.9 % IJ SOLN
INTRAMUSCULAR | Status: AC
  Filled 2024-08-11: qty 50

## 2024-08-11 MED ORDER — HEPARIN SOD (PORK) LOCK FLUSH 100 UNIT/ML IV SOLN
500.0000 [IU] | Freq: Once | INTRAVENOUS | Status: AC
  Administered 2024-08-11: 500 [IU] via INTRAVENOUS

## 2024-08-11 MED ORDER — IOHEXOL 300 MG/ML  SOLN
100.0000 mL | Freq: Once | INTRAMUSCULAR | Status: AC | PRN
Start: 2024-08-11 — End: 2024-08-11
  Administered 2024-08-11: 100 mL via INTRAVENOUS

## 2024-08-18 ENCOUNTER — Telehealth: Payer: Self-pay

## 2024-08-18 NOTE — Telephone Encounter (Signed)
 Spoke with patient in regards to upcoming appt.  She inquired if she could have a telephone/virtual visit for her appt.  Informed her that since the government is shut down, medicare/medicaid patients cannot have a telephone or virtual visit. Patient confirmed appt on 08/20/24 @ 330 P.M.

## 2024-08-20 ENCOUNTER — Inpatient Hospital Stay (HOSPITAL_BASED_OUTPATIENT_CLINIC_OR_DEPARTMENT_OTHER): Payer: Self-pay | Admitting: Internal Medicine

## 2024-08-20 ENCOUNTER — Inpatient Hospital Stay: Payer: Self-pay | Admitting: Internal Medicine

## 2024-08-20 VITALS — BP 112/68 | HR 67 | Temp 97.4°F | Resp 17 | Ht 66.0 in | Wt 128.5 lb

## 2024-08-20 DIAGNOSIS — Z882 Allergy status to sulfonamides status: Secondary | ICD-10-CM | POA: Diagnosis not present

## 2024-08-20 DIAGNOSIS — Z9221 Personal history of antineoplastic chemotherapy: Secondary | ICD-10-CM | POA: Insufficient documentation

## 2024-08-20 DIAGNOSIS — Z7982 Long term (current) use of aspirin: Secondary | ICD-10-CM | POA: Diagnosis not present

## 2024-08-20 DIAGNOSIS — C3412 Malignant neoplasm of upper lobe, left bronchus or lung: Secondary | ICD-10-CM | POA: Insufficient documentation

## 2024-08-20 DIAGNOSIS — I2510 Atherosclerotic heart disease of native coronary artery without angina pectoris: Secondary | ICD-10-CM | POA: Diagnosis not present

## 2024-08-20 DIAGNOSIS — J8410 Pulmonary fibrosis, unspecified: Secondary | ICD-10-CM | POA: Diagnosis not present

## 2024-08-20 DIAGNOSIS — K449 Diaphragmatic hernia without obstruction or gangrene: Secondary | ICD-10-CM | POA: Insufficient documentation

## 2024-08-20 DIAGNOSIS — E785 Hyperlipidemia, unspecified: Secondary | ICD-10-CM | POA: Insufficient documentation

## 2024-08-20 DIAGNOSIS — E039 Hypothyroidism, unspecified: Secondary | ICD-10-CM | POA: Insufficient documentation

## 2024-08-20 DIAGNOSIS — J90 Pleural effusion, not elsewhere classified: Secondary | ICD-10-CM | POA: Insufficient documentation

## 2024-08-20 DIAGNOSIS — Z79890 Hormone replacement therapy (postmenopausal): Secondary | ICD-10-CM | POA: Diagnosis not present

## 2024-08-20 DIAGNOSIS — R262 Difficulty in walking, not elsewhere classified: Secondary | ICD-10-CM | POA: Diagnosis not present

## 2024-08-20 DIAGNOSIS — Z888 Allergy status to other drugs, medicaments and biological substances status: Secondary | ICD-10-CM | POA: Diagnosis not present

## 2024-08-20 DIAGNOSIS — Z8719 Personal history of other diseases of the digestive system: Secondary | ICD-10-CM | POA: Diagnosis not present

## 2024-08-20 DIAGNOSIS — Z880 Allergy status to penicillin: Secondary | ICD-10-CM | POA: Insufficient documentation

## 2024-08-20 DIAGNOSIS — I252 Old myocardial infarction: Secondary | ICD-10-CM | POA: Diagnosis not present

## 2024-08-20 DIAGNOSIS — F419 Anxiety disorder, unspecified: Secondary | ICD-10-CM | POA: Insufficient documentation

## 2024-08-20 DIAGNOSIS — Z87440 Personal history of urinary (tract) infections: Secondary | ICD-10-CM | POA: Diagnosis not present

## 2024-08-20 DIAGNOSIS — Z79899 Other long term (current) drug therapy: Secondary | ICD-10-CM | POA: Insufficient documentation

## 2024-08-20 DIAGNOSIS — I700 Atherosclerosis of aorta: Secondary | ICD-10-CM | POA: Insufficient documentation

## 2024-08-20 DIAGNOSIS — C7931 Secondary malignant neoplasm of brain: Secondary | ICD-10-CM | POA: Insufficient documentation

## 2024-08-20 DIAGNOSIS — I10 Essential (primary) hypertension: Secondary | ICD-10-CM | POA: Diagnosis not present

## 2024-08-20 DIAGNOSIS — C3490 Malignant neoplasm of unspecified part of unspecified bronchus or lung: Secondary | ICD-10-CM | POA: Diagnosis not present

## 2024-08-20 DIAGNOSIS — J439 Emphysema, unspecified: Secondary | ICD-10-CM | POA: Insufficient documentation

## 2024-08-20 DIAGNOSIS — Z923 Personal history of irradiation: Secondary | ICD-10-CM | POA: Insufficient documentation

## 2024-08-20 NOTE — Progress Notes (Signed)
 [facility] [phone]   [phone]  OFFICE PROGRESS NOTE  Jill Otto DASEN, MD [address] [place] [address]  DIAGNOSIS: Stage IV (T2 a, N2, M1b) non-small cell lung cancer, adenocarcinoma presented with left upper lobe lung mass in addition to left hilar and precarinal metastatic adenopathy and small left lower lobe pulmonary nodule in addition to solitary left frontal brain metastasis diagnosed in July 2022.   Molecular studies by [employer] showed  Positive KRAS G12C mutation PD-L1 expression was less than 1%   PRIOR THERAPY: 1) Status post SRS to the solitary brain metastasis. 2) Treatment for the locally advanced disease in the chest with carboplatin  for AUC of 2 and paclitaxel  45 Mg/M2.  Status post 6 cycles.  Last dose was given 07/25/2021. 3) Systemic chemotherapy with carboplatin  for AUC of 5, Alimta  500 Mg/M2 and Keytruda  200 Mg IV every 3 weeks.  First dose August 31, 2021.  Status post 33 cycles.  Starting from cycle #5 the patient will be on maintenance treatment with Alimta  and Keytruda  every 3 weeks.  Starting from cycle #30 she will be on single agent Keytruda  200 Mg IV every 3 weeks.  Alimta  was discontinued secondary to intolerance.   CURRENT THERAPY: Observation   INTERVAL HISTORY: Jill [name] 73 y.o. female returns to the clinic today for follow-up visit accompanied by her daughter. Discussed the use of AI scribe software for clinical note transcription with the patient, who gave verbal consent to proceed.  History of Present Illness Jill [name] is a 73 year old female with stage four non-small cell lung cancer who presents for restaging of her disease. She is accompanied by her daughter.  She was diagnosed with stage four non-small cell lung cancer, adenocarcinoma, in July 2022, with a positive KRAS G12C mutation and PD-L1 expression of zero. Her lung cancer was diagnosed in July 2022. She does not  recall the exact details of her initial diagnosis. She completed 2 years of treatment initially with carboplatin , Alimta  and Keytruda  followed by maintenance treatment with Alimta  and Keytruda  then single agent Keytruda  for a total of 3 years.  She has been in observation since that time. She experiences fatigue and difficulty walking. She also has dyspnea and coughs up clear mucus. She suspects she might need a thoracic incision.  She notes that her knees are problematic, although she does not specify the nature of the issue.     MEDICAL HISTORY: Past Medical History:  Diagnosis Date   Anxiety    ON PAXIL , XANAX    AVM (arteriovenous malformation) brain    s/p stent/coil   Blood transfusion    x 2   Cancer (HCC)    Left lung   COPD (chronic obstructive pulmonary disease) (HCC)    no inhaler, no oxygen   Coronary artery disease    Prior inferior MI with stent to RCA, s/p CABG in 2008   Depression    Dizziness    Dyspnea    with exertion, no oxygen   Dyspnea on exertion 10/02/2023   Encounter for antineoplastic immunotherapy 08/24/2021   Fatigue    Foot injury 06/01/2017   right - RESOLVED, no longer an issue per patient 05/18/21   GERD (gastroesophageal reflux disease)    Headache(784.0)    UNRUPTURED CEREBRAL ANEURYSM   Hyperlipidemia    Hypertension    Hypothyroidism    Infected cyst of skin 09/18/2013   Left knee pain 06/05/2012  Lung mass    Left lung   Myocardial infarction [facility])    Normal nuclear stress test Ju;y 2012   No ischemia. EF 70%; fixed defect involving septum, inferoseptal and inferior wall   Pain, dental 02/06/2017   Poor dental hygiene    Retroperitoneal bleeding    Following cardiac cath   Tobacco abuse    Urine discoloration 09/18/2013   Urine incontinence 07/06/2020   UTI (lower urinary tract infection) 10/16/2013    ALLERGIES:  is allergic to varenicline, ace inhibitors, prednisone, betalin 12  [vitamin b12], penicillins, and sulfa drugs  cross reactors.  MEDICATIONS:  Current Outpatient Medications  Medication Sig Dispense Refill   acetaminophen  (TYLENOL ) 500 MG tablet Take 500-1,000 mg by mouth every 6 (six) hours as needed for moderate pain (pain score 4-6).     albuterol  (PROVENTIL ) (2.5 MG/3ML) 0.083% nebulizer solution Take 3 mLs (2.5 mg total) by nebulization every 6 (six) hours as needed for wheezing or shortness of breath. 75 mL 12   albuterol  (VENTOLIN  HFA) 108 (90 Base) MCG/ACT inhaler Inhale 2 puffs into the lungs every 6 (six) hours as needed for wheezing or shortness of breath. 8 g 2   amLODipine  (NORVASC ) 10 MG tablet Take 1 tablet (10 mg total) by mouth daily. 90 tablet 1   aspirin  EC 81 MG tablet Take 1 tablet (81 mg total) by mouth daily. 90 tablet 2   atorvastatin  (LIPITOR) 40 MG tablet TAKE 1 TABLET BY MOUTH EVERY DAY 90 tablet 1   azithromycin  (ZITHROMAX  Z-PAK) 250 MG tablet Take 2 tablets today and then one tablet daily for 4 days. 6 each 0   budeson-glycopyrrolate -formoterol  (BREZTRI  AEROSPHERE) 160-9-4.8 MCG/ACT AERO inhaler Inhale 2 puffs into the lungs in the morning and at bedtime. 3 each 2   Cholecalciferol (VITAMIN D3 PO) Take 1 tablet by mouth daily.     clonazePAM  (KLONOPIN ) 0.5 MG tablet TAKE 1 TABLET BY MOUTH 2 TIMES DAILY AS NEEDED FOR ANXIETY. 60 tablet 2   escitalopram  (LEXAPRO ) 10 MG tablet TAKE 1 TABLET BY MOUTH EVERY DAY 90 tablet 1   ferrous sulfate  324 MG TBEC Take 1 tablet (324 mg total) by mouth daily with breakfast. 90 tablet 1   folic acid  (FOLVITE ) 1 MG tablet TAKE 1 TABLET BY MOUTH EVERY DAY 90 tablet 0   ibuprofen  (ADVIL ) 400 MG tablet Take 1 tablet (400 mg total) by mouth every 12 (twelve) hours as needed. 60 tablet 1   lactose free nutrition (BOOST PLUS) LIQD Take 237 mLs by mouth 2 (two) times daily between meals. 42660 mL 2   levothyroxine  (SYNTHROID ) 100 MCG tablet TAKE 1 TABLET (100 MCG TOTAL) BY MOUTH EVERY MORNING. 30 MINUTES BEFORE FOOD 90 tablet 1   lidocaine -prilocaine   (EMLA ) cream Apply 1 Application topically as needed. 30 g 2   metoprolol  succinate (TOPROL -XL) 50 MG 24 hr tablet TAKE 1 TABLET BY MOUTH ONCE DAILY WITH MEALS OR IMMEDIATLEY AFTER A MEAL 90 tablet 1   Multiple Vitamins-Minerals (MULTIVITAMIN WITH MINERALS) tablet Take 1 tablet by mouth daily.     nitroGLYCERIN  (NITROSTAT ) 0.4 MG SL tablet Place 1 tablet (0.4 mg total) under the tongue every 5 (five) minutes as needed. For chest pain 25 tablet 6   prochlorperazine  (COMPAZINE ) 10 MG tablet TAKE 1 TABLET BY MOUTH EVERY 6 HOURS AS NEEDED NAUSEA AND VOMITING 30 tablet 0   senna ([employer] SENNA) 8.6 MG tablet TAKE 1 TABLET (8.6 MG TOTAL) BY MOUTH 2 (TWO) TIMES DAILY AS NEEDED FOR MILD  CONSTIPATION. 60 tablet 1   VITAMIN A PO Take 1 tablet by mouth daily.     No current facility-administered medications for this visit.    SURGICAL HISTORY:  Past Surgical History:  Procedure Laterality Date   CARDIAC CATHETERIZATION  01/02/2007   IT REVEALS MILD INFERIOR WALL HYPOKINESIS. THE EJECTION FRACTION IS AROUND 50%   COLONOSCOPY     CORONARY ARTERY BYPASS GRAFT  11/06/2006   LIMA to LAD, SVG to DX, SVG to LCX & SVG to OM 1 & 2, and SVG to PD   CORONARY STENT PLACEMENT     Remote past stent to RCA   EYE SURGERY Right    cataracts removed   HEMORRHOID SURGERY  11/07/1987   IR IMAGING GUIDED PORT INSERTION  10/04/2022   UPPER GI ENDOSCOPY     VENTRICULOSTOMY  10/06/2011   Procedure: VENTRICULOSTOMY;  Surgeon: Rockey LITTIE Peru;  Location: [facility];  Service: Neurosurgery;  Laterality: Right;  Insertion of Ventriculostomy Catheter   VIDEO BRONCHOSCOPY WITH ENDOBRONCHIAL NAVIGATION Left 05/20/2021   Procedure: VIDEO BRONCHOSCOPY WITH ENDOBRONCHIAL NAVIGATION;  Surgeon: Brenna Adine LITTIE, DO;  Location: [facility] OR;  Service: Pulmonary;  Laterality: Left;   VIDEO BRONCHOSCOPY WITH ENDOBRONCHIAL ULTRASOUND Bilateral 05/20/2021   Procedure: VIDEO BRONCHOSCOPY WITH ENDOBRONCHIAL ULTRASOUND;  Surgeon: Brenna Adine LITTIE, DO;   Location: [facility] OR;  Service: Pulmonary;  Laterality: Bilateral;   WISDOM TOOTH EXTRACTION      REVIEW OF SYSTEMS:  Constitutional: positive for fatigue Eyes: negative Ears, nose, mouth, throat, and face: negative Respiratory: positive for cough and dyspnea on exertion Cardiovascular: negative Gastrointestinal: negative Genitourinary:negative Integument/breast: negative Hematologic/lymphatic: negative Musculoskeletal:negative Neurological: negative Behavioral/Psych: negative Endocrine: negative Allergic/Immunologic: negative   PHYSICAL EXAMINATION: General appearance: alert, cooperative, fatigued, and no distress Head: Normocephalic, without obvious abnormality, atraumatic Neck: no adenopathy, no JVD, supple, symmetrical, trachea midline, and thyroid  not enlarged, symmetric, no tenderness/mass/nodules Lymph nodes: Cervical, supraclavicular, and axillary nodes normal. Resp: clear to auscultation bilaterally Back: symmetric, no curvature. ROM normal. No CVA tenderness. Cardio: regular rate and rhythm, S1, S2 normal, no murmur, click, rub or gallop GI: soft, non-tender; bowel sounds normal; no masses,  no organomegaly Extremities: extremities normal, atraumatic, no cyanosis or edema Neurologic: Alert and oriented X 3, normal strength and tone. Normal symmetric reflexes. Normal coordination and gait  ECOG PERFORMANCE STATUS: 1 - Symptomatic but completely ambulatory  Blood pressure 112/68, pulse 67, temperature (!) 97.4 F (36.3 C), resp. rate 17, height 5' 6 (1.676 m), weight 128 lb 8 oz (58.3 kg), SpO2 91%.  LABORATORY DATA: Lab Results  Component Value Date   WBC 6.8 08/11/2024   HGB 13.0 08/11/2024   HCT 39.6 08/11/2024   MCV 96.4 08/11/2024   PLT 281 08/11/2024      Chemistry      Component Value Date/Time   NA 139 08/11/2024 1431   NA 141 04/12/2021 1356   K 3.7 08/11/2024 1431   CL 102 08/11/2024 1431   CO2 33 (H) 08/11/2024 1431   BUN 12 08/11/2024 1431   BUN  13 04/12/2021 1356   CREATININE 0.89 08/11/2024 1431   CREATININE 0.99 08/29/2016 1130      Component Value Date/Time   CALCIUM  9.5 08/11/2024 1431   ALKPHOS 98 08/11/2024 1431   AST 14 (L) 08/11/2024 1431   ALT 8 08/11/2024 1431   BILITOT 0.4 08/11/2024 1431       RADIOGRAPHIC STUDIES: CT CHEST ABDOMEN PELVIS W CONTRAST Result Date: 08/14/2024 CLINICAL DATA:  Non-small-cell lung  cancer restaging * Tracking Code: BO * EXAM: CT CHEST, ABDOMEN, AND PELVIS WITH CONTRAST TECHNIQUE: Multidetector CT imaging of the chest, abdomen and pelvis was performed following the standard protocol during bolus administration of intravenous contrast. RADIATION DOSE REDUCTION: This exam was performed according to the departmental dose-optimization program which includes automated exposure control, adjustment of the mA and/or kV according to patient size and/or use of iterative reconstruction technique. CONTRAST:  OMNIPAQUE  IOHEXOL  300 MG/ML  SOLN COMPARISON:  04/29/2024 FINDINGS: CT CHEST FINDINGS Cardiovascular: Right chest port catheter. Aortic atherosclerosis. Normal heart size. Three-vessel coronary artery calcifications. No pericardial effusion. Mediastinum/Nodes: No enlarged mediastinal, hilar, or axillary lymph nodes. Small hiatal hernia. Thyroid  gland, trachea, and esophagus demonstrate no significant findings. Lungs/Pleura: Unchanged post treatment appearance of the chest with dense fibrosis and consolidation of the perihilar left upper lobe (series 4, image 59) with associated volume loss of the left hemithorax and a small, chronically loculated left pleural effusion as well as rounded atelectasis of the left lung base. Interval enlargement of a spiculated nodular opacity in the right upper lobe, on today's examination measuring 1.2 x 1.1 cm, previously no greater than 0.8 x 0.6 cm (series 4, image 49). Unchanged subsolid nodule in the lateral segment right middle lobe measuring 0.9 cm (series 4, image  71). Severe emphysema. No pleural effusion or pneumothorax. Musculoskeletal: No chest wall abnormality. No acute osseous findings. CT ABDOMEN PELVIS FINDINGS Hepatobiliary: No solid liver abnormality is seen. No gallstones, gallbladder wall thickening, or biliary dilatation. Pancreas: Unremarkable. No pancreatic ductal dilatation or surrounding inflammatory changes. Spleen: Normal in size without significant abnormality. Adrenals/Urinary Tract: Adrenal glands are unremarkable. Kidneys are normal, without renal calculi, solid lesion, or hydronephrosis. Bladder is unremarkable. Stomach/Bowel: Stomach is within normal limits. Appendix appears normal. No evidence of bowel wall thickening, distention, or inflammatory changes. Vascular/Lymphatic: Severe aortic atherosclerosis. No enlarged abdominal or pelvic lymph nodes. Reproductive: No mass or other abnormality. Other: No abdominal wall hernia or abnormality. No ascites. Musculoskeletal: No acute osseous findings. IMPRESSION: 1. Unchanged post treatment appearance of the left chest with dense fibrosis and consolidation of the perihilar left upper lobe, associated volume loss of the left hemithorax and a small, chronically loculated left pleural effusion with rounded atelectasis of the left lung base. 2. Interval enlargement of a spiculated nodular opacity in the right upper lobe, on today's examination measuring 1.2 x 1.1 cm, previously no greater than 0.8 x 0.6 cm. This is highly concerning for a new metachronous primary lung malignancy. 3. Unchanged subsolid nodule in the lateral segment right middle lobe measuring 0.9 cm. Attention on follow-up. 4. No evidence of lymphadenopathy or metastatic disease in the abdomen or pelvis. 5. Emphysema. 6. Coronary artery disease. Aortic Atherosclerosis (ICD10-I70.0) and Emphysema (ICD10-J43.9). Electronically Signed   By: Marolyn JONETTA Jaksch M.D.   On: 08/14/2024 09:43    ASSESSMENT AND PLAN: This is a very pleasant 73 years old  white female diagnosed with a stage IV (T2 a, N2, M1 B) non-small cell lung cancer, adenocarcinoma presented with left upper lobe lung mass in addition to left hilar and precarinal metastatic adenopathy as well as small left lower lobe pulmonary nodule and solitary left frontal brain metastasis diagnosed in July 2022.  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 The patient is status post SRS to the solitary brain metastasis. She underwent a course of concurrent chemoradiation to the locally advanced disease in the chest with carboplatin  for AUC of 2 and paclitaxel   45 Mg/M2 status post 7 cycles.  The patient has been tolerating her treatment well with no concerning adverse effects. Her scan showed mild improvement of her disease with no concerning findings for progression. Technically the patient has a stage IV lung cancer with brain metastasis  The patient is currently undergoing systemic chemotherapy with carboplatin  for AUC of 5, Alimta  500 Mg/M2 and Keytruda  200 Mg IV every 3 weeks status post 33 cycles.  Starting from cycle #5 the patient will be on maintenance treatment with Alimta  and Keytruda  every 3 weeks.  Starting from cycle #30 she will be on treatment with single agent Keytruda  every 3 weeks.  Alimta  was discontinued secondary to increasing fatigue and weakness and intolerance.  She is currently on observation. She had repeat CT scan of the chest, abdomen and pelvis performed recently.  I personally independently reviewed the scan images and discussed the result and showed the images to the patient and her daughter. Her scan showed no concerning findings for disease progression except for slight increase in the size of spiculated nodule in the right upper lobe.   Assessment and Plan Assessment & Plan Stage IV non-small cell lung cancer, adenocarcinoma with right lung nodule Stage IV non-small cell lung cancer, adenocarcinoma with a new nodule  in the right lung. The nodule has shown slight growth compared to previous imaging. The possibility of cancer is high, but the nodule is still small. Immediate intervention is not deemed necessary as the risks of radiation, such as scarring and potential impact on breathing, outweigh the benefits at this time. - Schedule follow-up scan in three months to monitor the nodule. - Consider radiation therapy if the nodule continues to grow.  Dyspnea Dyspnea is present, likely related to the underlying lung cancer. There is a tiny amount of fluid in the left lung, but it is not significant enough to warrant drainage as it could cause more harm than benefit.  Cough with clear sputum Cough with clear sputum is likely related to the lung cancer and associated respiratory issues.  Follow-Up Follow-up is necessary to monitor the progression of the lung nodule and overall condition. - Arrange follow-up appointment in three months with repeat imaging to assess the right lung nodule. She was advised to call immediately if she has any other concerning symptoms in the interval. The patient voices understanding of current disease status and treatment options and is in agreement with the current care plan.  All questions were answered. The patient knows to call the clinic with any problems, questions or concerns. We can certainly see the patient much sooner if necessary. The total time spent in the appointment was 30 minutes including review of chart and various tests results, discussions about plan of care and coordination of care plan .  Disclaimer: This note was dictated with voice recognition software. Similar sounding words can inadvertently be transcribed and may not be corrected upon review.

## 2024-08-22 ENCOUNTER — Telehealth: Payer: Self-pay | Admitting: Internal Medicine

## 2024-08-22 NOTE — Telephone Encounter (Signed)
 Left a voicemail with the scheduled appointment details.

## 2024-09-08 ENCOUNTER — Other Ambulatory Visit: Payer: Self-pay | Admitting: Family Medicine

## 2024-09-17 ENCOUNTER — Other Ambulatory Visit: Payer: Self-pay | Admitting: Family Medicine

## 2024-09-23 ENCOUNTER — Inpatient Hospital Stay: Payer: Self-pay

## 2024-09-23 ENCOUNTER — Telehealth: Payer: Self-pay

## 2024-09-23 NOTE — Telephone Encounter (Signed)
 Patient called and stated that she can not come today for port flush with lab appt.  Rescheduled patient for tomorrow at 3:30 PM. She voiced understanding.

## 2024-09-24 ENCOUNTER — Inpatient Hospital Stay

## 2024-09-26 ENCOUNTER — Telehealth: Payer: Self-pay

## 2024-09-26 NOTE — Telephone Encounter (Signed)
 Spoke with patient in regards to missed port flush with lab appt on 09/24/24. Rescheduled patient for 09/30/24 @ 315 PM. She voiced understanding.

## 2024-09-28 ENCOUNTER — Other Ambulatory Visit: Payer: Self-pay | Admitting: Family Medicine

## 2024-09-28 DIAGNOSIS — E785 Hyperlipidemia, unspecified: Secondary | ICD-10-CM

## 2024-09-30 ENCOUNTER — Inpatient Hospital Stay: Attending: Internal Medicine

## 2024-09-30 DIAGNOSIS — D509 Iron deficiency anemia, unspecified: Secondary | ICD-10-CM

## 2024-09-30 DIAGNOSIS — C3412 Malignant neoplasm of upper lobe, left bronchus or lung: Secondary | ICD-10-CM | POA: Insufficient documentation

## 2024-09-30 DIAGNOSIS — Z79899 Other long term (current) drug therapy: Secondary | ICD-10-CM | POA: Insufficient documentation

## 2024-09-30 DIAGNOSIS — D539 Nutritional anemia, unspecified: Secondary | ICD-10-CM

## 2024-09-30 LAB — CBC WITH DIFFERENTIAL (CANCER CENTER ONLY)
Abs Immature Granulocytes: 0.01 K/uL (ref 0.00–0.07)
Basophils Absolute: 0.1 K/uL (ref 0.0–0.1)
Basophils Relative: 1 %
Eosinophils Absolute: 0.2 K/uL (ref 0.0–0.5)
Eosinophils Relative: 3 %
HCT: 39.9 % (ref 36.0–46.0)
Hemoglobin: 13.1 g/dL (ref 12.0–15.0)
Immature Granulocytes: 0 %
Lymphocytes Relative: 20 %
Lymphs Abs: 1.5 K/uL (ref 0.7–4.0)
MCH: 31.8 pg (ref 26.0–34.0)
MCHC: 32.8 g/dL (ref 30.0–36.0)
MCV: 96.8 fL (ref 80.0–100.0)
Monocytes Absolute: 0.6 K/uL (ref 0.1–1.0)
Monocytes Relative: 9 %
Neutro Abs: 5 K/uL (ref 1.7–7.7)
Neutrophils Relative %: 67 %
Platelet Count: 270 K/uL (ref 150–400)
RBC: 4.12 MIL/uL (ref 3.87–5.11)
RDW: 13.9 % (ref 11.5–15.5)
WBC Count: 7.4 K/uL (ref 4.0–10.5)
nRBC: 0 % (ref 0.0–0.2)

## 2024-10-18 ENCOUNTER — Other Ambulatory Visit: Payer: Self-pay | Admitting: Family Medicine

## 2024-10-20 NOTE — Telephone Encounter (Signed)
 Patient calls nurse line regarding rx refills.   She is also asking about Klonopin  refill.   Advised that she picked up 30 day supply on 10/09/24, which would be too soon for refill.   Recommended that patient schedule follow up for controlled substance refill.   Scheduled on 11/03/24.  Chiquita JAYSON English, RN

## 2024-11-03 ENCOUNTER — Ambulatory Visit: Admitting: Family Medicine

## 2024-11-04 ENCOUNTER — Other Ambulatory Visit: Payer: Self-pay | Admitting: Family Medicine

## 2024-11-04 ENCOUNTER — Telehealth: Payer: Self-pay | Admitting: Internal Medicine

## 2024-11-04 DIAGNOSIS — I10 Essential (primary) hypertension: Secondary | ICD-10-CM

## 2024-11-04 MED ORDER — CLONAZEPAM 0.5 MG PO TABS: 0.5000 mg | ORAL_TABLET | Freq: Two times a day (BID) | ORAL | 2 refills | Status: AC | PRN

## 2024-11-04 MED ORDER — IBUPROFEN 400 MG PO TABS: 400.0000 mg | ORAL_TABLET | Freq: Two times a day (BID) | ORAL | 1 refills | Status: AC | PRN

## 2024-11-04 MED ORDER — AMLODIPINE BESYLATE 10 MG PO TABS: 10.0000 mg | ORAL_TABLET | Freq: Every day | ORAL | 1 refills | Status: AC

## 2024-11-04 MED ORDER — METOPROLOL SUCCINATE ER 50 MG PO TB24: ORAL_TABLET | ORAL | 1 refills | Status: AC

## 2024-11-04 NOTE — Telephone Encounter (Signed)
 Left a voice message on the pts phone informing them of the changed appt

## 2024-11-11 ENCOUNTER — Inpatient Hospital Stay

## 2024-11-11 ENCOUNTER — Telehealth: Payer: Self-pay | Admitting: Medical Oncology

## 2024-11-11 NOTE — Telephone Encounter (Signed)
 Pt had flu and could not keep scan appt. She will call and r/s scan and let us  know so we can r/s port flush with labs and f/u with [facility].

## 2024-11-13 ENCOUNTER — Telehealth: Payer: Self-pay

## 2024-11-13 NOTE — Telephone Encounter (Signed)
 Spoke with patient in regards to scheduling CT scan for 11/21/24 @ 4 PM.  Made patient a port flush with lab appt prior to CT scan @ 3 PM and follow up with Dr. Sherrod on 12/09/24 @ 345 PM.  She voiced understanding.

## 2024-11-14 ENCOUNTER — Encounter: Payer: Self-pay | Admitting: Internal Medicine

## 2024-11-17 ENCOUNTER — Inpatient Hospital Stay: Admitting: Internal Medicine

## 2024-11-18 ENCOUNTER — Inpatient Hospital Stay: Admitting: Internal Medicine

## 2024-11-20 ENCOUNTER — Other Ambulatory Visit: Payer: Self-pay | Admitting: Internal Medicine

## 2024-11-20 DIAGNOSIS — C3412 Malignant neoplasm of upper lobe, left bronchus or lung: Secondary | ICD-10-CM

## 2024-11-21 ENCOUNTER — Inpatient Hospital Stay: Attending: Internal Medicine

## 2024-11-21 ENCOUNTER — Ambulatory Visit (HOSPITAL_COMMUNITY)
Admission: RE | Admit: 2024-11-21 | Discharge: 2024-11-21 | Disposition: A | Discharge status: Home / Self Care | Source: Ambulatory Visit | Attending: Internal Medicine | Admitting: Internal Medicine

## 2024-11-21 DIAGNOSIS — C3412 Malignant neoplasm of upper lobe, left bronchus or lung: Secondary | ICD-10-CM | POA: Diagnosis present

## 2024-11-21 DIAGNOSIS — Z79899 Other long term (current) drug therapy: Secondary | ICD-10-CM | POA: Diagnosis not present

## 2024-11-21 DIAGNOSIS — R059 Cough, unspecified: Secondary | ICD-10-CM | POA: Diagnosis not present

## 2024-11-21 DIAGNOSIS — C3490 Malignant neoplasm of unspecified part of unspecified bronchus or lung: Secondary | ICD-10-CM

## 2024-11-21 DIAGNOSIS — R0600 Dyspnea, unspecified: Secondary | ICD-10-CM | POA: Insufficient documentation

## 2024-11-21 LAB — CBC WITH DIFFERENTIAL (CANCER CENTER ONLY)
Abs Immature Granulocytes: 0.01 K/uL (ref 0.00–0.07)
Basophils Absolute: 0.1 K/uL (ref 0.0–0.1)
Basophils Relative: 1 %
Eosinophils Absolute: 0.2 K/uL (ref 0.0–0.5)
Eosinophils Relative: 3 %
HCT: 41 % (ref 36.0–46.0)
Hemoglobin: 13.7 g/dL (ref 12.0–15.0)
Immature Granulocytes: 0 %
Lymphocytes Relative: 21 %
Lymphs Abs: 1.5 K/uL (ref 0.7–4.0)
MCH: 32.3 pg (ref 26.0–34.0)
MCHC: 33.4 g/dL (ref 30.0–36.0)
MCV: 96.7 fL (ref 80.0–100.0)
Monocytes Absolute: 0.7 K/uL (ref 0.1–1.0)
Monocytes Relative: 9 %
Neutro Abs: 4.8 K/uL (ref 1.7–7.7)
Neutrophils Relative %: 66 %
Platelet Count: 266 K/uL (ref 150–400)
RBC: 4.24 MIL/uL (ref 3.87–5.11)
RDW: 13.3 % (ref 11.5–15.5)
WBC Count: 7.2 K/uL (ref 4.0–10.5)
nRBC: 0 % (ref 0.0–0.2)

## 2024-11-21 LAB — CMP (CANCER CENTER ONLY)
ALT: 8 U/L (ref 0–44)
AST: 21 U/L (ref 15–41)
Albumin: 4 g/dL (ref 3.5–5.0)
Alkaline Phosphatase: 93 U/L (ref 38–126)
Anion gap: 10 (ref 5–15)
BUN: 11 mg/dL (ref 8–23)
CO2: 27 mmol/L (ref 22–32)
Calcium: 9.3 mg/dL (ref 8.9–10.3)
Chloride: 102 mmol/L (ref 98–111)
Creatinine: 0.92 mg/dL (ref 0.44–1.00)
GFR, Estimated: >60 mL/min
Glucose, Bld: 111 mg/dL — ABNORMAL HIGH (ref 70–99)
Potassium: 3.5 mmol/L (ref 3.5–5.1)
Sodium: 139 mmol/L (ref 135–145)
Total Bilirubin: 0.6 mg/dL (ref 0.0–1.2)
Total Protein: 7.4 g/dL (ref 6.5–8.1)

## 2024-11-21 MED ORDER — IOHEXOL 300 MG/ML  SOLN
100.0000 mL | Freq: Once | INTRAMUSCULAR | Status: AC | PRN
  Administered 2024-11-21: 80 mL via INTRAVENOUS

## 2024-11-21 MED ORDER — HEPARIN SOD (PORK) LOCK FLUSH 100 UNIT/ML IV SOLN
INTRAVENOUS | Status: AC
  Filled 2024-11-21: qty 5

## 2024-11-21 MED ORDER — HEPARIN SOD (PORK) LOCK FLUSH 100 UNIT/ML IV SOLN
500.0000 [IU] | Freq: Once | INTRAVENOUS | Status: AC
  Administered 2024-11-21: 500 [IU] via INTRAVENOUS

## 2024-12-01 ENCOUNTER — Other Ambulatory Visit

## 2024-12-03 ENCOUNTER — Telehealth: Payer: Self-pay | Admitting: Radiation Therapy

## 2024-12-03 NOTE — Telephone Encounter (Signed)
 Left a detailed voicemail for Ms. Lanigan requesting a call back about her missed brain MRI. The scan was cancelled due to the inclement weather. The message included the contact number for [facility], requesting she reschedule when she is able to attend.   My contact information was also included with encouragement to call if she has questions or concerns. As soon as the MRI has been rescheduled, we will get her plugged back in with [facility] for a follow-up to review those results.   Devere Perch R.T.(R)(T) Radiation Special Procedures Lead

## 2024-12-08 ENCOUNTER — Inpatient Hospital Stay

## 2024-12-09 ENCOUNTER — Inpatient Hospital Stay: Admitting: Internal Medicine

## 2024-12-09 ENCOUNTER — Inpatient Hospital Stay: Attending: Internal Medicine | Admitting: Internal Medicine

## 2024-12-10 ENCOUNTER — Ambulatory Visit: Admitting: Urology

## 2025-01-09 ENCOUNTER — Other Ambulatory Visit

## 2025-01-12 ENCOUNTER — Inpatient Hospital Stay: Attending: Internal Medicine

## 2025-01-14 ENCOUNTER — Ambulatory Visit: Admitting: Urology

## 2025-02-05 ENCOUNTER — Encounter
